# Patient Record
Sex: Female | Born: 1960
Health system: Southern US, Community
[De-identification: ages and names within clinical notes are randomized; demographics above are authoritative.]

## PROBLEM LIST (undated history)

## (undated) DIAGNOSIS — R5383 Other fatigue: Secondary | ICD-10-CM

## (undated) DIAGNOSIS — F17200 Nicotine dependence, unspecified, uncomplicated: Secondary | ICD-10-CM

## (undated) DIAGNOSIS — R059 Cough, unspecified: Secondary | ICD-10-CM

## (undated) DIAGNOSIS — M549 Dorsalgia, unspecified: Secondary | ICD-10-CM

## (undated) DIAGNOSIS — T8859XA Other complications of anesthesia, initial encounter: Secondary | ICD-10-CM

## (undated) DIAGNOSIS — R5381 Other malaise: Secondary | ICD-10-CM

## (undated) DIAGNOSIS — J209 Acute bronchitis, unspecified: Secondary | ICD-10-CM

## (undated) DIAGNOSIS — D7589 Other specified diseases of blood and blood-forming organs: Secondary | ICD-10-CM

## (undated) DIAGNOSIS — G8929 Other chronic pain: Secondary | ICD-10-CM

## (undated) DIAGNOSIS — G4700 Insomnia, unspecified: Secondary | ICD-10-CM

## (undated) DIAGNOSIS — R05 Cough: Secondary | ICD-10-CM

## (undated) DIAGNOSIS — R87810 Cervical high risk human papillomavirus (HPV) DNA test positive: Secondary | ICD-10-CM

## (undated) DIAGNOSIS — T4145XA Adverse effect of unspecified anesthetic, initial encounter: Secondary | ICD-10-CM

## (undated) HISTORY — PX: FRACTURE SURGERY: SHX138

## (undated) HISTORY — DX: Other malaise: R53.81

## (undated) HISTORY — DX: Other specified diseases of blood and blood-forming organs: D75.89

## (undated) HISTORY — DX: Cervical high risk human papillomavirus (HPV) DNA test positive: R87.810

## (undated) HISTORY — DX: Other fatigue: R53.83

## (undated) HISTORY — DX: Cough: R05

## (undated) HISTORY — DX: Cough, unspecified: R05.9

## (undated) HISTORY — DX: Insomnia, unspecified: G47.00

## (undated) HISTORY — PX: TONSILLECTOMY: SUR1361

## (undated) HISTORY — DX: Nicotine dependence, unspecified, uncomplicated: F17.200

## (undated) HISTORY — DX: Acute bronchitis, unspecified: J20.9

---

## 2006-01-21 ENCOUNTER — Emergency Department (HOSPITAL_COMMUNITY): Admission: EM | Admit: 2006-01-21 | Discharge: 2006-01-22 | Payer: Self-pay | Admitting: Emergency Medicine

## 2006-01-22 IMAGING — CR DG FOOT COMPLETE 3+V*L*
3 series · 3 of 3 positions shown · non-contrast
Comparison: none

CLINICAL DATA: Dropped table on top of foot.
LEFT [MN] VIEW:

[view not recorded (1 of 3)]
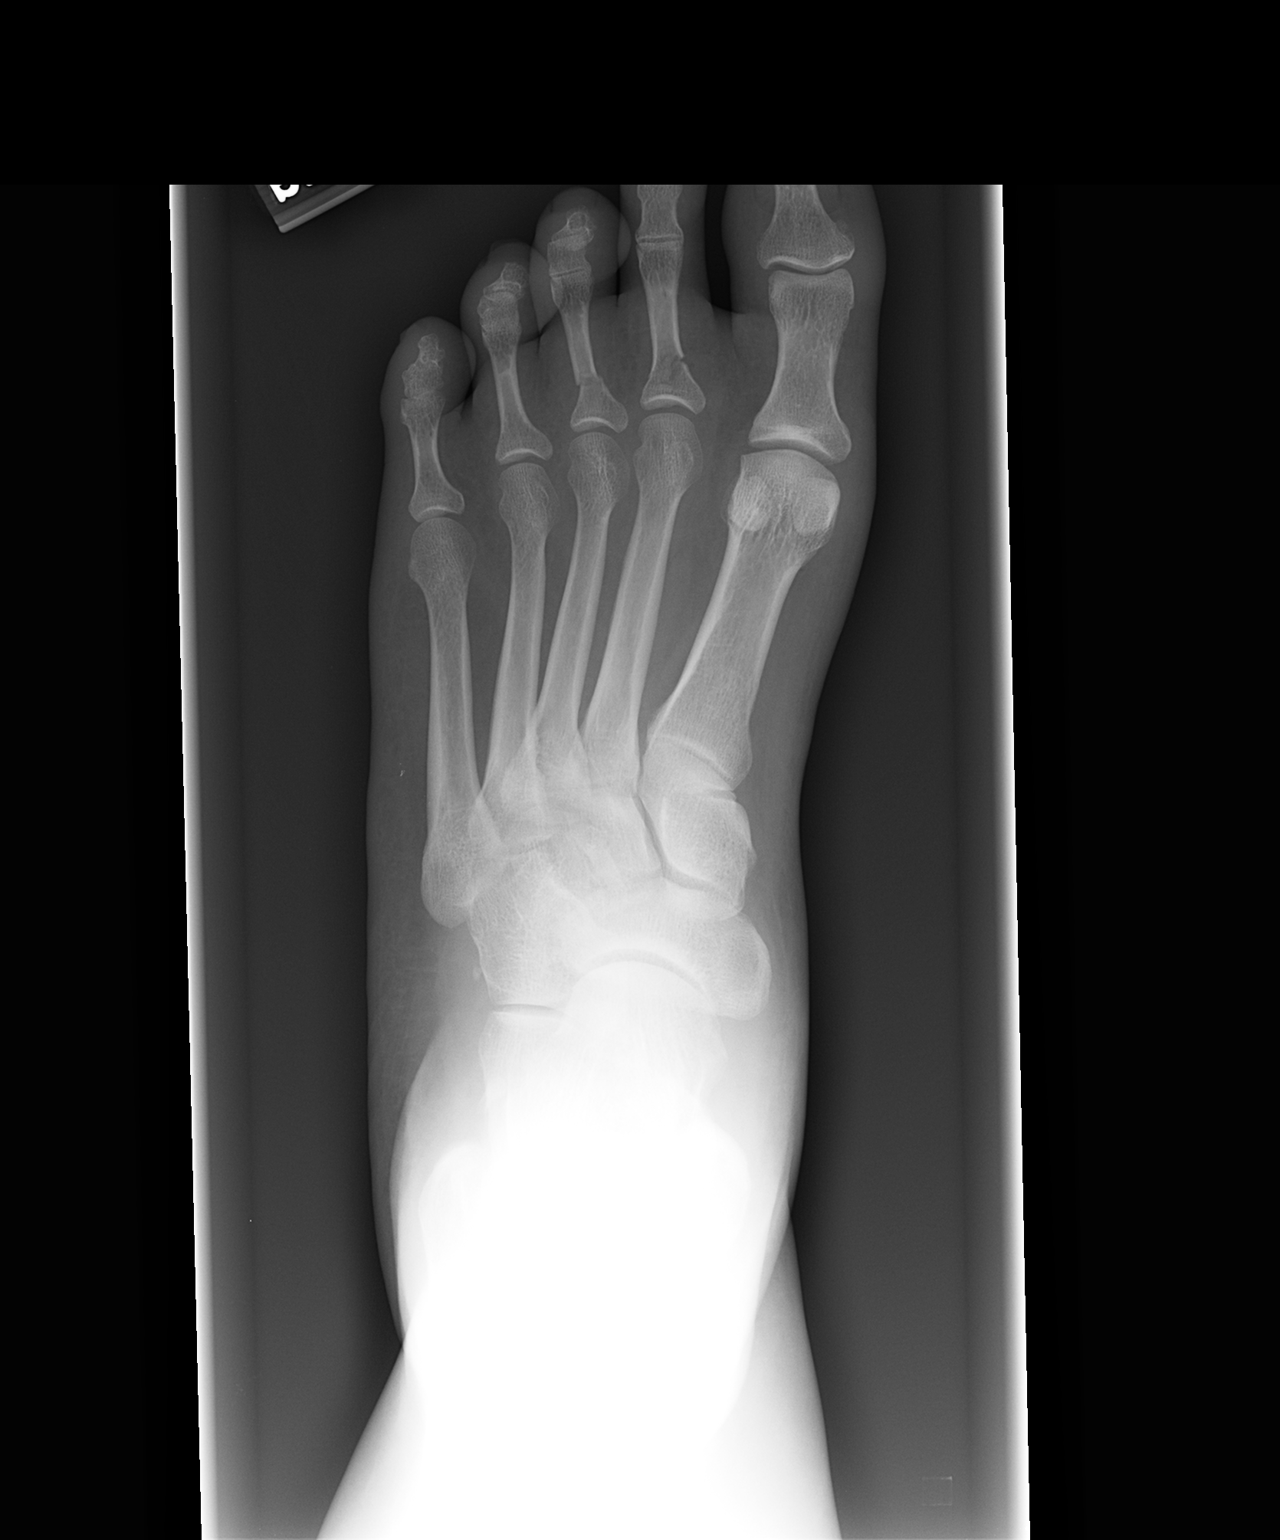

[view not recorded (2 of 3)]
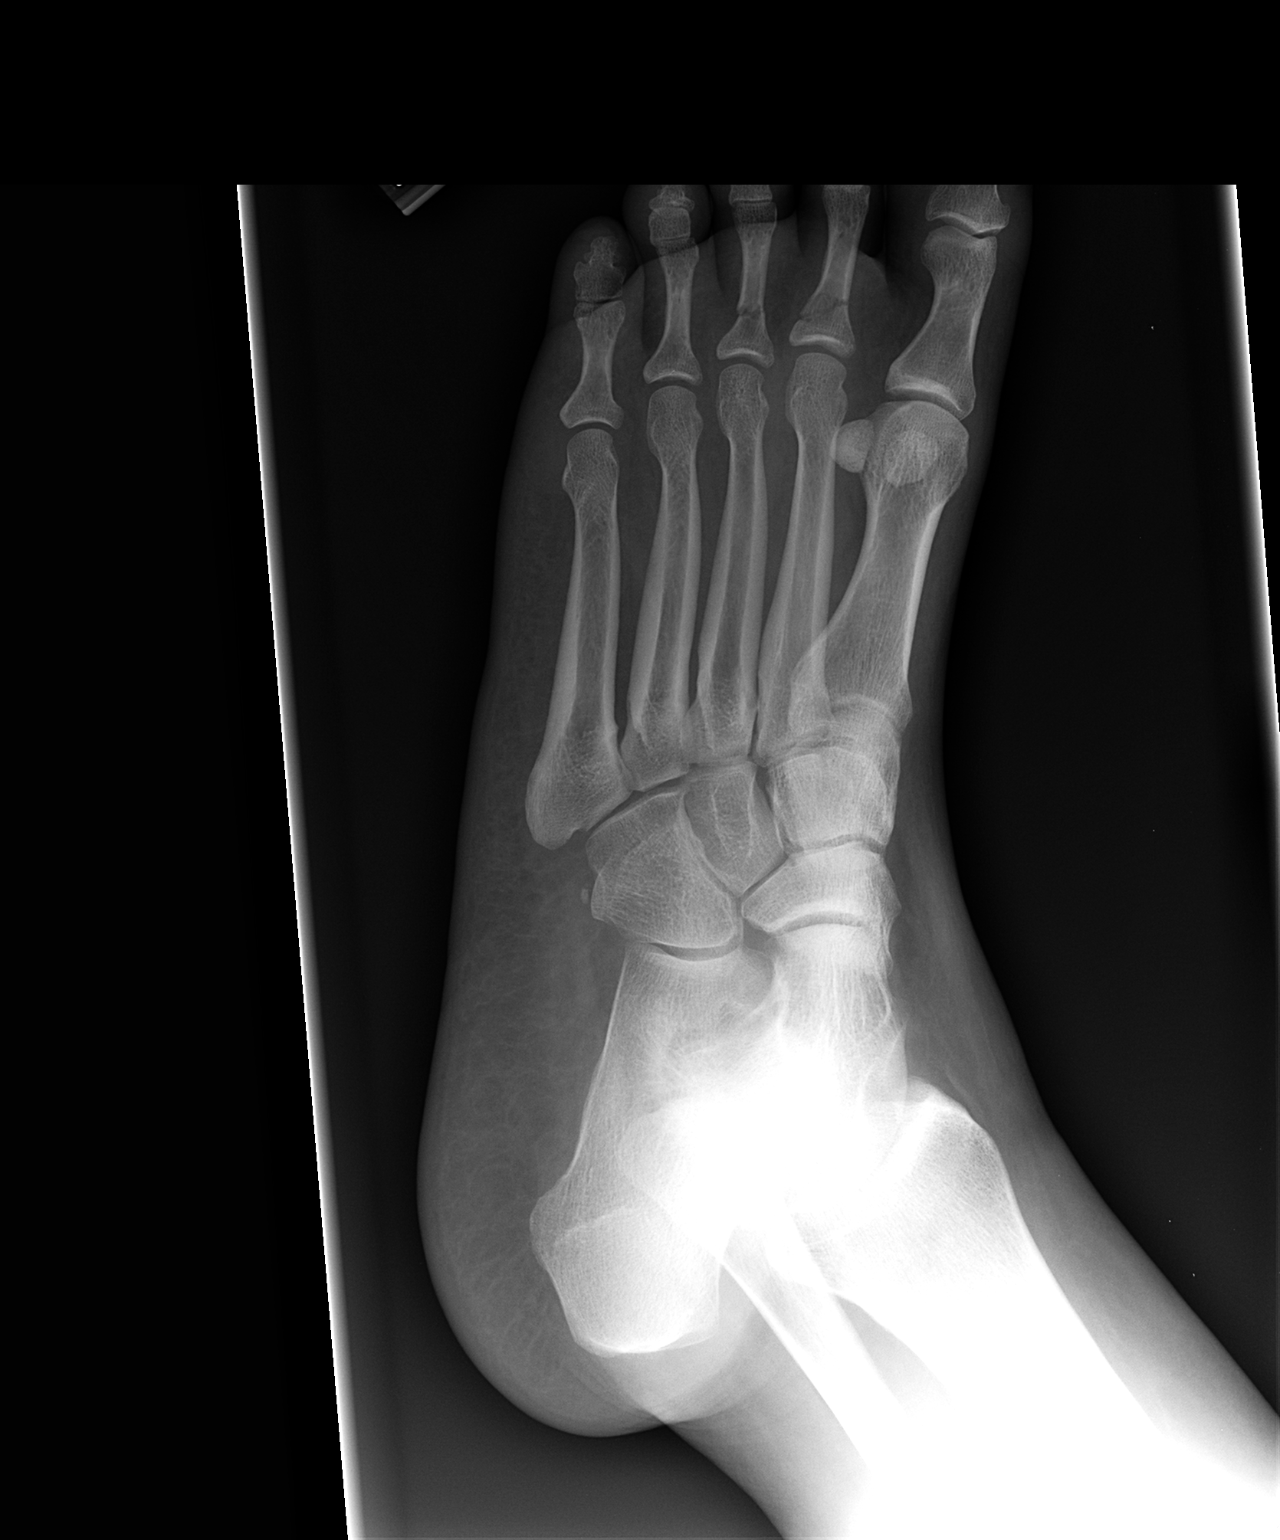

[view not recorded (3 of 3)]
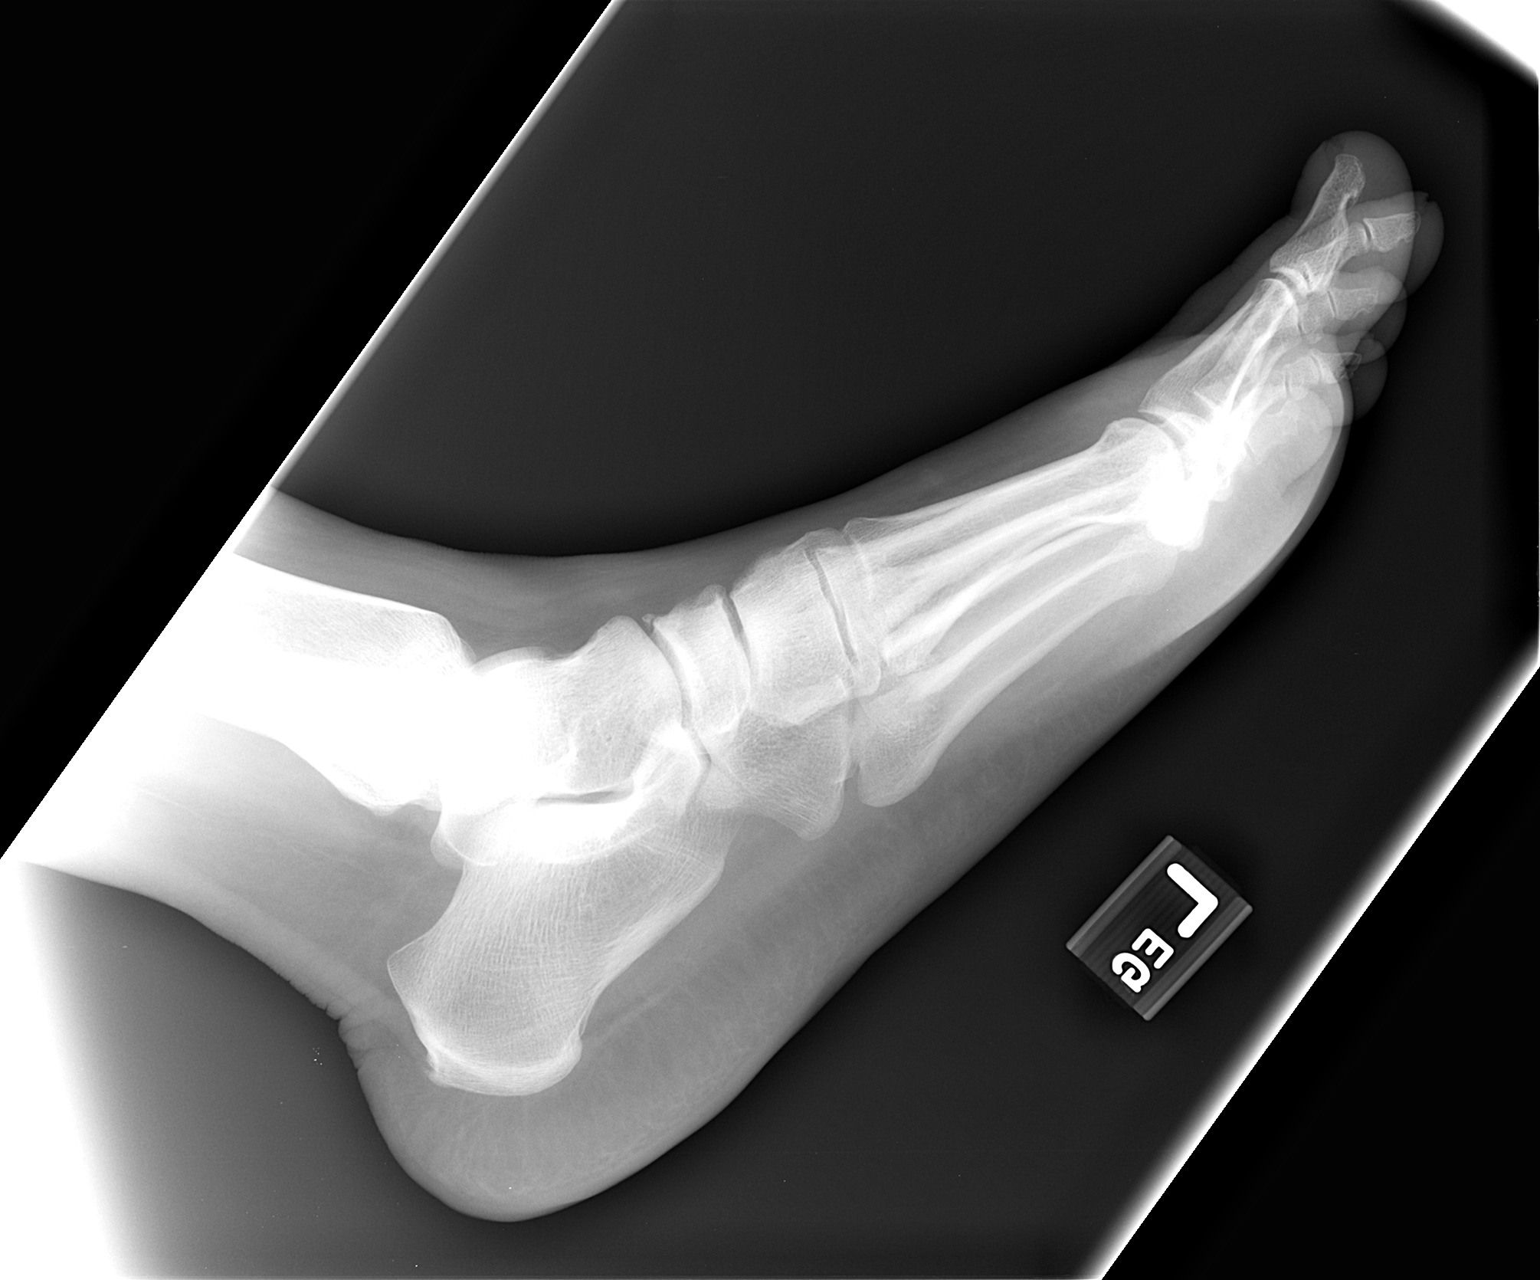

[3 of 3 positions shown; findings below may reference images not displayed]

FINDINGS: There are nondisplaced comminuted fractures through the base of the 2nd and 3rd proximal phalanges.
No additional fractures or dislocations are identified.
IMPRESSION: Proximal 2nd and 3rd phalangeal fractures.

## 2008-04-12 IMAGING — CR DG KNEE COMPLETE 4+V*R*
4 series · 4 of 4 positions shown · non-contrast
Comparison: None

CLINICAL DATA: Fell week ago with pain

RIGHT KNEE - COMPLETE 4+ VIEW

[t knee ap right]
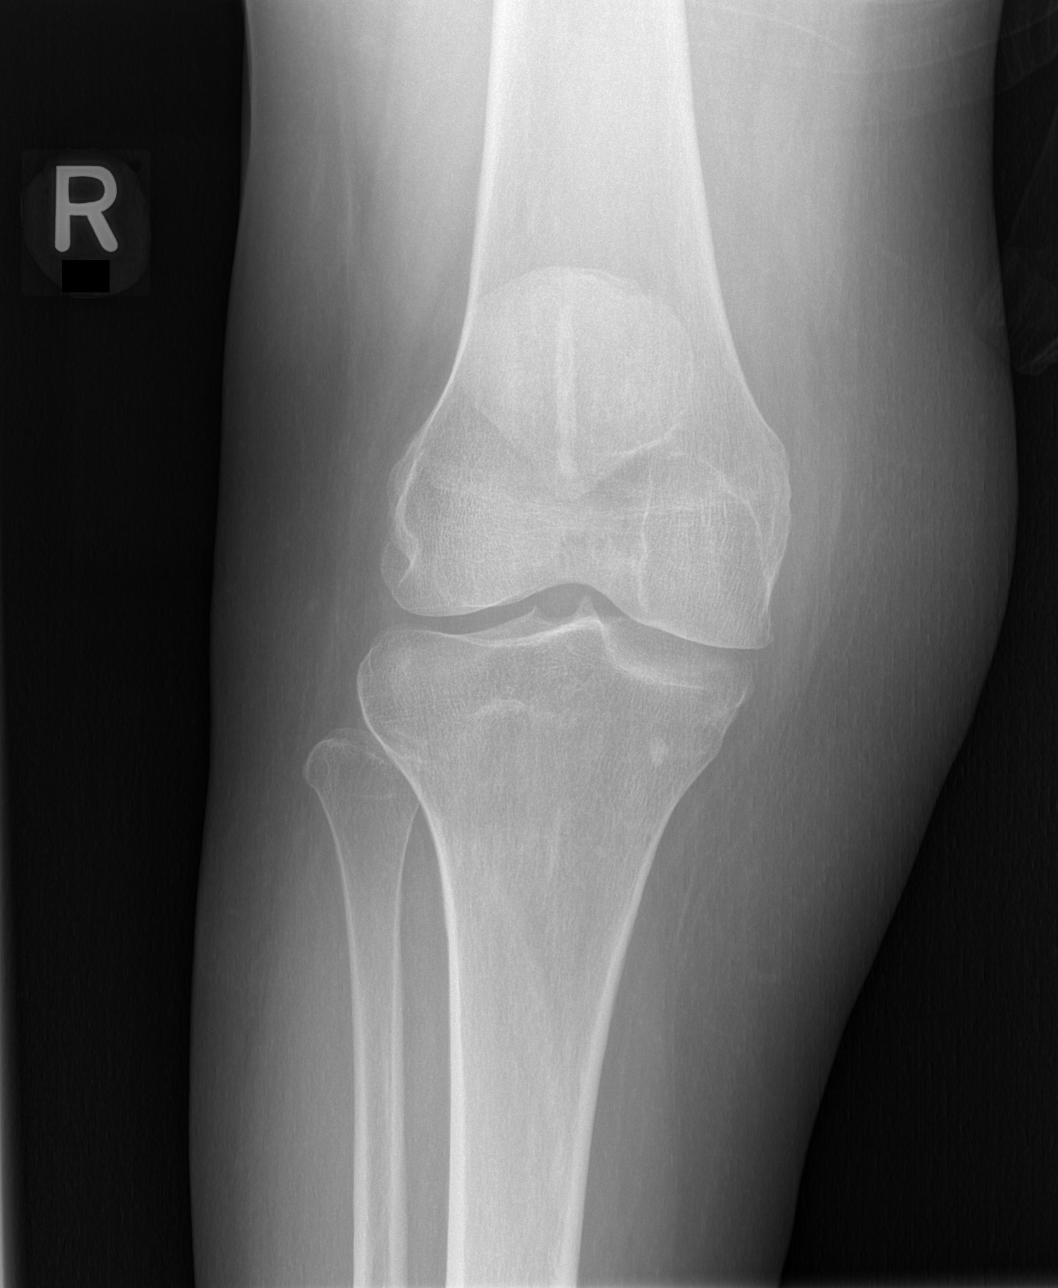

[t knee oblique right (1 of 2)]
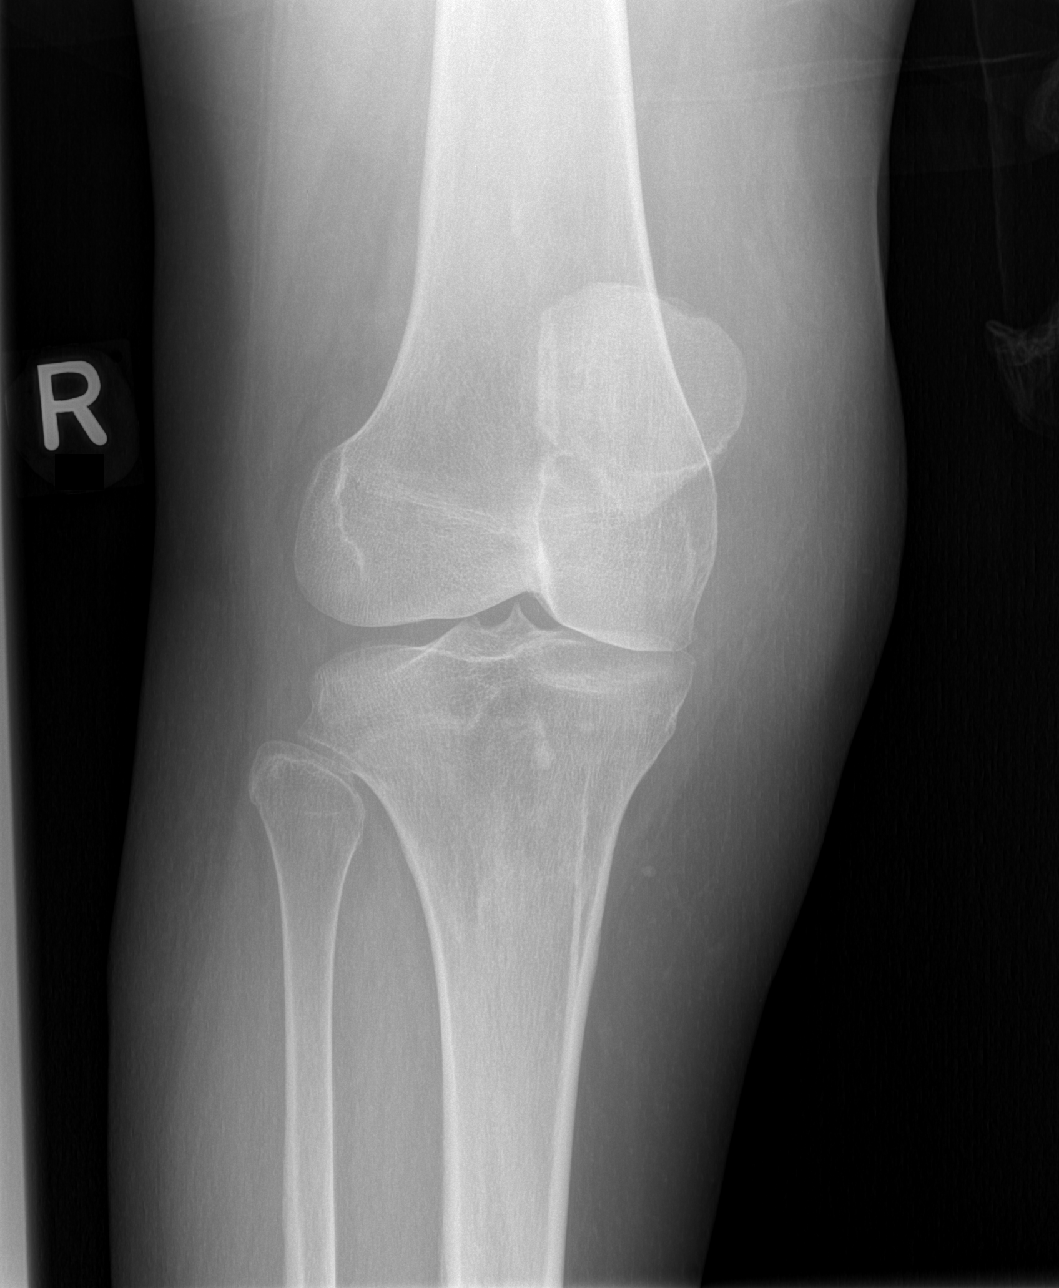

[t knee oblique right (2 of 2)]
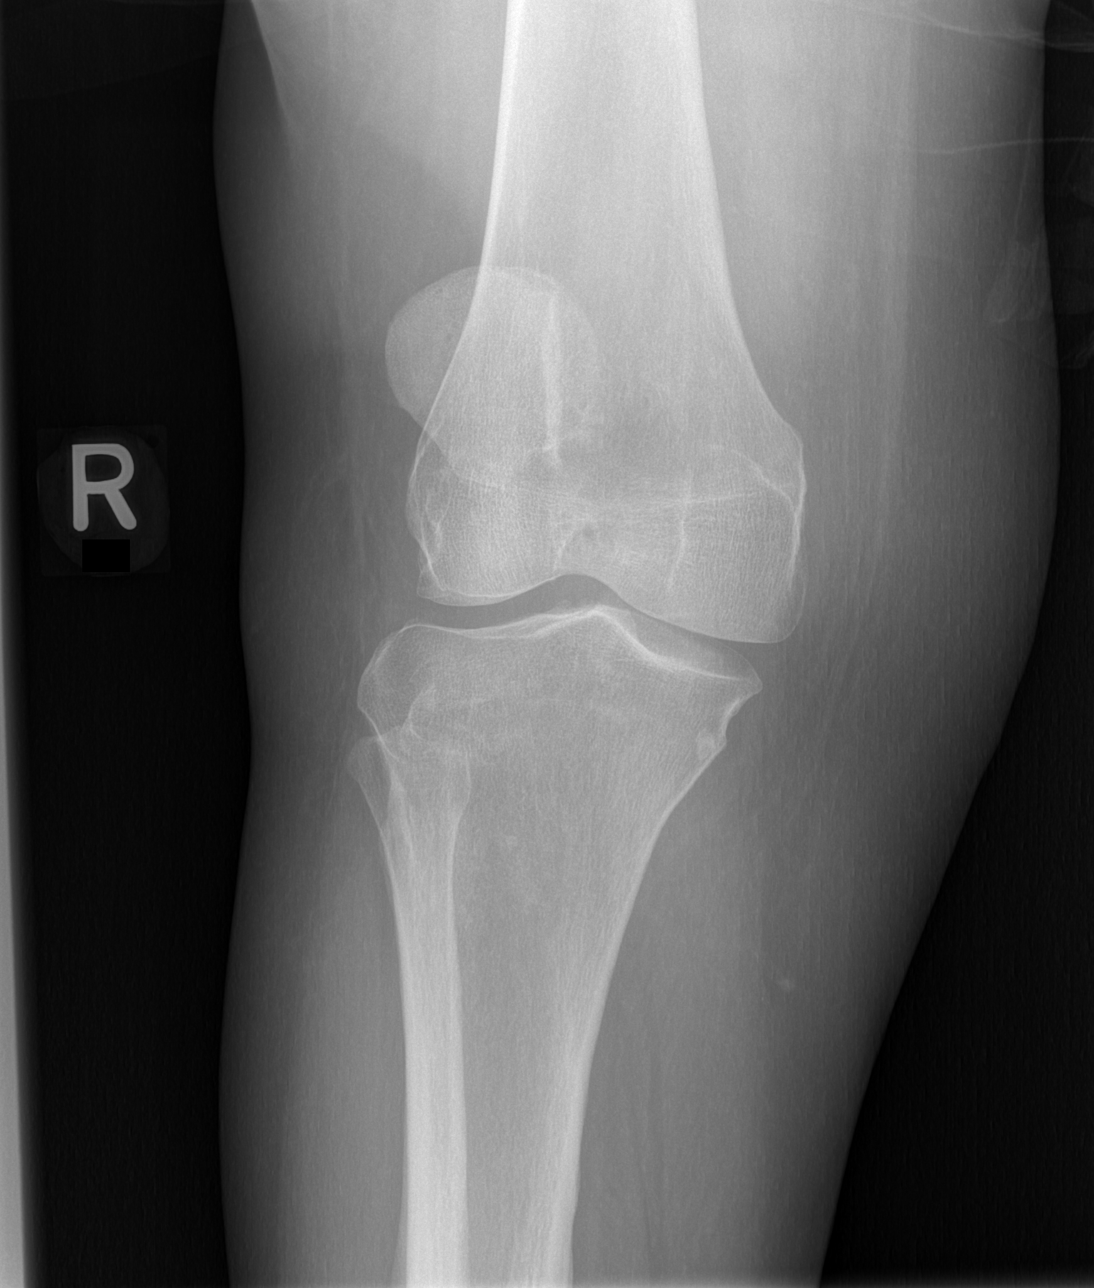

[t knee lat right]
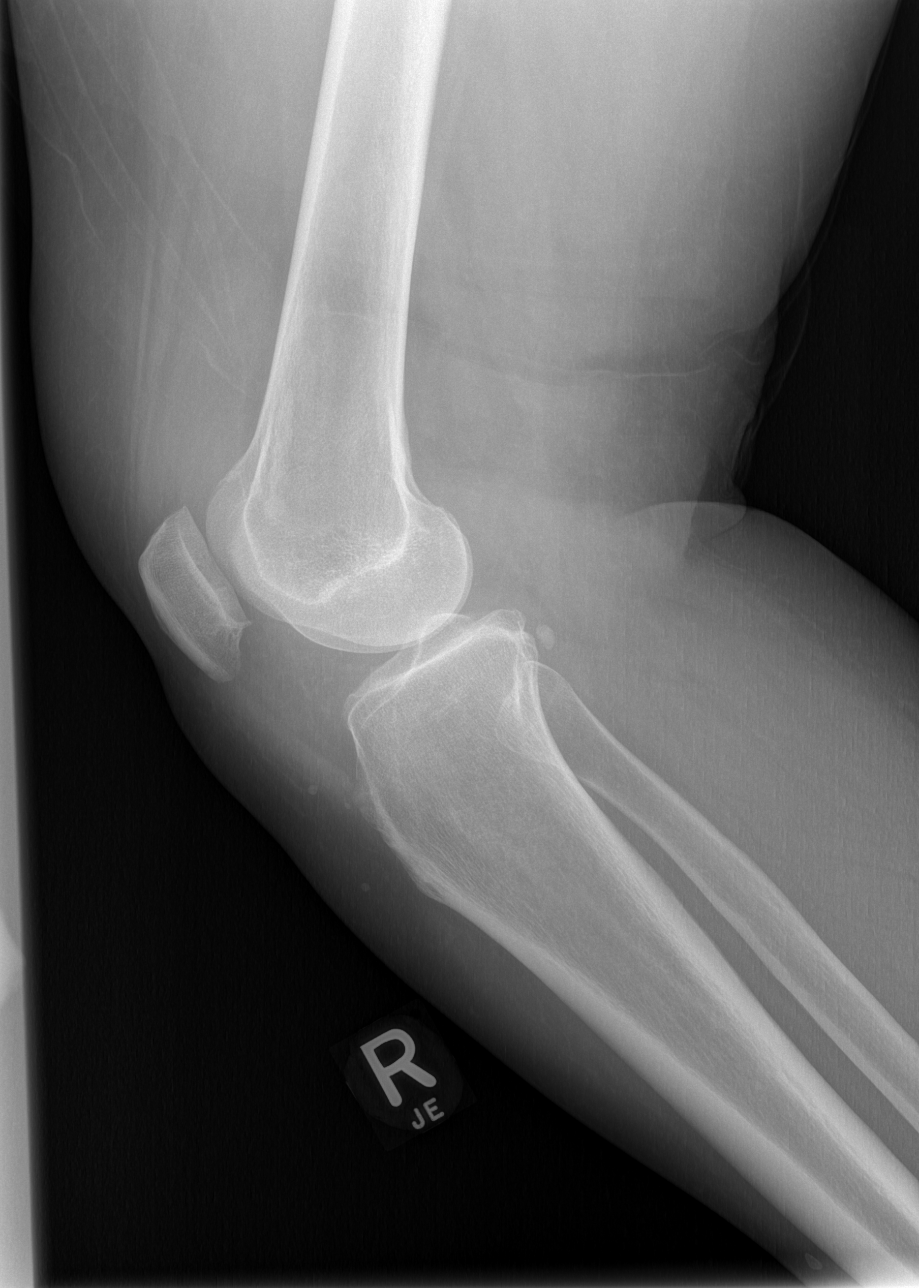

[4 of 4 positions shown; findings below may reference images not displayed]

FINDINGS: No acute fracture is seen.  Joint spaces are within
normal limits.  However there is a moderate sized knee joint
effusion present.
IMPRESSION: No fracture.  Moderate knee joint effusion.

## 2008-09-18 ENCOUNTER — Emergency Department (HOSPITAL_COMMUNITY): Admission: EM | Admit: 2008-09-18 | Discharge: 2008-09-18 | Payer: Self-pay | Admitting: Emergency Medicine

## 2011-05-11 ENCOUNTER — Encounter (HOSPITAL_COMMUNITY): Payer: Self-pay | Admitting: Emergency Medicine

## 2011-05-11 ENCOUNTER — Emergency Department (HOSPITAL_COMMUNITY)
Admission: EM | Admit: 2011-05-11 | Discharge: 2011-05-11 | Disposition: A | Discharge status: Home / Self Care | Payer: Medicare Other | Attending: Emergency Medicine | Admitting: Emergency Medicine

## 2011-05-11 DIAGNOSIS — R059 Cough, unspecified: Secondary | ICD-10-CM | POA: Insufficient documentation

## 2011-05-11 DIAGNOSIS — M549 Dorsalgia, unspecified: Secondary | ICD-10-CM

## 2011-05-11 DIAGNOSIS — R05 Cough: Secondary | ICD-10-CM | POA: Insufficient documentation

## 2011-05-11 DIAGNOSIS — J40 Bronchitis, not specified as acute or chronic: Secondary | ICD-10-CM | POA: Insufficient documentation

## 2011-05-11 HISTORY — DX: Other chronic pain: G89.29

## 2011-05-11 HISTORY — DX: Dorsalgia, unspecified: M54.9

## 2011-05-11 MED ORDER — AZITHROMYCIN 250 MG PO TABS: 250.0000 mg | ORAL_TABLET | Freq: Every day | ORAL | Status: AC

## 2011-05-11 MED ORDER — OXYCODONE-ACETAMINOPHEN 5-325 MG PO TABS
1.0000 | ORAL_TABLET | Freq: Once | ORAL | Status: AC
  Administered 2011-05-11: 1 via ORAL
  Filled 2011-05-11: qty 1

## 2011-05-11 MED ORDER — ETODOLAC 500 MG PO TABS: 500.0000 mg | ORAL_TABLET | Freq: Two times a day (BID) | ORAL | Status: DC

## 2011-05-11 NOTE — ED Notes (Signed)
Pt c/o pain in lower back pain, st's she has had pain for several months.  Has been getting injections at ortho office and also pain meds.  But can't see them again til she has co pay.  Pt st's pain meds were not helping.

## 2011-05-11 NOTE — ED Notes (Signed)
Received pt. From Triage, pt. Ambulatory with cane, gait steady, NAD noted, pt. C/o lower back pain, chest pain with productive cough, green sputum reported by pt.

## 2011-05-11 NOTE — ED Notes (Signed)
Cardiac monitor applied.

## 2011-05-11 NOTE — ED Provider Notes (Signed)
History     CSN: [id number]  Arrival date & time 05/11/11  9562   First MD Initiated Contact with Patient 05/11/11 2231      Chief Complaint  Patient presents with  . Back Pain    (Consider location/radiation/quality/duration/timing/severity/associated sxs/prior treatment) HPI Patient presents emergent complaints of cough that has been ongoing for the last few days. Patient states she's been exposed to someone who has pneumonia. She's been bringing up some yellow sputum. It hurts in her chest when she coughs. She has not had a fever but she does smoke. Patient also mentioned she's been having chronic back pain that has been ongoing for months. She has been seeing another doctor for treatment but could not afford the most recent cocaine. Patient states Ultram is not helping her. She denies any numbness or weakness. No abdominal pain no shortness of breath. Past Medical History  Diagnosis Date  . Chronic back pain     Past Surgical History  Procedure Date  . Fracture surgery   . Tonsillectomy     No family history on file.  History  Substance Use Topics  . Smoking status: Current Everyday Smoker  . Smokeless tobacco: Not on file  . Alcohol Use: Yes     ocassional    OB History    Grav Para Term Preterm Abortions TAB SAB Ect Mult Living                  Review of Systems  All other systems reviewed and are negative.    Allergies  Aspirin and Penicillins  Home Medications   Current Outpatient Rx  Name Route Sig Dispense Refill  . TRAMADOL HCL 50 MG PO TABS Oral Take 50-100 mg by mouth every 4 (four) hours as needed. For pain      BP 130/79  Pulse 81  Temp(Src) 98.5 F (36.9 C) (Oral)  Resp 20  SpO2 98%  Physical Exam  Nursing note and vitals reviewed. Constitutional: She appears well-developed and well-nourished. No distress.  HENT:  Head: Normocephalic and atraumatic.  Right Ear: External ear normal.  Left Ear: External ear normal.  Eyes:  Conjunctivae are normal. Right eye exhibits no discharge. Left eye exhibits no discharge. No scleral icterus.  Neck: Neck supple. No tracheal deviation present.  Cardiovascular: Normal rate, regular rhythm and intact distal pulses.   Pulmonary/Chest: Effort normal. No stridor. No respiratory distress. She has wheezes. She has rales (few wheezes and crackles right base).  Abdominal: Soft. Bowel sounds are normal. She exhibits no distension. There is no tenderness. There is no rebound and no guarding.  Musculoskeletal: She exhibits no edema and no tenderness.  Neurological: She is alert. She has normal strength. No sensory deficit. Cranial nerve deficit:  no gross defecits noted. She exhibits normal muscle tone. She displays no seizure activity. Coordination normal.  Skin: Skin is warm and dry. No rash noted.  Psychiatric: She has a normal mood and affect.    ED Course  Procedures (including critical care time)  Labs Reviewed - No data to display No results found.    MDM  Patient appears in no distress. She has no hypoxia. She's not had any labored breathing. With the slight crackles on the right basal on a course of antibiotics for a presumptive acquired pneumonia. Regarding her back pain I encouraged her to followup with her doctors treating her. I will give her prescription for etodolac in the meantime      Celene Kras, MD  05/11/11 2234 

## 2011-11-02 ENCOUNTER — Encounter (HOSPITAL_COMMUNITY): Payer: Self-pay | Admitting: *Deleted

## 2011-11-02 ENCOUNTER — Emergency Department (HOSPITAL_COMMUNITY)
Admission: EM | Admit: 2011-11-02 | Discharge: 2011-11-02 | Disposition: A | Discharge status: Home Health | Payer: Medicare Other | Attending: Emergency Medicine | Admitting: Emergency Medicine

## 2011-11-02 DIAGNOSIS — F17200 Nicotine dependence, unspecified, uncomplicated: Secondary | ICD-10-CM | POA: Insufficient documentation

## 2011-11-02 DIAGNOSIS — Z9889 Other specified postprocedural states: Secondary | ICD-10-CM | POA: Insufficient documentation

## 2011-11-02 DIAGNOSIS — M25539 Pain in unspecified wrist: Secondary | ICD-10-CM

## 2011-11-02 DIAGNOSIS — Z760 Encounter for issue of repeat prescription: Secondary | ICD-10-CM | POA: Insufficient documentation

## 2011-11-02 MED ORDER — HYDROCODONE-ACETAMINOPHEN 5-500 MG PO TABS: 1.0000 | ORAL_TABLET | Freq: Four times a day (QID) | ORAL | Status: AC | PRN

## 2011-11-02 NOTE — ED Notes (Signed)
Pt is here for medication refill of percocet. Pt had wrist surgery approx 4 weeks ago and does not have followup till the 29th.

## 2011-11-02 NOTE — ED Provider Notes (Signed)
History    This chart was scribed for Laura Roots, MD, MD by Smitty Pluck. The patient was seen in room TR10C and the patient's care was started at 3:17PM.   CSN: [id number]  Arrival date & time 11/02/11  1432   None     Chief Complaint  Patient presents with  . Medication Refill    (Consider location/radiation/quality/duration/timing/severity/associated sxs/prior treatment) The history is provided by the patient.   Laura [name] is a 51 y.o. female who presents to the Emergency Department complaining of medication refill due to running out of medication after left wrist surgery. Pt reports he had surgery 4 weeks ago. He still has constant moderate pain in left wrist. He reports he does not have follow up until July 29. He is right hand dominant. Denies any other pain. Denies radiation. No numbness/weakness. No redness. No fevers. No reinjury or fall.   Past Medical History  Diagnosis Date  . Chronic back pain     Past Surgical History  Procedure Date  . Fracture surgery   . Tonsillectomy     History reviewed. No pertinent family history.  History  Substance Use Topics  . Smoking status: Current Everyday Smoker  . Smokeless tobacco: Not on file  . Alcohol Use: Yes     ocassional    OB History    Grav Para Term Preterm Abortions TAB SAB Ect Mult Living                  Review of Systems  Constitutional: Negative for fever and chills.  Respiratory: Negative for shortness of breath.   Gastrointestinal: Negative for nausea and vomiting.  Neurological: Negative for weakness.    Allergies  Aspirin and Penicillins  Home Medications  No current outpatient prescriptions on file.  BP 148/83  Pulse 99  Temp 98.7 F (37.1 C) (Oral)  Resp 17  SpO2 100%  Physical Exam  Nursing note and vitals reviewed. Constitutional: She is oriented to person, place, and time. She appears well-developed and well-nourished. No distress.  HENT:  Head: Normocephalic and  atraumatic.  Eyes: Conjunctivae are normal.  Neck: Neck supple. No tracheal deviation present.  Cardiovascular: Normal rate.   Pulmonary/Chest: Effort normal. No respiratory distress.  Musculoskeletal: Normal range of motion.       Left wrist w healing surgical wound without sign of infection. Radial pulse 2+. No skin changes or erythema. No sts.   Neurological: She is alert and oriented to person, place, and time.  Skin: Skin is warm and dry.  Psychiatric: She has a normal mood and affect. Her behavior is normal.    ED Course  Procedures (including critical care time) DIAGNOSTIC STUDIES: Oxygen Saturation is 100% on room air, normal by my interpretation.    COORDINATION OF CARE:      MDM  I personally performed the services described in this documentation, which was scribed in my presence. The recorded information has been reviewed and considered. Laura Roots, MD    pts surgical wounds healing without sign of infection.  rx vicodin, discussed need for close f/u w her doctor, incl should she need further med refill.        Laura Roots, MD 11/02/11 [phone]

## 2011-11-02 NOTE — ED Notes (Signed)
Patient states out of pain medication post surgery.  Patient states next appointment with primary MD in August.  Patient with prescription filled on 10/23/11, 40 tablets of Percocet.

## 2012-06-14 ENCOUNTER — Encounter (HOSPITAL_COMMUNITY): Payer: Self-pay | Admitting: Nurse Practitioner

## 2012-06-14 ENCOUNTER — Emergency Department (HOSPITAL_COMMUNITY)
Admission: EM | Admit: 2012-06-14 | Discharge: 2012-06-14 | Disposition: A | Discharge status: Home / Self Care | Payer: Medicare Other | Attending: Emergency Medicine | Admitting: Emergency Medicine

## 2012-06-14 DIAGNOSIS — R5381 Other malaise: Secondary | ICD-10-CM | POA: Insufficient documentation

## 2012-06-14 DIAGNOSIS — G8929 Other chronic pain: Secondary | ICD-10-CM | POA: Insufficient documentation

## 2012-06-14 DIAGNOSIS — F17200 Nicotine dependence, unspecified, uncomplicated: Secondary | ICD-10-CM | POA: Insufficient documentation

## 2012-06-14 DIAGNOSIS — R209 Unspecified disturbances of skin sensation: Secondary | ICD-10-CM | POA: Insufficient documentation

## 2012-06-14 DIAGNOSIS — R202 Paresthesia of skin: Secondary | ICD-10-CM

## 2012-06-14 DIAGNOSIS — F1210 Cannabis abuse, uncomplicated: Secondary | ICD-10-CM

## 2012-06-14 DIAGNOSIS — M549 Dorsalgia, unspecified: Secondary | ICD-10-CM | POA: Insufficient documentation

## 2012-06-14 LAB — COMPREHENSIVE METABOLIC PANEL
Alkaline Phosphatase: 108 U/L (ref 39–117)
BUN: 10 mg/dL (ref 6–23)
CO2: 26 mEq/L (ref 19–32)
Chloride: 99 mEq/L (ref 96–112)
GFR calc Af Amer: >90 mL/min (ref >90)
GFR calc non Af Amer: >90 mL/min (ref >90)
Glucose, Bld: 91 mg/dL (ref 70–99)
Potassium: 4.9 mEq/L (ref 3.5–5.1)
Total Bilirubin: 0.9 mg/dL (ref 0.3–1.2)

## 2012-06-14 LAB — CBC WITH DIFFERENTIAL/PLATELET
Eosinophils Absolute: 0.1 10*3/uL (ref 0.0–0.7)
Hemoglobin: 15.1 g/dL — ABNORMAL HIGH (ref 12.0–15.0)
Lymphs Abs: 1.5 10*3/uL (ref 0.7–4.0)
MCH: 36.3 pg — ABNORMAL HIGH (ref 26.0–34.0)
Monocytes Relative: 7 % (ref 3–12)
Neutro Abs: 2.9 10*3/uL (ref 1.7–7.7)
Neutrophils Relative %: 60 % (ref 43–77)
RBC: 4.16 MIL/uL (ref 3.87–5.11)

## 2012-06-14 LAB — URINALYSIS, MICROSCOPIC ONLY
Bilirubin Urine: NEGATIVE
Ketones, ur: NEGATIVE mg/dL
Nitrite: NEGATIVE
Urobilinogen, UA: 1 mg/dL (ref 0.0–1.0)

## 2012-06-14 LAB — LIPASE, BLOOD: Lipase: 23 U/L (ref 11–59)

## 2012-06-14 LAB — RAPID URINE DRUG SCREEN, HOSP PERFORMED
Barbiturates: NOT DETECTED
Benzodiazepines: NOT DETECTED
Cocaine: NOT DETECTED
Opiates: NOT DETECTED
Tetrahydrocannabinol: POSITIVE — AB

## 2012-06-14 MED ORDER — SODIUM CHLORIDE 0.9 % IV BOLUS (SEPSIS)
1000.0000 mL | Freq: Once | INTRAVENOUS | Status: AC
  Administered 2012-06-14: 1000 mL via INTRAVENOUS

## 2012-06-14 NOTE — ED Notes (Signed)
Per ems: Pt c/o feel fatigue and diarrhea for 2 days. En route, A&Ox4, vss.

## 2012-06-14 NOTE — ED Provider Notes (Signed)
History  This chart was scribed for Loren Racer, MD by Shari Heritage, ED Scribe. The patient was seen in room CD05C/CD05C. Patient's care was started at 1558.   CSN: [id number]  Arrival date & time 06/14/12  1328   First MD Initiated Contact with Patient 06/14/12 1558      Chief Complaint  Patient presents with  . Tingling    Patient is a 52 y.o. female presenting with neurologic complaint. The history is provided by the patient. No language interpreter was used.  Neurologic Problem The primary symptoms include paresthesias. The symptoms began 2 days ago. The symptoms are unchanged. Context: unknown.  Paresthesias began greater than 24 hours ago. The paresthesias are unchanged. The paresthesias are described as pricking.    HPI Comments: Charise Leinbach is a 52 y.o. female who presents to the Emergency Department complaining of diffuse paresthesias onset 2 days ago. Patient states that yesterday morning she began to feel like she was "floating" and that she had "needles sticking" her all over her body including face. She says that she feels like she has been "smoking weed," but denies any recent marijuana use. Patient denies cough. She reports no other symptoms at this time.  She has a history of chronic back pain, but no other pertinent medical history. Per medical records, patient was recently written for Vicodin 5-325 mg. Patient is a current every day smoker.    Past Medical History  Diagnosis Date  . Chronic back pain     Past Surgical History  Procedure Laterality Date  . Fracture surgery    . Tonsillectomy      History reviewed. No pertinent family history.  History  Substance Use Topics  . Smoking status: Current Every Day Smoker  . Smokeless tobacco: Not on file  . Alcohol Use: Yes     Comment: ocassional    OB History   Grav Para Term Preterm Abortions TAB SAB Ect Mult Living                  Review of Systems  Constitutional: Positive for fatigue.   Neurological: Positive for paresthesias.  All other systems reviewed and are negative.    Allergies  Aspirin and Penicillins  Home Medications  No current outpatient prescriptions on file.  Triage Vitals: BP 161/93  Pulse 78  Temp(Src) 97.3 F (36.3 C) (Oral)  Resp 16  SpO2 97%  Physical Exam  Constitutional: She is oriented to person, place, and time. She appears well-developed and well-nourished.  HENT:  Head: Normocephalic and atraumatic.  Mouth/Throat: Mucous membranes are dry.  Mildly dry mucosa. Dry lips. Poor dentition.   Eyes: Conjunctivae and EOM are normal. Pupils are equal, round, and reactive to light.  Neck: Normal range of motion. Neck supple.  Cardiovascular: Normal rate, regular rhythm and normal heart sounds.   Pulmonary/Chest: Effort normal and breath sounds normal.  Abdominal: Soft.  Musculoskeletal: Normal range of motion.  Neurological: She is alert and oriented to person, place, and time.  No focal neurological deficits.   Skin: Skin is warm and dry. No rash noted.    ED Course  Procedures (including critical care time) DIAGNOSTIC STUDIES: Oxygen Saturation is 97% on room air, adequate by my interpretation.    COORDINATION OF CARE: 5:04 PM- Will give 1000 mL of IV fluids. Will order TSH, magnesium, drug screen, CBC with diff, CMP, lipase and UA. Patient informed of current plan for treatment and evaluation and agrees with plan at this time.  8:04 PM- Drug screen positive for tetrahydrocannabinol and suspect symptoms are related to marijuana use. Have also ordered TSH which is pending. Patient instructed to follow up with PCP for results.    Labs Reviewed  CBC WITH DIFFERENTIAL - Abnormal; Notable for the following:    Hemoglobin 15.1 (*)    MCV 102.4 (*)    MCH 36.3 (*)    Basophils Relative 2 (*)    All other components within normal limits  COMPREHENSIVE METABOLIC PANEL - Abnormal; Notable for the following:    Total Protein 8.7 (*)     All other components within normal limits  URINALYSIS, MICROSCOPIC ONLY - Abnormal; Notable for the following:    Leukocytes, UA TRACE (*)    Squamous Epithelial / LPF FEW (*)    All other components within normal limits  URINE RAPID DRUG SCREEN (HOSP PERFORMED) - Abnormal; Notable for the following:    Tetrahydrocannabinol POSITIVE (*)    All other components within normal limits  LIPASE, BLOOD  MAGNESIUM  TSH     1. Paresthesia   2. Marijuana abuse       MDM  I personally performed the services described in this documentation, which was scribed in my presence. The recorded information has been reviewed and is accurate.    Loren Racer, MD 06/15/12 [phone]

## 2012-06-14 NOTE — ED Notes (Signed)
Requested pt provide urine sample; however, pt states she is unable to void.

## 2012-07-28 ENCOUNTER — Encounter: Payer: Self-pay | Admitting: Geriatric Medicine

## 2012-07-28 ENCOUNTER — Ambulatory Visit (INDEPENDENT_AMBULATORY_CARE_PROVIDER_SITE_OTHER): Payer: Medicare Other | Admitting: Nurse Practitioner

## 2012-07-28 ENCOUNTER — Encounter: Payer: Self-pay | Admitting: Nurse Practitioner

## 2012-07-28 VITALS — BP 138/86 | HR 73 | Temp 98.3°F | Resp 15 | Ht 67.0 in | Wt 222.2 lb

## 2012-07-28 DIAGNOSIS — IMO0002 Reserved for concepts with insufficient information to code with codable children: Secondary | ICD-10-CM

## 2012-07-28 DIAGNOSIS — G4700 Insomnia, unspecified: Secondary | ICD-10-CM | POA: Insufficient documentation

## 2012-07-28 DIAGNOSIS — F32A Depression, unspecified: Secondary | ICD-10-CM

## 2012-07-28 DIAGNOSIS — F3289 Other specified depressive episodes: Secondary | ICD-10-CM

## 2012-07-28 DIAGNOSIS — Z13220 Encounter for screening for lipoid disorders: Secondary | ICD-10-CM

## 2012-07-28 DIAGNOSIS — M179 Osteoarthritis of knee, unspecified: Secondary | ICD-10-CM

## 2012-07-28 DIAGNOSIS — M1710 Unilateral primary osteoarthritis, unspecified knee: Secondary | ICD-10-CM

## 2012-07-28 DIAGNOSIS — F329 Major depressive disorder, single episode, unspecified: Secondary | ICD-10-CM

## 2012-07-28 MED ORDER — TRAZODONE HCL 50 MG PO TABS: 50.0000 mg | ORAL_TABLET | Freq: Every day | ORAL | Status: DC

## 2012-07-28 MED ORDER — HYDROCODONE-ACETAMINOPHEN 5-325 MG PO TABS: ORAL_TABLET | ORAL | Status: DC

## 2012-07-28 NOTE — Assessment & Plan Note (Signed)
Improved with recent increase of pain medication.

## 2012-07-28 NOTE — Assessment & Plan Note (Signed)
Unchanged-- Hopefully the start of trazodone will help with this- may need to titrate up to achieve therapeutic results for depression

## 2012-07-28 NOTE — Assessment & Plan Note (Signed)
Ambien helped pt get to sleep but not stay asleep will stop Ambien and start trazodone 50mg  qhs

## 2012-07-28 NOTE — Progress Notes (Signed)
Patient ID: Laura [name], female   DOB: 09/06/1960, 52 y.o.   MRN: [id number]   Allergies  Allergen Reactions  . Aspirin Hives  . Penicillins Hives    Chief Complaint  Patient presents with  . Medical Managment of Chronic Issues    HPI: Patient is a 52 y.o. female seen in the office today for follow up on pain and insomnia Recently started Ambien and increased Vicodin to 1-2 tablet every 6 hours She has been taking 2 tablets every 6 hours and reports the pain is still there. Reports it does make the pain more tolerable. Ortho said she was a good candidate for surgery but she does not wish to have this done.   Ambien is not keeping her asleep all night- she reports she is able to get to sleep better but not able to stay asleep. Has not tired any other medication to help with sleep.   Review of Systems:  Review of Systems  Constitutional: Negative for fever, chills and malaise/fatigue.  Respiratory: Negative for cough and shortness of breath.   Cardiovascular: Negative for chest pain and palpitations.  Gastrointestinal: Positive for constipation (but uses stool softners as needed). Negative for heartburn, nausea, vomiting and diarrhea.  Neurological: Positive for weakness (due to pain in knees ).  Psychiatric/Behavioral: Positive for depression (depressed at times). Negative for suicidal ideas. The patient is nervous/anxious (at times). The patient does not have insomnia.      Past Medical History  Diagnosis Date  . Chronic back pain   . Other specified diseases of blood and blood-forming organs   . Tobacco use disorder   . Insomnia, unspecified   . Acute bronchitis   . Other malaise and fatigue   . Cough    Past Surgical History  Procedure Laterality Date  . Fracture surgery    . Tonsillectomy     Social History:   reports that she has been smoking.  She does not have any smokeless tobacco history on file. She reports that  drinks alcohol. She reports that she does not use  illicit drugs.  Family History  Problem Relation Age of Onset  . Arthritis Mother   . Hypertension Mother   . Hypertension Sister   . Hypertension Brother   . Hypertension Sister   . Hypertension Sister   . Hypertension Sister     Medications: Patient's Medications  New Prescriptions   No medications on file  Previous Medications   ALBUTEROL (PROVENTIL HFA;VENTOLIN HFA) 108 (90 BASE) MCG/ACT INHALER    Inhale 2 puffs into the lungs every 6 (six) hours as needed for wheezing. For shortness of breath   HYDROCODONE-ACETAMINOPHEN (NORCO/VICODIN) 5-325 MG PER TABLET    Take 1 tablet by mouth every 6 (six) hours as needed for pain. For pain   ZOLPIDEM (AMBIEN) 10 MG TABLET    Take one tablet once at bedtime for insomnia  Modified Medications   No medications on file  Discontinued Medications   DOXYCYCLINE (VIBRA-TABS) 100 MG TABLET         Physical Exam: Physical Exam  Filed Vitals:   07/28/12 1104  BP: 138/86  Pulse: 73  Temp: 98.3 F (36.8 C)  TempSrc: Oral  Resp: 15  Height: 5\' 7"  (1.702 m)  Weight: 222 lb 3.2 oz (100.789 kg)      Labs reviewed: Basic Metabolic Panel:  Recent Labs  30/86/57 1607 06/14/12 1741  NA 138  --   K 4.9  --   CL 99  --  CO2 26  --   GLUCOSE 91  --   BUN 10  --   CREATININE 0.53  --   CALCIUM 10.2  --   MG  --  2.1  TSH  --  2.159   Liver Function Tests:  Recent Labs  06/14/12 1607  AST 35  ALT 20  ALKPHOS 108  BILITOT 0.9  PROT 8.7*  ALBUMIN 4.2    Recent Labs  06/14/12 1607  LIPASE 23  CBC:   Recent Labs  06/14/12 1607  WBC 4.9  NEUTROABS 2.9  HGB 15.1*  HCT 42.6  MCV 102.4*  PLT 178    Assessment/Plan No problem-specific assessment & plan notes found for this encounter.    Labs/tests ordered

## 2012-07-28 NOTE — Patient Instructions (Addendum)
Come to lab in 1 month for fasting lipids when you pick up Vicodin prescription next month     Insomnia Insomnia is frequent trouble falling and/or staying asleep. Insomnia can be a long term problem or a short term problem. Both are common. Insomnia can be a short term problem when the wakefulness is related to a certain stress or worry. Long term insomnia is often related to ongoing stress during waking hours and/or poor sleeping habits. Overtime, sleep deprivation itself can make the problem worse. Every little thing feels more severe because you are overtired and your ability to cope is decreased. CAUSES   Stress, anxiety, and depression.  Poor sleeping habits.  Distractions such as TV in the bedroom.  Naps close to bedtime.  Engaging in emotionally charged conversations before bed.  Technical reading before sleep.  Alcohol and other sedatives. They may make the problem worse. They can hurt normal sleep patterns and normal dream activity.  Stimulants such as caffeine for several hours prior to bedtime.  Pain syndromes and shortness of breath can cause insomnia.  Exercise late at night.  Changing time zones may cause sleeping problems (jet lag). It is sometimes helpful to have someone observe your sleeping patterns. They should look for periods of not breathing during the night (sleep apnea). They should also look to see how long those periods last. If you live alone or observers are uncertain, you can also be observed at a sleep clinic where your sleep patterns will be professionally monitored. Sleep apnea requires a checkup and treatment. Give your caregivers your medical history. Give your caregivers observations your family has made about your sleep.  SYMPTOMS   Not feeling rested in the morning.  Anxiety and restlessness at bedtime.  Difficulty falling and staying asleep. TREATMENT   Your caregiver may prescribe treatment for an underlying medical disorders. Your  caregiver can give advice or help if you are using alcohol or other drugs for self-medication. Treatment of underlying problems will usually eliminate insomnia problems.  Medications can be prescribed for short time use. They are generally not recommended for lengthy use.  Over-the-counter sleep medicines are not recommended for lengthy use. They can be habit forming.  You can promote easier sleeping by making lifestyle changes such as:  Using relaxation techniques that help with breathing and reduce muscle tension.  Exercising earlier in the day.  Changing your diet and the time of your last meal. No night time snacks.  Establish a regular time to go to bed.  Counseling can help with stressful problems and worry.  Soothing music and white noise may be helpful if there are background noises you cannot remove.  Stop tedious detailed work at least one hour before bedtime. HOME CARE INSTRUCTIONS   Keep a diary. Inform your caregiver about your progress. This includes any medication side effects. See your caregiver regularly. Take note of:  Times when you are asleep.  Times when you are awake during the night.  The quality of your sleep.  How you feel the next day. This information will help your caregiver care for you.  Get out of bed if you are still awake after 15 minutes. Read or do some quiet activity. Keep the lights down. Wait until you feel sleepy and go back to bed.  Keep regular sleeping and waking hours. Avoid naps.  Exercise regularly.  Avoid distractions at bedtime. Distractions include watching television or engaging in any intense or detailed activity like attempting to balance the household  checkbook.  Develop a bedtime ritual. Keep a familiar routine of bathing, brushing your teeth, climbing into bed at the same time each night, listening to soothing music. Routines increase the success of falling to sleep faster.  Use relaxation techniques. This can be using  breathing and muscle tension release routines. It can also include visualizing peaceful scenes. You can also help control troubling or intruding thoughts by keeping your mind occupied with boring or repetitive thoughts like the old concept of counting sheep. You can make it more creative like imagining planting one beautiful flower after another in your backyard garden.  During your day, work to eliminate stress. When this is not possible use some of the previous suggestions to help reduce the anxiety that accompanies stressful situations. MAKE SURE YOU:   Understand these instructions.  Will watch your condition.  Will get help right away if you are not doing well or get worse. Document Released: 04/03/2000 Document Revised: 06/29/2011 Document Reviewed: 05/04/2007 [facility] Patient Information 2013 [place], [place].

## 2012-08-30 ENCOUNTER — Other Ambulatory Visit: Payer: Medicare Other

## 2012-08-30 ENCOUNTER — Other Ambulatory Visit: Payer: Self-pay | Admitting: *Deleted

## 2012-08-30 DIAGNOSIS — Z13220 Encounter for screening for lipoid disorders: Secondary | ICD-10-CM

## 2012-08-30 MED ORDER — HYDROCODONE-ACETAMINOPHEN 5-325 MG PO TABS: ORAL_TABLET | ORAL | Status: DC

## 2012-08-31 LAB — LIPID PANEL
Chol/HDL Ratio: 1.8 ratio units (ref 0.0–4.4)
LDL Calculated: 61 mg/dL (ref 0–99)
Triglycerides: 67 mg/dL (ref 0–149)
VLDL Cholesterol Cal: 13 mg/dL (ref 5–40)

## 2012-09-05 ENCOUNTER — Ambulatory Visit (INDEPENDENT_AMBULATORY_CARE_PROVIDER_SITE_OTHER): Payer: Medicare Other | Admitting: Internal Medicine

## 2012-09-05 ENCOUNTER — Encounter: Payer: Self-pay | Admitting: Internal Medicine

## 2012-09-05 VITALS — BP 124/82 | HR 73 | Temp 98.1°F | Resp 20 | Ht 66.0 in | Wt 223.0 lb

## 2012-09-05 DIAGNOSIS — F410 Panic disorder [episodic paroxysmal anxiety] without agoraphobia: Secondary | ICD-10-CM

## 2012-09-05 MED ORDER — ALPRAZOLAM 0.25 MG PO TABS: 0.2500 mg | ORAL_TABLET | Freq: Every day | ORAL | Status: DC | PRN

## 2012-09-05 NOTE — Progress Notes (Signed)
Patient ID: Laura [name], female   DOB: 10/09/1960, 52 y.o.   MRN: [id number]  Allergies  Allergen Reactions  . Aspirin Hives  . Penicillins Hives    Chief Complaint  Patient presents with  . Acute Visit    anxiety attacks    HPI: Patient is a 52 y.o. AA female seen in the office today for an acute visit for increased anxiety. Having frequent panic attacks.  Has had to call rescue squad twice.  No clear trigger for them.  Heart beats so fast her veins on her hands are standing up.  Once at 4am, got chills and shakes out of nowhere.  Denies a lot of stress in her life at present.  Trazodone gave her nightmares--dreamt about dead people and felt like people were coming and sitting on the bed and standing up again.  Ambien makes her restless.  Took antidepressants a long time ago.  Also took anxiety pills before--took xanax.    Review of Systems:  Review of Systems  Constitutional: Positive for malaise/fatigue.  Respiratory: Negative for shortness of breath.   Cardiovascular: Negative for chest pain.  Musculoskeletal: Positive for back pain.  Neurological: Negative for weakness.  Psychiatric/Behavioral: Positive for depression. Negative for hallucinations, memory loss and substance abuse. The patient is nervous/anxious and has insomnia.      Past Medical History  Diagnosis Date  . Chronic back pain   . Other specified diseases of blood and blood-forming organs   . Tobacco use disorder   . Insomnia, unspecified   . Acute bronchitis   . Other malaise and fatigue   . Cough    Past Surgical History  Procedure Laterality Date  . Fracture surgery    . Tonsillectomy     Social History:   reports that she has been smoking.  She does not have any smokeless tobacco history on file. She reports that  drinks alcohol. She reports that she does not use illicit drugs.  Family History  Problem Relation Age of Onset  . Arthritis Mother   . Hypertension Mother   . Hypertension Sister   .  Hypertension Brother   . Hypertension Sister   . Hypertension Sister   . Hypertension Sister     Medications: Patient's Medications  New Prescriptions   ALPRAZOLAM (XANAX) 0.25 MG TABLET    Take 1 tablet (0.25 mg total) by mouth daily as needed for anxiety.  Previous Medications   ALBUTEROL (PROVENTIL HFA;VENTOLIN HFA) 108 (90 BASE) MCG/ACT INHALER    Inhale 2 puffs into the lungs every 6 (six) hours as needed for wheezing. For shortness of breath   HYDROCODONE-ACETAMINOPHEN (NORCO/VICODIN) 5-325 MG PER TABLET    1-2 tablets by mouth every 6 hours as needed for pain  Modified Medications   No medications on file  Discontinued Medications   TRAZODONE (DESYREL) 50 MG TABLET    Take 1 tablet (50 mg total) by mouth at bedtime.     Physical Exam:  Filed Vitals:   09/05/12 1616  BP: 124/82  Pulse: 73  Temp: 98.1 F (36.7 C)  TempSrc: Oral  Resp: 20  Height: 5\' 6"  (1.676 m)  Weight: 223 lb (101.152 kg)  SpO2: 95%  Physical Exam  Constitutional: She is oriented to person, place, and time.  Obese AA female, NAD  Cardiovascular: Normal rate, regular rhythm and normal heart sounds.   Pulmonary/Chest: Effort normal and breath sounds normal. No respiratory distress.  Neurological: She is alert and oriented to person, place, and  time.  Skin: Skin is warm and dry.  Psychiatric:  Flat affect, appears annoyed to wait   Assessment/Plan Panic attack Says she has had 2 panic attacks recently where she's had to call EMS.  Requests something to take in these instances.  Did not want an antidepressant to stave them off.  Discussed risks of benzodiazepines in long term on cognition, also discussed that they should not be combined with narcotics due to risk of overdose.  Lowest dose of xanax prescribed 0.25mg  daily prn panic attacks.  I only gave her 20 pills.  She says she will only take them if she gets a panic attack.

## 2012-09-05 NOTE — Assessment & Plan Note (Addendum)
Says she has had 2 panic attacks recently where she's had to call EMS.  Requests something to take in these instances.  Did not want an antidepressant to stave them off.  Discussed risks of benzodiazepines in long term on cognition, also discussed that they should not be combined with narcotics due to risk of overdose.  Lowest dose of xanax prescribed 0.25mg  daily prn panic attacks.  I only gave her 20 pills.  She says she will only take them if she gets a panic attack.

## 2012-10-03 ENCOUNTER — Other Ambulatory Visit: Payer: Self-pay | Admitting: Geriatric Medicine

## 2012-10-03 MED ORDER — HYDROCODONE-ACETAMINOPHEN 5-325 MG PO TABS: ORAL_TABLET | ORAL | Status: DC

## 2012-10-06 ENCOUNTER — Other Ambulatory Visit: Payer: Self-pay | Admitting: Geriatric Medicine

## 2012-10-06 DIAGNOSIS — F410 Panic disorder [episodic paroxysmal anxiety] without agoraphobia: Secondary | ICD-10-CM

## 2012-10-06 MED ORDER — ALPRAZOLAM 0.25 MG PO TABS: 0.2500 mg | ORAL_TABLET | Freq: Every day | ORAL | Status: DC | PRN

## 2012-11-02 ENCOUNTER — Other Ambulatory Visit: Payer: Self-pay | Admitting: Geriatric Medicine

## 2012-11-02 MED ORDER — HYDROCODONE-ACETAMINOPHEN 5-325 MG PO TABS: ORAL_TABLET | ORAL | Status: DC

## 2012-11-08 ENCOUNTER — Other Ambulatory Visit: Payer: Self-pay | Admitting: Geriatric Medicine

## 2012-11-08 DIAGNOSIS — F410 Panic disorder [episodic paroxysmal anxiety] without agoraphobia: Secondary | ICD-10-CM

## 2012-11-08 MED ORDER — ALPRAZOLAM 0.25 MG PO TABS: 0.2500 mg | ORAL_TABLET | Freq: Every day | ORAL | Status: DC | PRN

## 2012-11-10 ENCOUNTER — Ambulatory Visit: Payer: Medicare Other | Admitting: Nurse Practitioner

## 2012-11-10 ENCOUNTER — Encounter: Payer: Self-pay | Admitting: Internal Medicine

## 2012-11-10 ENCOUNTER — Ambulatory Visit (INDEPENDENT_AMBULATORY_CARE_PROVIDER_SITE_OTHER): Payer: Medicare Other | Admitting: Internal Medicine

## 2012-11-10 VITALS — BP 110/70 | HR 81 | Temp 97.9°F | Resp 18 | Ht 66.0 in | Wt 220.6 lb

## 2012-11-10 DIAGNOSIS — M5430 Sciatica, unspecified side: Secondary | ICD-10-CM

## 2012-11-10 DIAGNOSIS — M5441 Lumbago with sciatica, right side: Secondary | ICD-10-CM

## 2012-11-10 DIAGNOSIS — F410 Panic disorder [episodic paroxysmal anxiety] without agoraphobia: Secondary | ICD-10-CM

## 2012-11-10 MED ORDER — OXYCODONE-ACETAMINOPHEN 10-325 MG PO TABS: 1.0000 | ORAL_TABLET | Freq: Four times a day (QID) | ORAL | Status: DC | PRN

## 2012-11-10 MED ORDER — ALPRAZOLAM 0.5 MG PO TABS: 0.5000 mg | ORAL_TABLET | Freq: Every day | ORAL | Status: DC | PRN

## 2012-11-10 NOTE — Progress Notes (Signed)
Patient ID: Laura [name], female   DOB: [birth year], 52 y.o.   MRN: [id number] Location:  [facility]   Allergies  Allergen Reactions  . Aspirin Hives  . Penicillins Hives    Chief Complaint  Patient presents with  . Follow-up    HPI: Patient is a 52 y.o. black female seen in the office today for f/u of chronic conditions.  No new complaints.  Says she is here.   Says vicodin is not working that well since last time--is using 2 every 6 hours.  Says she can barely walk across the street due to pain.  Has to sit down on the park bench before she can go inside due to pain.  Severe pain.  Says the vicodin used to work well.  Pain is all the way across her low back.  Gets worse when she walks.  Gets to where right leg gets numb.  Says she's had several xrays, later had shots in back and they wanted to do surgery, and she did not want to have surgery b/c she lives alone and did not want to go to a nursing home for rehab after surgery.  Went to ortho just around the corner--[facility]--was Dr. Lajoyce Corners.  Has degenerative disc disease.  We need records.  Needing two xanax to stop her panic attacks.    Had left CTS, right ankle, right wrist.  Wants no more surgery.  Uses albuterol less than once a week.    Gets nightsweats.  Never had regular periods.  Says she only had a period a few times in her life.    Review of Systems:  Review of Systems  Constitutional: Negative for fever and chills.  HENT: Negative for congestion.   Eyes: Negative for blurred vision.  Respiratory: Negative for shortness of breath.   Cardiovascular: Negative for chest pain.  Gastrointestinal: Negative for heartburn, abdominal pain, constipation, blood in stool and melena.  Genitourinary: Negative for dysuria.       Has only had a few periods her whole life???  Does not have sex with men  Musculoskeletal: Positive for back pain and joint pain.  Skin: Negative for rash.   Neurological: Negative for dizziness.  Psychiatric/Behavioral: Positive for depression. The patient is nervous/anxious.        Panic attacks    Past Medical History  Diagnosis Date  . Chronic back pain   . Other specified diseases of blood and blood-forming organs(289.89)   . Tobacco use disorder   . Insomnia, unspecified   . Acute bronchitis   . Other malaise and fatigue   . Cough     Past Surgical History  Procedure Laterality Date  . Fracture surgery    . Tonsillectomy      Social History:   reports that she has been smoking.  She does not have any smokeless tobacco history on file. She reports that  drinks alcohol. She reports that she does not use illicit drugs.  Family History  Problem Relation Age of Onset  . Arthritis Mother   . Hypertension Mother   . Hypertension Sister   . Hypertension Brother   . Hypertension Sister   . Hypertension Sister   . Hypertension Sister     Medications: Patient's Medications  New Prescriptions   No medications on file  Previous Medications   ALBUTEROL (PROVENTIL HFA;VENTOLIN HFA) 108 (90 BASE) MCG/ACT INHALER    Inhale 2 puffs into the lungs every 6 (six) hours  as needed for wheezing. For shortness of breath   ALPRAZOLAM (XANAX) 0.25 MG TABLET    Take 1 tablet (0.25 mg total) by mouth daily as needed for anxiety.   HYDROCODONE-ACETAMINOPHEN (NORCO/VICODIN) 5-325 MG PER TABLET    1-2 tablets by mouth every 6 hours as needed for pain  Modified Medications   No medications on file  Discontinued Medications   No medications on file   Physical Exam: Filed Vitals:   11/10/12 1120  BP: 110/70  Pulse: 81  Temp: 97.9 F (36.6 C)  TempSrc: Oral  Resp: 18  Height: 5\' 6"  (1.676 m)  Weight: 220 lb 9.6 oz (100.064 kg)  SpO2: 98%  Physical Exam  Constitutional: She is oriented to person, place, and time. She appears well-developed and well-nourished.  Obese female  HENT:  Head: Normocephalic and atraumatic.  Cardiovascular:  Normal rate, regular rhythm, normal heart sounds and intact distal pulses.   Pulmonary/Chest: Effort normal and breath sounds normal. No respiratory distress.  Abdominal: Soft. Bowel sounds are normal.  Musculoskeletal: Normal range of motion. She exhibits tenderness.  Across sacroiliac joints  Neurological: She is alert and oriented to person, place, and time.  Skin: Skin is warm and dry.   Labs reviewed: Basic Metabolic Panel:  Recent Labs  16/10/96 1607 06/14/12 1741  NA 138  --   K 4.9  --   CL 99  --   CO2 26  --   GLUCOSE 91  --   BUN 10  --   CREATININE 0.53  --   CALCIUM 10.2  --   MG  --  2.1  TSH  --  2.159   Liver Function Tests:  Recent Labs  06/14/12 1607  AST 35  ALT 20  ALKPHOS 108  BILITOT 0.9  PROT 8.7*  ALBUMIN 4.2    Recent Labs  06/14/12 1607  LIPASE 23   CBC:  Recent Labs  06/14/12 1607  WBC 4.9  NEUTROABS 2.9  HGB 15.1*  HCT 42.6  MCV 102.4*  PLT 178   Lipid Panel:  Recent Labs  08/30/12 0840  HDL 92  LDLCALC 61  TRIG 67  CHOLHDL 1.8   Assessment/Plan 1. Panic attack -I increased the xanax from 0.25mg  to 0.5mg  - ALPRAZolam (XANAX) 0.5 MG tablet; Take 1 tablet (0.5 mg total) by mouth daily as needed for anxiety.  Dispense: 30 tablet; Refill: 3  2. Low back pain on right side with sciatica - oxyCODONE-acetaminophen (PERCOCET) 10-325 MG per tablet; Take 1 tablet by mouth every 6 (six) hours as needed for pain.  Dispense: 120 tablet; Refill: 0 - obtain records from Dr. Lajoyce Corners to determine causes of back pain--pt does not want surgery  Labs/tests ordered:  No additional today Next appt:  EV next visit

## 2012-11-13 ENCOUNTER — Encounter: Payer: Self-pay | Admitting: Internal Medicine

## 2012-11-29 ENCOUNTER — Ambulatory Visit (INDEPENDENT_AMBULATORY_CARE_PROVIDER_SITE_OTHER): Payer: Medicare Other | Admitting: Nurse Practitioner

## 2012-11-29 ENCOUNTER — Encounter: Payer: Self-pay | Admitting: Nurse Practitioner

## 2012-11-29 ENCOUNTER — Other Ambulatory Visit: Payer: Self-pay | Admitting: Geriatric Medicine

## 2012-11-29 VITALS — BP 138/80 | HR 88 | Temp 98.7°F | Resp 16 | Ht 66.0 in | Wt 215.8 lb

## 2012-11-29 DIAGNOSIS — B370 Candidal stomatitis: Secondary | ICD-10-CM

## 2012-11-29 MED ORDER — MAGIC MOUTHWASH W/LIDOCAINE: ORAL | Status: DC

## 2012-11-29 MED ORDER — FLUCONAZOLE 150 MG PO TABS: ORAL_TABLET | ORAL | Status: DC

## 2012-11-29 NOTE — Progress Notes (Signed)
Patient ID: Laura [name], female   DOB: Sep 26, 1960, 52 y.o.   MRN: [id number]   Allergies  Allergen Reactions  . Aspirin Hives  . Penicillins Hives    Chief Complaint  Patient presents with  . Acute Visit    used e-cig=mouth burning, lump in throat, unable to drink anything but water sips.    HPI: Patient is a 52 y.o. female seen in the office today for mouth hurting and burning and throat pain for 2-3 days. Started using e-cigs for 2 weeks and thinks this could be the cause. Stop using e-cigs but no improvement. No sores or open areas.  Can not eat or drink anything except for sips of water due to the pain.  Has gurgled salt water but did not help  No fevers or chills Review of Systems:  Review of Systems  Constitutional: Negative for fever, chills and malaise/fatigue.  HENT: Positive for sore throat.   Respiratory: Negative for cough, shortness of breath and wheezing. Stridor: see HPI.   Cardiovascular: Negative for chest pain and palpitations.  Skin: Negative.   Neurological: Negative for weakness.     Past Medical History  Diagnosis Date  . Chronic back pain   . Other specified diseases of blood and blood-forming organs(289.89)   . Tobacco use disorder   . Insomnia, unspecified   . Acute bronchitis   . Other malaise and fatigue   . Cough    Past Surgical History  Procedure Laterality Date  . Fracture surgery    . Tonsillectomy     Social History:   reports that she has been smoking.  She does not have any smokeless tobacco history on file. She reports that  drinks alcohol. She reports that she does not use illicit drugs.  Family History  Problem Relation Age of Onset  . Arthritis Mother   . Hypertension Mother   . Hypertension Sister   . Hypertension Brother   . Hypertension Sister   . Hypertension Sister   . Hypertension Sister     Medications: Patient's Medications  New Prescriptions   No medications on file  Previous Medications   ALBUTEROL  (PROVENTIL HFA;VENTOLIN HFA) 108 (90 BASE) MCG/ACT INHALER    Inhale 2 puffs into the lungs every 6 (six) hours as needed for wheezing. For shortness of breath   ALPRAZOLAM (XANAX) 0.5 MG TABLET    Take 1 tablet (0.5 mg total) by mouth daily as needed for anxiety.   HYDROCODONE-ACETAMINOPHEN (NORCO/VICODIN) 5-325 MG PER TABLET       OXYCODONE-ACETAMINOPHEN (PERCOCET) 10-325 MG PER TABLET    Take 1 tablet by mouth every 6 (six) hours as needed for pain.  Modified Medications   No medications on file  Discontinued Medications   No medications on file     Physical Exam:  Filed Vitals:   11/29/12 1023  BP: 138/80  Pulse: 88  Temp: 98.7 F (37.1 C)  TempSrc: Oral  Resp: 16  Height: 5\' 6"  (1.676 m)  Weight: 215 lb 12.8 oz (97.886 kg)  SpO2: 99%    Physical Exam  Constitutional: She is oriented to person, place, and time and well-developed, well-nourished, and in no distress. No distress.  HENT:  Head: Normocephalic and atraumatic.  Right Ear: External ear normal.  Mouth/Throat: Uvula is midline. No oropharyngeal exudate, posterior oropharyngeal edema or posterior oropharyngeal erythema.  Buccal red with pinpoint white dots on bilateral inner cheeks buccal mucous   Eyes: Conjunctivae and EOM are normal. Pupils are equal, round,  and reactive to light.  Neck: Normal range of motion. Neck supple. No thyromegaly present.  Cardiovascular: Normal rate, regular rhythm and normal heart sounds.   Pulmonary/Chest: Effort normal and breath sounds normal.  Abdominal: Soft. Bowel sounds are normal. She exhibits no distension.  Neurological: She is alert and oriented to person, place, and time.  Skin: Skin is warm and dry. She is not diaphoretic.     Labs reviewed: Basic Metabolic Panel:  Recent Labs  16/10/96 1607 06/14/12 1741  NA 138  --   K 4.9  --   CL 99  --   CO2 26  --   GLUCOSE 91  --   BUN 10  --   CREATININE 0.53  --   CALCIUM 10.2  --   MG  --  2.1  TSH  --  2.159    Liver Function Tests:  Recent Labs  06/14/12 1607  AST 35  ALT 20  ALKPHOS 108  BILITOT 0.9  PROT 8.7*  ALBUMIN 4.2    Recent Labs  06/14/12 1607  LIPASE 23   No results found for this basename: AMMONIA,  in the last 8760 hours CBC:  Recent Labs  06/14/12 1607  WBC 4.9  NEUTROABS 2.9  HGB 15.1*  HCT 42.6  MCV 102.4*  PLT 178   Lipid Panel:  Recent Labs  08/30/12 0840  HDL 92  LDLCALC 61  TRIG 67  CHOLHDL 1.8    Past Procedures:     Assessment/PLAN 1. Oral candidiasis To avoid e-cigs at this time will treat with diflucan - fluconazole (DIFLUCAN) 150 MG tablet; Take one today and repeat in 3 days if you still have symptoms  Dispense: 2 tablet; Refill: 0 - Alum & Mag Hydroxide-Simeth (MAGIC MOUTHWASH W/LIDOCAINE) SOLN; Swish and swallow 4 times daily  Dispense: 240 mL; Refill: 0 To follow up in 1 week if no better or worse

## 2012-12-09 ENCOUNTER — Other Ambulatory Visit: Payer: Self-pay | Admitting: Geriatric Medicine

## 2012-12-09 DIAGNOSIS — M5441 Lumbago with sciatica, right side: Secondary | ICD-10-CM

## 2012-12-09 MED ORDER — OXYCODONE-ACETAMINOPHEN 10-325 MG PO TABS: 1.0000 | ORAL_TABLET | Freq: Four times a day (QID) | ORAL | Status: DC | PRN

## 2013-01-09 ENCOUNTER — Other Ambulatory Visit: Payer: Self-pay | Admitting: *Deleted

## 2013-01-09 DIAGNOSIS — M5441 Lumbago with sciatica, right side: Secondary | ICD-10-CM

## 2013-01-09 MED ORDER — OXYCODONE-ACETAMINOPHEN 10-325 MG PO TABS: 1.0000 | ORAL_TABLET | Freq: Four times a day (QID) | ORAL | Status: DC | PRN

## 2013-02-08 ENCOUNTER — Other Ambulatory Visit: Payer: Self-pay | Admitting: *Deleted

## 2013-02-08 DIAGNOSIS — M5441 Lumbago with sciatica, right side: Secondary | ICD-10-CM

## 2013-02-08 MED ORDER — OXYCODONE-ACETAMINOPHEN 10-325 MG PO TABS: 1.0000 | ORAL_TABLET | Freq: Four times a day (QID) | ORAL | Status: DC | PRN

## 2013-02-22 ENCOUNTER — Encounter: Payer: Medicare Other | Admitting: Nurse Practitioner

## 2013-03-02 ENCOUNTER — Encounter: Payer: Self-pay | Admitting: Nurse Practitioner

## 2013-03-02 ENCOUNTER — Ambulatory Visit (INDEPENDENT_AMBULATORY_CARE_PROVIDER_SITE_OTHER): Payer: Medicare Other | Admitting: Nurse Practitioner

## 2013-03-02 VITALS — BP 136/88 | HR 80 | Temp 96.8°F | Resp 12 | Ht 65.08 in | Wt 215.0 lb

## 2013-03-02 DIAGNOSIS — M1710 Unilateral primary osteoarthritis, unspecified knee: Secondary | ICD-10-CM

## 2013-03-02 DIAGNOSIS — F329 Major depressive disorder, single episode, unspecified: Secondary | ICD-10-CM

## 2013-03-02 DIAGNOSIS — J309 Allergic rhinitis, unspecified: Secondary | ICD-10-CM

## 2013-03-02 DIAGNOSIS — F17200 Nicotine dependence, unspecified, uncomplicated: Secondary | ICD-10-CM

## 2013-03-02 DIAGNOSIS — F32A Depression, unspecified: Secondary | ICD-10-CM

## 2013-03-02 DIAGNOSIS — M179 Osteoarthritis of knee, unspecified: Secondary | ICD-10-CM

## 2013-03-02 DIAGNOSIS — F3289 Other specified depressive episodes: Secondary | ICD-10-CM

## 2013-03-02 DIAGNOSIS — IMO0002 Reserved for concepts with insufficient information to code with codable children: Secondary | ICD-10-CM

## 2013-03-02 DIAGNOSIS — Z23 Encounter for immunization: Secondary | ICD-10-CM

## 2013-03-02 NOTE — Patient Instructions (Addendum)
To get Claritin 10 mg (loratadine ) OTC daily  Please follow up in 6 months for EV with fasting visit     Allergic Rhinitis Allergic rhinitis is when the mucous membranes in the nose respond to allergens. Allergens are particles in the air that cause your body to have an allergic reaction. This causes you to release allergic antibodies. Through a chain of events, these eventually cause you to release histamine into the blood stream (hence the use of antihistamines). Although meant to be protective to the body, it is this release that causes your discomfort, such as frequent sneezing, congestion and an itchy runny nose.  CAUSES  The pollen allergens may come from grasses, trees, and weeds. This is seasonal allergic rhinitis, or "hay fever." Other allergens cause year-round allergic rhinitis (perennial allergic rhinitis) such as house dust mite allergen, pet dander and mold spores.  SYMPTOMS   Nasal stuffiness (congestion).  Runny, itchy nose with sneezing and tearing of the eyes.  There is often an itching of the mouth, eyes and ears. It cannot be cured, but it can be controlled with medications. DIAGNOSIS  If you are unable to determine the offending allergen, skin or blood testing may find it. TREATMENT   Avoid the allergen.  Medications and allergy shots (immunotherapy) can help.  Hay fever may often be treated with antihistamines in pill or nasal spray forms. Antihistamines block the effects of histamine. There are over-the-counter medicines that may help with nasal congestion and swelling around the eyes. Check with your caregiver before taking or giving this medicine. If the treatment above does not work, there are many new medications your caregiver can prescribe. Stronger medications may be used if initial measures are ineffective. Desensitizing injections can be used if medications and avoidance fails. Desensitization is when a patient is given ongoing shots until the body becomes  less sensitive to the allergen. Make sure you follow up with your caregiver if problems continue. SEEK MEDICAL CARE IF:   You develop fever (more than 100.5 F (38.1 C).  You develop a cough that does not stop easily (persistent).  You have shortness of breath.  You start wheezing.  Symptoms interfere with normal daily activities. Document Released: 12/30/2000 Document Revised: 06/29/2011 Document Reviewed: 07/11/2008 [facility] Patient Information 2014 [place], [place].

## 2013-03-02 NOTE — Progress Notes (Signed)
Patient ID: Laura [name], female   DOB: [birth year], 52 y.o.   MRN: [id number]   Allergies  Allergen Reactions  . Aspirin Hives  . Penicillins Hives    Chief Complaint  Patient presents with  . Annual Exam    Annual exam with fasting labs, refuse pap  . Medication Management    Patient reqeust ABX, cough and runny nose since Saturday     HPI: Patient is a 52 y.o. female seen in the office today for routine follow up; Pt reports she is not doing good, her sister died 2 weeks yesterday and this has been hard on her; otherwise feeling fine. Reports she did use alcohol to help; but reports she is able to conduct every day activity and does not feel like she is depressed just sad.  Has had cough and runny nose for 5 days; no changes, no fevers or chills, not taking any medications for this; no shortness of breath or nasal or chest congestion.  Pain has been well controlled on current medication; takes all that is prescribed to her, does not get medication from any other source, denies any constipation or other side effects from medication.  Review of Systems:  Review of Systems  Constitutional: Negative for fever, chills and malaise/fatigue.  HENT: Negative for congestion, ear pain, nosebleeds, sore throat and tinnitus.   Respiratory: Negative for cough, shortness of breath and wheezing.   Cardiovascular: Negative for chest pain and palpitations.  Gastrointestinal: Negative for heartburn, abdominal pain, diarrhea and constipation.  Genitourinary: Negative for dysuria.  Musculoskeletal: Positive for joint pain (knee pain).  Skin: Negative.   Neurological: Negative for dizziness, weakness and headaches.  Psychiatric/Behavioral: Negative for depression. The patient is not nervous/anxious and does not have insomnia.      Past Medical History  Diagnosis Date  . Chronic back pain   . Other specified diseases of blood and blood-forming organs(289.89)   . Tobacco use disorder   . Insomnia,  unspecified   . Acute bronchitis   . Other malaise and fatigue   . Cough    Past Surgical History  Procedure Laterality Date  . Fracture surgery    . Tonsillectomy     Social History:   reports that she has been smoking.  She does not have any smokeless tobacco history on file. She reports that she drinks alcohol. She reports that she does not use illicit drugs.  Family History  Problem Relation Age of Onset  . Arthritis Mother   . Hypertension Mother   . Hypertension Sister   . Hypertension Brother   . Hypertension Sister   . Hypertension Sister   . Hypertension Sister     Medications: Patient's Medications  New Prescriptions   No medications on file  Previous Medications   ALBUTEROL (PROVENTIL HFA;VENTOLIN HFA) 108 (90 BASE) MCG/ACT INHALER    Inhale 2 puffs into the lungs every 6 (six) hours as needed for wheezing. For shortness of breath   ALPRAZOLAM (XANAX) 0.5 MG TABLET    Take 1 tablet (0.5 mg total) by mouth daily as needed for anxiety.   ALUM & MAG HYDROXIDE-SIMETH (MAGIC MOUTHWASH W/LIDOCAINE) SOLN    Swish and swallow 4 times daily   OXYCODONE-ACETAMINOPHEN (PERCOCET) 10-325 MG PER TABLET    Take 1 tablet by mouth every 6 (six) hours as needed for pain.  Modified Medications   No medications on file  Discontinued Medications   FLUCONAZOLE (DIFLUCAN) 150 MG TABLET    Take one today and  repeat in 3 days if you still have symptoms   HYDROCODONE-ACETAMINOPHEN (NORCO/VICODIN) 5-325 MG PER TABLET         Physical Exam:  Filed Vitals:   03/02/13 1115  BP: 136/88  Pulse: 80  Temp: 96.8 F (36 C)  Resp: 12  Height: 5' 5.08" (1.653 m)  Weight: 215 lb (97.523 kg)  SpO2: 98%    Physical Exam  Constitutional: She is oriented to person, place, and time and well-developed, well-nourished, and in no distress. No distress.  HENT:  Head: Normocephalic and atraumatic.  Right Ear: External ear normal.  Mouth/Throat: Uvula is midline. No oropharyngeal exudate,  posterior oropharyngeal edema or posterior oropharyngeal erythema.  Buccal red with pinpoint white dots on bilateral inner cheeks buccal mucous   Eyes: Conjunctivae and EOM are normal. Pupils are equal, round, and reactive to light.  Neck: Normal range of motion. Neck supple. No thyromegaly present.  Cardiovascular: Normal rate, regular rhythm and normal heart sounds.   Pulmonary/Chest: Effort normal and breath sounds normal.  Abdominal: Soft. Bowel sounds are normal. She exhibits no distension.  Musculoskeletal: She exhibits no edema.  Neurological: She is alert and oriented to person, place, and time.  Skin: Skin is warm and dry. She is not diaphoretic.     Labs reviewed: Basic Metabolic Panel:  Recent Labs  16/10/96 1607 06/14/12 1741  NA 138  --   K 4.9  --   CL 99  --   CO2 26  --   GLUCOSE 91  --   BUN 10  --   CREATININE 0.53  --   CALCIUM 10.2  --   MG  --  2.1  TSH  --  2.159   Liver Function Tests:  Recent Labs  06/14/12 1607  AST 35  ALT 20  ALKPHOS 108  BILITOT 0.9  PROT 8.7*  ALBUMIN 4.2    Recent Labs  06/14/12 1607  LIPASE 23   No results found for this basename: AMMONIA,  in the last 8760 hours CBC:  Recent Labs  06/14/12 1607  WBC 4.9  NEUTROABS 2.9  HGB 15.1*  HCT 42.6  MCV 102.4*  PLT 178   Lipid Panel:  Recent Labs  08/30/12 0840  HDL 92  LDLCALC 61  TRIG 67  CHOLHDL 1.8   TSH:  Recent Labs  06/14/12 1741  TSH 2.159    Assessment/Plan 1.  Need for prophylactic vaccination and inoculation against influenza Flu vaccine given  2. Smoker & Need for prophylactic vaccination against Streptococcus pneumoniae (pneumococcus) - Pneumococcal polysaccharide vaccine 23-valent greater than or equal to 2yo subcutaneous/IM  4. Depression -reports this is stable; does not feel like she needs a medication, feels sad but is able to cope with this -discouraged the use of ETOH (esp with using pain medication and combination may  excerbate effect- NOT to take pain medication with alcohol)   5. OA (osteoarthritis) of knee -stable on current dose of oxycodone -urine drug scrren - Comprehensive metabolic panel  6. Allergic rhinitis - to take Claritin 10 mg daily   - CBC With differential/Platelet   Will need to follow up in 6 months for EV with fasting lipids, CMP before visit

## 2013-03-03 LAB — CBC WITH DIFFERENTIAL
Basos: 1 %
HCT: 41.6 % (ref 34.0–46.6)
Hemoglobin: 14 g/dL (ref 11.1–15.9)
Lymphocytes Absolute: 1.3 10*3/uL (ref 0.7–3.1)
Lymphs: 38 %
MCV: 118 fL — ABNORMAL HIGH (ref 79–97)
Monocytes: 10 %
Neutrophils Absolute: 1.6 10*3/uL (ref 1.4–7.0)
WBC: 3.3 10*3/uL — ABNORMAL LOW (ref 3.4–10.8)

## 2013-03-03 LAB — COMPREHENSIVE METABOLIC PANEL
Albumin/Globulin Ratio: 1.7 (ref 1.1–2.5)
Albumin: 4.5 g/dL (ref 3.5–5.5)
BUN/Creatinine Ratio: 8 — ABNORMAL LOW (ref 9–23)
BUN: 7 mg/dL (ref 6–24)
Chloride: 94 mmol/L — ABNORMAL LOW (ref 97–108)
Creatinine, Ser: 0.84 mg/dL (ref 0.57–1.00)
GFR calc Af Amer: 94 mL/min/{1.73_m2} (ref >59)
GFR calc non Af Amer: 81 mL/min/{1.73_m2} (ref >59)
Globulin, Total: 2.6 g/dL (ref 1.5–4.5)
Glucose: 80 mg/dL (ref 65–99)
Total Bilirubin: 1.3 mg/dL — ABNORMAL HIGH (ref 0.0–1.2)
Total Protein: 7.1 g/dL (ref 6.0–8.5)

## 2013-03-06 ENCOUNTER — Encounter: Payer: Self-pay | Admitting: *Deleted

## 2013-03-07 LAB — DRUG PROFILE, UR, 9 DRUGS (LABCORP)
Benzodiazepine Quant, Ur: NEGATIVE ng/mL
Cannabinoid Quant, Ur: NEGATIVE ng/mL
Cocaine (Metab.): NEGATIVE ng/mL
PCP Quant, Ur: NEGATIVE ng/mL

## 2013-03-07 LAB — OXYCODONE/OXYMORPHONE (GC/MS)
Oxycodone (GC/MS): 705 ng/mL
Oxycodone, Conf, Urine: POSITIVE — AB

## 2013-03-07 LAB — OXYCODONE/OXYMORPHONE, URINE

## 2013-03-10 ENCOUNTER — Other Ambulatory Visit: Payer: Self-pay

## 2013-03-10 DIAGNOSIS — M5441 Lumbago with sciatica, right side: Secondary | ICD-10-CM

## 2013-03-10 MED ORDER — OXYCODONE-ACETAMINOPHEN 10-325 MG PO TABS: 1.0000 | ORAL_TABLET | Freq: Four times a day (QID) | ORAL | Status: DC | PRN

## 2013-03-10 NOTE — Telephone Encounter (Signed)
Last filled 02-08-2013

## 2013-03-17 ENCOUNTER — Encounter (HOSPITAL_COMMUNITY)
Admission: EM | Disposition: A | Discharge status: Home / Self Care | Payer: Self-pay | Source: Home / Self Care | Attending: Internal Medicine

## 2013-03-17 ENCOUNTER — Inpatient Hospital Stay (HOSPITAL_COMMUNITY)
Admission: EM | Admit: 2013-03-17 | Discharge: 2013-03-20 | DRG: 369 | Disposition: A | Discharge status: Home / Self Care | Payer: Medicare Other | Attending: Internal Medicine | Admitting: Internal Medicine

## 2013-03-17 ENCOUNTER — Encounter (HOSPITAL_COMMUNITY): Payer: Self-pay | Admitting: Emergency Medicine

## 2013-03-17 DIAGNOSIS — Z880 Allergy status to penicillin: Secondary | ICD-10-CM

## 2013-03-17 DIAGNOSIS — R059 Cough, unspecified: Secondary | ICD-10-CM | POA: Diagnosis present

## 2013-03-17 DIAGNOSIS — G8929 Other chronic pain: Secondary | ICD-10-CM | POA: Diagnosis present

## 2013-03-17 DIAGNOSIS — Z886 Allergy status to analgesic agent status: Secondary | ICD-10-CM

## 2013-03-17 DIAGNOSIS — K922 Gastrointestinal hemorrhage, unspecified: Secondary | ICD-10-CM | POA: Diagnosis present

## 2013-03-17 DIAGNOSIS — D62 Acute posthemorrhagic anemia: Secondary | ICD-10-CM | POA: Diagnosis present

## 2013-03-17 DIAGNOSIS — K921 Melena: Secondary | ICD-10-CM

## 2013-03-17 DIAGNOSIS — F17200 Nicotine dependence, unspecified, uncomplicated: Secondary | ICD-10-CM | POA: Diagnosis present

## 2013-03-17 DIAGNOSIS — G4700 Insomnia, unspecified: Secondary | ICD-10-CM | POA: Diagnosis present

## 2013-03-17 DIAGNOSIS — F1010 Alcohol abuse, uncomplicated: Secondary | ICD-10-CM | POA: Diagnosis present

## 2013-03-17 DIAGNOSIS — K226 Gastro-esophageal laceration-hemorrhage syndrome: Principal | ICD-10-CM

## 2013-03-17 DIAGNOSIS — M549 Dorsalgia, unspecified: Secondary | ICD-10-CM | POA: Diagnosis present

## 2013-03-17 DIAGNOSIS — K920 Hematemesis: Secondary | ICD-10-CM

## 2013-03-17 DIAGNOSIS — R05 Cough: Secondary | ICD-10-CM

## 2013-03-17 HISTORY — DX: Adverse effect of unspecified anesthetic, initial encounter: T41.45XA

## 2013-03-17 HISTORY — PX: ESOPHAGOGASTRODUODENOSCOPY: SHX5428

## 2013-03-17 HISTORY — DX: Other complications of anesthesia, initial encounter: T88.59XA

## 2013-03-17 LAB — TSH: TSH: 1.143 u[IU]/mL (ref 0.350–4.500)

## 2013-03-17 LAB — CBC
MCH: 39.4 pg — ABNORMAL HIGH (ref 26.0–34.0)
MCHC: 34.5 g/dL (ref 30.0–36.0)
Platelets: 189 10*3/uL (ref 150–400)
RBC: 2.97 MIL/uL — ABNORMAL LOW (ref 3.87–5.11)

## 2013-03-17 LAB — TYPE AND SCREEN
ABO/RH(D): O POS
Antibody Screen: NEGATIVE

## 2013-03-17 LAB — COMPREHENSIVE METABOLIC PANEL
ALT: 10 U/L (ref 0–35)
AST: 18 U/L (ref 0–37)
Calcium: 9.1 mg/dL (ref 8.4–10.5)
Sodium: 140 mEq/L (ref 135–145)
Total Bilirubin: 0.6 mg/dL (ref 0.3–1.2)
Total Protein: 6.6 g/dL (ref 6.0–8.3)

## 2013-03-17 LAB — ABO/RH: ABO/RH(D): O POS

## 2013-03-17 LAB — GASTRIC OCCULT BLOOD (1-CARD TO LAB): Occult Blood, Gastric: POSITIVE — AB

## 2013-03-17 SURGERY — EGD (ESOPHAGOGASTRODUODENOSCOPY)
Anesthesia: Moderate Sedation

## 2013-03-17 MED ORDER — PANTOPRAZOLE SODIUM 40 MG IV SOLR
40.0000 mg | Freq: Once | INTRAVENOUS | Status: AC
  Administered 2013-03-17: 40 mg via INTRAVENOUS
  Filled 2013-03-17: qty 40

## 2013-03-17 MED ORDER — ONDANSETRON HCL 4 MG/2ML IJ SOLN
4.0000 mg | Freq: Once | INTRAMUSCULAR | Status: AC
  Administered 2013-03-17: 4 mg via INTRAVENOUS
  Filled 2013-03-17: qty 2

## 2013-03-17 MED ORDER — ALBUTEROL SULFATE HFA 108 (90 BASE) MCG/ACT IN AERS
2.0000 | INHALATION_SPRAY | Freq: Four times a day (QID) | RESPIRATORY_TRACT | Status: DC | PRN
  Filled 2013-03-17 (×2): qty 6.7

## 2013-03-17 MED ORDER — SODIUM CHLORIDE 0.9 % IV SOLN
INTRAVENOUS | Status: DC
  Administered 2013-03-17 – 2013-03-19 (×2): 1000 mL via INTRAVENOUS

## 2013-03-17 MED ORDER — SODIUM CHLORIDE 0.9 % IV SOLN: INTRAVENOUS | Status: DC

## 2013-03-17 MED ORDER — OXYCODONE HCL 5 MG PO TABS: 5.0000 mg | ORAL_TABLET | Freq: Four times a day (QID) | ORAL | Status: DC | PRN

## 2013-03-17 MED ORDER — MIDAZOLAM HCL 10 MG/2ML IJ SOLN
INTRAMUSCULAR | Status: DC | PRN
  Administered 2013-03-17 (×3): 2.5 mg via INTRAVENOUS

## 2013-03-17 MED ORDER — BUTAMBEN-TETRACAINE-BENZOCAINE 2-2-14 % EX AERO
INHALATION_SPRAY | CUTANEOUS | Status: DC | PRN
  Administered 2013-03-17: 2 via TOPICAL

## 2013-03-17 MED ORDER — SODIUM CHLORIDE 0.9 % IV SOLN
8.0000 mg/h | INTRAVENOUS | Status: DC
  Administered 2013-03-17 – 2013-03-19 (×4): 8 mg/h via INTRAVENOUS
  Filled 2013-03-17 (×11): qty 80

## 2013-03-17 MED ORDER — FENTANYL CITRATE 0.05 MG/ML IJ SOLN
INTRAMUSCULAR | Status: DC | PRN
  Administered 2013-03-17 (×3): 25 ug via INTRAVENOUS

## 2013-03-17 MED ORDER — DIPHENHYDRAMINE HCL 50 MG/ML IJ SOLN
INTRAMUSCULAR | Status: AC
  Filled 2013-03-17: qty 1

## 2013-03-17 MED ORDER — ALPRAZOLAM 0.5 MG PO TABS: 0.5000 mg | ORAL_TABLET | Freq: Every day | ORAL | Status: DC | PRN

## 2013-03-17 MED ORDER — OXYCODONE-ACETAMINOPHEN 10-325 MG PO TABS: 1.0000 | ORAL_TABLET | Freq: Four times a day (QID) | ORAL | Status: DC | PRN

## 2013-03-17 MED ORDER — OXYCODONE-ACETAMINOPHEN 5-325 MG PO TABS: 1.0000 | ORAL_TABLET | Freq: Four times a day (QID) | ORAL | Status: DC | PRN

## 2013-03-17 MED ORDER — SODIUM CHLORIDE 0.9 % IJ SOLN
3.0000 mL | Freq: Two times a day (BID) | INTRAMUSCULAR | Status: DC
  Administered 2013-03-19 – 2013-03-20 (×3): 3 mL via INTRAVENOUS

## 2013-03-17 MED ORDER — SODIUM CHLORIDE 0.9 % IV BOLUS (SEPSIS)
1000.0000 mL | Freq: Once | INTRAVENOUS | Status: AC
  Administered 2013-03-17: 1000 mL via INTRAVENOUS

## 2013-03-17 MED ORDER — FENTANYL CITRATE 0.05 MG/ML IJ SOLN
INTRAMUSCULAR | Status: AC
  Filled 2013-03-17: qty 2

## 2013-03-17 MED ORDER — MIDAZOLAM HCL 5 MG/ML IJ SOLN
INTRAMUSCULAR | Status: AC
  Filled 2013-03-17: qty 2

## 2013-03-17 NOTE — ED Notes (Signed)
Had physical last week and told her liver number were elevated.  Pts. Sclera is yellowish.

## 2013-03-17 NOTE — ED Notes (Signed)
Mostly black tarry stools after having a bm, followed by n/v - bloody emesis per ems assess.

## 2013-03-17 NOTE — Interval H&P Note (Signed)
History and Physical Interval Note:  03/17/2013 10:38 AM  Laura [name]  has presented today for surgery, with the diagnosis of UGIB  The various methods of treatment have been discussed with the patient and family. After consideration of risks, benefits and other options for treatment, the patient has consented to  Procedure(s): ESOPHAGOGASTRODUODENOSCOPY (EGD) (N/A) as a surgical intervention .  The patient's history has been reviewed, patient examined, no change in status, stable for surgery.  I have reviewed the patient's chart and labs.  Questions were answered to the patient's satisfaction.     Venita Lick. Russella Dar MD Clementeen Graham

## 2013-03-17 NOTE — H&P (Addendum)
[facility] History and Physical  Laura [name] AVW:[id number] DOB: 1960-12-22 DOA: 03/17/2013  Referring physician: EDP PCP: Lenoard Aden, NP  Specialists: none  Chief Complaint: black stools and hematemesis from 2 am today  HPI: Laura [name] is a 52 y.o. female with h/o back pain came in to ED today with reports of black stools and hematemesis this am. She denies use of NSAIDS or steroids. She reports she had a cold since 4 days and was using alka seltzer and this morning at 2 am woke up to use the bathroom, noticed black stools and one episode of hematemesis. She had another episode in ED. Her labs revealed anemia, and elevated BUN. Gi consulted by ED and she was referred to medical service for admission.   Review of Systems: The patient denies anorexia, fever, weight loss,, vision loss, decreased hearing, hoarseness, chest pain, syncope, dyspnea on exertion, peripheral edema, balance deficits, abdominal pain, melena,  severe  hematuria, incontinence, genital sores, muscle weakness, suspicious skin lesions, transient blindness, difficulty walking, depression, unusual weight change, abnormal bleeding, enlarged lymph nodes, angioedema, and breast masses.    Past Medical History  Diagnosis Date  . Chronic back pain   . Other specified diseases of blood and blood-forming organs(289.89)   . Tobacco use disorder   . Insomnia, unspecified   . Acute bronchitis   . Other malaise and fatigue   . Cough   . Complication of anesthesia     "didn't wake up"   Past Surgical History  Procedure Laterality Date  . Fracture surgery    . Tonsillectomy     Social History:  reports that she has been smoking.  She does not have any smokeless tobacco history on file. She reports that she drinks about 2.4 ounces of alcohol per week. She reports that she does not use illicit drugs.  where does patient live--home,  Can patient participate in ADLs?  Allergies  Allergen Reactions  . Aspirin Hives   . Penicillins Hives    Family History  Problem Relation Age of Onset  . Arthritis Mother   . Hypertension Mother   . Hypertension Sister   . Hypertension Brother   . Hypertension Sister   . Hypertension Sister   . Hypertension Sister    *  Prior to Admission medications   Medication Sig Start Date End Date Taking? Authorizing Provider  albuterol (PROVENTIL HFA;VENTOLIN HFA) 108 (90 BASE) MCG/ACT inhaler Inhale 2 puffs into the lungs every 6 (six) hours as needed for wheezing. For shortness of breath   Yes Historical Provider, MD  ALPRAZolam (XANAX) 0.5 MG tablet Take 1 tablet (0.5 mg total) by mouth daily as needed for anxiety. 11/10/12  Yes Tiffany L Reed, DO  DM-Doxylamine-Acetaminophen (NYQUIL COLD & FLU PO) Take 2 capsules by mouth at bedtime as needed ("cold").   Yes Historical Provider, MD  oxyCODONE-acetaminophen (PERCOCET) 10-325 MG per tablet Take 1 tablet by mouth every 6 (six) hours as needed for pain. 03/10/13  Yes Claudie Revering, NP  sodium-potassium bicarbonate (ALKA-SELTZER GOLD) TBEF dissolvable tablet Take 2 tablets by mouth daily as needed ("for cough").   Yes Historical Provider, MD   Physical Exam: Filed Vitals:   03/17/13 1159  BP: 84/40  Pulse: 84  Temp: 97.5 F (36.4 C)  Resp: 16    Constitutional: Vital signs reviewed.  Patient is a well-developed and well-nourished  in no acute distress and cooperative with exam. Alert and oriented x3.  Head: Normocephalic and atraumatic Mouth: no  erythema or exudates, MMM Eyes: PERRL, EOMI, conjunctivae normal, No scleral icterus.  Neck: Supple, Trachea midline normal ROM, No JVD, mass, thyromegaly, or carotid bruit present.  Cardiovascular: RRR, S1 normal, S2 normal, no MRG, pulses symmetric and intact bilaterally Pulmonary/Chest: normal respiratory effort, CTAB, no wheezes, rales, or rhonchi Abdominal: Soft. Non-tender, non-distended, bowel sounds are normal, no masses, organomegaly, or guarding present.   Musculoskeletal: No joint deformities, erythema, or stiffness, ROM full and no nontender  Neurological: A&O x3, Strength is normal and symmetric bilaterally,no focal motor deficit, sensory intact to light touch bilaterally.  Skin: Warm, dry and intact. No rash, cyanosis, or clubbing.  Psychiatric: Normal mood and affect. speech and behavior is normal.    Labs on Admission:  Basic Metabolic Panel:  Recent Labs Lab 03/17/13 0531  NA 140  K 4.1  CL 103  CO2 24  GLUCOSE 107*  BUN 30*  CREATININE 0.55  CALCIUM 9.1   Liver Function Tests:  Recent Labs Lab 03/17/13 0531  AST 18  ALT 10  ALKPHOS 72  BILITOT 0.6  PROT 6.6  ALBUMIN 3.2*   No results found for this basename: LIPASE, AMYLASE,  in the last 168 hours No results found for this basename: AMMONIA,  in the last 168 hours CBC:  Recent Labs Lab 03/17/13 0531  WBC 8.4  HGB 11.7*  HCT 33.9*  MCV 114.1*  PLT 189   Cardiac Enzymes: No results found for this basename: CKTOTAL, CKMB, CKMBINDEX, TROPONINI,  in the last 168 hours  BNP (last 3 results) No results found for this basename: PROBNP,  in the last 8760 hours CBG: No results found for this basename: GLUCAP,  in the last 168 hours  Radiological Exams on Admission: No results found.  EKG: sinus tachy  Assessment/Plan Active Problems:   GI bleeding   GI bleed   Hematemesis   Melena   Mallory-Weiss tear   1. GI bleed: probably upper GI bleed ,  - admit to step down  - GI consulted.  - NPO, plan for EGD today.  - IV protonix. Anti emetics, IV fluids.  - q8hr H&H - SCD'S  2. Back pain:  - stable.   3. Anemia: from GI bleed.  - continue to monitor H&H  4. DVT prophylaxis.   Code Status: full code Family Communication: none atbedside, discussed the plan of care with patient Disposition Plan: admit to step down  Time spent: 67  Laura [name] [facility] Pager [phone]  If 7PM-7AM, please contact  night-coverage [email or web address] Password [email or web address] 03/17/2013, 12:10 PM

## 2013-03-17 NOTE — ED Notes (Signed)
Patient transported to GI procedure.

## 2013-03-17 NOTE — ED Notes (Signed)
Report called to Heather, RN.

## 2013-03-17 NOTE — Consult Note (Signed)
Referring Provider: No ref. provider found Primary Care Physician:  Lenoard Aden, NP Primary Gastroenterologist:  None, unassigned  Reason for Consultation:  GIB  HPI: Laura [name] is a 52 y.o. female with limited PMH presents to [facility] today with complaints of black stools and vomiting blood.  Says that yesterday she was feeling fine, but this morning she woke up and had a black BM (followed by a few more of the same).  Then also vomited blood.  Said that it was dark red in color.  Occurred twice at home and once in the ED.  Was heme positive.  Never had anything similar to this in the past.  Had a normal BM yesterday.  No abdominal pain.  York Spaniel that she had an EGD several years ago in [place], but nothing was found at that time.  Denies NSAID use, including BC's and Goodie powders, but used a box of Alka-Seltzer cold medicine over the past 4 days or so.  No problems with heartburn or reflux.  Does drink ETOH but no history of liver disease documented.   Past Medical History  Diagnosis Date  . Chronic back pain   . Other specified diseases of blood and blood-forming organs(289.89)   . Tobacco use disorder   . Insomnia, unspecified   . Acute bronchitis   . Other malaise and fatigue   . Cough     Past Surgical History  Procedure Laterality Date  . Fracture surgery    . Tonsillectomy      Prior to Admission medications   Medication Sig Start Date End Date Taking? Authorizing Provider  albuterol (PROVENTIL HFA;VENTOLIN HFA) 108 (90 BASE) MCG/ACT inhaler Inhale 2 puffs into the lungs every 6 (six) hours as needed for wheezing. For shortness of breath   Yes Historical Provider, MD  ALPRAZolam (XANAX) 0.5 MG tablet Take 1 tablet (0.5 mg total) by mouth daily as needed for anxiety. 11/10/12  Yes Tiffany L Reed, DO  DM-Doxylamine-Acetaminophen (NYQUIL COLD & FLU PO) Take 2 capsules by mouth at bedtime as needed ("cold").   Yes Historical Provider, MD  oxyCODONE-acetaminophen (PERCOCET) 10-325  MG per tablet Take 1 tablet by mouth every 6 (six) hours as needed for pain. 03/10/13  Yes Claudie Revering, NP  sodium-potassium bicarbonate (ALKA-SELTZER GOLD) TBEF dissolvable tablet Take 2 tablets by mouth daily as needed ("for cough").   Yes Historical Provider, MD    No current facility-administered medications for this encounter.   Current Outpatient Prescriptions  Medication Sig Dispense Refill  . albuterol (PROVENTIL HFA;VENTOLIN HFA) 108 (90 BASE) MCG/ACT inhaler Inhale 2 puffs into the lungs every 6 (six) hours as needed for wheezing. For shortness of breath      . ALPRAZolam (XANAX) 0.5 MG tablet Take 1 tablet (0.5 mg total) by mouth daily as needed for anxiety.  30 tablet  3  . DM-Doxylamine-Acetaminophen (NYQUIL COLD & FLU PO) Take 2 capsules by mouth at bedtime as needed ("cold").      . oxyCODONE-acetaminophen (PERCOCET) 10-325 MG per tablet Take 1 tablet by mouth every 6 (six) hours as needed for pain.  120 tablet  0  . sodium-potassium bicarbonate (ALKA-SELTZER GOLD) TBEF dissolvable tablet Take 2 tablets by mouth daily as needed ("for cough").        Allergies as of 03/17/2013 - Review Complete 03/17/2013  Allergen Reaction Noted  . Aspirin Hives 05/11/2011  . Penicillins Hives 05/11/2011    Family History  Problem Relation Age of Onset  . Arthritis Mother   .  Hypertension Mother   . Hypertension Sister   . Hypertension Brother   . Hypertension Sister   . Hypertension Sister   . Hypertension Sister     History   Social History  . Marital Status: Single    Spouse Name: N/A    Number of Children: N/A  . Years of Education: N/A   Occupational History  . Not on file.   Social History Main Topics  . Smoking status: Current Every Day Smoker  . Smokeless tobacco: Not on file  . Alcohol Use: 2.4 oz/week    2 Cans of beer, 2 Shots of liquor per week     Comment: ocassional  . Drug Use: No  . Sexual Activity: Not on file   Other Topics Concern  . Not on  file   Social History Narrative  . No narrative on file    Review of Systems: Ten point ROS is O/W negative except as mentioned in HPI.  Physical Exam: Vital signs in last 24 hours: Temp:  [98.6 F (37 C)] 98.6 F (37 C) (11/28 0454) Pulse Rate:  [73-110] 73 (11/28 0615) Resp:  [11-20] 11 (11/28 0545) BP: (102-124)/(62-92) 102/62 mmHg (11/28 0615) SpO2:  [96 %-100 %] 98 % (11/28 0615)   General:   Alert, Well-developed, well-nourished, pleasant and cooperative in NAD Head:  Normocephalic and atraumatic. Eyes:  Sclera clear, no icterus.  Conjunctiva pink. Ears:  Normal auditory acuity. Mouth:  No deformity or lesions. Lungs:  Clear throughout to auscultation.  No wheezes, crackles, or rhonchi.  Heart:  Regular rate and rhythm; no murmurs, clicks, rubs,  or gallops. Abdomen:  Soft, non-distended.  BS present.  Non-tender.   Rectal:  Deferred.  Heme positive in ED.  Msk:  Symmetrical without gross deformities. Pulses:  Normal pulses noted. Extremities:  Without clubbing or edema. Neurologic:  Alert and  oriented x4;  grossly normal neurologically. Skin:  Intact without significant lesions or rashes. Psych:  Alert and cooperative. Normal mood and affect.  Lab Results:  Recent Labs  03/17/13 0531  WBC 8.4  HGB 11.7*  HCT 33.9*  PLT 189   BMET  Recent Labs  03/17/13 0531  NA 140  K 4.1  CL 103  CO2 24  GLUCOSE 107*  BUN 30*  CREATININE 0.55  CALCIUM 9.1   LFT  Recent Labs  03/17/13 0531  PROT 6.6  ALBUMIN 3.2*  AST 18  ALT 10  ALKPHOS 72  BILITOT 0.6   IMPRESSION:  -UGIB with hematemesis and black stools:  Most suspicious for ulcer disease.  No known history of cirrhosis or liver disease, but does drink ETOH.  Drop from baseline Hgb and bump in BUN. -Recent Alka-Seltzer use, which contains ASA.  PLAN: -EGD today. -PPI gtt for now. -Monitor Hgb and transfuse if needed.   ZEHR, JESSICA D.  03/17/2013, 8:49 AM Pager number [phone]      Attending physician's note   I have taken a history, examined the patient and reviewed the chart. I agree with the Advanced Practitioner's note, impression and recommendations. Acute UGI bleed in setting of ASA and etoh usage. IV PPI. Monitor Hb. DC ASA/NSAIDs. EGD today.  Meryl Dare, MD [facility] Graham

## 2013-03-17 NOTE — ED Provider Notes (Signed)
CSN: [id number]     Arrival date & time 03/17/13  0445 History   First MD Initiated Contact with Patient 03/17/13 0455     Chief Complaint  Patient presents with  . Hematemesis  . Rectal Bleeding   (Consider location/radiation/quality/duration/timing/severity/associated sxs/prior Treatment) Patient is a 52 y.o. female presenting with vomiting.  Emesis Severity:  Severe Duration: A few hours. Timing:  Constant Emesis appearance: Dark blood. Progression:  Unchanged Chronicity:  New Context comment:  Patient has been having URI symptoms and subsequently has taken a box of Alka-Seltzer over the past week Relieved by:  Nothing Worsened by:  Nothing tried Associated symptoms: cough   Associated symptoms: no abdominal pain and no diarrhea   Associated symptoms comment:  Black stool x1   Past Medical History  Diagnosis Date  . Chronic back pain   . Other specified diseases of blood and blood-forming organs(289.89)   . Tobacco use disorder   . Insomnia, unspecified   . Acute bronchitis   . Other malaise and fatigue   . Cough    Past Surgical History  Procedure Laterality Date  . Fracture surgery    . Tonsillectomy     Family History  Problem Relation Age of Onset  . Arthritis Mother   . Hypertension Mother   . Hypertension Sister   . Hypertension Brother   . Hypertension Sister   . Hypertension Sister   . Hypertension Sister    History  Substance Use Topics  . Smoking status: Current Every Day Smoker  . Smokeless tobacco: Not on file  . Alcohol Use: 2.4 oz/week    2 Cans of beer, 2 Shots of liquor per week     Comment: ocassional   OB History   Grav Para Term Preterm Abortions TAB SAB Ect Mult Living                 Review of Systems  Constitutional: Negative for fever.  HENT: Negative for congestion.   Respiratory: Negative for cough and shortness of breath.   Cardiovascular: Negative for chest pain.  Gastrointestinal: Positive for vomiting and  hematochezia. Negative for nausea, abdominal pain and diarrhea.  All other systems reviewed and are negative.    Allergies  Aspirin and Penicillins  Home Medications   Current Outpatient Rx  Name  Route  Sig  Dispense  Refill  . albuterol (PROVENTIL HFA;VENTOLIN HFA) 108 (90 BASE) MCG/ACT inhaler   Inhalation   Inhale 2 puffs into the lungs every 6 (six) hours as needed for wheezing. For shortness of breath         . ALPRAZolam (XANAX) 0.5 MG tablet   Oral   Take 1 tablet (0.5 mg total) by mouth daily as needed for anxiety.   30 tablet   3   . DM-Doxylamine-Acetaminophen (NYQUIL COLD & FLU PO)   Oral   Take 2 capsules by mouth at bedtime as needed ("cold").         . oxyCODONE-acetaminophen (PERCOCET) 10-325 MG per tablet   Oral   Take 1 tablet by mouth every 6 (six) hours as needed for pain.   120 tablet   0   . sodium-potassium bicarbonate (ALKA-SELTZER GOLD) TBEF dissolvable tablet   Oral   Take 2 tablets by mouth daily as needed ("for cough").          BP 124/71  Pulse 107  Temp(Src) 98.6 F (37 C) (Oral)  Resp 18  SpO2 100% Physical Exam  Nursing note and  vitals reviewed. Constitutional: She is oriented to person, place, and time. She appears well-developed and well-nourished. No distress.  HENT:  Head: Normocephalic and atraumatic.  Mouth/Throat: Oropharynx is clear and moist.  Eyes: Conjunctivae are normal. Pupils are equal, round, and reactive to light. No scleral icterus.  Neck: Neck supple.  Cardiovascular: Normal rate, regular rhythm, normal heart sounds and intact distal pulses.   No murmur heard. Pulmonary/Chest: Effort normal and breath sounds normal. No stridor. No respiratory distress. She has no rales.  Abdominal: Soft. Bowel sounds are normal. She exhibits no distension. There is no tenderness. There is no rebound and no guarding.  Genitourinary: Rectal exam shows no fissure. Guaiac positive stool (dark).  Musculoskeletal: Normal range  of motion.  Neurological: She is alert and oriented to person, place, and time.  Skin: Skin is warm and dry. No rash noted.  Psychiatric: She has a normal mood and affect. Her behavior is normal.    ED Course  Procedures (including critical care time) Labs Review Labs Reviewed  CBC - Abnormal; Notable for the following:    RBC 2.97 (*)    Hemoglobin 11.7 (*)    HCT 33.9 (*)    MCV 114.1 (*)    MCH 39.4 (*)    All other components within normal limits  COMPREHENSIVE METABOLIC PANEL - Abnormal; Notable for the following:    Glucose, Bld 107 (*)    BUN 30 (*)    Albumin 3.2 (*)    All other components within normal limits  OCCULT BLOOD, POC DEVICE - Abnormal; Notable for the following:    Fecal Occult Bld POSITIVE (*)    All other components within normal limits  POCT GASTRIC OCCULT BLOOD (1-CARD TO LAB) - Abnormal; Notable for the following:    Occult Blood, Gastric POSITIVE (*)    All other components within normal limits  URINALYSIS, ROUTINE W REFLEX MICROSCOPIC  POCT GASTRIC OCCULT BLOOD (1-CARD TO LAB)   Imaging Review No results found.  EKG Interpretation    Date/Time:  Friday March 17 2013 04:54:58 EST Ventricular Rate:  109 PR Interval:  129 QRS Duration: 76 QT Interval:  353 QTC Calculation: 475 R Axis:   67 Text Interpretation:  Sinus tachycardia Probable left atrial enlargement Probable anteroseptal infarct, old No old tracing to compare Confirmed by [facility]  MD, TREY ([id number]) on 03/17/2013 7:46:08 AM            MDM   1. Acute upper GI bleed    52 yo female with hx of alcohol abuse presenting with hematemesis. Also had 1 dark bowel movement.   No hx of cirrhosis.  No abdominal pain, HDS.  Hemoccult and gastroccult positive.  Hg dropped from 14 to 11.7 over 2 week period.  No further vomiting after IV zofran.  IV protonix also given.  Discussed case with Dr. Russella Dar (GI) who will evaluate pt in the hospital.  Dr. Blake Divine (Internal Medicine) will admit to  stepdown unit.    Candyce Churn, MD 03/17/13 [phone]

## 2013-03-17 NOTE — ED Notes (Signed)
Pt. Having chronic cough with phlegm, but no productive cough when vomiting blood earl this morning.

## 2013-03-17 NOTE — Progress Notes (Signed)
Utilization review completed.  

## 2013-03-17 NOTE — ED Notes (Signed)
Attempted report.  Awaiting return phone call.

## 2013-03-17 NOTE — H&P (View-Only) (Signed)
Referring Provider: No ref. provider found Primary Care Physician:  Karam, Jessica Marie, NP Primary Gastroenterologist:  None, unassigned  Reason for Consultation:  GIB  HPI: Laura [name] is a 52 y.o. female with limited PMH presents to [facility] today with complaints of black stools and vomiting blood.  Says that yesterday she was feeling fine, but this morning she woke up and had a black BM (followed by a few more of the same).  Then also vomited blood.  Said that it was dark red in color.  Occurred twice at home and once in the ED.  Was heme positive.  Never had anything similar to this in the past.  Had a normal BM yesterday.  No abdominal pain.  Said that she had an EGD several years ago in , but nothing was found at that time.  Denies NSAID use, including BC's and Goodie powders, but used a box of Alka-Seltzer cold medicine over the past 4 days or so.  No problems with heartburn or reflux.  Does drink ETOH but no history of liver disease documented.   Past Medical History  Diagnosis Date  . Chronic back pain   . Other specified diseases of blood and blood-forming organs(289.89)   . Tobacco use disorder   . Insomnia, unspecified   . Acute bronchitis   . Other malaise and fatigue   . Cough     Past Surgical History  Procedure Laterality Date  . Fracture surgery    . Tonsillectomy      Prior to Admission medications   Medication Sig Start Date End Date Taking? Authorizing Provider  albuterol (PROVENTIL HFA;VENTOLIN HFA) 108 (90 BASE) MCG/ACT inhaler Inhale 2 puffs into the lungs every 6 (six) hours as needed for wheezing. For shortness of breath   Yes Historical Provider, MD  ALPRAZolam (XANAX) 0.5 MG tablet Take 1 tablet (0.5 mg total) by mouth daily as needed for anxiety. 11/10/12  Yes Tiffany L Reed, DO  DM-Doxylamine-Acetaminophen (NYQUIL COLD & FLU PO) Take 2 capsules by mouth at bedtime as needed ("cold").   Yes Historical Provider, MD  oxyCODONE-acetaminophen (PERCOCET) 10-325  MG per tablet Take 1 tablet by mouth every 6 (six) hours as needed for pain. 03/10/13  Yes Jessica M Karam, NP  sodium-potassium bicarbonate (ALKA-SELTZER GOLD) TBEF dissolvable tablet Take 2 tablets by mouth daily as needed ("for cough").   Yes Historical Provider, MD    No current facility-administered medications for this encounter.   Current Outpatient Prescriptions  Medication Sig Dispense Refill  . albuterol (PROVENTIL HFA;VENTOLIN HFA) 108 (90 BASE) MCG/ACT inhaler Inhale 2 puffs into the lungs every 6 (six) hours as needed for wheezing. For shortness of breath      . ALPRAZolam (XANAX) 0.5 MG tablet Take 1 tablet (0.5 mg total) by mouth daily as needed for anxiety.  30 tablet  3  . DM-Doxylamine-Acetaminophen (NYQUIL COLD & FLU PO) Take 2 capsules by mouth at bedtime as needed ("cold").      . oxyCODONE-acetaminophen (PERCOCET) 10-325 MG per tablet Take 1 tablet by mouth every 6 (six) hours as needed for pain.  120 tablet  0  . sodium-potassium bicarbonate (ALKA-SELTZER GOLD) TBEF dissolvable tablet Take 2 tablets by mouth daily as needed ("for cough").        Allergies as of 03/17/2013 - Review Complete 03/17/2013  Allergen Reaction Noted  . Aspirin Hives 05/11/2011  . Penicillins Hives 05/11/2011    Family History  Problem Relation Age of Onset  . Arthritis Mother   .   Hypertension Mother   . Hypertension Sister   . Hypertension Brother   . Hypertension Sister   . Hypertension Sister   . Hypertension Sister     History   Social History  . Marital Status: Single    Spouse Name: N/A    Number of Children: N/A  . Years of Education: N/A   Occupational History  . Not on file.   Social History Main Topics  . Smoking status: Current Every Day Smoker  . Smokeless tobacco: Not on file  . Alcohol Use: 2.4 oz/week    2 Cans of beer, 2 Shots of liquor per week     Comment: ocassional  . Drug Use: No  . Sexual Activity: Not on file   Other Topics Concern  . Not on  file   Social History Narrative  . No narrative on file    Review of Systems: Ten point ROS is O/W negative except as mentioned in HPI.  Physical Exam: Vital signs in last 24 hours: Temp:  [98.6 F (37 C)] 98.6 F (37 C) (11/28 0454) Pulse Rate:  [73-110] 73 (11/28 0615) Resp:  [11-20] 11 (11/28 0545) BP: (102-124)/(62-92) 102/62 mmHg (11/28 0615) SpO2:  [96 %-100 %] 98 % (11/28 0615)   General:   Alert, Well-developed, well-nourished, pleasant and cooperative in NAD Head:  Normocephalic and atraumatic. Eyes:  Sclera clear, no icterus.  Conjunctiva pink. Ears:  Normal auditory acuity. Mouth:  No deformity or lesions. Lungs:  Clear throughout to auscultation.  No wheezes, crackles, or rhonchi.  Heart:  Regular rate and rhythm; no murmurs, clicks, rubs,  or gallops. Abdomen:  Soft, non-distended.  BS present.  Non-tender.   Rectal:  Deferred.  Heme positive in ED.  Msk:  Symmetrical without gross deformities. Pulses:  Normal pulses noted. Extremities:  Without clubbing or edema. Neurologic:  Alert and  oriented x4;  grossly normal neurologically. Skin:  Intact without significant lesions or rashes. Psych:  Alert and cooperative. Normal mood and affect.  Lab Results:  Recent Labs  03/17/13 0531  WBC 8.4  HGB 11.7*  HCT 33.9*  PLT 189   BMET  Recent Labs  03/17/13 0531  NA 140  K 4.1  CL 103  CO2 24  GLUCOSE 107*  BUN 30*  CREATININE 0.55  CALCIUM 9.1   LFT  Recent Labs  03/17/13 0531  PROT 6.6  ALBUMIN 3.2*  AST 18  ALT 10  ALKPHOS 72  BILITOT 0.6   IMPRESSION:  -UGIB with hematemesis and black stools:  Most suspicious for ulcer disease.  No known history of cirrhosis or liver disease, but does drink ETOH.  Drop from baseline Hgb and bump in BUN. -Recent Alka-Seltzer use, which contains ASA.  PLAN: -EGD today. -PPI gtt for now. -Monitor Hgb and transfuse if needed.   ZEHR, JESSICA D.  03/17/2013, 8:49 AM Pager number [phone]      Attending physician's note   I have taken a history, examined the patient and reviewed the chart. I agree with the Advanced Practitioner's note, impression and recommendations. Acute UGI bleed in setting of ASA and etoh usage. IV PPI. Monitor Hb. DC ASA/NSAIDs. EGD today.  Ziare Cryder T Craigory Toste, MD FACG    

## 2013-03-17 NOTE — Op Note (Signed)
[facility] [address] [place], [address]   ENDOSCOPY PROCEDURE REPORT  PATIENT: Laura [name], Laura [name]  MR#: [id number] BIRTHDATE: 05-08-1960 , 52  yrs. old GENDER: Female ENDOSCOPIST: Meryl Dare, MD, [facility] REFERRED BY:  [facility] PROCEDURE DATE:  03/17/2013 PROCEDURE:  EGD, diagnostic ASA CLASS:     Class III INDICATIONS:  Hematemesis.   Melena. MEDICATIONS: medications were titrated to patient response per physician's verbal order, Fentanyl 75 mcg IV, and Versed 7.5 mg IV  TOPICAL ANESTHETIC: Cetacaine Spray DESCRIPTION OF PROCEDURE: After the risks benefits and alternatives of the procedure were thoroughly explained, informed consent was obtained.  The PENTAX GASTOROSCOPE [id number] endoscope was introduced through the mouth and advanced to the second portion of the duodenum. Without limitations.  The instrument was slowly withdrawn as the mucosa was fully examined.  ESOPHAGUS: The mucosa of the esophagus appeared normal. STOMACH: Mallory-Weiss tear in gastric cardia with a small adherent clot. No active bleeding. The stomach otherwise appeared normal. DUODENUM: The duodenal mucosa showed no abnormalities in the bulb and second portion of the duodenum.  Retroflexed views revealed a Mallory-Weiss tear.  The scope was then withdrawn from the patient and the procedure completed.  COMPLICATIONS: There were no complications.  ENDOSCOPIC IMPRESSION: 1.   Mallory-Weiss tear in gastric cardia with a small adherent clot.  No active bleeding. 2.   The EGD otherwise appeared normal  RECOMMENDATIONS: 1.  Continue PPI BID for 1 week then daily for 3 more weeks 2.  Control of N/V   eSigned:  Meryl Dare, MD, [facility] 03/17/2013 12:01 PM

## 2013-03-18 ENCOUNTER — Inpatient Hospital Stay (HOSPITAL_COMMUNITY): Payer: Medicare Other

## 2013-03-18 DIAGNOSIS — R059 Cough, unspecified: Secondary | ICD-10-CM | POA: Diagnosis present

## 2013-03-18 DIAGNOSIS — R05 Cough: Secondary | ICD-10-CM

## 2013-03-18 DIAGNOSIS — D62 Acute posthemorrhagic anemia: Secondary | ICD-10-CM

## 2013-03-18 LAB — HEMOGLOBIN AND HEMATOCRIT, BLOOD
HCT: 25.9 % — ABNORMAL LOW (ref 36.0–46.0)
HCT: 24 % — ABNORMAL LOW (ref 36.0–46.0)
Hemoglobin: 9.1 g/dL — ABNORMAL LOW (ref 12.0–15.0)
Hemoglobin: 8.4 g/dL — ABNORMAL LOW (ref 12.0–15.0)

## 2013-03-18 IMAGING — CR DG CHEST 1V PORT
1 series · 1 of 1 positions shown · non-contrast
Comparison: None.

CLINICAL DATA: Cough and congestion for 1 week

EXAM:
PORTABLE CHEST - 1 VIEW

[AP]
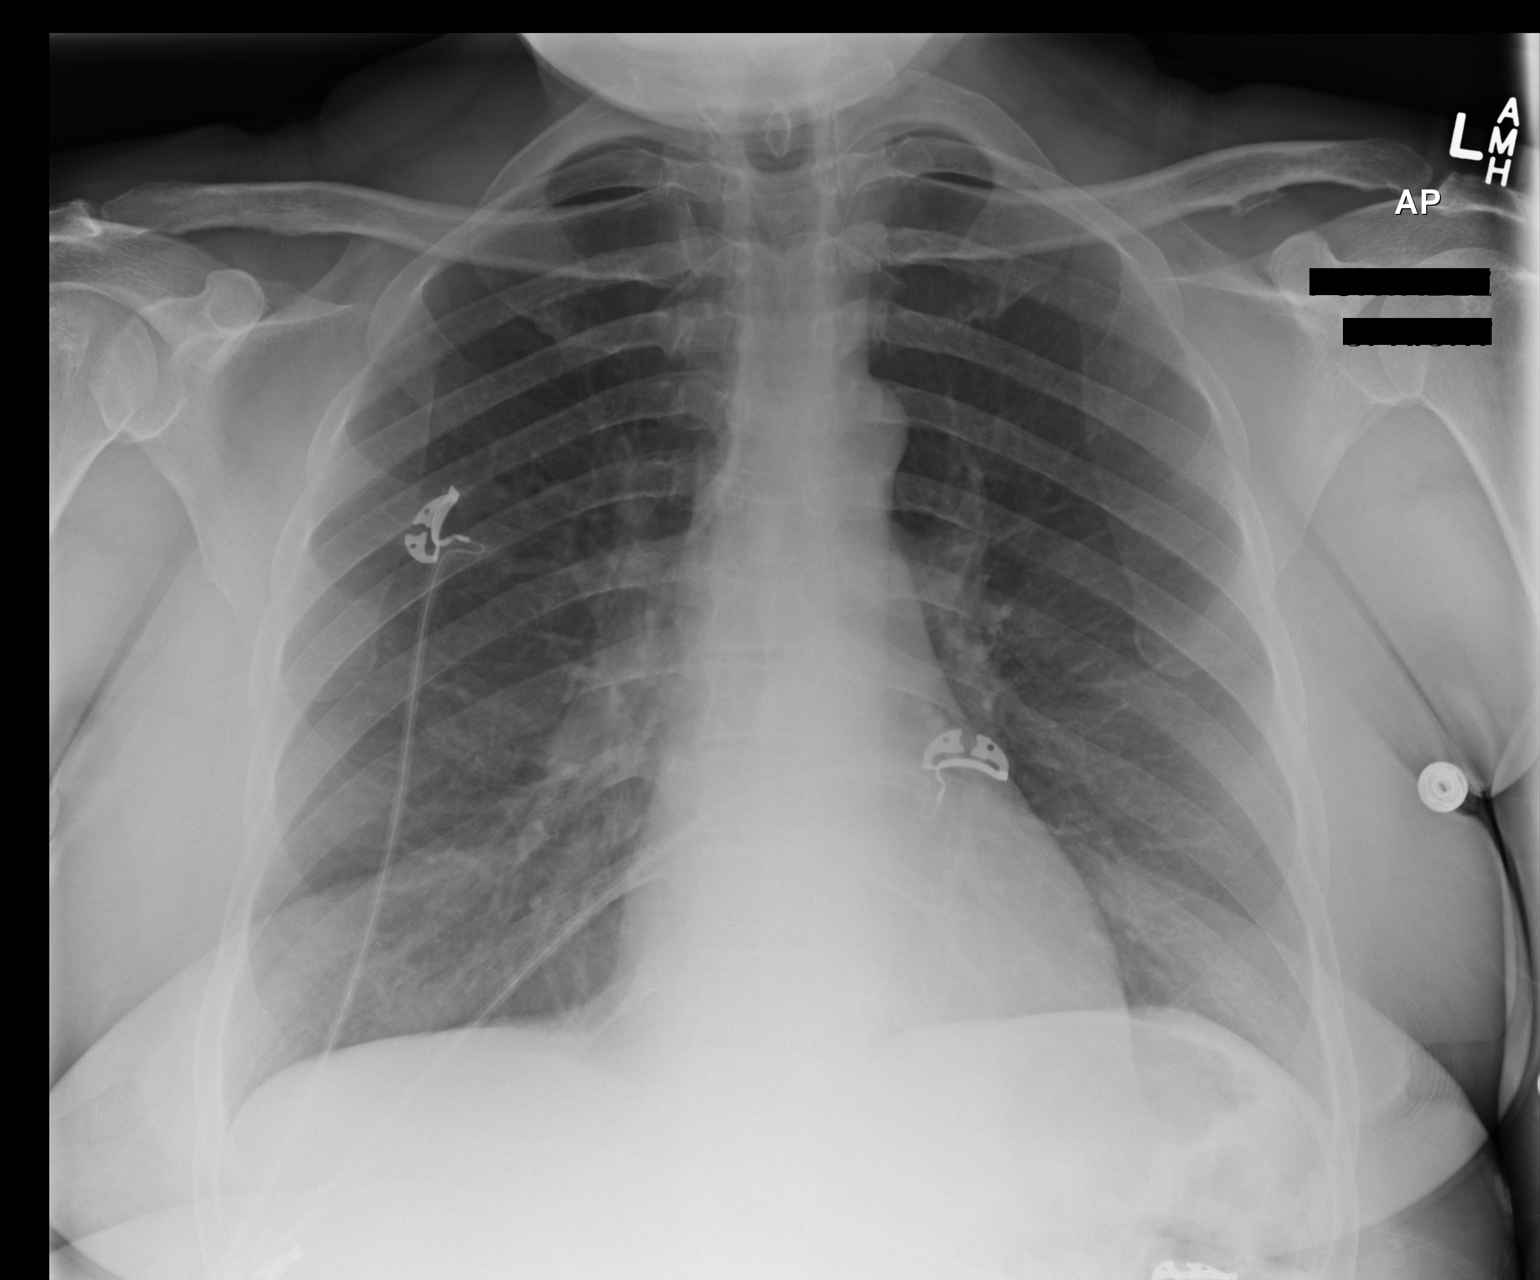

[1 of 1 positions shown; findings below may reference images not displayed]

FINDINGS: The examination is minimally degraded due to portable technique and
patient body habitus.

Normal cardiac silhouette and mediastinal contours. Apparent veiling
opacities overlying the bilateral lower lungs are favored to
represent overlying breast and soft tissues. No focal airspace
opacities. No pleural effusion or pneumothorax. No evidence of
edema. Unchanged bones.
IMPRESSION: No definite acute cardiopulmonary disease on this AP portable
examination. Further evaluation with a PA and lateral chest
radiograph may be obtained as clinically indicated.

## 2013-03-18 MED ORDER — HYDROCOD POLST-CHLORPHEN POLST 10-8 MG/5ML PO LQCR
5.0000 mL | Freq: Two times a day (BID) | ORAL | Status: DC
  Administered 2013-03-18 – 2013-03-20 (×5): 5 mL via ORAL
  Filled 2013-03-18 (×5): qty 5

## 2013-03-18 MED ORDER — BENZONATATE 100 MG PO CAPS
100.0000 mg | ORAL_CAPSULE | Freq: Three times a day (TID) | ORAL | Status: DC
  Administered 2013-03-18 – 2013-03-20 (×6): 100 mg via ORAL
  Filled 2013-03-18 (×10): qty 1

## 2013-03-18 MED ORDER — GUAIFENESIN ER 600 MG PO TB12
600.0000 mg | ORAL_TABLET | Freq: Two times a day (BID) | ORAL | Status: DC
  Administered 2013-03-18 – 2013-03-20 (×5): 600 mg via ORAL
  Filled 2013-03-18 (×8): qty 1

## 2013-03-18 NOTE — Progress Notes (Signed)
[facility] Progress Note [place] TEAM 1 - Stepdown/ICU TEAM   Laura [name] UJW:[id number] DOB: [birth year] DOA: 03/17/2013 PCP: Lenoard Aden, NP  Brief narrative: Laura [name] is a 52 y.o. female with h/o back pain came in to ED  with reports of black stools and hematemesis. She denies use of NSAIDS or steroids. She reports she had a cold since 4 days and was using alka seltzer and on the morning of admission she woke up at 2 am  to use the bathroom. She noticed black stools and had one episode of hematemesis. She had another episode in ED.  She was underwent an EGD revealed a mallory Weiss tear without active bleeding.  Pt notes that she has been coughing for days and has been congested as well. She is a smoker. No high fevers noted.    Assessment/Plan: Principal Problem:   Mallory-Weiss tear   Hematemesis   Melena - bleeding resolved-  - will go ahead and initiate clears liquids at dinner time- monitor for rebleeding  - cont Protonix infusion for today  Active Problems:    Cough - possible acute viral bronchitis - CXR performed today- negative for PNA - nebs, Tessalon perles, Tussionex and Mucinex    Acute blood loss anemia - Hgb drop from 11 to 9- follow in AM  Nicotine Abuse - counseled to quit   Code Status: full code Family Communication: none Disposition Plan: follow in SDU today  Consultants: GI  Procedures: EGD on 11/28  Antibiotics: none  DVT prophylaxis: SCDs  HPI/Subjective: Pt alert. Coughing- bringing up white sputum currently- no chest or abdominal pain- no BM yet    Objective: Blood pressure 114/71, pulse 83, temperature 98.1 F (36.7 C), temperature source Oral, resp. rate 16, height 5\' 6"  (1.676 m), weight 98 kg (216 lb 0.8 oz), SpO2 98.00%.  Intake/Output Summary (Last 24 hours) at 03/18/13 1500 Last data filed at 03/18/13 0600  Gross per 24 hour  Intake   1240 ml  Output    776 ml  Net    464 ml     Exam: General:  No acute respiratory distress Lungs: mild wheezing in right lung field Cardiovascular: Regular rate and rhythm without murmur gallop or rub normal S1 and S2 Abdomen: Nontender, nondistended, soft, bowel sounds positive, no rebound, no ascites, no appreciable mass Extremities: No significant cyanosis, clubbing, or edema bilateral lower extremities  Data Reviewed: Basic Metabolic Panel:  Recent Labs Lab 03/17/13 0531  NA 140  K 4.1  CL 103  CO2 24  GLUCOSE 107*  BUN 30*  CREATININE 0.55  CALCIUM 9.1   Liver Function Tests:  Recent Labs Lab 03/17/13 0531  AST 18  ALT 10  ALKPHOS 72  BILITOT 0.6  PROT 6.6  ALBUMIN 3.2*   No results found for this basename: LIPASE, AMYLASE,  in the last 168 hours No results found for this basename: AMMONIA,  in the last 168 hours CBC:  Recent Labs Lab 03/17/13 0531 03/17/13 1645 03/17/13 2340 03/18/13 0944  WBC 8.4  --   --   --   HGB 11.7* 10.2* 9.4* 9.1*  HCT 33.9* 28.6* 27.2* 25.9*  MCV 114.1*  --   --   --   PLT 189  --   --   --    Cardiac Enzymes: No results found for this basename: CKTOTAL, CKMB, CKMBINDEX, TROPONINI,  in the last 168 hours BNP (last 3 results) No results found for this basename: PROBNP,  in  the last 8760 hours CBG: No results found for this basename: GLUCAP,  in the last 168 hours  Recent Results (from the past 240 hour(s))  MRSA PCR SCREENING     Status: None   Collection Time    03/17/13  2:51 PM      Result Value Range Status   MRSA by PCR NEGATIVE  NEGATIVE Final   Comment:            The GeneXpert MRSA Assay (FDA     approved for NASAL specimens     only), is one component of a     comprehensive MRSA colonization     surveillance program. It is not     intended to diagnose MRSA     infection nor to guide or     monitor treatment for     MRSA infections.     Studies:  Recent x-ray studies have been reviewed in detail by the Attending Physician  Scheduled Meds:  Scheduled Meds: .  guaiFENesin  600 mg Oral BID  . sodium chloride  3 mL Intravenous Q12H   Continuous Infusions: . sodium chloride 50 mL/hr at 03/18/13 0400  . sodium chloride    . pantoprozole (PROTONIX) infusion 8 mg/hr (03/18/13 0600)    Time spent on care of this patient: 35 min   Noble Bodie, MD  [facility] Office  [phone] Pager - Text Page per Loretha Stapler as per below:  On-Call/Text Page:      [email or web address]      password [email or web address]  If 7PM-7AM, please contact night-coverage [email or web address] Password [email or web address] 03/18/2013, 3:00 PM   LOS: 1 day

## 2013-03-19 LAB — URINALYSIS, ROUTINE W REFLEX MICROSCOPIC
Bilirubin Urine: NEGATIVE
Glucose, UA: NEGATIVE mg/dL
Hgb urine dipstick: NEGATIVE
Ketones, ur: NEGATIVE mg/dL
Specific Gravity, Urine: 1.006 (ref 1.005–1.030)
pH: 5.5 (ref 5.0–8.0)

## 2013-03-19 LAB — HEMOGLOBIN AND HEMATOCRIT, BLOOD
Hemoglobin: 8.4 g/dL — ABNORMAL LOW (ref 12.0–15.0)
Hemoglobin: 8.1 g/dL — ABNORMAL LOW (ref 12.0–15.0)

## 2013-03-19 MED ORDER — PANTOPRAZOLE SODIUM 40 MG PO TBEC
40.0000 mg | DELAYED_RELEASE_TABLET | Freq: Two times a day (BID) | ORAL | Status: DC
  Administered 2013-03-19 – 2013-03-20 (×2): 40 mg via ORAL
  Filled 2013-03-19 (×2): qty 1

## 2013-03-19 NOTE — Progress Notes (Signed)
[facility] Progress Note [place] TEAM 1 - Stepdown/ICU TEAM   Laura [name] ZOX:[id number] DOB: [birth year] DOA: 03/17/2013 PCP: Lenoard Aden, NP  Brief narrative: 52 y.o. female with h/o back pain who came in to ED with reports of black stools and hematemesis. She denied use of NSAIDS or steroids. She reported she had a cold and was using alka seltzer.  On the morning of admission she woke up at 2 am to use the bathroom. She noticed black stools and had one episode of hematemesis. She had another episode in ED.   An EGD revealed a Mallory-Weiss tear without active bleeding.   Assessment/Plan:  Mallory-Weiss tear >> Hematemesis + Melena - bleeding appears to have resolved - advance to regular diet today  - PPI BID for 1 week then daily for 3 more weeks  Acute blood loss anemia - Hgb continues to slowly decline - follow until stable - no indication to transfuse unless Hgb drops below 7.0  Cough - possible acute viral bronchitis - CXR negative for PNA - nebs, Tessalon perles, Tussionex and Mucinex prn  Tobacco Abuse - counseled to quit  Code Status: FULL Family Communication: no family present at time of exam  Disposition Plan: transfer to medical bed - probable d/c home in AM if Hgb stable   Consultants: GI  Procedures: EGD on 11/28  Antibiotics: none  DVT prophylaxis: SCDs  HPI/Subjective: Doing well today.  Denies recurrent GIB.  No cp, sob, f/c, n/c, or abdom pain.  Is hungry.    Objective: Blood pressure 106/64, pulse 75, temperature 98.6 F (37 C), temperature source Oral, resp. rate 22, height 5\' 6"  (1.676 m), weight 98 kg (216 lb 0.8 oz), SpO2 100.00%.  Intake/Output Summary (Last 24 hours) at 03/19/13 1614 Last data filed at 03/19/13 1600  Gross per 24 hour  Intake   2990 ml  Output    250 ml  Net   2740 ml   Exam: General: No acute respiratory distress Lungs: CTA B w/o wheeze or focal crackles Cardiovascular: Regular rate and rhythm  without murmur gallop or rub normal S1 and S2 Abdomen: Nontender, nondistended, soft, bowel sounds positive, no rebound, no ascites, no appreciable mass Extremities: No significant cyanosis, clubbing, or edema bilateral lower extremities  Data Reviewed: Basic Metabolic Panel:  Recent Labs Lab 03/17/13 0531  NA 140  K 4.1  CL 103  CO2 24  GLUCOSE 107*  BUN 30*  CREATININE 0.55  CALCIUM 9.1   Liver Function Tests:  Recent Labs Lab 03/17/13 0531  AST 18  ALT 10  ALKPHOS 72  BILITOT 0.6  PROT 6.6  ALBUMIN 3.2*   CBC:  Recent Labs Lab 03/17/13 0531  03/18/13 0944 03/18/13 1542 03/18/13 2233 03/19/13 1033 03/19/13 1505  WBC 8.4  --   --   --   --   --   --   HGB 11.7*  < > 9.1* 9.8* 8.4* 8.4* 8.1*  HCT 33.9*  < > 25.9* 27.5* 24.0* 23.9* 23.1*  MCV 114.1*  --   --   --   --   --   --   PLT 189  --   --   --   --   --   --   < > = values in this interval not displayed.   Recent Results (from the past 240 hour(s))  MRSA PCR SCREENING     Status: None   Collection Time    03/17/13  2:51 PM  Result Value Range Status   MRSA by PCR NEGATIVE  NEGATIVE Final   Comment:            The GeneXpert MRSA Assay (FDA     approved for NASAL specimens     only), is one component of a     comprehensive MRSA colonization     surveillance program. It is not     intended to diagnose MRSA     infection nor to guide or     monitor treatment for     MRSA infections.     Studies:  Recent x-ray studies have been reviewed in detail by the Attending Physician  Scheduled Meds:  Scheduled Meds: . benzonatate  100 mg Oral TID  . chlorpheniramine-HYDROcodone  5 mL Oral Q12H  . guaiFENesin  600 mg Oral BID  . sodium chloride  3 mL Intravenous Q12H    Time spent on care of this patient: 35 min   MCCLUNG,JEFFREY T, MD  [facility] Office  [phone] Pager - Text Page per Loretha Stapler as per below:  On-Call/Text Page:      [email or web address]      password [email or web address]  If  7PM-7AM, please contact night-coverage [email or web address] Password [email or web address] 03/19/2013, 4:14 PM   LOS: 2 days

## 2013-03-20 ENCOUNTER — Encounter (HOSPITAL_COMMUNITY): Payer: Self-pay | Admitting: Gastroenterology

## 2013-03-20 LAB — BASIC METABOLIC PANEL
BUN: 7 mg/dL (ref 6–23)
CO2: 25 mEq/L (ref 19–32)
Calcium: 8.4 mg/dL (ref 8.4–10.5)
Creatinine, Ser: 0.53 mg/dL (ref 0.50–1.10)
GFR calc non Af Amer: >90 mL/min (ref >90)
Glucose, Bld: 87 mg/dL (ref 70–99)
Sodium: 138 mEq/L (ref 135–145)

## 2013-03-20 LAB — CBC
Hemoglobin: 9.2 g/dL — ABNORMAL LOW (ref 12.0–15.0)
MCH: 40 pg — ABNORMAL HIGH (ref 26.0–34.0)
MCHC: 35 g/dL (ref 30.0–36.0)
MCV: 114.3 fL — ABNORMAL HIGH (ref 78.0–100.0)
RBC: 2.3 MIL/uL — ABNORMAL LOW (ref 3.87–5.11)

## 2013-03-20 MED ORDER — PANTOPRAZOLE SODIUM 40 MG PO TBEC: 40.0000 mg | DELAYED_RELEASE_TABLET | Freq: Every day | ORAL | Status: DC

## 2013-03-20 MED ORDER — PANTOPRAZOLE SODIUM 40 MG PO TBEC: 40.0000 mg | DELAYED_RELEASE_TABLET | Freq: Two times a day (BID) | ORAL | Status: DC

## 2013-03-20 NOTE — Progress Notes (Signed)
Pt discharged in stable condition.  Discharge instructions given to pt, verbalized understanding.  Royden Purl   RN, BSN

## 2013-03-20 NOTE — Discharge Summary (Signed)
DISCHARGE SUMMARY  Laura [name]  MR#: [id number]  DOB:[birth year]  Date of Admission: 03/17/2013 Date of Discharge: 03/20/2013  Attending Physician:MCCLUNG,JEFFREY T  Patient's WUJ:WJXBJ, Mitzi Hansen, NP  Consults: [facility]  Disposition: D/C home   Follow-up Appts:     Follow-up Information   Follow up with Lenoard Aden, NP. Schedule an appointment as soon as possible for a visit in 7 days.   Specialty:  Nurse Practitioner   Contact information:   [address] [place] [address] [phone]      Discharge Diagnoses: Mallory-Weiss tear >> Hematemesis + Melena  Acute blood loss anemia  Cough  Tobacco Abuse   Initial presentation: 52 y.o. female with h/o back pain who came in to ED with reports of black stools and hematemesis. She denied use of NSAIDS or steroids. She reported she had a cold and was using alka seltzer. On the morning of admission she woke up at 2 am to use the bathroom. She noticed black stools and had one episode of hematemesis. She had another episode in ED.  An EGD revealed a Mallory-Weiss tear without active bleeding.   Hospital Course:  After admission, an EGD revealed a Mallory-Weiss tear without active bleeding.  Mallory-Weiss tear >> Hematemesis + Melena  - bleeding resolved spontaneously - advance to regular diet without difficulty  - PPI BID for 1 week post d/c then daily for 3 more weeks   Acute blood loss anemia  - Hgb stable/improving at time of d/c - nadir was 8.1, with increase to 9.2 at time of d/c - no indication to transfuse unless Hgb drops below 7.0   Cough  - possible acute viral bronchitis  - CXR negative for PNA  - nebs, Tessalon perles, Tussionex and Mucinex were given prn  - sx resolved time of d/c   Tobacco Abuse  - counseled to quit    Medication List    STOP taking these medications       sodium-potassium bicarbonate Tbef dissolvable tablet  Commonly known as:  ALKA-SELTZER GOLD      TAKE  these medications       albuterol 108 (90 BASE) MCG/ACT inhaler  Commonly known as:  PROVENTIL HFA;VENTOLIN HFA  Inhale 2 puffs into the lungs every 6 (six) hours as needed for wheezing. For shortness of breath     ALPRAZolam 0.5 MG tablet  Commonly known as:  XANAX  Take 1 tablet (0.5 mg total) by mouth daily as needed for anxiety.     NYQUIL COLD & FLU PO  Take 2 capsules by mouth at bedtime as needed ("cold").     oxyCODONE-acetaminophen 10-325 MG per tablet  Commonly known as:  PERCOCET  Take 1 tablet by mouth every 6 (six) hours as needed for pain.     pantoprazole 40 MG tablet  Commonly known as:  PROTONIX  Take 1 tablet (40 mg total) by mouth 2 (two) times daily.     pantoprazole 40 MG tablet  Commonly known as:  PROTONIX  Take 1 tablet (40 mg total) by mouth daily.  Start taking on:  03/27/2013       Day of Discharge BP 122/79  Pulse 67  Temp(Src) 98.3 F (36.8 C) (Oral)  Resp 18  Ht 5' 5.08" (1.653 m)  Wt 98 kg (216 lb 0.8 oz)  BMI 35.87 kg/m2  SpO2 100%  Physical Exam: General: No acute respiratory distress Lungs: Clear to auscultation bilaterally without wheezes or crackles Cardiovascular: Regular rate and  rhythm without murmur gallop or rub normal S1 and S2 Abdomen: Nontender, nondistended, soft, bowel sounds positive, no rebound, no ascites, no appreciable mass Extremities: No significant cyanosis, clubbing, or edema bilateral lower extremities  Results for orders placed during the hospital encounter of 03/17/13 (from the past 24 hour(s))  HEMOGLOBIN AND HEMATOCRIT, BLOOD     Status: Abnormal   Collection Time    03/19/13  3:05 PM      Result Value Range   Hemoglobin 8.1 (*) 12.0 - 15.0 g/dL   HCT 04.5 (*) 40.9 - 81.1 %  CBC     Status: Abnormal   Collection Time    03/20/13  6:02 AM      Result Value Range   WBC 5.3  4.0 - 10.5 K/uL   RBC 2.30 (*) 3.87 - 5.11 MIL/uL   Hemoglobin 9.2 (*) 12.0 - 15.0 g/dL   HCT 91.4 (*) 78.2 - 95.6 %   MCV 114.3  (*) 78.0 - 100.0 fL   MCH 40.0 (*) 26.0 - 34.0 pg   MCHC 35.0  30.0 - 36.0 g/dL   RDW 21.3  08.6 - 57.8 %   Platelets 139 (*) 150 - 400 K/uL  BASIC METABOLIC PANEL     Status: Abnormal   Collection Time    03/20/13  6:02 AM      Result Value Range   Sodium 138  135 - 145 mEq/L   Potassium 3.2 (*) 3.5 - 5.1 mEq/L   Chloride 106  96 - 112 mEq/L   CO2 25  19 - 32 mEq/L   Glucose, Bld 87  70 - 99 mg/dL   BUN 7  6 - 23 mg/dL   Creatinine, Ser 4.69  0.50 - 1.10 mg/dL   Calcium 8.4  8.4 - 62.9 mg/dL   GFR calc non Af Amer >90  >90 mL/min   GFR calc Af Amer >90  >90 mL/min    Time spent in discharge (includes decision making & examination of pt): >30 minutes  03/20/2013, 11:03 AM   Lonia Blood, MD [facility] Office  [phone] Pager [phone]  On-Call/Text Page:      [email or web address]      password [email or web address]

## 2013-03-29 ENCOUNTER — Other Ambulatory Visit: Payer: Self-pay | Admitting: Internal Medicine

## 2013-03-30 ENCOUNTER — Encounter: Payer: Self-pay | Admitting: Nurse Practitioner

## 2013-03-30 ENCOUNTER — Ambulatory Visit (INDEPENDENT_AMBULATORY_CARE_PROVIDER_SITE_OTHER): Payer: Medicare Other | Admitting: Nurse Practitioner

## 2013-03-30 VITALS — BP 126/84 | HR 90 | Temp 98.0°F | Resp 16 | Wt 226.6 lb

## 2013-03-30 DIAGNOSIS — R05 Cough: Secondary | ICD-10-CM

## 2013-03-30 DIAGNOSIS — D62 Acute posthemorrhagic anemia: Secondary | ICD-10-CM

## 2013-03-30 DIAGNOSIS — R059 Cough, unspecified: Secondary | ICD-10-CM

## 2013-03-30 MED ORDER — ALPRAZOLAM 0.5 MG PO TABS: 0.5000 mg | ORAL_TABLET | Freq: Every day | ORAL | Status: DC | PRN

## 2013-03-30 MED ORDER — ALBUTEROL SULFATE HFA 108 (90 BASE) MCG/ACT IN AERS: 2.0000 | INHALATION_SPRAY | Freq: Four times a day (QID) | RESPIRATORY_TRACT | Status: DC | PRN

## 2013-03-30 MED ORDER — BENZONATATE 100 MG PO CAPS: 100.0000 mg | ORAL_CAPSULE | Freq: Three times a day (TID) | ORAL | Status: DC | PRN

## 2013-03-30 NOTE — Progress Notes (Signed)
Patient ID: Laura [name], female   DOB: Nov 29, 1960, 52 y.o.   MRN: [id number]    Allergies  Allergen Reactions  . Aspirin Hives  . Penicillins Hives    Chief Complaint  Patient presents with  . Hospitalization Follow-up    Seen in ER on 03/17/2013- GI bleed   . other    chest congestion/cough with colored phelgm x1-2 weeks    HPI: Patient is a 52 y.o. female seen in the office today for hospital follow up from hospital; pt came went to ED with reports of black stools and hematemesis. An EGD revealed a Mallory-Weiss tear without active bleeding. pts hgb was 8.1 but increased to 9.2 on discharged; Bleeding has resolved spontaneously and diet was advanced without difficulty and was discharged on Protonix BID for 1 week post d/c then daily for 3 more weeks; currently taking once daily No other signs of bleeding, no black tarry stools, no vomiting or nausea   During admission pt with cough that was most likey acute viral bronchitis; CXR negative for PNA ; was given nebs, Tessalon perles, Tussionex and Mucinex were given prn in the hospital but sx resolved time of d/c from hospital; still coughing now has tried multiple medication over the counter, has been the same, worse at night  No fevers or chills, no shortness of breath or chest pains   Review of Systems:  Review of Systems  Constitutional: Negative for fever, chills and malaise/fatigue.  HENT: Negative for congestion, ear pain, nosebleeds, sore throat and tinnitus.   Respiratory: Positive for cough. Negative for shortness of breath and wheezing.   Cardiovascular: Negative for chest pain and palpitations.  Gastrointestinal: Negative for heartburn, abdominal pain, diarrhea and constipation.  Genitourinary: Negative for dysuria.  Skin: Negative.   Neurological: Negative for dizziness, weakness and headaches.     Past Medical History  Diagnosis Date  . Chronic back pain   . Other specified diseases of blood and blood-forming  organs(289.89)   . Tobacco use disorder   . Insomnia, unspecified   . Acute bronchitis   . Other malaise and fatigue   . Cough   . Complication of anesthesia     "didn't wake up"   Past Surgical History  Procedure Laterality Date  . Fracture surgery    . Tonsillectomy    . Esophagogastroduodenoscopy N/A 03/17/2013    Procedure: ESOPHAGOGASTRODUODENOSCOPY (EGD);  Surgeon: Meryl Dare, MD;  Location: [facility] ENDOSCOPY;  Service: Endoscopy;  Laterality: N/A;   Social History:   reports that she has been smoking.  She does not have any smokeless tobacco history on file. She reports that she drinks about 2.4 ounces of alcohol per week. She reports that she does not use illicit drugs.  Family History  Problem Relation Age of Onset  . Arthritis Mother   . Hypertension Mother   . Hypertension Sister   . Hypertension Brother   . Hypertension Sister   . Hypertension Sister   . Hypertension Sister     Medications: Patient's Medications  New Prescriptions   No medications on file  Previous Medications   ALBUTEROL (PROVENTIL HFA;VENTOLIN HFA) 108 (90 BASE) MCG/ACT INHALER    Inhale 2 puffs into the lungs every 6 (six) hours as needed for wheezing. For shortness of breath   ALPRAZOLAM (XANAX) 0.5 MG TABLET    TAKE 1 TABLET BY MOUTH DAILY AS NEEDED FOR ANXIETY   DM-DOXYLAMINE-ACETAMINOPHEN (NYQUIL COLD & FLU PO)    Take 2 capsules by mouth at  bedtime as needed ("cold").   OXYCODONE-ACETAMINOPHEN (PERCOCET) 10-325 MG PER TABLET    Take 1 tablet by mouth every 6 (six) hours as needed for pain.   PANTOPRAZOLE (PROTONIX) 40 MG TABLET    Take 1 tablet (40 mg total) by mouth daily.  Modified Medications   No medications on file  Discontinued Medications   PANTOPRAZOLE (PROTONIX) 40 MG TABLET    Take 1 tablet (40 mg total) by mouth 2 (two) times daily.     Physical Exam:  Filed Vitals:   03/30/13 1456  BP: 126/84  Pulse: 90  Temp: 98 F (36.7 C)  TempSrc: Oral  Resp: 16  Weight: 226  lb 9.6 oz (102.785 kg)  SpO2: 99%    Physical Exam  Constitutional: She is oriented to person, place, and time and well-developed, well-nourished, and in no distress.  HENT:  Head: Normocephalic and atraumatic.  Mouth/Throat: Oropharynx is clear and moist. No oropharyngeal exudate.  Neck: Normal range of motion. Neck supple.  Cardiovascular: Normal rate, regular rhythm and normal heart sounds.   Pulmonary/Chest: Effort normal. She has wheezes (bilateral upper lobes).  Abdominal: Soft. Bowel sounds are normal. She exhibits no distension.  Musculoskeletal: She exhibits no edema and no tenderness.  Neurological: She is alert and oriented to person, place, and time.  Skin: Skin is warm and dry. No erythema.  Psychiatric: Affect normal.     Labs reviewed: Basic Metabolic Panel:  Recent Labs  60/45/40 1607 06/14/12 1741 03/02/13 1605 03/17/13 0531 03/17/13 1645 03/20/13 0602  NA 138  --  139 140  --  138  K 4.9  --  4.1 4.1  --  3.2*  CL 99  --  94* 103  --  106  CO2 26  --  25 24  --  25  GLUCOSE 91  --  80 107*  --  87  BUN 10  --  7 30*  --  7  CREATININE 0.53  --  0.84 0.55  --  0.53  CALCIUM 10.2  --  9.8 9.1  --  8.4  MG  --  2.1  --   --   --   --   TSH  --  2.159  --   --  1.143  --    Liver Function Tests:  Recent Labs  06/14/12 1607 03/02/13 1605 03/17/13 0531  AST 35 70* 18  ALT 20 37* 10  ALKPHOS 108 108 72  BILITOT 0.9 1.3* 0.6  PROT 8.7* 7.1 6.6  ALBUMIN 4.2  --  3.2*    Recent Labs  06/14/12 1607  LIPASE 23   No results found for this basename: AMMONIA,  in the last 8760 hours CBC:  Recent Labs  06/14/12 1607 03/02/13 1605 03/17/13 0531  03/19/13 1033 03/19/13 1505 03/20/13 0602  WBC 4.9 3.3* 8.4  --   --   --  5.3  NEUTROABS 2.9 1.6  --   --   --   --   --   HGB 15.1* 14.0 11.7*  < > 8.4* 8.1* 9.2*  HCT 42.6 41.6 33.9*  < > 23.9* 23.1* 26.3*  MCV 102.4* 118* 114.1*  --   --   --  114.3*  PLT 178 159 189  --   --   --  139*  < >  = values in this interval not displayed. Lipid Panel:  Recent Labs  08/30/12 0840  HDL 92  LDLCALC 61  TRIG 67  CHOLHDL 1.8  TSH:  Recent Labs  06/14/12 1741 03/17/13 1645  TSH 2.159 1.143      Assessment/Plan 1. Acute blood loss anemia -no further blood loss noticed; feeling better -will follow up blood work CBC With differential/Platelet  2. Cough -unchanged; no fever, shortness of breath -encouraged smoking cessation -will have pt take albuterol every 8 hours for 5 days then as needed -mucinex DM 1-2 tablets q 12 hours for 1 week  - albuterol (PROVENTIL HFA;VENTOLIN HFA) 108 (90 BASE) MCG/ACT inhaler; Inhale 2 puffs into the lungs every 6 (six) hours as needed for wheezing. For shortness of breath  Dispense: 1 Inhaler; Refill: 2 - benzonatate (TESSALON) 100 MG capsule; Take 1 capsule (100 mg total) by mouth 3 (three) times daily as needed for cough.  Dispense: 90 capsule; Refill: 0  To follow up as needed

## 2013-03-30 NOTE — Patient Instructions (Signed)
Take albuterol around the clock every 8 hours for 5 days mucinex DM 2 tablet twice daily for 10 days Increase water intake Prescription for tessalon pearls sent to pharmacy  Call if no better or worse Call or seek medical attention if you develop fever or shortness of breath

## 2013-03-31 ENCOUNTER — Encounter: Payer: Self-pay | Admitting: *Deleted

## 2013-03-31 LAB — CBC WITH DIFFERENTIAL
Basophils Absolute: 0.1 10*3/uL (ref 0.0–0.2)
Eosinophils Absolute: 0.1 10*3/uL (ref 0.0–0.4)
Immature Grans (Abs): 0 10*3/uL (ref 0.0–0.1)
Immature Granulocytes: 0 %
MCH: 37.6 pg — ABNORMAL HIGH (ref 26.6–33.0)
MCHC: 33.8 g/dL (ref 31.5–35.7)
MCV: 112 fL — ABNORMAL HIGH (ref 79–97)
Monocytes Absolute: 0.5 10*3/uL (ref 0.1–0.9)
Neutrophils Relative %: 54 %
Platelets: 282 10*3/uL (ref 150–379)
RBC: 2.79 x10E6/uL — ABNORMAL LOW (ref 3.77–5.28)
RDW: 13.4 % (ref 12.3–15.4)

## 2013-04-07 ENCOUNTER — Other Ambulatory Visit: Payer: Self-pay | Admitting: *Deleted

## 2013-04-07 DIAGNOSIS — M5441 Lumbago with sciatica, right side: Secondary | ICD-10-CM

## 2013-04-07 MED ORDER — OXYCODONE-ACETAMINOPHEN 10-325 MG PO TABS: 1.0000 | ORAL_TABLET | Freq: Four times a day (QID) | ORAL | Status: DC | PRN

## 2013-05-01 ENCOUNTER — Other Ambulatory Visit: Payer: Self-pay | Admitting: *Deleted

## 2013-05-01 MED ORDER — ALPRAZOLAM 0.5 MG PO TABS: 0.5000 mg | ORAL_TABLET | Freq: Every day | ORAL | Status: DC | PRN

## 2013-05-08 ENCOUNTER — Other Ambulatory Visit: Payer: Self-pay | Admitting: *Deleted

## 2013-05-08 MED ORDER — OXYCODONE-ACETAMINOPHEN 10-325 MG PO TABS: 1.0000 | ORAL_TABLET | Freq: Four times a day (QID) | ORAL | Status: DC | PRN

## 2013-06-08 ENCOUNTER — Other Ambulatory Visit: Payer: Self-pay | Admitting: *Deleted

## 2013-06-08 MED ORDER — OXYCODONE-ACETAMINOPHEN 10-325 MG PO TABS: 1.0000 | ORAL_TABLET | Freq: Four times a day (QID) | ORAL | Status: DC | PRN

## 2013-06-08 MED ORDER — ALPRAZOLAM 0.5 MG PO TABS: 0.5000 mg | ORAL_TABLET | Freq: Every day | ORAL | Status: DC | PRN

## 2013-07-06 ENCOUNTER — Other Ambulatory Visit: Payer: Self-pay | Admitting: *Deleted

## 2013-07-06 MED ORDER — ALPRAZOLAM 0.5 MG PO TABS: 0.5000 mg | ORAL_TABLET | Freq: Every day | ORAL | Status: DC | PRN

## 2013-07-06 MED ORDER — OXYCODONE-ACETAMINOPHEN 10-325 MG PO TABS: 1.0000 | ORAL_TABLET | Freq: Four times a day (QID) | ORAL | Status: DC | PRN

## 2013-08-04 ENCOUNTER — Other Ambulatory Visit: Payer: Self-pay | Admitting: *Deleted

## 2013-08-04 MED ORDER — ALPRAZOLAM 0.5 MG PO TABS: 0.5000 mg | ORAL_TABLET | Freq: Every day | ORAL | Status: DC | PRN

## 2013-08-04 MED ORDER — OXYCODONE-ACETAMINOPHEN 10-325 MG PO TABS: 1.0000 | ORAL_TABLET | Freq: Four times a day (QID) | ORAL | Status: DC | PRN

## 2013-09-04 ENCOUNTER — Other Ambulatory Visit: Payer: Self-pay | Admitting: *Deleted

## 2013-09-04 MED ORDER — ALPRAZOLAM 0.5 MG PO TABS: 0.5000 mg | ORAL_TABLET | Freq: Every day | ORAL | Status: DC | PRN

## 2013-09-04 MED ORDER — OXYCODONE-ACETAMINOPHEN 10-325 MG PO TABS: 1.0000 | ORAL_TABLET | Freq: Four times a day (QID) | ORAL | Status: DC | PRN

## 2013-10-05 ENCOUNTER — Other Ambulatory Visit: Payer: Self-pay | Admitting: *Deleted

## 2013-10-05 MED ORDER — ALPRAZOLAM 0.5 MG PO TABS: 0.5000 mg | ORAL_TABLET | Freq: Every day | ORAL | Status: DC | PRN

## 2013-10-05 MED ORDER — OXYCODONE-ACETAMINOPHEN 10-325 MG PO TABS: 1.0000 | ORAL_TABLET | Freq: Four times a day (QID) | ORAL | Status: DC | PRN

## 2013-11-03 ENCOUNTER — Other Ambulatory Visit: Payer: Self-pay | Admitting: *Deleted

## 2013-11-03 MED ORDER — ALPRAZOLAM 0.5 MG PO TABS: 0.5000 mg | ORAL_TABLET | Freq: Every day | ORAL | Status: DC | PRN

## 2013-11-03 MED ORDER — OXYCODONE-ACETAMINOPHEN 10-325 MG PO TABS: 1.0000 | ORAL_TABLET | Freq: Four times a day (QID) | ORAL | Status: DC | PRN

## 2013-12-04 ENCOUNTER — Other Ambulatory Visit: Payer: Self-pay

## 2013-12-04 MED ORDER — ALPRAZOLAM 0.5 MG PO TABS: 0.5000 mg | ORAL_TABLET | Freq: Every day | ORAL | Status: DC | PRN

## 2013-12-04 MED ORDER — OXYCODONE-ACETAMINOPHEN 10-325 MG PO TABS: 1.0000 | ORAL_TABLET | Freq: Four times a day (QID) | ORAL | Status: DC | PRN

## 2013-12-04 NOTE — Telephone Encounter (Signed)
Patient called triage line requesting refills, rx's available for pick-up

## 2014-01-04 ENCOUNTER — Other Ambulatory Visit: Payer: Self-pay | Admitting: *Deleted

## 2014-01-04 MED ORDER — ALPRAZOLAM 0.5 MG PO TABS: 0.5000 mg | ORAL_TABLET | Freq: Every day | ORAL | Status: DC | PRN

## 2014-01-04 MED ORDER — OXYCODONE-ACETAMINOPHEN 10-325 MG PO TABS: 1.0000 | ORAL_TABLET | Freq: Four times a day (QID) | ORAL | Status: DC | PRN

## 2014-02-02 ENCOUNTER — Other Ambulatory Visit: Payer: Self-pay | Admitting: *Deleted

## 2014-02-02 MED ORDER — OXYCODONE-ACETAMINOPHEN 10-325 MG PO TABS: 1.0000 | ORAL_TABLET | Freq: Four times a day (QID) | ORAL | Status: DC | PRN

## 2014-02-02 MED ORDER — ALPRAZOLAM 0.5 MG PO TABS: 0.5000 mg | ORAL_TABLET | Freq: Every day | ORAL | Status: DC | PRN

## 2014-02-02 NOTE — Telephone Encounter (Signed)
Patient Requested and will pick up 

## 2014-03-05 ENCOUNTER — Other Ambulatory Visit: Payer: Self-pay | Admitting: *Deleted

## 2014-03-05 MED ORDER — ALPRAZOLAM 0.5 MG PO TABS: 0.5000 mg | ORAL_TABLET | Freq: Every day | ORAL | Status: DC | PRN

## 2014-03-05 MED ORDER — OXYCODONE-ACETAMINOPHEN 10-325 MG PO TABS: 1.0000 | ORAL_TABLET | Freq: Four times a day (QID) | ORAL | Status: DC | PRN

## 2014-03-05 NOTE — Telephone Encounter (Signed)
Patient Requested and will pick up 

## 2014-03-07 ENCOUNTER — Encounter: Payer: Medicare Other | Admitting: Nurse Practitioner

## 2014-03-22 ENCOUNTER — Encounter: Payer: Medicare Other | Admitting: Nurse Practitioner

## 2014-04-03 ENCOUNTER — Other Ambulatory Visit: Payer: Self-pay | Admitting: *Deleted

## 2014-04-03 MED ORDER — ALPRAZOLAM 0.5 MG PO TABS: 0.5000 mg | ORAL_TABLET | Freq: Every day | ORAL | Status: DC | PRN

## 2014-04-03 MED ORDER — OXYCODONE-ACETAMINOPHEN 10-325 MG PO TABS: 1.0000 | ORAL_TABLET | Freq: Four times a day (QID) | ORAL | Status: DC | PRN

## 2014-04-03 NOTE — Telephone Encounter (Signed)
Patient stated that she fell off the back of truck and needs to pick up Rx for pain medication today instead of tomorrow because her mom is coming in out of town. Rx printed for pick up

## 2014-04-19 ENCOUNTER — Encounter: Payer: Self-pay | Admitting: Nurse Practitioner

## 2014-04-19 ENCOUNTER — Ambulatory Visit (INDEPENDENT_AMBULATORY_CARE_PROVIDER_SITE_OTHER): Payer: Medicare Other

## 2014-04-19 ENCOUNTER — Ambulatory Visit (INDEPENDENT_AMBULATORY_CARE_PROVIDER_SITE_OTHER): Payer: Medicare Other | Admitting: Nurse Practitioner

## 2014-04-19 VITALS — BP 126/78 | HR 66 | Temp 96.6°F | Resp 18 | Ht 65.0 in | Wt 216.4 lb

## 2014-04-19 DIAGNOSIS — R059 Cough, unspecified: Secondary | ICD-10-CM

## 2014-04-19 DIAGNOSIS — Z23 Encounter for immunization: Secondary | ICD-10-CM

## 2014-04-19 DIAGNOSIS — R05 Cough: Secondary | ICD-10-CM

## 2014-04-19 DIAGNOSIS — G4700 Insomnia, unspecified: Secondary | ICD-10-CM

## 2014-04-19 DIAGNOSIS — Z0000 Encounter for general adult medical examination without abnormal findings: Secondary | ICD-10-CM

## 2014-04-19 DIAGNOSIS — F419 Anxiety disorder, unspecified: Secondary | ICD-10-CM

## 2014-04-19 MED ORDER — DULOXETINE HCL 30 MG PO CPEP: 30.0000 mg | ORAL_CAPSULE | Freq: Every day | ORAL | Status: DC

## 2014-04-19 MED ORDER — ALBUTEROL SULFATE HFA 108 (90 BASE) MCG/ACT IN AERS: 2.0000 | INHALATION_SPRAY | Freq: Four times a day (QID) | RESPIRATORY_TRACT | Status: DC | PRN

## 2014-04-19 NOTE — Patient Instructions (Addendum)
Start Cymbalta 30 mg daily for anxiety this will also help with pain  May take melatonin 3 mg at night to help with sleep   Follow up in 4-6 weeks to follow up  New medication  Insomnia Insomnia is frequent trouble falling and/or staying asleep. Insomnia can be a long term problem or a short term problem. Both are common. Insomnia can be a short term problem when the wakefulness is related to a certain stress or worry. Long term insomnia is often related to ongoing stress during waking hours and/or poor sleeping habits. Overtime, sleep deprivation itself can make the problem worse. Every little thing feels more severe because you are overtired and your ability to cope is decreased. CAUSES   Stress, anxiety, and depression.  Poor sleeping habits.  Distractions such as TV in the bedroom.  Naps close to bedtime.  Engaging in emotionally charged conversations before bed.  Technical reading before sleep.  Alcohol and other sedatives. They may make the problem worse. They can hurt normal sleep patterns and normal dream activity.  Stimulants such as caffeine for several hours prior to bedtime.  Pain syndromes and shortness of breath can cause insomnia.  Exercise late at night.  Changing time zones may cause sleeping problems (jet lag). It is sometimes helpful to have someone observe your sleeping patterns. They should look for periods of not breathing during the night (sleep apnea). They should also look to see how long those periods last. If you live alone or observers are uncertain, you can also be observed at a sleep clinic where your sleep patterns will be professionally monitored. Sleep apnea requires a checkup and treatment. Give your caregivers your medical history. Give your caregivers observations your family has made about your sleep.  SYMPTOMS   Not feeling rested in the morning.  Anxiety and restlessness at bedtime.  Difficulty falling and staying asleep. TREATMENT    Your caregiver may prescribe treatment for an underlying medical disorders. Your caregiver can give advice or help if you are using alcohol or other drugs for self-medication. Treatment of underlying problems will usually eliminate insomnia problems.  Medications can be prescribed for short time use. They are generally not recommended for lengthy use.  Over-the-counter sleep medicines are not recommended for lengthy use. They can be habit forming.  You can promote easier sleeping by making lifestyle changes such as:  Using relaxation techniques that help with breathing and reduce muscle tension.  Exercising earlier in the day.  Changing your diet and the time of your last meal. No night time snacks.  Establish a regular time to go to bed.  Counseling can help with stressful problems and worry.  Soothing music and white noise may be helpful if there are background noises you cannot remove.  Stop tedious detailed work at least one hour before bedtime. HOME CARE INSTRUCTIONS   Keep a diary. Inform your caregiver about your progress. This includes any medication side effects. See your caregiver regularly. Take note of:  Times when you are asleep.  Times when you are awake during the night.  The quality of your sleep.  How you feel the next day. This information will help your caregiver care for you.  Get out of bed if you are still awake after 15 minutes. Read or do some quiet activity. Keep the lights down. Wait until you feel sleepy and go back to bed.  Keep regular sleeping and waking hours. Avoid naps.  Exercise regularly.  Avoid distractions at bedtime.  Distractions include watching television or engaging in any intense or detailed activity like attempting to balance the household checkbook.  Develop a bedtime ritual. Keep a familiar routine of bathing, brushing your teeth, climbing into bed at the same time each night, listening to soothing music. Routines increase the  success of falling to sleep faster.  Use relaxation techniques. This can be using breathing and muscle tension release routines. It can also include visualizing peaceful scenes. You can also help control troubling or intruding thoughts by keeping your mind occupied with boring or repetitive thoughts like the old concept of counting sheep. You can make it more creative like imagining planting one beautiful flower after another in your backyard garden.  During your day, work to eliminate stress. When this is not possible use some of the previous suggestions to help reduce the anxiety that accompanies stressful situations. MAKE SURE YOU:   Understand these instructions.  Will watch your condition.  Will get help right away if you are not doing well or get worse. Document Released: 04/03/2000 Document Revised: 06/29/2011 Document Reviewed: 05/04/2007 [facility] Patient Information 2015 [place]. This information is not intended to replace advice given to you by your health care provider. Make sure you discuss any questions you have with your health care provider.

## 2014-04-19 NOTE — Progress Notes (Signed)
Patient ID: Laura KiefKathy [name], female   DOB: [birth year], 53 y.o.   MRN: [id number]    PCP: Sharon SellerEUBANKS, Chyler Creely K, NP  Allergies  Allergen Reactions  . Aspirin Hives  . Penicillins Hives    Chief Complaint  Patient presents with  . Medical Management of Chronic Issues     HPI: Patient is a 53 y.o. female seen in the office today for extended visit. Does not wish to take clothes off for skin review or female exams. Does look over skin for irregular or new moles.  Does not get mammograms, does self breast exams no abnormal exam Does not wish to have PAP or vaginal exam, no problems, aware of risk Not sleeping at night, having to take 2 xanax to help sleep Ambien and trazodone gave her nightmares. Needing xanax about every other day for panic attacks.  Having back pain, feel out of the back of a truck, not able to do much physical activity Not on any special diet.  Still smoking-- has cut back  Only using inhaler occasionally   Will take flu vaccine  Declines colonoscopy, reports she sent her stool off per her insurance    Review of Systems:  Review of Systems  Constitutional: Negative for activity change, appetite change, fatigue and unexpected weight change.  HENT: Negative for congestion and hearing loss.   Eyes: Negative.   Respiratory: Negative for cough and shortness of breath.   Cardiovascular: Negative for chest pain, palpitations and leg swelling.  Gastrointestinal: Negative for abdominal pain, diarrhea and constipation.  Genitourinary: Negative for dysuria and difficulty urinating.  Musculoskeletal: Negative for myalgias and arthralgias.  Skin: Negative for color change and wound.  Neurological: Negative for dizziness and weakness.  Psychiatric/Behavioral: Negative for behavioral problems, confusion and agitation.       Has anxiety    Past Medical History  Diagnosis Date  . Chronic back pain   . Other specified diseases of blood and blood-forming organs(289.89)   .  Tobacco use disorder   . Insomnia, unspecified   . Acute bronchitis   . Other malaise and fatigue   . Cough   . Complication of anesthesia     "didn't wake up"   Past Surgical History  Procedure Laterality Date  . Fracture surgery    . Tonsillectomy    . Esophagogastroduodenoscopy N/A 03/17/2013    Procedure: ESOPHAGOGASTRODUODENOSCOPY (EGD);  Surgeon: Meryl DareMalcolm T Stark, MD;  [facility];  Service: Endoscopy;  Laterality: N/A;   Social History:   reports that she has been smoking.  She does not have any smokeless tobacco history on file. She reports that she drinks about 2.4 oz of alcohol per week. She reports that she does not use illicit drugs.  Family History  Problem Relation Age of Onset  . Arthritis Mother   . Hypertension Mother   . Hypertension Sister   . Hypertension Brother   . Hypertension Sister   . Hypertension Sister   . Hypertension Sister     Medications: Patient's Medications  New Prescriptions   No medications on file  Previous Medications   ALBUTEROL (PROVENTIL HFA;VENTOLIN HFA) 108 (90 BASE) MCG/ACT INHALER    Inhale 2 puffs into the lungs every 6 (six) hours as needed for wheezing. For shortness of breath   ALPRAZOLAM (XANAX) 0.5 MG TABLET    Take 1 tablet (0.5 mg total) by mouth daily as needed for anxiety.   BENZONATATE (TESSALON) 100 MG CAPSULE    Take 1 capsule (100 mg  total) by mouth 3 (three) times daily as needed for cough.   OXYCODONE-ACETAMINOPHEN (PERCOCET) 10-325 MG PER TABLET    Take 1 tablet by mouth every 6 (six) hours as needed for pain.   PANTOPRAZOLE (PROTONIX) 40 MG TABLET    Take 1 tablet (40 mg total) by mouth daily.  Modified Medications   No medications on file  Discontinued Medications   No medications on file     Physical Exam:  Filed Vitals:   04/19/14 1104  BP: 126/78  Pulse: 66  Temp: 96.6 F (35.9 C)  TempSrc: Oral  Resp: 18  Height: 5\' 5"  (1.651 m)  Weight: 216 lb 6.4 oz (98.158 kg)  SpO2: 97%     Physical Exam  Constitutional: She is oriented to person, place, and time. She appears well-developed and well-nourished. No distress.  HENT:  Head: Normocephalic and atraumatic.  Mouth/Throat: Oropharynx is clear and moist. No oropharyngeal exudate.  Eyes: Conjunctivae and EOM are normal. Pupils are equal, round, and reactive to light.  Neck: Normal range of motion.  Cardiovascular: Normal rate, regular rhythm and normal heart sounds.   Pulmonary/Chest: Effort normal and breath sounds normal.  Abdominal: Soft. Bowel sounds are normal.  Genitourinary:  Declines exam  Musculoskeletal: She exhibits no edema or tenderness.  Neurological: She is alert and oriented to person, place, and time.  Skin: Skin is warm and dry. She is not diaphoretic.  Psychiatric: She has a normal mood and affect.    Labs reviewed: Basic Metabolic Panel: No results for input(s): NA, K, CL, CO2, GLUCOSE, BUN, CREATININE, CALCIUM, MG, PHOS, TSH in the last 8760 hours. Liver Function Tests: No results for input(s): AST, ALT, ALKPHOS, BILITOT, PROT, ALBUMIN in the last 8760 hours. No results for input(s): LIPASE, AMYLASE in the last 8760 hours. No results for input(s): AMMONIA in the last 8760 hours. CBC: No results for input(s): WBC, NEUTROABS, HGB, HCT, MCV, PLT in the last 8760 hours. Lipid Panel: No results for input(s): CHOL, HDL, LDLCALC, TRIG, CHOLHDL, LDLDIRECT in the last 8760 hours. TSH: No results for input(s): TSH in the last 8760 hours. A1C: No results found for: HGBA1C   Assessment/Plan 1. Anxiety -with panic attacks, conts xanax as needed will start Cymbalta daily - DULoxetine (CYMBALTA) 30 MG capsule; Take 1 capsule (30 mg total) by mouth daily.  Dispense: 30 capsule; Refill: 1  2. Preventative health care -PREVENTIVE COUNSELING:  The patient was counseled regarding the appropriate use of alcohol, regular self-examination of the breasts on a monthly basis, prevention of dental and  periodontal disease, diet, regular sustained exercise for at least 30 minutes 5 times per week, routine screening interval for mammogram as recommended by the American Cancer Society and ACOG, importance of regular PAP smears, smoking cessation, tobacco use, and recommended schedule for GI hemoccult testing, colonoscopy, cholesterol, thyroid and diabetes screening. Pt does not wish to have mammogram, colonoscopy, PAP at this time Aware of risk conts to smoke, discussed smoking cessation - CBC With differential/Platelet - Comprehensive metabolic panel  3. Insomnia Discussed sleep techniques and proper sleep habits -may use melatonin 3 mg qhs to help with sleep

## 2014-04-20 LAB — COMPREHENSIVE METABOLIC PANEL
ALK PHOS: 83 IU/L (ref 39–117)
ALT: 11 IU/L (ref 0–32)
AST: 18 IU/L (ref 0–40)
Albumin/Globulin Ratio: 1.6 (ref 1.1–2.5)
Albumin: 4.3 g/dL (ref 3.5–5.5)
BILIRUBIN TOTAL: 0.4 mg/dL (ref 0.0–1.2)
BUN / CREAT RATIO: 11 (ref 9–23)
BUN: 9 mg/dL (ref 6–24)
CO2: 25 mmol/L (ref 18–29)
CREATININE: 0.79 mg/dL (ref 0.57–1.00)
Calcium: 9.9 mg/dL (ref 8.7–10.2)
Chloride: 100 mmol/L (ref 97–108)
GFR, EST AFRICAN AMERICAN: 100 mL/min/{1.73_m2} (ref >59)
GFR, EST NON AFRICAN AMERICAN: 87 mL/min/{1.73_m2} (ref >59)
GLOBULIN, TOTAL: 2.7 g/dL (ref 1.5–4.5)
GLUCOSE: 117 mg/dL — AB (ref 65–99)
Potassium: 4.8 mmol/L (ref 3.5–5.2)
Sodium: 142 mmol/L (ref 134–144)
Total Protein: 7 g/dL (ref 6.0–8.5)

## 2014-04-20 LAB — CBC WITH DIFFERENTIAL
Basophils Absolute: 0 10*3/uL (ref 0.0–0.2)
Basos: 1 %
EOS: 1 %
Eosinophils Absolute: 0 10*3/uL (ref 0.0–0.4)
HCT: 37.1 % (ref 34.0–46.6)
Hemoglobin: 13 g/dL (ref 11.1–15.9)
Immature Grans (Abs): 0 10*3/uL (ref 0.0–0.1)
Immature Granulocytes: 0 %
LYMPHS ABS: 1 10*3/uL (ref 0.7–3.1)
LYMPHS: 27 %
MCH: 39.5 pg — ABNORMAL HIGH (ref 26.6–33.0)
MCHC: 35 g/dL (ref 31.5–35.7)
MCV: 113 fL — ABNORMAL HIGH (ref 79–97)
MONOCYTES: 14 %
Monocytes Absolute: 0.5 10*3/uL (ref 0.1–0.9)
NEUTROS PCT: 57 %
Neutrophils Absolute: 2.2 10*3/uL (ref 1.4–7.0)
Platelets: 243 10*3/uL (ref 150–379)
RBC: 3.29 x10E6/uL — AB (ref 3.77–5.28)
RDW: 13.4 % (ref 12.3–15.4)
WBC: 3.9 10*3/uL (ref 3.4–10.8)

## 2014-04-23 ENCOUNTER — Other Ambulatory Visit: Payer: Self-pay | Admitting: *Deleted

## 2014-04-23 DIAGNOSIS — F419 Anxiety disorder, unspecified: Secondary | ICD-10-CM

## 2014-04-23 MED ORDER — DULOXETINE HCL 30 MG PO CPEP: 30.0000 mg | ORAL_CAPSULE | Freq: Every day | ORAL | Status: DC

## 2014-04-23 NOTE — Telephone Encounter (Signed)
Patient called requesting because wasn't called in at appointment time. Refaxed.

## 2014-04-24 ENCOUNTER — Encounter: Payer: Self-pay | Admitting: *Deleted

## 2014-04-25 LAB — B12 AND FOLATE PANEL
Folate: 3.9 ng/mL (ref >3.0)
Vitamin B-12: 302 pg/mL (ref 211–946)

## 2014-04-25 LAB — SPECIMEN STATUS REPORT

## 2014-05-04 ENCOUNTER — Other Ambulatory Visit: Payer: Self-pay | Admitting: *Deleted

## 2014-05-04 MED ORDER — OXYCODONE-ACETAMINOPHEN 10-325 MG PO TABS: 1.0000 | ORAL_TABLET | Freq: Four times a day (QID) | ORAL | Status: DC | PRN

## 2014-05-04 MED ORDER — ALPRAZOLAM 0.5 MG PO TABS: 0.5000 mg | ORAL_TABLET | Freq: Every day | ORAL | Status: DC | PRN

## 2014-05-04 NOTE — Telephone Encounter (Signed)
Patient Requested and will pick up 

## 2014-05-21 ENCOUNTER — Ambulatory Visit: Payer: Medicare Other | Admitting: Nurse Practitioner

## 2014-06-05 ENCOUNTER — Other Ambulatory Visit: Payer: Self-pay | Admitting: *Deleted

## 2014-06-05 MED ORDER — OXYCODONE-ACETAMINOPHEN 10-325 MG PO TABS: 1.0000 | ORAL_TABLET | Freq: Four times a day (QID) | ORAL | Status: DC | PRN

## 2014-06-05 MED ORDER — ALPRAZOLAM 0.5 MG PO TABS: 0.5000 mg | ORAL_TABLET | Freq: Every day | ORAL | Status: DC | PRN

## 2014-06-05 NOTE — Telephone Encounter (Signed)
Patient Requested and will pick up 

## 2014-06-07 ENCOUNTER — Ambulatory Visit (INDEPENDENT_AMBULATORY_CARE_PROVIDER_SITE_OTHER): Payer: Medicare Other | Admitting: Nurse Practitioner

## 2014-06-07 ENCOUNTER — Encounter: Payer: Self-pay | Admitting: Nurse Practitioner

## 2014-06-07 VITALS — BP 130/80 | HR 87 | Temp 98.1°F | Resp 20 | Ht 65.0 in | Wt 217.2 lb

## 2014-06-07 DIAGNOSIS — M170 Bilateral primary osteoarthritis of knee: Secondary | ICD-10-CM

## 2014-06-07 DIAGNOSIS — G4700 Insomnia, unspecified: Secondary | ICD-10-CM

## 2014-06-07 DIAGNOSIS — F411 Generalized anxiety disorder: Secondary | ICD-10-CM

## 2014-06-07 NOTE — Progress Notes (Signed)
Patient ID: Laura [name], female   DOB: July 21, 1960, 54 y.o.   MRN: [id number]    PCP: Sharon Seller, NP  Allergies  Allergen Reactions  . Aspirin Hives  . Penicillins Hives    Chief Complaint  Patient presents with  . Medical Management of Chronic Issues     HPI: Patient is a 54 y.o. female seen in the office today for follow up on anxiety. Was started on Cymbalta at last visit. Overall has tolerated the medication well. No Side effects noted. No SI or HI. Reports her anxiety attacks are better. Sleeping better at night and pain has improved.    Review of Systems:  Review of Systems  Constitutional: Negative for activity change, appetite change, fatigue and unexpected weight change.  Eyes: Negative.   Respiratory: Negative for cough and shortness of breath.   Cardiovascular: Negative for chest pain, palpitations and leg swelling.  Gastrointestinal: Negative for diarrhea and constipation.  Musculoskeletal: Negative for myalgias and arthralgias.  Skin: Negative for color change and wound.  Neurological: Negative for dizziness, tremors and weakness.  Psychiatric/Behavioral: Negative for behavioral problems, confusion and agitation. The patient is nervous/anxious (has improved).     Past Medical History  Diagnosis Date  . Chronic back pain   . Other specified diseases of blood and blood-forming organs(289.89)   . Tobacco use disorder   . Insomnia, unspecified   . Acute bronchitis   . Other malaise and fatigue   . Cough   . Complication of anesthesia     "didn't wake up"   Past Surgical History  Procedure Laterality Date  . Fracture surgery    . Tonsillectomy    . Esophagogastroduodenoscopy N/A 03/17/2013    Procedure: ESOPHAGOGASTRODUODENOSCOPY (EGD);  Surgeon: Meryl Dare, MD;  Location: [facility] ENDOSCOPY;  Service: Endoscopy;  Laterality: N/A;   Social History:   reports that she has been smoking.  She does not have any smokeless tobacco history on file. She  reports that she drinks about 2.4 oz of alcohol per week. She reports that she does not use illicit drugs.  Family History  Problem Relation Age of Onset  . Arthritis Mother   . Hypertension Mother   . Hypertension Sister   . Hypertension Brother   . Hypertension Sister   . Hypertension Sister   . Hypertension Sister     Medications: Patient's Medications  New Prescriptions   No medications on file  Previous Medications   ALBUTEROL (PROVENTIL HFA;VENTOLIN HFA) 108 (90 BASE) MCG/ACT INHALER    Inhale 2 puffs into the lungs every 6 (six) hours as needed for wheezing. For shortness of breath   ALPRAZOLAM (XANAX) 0.5 MG TABLET    Take 1 tablet (0.5 mg total) by mouth daily as needed for anxiety.   DULOXETINE (CYMBALTA) 30 MG CAPSULE    Take 1 capsule (30 mg total) by mouth daily.   OXYCODONE-ACETAMINOPHEN (PERCOCET) 10-325 MG PER TABLET    Take 1 tablet by mouth every 6 (six) hours as needed for pain.  Modified Medications   No medications on file  Discontinued Medications   BENZONATATE (TESSALON) 100 MG CAPSULE    Take 1 capsule (100 mg total) by mouth 3 (three) times daily as needed for cough.   PANTOPRAZOLE (PROTONIX) 40 MG TABLET    Take 1 tablet (40 mg total) by mouth daily.     Physical Exam:  Filed Vitals:   06/07/14 1016  BP: 130/80  Pulse: 87  Temp: 98.1 F (36.7 C)  TempSrc: Oral  Resp: 20  Height: 5\' 5"  (1.651 m)  Weight: 217 lb 3.2 oz (98.521 kg)  SpO2: 94%    Physical Exam  Constitutional: She is oriented to person, place, and time. She appears well-developed and well-nourished. No distress.  Cardiovascular: Normal rate, regular rhythm and normal heart sounds.   Pulmonary/Chest: Effort normal and breath sounds normal.  Abdominal: Soft. Bowel sounds are normal.  Neurological: She is alert and oriented to person, place, and time.  Skin: Skin is warm and dry. She is not diaphoretic.  Psychiatric: She has a normal mood and affect.    Labs reviewed: Basic  Metabolic Panel:  Recent Labs  44/04/210/31/15 1153  NA 142  K 4.8  CL 100  CO2 25  GLUCOSE 117*  BUN 9  CREATININE 0.79  CALCIUM 9.9   Liver Function Tests:  Recent Labs  04/19/14 1153  AST 18  ALT 11  ALKPHOS 83  BILITOT 0.4  PROT 7.0   No results for input(s): LIPASE, AMYLASE in the last 8760 hours. No results for input(s): AMMONIA in the last 8760 hours. CBC:  Recent Labs  04/19/14 1153  WBC 3.9  NEUTROABS 2.2  HGB 13.0  HCT 37.1  MCV 113*  PLT 243   Lipid Panel: No results for input(s): CHOL, HDL, LDLCALC, TRIG, CHOLHDL, LDLDIRECT in the last 8760 hours. TSH: No results for input(s): TSH in the last 8760 hours. A1C: No results found for: HGBA1C   Assessment/Plan 1. Anxiety -has improved. Having less anxiety attacks, sleeping better at night and pain has improved on cymbalta. -will cont cymbalta 30 mg daily -if pt starts experience increased anxiety, anxiety attacks will consider increasing to cymbalta 30 mg daily, however at this time will cont 30 mg due to improvement in symptoms at current dose.   2. OA Overall with some improvement with cymbalta. Reports she is able to move better in the mornings  3. Insomnia Improved with current regimen

## 2014-06-07 NOTE — Patient Instructions (Signed)
Cont medications as prescribed  Follow up in December for routine physical will get EKG at that visit.

## 2014-07-04 ENCOUNTER — Telehealth: Payer: Self-pay

## 2014-07-04 MED ORDER — OXYCODONE-ACETAMINOPHEN 10-325 MG PO TABS: 1.0000 | ORAL_TABLET | Freq: Four times a day (QID) | ORAL | Status: DC | PRN

## 2014-07-04 MED ORDER — ALPRAZOLAM 0.5 MG PO TABS: 0.5000 mg | ORAL_TABLET | Freq: Every day | ORAL | Status: DC | PRN

## 2014-07-04 NOTE — Telephone Encounter (Signed)
Rx printed. Patient aware that they will be available for pick up after 12

## 2014-08-03 ENCOUNTER — Telehealth: Payer: Self-pay

## 2014-08-03 MED ORDER — ALPRAZOLAM 0.5 MG PO TABS: 0.5000 mg | ORAL_TABLET | Freq: Every day | ORAL | Status: DC | PRN

## 2014-08-03 MED ORDER — OXYCODONE-ACETAMINOPHEN 10-325 MG PO TABS: 1.0000 | ORAL_TABLET | Freq: Four times a day (QID) | ORAL | Status: DC | PRN

## 2014-08-03 NOTE — Telephone Encounter (Signed)
Patient left message on voice mail that needs refill on Oxycodone and Alprazolam (last refill 07/04/14), going out of town til 31st. Rx printed and given to doctor to sign.  Called patient.

## 2014-08-09 ENCOUNTER — Telehealth: Payer: Self-pay | Admitting: *Deleted

## 2014-08-09 NOTE — Telephone Encounter (Signed)
Patient called and wants an antibiotic called in due to congestion and cold Sx. Offered patient an appointment for tomorrow due to patient calling at 3:30. Patient stated that they were not in [place] and could not come in and wants an antibiotic. Instructed patient to go to nearby Urgent care if they could not come in for an appointment. She agreed.

## 2014-08-30 ENCOUNTER — Other Ambulatory Visit: Payer: Self-pay | Admitting: *Deleted

## 2014-08-30 DIAGNOSIS — R05 Cough: Secondary | ICD-10-CM

## 2014-08-30 DIAGNOSIS — R059 Cough, unspecified: Secondary | ICD-10-CM

## 2014-08-30 MED ORDER — ALPRAZOLAM 0.5 MG PO TABS: 0.5000 mg | ORAL_TABLET | Freq: Every day | ORAL | Status: DC | PRN

## 2014-08-30 MED ORDER — ALBUTEROL SULFATE HFA 108 (90 BASE) MCG/ACT IN AERS: 2.0000 | INHALATION_SPRAY | Freq: Four times a day (QID) | RESPIRATORY_TRACT | Status: DC | PRN

## 2014-08-30 NOTE — Telephone Encounter (Signed)
[facility] 

## 2014-09-03 ENCOUNTER — Encounter: Payer: Self-pay | Admitting: Nurse Practitioner

## 2014-09-03 ENCOUNTER — Ambulatory Visit (INDEPENDENT_AMBULATORY_CARE_PROVIDER_SITE_OTHER): Payer: Medicare Other | Admitting: Nurse Practitioner

## 2014-09-03 ENCOUNTER — Other Ambulatory Visit: Payer: Self-pay | Admitting: Internal Medicine

## 2014-09-03 VITALS — BP 128/80 | HR 92 | Temp 97.5°F | Ht 65.0 in | Wt 210.0 lb

## 2014-09-03 DIAGNOSIS — J209 Acute bronchitis, unspecified: Secondary | ICD-10-CM

## 2014-09-03 DIAGNOSIS — R030 Elevated blood-pressure reading, without diagnosis of hypertension: Secondary | ICD-10-CM | POA: Diagnosis not present

## 2014-09-03 DIAGNOSIS — IMO0001 Reserved for inherently not codable concepts without codable children: Secondary | ICD-10-CM

## 2014-09-03 MED ORDER — OXYCODONE-ACETAMINOPHEN 10-325 MG PO TABS: 1.0000 | ORAL_TABLET | Freq: Four times a day (QID) | ORAL | Status: DC | PRN

## 2014-09-03 NOTE — Addendum Note (Signed)
Addended by: Maurice SmallBEATTY, Tivon Lemoine C on: 09/03/2014 03:26 PM   Modules accepted: Orders

## 2014-09-03 NOTE — Progress Notes (Signed)
Patient ID: Laura KiefKathy [name], female   DOB: 01-Nov-1960, 54 y.o.   MRN: [id number]    PCP: Laura [name]  Allergies  Allergen Reactions  . Aspirin Hives  . Penicillins Hives    Chief Complaint  Patient presents with  . Follow-up    Follow-up on medication management- refill xanax and oxycodone . Patient was seen in [facility] last month, ? flu or bronchitis      HPI: Patient is a 54 y.o. female seen in the office today to follow up ED visit lat month due to increased shortness of breath. Gave her inhaler, antibiotics, fluids, steroids. She was out of town when that happened.   Completes course. Feels better.  Concerned over blood pressure from the ED visit it was "alarming"  Smoking half a pack a day- has cut back from previously but not cut back any more recently   Review of Systems:  Review of Systems  Constitutional: Negative for fever, chills, activity change, appetite change and fatigue.  HENT: Negative for congestion, postnasal drip and sinus pressure.   Respiratory: Negative for chest tightness, shortness of breath and wheezing.   Cardiovascular: Negative for chest pain.  Gastrointestinal: Negative for diarrhea and constipation.    Past Medical History  Diagnosis Date  . Chronic back pain   . Other specified diseases of blood and blood-forming organs(289.89)   . Tobacco use disorder   . Insomnia, unspecified   . Acute bronchitis   . Other malaise and fatigue   . Cough   . Complication of anesthesia     "didn't wake up"   Past Surgical History  Procedure Laterality Date  . Fracture surgery    . Tonsillectomy    . Esophagogastroduodenoscopy N/A 03/17/2013    Procedure: ESOPHAGOGASTRODUODENOSCOPY (EGD);  Surgeon: Meryl DareMalcolm T Stark, MD;  Location: [facility] ENDOSCOPY;  Service: Endoscopy;  Laterality: N/A;   Social History:   reports that she has been smoking Cigarettes.  She has been smoking about 0.50 packs per day. She does not have any smokeless  tobacco history on file. She reports that she drinks about 2.4 oz of alcohol per week. She reports that she does not use illicit drugs.  Family History  Problem Relation Age of Onset  . Arthritis Mother   . Hypertension Mother   . Hypertension Sister   . Hypertension Brother   . Hypertension Sister   . Hypertension Sister   . Hypertension Sister     Medications: Patient's Medications  New Prescriptions   No medications on file  Previous Medications   ALBUTEROL (PROVENTIL HFA;VENTOLIN HFA) 108 (90 BASE) MCG/ACT INHALER    Inhale 2 puffs into the lungs every 6 (six) hours as needed for wheezing. For shortness of breath   ALPRAZOLAM (XANAX) 0.5 MG TABLET    Take 1 tablet (0.5 mg total) by mouth daily as needed for anxiety.   DULOXETINE (CYMBALTA) 30 MG CAPSULE    Take 1 capsule (30 mg total) by mouth daily.   OXYCODONE-ACETAMINOPHEN (PERCOCET) 10-325 MG PER TABLET    Take 1 tablet by mouth every 6 (six) hours as needed for pain.  Modified Medications   No medications on file  Discontinued Medications   No medications on file     Physical Exam:  Filed Vitals:   09/03/14 1449  BP: 128/80  Pulse: 92  Temp: 97.5 F (36.4 C)  TempSrc: Oral  Height: 5\' 5"  (1.651 m)  Weight: 210 lb (95.255 kg)  SpO2: 97%  Physical Exam  Constitutional: She is oriented to person, place, and time. She appears well-developed and well-nourished. No distress.  Neck: Normal range of motion. Neck supple.  Cardiovascular: Normal rate, regular rhythm and normal heart sounds.   Pulmonary/Chest: Effort normal and breath sounds normal.  Neurological: She is alert and oriented to person, place, and time.  Skin: Skin is warm and dry. She is not diaphoretic.  Psychiatric: She has a normal mood and affect.    Labs reviewed: Basic Metabolic Panel:  Recent Labs  78/29/5612/31/15 1153  NA 142  K 4.8  CL 100  CO2 25  GLUCOSE 117*  BUN 9  CREATININE 0.79  CALCIUM 9.9   Liver Function Tests:  Recent  Labs  04/19/14 1153  AST 18  ALT 11  ALKPHOS 83  BILITOT 0.4  PROT 7.0   No results for input(s): LIPASE, AMYLASE in the last 8760 hours. No results for input(s): AMMONIA in the last 8760 hours. CBC:  Recent Labs  04/19/14 1153  WBC 3.9  NEUTROABS 2.2  HGB 13.0  HCT 37.1  MCV 113*  PLT 243   Lipid Panel: No results for input(s): CHOL, HDL, LDLCALC, TRIG, CHOLHDL, LDLDIRECT in the last 8760 hours. TSH: No results for input(s): TSH in the last 8760 hours. A1C: No results found for: HGBA1C   Assessment/Plan 1. Acute bronchitis, unspecified organism -resolved completed steroids and antibiotic  -no needing albuterol, lungs sounds clear -encouraged smoking cessation, to set new obtainable goal.  2. Elevated blood pressure -most likely elevated in the event of shortness of breath due to acute bronchitis, improved today

## 2014-10-04 ENCOUNTER — Other Ambulatory Visit: Payer: Self-pay | Admitting: *Deleted

## 2014-10-04 MED ORDER — OXYCODONE-ACETAMINOPHEN 10-325 MG PO TABS: 1.0000 | ORAL_TABLET | Freq: Four times a day (QID) | ORAL | Status: DC | PRN

## 2014-10-04 MED ORDER — ALPRAZOLAM 0.5 MG PO TABS: ORAL_TABLET | ORAL | Status: DC

## 2014-10-04 NOTE — Telephone Encounter (Signed)
Patient requested and will pick up 

## 2014-11-02 ENCOUNTER — Other Ambulatory Visit: Payer: Self-pay | Admitting: *Deleted

## 2014-11-02 MED ORDER — OXYCODONE-ACETAMINOPHEN 10-325 MG PO TABS: 1.0000 | ORAL_TABLET | Freq: Four times a day (QID) | ORAL | Status: DC | PRN

## 2014-11-02 MED ORDER — ALPRAZOLAM 0.5 MG PO TABS: ORAL_TABLET | ORAL | Status: DC

## 2014-11-02 NOTE — Telephone Encounter (Signed)
Patient Requested and will pick up 

## 2014-11-22 ENCOUNTER — Encounter (HOSPITAL_COMMUNITY): Payer: Self-pay

## 2014-11-22 ENCOUNTER — Emergency Department (HOSPITAL_COMMUNITY)
Admission: EM | Admit: 2014-11-22 | Discharge: 2014-11-22 | Disposition: A | Discharge status: Home / Self Care | Payer: Medicare Other | Attending: Emergency Medicine | Admitting: Emergency Medicine

## 2014-11-22 DIAGNOSIS — Z862 Personal history of diseases of the blood and blood-forming organs and certain disorders involving the immune mechanism: Secondary | ICD-10-CM | POA: Insufficient documentation

## 2014-11-22 DIAGNOSIS — Z8669 Personal history of other diseases of the nervous system and sense organs: Secondary | ICD-10-CM | POA: Insufficient documentation

## 2014-11-22 DIAGNOSIS — Z79899 Other long term (current) drug therapy: Secondary | ICD-10-CM | POA: Diagnosis not present

## 2014-11-22 DIAGNOSIS — Z720 Tobacco use: Secondary | ICD-10-CM | POA: Diagnosis not present

## 2014-11-22 DIAGNOSIS — Z880 Allergy status to penicillin: Secondary | ICD-10-CM | POA: Insufficient documentation

## 2014-11-22 DIAGNOSIS — G8929 Other chronic pain: Secondary | ICD-10-CM | POA: Insufficient documentation

## 2014-11-22 DIAGNOSIS — Z8709 Personal history of other diseases of the respiratory system: Secondary | ICD-10-CM | POA: Insufficient documentation

## 2014-11-22 DIAGNOSIS — R202 Paresthesia of skin: Secondary | ICD-10-CM

## 2014-11-22 DIAGNOSIS — H578 Other specified disorders of eye and adnexa: Secondary | ICD-10-CM | POA: Diagnosis present

## 2014-11-22 LAB — BASIC METABOLIC PANEL
Anion gap: 14 (ref 5–15)
BUN: 12 mg/dL (ref 6–20)
CHLORIDE: 100 mmol/L — AB (ref 101–111)
CO2: 23 mmol/L (ref 22–32)
Calcium: 10.2 mg/dL (ref 8.9–10.3)
Creatinine, Ser: 0.69 mg/dL (ref 0.44–1.00)
GFR calc non Af Amer: >60 mL/min (ref >60)
Glucose, Bld: 84 mg/dL (ref 65–99)
POTASSIUM: 4.1 mmol/L (ref 3.5–5.1)
SODIUM: 137 mmol/L (ref 135–145)

## 2014-11-22 LAB — CBC WITH DIFFERENTIAL/PLATELET
BASOS ABS: 0 10*3/uL (ref 0.0–0.1)
BASOS PCT: 1 % (ref 0–1)
EOS ABS: 0.1 10*3/uL (ref 0.0–0.7)
Eosinophils Relative: 2 % (ref 0–5)
HCT: 41.9 % (ref 36.0–46.0)
HEMOGLOBIN: 14.4 g/dL (ref 12.0–15.0)
LYMPHS ABS: 1.2 10*3/uL (ref 0.7–4.0)
Lymphocytes Relative: 32 % (ref 12–46)
MCH: 38 pg — ABNORMAL HIGH (ref 26.0–34.0)
MCHC: 34.4 g/dL (ref 30.0–36.0)
MCV: 110.6 fL — ABNORMAL HIGH (ref 78.0–100.0)
MONO ABS: 0.3 10*3/uL (ref 0.1–1.0)
MONOS PCT: 9 % (ref 3–12)
NEUTROS ABS: 2 10*3/uL (ref 1.7–7.7)
NEUTROS PCT: 56 % (ref 43–77)
PLATELETS: 142 10*3/uL — AB (ref 150–400)
RBC: 3.79 MIL/uL — ABNORMAL LOW (ref 3.87–5.11)
RDW: 14.8 % (ref 11.5–15.5)
WBC: 3.6 10*3/uL — AB (ref 4.0–10.5)

## 2014-11-22 NOTE — ED Notes (Signed)
Pt presents with 2 day h/o "pins and needles" sensation that pt reports is generalized.  Pt reports feeling hot despite having fans blow on her.  Pt also reports blisters noted to both eyes that she first noted this morning.  Pt denies pain to eyes, reports blurred vision -does not wear contacts.

## 2014-11-22 NOTE — ED Provider Notes (Signed)
CSN: [id number]     Arrival date & time 11/22/14  1304 History   First MD Initiated Contact with Patient 11/22/14 1449     No chief complaint on file.    (Consider location/radiation/quality/duration/timing/severity/associated sxs/prior Treatment) HPI Comments: Patient presents today with a chief complaint of pins and needles all over her body.  She states that symptoms have been present for the past 4 days and are unchanged.  She also reports that she has felt hot over the past 2-3 weeks, but no actual fever.  She also states that she noticed blisters in the medial corners of her eyes over the past couple of days.  She has had associated tearing, but no thick drainage.  No eye pain.  She states that earlier she had some blurred vision, but no vision changes at this time.  She denies chest pain, abdominal pain, fever, chills, headache, cough, urinary symptoms, nausea, vomiting, or diarrhea.  The history is provided by the patient.    Past Medical History  Diagnosis Date  . Chronic back pain   . Other specified diseases of blood and blood-forming organs(289.89)   . Tobacco use disorder   . Insomnia, unspecified   . Acute bronchitis   . Other malaise and fatigue   . Cough   . Complication of anesthesia     "didn't wake up"   Past Surgical History  Procedure Laterality Date  . Fracture surgery    . Tonsillectomy    . Esophagogastroduodenoscopy N/A 03/17/2013    Procedure: ESOPHAGOGASTRODUODENOSCOPY (EGD);  Surgeon: Meryl Dare, MD;  Location[facility] ENDOSCOPY;  Service: Endoscopy;  Laterality: N/A;   Family History  Problem Relation Age of Onset  . Arthritis Mother   . Hypertension Mother   . Hypertension Sister   . Hypertension Brother   . Hypertension Sister   . Hypertension Sister   . Hypertension Sister    History  Substance Use Topics  . Smoking status: Current Every Day Smoker -- 0.50 packs/day    Types: Cigarettes  . Smokeless tobacco: Not on file  . Alcohol Use: 2.4  oz/week    2 Cans of beer, 2 Shots of liquor per week     Comment: ocassional   OB History    No data available     Review of Systems  All other systems reviewed and are negative.     Allergies  Aspirin and Penicillins  Home Medications   Prior to Admission medications   Medication Sig Start Date End Date Taking? Authorizing Provider  albuterol (PROVENTIL HFA;VENTOLIN HFA) 108 (90 BASE) MCG/ACT inhaler Inhale 2 puffs into the lungs every 6 (six) hours as needed for wheezing. For shortness of breath 08/30/14  Yes Sharon Seller, NP  ALPRAZolam Prudy Feeler) 0.5 MG tablet Take one tablet by mouth once daily as needed for anxiety Patient taking differently: Take 0.5 mg by mouth 2 (two) times daily as needed for anxiety.  11/02/14  Yes Tiffany L Reed, DO  DULoxetine (CYMBALTA) 30 MG capsule Take 1 capsule (30 mg total) by mouth daily. 04/23/14  Yes Sharon Seller, NP  oxyCODONE-acetaminophen (PERCOCET) 10-325 MG per tablet Take 1 tablet by mouth every 6 (six) hours as needed for pain. 11/02/14  Yes Tiffany L Reed, DO   BP 136/71 mmHg  Pulse 83  Temp(Src) 99.1 F (37.3 C) (Oral)  Resp 16  Ht 5\' 7"  (1.702 m)  Wt 202 lb (91.627 kg)  BMI 31.63 kg/m2  SpO2 100% Physical Exam  Constitutional: She appears well-developed and well-nourished.  HENT:  Head: Normocephalic and atraumatic.  Mouth/Throat: Oropharynx is clear and moist.  Eyes: Conjunctivae, EOM and lids are normal. Pupils are equal, round, and reactive to light. Lids are everted and swept, no foreign bodies found. Right eye exhibits no discharge and no exudate. No foreign body present in the right eye. Left eye exhibits no discharge and no exudate. No foreign body present in the left eye.  No blister or lesion of the eyes visualized aside from pinguecula bilaterally  Neck: Normal range of motion. Neck supple.  Cardiovascular: Normal rate, regular rhythm and normal heart sounds.   Pulmonary/Chest: Effort normal and breath sounds  normal.  Abdominal: Soft. Bowel sounds are normal. There is no tenderness.  Musculoskeletal: Normal range of motion.  Neurological: She is alert. She has normal strength. No cranial nerve deficit or sensory deficit. Coordination and gait normal.  Skin: Skin is warm and dry.  Psychiatric: She has a normal mood and affect.  Nursing note and vitals reviewed.   ED Course  Procedures (including critical care time) Labs Review Labs Reviewed  CBC WITH DIFFERENTIAL/PLATELET - Abnormal; Notable for the following:    WBC 3.6 (*)    RBC 3.79 (*)    MCV 110.6 (*)    MCH 38.0 (*)    Platelets 142 (*)    All other components within normal limits  BASIC METABOLIC PANEL    Imaging Review No results found.   EKG Interpretation None      MDM   Final diagnoses:  None   Patient presents today with complaints of feeling "pins and needles" everywhere on her body and also feeling warm.  She is afebrile.  Normal neurological exam.  Labs normal including glucose.  Feel that the patient is stable for discharge.  Return precautions given.     Santiago Glad, PA-C 11/23/14 1543  Pricilla Loveless, MD 11/23/14 [phone]

## 2014-11-27 ENCOUNTER — Ambulatory Visit: Payer: Medicare Other | Admitting: Nurse Practitioner

## 2014-12-03 ENCOUNTER — Other Ambulatory Visit: Payer: Self-pay | Admitting: *Deleted

## 2014-12-03 MED ORDER — OXYCODONE-ACETAMINOPHEN 10-325 MG PO TABS: 1.0000 | ORAL_TABLET | Freq: Four times a day (QID) | ORAL | Status: DC | PRN

## 2014-12-03 MED ORDER — ALPRAZOLAM 0.5 MG PO TABS: ORAL_TABLET | ORAL | Status: DC

## 2014-12-03 NOTE — Telephone Encounter (Signed)
Patient requested and will pick up 

## 2015-01-03 ENCOUNTER — Other Ambulatory Visit: Payer: Self-pay | Admitting: *Deleted

## 2015-01-03 ENCOUNTER — Other Ambulatory Visit: Payer: Medicare Other

## 2015-01-03 DIAGNOSIS — M170 Bilateral primary osteoarthritis of knee: Secondary | ICD-10-CM

## 2015-01-03 DIAGNOSIS — F411 Generalized anxiety disorder: Secondary | ICD-10-CM

## 2015-01-03 MED ORDER — ALPRAZOLAM 0.5 MG PO TABS: ORAL_TABLET | ORAL | Status: DC

## 2015-01-03 MED ORDER — OXYCODONE-ACETAMINOPHEN 10-325 MG PO TABS: 1.0000 | ORAL_TABLET | Freq: Four times a day (QID) | ORAL | Status: DC | PRN

## 2015-01-03 NOTE — Telephone Encounter (Signed)
Patient requested and will pick up 

## 2015-01-04 LAB — DRUG SCREEN, URINE
Amphetamines, Urine: NEGATIVE ng/mL
Barbiturate screen, urine: NEGATIVE ng/mL
Benzodiazepine Quant, Ur: NEGATIVE ng/mL
Cannabinoid Quant, Ur: POSITIVE ng/mL
Cocaine (Metab.): NEGATIVE ng/mL
OPIATE QUANT UR: POSITIVE ng/mL
PCP Quant, Ur: NEGATIVE ng/mL

## 2015-01-04 LAB — OXYCODONE/OXYMORPHONE, URINE: OXYCODONE+OXYMORPHONE UR QL SCN: POSITIVE ng/mL

## 2015-01-21 ENCOUNTER — Other Ambulatory Visit: Payer: Self-pay

## 2015-02-01 ENCOUNTER — Other Ambulatory Visit: Payer: Self-pay | Admitting: *Deleted

## 2015-02-01 MED ORDER — ALPRAZOLAM 0.5 MG PO TABS: ORAL_TABLET | ORAL | Status: DC

## 2015-02-01 MED ORDER — OXYCODONE-ACETAMINOPHEN 10-325 MG PO TABS: 1.0000 | ORAL_TABLET | Freq: Four times a day (QID) | ORAL | Status: DC | PRN

## 2015-02-01 NOTE — Telephone Encounter (Signed)
Patient requested Rx's and will pick up. Patient also notified of Drug Screen results. She stated that she received that results in the mail. I read them to her and she stated that she was trying to get clean. I explained that we could drug test at any time and if she came back positive we would no longer prescribe her Rx's. She agreed.

## 2015-02-22 ENCOUNTER — Ambulatory Visit (INDEPENDENT_AMBULATORY_CARE_PROVIDER_SITE_OTHER): Payer: Medicare Other | Admitting: Internal Medicine

## 2015-02-22 ENCOUNTER — Encounter: Payer: Self-pay | Admitting: Internal Medicine

## 2015-02-22 VITALS — BP 118/72 | HR 95 | Temp 97.9°F | Resp 20 | Ht 67.0 in | Wt 205.0 lb

## 2015-02-22 DIAGNOSIS — Z23 Encounter for immunization: Secondary | ICD-10-CM | POA: Diagnosis not present

## 2015-02-22 DIAGNOSIS — R61 Generalized hyperhidrosis: Secondary | ICD-10-CM | POA: Diagnosis not present

## 2015-02-22 DIAGNOSIS — J208 Acute bronchitis due to other specified organisms: Secondary | ICD-10-CM

## 2015-02-22 DIAGNOSIS — B372 Candidiasis of skin and nail: Secondary | ICD-10-CM | POA: Diagnosis not present

## 2015-02-22 MED ORDER — AZITHROMYCIN 250 MG PO TABS: ORAL_TABLET | ORAL | Status: DC

## 2015-02-22 MED ORDER — FLUCONAZOLE 150 MG PO TABS: 150.0000 mg | ORAL_TABLET | Freq: Once | ORAL | Status: DC

## 2015-02-22 NOTE — Patient Instructions (Signed)
Drink plenty of water to thin your mucus. Also eat yogurt (if you like it) while taking the antibiotic (zpak) that I prescribed.  This will help prevent worsening of your yeast infection. Get enough rest. You may also take otc robitussin DM for your coughing or mucinex DM capsules for the thick mucus. Take the yeast medicine after you complete the zpak.  Call if your symptoms are not improving after you complete these treatment.

## 2015-02-22 NOTE — Progress Notes (Signed)
Patient ID: [name], female   DOB: 1961-04-01, 54 y.o.   MRN: [id number]   Location: [facility] Provider: Gwenith Spitz. Renato Gails, D.O., C.M.D.  Code Status: full code Goals of Care: Advanced Directive information Does patient have an advance directive?: No, Would patient like information on creating an advanced directive?: No - patient declined information  Chief Complaint  Patient presents with  . Acute Visit    Patients c/o May have yeast infection , and has cough and green mucous coming out of nose and yellow mucous from mouth started last thursday  . Immunizations    Will take flu and prevnar 13     HPI: Patient is a 54 y.o. female seen in the office today for cough productive of yellow mucus in the past few days and a yeast infection which has been going on for a while.  She'd been having nightsweats.  She bought otc medication and nightsweats almost quit, but have not cleared up the infection.  Sore body, bruises she could not explain, felt like pins and needles poking in her, nauseous, several days of diarrhea.    She couldn't sleep or eat.  Also having pain in lower abdomen and hurting in the lower abdomen.    Refuses HIV testing.  Says she does not have sexual intercourse so she is not at risk.  She is but does not accept it.    Review of Systems:  Review of Systems  Constitutional: Negative for fever.       Nightsweats  HENT: Positive for congestion. Negative for ear pain, hearing loss and sore throat.        Ears itchy; green mucus  Eyes: Negative for blurred vision.  Respiratory: Positive for cough and sputum production. Negative for shortness of breath.        Yellow  Cardiovascular: Negative for chest pain.  Gastrointestinal: Negative for abdominal pain.  Genitourinary: Positive for dysuria. Negative for urgency and frequency.  Musculoskeletal: Negative for falls.  Neurological: Negative for dizziness.  Psychiatric/Behavioral: Positive for depression. Negative  for memory loss. The patient is nervous/anxious.     Past Medical History  Diagnosis Date  . Chronic back pain   . Other specified diseases of blood and blood-forming organs(289.89)   . Tobacco use disorder   . Insomnia, unspecified   . Acute bronchitis   . Other malaise and fatigue   . Cough   . Complication of anesthesia     "didn't wake up"    Past Surgical History  Procedure Laterality Date  . Fracture surgery    . Tonsillectomy    . Esophagogastroduodenoscopy N/A 03/17/2013    Procedure: ESOPHAGOGASTRODUODENOSCOPY (EGD);  Surgeon: Meryl Dare, MD;  Location: [facility] ENDOSCOPY;  Service: Endoscopy;  Laterality: N/A;    Allergies  Allergen Reactions  . Aspirin Hives  . Penicillins Hives      Medication List       This list is accurate as of: 02/22/15 10:20 AM.  Always use your most recent med list.               albuterol 108 (90 BASE) MCG/ACT inhaler  Commonly known as:  PROVENTIL HFA;VENTOLIN HFA  Inhale 2 puffs into the lungs every 6 (six) hours as needed for wheezing. For shortness of breath     ALPRAZolam 0.5 MG tablet  Commonly known as:  XANAX  Take one tablet by mouth once daily as needed for anxiety     DULoxetine 30 MG capsule  Commonly known as:  CYMBALTA  Take 1 capsule (30 mg total) by mouth daily.     oxyCODONE-acetaminophen 10-325 MG tablet  Commonly known as:  PERCOCET  Take 1 tablet by mouth every 6 (six) hours as needed for pain.        Health Maintenance  Topic Date Due  . Hepatitis C Screening  [birth year]  . HIV Screening  09/08/1977  . TETANUS/TDAP  09/08/1981  . INFLUENZA VACCINE  11/19/2014  . MAMMOGRAM  05/20/2016 (Originally 09/08/2012)  . PAP SMEAR  05/20/2016 (Originally 09/09/1983)  . COLONOSCOPY  05/20/2020 (Originally 09/08/2012)    Physical Exam: Filed Vitals:   02/22/15 0956  BP: 118/72  Pulse: 95  Temp: 97.9 F (36.6 C)  TempSrc: Oral  Resp: 20  Height: 5\' 7"  (1.702 m)  Weight: 205 lb (92.987 kg)  SpO2:  96%   Body mass index is 32.1 kg/(m^2). Physical Exam  Constitutional: She is oriented to person, place, and time. She appears well-developed and well-nourished. No distress.  HENT:  Nasal congestion with green mucus  Neck: Neck supple.  Cardiovascular: Normal rate, regular rhythm, normal heart sounds and intact distal pulses.   Pulmonary/Chest: Effort normal. No respiratory distress.  Coarse rhonchi  Abdominal: Bowel sounds are normal.  Genitourinary: No vaginal discharge found.  Refused examination  Musculoskeletal: Normal range of motion.  Walks with cane  Lymphadenopathy:    She has no cervical adenopathy.  Neurological: She is alert and oriented to person, place, and time.  Skin: Skin is warm and dry.  Psychiatric: She has a normal mood and affect.    Labs reviewed: Basic Metabolic Panel:  Recent Labs  81/19/1412/31/15 1153 11/22/14 1511  NA 142 137  K 4.8 4.1  CL 100 100*  CO2 25 23  GLUCOSE 117* 84  BUN 9 12  CREATININE 0.79 0.69  CALCIUM 9.9 10.2   Liver Function Tests:  Recent Labs  04/19/14 1153  AST 18  ALT 11  ALKPHOS 83  BILITOT 0.4  PROT 7.0  ALBUMIN 4.3   No results for input(s): LIPASE, AMYLASE in the last 8760 hours. No results for input(s): AMMONIA in the last 8760 hours. CBC:  Recent Labs  04/19/14 1153 11/22/14 1511  WBC 3.9 3.6*  NEUTROABS 2.2 2.0  HGB 13.0 14.4  HCT 37.1 41.9  MCV 113* 110.6*  PLT 243 142*    Assessment/Plan 1. 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  Dispense: 5  tablet; Refill: 0 -she is to let us know if this is ineffective and should not take it until she finishes the zpak for her bronchitis  2. Acute bronchitis due to other specified organisms - advised to hydrate, get enough rest, use mucinex and or otc cough syrup - azithromycin (ZITHROMAX Z-PAK) 250 MG tablet; Two tabs today and one tab daily for 4 days  Dispense: 6 each; Refill: 0  3. Chronic night sweats -also unclear if this is due to hormones?   -needs further evaluation, but she refuses  this  Labs/tests ordered:  Refused hiv, hep testing; would also like for her to have pelvic exam to r/o PID, but refuses   Next appt:  03/25/2015   Dathan Attia L. Zeda Gangwer, D.O. Geriatrics [facility] [address], [place] [address] Cell Phone (Mon-Fri 8am-5pm):  [phone] On Call:  [phone] & follow prompts after 5pm & weekends Office Phone:  [phone] Office Fax:  [phone]

## 2015-03-01 ENCOUNTER — Encounter: Payer: Self-pay | Admitting: *Deleted

## 2015-03-03 DIAGNOSIS — R61 Generalized hyperhidrosis: Secondary | ICD-10-CM | POA: Insufficient documentation

## 2015-03-04 ENCOUNTER — Other Ambulatory Visit: Payer: Self-pay | Admitting: *Deleted

## 2015-03-04 MED ORDER — OXYCODONE-ACETAMINOPHEN 10-325 MG PO TABS: 1.0000 | ORAL_TABLET | Freq: Four times a day (QID) | ORAL | Status: DC | PRN

## 2015-03-04 MED ORDER — ALPRAZOLAM 0.5 MG PO TABS: ORAL_TABLET | ORAL | Status: DC

## 2015-03-04 NOTE — Telephone Encounter (Signed)
Patient requested and will pick up 

## 2015-03-04 NOTE — Telephone Encounter (Signed)
Patient requested and will pick up. Narcotic Contracted printed.

## 2015-03-25 ENCOUNTER — Ambulatory Visit (INDEPENDENT_AMBULATORY_CARE_PROVIDER_SITE_OTHER): Payer: Medicare Other | Admitting: Internal Medicine

## 2015-03-25 ENCOUNTER — Encounter: Payer: Self-pay | Admitting: Internal Medicine

## 2015-03-25 VITALS — BP 138/92 | HR 80 | Temp 97.9°F | Ht 66.0 in | Wt 209.0 lb

## 2015-03-25 DIAGNOSIS — M5441 Lumbago with sciatica, right side: Secondary | ICD-10-CM | POA: Diagnosis not present

## 2015-03-25 DIAGNOSIS — G4700 Insomnia, unspecified: Secondary | ICD-10-CM | POA: Diagnosis not present

## 2015-03-25 DIAGNOSIS — R61 Generalized hyperhidrosis: Secondary | ICD-10-CM | POA: Diagnosis not present

## 2015-03-25 DIAGNOSIS — Z720 Tobacco use: Secondary | ICD-10-CM | POA: Diagnosis not present

## 2015-03-25 DIAGNOSIS — Z0000 Encounter for general adult medical examination without abnormal findings: Secondary | ICD-10-CM

## 2015-03-25 DIAGNOSIS — F411 Generalized anxiety disorder: Secondary | ICD-10-CM | POA: Diagnosis not present

## 2015-03-25 MED ORDER — GABAPENTIN 100 MG PO CAPS: 100.0000 mg | ORAL_CAPSULE | Freq: Every day | ORAL | Status: DC

## 2015-03-25 NOTE — Progress Notes (Signed)
Patient ID: Laura [name], female   DOB: Mar 10, 1961, 54 y.o.   MRN: [id number]   Location: [facility] Provider: Rexene Edison. Mariea Clonts, D.O., C.M.D.  Code Status: full code Goals of Care: Advanced Directive information Does patient have an advance directive?: No, Would patient like information on creating an advanced directive?: No - patient declined information  Chief Complaint  Patient presents with  . Annual Exam    Comprehensive exam   . Medical Management of Chronic Issues    chronic back pain, insomia  . Leg Pain    pain running down right leg    HPI: Patient is a 54 y.o. female seen in the office today for the annual exam with med mgt of chronic diseases.    Has pain from "ass bone" down back of leg.  Takes pain medication as needed.  Taking it every 4 hours since it's been flared up.  She thought it was related to her yeast infection, but this persists.  Cannot walk for exercise.  Has been on lyrica before but had nightmares--severe and scary.  Does not recall ever taking neurontin/gabapentin.   Smoking:  Ongoing.    Anxiety is unchanged.  Uses the xanax.  No worse than usual.    Insomnia--only able to sleep for a few hrs before wakes back up.  Usually wakes up due to the pain in her bottom.    Hot flashes not as bad but still there. Not taking any hormones or other non-prescribed meds from elsewhere.  Doesn't see anyone else.   Has cologuard kit that she gets through insurance.  Needs to remember to do it.   Refuses mammograms.    Did go see an ophthalmologist.  Night vision had been bad.  No cataracts.    Review of Systems:  Review of Systems  Constitutional: Negative for fever, chills and malaise/fatigue.  HENT: Negative for congestion.   Eyes: Positive for blurred vision.       At night--getting glasses from [facility]  Respiratory: Negative for shortness of breath.   Cardiovascular: Negative for chest pain and leg swelling.  Gastrointestinal: Negative for  heartburn, abdominal pain, constipation, blood in stool and melena.  Genitourinary: Negative for dysuria, urgency and frequency.  Musculoskeletal: Positive for back pain. Negative for falls.  Skin: Negative for rash.  Neurological: Positive for tingling and sensory change. Negative for dizziness, loss of consciousness and weakness.  Psychiatric/Behavioral: Negative for depression and memory loss. The patient is nervous/anxious and has insomnia.     Past Medical History  Diagnosis Date  . Chronic back pain   . Other specified diseases of blood and blood-forming organs(289.89)   . Tobacco use disorder   . Insomnia, unspecified   . Acute bronchitis   . Other malaise and fatigue   . Cough   . Complication of anesthesia     "didn't wake up"    Past Surgical History  Procedure Laterality Date  . Fracture surgery    . Tonsillectomy    . Esophagogastroduodenoscopy N/A 03/17/2013    Procedure: ESOPHAGOGASTRODUODENOSCOPY (EGD);  Surgeon: Ladene Artist, MD;  Location: [facility] ENDOSCOPY;  Service: Endoscopy;  Laterality: N/A;    Allergies  Allergen Reactions  . Aspirin Hives  . Penicillins Hives      Medication List       This list is accurate as of: 03/25/15  2:24 PM.  Always use your most recent med list.  albuterol 108 (90 BASE) MCG/ACT inhaler  Commonly known as:  PROVENTIL HFA;VENTOLIN HFA  Inhale 2 puffs into the lungs every 6 (six) hours as needed for wheezing. For shortness of breath     ALPRAZolam 0.5 MG tablet  Commonly known as:  XANAX  Take one tablet by mouth once daily as needed for anxiety     DULoxetine 30 MG capsule  Commonly known as:  CYMBALTA  Take 1 capsule (30 mg total) by mouth daily.     oxyCODONE-acetaminophen 10-325 MG tablet  Commonly known as:  PERCOCET  Take 1 tablet by mouth every 6 (six) hours as needed for pain.        Health Maintenance  Topic Date Due  . Hepatitis C Screening  01-14-1961  . HIV Screening  09/08/1977  .  TETANUS/TDAP  09/08/1981  . MAMMOGRAM  05/20/2016 (Originally 09/08/2012)  . PAP SMEAR  05/20/2016 (Originally 09/09/1983)  . COLONOSCOPY  05/20/2020 (Originally 09/08/2012)  . INFLUENZA VACCINE  11/19/2015    Physical Exam: Filed Vitals:   03/25/15 1406  BP: 138/92  Pulse: 80  Temp: 97.9 F (36.6 C)  TempSrc: Oral  Height: 5' 6" (1.676 m)  Weight: 209 lb (94.802 kg)  SpO2: 94%   Body mass index is 33.75 kg/(m^2). Physical Exam  Constitutional: She is oriented to person, place, and time. She appears well-developed and well-nourished.  HENT:  Head: Normocephalic and atraumatic.  Right Ear: External ear normal.  Left Ear: External ear normal.  Nose: Nose normal.  Mouth/Throat: Oropharynx is clear and moist. No oropharyngeal exudate.  Eyes: Conjunctivae and EOM are normal. Pupils are equal, round, and reactive to light.  Neck: Normal range of motion. Neck supple. No JVD present.  Cardiovascular: Normal rate, regular rhythm, normal heart sounds and intact distal pulses.   Pulmonary/Chest: Effort normal and breath sounds normal. Right breast exhibits no inverted nipple, no mass, no nipple discharge, no skin change and no tenderness. Left breast exhibits no inverted nipple, no mass, no nipple discharge, no skin change and no tenderness.  Abdominal: Soft. Bowel sounds are normal. She exhibits no distension. There is no tenderness.  Musculoskeletal: Normal range of motion. She exhibits tenderness.  Right buttock and lower lumbar pain  Lymphadenopathy:    She has no cervical adenopathy.  Neurological: She is alert and oriented to person, place, and time. She has normal reflexes.  Skin: Skin is warm and dry.  Psychiatric: She has a normal mood and affect.    Labs reviewed: Basic Metabolic Panel:  Recent Labs  04/19/14 1153 11/22/14 1511  NA 142 137  K 4.8 4.1  CL 100 100*  CO2 25 23  GLUCOSE 117* 84  BUN 9 12  CREATININE 0.79 0.69  CALCIUM 9.9 10.2   Liver Function  Tests:  Recent Labs  04/19/14 1153  AST 18  ALT 11  ALKPHOS 83  BILITOT 0.4  PROT 7.0  ALBUMIN 4.3   No results for input(s): LIPASE, AMYLASE in the last 8760 hours. No results for input(s): AMMONIA in the last 8760 hours. CBC:  Recent Labs  04/19/14 1153 11/22/14 1511  WBC 3.9 3.6*  NEUTROABS 2.2 2.0  HGB 13.0 14.4  HCT 37.1 41.9  MCV 113* 110.6*  PLT 243 142*   Lipid Panel: No results for input(s): CHOL, HDL, LDLCALC, TRIG, CHOLHDL, LDLDIRECT in the last 8760 hours. No results found for: HGBA1C   Assessment/Plan 1. Annual physical exam - refused pap/pelvic -did permit breast exam which was normal,  but won't do mammogram -needs basic screening labs -doing cologuard but has not yet - CBC with Differential/Platelet; Future - Comprehensive metabolic panel; Future - Lipid panel; Future - Hemoglobin A1c; Future  2. Chronic night sweats - cause unclear and pt won't accept hepatitis or HIV testing - CBC with Differential/Platelet; Future - Comprehensive metabolic panel; Future  3. Midline low back pain with right-sided sciatica - will start on gabapentin at bedtime to manage this pain and continue percocet as needed - gabapentin (NEURONTIN) 100 MG capsule; Take 1 capsule (100 mg total) by mouth at bedtime.  Dispense: 30 capsule; Refill: 3 -may increase gabapentin if not getting full relief  4. Insomnia -ongoing problem -wakes up after a few hrs probably b/c xanax is not a good sleeping pill -will see if the gabapentin helps the sleep  5. Generalized anxiety disorder - cont xanax  6. Tobacco abuse -is not ready to quit despite counseling  Labs/tests ordered:   Orders Placed This Encounter  Procedures  . CBC with Differential/Platelet    Standing Status: Future     Number of Occurrences:      Standing Expiration Date: 05/20/2015  . Comprehensive metabolic panel    Standing Status: Future     Number of Occurrences:      Standing Expiration Date:  05/20/2015    Order Specific Question:  Has the patient fasted?    Answer:  Yes  . Lipid panel    Standing Status: Future     Number of Occurrences:      Standing Expiration Date: 05/20/2015    Order Specific Question:  Has the patient fasted?    Answer:  Yes  . Hemoglobin A1c    Standing Status: Future     Number of Occurrences:      Standing Expiration Date: 05/20/2015   Next appt:  08/07/2015 but labs in next 2 mos   Tiffany L. Reed, D.O. [facility] [address], [place] [address] Cell Phone (Mon-Fri 8am-5pm):  [phone] On Call:  [phone] & follow prompts after 5pm & weekends Office Phone:  [phone] Office Fax:  [phone]

## 2015-03-25 NOTE — Patient Instructions (Signed)
Try gabapentin for back and buttock pain and to help sleep at night  We can increase dose if seems to give some relief but not adequate.

## 2015-03-29 ENCOUNTER — Telehealth: Payer: Self-pay

## 2015-03-29 NOTE — Telephone Encounter (Signed)
Patient called stating the current dose of gabapentin is not working and needs to be increased as mentioned in office visit note. Please advise  Patient is using pharmacy on file.

## 2015-03-30 NOTE — Telephone Encounter (Signed)
Pt may increase to gabapentin 300mg  at bedtime.

## 2015-04-01 MED ORDER — GABAPENTIN 300 MG PO CAPS: 300.0000 mg | ORAL_CAPSULE | Freq: Every day | ORAL | Status: DC

## 2015-04-01 NOTE — Telephone Encounter (Signed)
RX sent to pharmacy. Called patient, number disconnected. I called patient's mother listed as alternative contact- no voicemail. I will try to reach patient again later.

## 2015-04-02 NOTE — Telephone Encounter (Signed)
Called patient, number not in service. I will try again later

## 2015-04-04 ENCOUNTER — Other Ambulatory Visit: Payer: Self-pay | Admitting: *Deleted

## 2015-04-04 MED ORDER — ALPRAZOLAM 0.5 MG PO TABS: ORAL_TABLET | ORAL | Status: DC

## 2015-04-04 MED ORDER — OXYCODONE-ACETAMINOPHEN 10-325 MG PO TABS: 1.0000 | ORAL_TABLET | Freq: Four times a day (QID) | ORAL | Status: DC | PRN

## 2015-04-04 NOTE — Telephone Encounter (Signed)
Patient requested and will pick up 

## 2015-05-03 ENCOUNTER — Other Ambulatory Visit: Payer: Self-pay | Admitting: *Deleted

## 2015-05-03 MED ORDER — OXYCODONE-ACETAMINOPHEN 10-325 MG PO TABS: 1.0000 | ORAL_TABLET | Freq: Four times a day (QID) | ORAL | Status: DC | PRN

## 2015-05-03 MED ORDER — ALPRAZOLAM 0.5 MG PO TABS: ORAL_TABLET | ORAL | Status: DC

## 2015-05-03 NOTE — Telephone Encounter (Signed)
Patient requested and will pick up 

## 2015-06-03 ENCOUNTER — Other Ambulatory Visit: Payer: Self-pay | Admitting: *Deleted

## 2015-06-03 MED ORDER — ALPRAZOLAM 0.5 MG PO TABS: ORAL_TABLET | ORAL | Status: DC

## 2015-06-03 MED ORDER — OXYCODONE-ACETAMINOPHEN 10-325 MG PO TABS: 1.0000 | ORAL_TABLET | Freq: Four times a day (QID) | ORAL | Status: DC | PRN

## 2015-06-03 NOTE — Telephone Encounter (Signed)
Patient requested and will pick up 

## 2015-06-13 ENCOUNTER — Encounter: Payer: Self-pay | Admitting: Nurse Practitioner

## 2015-06-28 ENCOUNTER — Telehealth: Payer: Self-pay

## 2015-06-28 MED ORDER — ALPRAZOLAM 0.5 MG PO TABS: ORAL_TABLET | ORAL | Status: DC

## 2015-06-28 MED ORDER — OXYCODONE-ACETAMINOPHEN 10-325 MG PO TABS: 1.0000 | ORAL_TABLET | Freq: Four times a day (QID) | ORAL | Status: DC | PRN

## 2015-06-28 NOTE — Addendum Note (Signed)
Addended by: Maurice SmallBEATTY, SHUENEAKA C on: 06/28/2015 03:46 PM   Modules accepted: Orders

## 2015-06-28 NOTE — Telephone Encounter (Signed)
Patient called wants Rx written for Oxycodone, and Xanax (last refill 06/03/15) to pick up Monday when she comes in for her lab work. She doesn't want to wait on them.

## 2015-06-28 NOTE — Addendum Note (Signed)
Addended by: Maurice SmallBEATTY, SHUENEAKA C on: 06/28/2015 03:50 PM   Modules accepted: Orders

## 2015-07-01 ENCOUNTER — Other Ambulatory Visit: Payer: Self-pay

## 2015-07-01 ENCOUNTER — Other Ambulatory Visit: Payer: Medicare Other

## 2015-07-01 DIAGNOSIS — R61 Generalized hyperhidrosis: Secondary | ICD-10-CM

## 2015-07-01 DIAGNOSIS — Z0000 Encounter for general adult medical examination without abnormal findings: Secondary | ICD-10-CM

## 2015-07-02 LAB — LIPID PANEL
Chol/HDL Ratio: 1.4 ratio units (ref 0.0–4.4)
Cholesterol, Total: 218 mg/dL — ABNORMAL HIGH (ref 100–199)
HDL: 154 mg/dL (ref >39)
LDL Calculated: 50 mg/dL (ref 0–99)
Triglycerides: 69 mg/dL (ref 0–149)
VLDL Cholesterol Cal: 14 mg/dL (ref 5–40)

## 2015-07-02 LAB — CBC WITH DIFFERENTIAL/PLATELET
Basophils Absolute: 0 10*3/uL (ref 0.0–0.2)
Basos: 1 %
EOS (ABSOLUTE): 0.1 10*3/uL (ref 0.0–0.4)
Eos: 1 %
Hematocrit: 41.9 % (ref 34.0–46.6)
Hemoglobin: 14.2 g/dL (ref 11.1–15.9)
Immature Grans (Abs): 0 10*3/uL (ref 0.0–0.1)
Immature Granulocytes: 0 %
Lymphocytes Absolute: 1.5 10*3/uL (ref 0.7–3.1)
Lymphs: 37 %
MCH: 39.6 pg — ABNORMAL HIGH (ref 26.6–33.0)
MCHC: 33.9 g/dL (ref 31.5–35.7)
MCV: 117 fL — ABNORMAL HIGH (ref 79–97)
Monocytes Absolute: 0.3 10*3/uL (ref 0.1–0.9)
Monocytes: 6 %
Neutrophils Absolute: 2.2 10*3/uL (ref 1.4–7.0)
Neutrophils: 55 %
Platelets: 183 10*3/uL (ref 150–379)
RBC: 3.59 x10E6/uL — ABNORMAL LOW (ref 3.77–5.28)
RDW: 16.8 % — ABNORMAL HIGH (ref 12.3–15.4)
WBC: 4 10*3/uL (ref 3.4–10.8)

## 2015-07-02 LAB — COMPREHENSIVE METABOLIC PANEL
ALT: 15 IU/L (ref 0–32)
AST: 27 IU/L (ref 0–40)
Albumin/Globulin Ratio: 1.7 (ref 1.2–2.2)
Albumin: 4.5 g/dL (ref 3.5–5.5)
Alkaline Phosphatase: 109 IU/L (ref 39–117)
BUN/Creatinine Ratio: 19 (ref 9–23)
BUN: 13 mg/dL (ref 6–24)
Bilirubin Total: 1.8 mg/dL — ABNORMAL HIGH (ref 0.0–1.2)
CO2: 24 mmol/L (ref 18–29)
Calcium: 9 mg/dL (ref 8.7–10.2)
Chloride: 91 mmol/L — ABNORMAL LOW (ref 96–106)
Creatinine, Ser: 0.67 mg/dL (ref 0.57–1.00)
GFR calc Af Amer: 117 mL/min/{1.73_m2} (ref >59)
GFR calc non Af Amer: 101 mL/min/{1.73_m2} (ref >59)
Globulin, Total: 2.7 g/dL (ref 1.5–4.5)
Glucose: 83 mg/dL (ref 65–99)
Potassium: 4 mmol/L (ref 3.5–5.2)
Sodium: 137 mmol/L (ref 134–144)
Total Protein: 7.2 g/dL (ref 6.0–8.5)

## 2015-07-02 LAB — HEMOGLOBIN A1C
Est. average glucose Bld gHb Est-mCnc: 91 mg/dL
Hgb A1c MFr Bld: 4.8 % (ref 4.8–5.6)

## 2015-08-01 ENCOUNTER — Other Ambulatory Visit: Payer: Self-pay | Admitting: *Deleted

## 2015-08-01 MED ORDER — ALPRAZOLAM 0.5 MG PO TABS: ORAL_TABLET | ORAL | Status: DC

## 2015-08-01 MED ORDER — OXYCODONE-ACETAMINOPHEN 10-325 MG PO TABS: 1.0000 | ORAL_TABLET | Freq: Four times a day (QID) | ORAL | Status: DC | PRN

## 2015-08-01 NOTE — Telephone Encounter (Signed)
Patient requested and will pick up 

## 2015-08-07 ENCOUNTER — Other Ambulatory Visit: Payer: Medicare Other

## 2015-08-09 ENCOUNTER — Ambulatory Visit: Payer: Medicare Other | Admitting: Internal Medicine

## 2015-08-26 ENCOUNTER — Encounter (HOSPITAL_COMMUNITY): Payer: Self-pay | Admitting: Emergency Medicine

## 2015-08-26 ENCOUNTER — Emergency Department (HOSPITAL_COMMUNITY)
Admission: EM | Admit: 2015-08-26 | Discharge: 2015-08-27 | Disposition: A | Discharge status: Home / Self Care | Payer: Medicare Other | Attending: Emergency Medicine | Admitting: Emergency Medicine

## 2015-08-26 ENCOUNTER — Emergency Department (HOSPITAL_COMMUNITY): Payer: Medicare Other

## 2015-08-26 DIAGNOSIS — R05 Cough: Secondary | ICD-10-CM | POA: Insufficient documentation

## 2015-08-26 DIAGNOSIS — Z862 Personal history of diseases of the blood and blood-forming organs and certain disorders involving the immune mechanism: Secondary | ICD-10-CM | POA: Insufficient documentation

## 2015-08-26 DIAGNOSIS — F17210 Nicotine dependence, cigarettes, uncomplicated: Secondary | ICD-10-CM | POA: Insufficient documentation

## 2015-08-26 DIAGNOSIS — Z8709 Personal history of other diseases of the respiratory system: Secondary | ICD-10-CM | POA: Diagnosis not present

## 2015-08-26 DIAGNOSIS — Z79899 Other long term (current) drug therapy: Secondary | ICD-10-CM | POA: Diagnosis not present

## 2015-08-26 DIAGNOSIS — R0602 Shortness of breath: Secondary | ICD-10-CM | POA: Diagnosis not present

## 2015-08-26 DIAGNOSIS — N390 Urinary tract infection, site not specified: Secondary | ICD-10-CM | POA: Diagnosis not present

## 2015-08-26 DIAGNOSIS — R197 Diarrhea, unspecified: Secondary | ICD-10-CM | POA: Diagnosis not present

## 2015-08-26 DIAGNOSIS — R5383 Other fatigue: Secondary | ICD-10-CM

## 2015-08-26 DIAGNOSIS — Z880 Allergy status to penicillin: Secondary | ICD-10-CM | POA: Insufficient documentation

## 2015-08-26 LAB — COMPREHENSIVE METABOLIC PANEL WITH GFR
ALT: 15 U/L (ref 14–54)
AST: 24 U/L (ref 15–41)
Albumin: 3.8 g/dL (ref 3.5–5.0)
Alkaline Phosphatase: 87 U/L (ref 38–126)
Anion gap: 10 (ref 5–15)
BUN: 7 mg/dL (ref 6–20)
CO2: 25 mmol/L (ref 22–32)
Calcium: 9 mg/dL (ref 8.9–10.3)
Chloride: 105 mmol/L (ref 101–111)
Creatinine, Ser: 0.68 mg/dL (ref 0.44–1.00)
GFR calc Af Amer: >60 mL/min
GFR calc non Af Amer: >60 mL/min
Glucose, Bld: 97 mg/dL (ref 65–99)
Potassium: 4.3 mmol/L (ref 3.5–5.1)
Sodium: 140 mmol/L (ref 135–145)
Total Bilirubin: 1 mg/dL (ref 0.3–1.2)
Total Protein: 7.1 g/dL (ref 6.5–8.1)

## 2015-08-26 LAB — CBC
HEMATOCRIT: 37.2 % (ref 36.0–46.0)
Hemoglobin: 12.4 g/dL (ref 12.0–15.0)
MCH: 39.7 pg — ABNORMAL HIGH (ref 26.0–34.0)
MCHC: 33.3 g/dL (ref 30.0–36.0)
MCV: 119.2 fL — ABNORMAL HIGH (ref 78.0–100.0)
PLATELETS: 189 10*3/uL (ref 150–400)
RBC: 3.12 MIL/uL — ABNORMAL LOW (ref 3.87–5.11)
RDW: 14.8 % (ref 11.5–15.5)
WBC: 5.3 10*3/uL (ref 4.0–10.5)

## 2015-08-26 LAB — URINALYSIS, ROUTINE W REFLEX MICROSCOPIC
BILIRUBIN URINE: NEGATIVE
GLUCOSE, UA: NEGATIVE mg/dL
HGB URINE DIPSTICK: NEGATIVE
KETONES UR: NEGATIVE mg/dL
Nitrite: NEGATIVE
Protein, ur: NEGATIVE mg/dL
Specific Gravity, Urine: 1.011 (ref 1.005–1.030)
pH: 6 (ref 5.0–8.0)

## 2015-08-26 LAB — URINE MICROSCOPIC-ADD ON: RBC / HPF: NONE SEEN RBC/hpf (ref 0–5)

## 2015-08-26 LAB — LIPASE, BLOOD: Lipase: 28 U/L (ref 11–51)

## 2015-08-26 IMAGING — DX DG CHEST 2V
2 series · 2 of 2 positions shown · non-contrast
Comparison: [DATE]

CLINICAL DATA: Fatigue, diarrhea, and weakness for 5 days.

EXAM:
CHEST  2 VIEW

[chest pa]
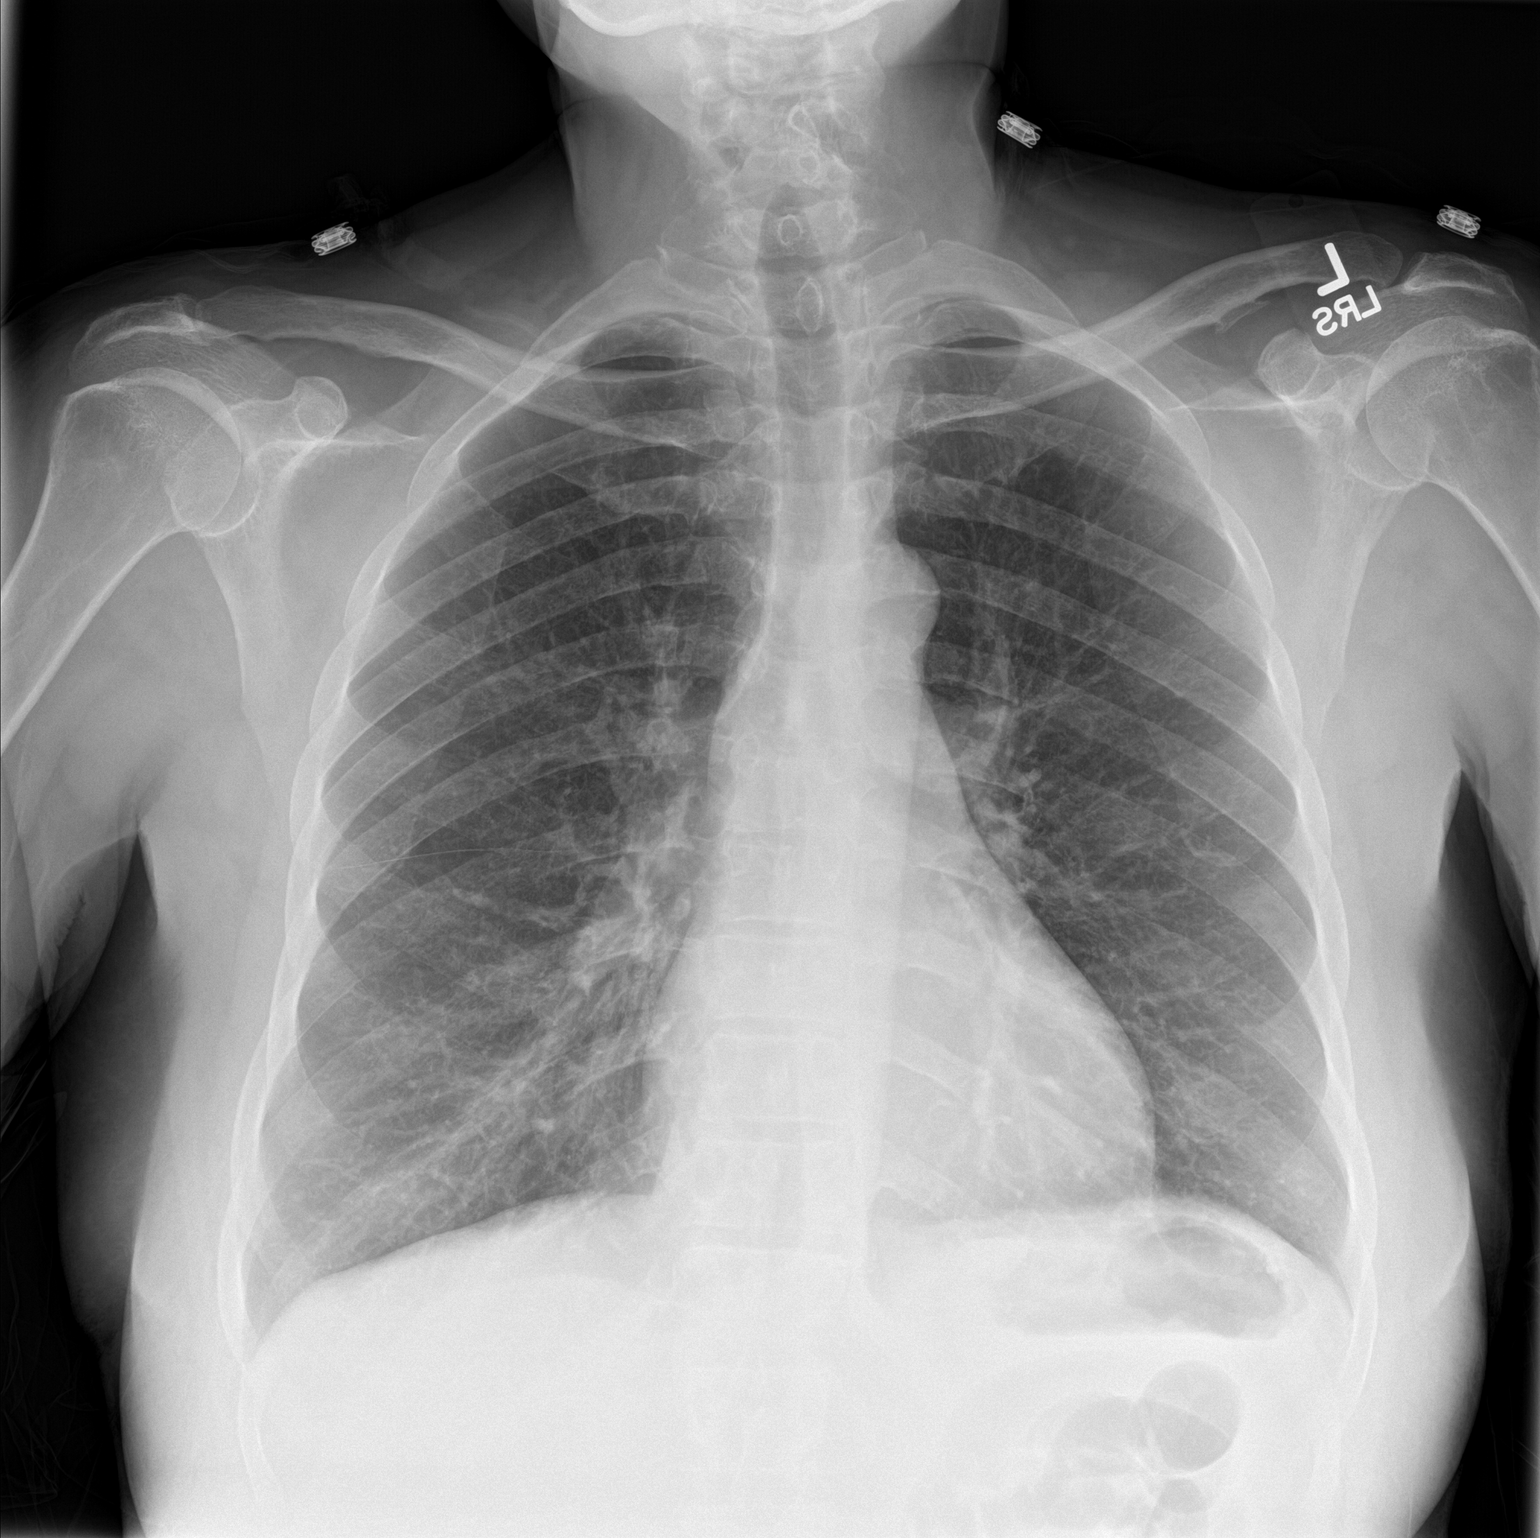

[chest lat]
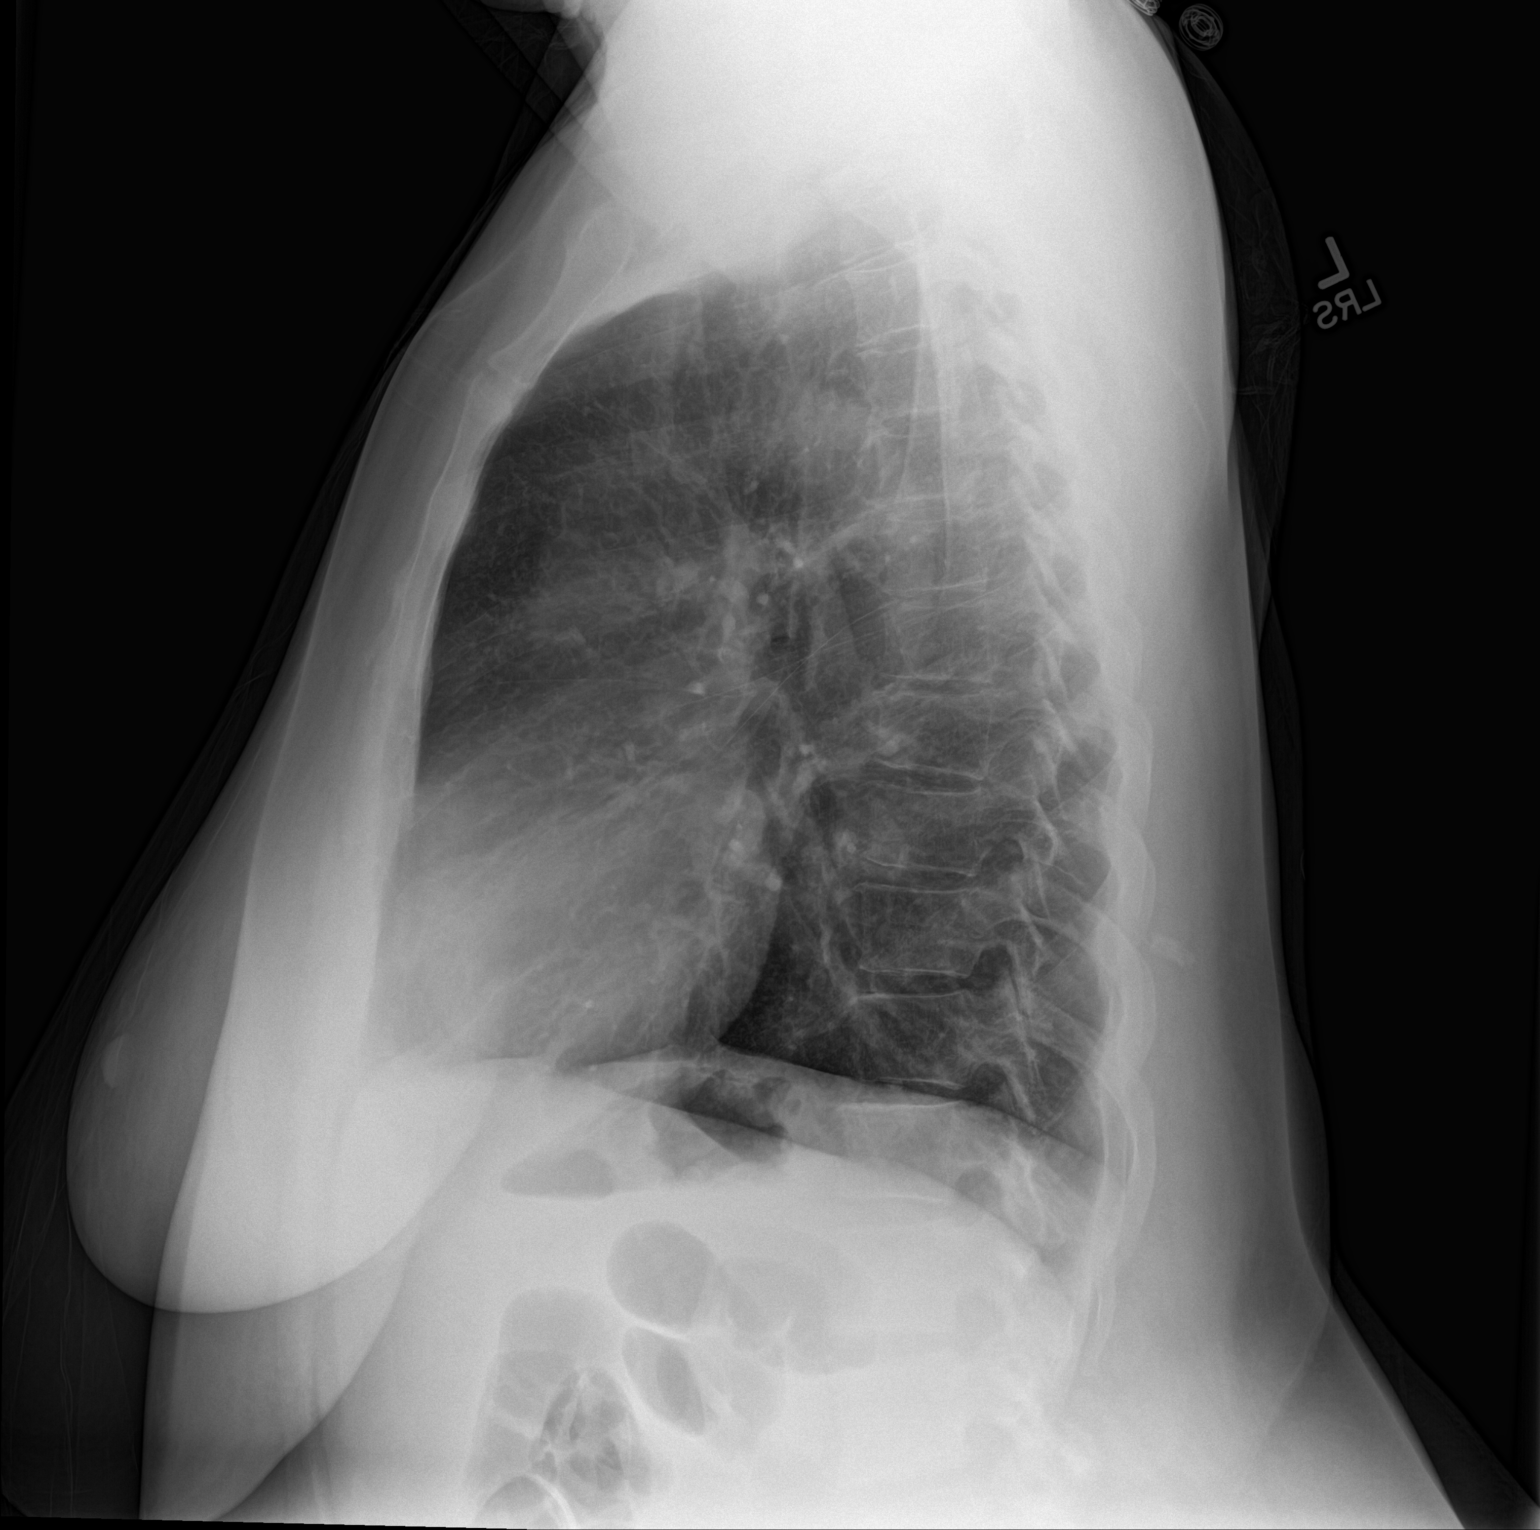

[2 of 2 positions shown; findings below may reference images not displayed]

FINDINGS: The heart size and mediastinal contours are within normal limits.
Both lungs are clear. The visualized skeletal structures are
unremarkable.
IMPRESSION: No active cardiopulmonary disease.

## 2015-08-26 NOTE — ED Provider Notes (Signed)
CSN: [id number]     Arrival date & time 08/26/15  1552 History   First MD Initiated Contact with Patient 08/26/15 2233     Chief Complaint  Patient presents with  . Diarrhea     (Consider location/radiation/quality/duration/timing/severity/associated sxs/prior Treatment) Patient is a 55 y.o. female presenting with diarrhea. The history is provided by the patient and medical records. No language interpreter was used.  Diarrhea Associated symptoms: no abdominal pain, no fever, no headaches and no vomiting    Laura KiefKathy [name] is a 55 y.o. female  who presents to the Emergency Department complaining of not sleeping well and feeling tired. She states this is a chronic issue, but worsening over the last week. Associated symptoms include 5-6 days of nonbloody diarrhea which she believes is now resolving. Worried she may be dehydrated. Admits to urinary frequency and "discomfort" when urinating, but denies dysuria. Admits to mild dry cough. She denies shortness of breath, but states that when she walks for a while she will get short of breath which has been occurring for the last 2-3 months. Denies fever, abdominal pain, n/v, chest pain. Denies hx of chronic lung disease - does use albuterol PRN wheezing/sob - + Smoker. Denies cardiac history.   Past Medical History  Diagnosis Date  . Chronic back pain   . Other specified diseases of blood and blood-forming organs(289.89)   . Tobacco use disorder   . Insomnia, unspecified   . Acute bronchitis   . Other malaise and fatigue   . Cough   . Complication of anesthesia     "didn't wake up"   Past Surgical History  Procedure Laterality Date  . Fracture surgery    . Tonsillectomy    . Esophagogastroduodenoscopy N/A 03/17/2013    Procedure: ESOPHAGOGASTRODUODENOSCOPY (EGD);  Surgeon: Meryl DareMalcolm T Stark, MD;  Location: [facility] ENDOSCOPY;  Service: Endoscopy;  Laterality: N/A;   Family History  Problem Relation Age of Onset  . Arthritis Mother   . Hypertension  Mother   . Hypertension Sister   . Hypertension Brother   . Hypertension Sister   . Hypertension Sister   . Hypertension Sister    Social History  Substance Use Topics  . Smoking status: Current Every Day Smoker -- 0.50 packs/day    Types: Cigarettes  . Smokeless tobacco: Never Used  . Alcohol Use: 2.4 oz/week    2 Cans of beer, 2 Shots of liquor per week     Comment: ocassional   OB History    No data available     Review of Systems  Constitutional: Positive for fatigue. Negative for fever.  HENT: Negative for congestion.   Eyes: Negative for visual disturbance.  Respiratory: Positive for cough and shortness of breath. Negative for wheezing.   Cardiovascular: Negative for chest pain, palpitations and leg swelling.  Gastrointestinal: Positive for diarrhea. Negative for nausea, vomiting, abdominal pain, constipation and blood in stool.  Genitourinary: Positive for frequency. Negative for dysuria, vaginal bleeding, vaginal discharge and vaginal pain.  Musculoskeletal: Negative for back pain.  Skin: Negative for color change.  Neurological: Negative for headaches.      Allergies  Aspirin and Penicillins  Home Medications   Prior to Admission medications   Medication Sig Start Date End Date Taking? Authorizing Provider  albuterol (PROVENTIL HFA;VENTOLIN HFA) 108 (90 BASE) MCG/ACT inhaler Inhale 2 puffs into the lungs every 6 (six) hours as needed for wheezing. For shortness of breath 08/30/14  Yes Sharon SellerJessica K Eubanks, NP  ALPRAZolam Prudy Feeler(XANAX) 0.5 MG  tablet Take one tablet by mouth once daily as needed for anxiety 08/01/15  Yes Tiffany L Reed, DO  oxyCODONE-acetaminophen (PERCOCET) 10-325 MG tablet Take 1 tablet by mouth every 6 (six) hours as needed for pain. 08/01/15  Yes Tiffany L Reed, DO  Phenyleph-Doxylamine-DM-APAP (NYQUIL SEVERE COLD/FLU) 5-6.25-10-325 MG/15ML LIQD Take 15 mLs by mouth as needed (for cough).   Yes Historical Provider, MD  cephALEXin (KEFLEX) 500 MG capsule  Take 1 capsule (500 mg total) by mouth 4 (four) times daily. 08/27/15   Chase Picket Ward, PA-C  DULoxetine (CYMBALTA) 30 MG capsule Take 1 capsule (30 mg total) by mouth daily. Patient not taking: Reported on 08/27/2015 04/23/14   Sharon Seller, NP  gabapentin (NEURONTIN) 300 MG capsule Take 1 capsule (300 mg total) by mouth at bedtime. Patient not taking: Reported on 08/27/2015 04/01/15   Tiffany L Reed, DO   BP 121/86 mmHg  Pulse 79  Temp(Src) 98.6 F (37 C) (Oral)  Resp 18  SpO2 97% Physical Exam  Constitutional: She is oriented to person, place, and time. She appears well-developed and well-nourished.  Alert and in no acute distress  HENT:  Head: Normocephalic and atraumatic.  Mouth/Throat: Oropharynx is clear and moist. No oropharyngeal exudate.  Cardiovascular: Normal rate, regular rhythm, normal heart sounds and intact distal pulses.  Exam reveals no gallop and no friction rub.   No murmur heard. Pulmonary/Chest: Effort normal. No respiratory distress. She exhibits no tenderness.  97-100% O2 on RA; faint expiratory wheezing bilaterally.   Abdominal: Soft. Bowel sounds are normal. She exhibits no distension and no mass. There is no tenderness. There is no rebound and no guarding.  Musculoskeletal: She exhibits no edema.  Neurological: She is alert and oriented to person, place, and time.  Skin: Skin is warm and dry.  Nursing note and vitals reviewed.   ED Course  Procedures (including critical care time) Labs Review Labs Reviewed  CBC - Abnormal; Notable for the following:    RBC 3.12 (*)    MCV 119.2 (*)    MCH 39.7 (*)    All other components within normal limits  URINALYSIS, ROUTINE W REFLEX MICROSCOPIC (NOT AT [facility]) - Abnormal; Notable for the following:    Leukocytes, UA MODERATE (*)    All other components within normal limits  URINE MICROSCOPIC-ADD ON - Abnormal; Notable for the following:    Squamous Epithelial / LPF 0-5 (*)    Bacteria, UA FEW (*)    All other  components within normal limits  LIPASE, BLOOD  COMPREHENSIVE METABOLIC PANEL  BRAIN NATRIURETIC PEPTIDE    Imaging Review Dg Chest 2 View  08/27/2015  CLINICAL DATA:  Fatigue, diarrhea, and weakness for 5 days. EXAM: CHEST  2 VIEW COMPARISON:  03/18/2013 FINDINGS: The heart size and mediastinal contours are within normal limits. Both lungs are clear. The visualized skeletal structures are unremarkable. IMPRESSION: No active cardiopulmonary disease. Electronically Signed   By: Myles Rosenthal M.D.   On: 08/27/2015 00:01   I have personally reviewed and evaluated these images and lab results as part of my medical decision-making.   EKG Interpretation None      MDM   Final diagnoses:  Fatigue  UTI (lower urinary tract infection)   Laura [name] presents to ED for fatigue. Associated urinary frequency, "discomfort" with urination, and resolving diarrhea. No CVA tenderness. No abdominal tenderness. Afebrile, VSS.   Labs: UA with moderate leuks and 6-30 white cells; lipase, CMP, CBC, bnp reviewed and reassuring.  Imaging: CXR unremarkable.   Will discharge to home with Keflex rx for UTI. Evaluation does not show pathology that would require ongoing emergent intervention or inpatient treatment. Patient is hemodynamically stable and mentating appropriately. Discussed findings and plan with patient, who agrees with treatment plan as dictated. PCP follow up strongly encouraged and patient agrees to follow up at next available appointment. Return precautions discussed and all questions answered.     Scnetx Ward, PA-C 08/27/15 0100  Gilda Crease, MD 08/27/15 [phone]

## 2015-08-26 NOTE — ED Notes (Signed)
Pt sts diarrhea x 5 days

## 2015-08-26 NOTE — ED Notes (Signed)
Patient transported to X-ray 

## 2015-08-27 LAB — BRAIN NATRIURETIC PEPTIDE: B NATRIURETIC PEPTIDE 5: 21.9 pg/mL (ref 0.0–100.0)

## 2015-08-27 MED ORDER — CEPHALEXIN 500 MG PO CAPS: 500.0000 mg | ORAL_CAPSULE | Freq: Four times a day (QID) | ORAL | Status: DC

## 2015-08-27 NOTE — Discharge Instructions (Signed)
Stay very well hydrated with plenty of water throughout the day. Please take antibiotic until completion. Follow up with primary care physician in regard's to today's visit.   Please seek immediate care if you develop the following: Your symptoms are no better or worse in 3 days. There is severe back pain or lower abdominal pain.  You develop chills.  You have a fever.  There is nausea or vomiting.  There is continued burning or discomfort with urination.

## 2015-08-27 NOTE — ED Notes (Signed)
PA at bedside.

## 2015-08-30 ENCOUNTER — Other Ambulatory Visit: Payer: Self-pay | Admitting: *Deleted

## 2015-08-30 MED ORDER — OXYCODONE-ACETAMINOPHEN 10-325 MG PO TABS: 1.0000 | ORAL_TABLET | Freq: Four times a day (QID) | ORAL | Status: DC | PRN

## 2015-08-30 MED ORDER — ALPRAZOLAM 0.5 MG PO TABS: ORAL_TABLET | ORAL | Status: DC

## 2015-08-30 NOTE — Telephone Encounter (Signed)
Patient requested and will pick up 

## 2015-08-30 NOTE — Telephone Encounter (Signed)
Patient notified to pick up 

## 2015-09-30 ENCOUNTER — Other Ambulatory Visit: Payer: Self-pay | Admitting: *Deleted

## 2015-09-30 MED ORDER — OXYCODONE-ACETAMINOPHEN 10-325 MG PO TABS: 1.0000 | ORAL_TABLET | Freq: Four times a day (QID) | ORAL | Status: DC | PRN

## 2015-09-30 MED ORDER — ALPRAZOLAM 0.5 MG PO TABS: ORAL_TABLET | ORAL | Status: DC

## 2015-09-30 NOTE — Telephone Encounter (Signed)
Spoke with patient and advised rx ready for pick-up and it will be at the front desk.  

## 2015-09-30 NOTE — Telephone Encounter (Signed)
Patient requested and will pick up. Patient needs to schedule follow up appointment. Patient aware.

## 2015-09-30 NOTE — Addendum Note (Signed)
Addended by: Sueanne MargaritaSMITH, DESHANNON L on: 09/30/2015 11:28 AM   Modules accepted: Orders, Medications

## 2015-10-07 ENCOUNTER — Encounter: Payer: Self-pay | Admitting: Internal Medicine

## 2015-10-07 ENCOUNTER — Ambulatory Visit (INDEPENDENT_AMBULATORY_CARE_PROVIDER_SITE_OTHER): Payer: Medicare Other | Admitting: Internal Medicine

## 2015-10-07 VITALS — BP 138/70 | HR 89 | Temp 98.2°F | Ht 66.0 in | Wt 202.0 lb

## 2015-10-07 DIAGNOSIS — R059 Cough, unspecified: Secondary | ICD-10-CM

## 2015-10-07 DIAGNOSIS — R05 Cough: Secondary | ICD-10-CM | POA: Diagnosis not present

## 2015-10-07 DIAGNOSIS — M545 Low back pain, unspecified: Secondary | ICD-10-CM | POA: Insufficient documentation

## 2015-10-07 DIAGNOSIS — F411 Generalized anxiety disorder: Secondary | ICD-10-CM | POA: Diagnosis not present

## 2015-10-07 DIAGNOSIS — M5441 Lumbago with sciatica, right side: Secondary | ICD-10-CM | POA: Diagnosis not present

## 2015-10-07 DIAGNOSIS — R61 Generalized hyperhidrosis: Secondary | ICD-10-CM

## 2015-10-07 DIAGNOSIS — M170 Bilateral primary osteoarthritis of knee: Secondary | ICD-10-CM

## 2015-10-07 DIAGNOSIS — Z23 Encounter for immunization: Secondary | ICD-10-CM

## 2015-10-07 MED ORDER — ALBUTEROL SULFATE HFA 108 (90 BASE) MCG/ACT IN AERS: 2.0000 | INHALATION_SPRAY | Freq: Four times a day (QID) | RESPIRATORY_TRACT | Status: DC | PRN

## 2015-10-07 NOTE — Progress Notes (Signed)
Location:  [facility] clinic Provider:  Taneisha Fuson L. Renato Gails, D.O., C.M.D.  Code Status: full code Goals of Care:  Advanced Directives 10/07/2015  Does patient have an advance directive? No  Would patient like information on creating an advanced directive? -   Chief Complaint  Patient presents with  . Medical Management of Chronic Issues    follow-up    HPI: Patient is a 55 y.o. female seen today for medical management of chronic diseases.    Says she has no concerns.    Pain:  Percocet working fine for her back pain.  Also helps her sleep.  Cane in the car today.    C/o diarrhea when she went to the ED 08/26/15.  Treated with antibiotics b/c they diagnosed her with a UTI.  Has lost 7 lbs since her last appt.  Has no appetite.    Is still smoking--answers "every chance she gets" otherwise doesn't know what to do with her hands.    Anxiety:  Occasionally still has a panic attack.  Says they're so so.  Is sleeping a little better at night.  Doesn't know why.    Needs a refill on her inhaler.  Uses it every 4-5 days.  Had some increased congestion about three weeks ago and used it more then.    Past Medical History  Diagnosis Date  . Chronic back pain   . Other specified diseases of blood and blood-forming organs(289.89)   . Tobacco use disorder   . Insomnia, unspecified   . Acute bronchitis   . Other malaise and fatigue   . Cough   . Complication of anesthesia     "didn't wake up"    Past Surgical History  Procedure Laterality Date  . Fracture surgery    . Tonsillectomy    . Esophagogastroduodenoscopy N/A 03/17/2013    Procedure: ESOPHAGOGASTRODUODENOSCOPY (EGD);  Surgeon: Meryl Dare, MD;  Location: [facility] ENDOSCOPY;  Service: Endoscopy;  Laterality: N/A;    Allergies  Allergen Reactions  . Aspirin Hives  . Penicillins Hives      Medication List       This list is accurate as of: 10/07/15  2:00 PM.  Always use your most recent med list.               albuterol 108  (90 Base) MCG/ACT inhaler  Commonly known as:  PROVENTIL HFA;VENTOLIN HFA  Inhale 2 puffs into the lungs every 6 (six) hours as needed for wheezing. For shortness of breath     ALPRAZolam 0.5 MG tablet  Commonly known as:  XANAX  Take one tablet by mouth once daily as needed for anxiety     oxyCODONE-acetaminophen 10-325 MG tablet  Commonly known as:  PERCOCET  Take 1 tablet by mouth every 6 (six) hours as needed for pain.       Review of Systems:  Review of Systems  Constitutional: Negative for fever and chills.  HENT: Negative for hearing loss.   Eyes: Negative for blurred vision.  Respiratory: Positive for cough and wheezing. Negative for shortness of breath.   Cardiovascular: Negative for chest pain, palpitations and leg swelling.  Gastrointestinal: Negative for abdominal pain, constipation, blood in stool and melena.  Genitourinary: Negative for dysuria, urgency and frequency.  Musculoskeletal: Positive for back pain and joint pain. Negative for falls.  Skin: Negative for rash.  Neurological: Positive for tingling and sensory change. Negative for dizziness and loss of consciousness.  Psychiatric/Behavioral: Negative for memory loss.  Health Maintenance  Topic Date Due  . Hepatitis C Screening  1960/08/20  . HIV Screening  09/08/1977  . TETANUS/TDAP  09/08/1981  . MAMMOGRAM  05/20/2016 (Originally 09/08/2012)  . PAP SMEAR  05/20/2016 (Originally 09/09/1983)  . COLONOSCOPY  05/20/2020 (Originally 09/08/2012)  . INFLUENZA VACCINE  11/19/2015    Physical Exam: Filed Vitals:   10/07/15 1328  BP: 138/70  Pulse: 89  Temp: 98.2 F (36.8 C)  TempSrc: Oral  Height: 5\' 6"  (1.676 m)  Weight: 202 lb (91.627 kg)  SpO2: 93%   Body mass index is 32.62 kg/(m^2). Physical Exam  Constitutional: She is oriented to person, place, and time. She appears well-developed and well-nourished. No distress.  Cardiovascular: Normal rate, regular rhythm, normal heart sounds and intact  distal pulses.   Pulmonary/Chest: Effort normal and breath sounds normal. She has no wheezes.  Musculoskeletal: She exhibits no tenderness.  Right SI joint   Neurological: She is alert and oriented to person, place, and time.  Skin: Skin is warm and dry.  Psychiatric: She has a normal mood and affect.    Labs reviewed: Basic Metabolic Panel:  Recent Labs  14/78/2906/08/04 1511 07/01/15 0933 08/26/15 1638  NA 137 137 140  K 4.1 4.0 4.3  CL 100* 91* 105  CO2 23 24 25   GLUCOSE 84 83 97  BUN 12 13 7   CREATININE 0.69 0.67 0.68  CALCIUM 10.2 9.0 9.0   Liver Function Tests:  Recent Labs  07/01/15 0933 08/26/15 1638  AST 27 24  ALT 15 15  ALKPHOS 109 87  BILITOT 1.8* 1.0  PROT 7.2 7.1  ALBUMIN 4.5 3.8    Recent Labs  08/26/15 1638  LIPASE 28   No results for input(s): AMMONIA in the last 8760 hours. CBC:  Recent Labs  11/22/14 1511 07/01/15 0933 08/26/15 1638  WBC 3.6* 4.0 5.3  NEUTROABS 2.0 2.2  --   HGB 14.4  --  12.4  HCT 41.9 41.9 37.2  MCV 110.6* 117* 119.2*  PLT 142* 183 189   Lipid Panel:  Recent Labs  07/01/15 0933  CHOL 218*  HDL 154  LDLCALC 50  TRIG 69  CHOLHDL 1.4   Lab Results  Component Value Date   HGBA1C 4.8 07/01/2015    Assessment/Plan 1. Primary osteoarthritis of both knees -continues with chronic pain here that is worse in cool rainy weather--cont percocet as needed  2. Right-sided low back pain with right-sided sciatica -ongoing, helped with percocet as she currently takes it -ambulates with cane and has tenderness in right SI joint  3. Generalized anxiety disorder -continue xanax as decrease in panic attacks  4. Chronic night sweats -suspect due to menopausal state  5. Cough - cont albuterol prn and discussed smoking cessation, but she does not want to quit--needs to do something with her hands - albuterol (PROVENTIL HFA;VENTOLIN HFA) 108 (90 Base) MCG/ACT inhaler; Inhale 2 puffs into the lungs every 6 (six) hours as  needed for wheezing. For shortness of breath  Dispense: 3 Inhaler; Refill: 3  6. Need for tetanus booster -given today   Labs/tests ordered:  Next appt:  F/u 4 mos due to narcotic and benzo Rxs  Donaciano Range L. Deserai Cansler, D.O. Geriatrics [facility] [address] [place], [place] [address] Cell Phone (Mon-Fri 8am-5pm):  [phone] On Call:  [phone] & follow prompts after 5pm & weekends Office Phone:  [phone] Office Fax:  [phone]

## 2015-10-07 NOTE — Addendum Note (Signed)
Addended by: Sueanne MargaritaSMITH, Pankaj Haack L on: 10/07/2015 02:27 PM   Modules accepted: Orders

## 2015-10-07 NOTE — Patient Instructions (Signed)
Please do your cologuard.

## 2015-10-30 ENCOUNTER — Other Ambulatory Visit: Payer: Self-pay | Admitting: *Deleted

## 2015-10-30 MED ORDER — OXYCODONE-ACETAMINOPHEN 10-325 MG PO TABS: 1.0000 | ORAL_TABLET | Freq: Four times a day (QID) | ORAL | Status: DC | PRN

## 2015-10-30 MED ORDER — ALPRAZOLAM 0.5 MG PO TABS: ORAL_TABLET | ORAL | Status: DC

## 2015-10-30 NOTE — Telephone Encounter (Signed)
Patient requested and will pick up 

## 2015-11-29 ENCOUNTER — Other Ambulatory Visit: Payer: Self-pay | Admitting: *Deleted

## 2015-11-29 ENCOUNTER — Encounter: Payer: Self-pay | Admitting: Nurse Practitioner

## 2015-11-29 MED ORDER — ALPRAZOLAM 0.5 MG PO TABS: ORAL_TABLET | ORAL | 0 refills | Status: DC

## 2015-11-29 MED ORDER — OXYCODONE-ACETAMINOPHEN 10-325 MG PO TABS: 1.0000 | ORAL_TABLET | Freq: Four times a day (QID) | ORAL | 0 refills | Status: DC | PRN

## 2015-11-29 NOTE — Telephone Encounter (Signed)
Patient requested and will pick up 

## 2015-11-29 NOTE — Telephone Encounter (Signed)
Spoke with patient and advised rx ready for pick-up and it will be at the front desk.  

## 2015-12-31 ENCOUNTER — Telehealth: Payer: Self-pay

## 2015-12-31 ENCOUNTER — Other Ambulatory Visit: Payer: Self-pay | Admitting: *Deleted

## 2015-12-31 MED ORDER — ALPRAZOLAM 0.5 MG PO TABS: ORAL_TABLET | ORAL | 0 refills | Status: DC

## 2015-12-31 MED ORDER — OXYCODONE-ACETAMINOPHEN 10-325 MG PO TABS: 1.0000 | ORAL_TABLET | Freq: Four times a day (QID) | ORAL | 0 refills | Status: DC | PRN

## 2015-12-31 NOTE — Telephone Encounter (Signed)
Patient requested and will pick up 

## 2015-12-31 NOTE — Telephone Encounter (Signed)
I called patient to inform her that prescriptions for percocet 10-325 mg tablets and Xanax 0.5 mg tablets are ready to pick up. Prescriptions were placed in filing cabinet at front desk.

## 2016-01-30 ENCOUNTER — Other Ambulatory Visit: Payer: Self-pay

## 2016-01-30 MED ORDER — OXYCODONE-ACETAMINOPHEN 10-325 MG PO TABS: 1.0000 | ORAL_TABLET | Freq: Four times a day (QID) | ORAL | 0 refills | Status: DC | PRN

## 2016-01-30 MED ORDER — ALPRAZOLAM 0.5 MG PO TABS: ORAL_TABLET | ORAL | 0 refills | Status: DC

## 2016-01-30 NOTE — Telephone Encounter (Signed)
Spoke with patient and advised rx ready for pick-up and it will be at the front desk.  

## 2016-02-06 ENCOUNTER — Encounter: Payer: Self-pay | Admitting: Nurse Practitioner

## 2016-02-06 ENCOUNTER — Ambulatory Visit: Payer: Medicare Other | Admitting: Nurse Practitioner

## 2016-02-28 ENCOUNTER — Other Ambulatory Visit: Payer: Self-pay | Admitting: Internal Medicine

## 2016-02-28 ENCOUNTER — Other Ambulatory Visit: Payer: Self-pay | Admitting: *Deleted

## 2016-02-28 ENCOUNTER — Other Ambulatory Visit: Payer: Medicare Other

## 2016-02-28 DIAGNOSIS — G8929 Other chronic pain: Secondary | ICD-10-CM

## 2016-02-28 DIAGNOSIS — Z5181 Encounter for therapeutic drug level monitoring: Secondary | ICD-10-CM

## 2016-02-28 DIAGNOSIS — M5441 Lumbago with sciatica, right side: Principal | ICD-10-CM

## 2016-02-28 DIAGNOSIS — F411 Generalized anxiety disorder: Secondary | ICD-10-CM

## 2016-02-28 MED ORDER — ALPRAZOLAM 0.5 MG PO TABS: ORAL_TABLET | ORAL | 0 refills | Status: DC

## 2016-02-28 MED ORDER — OXYCODONE-ACETAMINOPHEN 10-325 MG PO TABS: 1.0000 | ORAL_TABLET | Freq: Four times a day (QID) | ORAL | 0 refills | Status: DC | PRN

## 2016-02-28 NOTE — Telephone Encounter (Signed)
Patient requested and will pick up 

## 2016-03-05 ENCOUNTER — Telehealth: Payer: Self-pay

## 2016-03-05 LAB — PAIN MGMT, PROFILE 6 W/CONF, U
6 Acetylmorphine: NEGATIVE ng/mL (ref <10)
Alcohol Metabolites: POSITIVE ng/mL — AB (ref <500)
Alphahydroxyalprazolam: 326 ng/mL — ABNORMAL HIGH (ref <25)
Alphahydroxymidazolam: NEGATIVE ng/mL (ref <50)
Alphahydroxytriazolam: NEGATIVE ng/mL (ref <50)
Aminoclonazepam: NEGATIVE ng/mL (ref <25)
Amphetamines: NEGATIVE ng/mL (ref <500)
Barbiturates: NEGATIVE ng/mL (ref <300)
Benzodiazepines: POSITIVE ng/mL — AB (ref <100)
Cocaine Metabolite: NEGATIVE ng/mL (ref <150)
Codeine: NEGATIVE ng/mL (ref <50)
Creatinine: 174.4 mg/dL (ref >=20.0)
Ethyl Glucuronide (ETG): 425042 ng/mL — ABNORMAL HIGH (ref <500)
Ethyl Sulfate (ETS): >40000 ng/mL — ABNORMAL HIGH (ref <100)
Hydrocodone: NEGATIVE ng/mL (ref <50)
Hydromorphone: NEGATIVE ng/mL (ref <50)
Hydroxyethylflurazepam: NEGATIVE ng/mL (ref <50)
Lorazepam: NEGATIVE ng/mL (ref <50)
Marijuana Metabolite: NEGATIVE ng/mL (ref <20)
Methadone Metabolite: NEGATIVE ng/mL (ref <100)
Morphine: NEGATIVE ng/mL (ref <50)
Nordiazepam: NEGATIVE ng/mL (ref <50)
Norhydrocodone: NEGATIVE ng/mL (ref <50)
Noroxycodone: 3522 ng/mL — ABNORMAL HIGH (ref <50)
Opiates: NEGATIVE ng/mL (ref <100)
Oxazepam: NEGATIVE ng/mL (ref <50)
Oxidant: NEGATIVE ug/mL (ref <200)
Oxycodone: 6289 ng/mL — ABNORMAL HIGH (ref <50)
Oxycodone: POSITIVE ng/mL — AB (ref <100)
Oxymorphone: 4138 ng/mL — ABNORMAL HIGH (ref <50)
Phencyclidine: NEGATIVE ng/mL (ref <25)
Please note:: 0
Temazepam: NEGATIVE ng/mL (ref <50)
pH: 7.02 (ref 4.5–9.0)

## 2016-03-05 NOTE — Telephone Encounter (Signed)
I called patient to set up an appointment at Jessica's request. Patient will need an appointment before medication refills are due.   Both phone numbers listed as patient contact are no longer working. I was unable to leave a message so a letter was mailed to patient.

## 2016-03-23 ENCOUNTER — Ambulatory Visit (INDEPENDENT_AMBULATORY_CARE_PROVIDER_SITE_OTHER): Payer: Medicare Other | Admitting: Nurse Practitioner

## 2016-03-23 ENCOUNTER — Encounter: Payer: Self-pay | Admitting: Nurse Practitioner

## 2016-03-23 VITALS — BP 142/84 | HR 89 | Temp 98.1°F | Resp 17 | Ht 66.0 in | Wt 226.0 lb

## 2016-03-23 DIAGNOSIS — F411 Generalized anxiety disorder: Secondary | ICD-10-CM

## 2016-03-23 DIAGNOSIS — Z720 Tobacco use: Secondary | ICD-10-CM | POA: Diagnosis not present

## 2016-03-23 DIAGNOSIS — M170 Bilateral primary osteoarthritis of knee: Secondary | ICD-10-CM | POA: Diagnosis not present

## 2016-03-23 DIAGNOSIS — Z23 Encounter for immunization: Secondary | ICD-10-CM | POA: Diagnosis not present

## 2016-03-23 DIAGNOSIS — Z9189 Other specified personal risk factors, not elsewhere classified: Secondary | ICD-10-CM | POA: Diagnosis not present

## 2016-03-23 NOTE — Progress Notes (Signed)
Careteam: Patient Care Team: Sharon SellerJessica K Von Quintanar, NP as PCP - General (Nurse Practitioner)  Advanced Directive information Does Patient Have a Medical Advance Directive?: No, Would patient like information on creating a medical advance directive?: No - Patient declined (Pt does not feel the need to have advanced directives at this time.)  Allergies  Allergen Reactions  . Aspirin Hives  . Penicillins Hives    Chief Complaint  Patient presents with  . Acute Visit    Pt requested follow up due to missing last appointment  . Other    Pt wants to get flu vaccine today     HPI: Patient is a 55 y.o. seen in the office today. Filling out patient information paperwork today. identifies as a female.   Insurance company does nurse visit- sent off stool for FIOB which was negative in September 2017.   Reviewed drug screen with patient, reports he went out to a party the night before he picked up his refills, does not drink daily.   Blood pressure at home is always around 130/80s  Anxiety is controlled, worse at night and takes ativan for this.   Pain controlled on current regimen, takes all that prescribed, no side effects from medications, only gets medication from this practice.    Review of Systems:  Review of Systems  Constitutional: Negative for activity change, appetite change, fatigue and unexpected weight change.  Eyes: Negative.   Respiratory: Negative for cough and shortness of breath.   Cardiovascular: Negative for chest pain, palpitations and leg swelling.  Gastrointestinal: Negative for constipation and diarrhea.  Musculoskeletal: Negative for arthralgias and myalgias.  Skin: Negative for color change and wound.  Neurological: Negative for dizziness, tremors and weakness.  Psychiatric/Behavioral: Negative for agitation, behavioral problems and confusion. The patient is nervous/anxious (stable).     Past Medical History:  Diagnosis Date  . Acute bronchitis   .  Chronic back pain   . Complication of anesthesia    "didn't wake up"  . Cough   . Insomnia, unspecified   . Other malaise and fatigue   . Other specified diseases of blood and blood-forming organs(289.89)   . Tobacco use disorder    Past Surgical History:  Procedure Laterality Date  . ESOPHAGOGASTRODUODENOSCOPY N/A 03/17/2013   Procedure: ESOPHAGOGASTRODUODENOSCOPY (EGD);  Surgeon: Meryl DareMalcolm T Stark, MD;  [facility];  Service: Endoscopy;  Laterality: N/A;  . FRACTURE SURGERY    . TONSILLECTOMY     Social History:   reports that she has been smoking Cigarettes.  She has been smoking about 0.50 packs per day. She has never used smokeless tobacco. She reports that she drinks about 2.4 oz of alcohol per week . She reports that she does not use drugs.  Family History  Problem Relation Age of Onset  . Arthritis Mother   . Hypertension Mother   . Hypertension Sister   . Hypertension Brother   . Hypertension Sister   . Hypertension Sister   . Hypertension Sister     Medications: Patient's Medications  New Prescriptions   No medications on file  Previous Medications   ALBUTEROL (PROVENTIL HFA;VENTOLIN HFA) 108 (90 BASE) MCG/ACT INHALER    Inhale 2 puffs into the lungs every 6 (six) hours as needed for wheezing. For shortness of breath   ALPRAZOLAM (XANAX) 0.5 MG TABLET    Take one tablet by mouth once daily as needed for anxiety   OXYCODONE-ACETAMINOPHEN (PERCOCET) 10-325 MG TABLET    Take 1 tablet  by mouth every 6 (six) hours as needed for pain.  Modified Medications   No medications on file  Discontinued Medications   No medications on file     Physical Exam:  Vitals:   03/23/16 1309  BP: (!) 142/84  Pulse: 89  Resp: 17  Temp: 98.1 F (36.7 C)  TempSrc: Oral  SpO2: 95%  Weight: 226 lb (102.5 kg)  Height: 5\' 6"  (1.676 m)   Body mass index is 36.48 kg/m.  Physical Exam  Constitutional: She is oriented to person, place, and time. She appears  well-developed and well-nourished. No distress.  Cardiovascular: Normal rate, regular rhythm, normal heart sounds and intact distal pulses.   Pulmonary/Chest: Effort normal and breath sounds normal. She has no wheezes.  Abdominal: Soft. Bowel sounds are normal. She exhibits no distension.  Musculoskeletal: She exhibits no edema or tenderness.  Neurological: She is alert and oriented to person, place, and time.  Skin: Skin is warm and dry.  Psychiatric: She has a normal mood and affect.    Labs reviewed: Basic Metabolic Panel:  Recent Labs  82/95/6203/13/17 0933 08/26/15 1638  NA 137 140  K 4.0 4.3  CL 91* 105  CO2 24 25  GLUCOSE 83 97  BUN 13 7  CREATININE 0.67 0.68  CALCIUM 9.0 9.0   Liver Function Tests:  Recent Labs  07/01/15 0933 08/26/15 1638  AST 27 24  ALT 15 15  ALKPHOS 109 87  BILITOT 1.8* 1.0  PROT 7.2 7.1  ALBUMIN 4.5 3.8    Recent Labs  08/26/15 1638  LIPASE 28   No results for input(s): AMMONIA in the last 8760 hours. CBC:  Recent Labs  07/01/15 0933 08/26/15 1638  WBC 4.0 5.3  NEUTROABS 2.2  --   HGB  --  12.4  HCT 41.9 37.2  MCV 117* 119.2*  PLT 183 189   Lipid Panel:  Recent Labs  07/01/15 0933  CHOL 218*  HDL 154  LDLCALC 50  TRIG 69  CHOLHDL 1.4   TSH: No results for input(s): TSH in the last 8760 hours. A1C: Lab Results  Component Value Date   HGBA1C 4.8 07/01/2015     Assessment/Plan 1. Generalized anxiety disorder Well controlled on xanax 0.5 mg qhs  2. Primary osteoarthritis of both knees Pain controlled on percocet as prescribed. Will cont current regimen.   3. Tobacco abuse Not ready to quit smoking. Cessation encouraged.   4. Need for immunization against influenza - Flu Vaccine QUAD 36+ mos PF IM (Fluarix & Fluzone Quad PF)  5. At risk for adverse drug reaction Drug screen was positive for ETOH, discussed the adverse effects of ETOH mixed with benzodiazepines and narcotics. Pt aware mixing medications and  ETOH could lead to resp depression and overdose. Advised not to mix medications with alcohol. Pt understands the risk.   Follow up in 6 months for EV with lab work day of appt, sooner if needed Shanda BumpsJessica K. Biagio BorgEubanks, AGNP  [facility] [phone](Monday-Friday 8 am - 5 pm) [phone] (after hours)

## 2016-03-27 ENCOUNTER — Other Ambulatory Visit: Payer: Self-pay | Admitting: *Deleted

## 2016-03-27 MED ORDER — ALPRAZOLAM 0.5 MG PO TABS: ORAL_TABLET | ORAL | 0 refills | Status: DC

## 2016-03-27 MED ORDER — OXYCODONE-ACETAMINOPHEN 10-325 MG PO TABS: 1.0000 | ORAL_TABLET | Freq: Four times a day (QID) | ORAL | 0 refills | Status: DC | PRN

## 2016-03-27 NOTE — Telephone Encounter (Signed)
Patient requested and will pick up 

## 2016-04-27 ENCOUNTER — Other Ambulatory Visit: Payer: Self-pay | Admitting: *Deleted

## 2016-04-27 MED ORDER — OXYCODONE-ACETAMINOPHEN 10-325 MG PO TABS: 1.0000 | ORAL_TABLET | Freq: Four times a day (QID) | ORAL | 0 refills | Status: DC | PRN

## 2016-04-27 MED ORDER — ALPRAZOLAM 0.5 MG PO TABS: ORAL_TABLET | ORAL | 0 refills | Status: DC

## 2016-04-27 NOTE — Telephone Encounter (Signed)
Patient requested and will pick up. Tried calling patient back but the # only will ring a fast busy signal. Tried 4 times.

## 2016-05-28 ENCOUNTER — Other Ambulatory Visit: Payer: Self-pay

## 2016-05-28 MED ORDER — ALPRAZOLAM 0.5 MG PO TABS
ORAL_TABLET | ORAL | 0 refills | Status: DC
Start: 2016-05-28 — End: 2016-06-25

## 2016-05-28 MED ORDER — OXYCODONE-ACETAMINOPHEN 10-325 MG PO TABS: 1.0000 | ORAL_TABLET | Freq: Four times a day (QID) | ORAL | 0 refills | Status: DC | PRN

## 2016-06-03 ENCOUNTER — Telehealth: Payer: Self-pay | Admitting: *Deleted

## 2016-06-03 NOTE — Telephone Encounter (Signed)
Patient called and stated that she has been exposed to positive flu, has low grade fever, congestion, cough, sore throat. Has tried OTC medications with no relief. Has been in the bed for 4 days. No available appointment today. Please Advise.

## 2016-06-03 NOTE — Telephone Encounter (Signed)
Patient notified and agreed.  

## 2016-06-03 NOTE — Telephone Encounter (Signed)
If has been having symptoms greater than 72 hours tamiflu would not be effective. Supportive care at this point. To increase hydration. May use Tylenol alternating with motrin OTC for aches/sore throat.  Not to exceed 3 gm of tylenol from ALL sources (tylenol in pain medication) mucinex DM by mouth twice daily with full glass of water for cough and congestion for 1 week

## 2016-06-05 ENCOUNTER — Telehealth: Payer: Self-pay

## 2016-06-05 NOTE — Telephone Encounter (Signed)
Called spoke with patient and informed her that she was due for PAP and Mammory test she stated that she has never had these things done berfore and had no intrest in having them done now.    By London SheerSally C Luster, CMA

## 2016-06-25 ENCOUNTER — Other Ambulatory Visit: Payer: Self-pay | Admitting: *Deleted

## 2016-06-25 MED ORDER — OXYCODONE-ACETAMINOPHEN 10-325 MG PO TABS: 1.0000 | ORAL_TABLET | Freq: Four times a day (QID) | ORAL | 0 refills | Status: DC | PRN

## 2016-06-25 MED ORDER — ALPRAZOLAM 0.5 MG PO TABS: ORAL_TABLET | ORAL | 0 refills | Status: DC

## 2016-06-25 NOTE — Telephone Encounter (Signed)
Patient requested and will pick up 

## 2016-07-24 ENCOUNTER — Telehealth: Payer: Self-pay

## 2016-07-24 MED ORDER — OXYCODONE-ACETAMINOPHEN 10-325 MG PO TABS: 1.0000 | ORAL_TABLET | Freq: Four times a day (QID) | ORAL | 0 refills | Status: DC | PRN

## 2016-07-24 MED ORDER — ALPRAZOLAM 0.5 MG PO TABS: ORAL_TABLET | ORAL | 0 refills | Status: DC

## 2016-07-24 NOTE — Addendum Note (Signed)
Addended by: Lodema Hong MESHELL A on: 07/24/2016 10:38 AM   Modules accepted: Orders

## 2016-07-24 NOTE — Telephone Encounter (Signed)
Patient called and requested.

## 2016-08-25 ENCOUNTER — Other Ambulatory Visit: Payer: Self-pay | Admitting: *Deleted

## 2016-08-25 MED ORDER — OXYCODONE-ACETAMINOPHEN 10-325 MG PO TABS: 1.0000 | ORAL_TABLET | Freq: Four times a day (QID) | ORAL | 0 refills | Status: DC | PRN

## 2016-08-25 MED ORDER — ALPRAZOLAM 0.5 MG PO TABS: ORAL_TABLET | ORAL | 0 refills | Status: DC

## 2016-08-25 NOTE — Telephone Encounter (Signed)
Patient requested and will pick up 

## 2016-09-21 ENCOUNTER — Ambulatory Visit (INDEPENDENT_AMBULATORY_CARE_PROVIDER_SITE_OTHER): Payer: Medicare Other

## 2016-09-21 ENCOUNTER — Other Ambulatory Visit: Payer: Self-pay | Admitting: Nurse Practitioner

## 2016-09-21 ENCOUNTER — Ambulatory Visit: Payer: Medicare Other

## 2016-09-21 ENCOUNTER — Encounter: Payer: Self-pay | Admitting: Nurse Practitioner

## 2016-09-21 ENCOUNTER — Ambulatory Visit (INDEPENDENT_AMBULATORY_CARE_PROVIDER_SITE_OTHER): Payer: Medicare Other | Admitting: Nurse Practitioner

## 2016-09-21 VITALS — BP 130/82 | HR 90 | Temp 97.6°F | Ht 66.0 in | Wt 213.0 lb

## 2016-09-21 VITALS — BP 130/82 | HR 90 | Temp 97.6°F | Resp 18 | Ht 66.0 in | Wt 213.0 lb

## 2016-09-21 DIAGNOSIS — Z23 Encounter for immunization: Secondary | ICD-10-CM

## 2016-09-21 DIAGNOSIS — Z720 Tobacco use: Secondary | ICD-10-CM | POA: Diagnosis not present

## 2016-09-21 DIAGNOSIS — Z0000 Encounter for general adult medical examination without abnormal findings: Secondary | ICD-10-CM

## 2016-09-21 DIAGNOSIS — I10 Essential (primary) hypertension: Secondary | ICD-10-CM | POA: Diagnosis not present

## 2016-09-21 LAB — CBC WITH DIFFERENTIAL/PLATELET
Basophils Absolute: 93 cells/uL (ref 0–200)
Basophils Relative: 3 %
Eosinophils Absolute: 93 cells/uL (ref 15–500)
Eosinophils Relative: 3 %
HEMATOCRIT: 35.2 % (ref 35.0–45.0)
HEMOGLOBIN: 11.9 g/dL (ref 11.7–15.5)
LYMPHS ABS: 682 {cells}/uL — AB (ref 850–3900)
Lymphocytes Relative: 22 %
MCH: 40.5 pg — ABNORMAL HIGH (ref 27.0–33.0)
MCHC: 33.8 g/dL (ref 32.0–36.0)
MCV: 119.7 fL — ABNORMAL HIGH (ref 80.0–100.0)
MONO ABS: 527 {cells}/uL (ref 200–950)
MPV: 9.9 fL (ref 7.5–12.5)
Monocytes Relative: 17 %
NEUTROS ABS: 1705 {cells}/uL (ref 1500–7800)
Neutrophils Relative %: 55 %
Platelets: 295 10*3/uL (ref 140–400)
RBC: 2.94 MIL/uL — AB (ref 3.80–5.10)
RDW: 16.9 % — ABNORMAL HIGH (ref 11.0–15.0)
WBC: 3.1 10*3/uL — AB (ref 3.8–10.8)

## 2016-09-21 MED ORDER — LOSARTAN POTASSIUM 25 MG PO TABS: 25.0000 mg | ORAL_TABLET | Freq: Every day | ORAL | 1 refills | Status: DC

## 2016-09-21 MED ORDER — ZOSTER VAC RECOMB ADJUVANTED 50 MCG/0.5ML IM SUSR: 0.5000 mL | Freq: Once | INTRAMUSCULAR | 1 refills | Status: AC

## 2016-09-21 NOTE — Patient Instructions (Signed)
Laura [name] , Thank you for taking time to come for your Medicare Wellness Visit. I appreciate your ongoing commitment to your health goals. Please review the following plan we discussed and let me know if I can assist you in the future.   Screening recommendations/referrals: Colonoscopy/Colorguard due 08/19/2019 Mammogram due.  Bone Density Due Recommended yearly ophthalmology/optometry visit for glaucoma screening and checkup Recommended yearly dental visit for hygiene and checkup  Vaccinations: Influenza vaccine due 03/23/2017 Pneumococcal vaccine up to date Tdap vaccine due 10/06/2025 Shingles vaccine. If you want the new vaccine let us know and we will put in prescription.  Advanced directives: Need copy for chart. Let us know if you change your mind and want the paperwork.  Conditions/risks identified: None  Next appointment: [name], NP 6/4 @ 10:45am  Preventive Care 40-64 Years, Female Preventive care refers to lifestyle choices and visits with your health care provider that can promote health and wellness. What does preventive care include?  A yearly physical exam. This is also called an annual well check.  Dental exams once or twice a year.  Routine eye exams. Ask your health care provider how often you should have your eyes checked.  Personal lifestyle choices, including:  Daily care of your teeth and gums.  Regular physical activity.  Eating a healthy diet.  Avoiding tobacco and drug use.  Limiting alcohol use.  Practicing safe sex.  Taking low-dose aspirin daily starting at age 3.  Taking vitamin and mineral supplements as recommended by your health care provider. What happens during an annual well check? The services and screenings done by your health care provider during your annual well check will depend on your age, overall health, lifestyle risk factors, and family history of disease. Counseling  Your health care provider may ask you questions  about your:  Alcohol use.  Tobacco use.  Drug use.  Emotional well-being.  Home and relationship well-being.  Sexual activity.  Eating habits.  Work and work Statistician.  Method of birth control.  Menstrual cycle.  Pregnancy history. Screening  You may have the following tests or measurements:  Height, weight, and BMI.  Blood pressure.  Lipid and cholesterol levels. These may be checked every 5 years, or more frequently if you are over 15 years old.  Skin check.  Lung cancer screening. You may have this screening every year starting at age 48 if you have a 30-pack-year history of smoking and currently smoke or have quit within the past 15 years.  Fecal occult blood test (FOBT) of the stool. You may have this test every year starting at age 11.  Flexible sigmoidoscopy or colonoscopy. You may have a sigmoidoscopy every 5 years or a colonoscopy every 10 years starting at age 21.  Hepatitis C blood test.  Hepatitis B blood test.  Sexually transmitted disease (STD) testing.  Diabetes screening. This is done by checking your blood sugar (glucose) after you have not eaten for a while (fasting). You may have this done every 1-3 years.  Mammogram. This may be done every 1-2 years. Talk to your health care provider about when you should start having regular mammograms. This may depend on whether you have a family history of breast cancer.  BRCA-related cancer screening. This may be done if you have a family history of breast, ovarian, tubal, or peritoneal cancers.  Pelvic exam and Pap test. This may be done every 3 years starting at age 70. Starting at age 23, this may be done every  5 years if you have a Pap test in combination with an HPV test.  Bone density scan. This is done to screen for osteoporosis. You may have this scan if you are at high risk for osteoporosis. Discuss your test results, treatment options, and if necessary, the need for more tests with your  health care provider. Vaccines  Your health care provider may recommend certain vaccines, such as:  Influenza vaccine. This is recommended every year.  Tetanus, diphtheria, and acellular pertussis (Tdap, Td) vaccine. You may need a Td booster every 10 years.  Zoster vaccine. You may need this after age 80.  Pneumococcal 13-valent conjugate (PCV13) vaccine. You may need this if you have certain conditions and were not previously vaccinated.  Pneumococcal polysaccharide (PPSV23) vaccine. You may need one or two doses if you smoke cigarettes or if you have certain conditions. Talk to your health care provider about which screenings and vaccines you need and how often you need them. This information is not intended to replace advice given to you by your health care provider. Make sure you discuss any questions you have with your health care provider. Document Released: 05/03/2015 Document Revised: 12/25/2015 Document Reviewed: 02/05/2015 Elsevier Interactive Patient Education  2017 [place] Prevention in the Home Falls can cause injuries. They can happen to people of all ages. There are many things you can do to make your home safe and to help prevent falls. What can I do on the outside of my home?  Regularly fix the edges of walkways and driveways and fix any cracks.  Remove anything that might make you trip as you walk through a door, such as a raised step or threshold.  Trim any bushes or trees on the path to your home.  Use bright outdoor lighting.  Clear any walking paths of anything that might make someone trip, such as rocks or tools.  Regularly check to see if handrails are loose or broken. Make sure that both sides of any steps have handrails.  Any raised decks and porches should have guardrails on the edges.  Have any leaves, snow, or ice cleared regularly.  Use sand or salt on walking paths during winter.  Clean up any spills in your garage right away.  This includes oil or grease spills. What can I do in the bathroom?  Use night lights.  Install grab bars by the toilet and in the tub and shower. Do not use towel bars as grab bars.  Use non-skid mats or decals in the tub or shower.  If you need to sit down in the shower, use a plastic, non-slip stool.  Keep the floor dry. Clean up any water that spills on the floor as soon as it happens.  Remove soap buildup in the tub or shower regularly.  Attach bath mats securely with double-sided non-slip rug tape.  Do not have throw rugs and other things on the floor that can make you trip. What can I do in the bedroom?  Use night lights.  Make sure that you have a light by your bed that is easy to reach.  Do not use any sheets or blankets that are too big for your bed. They should not hang down onto the floor.  Have a firm chair that has side arms. You can use this for support while you get dressed.  Do not have throw rugs and other things on the floor that can make you trip. What can I  do in the kitchen?  Clean up any spills right away.  Avoid walking on wet floors.  Keep items that you use a lot in easy-to-reach places.  If you need to reach something above you, use a strong step stool that has a grab bar.  Keep electrical cords out of the way.  Do not use floor polish or wax that makes floors slippery. If you must use wax, use non-skid floor wax.  Do not have throw rugs and other things on the floor that can make you trip. What can I do with my stairs?  Do not leave any items on the stairs.  Make sure that there are handrails on both sides of the stairs and use them. Fix handrails that are broken or loose. Make sure that handrails are as long as the stairways.  Check any carpeting to make sure that it is firmly attached to the stairs. Fix any carpet that is loose or worn.  Avoid having throw rugs at the top or bottom of the stairs. If you do have throw rugs, attach them  to the floor with carpet tape.  Make sure that you have a light switch at the top of the stairs and the bottom of the stairs. If you do not have them, ask someone to add them for you. What else can I do to help prevent falls?  Wear shoes that:  Do not have high heels.  Have rubber bottoms.  Are comfortable and fit you well.  Are closed at the toe. Do not wear sandals.  If you use a stepladder:  Make sure that it is fully opened. Do not climb a closed stepladder.  Make sure that both sides of the stepladder are locked into place.  Ask someone to hold it for you, if possible.  Clearly mark and make sure that you can see:  Any grab bars or handrails.  First and last steps.  Where the edge of each step is.  Use tools that help you move around (mobility aids) if they are needed. These include:  Canes.  Walkers.  Scooters.  Crutches.  Turn on the lights when you go into a dark area. Replace any light bulbs as soon as they burn out.  Set up your furniture so you have a clear path. Avoid moving your furniture around.  If any of your floors are uneven, fix them.  If there are any pets around you, be aware of where they are.  Review your medicines with your doctor. Some medicines can make you feel dizzy. This can increase your chance of falling. Ask your doctor what other things that you can do to help prevent falls. This information is not intended to replace advice given to you by your health care provider. Make sure you discuss any questions you have with your health care provider. Document Released: 01/31/2009 Document Revised: 09/12/2015 Document Reviewed: 05/11/2014 Elsevier Interactive Patient Education  2017 [employer].

## 2016-09-21 NOTE — Progress Notes (Signed)
Subjective:   Laura KiefKathy [name] is a 56 y.o. female who presents for an Initial Medicare Annual Wellness Visit.     Objective:    Today's Vitals   09/21/16 0956  BP: 130/82  Pulse: 90  Temp: 97.6 F (36.4 C)  TempSrc: Oral  SpO2: 93%  Weight: 213 lb (96.6 kg)  Height: 5\' 6"  (1.676 m)  PainSc: 6    Body mass index is 34.38 kg/m.   Current Medications (verified) Outpatient Encounter Prescriptions as of 09/21/2016  Medication Sig  . albuterol (PROVENTIL HFA;VENTOLIN HFA) 108 (90 Base) MCG/ACT inhaler Inhale 2 puffs into the lungs every 6 (six) hours as needed for wheezing. For shortness of breath  . ALPRAZolam (XANAX) 0.5 MG tablet Take one tablet by mouth once daily as needed for anxiety  . oxyCODONE-acetaminophen (PERCOCET) 10-325 MG tablet Take 1 tablet by mouth every 6 (six) hours as needed for pain.   No facility-administered encounter medications on file as of 09/21/2016.     Allergies (verified) Aspirin and Penicillins   History: Past Medical History:  Diagnosis Date  . Acute bronchitis   . Chronic back pain   . Complication of anesthesia    "didn't wake up"  . Cough   . Insomnia, unspecified   . Other malaise and fatigue   . Other specified diseases of blood and blood-forming organs(289.89)   . Tobacco use disorder    Past Surgical History:  Procedure Laterality Date  . ESOPHAGOGASTRODUODENOSCOPY N/A 03/17/2013   Procedure: ESOPHAGOGASTRODUODENOSCOPY (EGD);  Surgeon: Meryl DareMalcolm T Stark, MD;  [facility];  Service: Endoscopy;  Laterality: N/A;  . FRACTURE SURGERY    . TONSILLECTOMY     Family History  Problem Relation Age of Onset  . Arthritis Mother   . Hypertension Mother   . Hypertension Sister   . Hypertension Brother   . Hypertension Sister   . Hypertension Sister   . Hypertension Sister    Social History   Occupational History  . Not on file.   Social History Main Topics  . Smoking status: Current Every Day Smoker    Packs/day: 0.50      Years: 12.00    Types: Cigarettes  . Smokeless tobacco: Never Used  . Alcohol use 2.4 oz/week    2 Cans of beer, 2 Shots of liquor per week     Comment: ocassional  . Drug use: No  . Sexual activity: Yes    Tobacco Counseling Ready to quit: Not Answered Counseling given: Not Answered   Activities of Daily Living In your present state of health, do you have any difficulty performing the following activities: 09/21/2016  Hearing? N  Vision? N  Difficulty concentrating or making decisions? N  Walking or climbing stairs? N  Dressing or bathing? N  Doing errands, shopping? N  Preparing Food and eating ? N  Using the Toilet? N  In the past six months, have you accidently leaked urine? N  Do you have problems with loss of bowel control? N  Managing your Medications? N  Managing your Finances? N  Housekeeping or managing your Housekeeping? N  Some recent data might be hidden    Immunizations and Health Maintenance Immunization History  Administered Date(s) Administered  . Influenza Whole 05/16/2012  . Influenza,inj,Quad PF,36+ Mos 03/02/2013, 04/19/2014, 02/22/2015, 03/23/2016  . Pneumococcal Conjugate-13 02/22/2015  . Pneumococcal Polysaccharide-23 03/02/2013  . Tdap 10/07/2015   Health Maintenance Due  Topic Date Due  . Hepatitis C Screening  [birth year]  .  HIV Screening  [birth year]1977  . MAMMOGRAM  09/08/2012    Patient Care Team: Sharon Seller, NP as PCP - General (Nurse Practitioner)  Indicate any recent Medical Services you may have received from other than [facility] providers in the past year (date may be approximate).     Assessment:   This is a routine wellness examination for Laura [name].   Hearing/Vision screen No exam data present  Dietary issues and exercise activities discussed: Current Exercise Habits: Home exercise routine, Frequency (Times/Week): 1, Intensity: Mild, Exercise limited by: None identified  Goals    . Eat More          Patient will eat  small meals throughout the day.      Depression Screen PHQ 2/9 Scores 09/21/2016 03/23/2016 03/25/2015 11/10/2012 07/28/2012  PHQ - 2 Score 0 0 0 0 1    Fall Risk Fall Risk  09/21/2016 03/23/2016 03/25/2015 02/22/2015 09/03/2014  Falls in the past year? No No No No No  Number falls in past yr: - - - - -  Injury with Fall? - - - - -    Cognitive Function:        Screening Tests Health Maintenance  Topic Date Due  . Hepatitis C Screening  16-Aug-1960  . HIV Screening  09/[birth year]1977  . MAMMOGRAM  09/08/2012  . COLONOSCOPY  05/20/2020 (Originally 09/08/2012)  . PAP SMEAR  04/21/2023 (Originally 09/09/1983)  . INFLUENZA VACCINE  11/18/2016  . TETANUS/TDAP  10/06/2025      Plan:    I have personally reviewed and addressed the Medicare Annual Wellness questionnaire and have noted the following in the patient's chart:  A. Medical and social history B. Use of alcohol, tobacco or illicit drugs  C. Current medications and supplements D. Functional ability and status E.  Nutritional status F.  Physical activity G. Advance directives H. List of other physicians I.  Hospitalizations, surgeries, and ER visits in previous 12 months J.  Vitals K. Screenings to include hearing, vision, cognitive, depression L. Referrals and appointments - none  In addition, I have reviewed and discussed with patient certain preventive protocols, quality metrics, and best practice recommendations. A written personalized care plan for preventive services as well as general preventive health recommendations were provided to patient.  See attached scanned questionnaire for additional information.   Signed,   Annetta Maw, RN Nurse Health Advisor   Quick Notes   Health Maintenance: HIV and Hep C screen due     Abnormal Screen: 1st BP 150/82. 2nd BP: 130/82     Patient Concerns: None     Nurse Concerns: None

## 2016-09-21 NOTE — Patient Instructions (Addendum)
Start losartan by mouth daily for blood pressure    Health Maintenance, Female Adopting a healthy lifestyle and getting preventive care can go a long way to promote health and wellness. Talk with your health care provider about what schedule of regular examinations is right for you. This is a good chance for you to check in with your provider about disease prevention and staying healthy. In between checkups, there are plenty of things you can do on your own. Experts have done a lot of research about which lifestyle changes and preventive measures are most likely to keep you healthy. Ask your health care provider for more information. Weight and diet Eat a healthy diet  Be sure to include plenty of vegetables, fruits, low-fat dairy products, and lean protein.  Do not eat a lot of foods high in solid fats, added sugars, or salt.  Get regular exercise. This is one of the most important things you can do for your health. ? Most adults should exercise for at least 150 minutes each week. The exercise should increase your heart rate and make you sweat (moderate-intensity exercise). ? Most adults should also do strengthening exercises at least twice a week. This is in addition to the moderate-intensity exercise.  Maintain a healthy weight  Body mass index (BMI) is a measurement that can be used to identify possible weight problems. It estimates body fat based on height and weight. Your health care provider can help determine your BMI and help you achieve or maintain a healthy weight.  For females 78 years of age and older: ? A BMI below 18.5 is considered underweight. ? A BMI of 18.5 to 24.9 is normal. ? A BMI of 25 to 29.9 is considered overweight. ? A BMI of 30 and above is considered obese.  Watch levels of cholesterol and blood lipids  You should start having your blood tested for lipids and cholesterol at 54 years of age, then have this test every 5 years.  You may need to have your  cholesterol levels checked more often if: ? Your lipid or cholesterol levels are high. ? You are older than 54 years of age. ? You are at high risk for heart disease.  Cancer screening Lung Cancer  Lung cancer screening is recommended for adults 54-47 years old who are at high risk for lung cancer because of a history of smoking.  A yearly low-dose CT scan of the lungs is recommended for people who: ? Currently smoke. ? Have quit within the past 15 years. ? Have at least a 30-pack-year history of smoking. A pack year is smoking an average of one pack of cigarettes a day for 1 year.  Yearly screening should continue until it has been 15 years since you quit.  Yearly screening should stop if you develop a health problem that would prevent you from having lung cancer treatment.  Breast Cancer  Practice breast self-awareness. This means understanding how your breasts normally appear and feel.  It also means doing regular breast self-exams. Let your health care provider know about any changes, no matter how small.  If you are in your 20s or 30s, you should have a clinical breast exam (CBE) by a health care provider every 1-3 years as part of a regular health exam.  If you are 32 or older, have a CBE every year. Also consider having a breast X-ray (mammogram) every year.  If you have a family history of breast cancer, talk to your health care  provider about genetic screening.  If you are at high risk for breast cancer, talk to your health care provider about having an MRI and a mammogram every year.  Breast cancer gene (BRCA) assessment is recommended for women who have family members with BRCA-related cancers. BRCA-related cancers include: ? Breast. ? Ovarian. ? Tubal. ? Peritoneal cancers.  Results of the assessment will determine the need for genetic counseling and BRCA1 and BRCA2 testing.  Cervical Cancer Your health care provider may recommend that you be screened regularly  for cancer of the pelvic organs (ovaries, uterus, and vagina). This screening involves a pelvic examination, including checking for microscopic changes to the surface of your cervix (Pap test). You may be encouraged to have this screening done every 3 years, beginning at age 21.  For women ages 30-65, health care providers may recommend pelvic exams and Pap testing every 3 years, or they may recommend the Pap and pelvic exam, combined with testing for human papilloma virus (HPV), every 5 years. Some types of HPV increase your risk of cervical cancer. Testing for HPV may also be done on women of any age with unclear Pap test results.  Other health care providers may not recommend any screening for nonpregnant women who are considered low risk for pelvic cancer and who do not have symptoms. Ask your health care provider if a screening pelvic exam is right for you.  If you have had past treatment for cervical cancer or a condition that could lead to cancer, you need Pap tests and screening for cancer for at least 20 years after your treatment. If Pap tests have been discontinued, your risk factors (such as having a new sexual partner) need to be reassessed to determine if screening should resume. Some women have medical problems that increase the chance of getting cervical cancer. In these cases, your health care provider may recommend more frequent screening and Pap tests.  Colorectal Cancer  This type of cancer can be detected and often prevented.  Routine colorectal cancer screening usually begins at 54 years of age and continues through 54 years of age.  Your health care provider may recommend screening at an earlier age if you have risk factors for colon cancer.  Your health care provider may also recommend using home test kits to check for hidden blood in the stool.  A small camera at the end of a tube can be used to examine your colon directly (sigmoidoscopy or colonoscopy). This is done to  check for the earliest forms of colorectal cancer.  Routine screening usually begins at age 50.  Direct examination of the colon should be repeated every 5-10 years through 54 years of age. However, you may need to be screened more often if early forms of precancerous polyps or small growths are found.  Skin Cancer  Check your skin from head to toe regularly.  Tell your health care provider about any new moles or changes in moles, especially if there is a change in a mole's shape or color.  Also tell your health care provider if you have a mole that is larger than the size of a pencil eraser.  Always use sunscreen. Apply sunscreen liberally and repeatedly throughout the day.  Protect yourself by wearing long sleeves, pants, a wide-brimmed hat, and sunglasses whenever you are outside.  Heart disease, diabetes, and high blood pressure  High blood pressure causes heart disease and increases the risk of stroke. High blood pressure is more likely to develop in: ?   People who have blood pressure in the high end of the normal range (130-139/85-89 mm Hg). ? People who are overweight or obese. ? People who are African American.  If you are 18-39 years of age, have your blood pressure checked every 3-5 years. If you are 40 years of age or older, have your blood pressure checked every year. You should have your blood pressure measured twice-once when you are at a hospital or clinic, and once when you are not at a hospital or clinic. Record the average of the two measurements. To check your blood pressure when you are not at a hospital or clinic, you can use: ? An automated blood pressure machine at a pharmacy. ? A home blood pressure monitor.  If you are between 55 years and 79 years old, ask your health care provider if you should take aspirin to prevent strokes.  Have regular diabetes screenings. This involves taking a blood sample to check your fasting blood sugar level. ? If you are at a  normal weight and have a low risk for diabetes, have this test once every three years after 54 years of age. ? If you are overweight and have a high risk for diabetes, consider being tested at a younger age or more often. Preventing infection Hepatitis B  If you have a higher risk for hepatitis B, you should be screened for this virus. You are considered at high risk for hepatitis B if: ? You were born in a country where hepatitis B is common. Ask your health care provider which countries are considered high risk. ? Your parents were born in a high-risk country, and you have not been immunized against hepatitis B (hepatitis B vaccine). ? You have HIV or AIDS. ? You use needles to inject street drugs. ? You live with someone who has hepatitis B. ? You have had sex with someone who has hepatitis B. ? You get hemodialysis treatment. ? You take certain medicines for conditions, including cancer, organ transplantation, and autoimmune conditions.  Hepatitis C  Blood testing is recommended for: ? Everyone born from 1945 through 1965. ? Anyone with known risk factors for hepatitis C.  Sexually transmitted infections (STIs)  You should be screened for sexually transmitted infections (STIs) including gonorrhea and chlamydia if: ? You are sexually active and are younger than 54 years of age. ? You are older than 54 years of age and your health care provider tells you that you are at risk for this type of infection. ? Your sexual activity has changed since you were last screened and you are at an increased risk for chlamydia or gonorrhea. Ask your health care provider if you are at risk.  If you do not have HIV, but are at risk, it may be recommended that you take a prescription medicine daily to prevent HIV infection. This is called pre-exposure prophylaxis (PrEP). You are considered at risk if: ? You are sexually active and do not regularly use condoms or know the HIV status of your  partner(s). ? You take drugs by injection. ? You are sexually active with a partner who has HIV.  Talk with your health care provider about whether you are at high risk of being infected with HIV. If you choose to begin PrEP, you should first be tested for HIV. You should then be tested every 3 months for as long as you are taking PrEP. Pregnancy  If you are premenopausal and you may become pregnant, ask your health   care provider about preconception counseling.  If you may become pregnant, take 400 to 800 micrograms (mcg) of folic acid every day.  If you want to prevent pregnancy, talk to your health care provider about birth control (contraception). Osteoporosis and menopause  Osteoporosis is a disease in which the bones lose minerals and strength with aging. This can result in serious bone fractures. Your risk for osteoporosis can be identified using a bone density scan.  If you are 76 years of age or older, or if you are at risk for osteoporosis and fractures, ask your health care provider if you should be screened.  Ask your health care provider whether you should take a calcium or vitamin D supplement to lower your risk for osteoporosis.  Menopause may have certain physical symptoms and risks.  Hormone replacement therapy may reduce some of these symptoms and risks. Talk to your health care provider about whether hormone replacement therapy is right for you. Follow these instructions at home:  Schedule regular health, dental, and eye exams.  Stay current with your immunizations.  Do not use any tobacco products including cigarettes, chewing tobacco, or electronic cigarettes.  If you are pregnant, do not drink alcohol.  If you are breastfeeding, limit how much and how often you drink alcohol.  Limit alcohol intake to no more than 1 drink per day for nonpregnant women. One drink equals 12 ounces of beer, 5 ounces of wine, or 1 ounces of hard liquor.  Do not use street  drugs.  Do not share needles.  Ask your health care provider for help if you need support or information about quitting drugs.  Tell your health care provider if you often feel depressed.  Tell your health care provider if you have ever been abused or do not feel safe at home. This information is not intended to replace advice given to you by your health care provider. Make sure you discuss any questions you have with your health care provider. Document Released: 10/20/2010 Document Revised: 09/12/2015 Document Reviewed: 01/08/2015 Elsevier Interactive Patient Education  2018 [place] Eating Plan DASH stands for "Dietary Approaches to Stop Hypertension." The DASH eating plan is a healthy eating plan that has been shown to reduce high blood pressure (hypertension). It may also reduce your risk for type 2 diabetes, heart disease, and stroke. The DASH eating plan may also help with weight loss. What are tips for following this plan? General guidelines  Avoid eating more than 2,300 mg (milligrams) of salt (sodium) a day. If you have hypertension, you may need to reduce your sodium intake to 1,500 mg a day.  Limit alcohol intake to no more than 1 drink a day for nonpregnant women and 2 drinks a day for men. One drink equals 12 oz of beer, 5 oz of wine, or 1 oz of hard liquor.  Work with your health care provider to maintain a healthy body weight or to lose weight. Ask what an ideal weight is for you.  Get at least 30 minutes of exercise that causes your heart to beat faster (aerobic exercise) most days of the week. Activities may include walking, swimming, or biking.  Work with your health care provider or diet and nutrition specialist (dietitian) to adjust your eating plan to your individual calorie needs. Reading food labels  Check food labels for the amount of sodium per serving. Choose foods with less than 5 percent of the Daily Value of sodium. Generally, foods with less than  300  mg of sodium per serving fit into this eating plan.  To find whole grains, look for the word "whole" as the first word in the ingredient list. Shopping  Buy products labeled as "low-sodium" or "no salt added."  Buy fresh foods. Avoid canned foods and premade or frozen meals. Cooking  Avoid adding salt when cooking. Use salt-free seasonings or herbs instead of table salt or sea salt. Check with your health care provider or pharmacist before using salt substitutes.  Do not fry foods. Cook foods using healthy methods such as baking, boiling, grilling, and broiling instead.  Cook with heart-healthy oils, such as olive, canola, soybean, or sunflower oil. Meal planning   Eat a balanced diet that includes: ? 5 or more servings of fruits and vegetables each day. At each meal, try to fill half of your plate with fruits and vegetables. ? Up to 6-8 servings of whole grains each day. ? Less than 6 oz of lean meat, poultry, or fish each day. A 3-oz serving of meat is about the same size as a deck of cards. One egg equals 1 oz. ? 2 servings of low-fat dairy each day. ? A serving of nuts, seeds, or beans 5 times each week. ? Heart-healthy fats. Healthy fats called Omega-3 fatty acids are found in foods such as flaxseeds and coldwater fish, like sardines, salmon, and mackerel.  Limit how much you eat of the following: ? Canned or prepackaged foods. ? Food that is high in trans fat, such as fried foods. ? Food that is high in saturated fat, such as fatty meat. ? Sweets, desserts, sugary drinks, and other foods with added sugar. ? Full-fat dairy products.  Do not salt foods before eating.  Try to eat at least 2 vegetarian meals each week.  Eat more home-cooked food and less restaurant, buffet, and fast food.  When eating at a restaurant, ask that your food be prepared with less salt or no salt, if possible. What foods are recommended? The items listed may not be a complete list. Talk with  your dietitian about what dietary choices are best for you. Grains Whole-grain or whole-wheat bread. Whole-grain or whole-wheat pasta. Brown rice. [place]. Bulgur. Whole-grain and low-sodium cereals. Pita bread. Low-fat, low-sodium crackers. Whole-wheat flour tortillas. Vegetables Fresh or frozen vegetables (raw, steamed, roasted, or grilled). Low-sodium or reduced-sodium tomato and vegetable juice. Low-sodium or reduced-sodium tomato sauce and tomato paste. Low-sodium or reduced-sodium canned vegetables. Fruits All fresh, dried, or frozen fruit. Canned fruit in natural juice (without added sugar). Meat and other protein foods Skinless chicken or [place]. Ground chicken or [place]. Pork with fat trimmed off. Fish and seafood. Egg whites. Dried beans, peas, or lentils. Unsalted nuts, nut butters, and seeds. Unsalted canned beans. Lean cuts of beef with fat trimmed off. Low-sodium, lean deli meat. Dairy Low-fat (1%) or fat-free (skim) milk. Fat-free, low-fat, or reduced-fat cheeses. Nonfat, low-sodium ricotta or cottage cheese. Low-fat or nonfat yogurt. Low-fat, low-sodium cheese. Fats and oils Soft margarine without trans fats. Vegetable oil. Low-fat, reduced-fat, or light mayonnaise and salad dressings (reduced-sodium). Canola, safflower, olive, soybean, and sunflower oils. Avocado. Seasoning and other foods Herbs. Spices. Seasoning mixes without salt. Unsalted popcorn and pretzels. Fat-free sweets. What foods are not recommended? The items listed may not be a complete list. Talk with your dietitian about what dietary choices are best for you. Grains Baked goods made with fat, such as croissants, muffins, or some breads. Dry pasta or rice meal packs. Vegetables Creamed or  fried vegetables. Vegetables in a cheese sauce. Regular canned vegetables (not low-sodium or reduced-sodium). Regular canned tomato sauce and paste (not low-sodium or reduced-sodium). Regular tomato and vegetable juice  (not low-sodium or reduced-sodium). Angie Fava. Olives. Fruits Canned fruit in a light or heavy syrup. Fried fruit. Fruit in cream or butter sauce. Meat and other protein foods Fatty cuts of meat. Ribs. Fried meat. Berniece Salines. Sausage. Bologna and other processed lunch meats. Salami. Fatback. Hotdogs. Bratwurst. Salted nuts and seeds. Canned beans with added salt. Canned or smoked fish. Whole eggs or egg yolks. Chicken or [place] with skin. Dairy Whole or 2% milk, cream, and half-and-half. Whole or full-fat cream cheese. Whole-fat or sweetened yogurt. Full-fat cheese. Nondairy creamers. Whipped toppings. Processed cheese and cheese spreads. Fats and oils Butter. Stick margarine. Lard. Shortening. Ghee. Bacon fat. Tropical oils, such as coconut, palm kernel, or palm oil. Seasoning and other foods Salted popcorn and pretzels. Onion salt, garlic salt, seasoned salt, table salt, and sea salt. Worcestershire sauce. Tartar sauce. Barbecue sauce. Teriyaki sauce. Soy sauce, including reduced-sodium. Steak sauce. Canned and packaged gravies. Fish sauce. Oyster sauce. Cocktail sauce. Horseradish that you find on the shelf. Ketchup. Mustard. Meat flavorings and tenderizers. Bouillon cubes. Hot sauce and Tabasco sauce. Premade or packaged marinades. Premade or packaged taco seasonings. Relishes. Regular salad dressings. Where to find more information:  National Heart, Lung, and [place]: https://wilson-eaton.com/  American Heart Association: www.heart.org Summary  The DASH eating plan is a healthy eating plan that has been shown to reduce high blood pressure (hypertension). It may also reduce your risk for type 2 diabetes, heart disease, and stroke.  With the DASH eating plan, you should limit salt (sodium) intake to 2,300 mg a day. If you have hypertension, you may need to reduce your sodium intake to 1,500 mg a day.  When on the DASH eating plan, aim to eat more fresh fruits and vegetables, whole grains, lean  proteins, low-fat dairy, and heart-healthy fats.  Work with your health care provider or diet and nutrition specialist (dietitian) to adjust your eating plan to your individual calorie needs. This information is not intended to replace advice given to you by your health care provider. Make sure you discuss any questions you have with your health care provider. Document Released: 03/26/2011 Document Revised: 03/30/2016 Document Reviewed: 03/30/2016 Elsevier Interactive Patient Education  2017 [employer].

## 2016-09-21 NOTE — Progress Notes (Signed)
Provider: Sharon SellerEubanks, Inas Avena K, NP  Patient Care Team: Sharon SellerEubanks, Carah Barrientes K, NP as PCP - General (Nurse Practitioner)  Extended Emergency Contact Information Primary Emergency Contact: Gayland Curryiddle,[place]  [place] Home Phone: [phone] Relation: Friend Allergies  Allergen Reactions  . Aspirin Hives  . Penicillins Hives   Code Status: FULL Goals of Care: Advanced Directive information Advanced Directives 09/21/2016  Does Patient Have a Medical Advance Directive? No  Would patient like information on creating a medical advance directive? No - Patient declined  Pre-existing out of facility DNR order (yellow form or pink MOST form) -     Chief Complaint  Patient presents with  . Medical Management of Chronic Issues    Pt is being seen for a complete physical and follow up to chronic condtitions.     HPI: Patient is a 56 y.o. female seen in today for an annual wellness exam.   Major illnesses or hospitalization in the last year; none  [facility] 09/21/2016 03/23/2016 03/25/2015 11/10/2012 07/28/2012  Decreased Interest 0 0 0 0 1  Down, Depressed, Hopeless 0 0 0 0 0  PHQ - 2 Score 0 0 0 0 1    Fall Risk  09/21/2016 09/21/2016 03/23/2016 03/25/2015 02/22/2015  Falls in the past year? No No No No No  Number falls in past yr: - - - - -  Injury with Fall? - - - - -   No flowsheet data found.   Health Maintenance  Topic Date Due  . Hepatitis C Screening  Jan 03, 1961  . HIV Screening  09/08/1977  . MAMMOGRAM  10/18/2018 (Originally 09/08/2012)  . COLONOSCOPY  05/20/2020 (Originally 09/08/2012)  . PAP SMEAR  04/21/2023 (Originally 09/09/1983)  . INFLUENZA VACCINE  11/18/2016  . TETANUS/TDAP  10/06/2025   Had colo-gard last month.  Has uterus but does not wish to have PAP done.  Does not want to have mammogram, agreeable to breast exam in office.   Diet?none Exercise? Limited due to back pain.  Dentition:yearly  Pain: severe back pain.  Past Medical  History:  Diagnosis Date  . Acute bronchitis   . Chronic back pain   . Complication of anesthesia    "didn't wake up"  . Cough   . Insomnia, unspecified   . Other malaise and fatigue   . Other specified diseases of blood and blood-forming organs(289.89)   . Tobacco use disorder     Past Surgical History:  Procedure Laterality Date  . ESOPHAGOGASTRODUODENOSCOPY N/A 03/17/2013   Procedure: ESOPHAGOGASTRODUODENOSCOPY (EGD);  Surgeon: Meryl DareMalcolm T Stark, MD;  [facility];  Service: Endoscopy;  Laterality: N/A;  . FRACTURE SURGERY    . TONSILLECTOMY      Social History   Social History  . Marital status: Significant Other    Spouse name: N/A  . Number of children: N/A  . Years of education: N/A   Social History Main Topics  . Smoking status: Current Every Day Smoker    Packs/day: 0.50    Years: 12.00    Types: Cigarettes  . Smokeless tobacco: Never Used  . Alcohol use 2.4 oz/week    2 Cans of beer, 2 Shots of liquor per week     Comment: ocassional  . Drug use: No  . Sexual activity: Yes   Other Topics Concern  . None   Social History Narrative   Single   Smokes 1/2 ppd   Alcohol -2 beers or liquid occasionally   Exercise none  Walks with cane   No Advance Directives    Family History  Problem Relation Age of Onset  . Arthritis Mother   . Hypertension Mother   . Hypertension Sister   . Hypertension Brother   . Hypertension Sister   . Hypertension Sister   . Hypertension Sister     Review of Systems:  Review of Systems  Constitutional: Negative for activity change, appetite change, fatigue and unexpected weight change.  Eyes: Negative.   Respiratory: Negative for cough and shortness of breath.   Cardiovascular: Negative for chest pain, palpitations and leg swelling.  Gastrointestinal: Positive for constipation (occasionally ). Negative for diarrhea.  Musculoskeletal: Positive for arthralgias and back pain. Negative for myalgias.  Skin:  Negative for color change and wound.  Neurological: Negative for dizziness, tremors and weakness.  Psychiatric/Behavioral: Negative for agitation, behavioral problems and confusion. The patient is nervous/anxious (stable).      Allergies as of 09/21/2016      Reactions   Aspirin Hives   Penicillins Hives      Medication List       Accurate as of 09/21/16 11:35 AM. Always use your most recent med list.          albuterol 108 (90 Base) MCG/ACT inhaler Commonly known as:  PROVENTIL HFA;VENTOLIN HFA Inhale 2 puffs into the lungs every 6 (six) hours as needed for wheezing. For shortness of breath   ALPRAZolam 0.5 MG tablet Commonly known as:  XANAX Take one tablet by mouth once daily as needed for anxiety   oxyCODONE-acetaminophen 10-325 MG tablet Commonly known as:  PERCOCET Take 1 tablet by mouth every 6 (six) hours as needed for pain.         Physical Exam: Vitals:   09/21/16 1119  BP: 130/82  Pulse: 90  Resp: 18  Temp: 97.6 F (36.4 C)  TempSrc: Oral  SpO2: 93%  Weight: 213 lb (96.6 kg)  Height: 5\' 6"  (1.676 m)   Body mass index is 34.38 kg/m. Physical Exam  Constitutional: She is oriented to person, place, and time. She appears well-developed and well-nourished.  HENT:  Head: Normocephalic and atraumatic.  Right Ear: External ear normal.  Left Ear: External ear normal.  Nose: Nose normal.  Mouth/Throat: Oropharynx is clear and moist. No oropharyngeal exudate.  Eyes: Conjunctivae and EOM are normal. Pupils are equal, round, and reactive to light.  Neck: Normal range of motion. Neck supple. No JVD present.  Cardiovascular: Normal rate, regular rhythm and intact distal pulses.   Murmur heard. Pulmonary/Chest: Effort normal and breath sounds normal. Right breast exhibits no inverted nipple, no mass, no nipple discharge, no skin change and no tenderness. Left breast exhibits no inverted nipple, no mass, no nipple discharge, no skin change and no tenderness.    Abdominal: Soft. Bowel sounds are normal. She exhibits no distension. There is no tenderness.  Musculoskeletal: Normal range of motion. She exhibits tenderness.       Lumbar back: She exhibits tenderness.       Back:  Lymphadenopathy:    She has no cervical adenopathy.  Neurological: She is alert and oriented to person, place, and time. She has normal reflexes.  Skin: Skin is warm and dry.  Psychiatric: She has a normal mood and affect.   Labs reviewed: Basic Metabolic Panel: No results for input(s): NA, K, CL, CO2, GLUCOSE, BUN, CREATININE, CALCIUM, MG, PHOS, TSH in the last 8760 hours. Liver Function Tests: No results for input(s): AST, ALT, ALKPHOS, BILITOT, PROT,  ALBUMIN in the last 8760 hours. No results for input(s): LIPASE, AMYLASE in the last 8760 hours. No results for input(s): AMMONIA in the last 8760 hours. CBC: No results for input(s): WBC, NEUTROABS, HGB, HCT, MCV, PLT in the last 8760 hours. Lipid Panel: No results for input(s): CHOL, HDL, LDLCALC, TRIG, CHOLHDL, LDLDIRECT in the last 8760 hours. Lab Results  Component Value Date   HGBA1C 4.8 07/01/2015    Procedures: No results found.  Assessment/Plan 1. Annual physical exam Pt is doing well without new issues at this time.  The patient was counseled regarding the appropriate use of alcohol, regular self-examination of the breasts on a monthly basis (declines mammogram), prevention of dental and periodontal disease, diet, regular sustained exercise for at least 30 minutes 5 times per week, encouraged routine screening interval for mammogram as recommended by the American Cancer Society and ACOG, importance of regular PAP smears, smoking cessation, tobacco use,  and recommended schedule for GI hemoccult testing, colonoscopy, cholesterol, thyroid and diabetes screening. - CBC with Differential/Platelets - COMPLETE METABOLIC PANEL WITH GFR - Lipid Panel  2. Tobacco abuse -encouraged cessation but not willing to  quit at this time.   3. Need for shingles vaccine - Zoster Vac Recomb Adjuvanted [facility]) injection; Inject 0.5 mLs into the muscle once.  Dispense: 0.5 mL; Refill: 1  4. Essential hypertension -dash diet -to take BP twice weekly and record and bring back to office in 4 weeks for recheck.  Follow up in 4 weeks for blood pressure and reports she will get PAP Linlee Cromie K. Biagio Borg  [facility] [phone] 8 am - 5 pm) [phone] (after hours)

## 2016-09-22 LAB — COMPLETE METABOLIC PANEL WITH GFR
ALBUMIN: 4.2 g/dL (ref 3.6–5.1)
ALK PHOS: 110 U/L (ref 33–130)
ALT: 13 U/L (ref 6–29)
AST: 27 U/L (ref 10–35)
BILIRUBIN TOTAL: 0.5 mg/dL (ref 0.2–1.2)
BUN: 7 mg/dL (ref 7–25)
CALCIUM: 9.2 mg/dL (ref 8.6–10.4)
CO2: 19 mmol/L — ABNORMAL LOW (ref 20–31)
CREATININE: 0.47 mg/dL — AB (ref 0.50–1.05)
Chloride: 102 mmol/L (ref 98–110)
GFR, Est African American: >89 mL/min (ref >=60)
GFR, Est Non African American: >89 mL/min (ref >=60)
GLUCOSE: 93 mg/dL (ref 65–99)
POTASSIUM: 4.1 mmol/L (ref 3.5–5.3)
SODIUM: 137 mmol/L (ref 135–146)
TOTAL PROTEIN: 7.5 g/dL (ref 6.1–8.1)

## 2016-09-22 LAB — LIPID PANEL
CHOLESTEROL: 206 mg/dL — AB (ref <200)
HDL: 106 mg/dL (ref >50)
LDL Cholesterol: 79 mg/dL (ref <100)
Total CHOL/HDL Ratio: 1.9 Ratio (ref <5.0)
Triglycerides: 104 mg/dL (ref <150)
VLDL: 21 mg/dL (ref <30)

## 2016-09-22 LAB — B12 AND FOLATE PANEL
Folate: 2.4 ng/mL — ABNORMAL LOW (ref >5.4)
VITAMIN B 12: 329 pg/mL (ref 200–1100)

## 2016-09-25 ENCOUNTER — Other Ambulatory Visit: Payer: Self-pay

## 2016-09-25 MED ORDER — OXYCODONE-ACETAMINOPHEN 10-325 MG PO TABS: 1.0000 | ORAL_TABLET | Freq: Four times a day (QID) | ORAL | 0 refills | Status: DC | PRN

## 2016-09-25 MED ORDER — ALPRAZOLAM 0.5 MG PO TABS: ORAL_TABLET | ORAL | 0 refills | Status: DC

## 2016-09-29 ENCOUNTER — Encounter: Payer: Self-pay | Admitting: Nurse Practitioner

## 2016-09-29 DIAGNOSIS — E669 Obesity, unspecified: Secondary | ICD-10-CM | POA: Insufficient documentation

## 2016-10-19 ENCOUNTER — Encounter: Payer: Medicare Other | Admitting: Nurse Practitioner

## 2016-10-23 ENCOUNTER — Other Ambulatory Visit: Payer: Self-pay

## 2016-10-23 MED ORDER — ALPRAZOLAM 0.5 MG PO TABS: ORAL_TABLET | ORAL | 0 refills | Status: DC

## 2016-10-23 MED ORDER — OXYCODONE-ACETAMINOPHEN 10-325 MG PO TABS: 1.0000 | ORAL_TABLET | Freq: Four times a day (QID) | ORAL | 0 refills | Status: DC | PRN

## 2016-10-26 ENCOUNTER — Ambulatory Visit (INDEPENDENT_AMBULATORY_CARE_PROVIDER_SITE_OTHER): Payer: Medicare Other | Admitting: Nurse Practitioner

## 2016-10-26 ENCOUNTER — Encounter: Payer: Self-pay | Admitting: Nurse Practitioner

## 2016-10-26 VITALS — BP 122/80 | HR 86 | Temp 98.2°F | Resp 18 | Ht 66.0 in | Wt 213.4 lb

## 2016-10-26 DIAGNOSIS — Z9189 Other specified personal risk factors, not elsewhere classified: Secondary | ICD-10-CM | POA: Diagnosis not present

## 2016-10-26 DIAGNOSIS — Z124 Encounter for screening for malignant neoplasm of cervix: Secondary | ICD-10-CM

## 2016-10-26 DIAGNOSIS — F1010 Alcohol abuse, uncomplicated: Secondary | ICD-10-CM

## 2016-10-26 DIAGNOSIS — I10 Essential (primary) hypertension: Secondary | ICD-10-CM | POA: Diagnosis not present

## 2016-10-26 NOTE — Progress Notes (Signed)
Careteam: Patient Care Team: Sharon SellerEubanks, Alsha Meland K, NP as PCP - General (Nurse Practitioner)  Advanced Directive information Does Patient Have a Medical Advance Directive?: No  Allergies  Allergen Reactions  . Aspirin Hives  . Penicillins Hives    Chief Complaint  Patient presents with  . Follow-up    Pt is being seen for a 4 week follow up on blood pressure and PAP.      HPI: Patient is a 56 y.o. female seen in the office today to follow up blood pressure and PAP.  Blood pressure is improved at this time.  Not taking cozaar.  Pt conts to drink ETOH, states she drinks "a few" drinks daily but not in excess.  Taking folic acid due to folate def.  Willing to get PAP today.     Review of Systems:  Review of Systems  Constitutional: Negative for chills and fever.  HENT: Negative for hearing loss.   Eyes: Negative for blurred vision.  Respiratory: Negative for cough, shortness of breath and wheezing.   Cardiovascular: Negative for chest pain, palpitations and leg swelling.  Gastrointestinal: Negative for abdominal pain, blood in stool, constipation and melena.  Genitourinary: Negative for dysuria, frequency and urgency.  Musculoskeletal: Positive for back pain and joint pain. Negative for falls.  Skin: Negative for rash.  Neurological: Positive for tingling and sensory change. Negative for dizziness and loss of consciousness.  Psychiatric/Behavioral: Negative for memory loss.    Past Medical History:  Diagnosis Date  . Acute bronchitis   . Chronic back pain   . Complication of anesthesia    "didn't wake up"  . Cough   . Insomnia, unspecified   . Other malaise and fatigue   . Other specified diseases of blood and blood-forming organs(289.89)   . Tobacco use disorder    Past Surgical History:  Procedure Laterality Date  . ESOPHAGOGASTRODUODENOSCOPY N/A 03/17/2013   Procedure: ESOPHAGOGASTRODUODENOSCOPY (EGD);  Surgeon: Meryl DareMalcolm T Stark, MD;  [facility];  Service: Endoscopy;  Laterality: N/A;  . FRACTURE SURGERY    . TONSILLECTOMY     Social History:   reports that she has been smoking Cigarettes.  She has a 6.00 pack-year smoking history. She has never used smokeless tobacco. She reports that she drinks about 2.4 oz of alcohol per week . She reports that she does not use drugs.  Family History  Problem Relation Age of Onset  . Arthritis Mother   . Hypertension Mother   . Hypertension Sister   . Hypertension Brother   . Hypertension Sister   . Hypertension Sister   . Hypertension Sister     Medications: Patient's Medications  New Prescriptions   No medications on file  Previous Medications   ALBUTEROL (PROVENTIL HFA;VENTOLIN HFA) 108 (90 BASE) MCG/ACT INHALER    Inhale 2 puffs into the lungs every 6 (six) hours as needed for wheezing. For shortness of breath   ALPRAZOLAM (XANAX) 0.5 MG TABLET    Take one tablet by mouth once daily as needed for anxiety   FOLIC ACID (FOLVITE) 1 MG TABLET    Take 1 mg by mouth daily.   LOSARTAN (COZAAR) 25 MG TABLET    Take 1 tablet (25 mg total) by mouth daily. For blood pressure   OXYCODONE-ACETAMINOPHEN (PERCOCET) 10-325 MG TABLET    Take 1 tablet by mouth every 6 (six) hours as needed for pain.  Modified Medications   No medications on file  Discontinued Medications   No medications  on file     Physical Exam:  Vitals:   10/26/16 0856  BP: 122/80  Pulse: 86  Resp: 18  Temp: 98.2 F (36.8 C)  TempSrc: Oral  SpO2: 98%  Weight: 213 lb 6.4 oz (96.8 kg)  Height: 5\' 6"  (1.676 m)   Body mass index is 34.44 kg/m.  Physical Exam  Constitutional: She is oriented to person, place, and time. She appears well-developed and well-nourished. No distress.  Cardiovascular: Normal rate, regular rhythm and normal heart sounds.   Pulmonary/Chest: Effort normal and breath sounds normal. She has no wheezes.  Abdominal: Soft. Bowel sounds are normal. She exhibits no distension.    Genitourinary: Rectum normal, vagina normal and uterus normal. Cervix exhibits friability.  Musculoskeletal: She exhibits no edema or tenderness.  Neurological: She is alert and oriented to person, place, and time.  Skin: Skin is warm and dry.  Psychiatric: She has a normal mood and affect.    Labs reviewed: Basic Metabolic Panel:  Recent Labs  16/10/96 1212  NA 137  K 4.1  CL 102  CO2 19*  GLUCOSE 93  BUN 7  CREATININE 0.47*  CALCIUM 9.2   Liver Function Tests:  Recent Labs  09/21/16 1212  AST 27  ALT 13  ALKPHOS 110  BILITOT 0.5  PROT 7.5  ALBUMIN 4.2   No results for input(s): LIPASE, AMYLASE in the last 8760 hours. No results for input(s): AMMONIA in the last 8760 hours. CBC:  Recent Labs  09/21/16 1212  WBC 3.1*  NEUTROABS 1,705  HGB 11.9  HCT 35.2  MCV 119.7*  PLT 295   Lipid Panel:  Recent Labs  09/21/16 1212  CHOL 206*  HDL 106  LDLCALC 79  TRIG 045  CHOLHDL 1.9   TSH: No results for input(s): TSH in the last 8760 hours. A1C: Lab Results  Component Value Date   HGBA1C 4.8 07/01/2015     Assessment/Plan 1. Essential hypertension Stable, not current on medication -dash diet  2. ETOH abuse -encouraged cessation. Discussed risk of adverse drug reaction, overdose, respiratory suppression and death by mixing ETOH with narcotics. Pt voices understanding and states she will decrease ETOH and stop   3. At risk for adverse drug reaction See number 2  4. Cervical cancer screening - PAP, Image Guided [[facility], [facility]]   Danaiya Steadman K. Biagio Borg  [facility] [phone] 8 am - 5 pm) [phone] (after hours)

## 2016-10-28 LAB — PAP IG (IMAGE GUIDED)

## 2016-10-30 ENCOUNTER — Other Ambulatory Visit: Payer: Self-pay | Admitting: Nurse Practitioner

## 2016-10-30 DIAGNOSIS — R87619 Unspecified abnormal cytological findings in specimens from cervix uteri: Secondary | ICD-10-CM

## 2016-10-30 NOTE — Progress Notes (Signed)
Spoke with pt and gave results, agreeable to go to GYN for further work up

## 2016-11-23 ENCOUNTER — Other Ambulatory Visit: Payer: Self-pay | Admitting: *Deleted

## 2016-11-23 MED ORDER — ALPRAZOLAM 0.5 MG PO TABS: ORAL_TABLET | ORAL | 0 refills | Status: DC

## 2016-11-23 MED ORDER — OXYCODONE-ACETAMINOPHEN 10-325 MG PO TABS: 1.0000 | ORAL_TABLET | Freq: Four times a day (QID) | ORAL | 0 refills | Status: DC | PRN

## 2016-11-23 NOTE — Telephone Encounter (Signed)
Patient requested and will pick up 

## 2016-11-24 ENCOUNTER — Ambulatory Visit: Payer: Medicare Other | Admitting: Obstetrics & Gynecology

## 2016-12-24 ENCOUNTER — Other Ambulatory Visit: Payer: Self-pay | Admitting: *Deleted

## 2016-12-24 MED ORDER — ALPRAZOLAM 0.5 MG PO TABS: ORAL_TABLET | ORAL | 0 refills | Status: DC

## 2016-12-24 MED ORDER — OXYCODONE-ACETAMINOPHEN 10-325 MG PO TABS: 1.0000 | ORAL_TABLET | Freq: Four times a day (QID) | ORAL | 0 refills | Status: DC | PRN

## 2016-12-24 NOTE — Telephone Encounter (Signed)
Patient requested and will pick up [place]CC[place] Database Checked.  

## 2017-01-22 ENCOUNTER — Other Ambulatory Visit: Payer: Self-pay | Admitting: *Deleted

## 2017-01-22 MED ORDER — ALPRAZOLAM 0.5 MG PO TABS: ORAL_TABLET | ORAL | 0 refills | Status: DC

## 2017-01-22 MED ORDER — OXYCODONE-ACETAMINOPHEN 10-325 MG PO TABS: 1.0000 | ORAL_TABLET | Freq: Four times a day (QID) | ORAL | 0 refills | Status: DC | PRN

## 2017-01-22 NOTE — Telephone Encounter (Signed)
Patient requested and will pick up NCCSRS Database Verified.  

## 2017-02-22 ENCOUNTER — Other Ambulatory Visit: Payer: Self-pay

## 2017-02-22 MED ORDER — ALPRAZOLAM 0.5 MG PO TABS: ORAL_TABLET | ORAL | 0 refills | Status: DC

## 2017-02-22 MED ORDER — OXYCODONE-ACETAMINOPHEN 10-325 MG PO TABS: 1.0000 | ORAL_TABLET | Freq: Four times a day (QID) | ORAL | 0 refills | Status: DC | PRN

## 2017-02-22 NOTE — Telephone Encounter (Signed)
NCarolina Narx checked 

## 2017-03-12 ENCOUNTER — Other Ambulatory Visit: Payer: Self-pay

## 2017-03-12 ENCOUNTER — Encounter (HOSPITAL_COMMUNITY): Payer: Self-pay

## 2017-03-12 ENCOUNTER — Emergency Department (HOSPITAL_COMMUNITY)
Admission: EM | Admit: 2017-03-12 | Discharge: 2017-03-12 | Disposition: A | Discharge status: Home / Self Care | Payer: Medicare Other | Attending: Emergency Medicine | Admitting: Emergency Medicine

## 2017-03-12 DIAGNOSIS — K047 Periapical abscess without sinus: Secondary | ICD-10-CM

## 2017-03-12 DIAGNOSIS — K0889 Other specified disorders of teeth and supporting structures: Secondary | ICD-10-CM | POA: Diagnosis present

## 2017-03-12 DIAGNOSIS — F17210 Nicotine dependence, cigarettes, uncomplicated: Secondary | ICD-10-CM | POA: Diagnosis not present

## 2017-03-12 MED ORDER — CHLORHEXIDINE GLUCONATE 0.12 % MT SOLN: 15.0000 mL | Freq: Two times a day (BID) | OROMUCOSAL | 0 refills | Status: DC

## 2017-03-12 MED ORDER — BUPIVACAINE-EPINEPHRINE (PF) 0.5% -1:200000 IJ SOLN
1.8000 mL | Freq: Once | INTRAMUSCULAR | Status: DC
  Filled 2017-03-12 (×2): qty 1.8

## 2017-03-12 MED ORDER — TRAMADOL HCL 50 MG PO TABS: 50.0000 mg | ORAL_TABLET | Freq: Four times a day (QID) | ORAL | 0 refills | Status: DC | PRN

## 2017-03-12 MED ORDER — CLINDAMYCIN HCL 150 MG PO CAPS: 300.0000 mg | ORAL_CAPSULE | Freq: Four times a day (QID) | ORAL | 0 refills | Status: DC

## 2017-03-12 NOTE — ED Provider Notes (Signed)
[facility] EMERGENCY DEPARTMENT Provider Note   CSN: [id number] Arrival date & time: 03/12/17  1026     History   Chief Complaint Chief Complaint  Patient presents with  . Oral Swelling    HPI Laura [name] is a 56 y.o. female.  HPI Laura [name] is a 56 y.o. female presents to emergency department complaining of facial swelling.  She states she noticed swelling to the upper gums approximately 3-4 days ago.  She states she has developed increased swelling since yesterday.  She does not wear a bridge on the upper teeth, that she has had for a long time and has not had any changes to it.  She denies any history of similar swelling or.  She denies any difficulty breathing or swallowing.  She denies any fever or chills.  She has tried warm compresses with no relief.  She has not noticed any drainage.  She denies any pain to her nose.  No other complaints.  Past Medical History:  Diagnosis Date  . Acute bronchitis   . Chronic back pain   . Complication of anesthesia    "didn't wake up"  . Cough   . Insomnia, unspecified   . Other malaise and fatigue   . Other specified diseases of blood and blood-forming organs(289.89)   . Tobacco use disorder     Patient Active Problem List   Diagnosis Date Noted  . Obesity (BMI 30.0-34.9) 09/29/2016  . Low back pain 10/07/2015  . Chronic night sweats 03/03/2015  . Generalized anxiety disorder 06/07/2014  . Allergic rhinitis 03/02/2013  . Panic attack 09/05/2012  . Insomnia 07/28/2012  . Depression 07/28/2012  . OA (osteoarthritis) of knee 07/28/2012    Past Surgical History:  Procedure Laterality Date  . ESOPHAGOGASTRODUODENOSCOPY N/A 03/17/2013   Procedure: ESOPHAGOGASTRODUODENOSCOPY (EGD);  Surgeon: Meryl Dare, MD;  Location: [facility] ENDOSCOPY;  Service: Endoscopy;  Laterality: N/A;  . FRACTURE SURGERY    . TONSILLECTOMY      OB History    No data available       Home Medications    Prior to Admission  medications   Medication Sig Start Date End Date Taking? Authorizing Provider  albuterol (PROVENTIL HFA;VENTOLIN HFA) 108 (90 Base) MCG/ACT inhaler Inhale 2 puffs into the lungs every 6 (six) hours as needed for wheezing. For shortness of breath 10/07/15   Renato Gails, Tiffany L, DO  ALPRAZolam Prudy Feeler) 0.5 MG tablet Take one tablet by mouth once daily as needed for anxiety 02/22/17   Reed, Tiffany L, DO  folic acid (FOLVITE) 1 MG tablet Take 1 mg by mouth daily.    [provider]  oxyCODONE-acetaminophen (PERCOCET) 10-325 MG tablet Take 1 tablet every 6 (six) hours as needed by mouth for pain. 02/22/17   Kermit Balo, DO    Family History Family History  Problem Relation Age of Onset  . Arthritis Mother   . Hypertension Mother   . Hypertension Sister   . Hypertension Brother   . Hypertension Sister   . Hypertension Sister   . Hypertension Sister     Social History Social History   Tobacco Use  . Smoking status: Current Every Day Smoker    Packs/day: 0.50    Years: 12.00    Pack years: 6.00    Types: Cigarettes  . Smokeless tobacco: Never Used  Substance Use Topics  . Alcohol use: Yes    Alcohol/week: 2.4 oz    Types: 2 Cans of beer, 2 Shots  of liquor per week    Comment: ocassional  . Drug use: No     Allergies   Aspirin and Penicillins   Review of Systems Review of Systems  Constitutional: Negative for chills and fever.  HENT: Positive for facial swelling.   Respiratory: Negative for cough, chest tightness and shortness of breath.   Cardiovascular: Negative for chest pain, palpitations and leg swelling.  Genitourinary: Negative for dysuria, flank pain and pelvic pain.  Musculoskeletal: Negative for arthralgias, myalgias, neck pain and neck stiffness.  Skin: Negative for rash.  Neurological: Positive for headaches. Negative for dizziness and weakness.  All other systems reviewed and are negative.    Physical Exam Updated Vital Signs BP (!) 140/92 (BP  Location: Right Arm)   Pulse (!) 103   Temp 98.1 F (36.7 C) (Oral)   Resp 16   Ht 5\' 7"  (1.702 m)   Wt 98 kg (216 lb)   SpO2 98%   BMI 33.83 kg/m   Physical Exam  Constitutional: She appears well-developed and well-nourished. No distress.  HENT:  Edentulous to the upper gum, mild swelling noted to the upper gum, just in front of the nose.  Tender to palpation.  No drainage.  Eyes: Conjunctivae are normal.  Neck: Neck supple.  Neurological: She is alert.  Skin: Skin is warm and dry.  Nursing note and vitals reviewed.    ED Treatments / Results  Labs (all labs ordered are listed, but only abnormal results are displayed) Labs Reviewed - No data to display  EKG  EKG Interpretation None       Radiology No results found.  Procedures .Marland Kitchen.Incision and Drainage Date/Time: 03/12/2017 1:44 PM Performed by: Jaynie CrumbleKirichenko, Tatanisha Cuthbert, PA-C Authorized by: Jaynie CrumbleKirichenko, Markevion Lattin, PA-C   Consent:    Consent obtained:  Verbal   Consent given by:  Patient   Risks discussed:  Bleeding, incomplete drainage, pain and infection   Alternatives discussed:  No treatment Location:    Type:  Abscess   Location:  Mouth Anesthesia (see MAR for exact dosages):    Anesthesia method:  Local infiltration   Local anesthetic:  Bupivacaine 0.5% WITH epi Procedure type:    Complexity:  Simple Procedure details:    Needle aspiration: yes     Needle size:  18 G   Drainage:  Purulent   Drainage amount:  Moderate Post-procedure details:    Patient tolerance of procedure:  Tolerated well, no immediate complications Comments:     Needle aspiration performed with 4cc of purulent drainage out using 18guage needle, placed just above right central incisor location. Pt tolerated it well   (including critical care time)  Medications Ordered in ED Medications  bupivacaine-epinephrine (MARCAINE W/ EPI) 0.5% -1:200000 injection 1.8 mL (not administered)     Initial Impression / Assessment and Plan / ED  Course  I have reviewed the triage vital signs and the nursing notes.  Pertinent labs & imaging results that were available during my care of the patient were reviewed by me and considered in my medical decision making (see chart for details).     Patient with an abscess to the upper gum, just above the central incisors.  Edentulous in that area, but wears a bridge.  I needle aspirated the area after performing local block with bupivacaine.  4 cc of purulent material was aspirated.  Patient tolerated procedure well.  Will discharge home on antibiotics, warm compresses, mouth rinses.  Follow-up as needed.  Return precautions discussed.  Vitals:  03/12/17 1032  BP: (!) 140/92  Pulse: (!) 103  Resp: 16  Temp: 98.1 F (36.7 C)  TempSrc: Oral  SpO2: 98%  Weight: 98 kg (216 lb)  Height: 5\' 7"  (1.702 m)     Final Clinical Impressions(s) / ED Diagnoses   Final diagnoses:  Dental abscess    ED Discharge Orders    None       Jaynie CrumbleKirichenko, Keilen Kahl, PA-C 03/12/17 1349    Mancel BaleWentz, Elliott, MD 03/12/17 1655

## 2017-03-12 NOTE — Discharge Instructions (Signed)
Rinse her mouth with Peridex twice a day.  Warm compresses.  Clindamycin as prescribed until gone for infection.  Return to emergency department if worsening symptoms.  Follow-up with your family doctor or oral surgeon/dentist.

## 2017-03-12 NOTE — ED Triage Notes (Signed)
Pt reports swelling in upper lip into nose that started "a couple of days ago" Area is hard.

## 2017-03-16 ENCOUNTER — Encounter: Payer: Self-pay | Admitting: Nurse Practitioner

## 2017-03-16 ENCOUNTER — Ambulatory Visit (INDEPENDENT_AMBULATORY_CARE_PROVIDER_SITE_OTHER): Payer: Medicare Other | Admitting: Nurse Practitioner

## 2017-03-16 VITALS — BP 160/98 | HR 84 | Temp 98.4°F | Resp 17 | Ht 66.0 in | Wt 215.0 lb

## 2017-03-16 DIAGNOSIS — K122 Cellulitis and abscess of mouth: Secondary | ICD-10-CM

## 2017-03-16 MED ORDER — HYDROXYZINE PAMOATE 25 MG PO CAPS: 25.0000 mg | ORAL_CAPSULE | Freq: Three times a day (TID) | ORAL | 0 refills | Status: AC | PRN

## 2017-03-16 NOTE — Progress Notes (Signed)
Careteam: Patient Care Team: Sharon SellerEubanks, Obie Silos K, NP as PCP - General (Nurse Practitioner)  Advanced Directive information Does Patient Have a Medical Advance Directive?: No, Would patient like information on creating a medical advance directive?: No - Patient declined  Allergies  Allergen Reactions  . Aspirin Hives  . Penicillins Hives    Chief Complaint  Patient presents with  . Acute Visit    Pt is being seen for a facial rash that started on day after beginning antibiotic and oral rinse for dental abcess. Pt reports rash appeared 4 days ago and itches.   . Medication Management    Pt is currently on clindamycin 150 mg and chlorhexidine 0.12 % oral rinse.   . Immunizations    Pt wants flu vaccine today.      HPI: Patient is a 56 y.o. female seen in the office today due to rash on face which started 1 day after going to the ED on 03/12/17. Went to the ED due to facial swelling and noted to have abscess of gum/upper lip and needle aspiration done with 4 cc of purulent drainage, prescribed clindamycin but has had an itchy rash on face since. The bumps have decreased but still have them on her chin and neck.  Whole face is itchy but benadryl is helping.     Review of Systems:  Review of Systems  Constitutional: Negative for chills and fever.  Respiratory: Negative for cough, shortness of breath and wheezing.   Cardiovascular: Negative for chest pain and palpitations.  Skin: Positive for itching and rash.  Neurological: Negative for weakness.    Past Medical History:  Diagnosis Date  . Acute bronchitis   . Chronic back pain   . Complication of anesthesia    "didn't wake up"  . Cough   . Insomnia, unspecified   . Other malaise and fatigue   . Other specified diseases of blood and blood-forming organs(289.89)   . Tobacco use disorder    Past Surgical History:  Procedure Laterality Date  . ESOPHAGOGASTRODUODENOSCOPY N/A 03/17/2013   Procedure:  ESOPHAGOGASTRODUODENOSCOPY (EGD);  Surgeon: Meryl DareMalcolm T Stark, MD;  [facility];  Service: Endoscopy;  Laterality: N/A;  . FRACTURE SURGERY    . TONSILLECTOMY     Social History:   reports that she has been smoking cigarettes.  She has a 6.00 pack-year smoking history. she has never used smokeless tobacco. She reports that she drinks about 2.4 oz of alcohol per week. She reports that she does not use drugs.  Family History  Problem Relation Age of Onset  . Arthritis Mother   . Hypertension Mother   . Hypertension Sister   . Hypertension Brother   . Hypertension Sister   . Hypertension Sister   . Hypertension Sister     Medications:   Medication List        Accurate as of 03/16/17 11:14 AM. Always use your most recent med list.          albuterol 108 (90 Base) MCG/ACT inhaler Commonly known as:  PROVENTIL HFA;VENTOLIN HFA Inhale 2 puffs into the lungs every 6 (six) hours as needed for wheezing. For shortness of breath   ALPRAZolam 0.5 MG tablet Commonly known as:  XANAX Take one tablet by mouth once daily as needed for anxiety   chlorhexidine 0.12 % solution Commonly known as:  PERIDEX Use as directed 15 mLs in the mouth or throat 2 (two) times daily.   clindamycin 150 MG capsule Commonly known  as:  CLEOCIN Take 2 capsules (300 mg total) by mouth every 6 (six) hours.   folic acid 1 MG tablet Commonly known as:  FOLVITE   oxyCODONE-acetaminophen 10-325 MG tablet Commonly known as:  PERCOCET Take 1 tablet every 6 (six) hours as needed by mouth for pain.        Physical Exam:  Vitals:   03/16/17 1052  BP: (!) 160/98  Pulse: 84  Resp: 17  Temp: 98.4 F (36.9 C)  TempSrc: Oral  SpO2: 96%  Weight: 215 lb (97.5 kg)  Height: 5\' 6"  (1.676 m)   Body mass index is 34.7 kg/m.  Physical Exam  Constitutional: She appears well-developed and well-nourished.  HENT:  Head: Normocephalic and atraumatic.  Nose: Nose normal.  Mouth/Throat: Oropharynx is  clear and moist. No oropharyngeal exudate.  Eyes: EOM are normal. Pupils are equal, round, and reactive to light.  Neck: Normal range of motion. Neck supple.  Cardiovascular: Normal rate and regular rhythm.  Pulmonary/Chest: Effort normal and breath sounds normal.   Labs reviewed: Basic Metabolic Panel: Recent Labs    09/21/16 1212  NA 137  K 4.1  CL 102  CO2 19*  GLUCOSE 93  BUN 7  CREATININE 0.47*  CALCIUM 9.2   Liver Function Tests: Recent Labs    09/21/16 1212  AST 27  ALT 13  ALKPHOS 110  BILITOT 0.5  PROT 7.5  ALBUMIN 4.2   No results for input(s): LIPASE, AMYLASE in the last 8760 hours. No results for input(s): AMMONIA in the last 8760 hours. CBC: Recent Labs    09/21/16 1212  WBC 3.1*  NEUTROABS 1,705  HGB 11.9  HCT 35.2  MCV 119.7*  PLT 295   Lipid Panel: Recent Labs    09/21/16 1212  CHOL 206*  HDL 106  LDLCALC 79  TRIG 829104  CHOLHDL 1.9   TSH: No results for input(s): TSH in the last 8760 hours. A1C: Lab Results  Component Value Date   HGBA1C 4.8 07/01/2015     Assessment/Plan 1. Infection of mouth [place] done with clindamycin and reports overall itching and rash has improved.  -discuss risk and benefits on staying on medication due to oral infection- PCN allergy.  -to stop taking medication if rash, itching becomes worse and notify, educated provided to go to ED if oral swelling occurs or trouble breathing.  - hydrOXYzine (VISTARIL) 25 MG capsule; Take 1 capsule (25 mg total) by mouth 3 (three) times daily as needed for up to 7 days for itching.  Dispense: 20 capsule; Refill: 0   Olumide Dolinger K. Biagio BorgEubanks, AGNP  [facility] [phone](Monday-Friday 8 am - 5 pm) [phone] (after hours)

## 2017-03-16 NOTE — Patient Instructions (Addendum)
To cont clindamycin  If rash gets worse or swelling occurs stop immediately and notify office To stop benadryl cream  May use as needed vistaril

## 2017-03-24 ENCOUNTER — Other Ambulatory Visit: Payer: Self-pay | Admitting: *Deleted

## 2017-03-24 MED ORDER — OXYCODONE-ACETAMINOPHEN 10-325 MG PO TABS: 1.0000 | ORAL_TABLET | Freq: Four times a day (QID) | ORAL | 0 refills | Status: DC | PRN

## 2017-03-24 MED ORDER — ALPRAZOLAM 0.5 MG PO TABS: ORAL_TABLET | ORAL | 0 refills | Status: DC

## 2017-03-24 NOTE — Telephone Encounter (Signed)
Patient requested and will pick up NCCSRS Database Verified.  

## 2017-04-23 ENCOUNTER — Other Ambulatory Visit: Payer: Self-pay | Admitting: *Deleted

## 2017-04-23 MED ORDER — ALPRAZOLAM 0.5 MG PO TABS: ORAL_TABLET | ORAL | 0 refills | Status: DC

## 2017-04-23 MED ORDER — OXYCODONE-ACETAMINOPHEN 10-325 MG PO TABS: 1.0000 | ORAL_TABLET | Freq: Four times a day (QID) | ORAL | 0 refills | Status: DC | PRN

## 2017-04-23 NOTE — Telephone Encounter (Signed)
Patient requested. NCCSRS Database Verified Pharmacy Confirmed Pended Rx and sent to Jessica for approval.  

## 2017-04-29 ENCOUNTER — Ambulatory Visit (INDEPENDENT_AMBULATORY_CARE_PROVIDER_SITE_OTHER): Payer: Medicare Other | Admitting: Nurse Practitioner

## 2017-04-29 ENCOUNTER — Encounter: Payer: Self-pay | Admitting: Nurse Practitioner

## 2017-04-29 VITALS — BP 134/88 | HR 68 | Temp 97.5°F | Resp 17 | Ht 66.0 in | Wt 196.0 lb

## 2017-04-29 DIAGNOSIS — E669 Obesity, unspecified: Secondary | ICD-10-CM

## 2017-04-29 DIAGNOSIS — I10 Essential (primary) hypertension: Secondary | ICD-10-CM

## 2017-04-29 DIAGNOSIS — F411 Generalized anxiety disorder: Secondary | ICD-10-CM | POA: Diagnosis not present

## 2017-04-29 DIAGNOSIS — E538 Deficiency of other specified B group vitamins: Secondary | ICD-10-CM | POA: Diagnosis not present

## 2017-04-29 DIAGNOSIS — Z720 Tobacco use: Secondary | ICD-10-CM

## 2017-04-29 DIAGNOSIS — R87619 Unspecified abnormal cytological findings in specimens from cervix uteri: Secondary | ICD-10-CM

## 2017-04-29 NOTE — Patient Instructions (Addendum)
Continue to keep up the good work on healthy diet and increase activity  To complete cologuard  To reschedule appt with GYN specialist  To continue to work reducing smoking with goal of stopping

## 2017-04-29 NOTE — Progress Notes (Signed)
Careteam: Patient Care Team: Sharon SellerEubanks, Fronia Depass K, NP as PCP - General (Nurse Practitioner)  Advanced Directive information Does Patient Have a Medical Advance Directive?: No  Allergies  Allergen Reactions  . Aspirin Hives  . Penicillins Hives    Chief Complaint  Patient presents with  . Medical Management of Chronic Issues    Pt is being seen for a 6 month routine visit. Pt has no concerns today.   . Medication Refill    no refills needed  . Immunizations    Pt wants flu vaccine today     HPI: Patient is a 57 y.o. female seen in the office today for routine follow up. Has cut back on smoking but still has not quit.  Cut back on drinking alcohol but has significantly cut back.  Was seen in November after gum abscess was removed and had rash- completed clindamycin without adverse effects.   Got FOBT in mail but would like to do cologuard   Had PAP in July which was abnormal, could not pay 50$ copay for specialist at that time but plans to make appt to follow up on this.   conts on folic acid- folate low in June.   Has been trying to lose weight, down from 215 to 196 with diet changes and increasing activity. Has cut out fried foods.   Anxiety- continues to use xanax almost every night.   Chronic back pain- uses oxycodone due to chronic back pain. No side effects. Only gets from our office and takes as prescribe.   Review of Systems:  Review of Systems  Constitutional: Negative for chills and fever.  HENT: Negative for hearing loss.   Eyes: Negative for blurred vision.  Respiratory: Negative for cough, shortness of breath and wheezing.   Cardiovascular: Negative for chest pain, palpitations and leg swelling.  Gastrointestinal: Negative for abdominal pain, blood in stool, constipation and melena.  Genitourinary: Negative for dysuria, frequency and urgency.  Musculoskeletal: Positive for back pain and joint pain. Negative for falls.  Skin: Negative for rash.    Neurological: Positive for tingling (when stands too much). Negative for dizziness and loss of consciousness.  Psychiatric/Behavioral: Negative for memory loss.    Past Medical History:  Diagnosis Date  . Acute bronchitis   . Chronic back pain   . Complication of anesthesia    "didn't wake up"  . Cough   . Insomnia, unspecified   . Other malaise and fatigue   . Other specified diseases of blood and blood-forming organs(289.89)   . Tobacco use disorder    Past Surgical History:  Procedure Laterality Date  . ESOPHAGOGASTRODUODENOSCOPY N/A 03/17/2013   Procedure: ESOPHAGOGASTRODUODENOSCOPY (EGD);  Surgeon: Meryl DareMalcolm T Stark, MD;  [facility];  Service: Endoscopy;  Laterality: N/A;  . FRACTURE SURGERY    . TONSILLECTOMY     Social History:   reports that she has been smoking cigarettes.  She has a 6.00 pack-year smoking history. she has never used smokeless tobacco. She reports that she drinks about 2.4 oz of alcohol per week. She reports that she does not use drugs.  Family History  Problem Relation Age of Onset  . Arthritis Mother   . Hypertension Mother   . Hypertension Sister   . Hypertension Brother   . Hypertension Sister   . Hypertension Sister   . Hypertension Sister     Medications: Patient's Medications  New Prescriptions   No medications on file  Previous Medications   ALBUTEROL (PROVENTIL HFA;VENTOLIN  HFA) 108 (90 BASE) MCG/ACT INHALER    Inhale 2 puffs into the lungs every 6 (six) hours as needed for wheezing. For shortness of breath   ALPRAZOLAM (XANAX) 0.5 MG TABLET    Take one tablet by mouth once daily as needed for anxiety   CHLORHEXIDINE (PERIDEX) 0.12 % SOLUTION    Use as directed 15 mLs in the mouth or throat 2 (two) times daily.   FOLIC ACID (FOLVITE) 1 MG TABLET    Take 1 mg by mouth daily.   OXYCODONE-ACETAMINOPHEN (PERCOCET) 10-325 MG TABLET    Take 1 tablet by mouth every 6 (six) hours as needed for pain.  Modified Medications   No  medications on file  Discontinued Medications   CLINDAMYCIN (CLEOCIN) 150 MG CAPSULE    Take 2 capsules (300 mg total) by mouth every 6 (six) hours.     Physical Exam:  Vitals:   04/29/17 1308  BP: 134/88  Pulse: 68  Resp: 17  Temp: (!) 97.5 F (36.4 C)  TempSrc: Oral  SpO2: 95%  Weight: 196 lb (88.9 kg)  Height: 5\' 6"  (1.676 m)   Body mass index is 31.64 kg/m.  Physical Exam  Constitutional: She is oriented to person, place, and time. She appears well-developed and well-nourished.  Smells of smoke  HENT:  Head: Normocephalic and atraumatic.  Eyes: EOM are normal. Pupils are equal, round, and reactive to light.  Neck: Normal range of motion. Neck supple.  Cardiovascular: Normal rate, regular rhythm and normal heart sounds.  Pulmonary/Chest: Effort normal and breath sounds normal.  Abdominal: Soft. Bowel sounds are normal. She exhibits no distension.  Musculoskeletal: She exhibits no edema.  Neurological: She is alert and oriented to person, place, and time.  Skin: Skin is warm and dry.  Psychiatric: She has a normal mood and affect.    Labs reviewed: Basic Metabolic Panel: Recent Labs    09/21/16 1212  NA 137  K 4.1  CL 102  CO2 19*  GLUCOSE 93  BUN 7  CREATININE 0.47*  CALCIUM 9.2   Liver Function Tests: Recent Labs    09/21/16 1212  AST 27  ALT 13  ALKPHOS 110  BILITOT 0.5  PROT 7.5  ALBUMIN 4.2   No results for input(s): LIPASE, AMYLASE in the last 8760 hours. No results for input(s): AMMONIA in the last 8760 hours. CBC: Recent Labs    09/21/16 1212  WBC 3.1*  NEUTROABS 1,705  HGB 11.9  HCT 35.2  MCV 119.7*  PLT 295   Lipid Panel: Recent Labs    09/21/16 1212  CHOL 206*  HDL 106  LDLCALC 79  TRIG 161  CHOLHDL 1.9   TSH: No results for input(s): TSH in the last 8760 hours. A1C: Lab Results  Component Value Date   HGBA1C 4.8 07/01/2015     Assessment/Plan 1. Essential hypertension -not currently on medication, cont  lifestyle modifications.   2. Tobacco abuse -encouraged cessation.  3. Folic acid deficiency -currently on supplement however low 7 months ago, will follow up today - CBC with Differential/Platelets - Folate  4. Obesity (BMI 30.0-34.9) -intentional weight loss with diet and exercise.  Encouraged to cont healthy food choices with increase in activity.   5. Generalized anxiety disorder -stable on xanax daily PRN  6. Abnormal PAP -placed referral for further evaluation however could not afford copay at that time however planning to reschedule; encouraged follow up with specialist.   Next appt: 6 months, sooner if needed Yvan Dority K.  Harle Battiest  [facility] [phone] 8 am - 5 pm) [phone] (after hours)

## 2017-04-30 LAB — CBC WITH DIFFERENTIAL/PLATELET
BASOS ABS: 80 {cells}/uL (ref 0–200)
Basophils Relative: 2 %
EOS ABS: 20 {cells}/uL (ref 15–500)
EOS PCT: 0.5 %
HCT: 36.2 % (ref 35.0–45.0)
HEMOGLOBIN: 13 g/dL (ref 11.7–15.5)
Lymphs Abs: 904 cells/uL (ref 850–3900)
MCH: 39.6 pg — AB (ref 27.0–33.0)
MCHC: 35.9 g/dL (ref 32.0–36.0)
MCV: 110.4 fL — ABNORMAL HIGH (ref 80.0–100.0)
MPV: 11.1 fL (ref 7.5–12.5)
Monocytes Relative: 9.7 %
NEUTROS ABS: 2608 {cells}/uL (ref 1500–7800)
NEUTROS PCT: 65.2 %
Platelets: 232 10*3/uL (ref 140–400)
RBC: 3.28 10*6/uL — ABNORMAL LOW (ref 3.80–5.10)
RDW: 16.6 % — AB (ref 11.0–15.0)
Total Lymphocyte: 22.6 %
WBC mixed population: 388 cells/uL (ref 200–950)
WBC: 4 10*3/uL (ref 3.8–10.8)

## 2017-04-30 LAB — FOLATE: Folate: 2.2 ng/mL — ABNORMAL LOW

## 2017-05-05 ENCOUNTER — Telehealth: Payer: Self-pay | Admitting: *Deleted

## 2017-05-05 NOTE — Telephone Encounter (Signed)
Noted  

## 2017-05-05 NOTE — Telephone Encounter (Signed)
Patient called and stated that this last Rx she received for Oxycodone is not effective. Stated it isn't working like usual. Wonders what to do. Doesn't want the Rx changed or anything else.  I instructed patient to call the pharmacy and make sure they didn't switch Manufacturer. If so she is going to tell them to make note she doesn't want to go with them for next Refill.  Patient agreed.

## 2017-05-24 ENCOUNTER — Other Ambulatory Visit: Payer: Self-pay | Admitting: *Deleted

## 2017-05-24 MED ORDER — ALPRAZOLAM 0.5 MG PO TABS: ORAL_TABLET | ORAL | 0 refills | Status: DC

## 2017-05-24 MED ORDER — OXYCODONE-ACETAMINOPHEN 10-325 MG PO TABS: 1.0000 | ORAL_TABLET | Freq: Four times a day (QID) | ORAL | 0 refills | Status: DC | PRN

## 2017-05-24 NOTE — Telephone Encounter (Signed)
Patient requested. NCCSRS Database Verified Pharmacy Confirmed Pended Rx and sent to Jessica for approval.  

## 2017-05-26 ENCOUNTER — Telehealth: Payer: Self-pay | Admitting: *Deleted

## 2017-05-26 NOTE — Telephone Encounter (Signed)
Did she have the green sinus drainage with the infected gum? This sounds like it could be a sinusitis based on green sinus infection. Would recommend office visit for further evaluation at this time.

## 2017-05-26 NOTE — Telephone Encounter (Signed)
Patient scheduled an appointment for tomorrow with [facility] to be seen.

## 2017-05-26 NOTE — Telephone Encounter (Signed)
Patient called and stated that the infection is coming back in her upper lip and wants an antibiotic called in. Stated that it is causing nasal congestion greenish in color. Not to the point she has to take the bridge out but wants to get it before it gets worse. No fever. Slight cough and side of face is starting to swell again. Please Advise.

## 2017-05-27 ENCOUNTER — Encounter: Payer: Self-pay | Admitting: Nurse Practitioner

## 2017-05-27 ENCOUNTER — Ambulatory Visit (INDEPENDENT_AMBULATORY_CARE_PROVIDER_SITE_OTHER): Payer: Medicare Other | Admitting: Nurse Practitioner

## 2017-05-27 VITALS — BP 128/82 | HR 92 | Temp 98.1°F | Ht 66.0 in | Wt 206.0 lb

## 2017-05-27 DIAGNOSIS — K05219 Aggressive periodontitis, localized, unspecified severity: Secondary | ICD-10-CM

## 2017-05-27 DIAGNOSIS — R05 Cough: Secondary | ICD-10-CM | POA: Diagnosis not present

## 2017-05-27 DIAGNOSIS — R059 Cough, unspecified: Secondary | ICD-10-CM

## 2017-05-27 MED ORDER — CLINDAMYCIN HCL 300 MG PO CAPS
300.0000 mg | ORAL_CAPSULE | Freq: Four times a day (QID) | ORAL | 0 refills | Status: DC
Start: 2017-05-27 — End: 2017-09-27

## 2017-05-27 MED ORDER — ALBUTEROL SULFATE HFA 108 (90 BASE) MCG/ACT IN AERS: 2.0000 | INHALATION_SPRAY | Freq: Four times a day (QID) | RESPIRATORY_TRACT | 3 refills | Status: DC | PRN

## 2017-05-27 MED ORDER — CHLORHEXIDINE GLUCONATE 0.12 % MT SOLN: 15.0000 mL | Freq: Two times a day (BID) | OROMUCOSAL | 0 refills | Status: DC

## 2017-05-27 NOTE — Progress Notes (Signed)
Careteam: Patient Care Team: Sharon Seller, NP as PCP - General (Nurse Practitioner)  Advanced Directive information    Allergies  Allergen Reactions  . Aspirin Hives  . Penicillins Hives    Chief Complaint  Patient presents with  . Acute Visit    Pt is being seen due to painful pocket in upper lip/nose area. Pt also has sinus congestion.      HPI: Patient is a 57 y.o. female seen in the office today pain in mouth in the same spot that she had the gum infection. Improved but never felt like it resolved. Feels like it is swelling when places tongue on area.  No fever Reports congestion in sinuses for 4 days. Thick Green nasal drainage out of left nare but swelling and pain on right.  No headaches.  Taking mucines sinus which does not help. Took benadryl which helped dry sinuses up but came right back.  Right side of face tender and swollen, pain was an 8/10 yesterday. 5/10 today.   Review of Systems:  Review of Systems  Constitutional: Negative for chills, fever and malaise/fatigue.  HENT: Positive for congestion and sinus pain. Negative for ear discharge, ear pain, hearing loss and sore throat.        Pain and swelling to right side of face above mouth  Eyes: Negative for blurred vision and double vision.  Respiratory: Negative for shortness of breath.   Cardiovascular: Negative for chest pain.  Skin: Negative for itching and rash.  Neurological: Negative for weakness.    Past Medical History:  Diagnosis Date  . Acute bronchitis   . Chronic back pain   . Complication of anesthesia    "didn't wake up"  . Cough   . Insomnia, unspecified   . Other malaise and fatigue   . Other specified diseases of blood and blood-forming organs(289.89)   . Tobacco use disorder    Past Surgical History:  Procedure Laterality Date  . ESOPHAGOGASTRODUODENOSCOPY N/A 03/17/2013   Procedure: ESOPHAGOGASTRODUODENOSCOPY (EGD);  Surgeon: Meryl Dare, MD;  Location[facility]  ENDOSCOPY;  Service: Endoscopy;  Laterality: N/A;  . FRACTURE SURGERY    . TONSILLECTOMY     Social History:   reports that she has been smoking cigarettes.  She has a 6.00 pack-year smoking history. she has never used smokeless tobacco. She reports that she drinks about 2.4 oz of alcohol per week. She reports that she does not use drugs.  Family History  Problem Relation Age of Onset  . Arthritis Mother   . Hypertension Mother   . Hypertension Sister   . Hypertension Brother   . Hypertension Sister   . Hypertension Sister   . Hypertension Sister     Medications: Patient's Medications  New Prescriptions   No medications on file  Previous Medications   ALBUTEROL (PROVENTIL HFA;VENTOLIN HFA) 108 (90 BASE) MCG/ACT INHALER    Inhale 2 puffs into the lungs every 6 (six) hours as needed for wheezing. For shortness of breath   ALPRAZOLAM (XANAX) 0.5 MG TABLET    Take one tablet by mouth once daily as needed for anxiety   CHLORHEXIDINE (PERIDEX) 0.12 % SOLUTION    Use as directed 15 mLs in the mouth or throat 2 (two) times daily.   FOLIC ACID (FOLVITE) 1 MG TABLET    Take 1 mg by mouth daily.   OXYCODONE-ACETAMINOPHEN (PERCOCET) 10-325 MG TABLET    Take 1 tablet by mouth every 6 (six) hours as needed for  pain.  Modified Medications   No medications on file  Discontinued Medications   No medications on file     Physical Exam:  Vitals:   05/27/17 1034  BP: 128/82  Pulse: 92  Temp: 98.1 F (36.7 C)  TempSrc: Oral  SpO2: 96%  Weight: 206 lb (93.4 kg)  Height: 5\' 6"  (1.676 m)   Body mass index is 33.25 kg/m.  Physical Exam  Constitutional: She appears well-developed and well-nourished.  HENT:  Head: Normocephalic and atraumatic.  Right Ear: External ear normal.  Left Ear: External ear normal.  Nose: Nose normal.  Mouth/Throat: Oropharynx is clear and moist. No oropharyngeal exudate.  Ulceration noted to right upper gum where gum line meets inside of lip, no drainage  noted Edema noted to right side of face  Eyes: Conjunctivae and EOM are normal. Pupils are equal, round, and reactive to light.  Neck: Normal range of motion. Neck supple.  Cardiovascular: Normal rate, regular rhythm and normal heart sounds.  Pulmonary/Chest: Effort normal and breath sounds normal.  Abdominal: Soft. Bowel sounds are normal.    Labs reviewed: Basic Metabolic Panel: Recent Labs    09/21/16 1212  NA 137  K 4.1  CL 102  CO2 19*  GLUCOSE 93  BUN 7  CREATININE 0.47*  CALCIUM 9.2   Liver Function Tests: Recent Labs    09/21/16 1212  AST 27  ALT 13  ALKPHOS 110  BILITOT 0.5  PROT 7.5  ALBUMIN 4.2   No results for input(s): LIPASE, AMYLASE in the last 8760 hours. No results for input(s): AMMONIA in the last 8760 hours. CBC: Recent Labs    09/21/16 1212 04/29/17 1409  WBC 3.1* 4.0  NEUTROABS 1,705 2,608  HGB 11.9 13.0  HCT 35.2 36.2  MCV 119.7* 110.4*  PLT 295 232   Lipid Panel: Recent Labs    09/21/16 1212  CHOL 206*  HDL 106  LDLCALC 79  TRIG 784104  CHOLHDL 1.9   TSH: No results for input(s): TSH in the last 8760 hours. A1C: Lab Results  Component Value Date   HGBA1C 4.8 07/01/2015     Assessment/Plan 1. Cough -refill provided albuterol (PROVENTIL HFA;VENTOLIN HFA) 108 (90 Base) MCG/ACT inhaler; Inhale 2 puffs into the lungs every 6 (six) hours as needed for wheezing. For shortness of breath  Dispense: 3 Inhaler; Refill: 3  2. Gingival abscess -recurrent, Stat  Ambulatory referral to ENT - clindamycin (CLEOCIN) 300 MG capsule; Take 1 capsule (300 mg total) by mouth every 6 (six) hours.  Dispense: 28 capsule; Refill: 0 - chlorhexidine (PERIDEX) 0.12 % solution; Use as directed 15 mLs in the mouth or throat 2 (two) times daily.  Dispense: 120 mL; Refill: 0  Venita Seng K. Biagio BorgEubanks, AGNP  [facility] [phone](Monday-Friday 8 am - 5 pm) [phone] (after hours)

## 2017-05-27 NOTE — Patient Instructions (Addendum)
To use clindamycin by mouth every 6 hours To use mouth wash twice daily   To go to with ENT

## 2017-06-21 ENCOUNTER — Other Ambulatory Visit: Payer: Self-pay | Admitting: *Deleted

## 2017-06-21 MED ORDER — ALPRAZOLAM 0.5 MG PO TABS: ORAL_TABLET | ORAL | 0 refills | Status: DC

## 2017-06-21 MED ORDER — OXYCODONE-ACETAMINOPHEN 10-325 MG PO TABS: 1.0000 | ORAL_TABLET | Freq: Four times a day (QID) | ORAL | 0 refills | Status: DC | PRN

## 2017-06-21 NOTE — Telephone Encounter (Signed)
Patient called and requested refill NCCSRS Database Verified Pharmacy Confirmed Pended Rx [facility]

## 2017-06-30 ENCOUNTER — Telehealth: Payer: Self-pay

## 2017-06-30 NOTE — Telephone Encounter (Signed)
I left a message for patient to call the office to discuss whether or not he plans to complete cologuard. A letter was received from [facility] [employer] [facility] stating that patient has not sent the kit back.

## 2017-07-05 NOTE — Telephone Encounter (Signed)
I spoke with patient and he still plans to send in the cologuard kit. Patient will notify office when it is sent in.

## 2017-07-22 ENCOUNTER — Other Ambulatory Visit: Payer: Self-pay | Admitting: *Deleted

## 2017-07-22 MED ORDER — ALPRAZOLAM 0.5 MG PO TABS: ORAL_TABLET | ORAL | 0 refills | Status: DC

## 2017-07-22 MED ORDER — OXYCODONE-ACETAMINOPHEN 10-325 MG PO TABS: 1.0000 | ORAL_TABLET | Freq: Four times a day (QID) | ORAL | 0 refills | Status: DC | PRN

## 2017-07-22 NOTE — Telephone Encounter (Signed)
Patient requested refill NCCSRS Database Verified LR: 06/21/17 Pharmacy Conf[facility].

## 2017-08-10 ENCOUNTER — Encounter: Payer: Self-pay | Admitting: Nurse Practitioner

## 2017-08-10 ENCOUNTER — Ambulatory Visit (INDEPENDENT_AMBULATORY_CARE_PROVIDER_SITE_OTHER): Payer: Medicare Other | Admitting: Nurse Practitioner

## 2017-08-10 VITALS — BP 124/80 | HR 98 | Temp 98.3°F | Ht 66.0 in | Wt 216.6 lb

## 2017-08-10 DIAGNOSIS — R6889 Other general symptoms and signs: Secondary | ICD-10-CM

## 2017-08-10 DIAGNOSIS — F17200 Nicotine dependence, unspecified, uncomplicated: Secondary | ICD-10-CM | POA: Diagnosis not present

## 2017-08-10 DIAGNOSIS — R05 Cough: Secondary | ICD-10-CM | POA: Diagnosis not present

## 2017-08-10 DIAGNOSIS — R059 Cough, unspecified: Secondary | ICD-10-CM

## 2017-08-10 LAB — CBC WITH DIFFERENTIAL/PLATELET
BASOS PCT: 1.6 %
Basophils Absolute: 69 cells/uL (ref 0–200)
EOS ABS: 9 {cells}/uL — AB (ref 15–500)
EOS PCT: 0.2 %
HCT: 30.2 % — ABNORMAL LOW (ref 35.0–45.0)
HEMOGLOBIN: 10.9 g/dL — AB (ref 11.7–15.5)
Lymphs Abs: 585 cells/uL — ABNORMAL LOW (ref 850–3900)
MCH: 39.6 pg — AB (ref 27.0–33.0)
MCHC: 36.1 g/dL — ABNORMAL HIGH (ref 32.0–36.0)
MCV: 109.8 fL — ABNORMAL HIGH (ref 80.0–100.0)
MONOS PCT: 5.3 %
MPV: 11.1 fL (ref 7.5–12.5)
NEUTROS ABS: 3410 {cells}/uL (ref 1500–7800)
Neutrophils Relative %: 79.3 %
Platelets: 159 10*3/uL (ref 140–400)
RBC: 2.75 10*6/uL — AB (ref 3.80–5.10)
RDW: 15 % (ref 11.0–15.0)
Total Lymphocyte: 13.6 %
WBC mixed population: 228 cells/uL (ref 200–950)
WBC: 4.3 10*3/uL (ref 3.8–10.8)

## 2017-08-10 LAB — COMPLETE METABOLIC PANEL WITH GFR
AG Ratio: 1.4 (calc) (ref 1.0–2.5)
ALT: 23 U/L (ref 6–29)
Albumin: 4.1 g/dL (ref 3.6–5.1)
Alkaline phosphatase (APISO): 124 U/L (ref 33–130)
BUN/Creatinine Ratio: 19 (calc) (ref 6–22)
BUN: 9 mg/dL (ref 7–25)
CALCIUM: 9 mg/dL (ref 8.6–10.4)
CO2: 31 mmol/L (ref 20–32)
Chloride: 95 mmol/L — ABNORMAL LOW (ref 98–110)
Creat: 0.48 mg/dL — ABNORMAL LOW (ref 0.50–1.05)
GFR, EST AFRICAN AMERICAN: 129 mL/min/{1.73_m2} (ref >=60)
GFR, Est Non African American: 111 mL/min/{1.73_m2} (ref >=60)
GLUCOSE: 85 mg/dL (ref 65–139)
Globulin: 3 g/dL (calc) (ref 1.9–3.7)
Sodium: 135 mmol/L (ref 135–146)
TOTAL PROTEIN: 7.1 g/dL (ref 6.1–8.1)

## 2017-08-10 LAB — TSH: TSH: 3.93 mIU/L

## 2017-08-10 NOTE — Progress Notes (Signed)
Careteam: Patient Care Team: Sharon Seller, NP as PCP - General (Nurse Practitioner)  Advanced Directive information    Allergies  Allergen Reactions  . Aspirin Hives  . Penicillins Hives    Chief Complaint  Patient presents with  . Acute Visit    Pt c/o pain on L side of her back onset 2 wks ago only when lying down  . Medication Refill    No Refills     HPI: Patient is a 57 y.o. female seen in the office today due to left lower back being hot when laying on left side x 2 weeks.  NO pain. No injury. No numbness or tingling. No itching or skin changes.  No fevers. Has had night sweats for years which happen once sleeping.  Had some increase congestion when weather was changing but now back to baseline. Cough and congestion at baseline. No changes in anxiety or depression No new medications No changes in diet.  No changes in exercise.   Overall feels well.   Review of Systems:  Review of Systems  Constitutional: Negative for chills, fever and malaise/fatigue.  Respiratory: Positive for cough and sputum production. Negative for shortness of breath.   Cardiovascular: Negative for chest pain and leg swelling.  Musculoskeletal: Positive for back pain (no changes with pain) and myalgias.  Psychiatric/Behavioral: Negative for depression and hallucinations. The patient is not nervous/anxious and does not have insomnia.    Past Medical History:  Diagnosis Date  . Acute bronchitis   . Chronic back pain   . Complication of anesthesia    "didn't wake up"  . Cough   . Insomnia, unspecified   . Other malaise and fatigue   . Other specified diseases of blood and blood-forming organs(289.89)   . Tobacco use disorder    Past Surgical History:  Procedure Laterality Date  . ESOPHAGOGASTRODUODENOSCOPY N/A 03/17/2013   Procedure: ESOPHAGOGASTRODUODENOSCOPY (EGD);  Surgeon: Meryl Dare, MD;  Location[facility] ENDOSCOPY;  Service: Endoscopy;  Laterality: N/A;  . FRACTURE  SURGERY    . TONSILLECTOMY     Social History:   reports that she has been smoking cigarettes.  She has a 6.00 pack-year smoking history. She has never used smokeless tobacco. She reports that she drinks about 2.4 oz of alcohol per week. She reports that she does not use drugs.  Family History  Problem Relation Age of Onset  . Arthritis Mother   . Hypertension Mother   . Hypertension Sister   . Hypertension Brother   . Hypertension Sister   . Hypertension Sister   . Hypertension Sister     Medications: Patient's Medications  New Prescriptions   No medications on file  Previous Medications   ALBUTEROL (PROVENTIL HFA;VENTOLIN HFA) 108 (90 BASE) MCG/ACT INHALER    Inhale 2 puffs into the lungs every 6 (six) hours as needed for wheezing. For shortness of breath   ALPRAZOLAM (XANAX) 0.5 MG TABLET    Take one tablet by mouth once daily as needed for anxiety   CHLORHEXIDINE (PERIDEX) 0.12 % SOLUTION    Use as directed 15 mLs in the mouth or throat 2 (two) times daily.   CLINDAMYCIN (CLEOCIN) 300 MG CAPSULE    Take 1 capsule (300 mg total) by mouth every 6 (six) hours.   FOLIC ACID (FOLVITE) 1 MG TABLET    Take 1 mg by mouth daily.   OXYCODONE-ACETAMINOPHEN (PERCOCET) 10-325 MG TABLET    Take 1 tablet by mouth every 6 (six)  hours as needed for pain.  Modified Medications   No medications on file  Discontinued Medications   No medications on file     Physical Exam:  Vitals:   08/10/17 1524  BP: 124/80  Pulse: 98  Temp: 98.3 F (36.8 C)  TempSrc: Oral  SpO2: 95%  Weight: 216 lb 9.6 oz (98.2 kg)  Height: 5\' 6"  (1.676 m)   Body mass index is 34.96 kg/m.  Physical Exam  Constitutional: She appears well-developed and well-nourished.  HENT:  Head: Normocephalic and atraumatic.  Cardiovascular: Normal rate, regular rhythm and normal heart sounds.  Pulmonary/Chest: Effort normal. She has rhonchi in the left upper field, the left middle field and the left lower field.    Neurological: She is alert.  Skin: Skin is warm and dry. No rash noted. No erythema. No pallor.  No skin abnormalities or warmth to touch noted to left lower back  Psychiatric: She has a normal mood and affect.   Labs reviewed: Basic Metabolic Panel: Recent Labs    09/21/16 1212  NA 137  K 4.1  CL 102  CO2 19*  GLUCOSE 93  BUN 7  CREATININE 0.47*  CALCIUM 9.2   Liver Function Tests: Recent Labs    09/21/16 1212  AST 27  ALT 13  ALKPHOS 110  BILITOT 0.5  PROT 7.5  ALBUMIN 4.2   No results for input(s): LIPASE, AMYLASE in the last 8760 hours. No results for input(s): AMMONIA in the last 8760 hours. CBC: Recent Labs    09/21/16 1212 04/29/17 1409  WBC 3.1* 4.0  NEUTROABS 1,705 2,608  HGB 11.9 13.0  HCT 35.2 36.2  MCV 119.7* 110.4*  PLT 295 232   Lipid Panel: Recent Labs    09/21/16 1212  CHOL 206*  HDL 106  LDLCALC 79  TRIG 161104  CHOLHDL 1.9   TSH: No results for input(s): TSH in the last 8760 hours. A1C: Lab Results  Component Value Date   HGBA1C 4.8 07/01/2015     Assessment/Plan 1. Cough -increase cough and congestion with allergies but overall has improved however with abnormal lung sounds on the left with hx of smoking  - DG Chest 2 View  2. Smoker - DG Chest 2 View  3. Sensation of feeling hot -no abnormalities noted on exam today, follow upprecautions given  - COMPLETE METABOLIC PANEL WITH GFR - TSH - CBC with Differential/Platelets  Next appt: 09/27/2017 Janene HarveyJessica K. Biagio BorgEubanks, AGNP  [facility] [phone]

## 2017-08-10 NOTE — Patient Instructions (Signed)
To go to [facility] for chest xray this week when you are able

## 2017-08-11 ENCOUNTER — Other Ambulatory Visit: Payer: Self-pay

## 2017-08-11 DIAGNOSIS — D539 Nutritional anemia, unspecified: Secondary | ICD-10-CM

## 2017-08-11 LAB — TEST AUTHORIZATION

## 2017-08-11 LAB — B12 AND FOLATE PANEL
FOLATE: 2 ng/mL — AB
VITAMIN B 12: 459 pg/mL (ref 200–1100)

## 2017-08-12 ENCOUNTER — Other Ambulatory Visit: Payer: Self-pay | Admitting: Nurse Practitioner

## 2017-08-13 ENCOUNTER — Ambulatory Visit
Admission: RE | Admit: 2017-08-13 | Discharge: 2017-08-13 | Disposition: A | Discharge status: Home / Self Care | Payer: Medicare Other | Source: Ambulatory Visit | Attending: Nurse Practitioner | Admitting: Nurse Practitioner

## 2017-08-13 ENCOUNTER — Other Ambulatory Visit: Payer: Self-pay

## 2017-08-13 DIAGNOSIS — D539 Nutritional anemia, unspecified: Secondary | ICD-10-CM

## 2017-08-13 IMAGING — DX DG CHEST 2V
2 series · 2 of 2 positions shown · non-contrast
Comparison: PA and lateral chest [DATE] and [DATE].

CLINICAL DATA: Cough and congestion for 1 month in a smoker.

EXAM:
CHEST - 2 VIEW

[dg chest 2 view (1 of 2)]
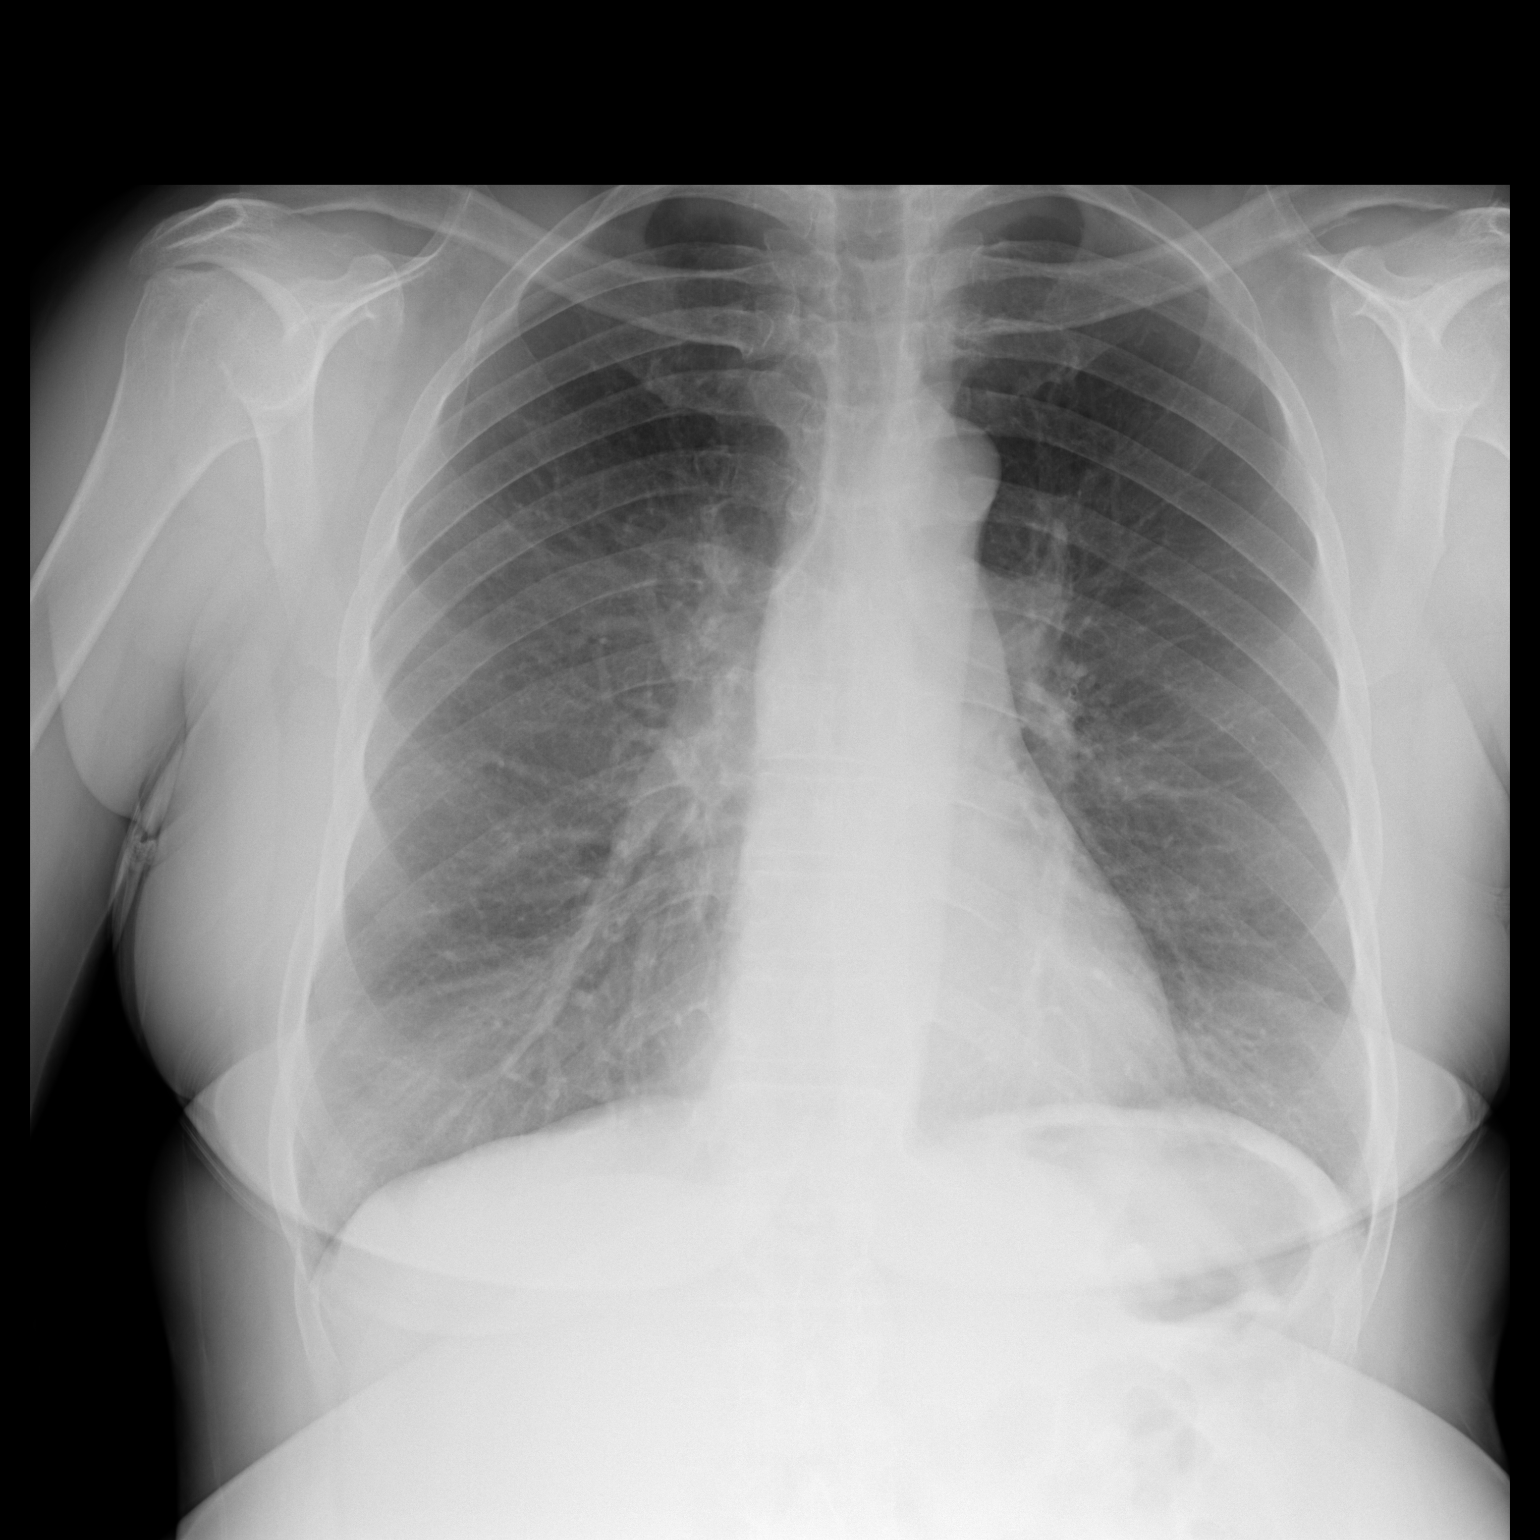

[dg chest 2 view (2 of 2)]
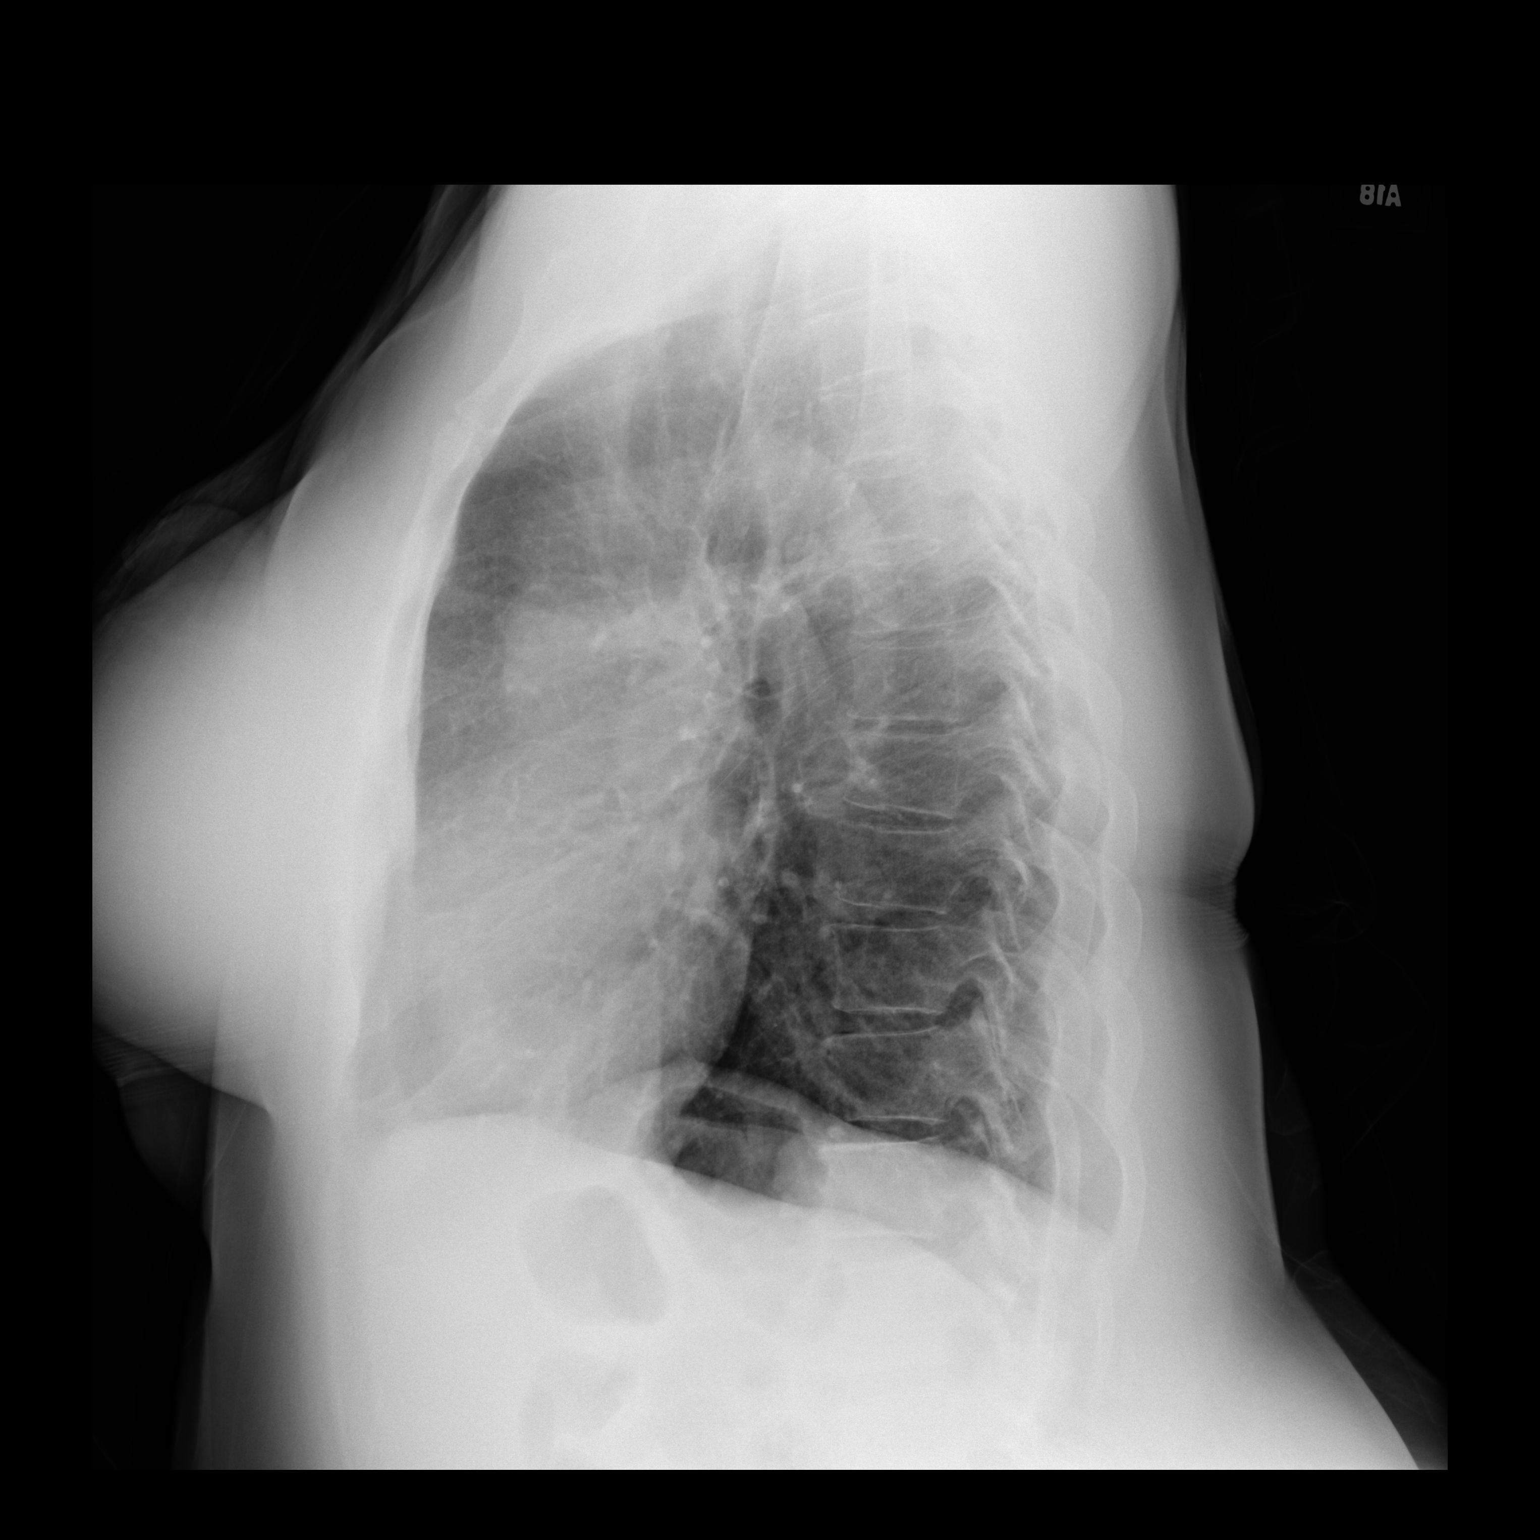

[2 of 2 positions shown; findings below may reference images not displayed]

FINDINGS: Lungs are clear. Heart size is normal. Aortic atherosclerosis is
noted. No pneumothorax or pleural effusion. No acute bony
abnormality.
IMPRESSION: No acute disease.

Atherosclerosis.

## 2017-08-13 MED ORDER — FOLIC ACID 1 MG PO TABS: 2.0000 mg | ORAL_TABLET | Freq: Every day | ORAL | 1 refills | Status: DC

## 2017-08-13 NOTE — Telephone Encounter (Signed)
Patient called to request a prescription be sent in to the pharmacy for folic acid so that it can be ran on insurance. Rx was sent to pharmacy electronically.

## 2017-08-14 LAB — UNLABELED: Test Ordered On Req: 11290

## 2017-08-17 LAB — FECAL GLOBIN BY IMMUNOCHEMISTRY
FECAL GLOBIN RESULT: NOT DETECTED
MICRO NUMBER:: 90514421
SPECIMEN QUALITY: ADEQUATE

## 2017-08-17 LAB — PAT ID TIQ DOC: Test Affected: 11290

## 2017-08-20 ENCOUNTER — Other Ambulatory Visit: Payer: Self-pay | Admitting: *Deleted

## 2017-08-20 LAB — COLOGUARD: Cologuard: NEGATIVE

## 2017-08-20 MED ORDER — ALPRAZOLAM 0.5 MG PO TABS: ORAL_TABLET | ORAL | 0 refills | Status: DC

## 2017-08-20 MED ORDER — OXYCODONE-ACETAMINOPHEN 10-325 MG PO TABS: 1.0000 | ORAL_TABLET | Freq: Four times a day (QID) | ORAL | 0 refills | Status: DC | PRN

## 2017-08-20 NOTE — Telephone Encounter (Signed)
Patient called and requested medication refill NCCSRS Database Verified LR: 07/22/17 Pharmacy Confirmed Pended Rx and sent to [facility] for approval.

## 2017-08-23 ENCOUNTER — Other Ambulatory Visit: Payer: Self-pay | Admitting: Nurse Practitioner

## 2017-08-23 ENCOUNTER — Telehealth: Payer: Self-pay | Admitting: Nurse Practitioner

## 2017-08-23 DIAGNOSIS — D539 Nutritional anemia, unspecified: Secondary | ICD-10-CM

## 2017-08-23 DIAGNOSIS — E669 Obesity, unspecified: Secondary | ICD-10-CM

## 2017-08-23 NOTE — Telephone Encounter (Signed)
I spoke with the pt to schedule AWV-S.  She wanted me to tell you that she sent in the cologuard.   I also reminded the pt of her 5/23 lab appt.  She didn't think she was due for labs because she just had them.  However, she is willing to due them at the 09/27/17 appt if needed. VDM (DD)

## 2017-08-23 NOTE — Telephone Encounter (Signed)
Okay, I am also going to add fasting lipids because she needs that as well

## 2017-09-07 ENCOUNTER — Encounter: Payer: Self-pay | Admitting: *Deleted

## 2017-09-08 ENCOUNTER — Other Ambulatory Visit: Payer: Self-pay

## 2017-09-08 DIAGNOSIS — E669 Obesity, unspecified: Secondary | ICD-10-CM

## 2017-09-09 ENCOUNTER — Other Ambulatory Visit: Payer: Self-pay

## 2017-09-20 ENCOUNTER — Other Ambulatory Visit: Payer: Self-pay | Admitting: *Deleted

## 2017-09-20 MED ORDER — OXYCODONE-ACETAMINOPHEN 10-325 MG PO TABS: 1.0000 | ORAL_TABLET | Freq: Four times a day (QID) | ORAL | 0 refills | Status: DC | PRN

## 2017-09-20 MED ORDER — ALPRAZOLAM 0.5 MG PO TABS: ORAL_TABLET | ORAL | 0 refills | Status: DC

## 2017-09-20 NOTE — Telephone Encounter (Signed)
Patient requested NCCSRS Database Verified LR: 08/20/2017 [facility]

## 2017-09-27 ENCOUNTER — Ambulatory Visit (INDEPENDENT_AMBULATORY_CARE_PROVIDER_SITE_OTHER): Payer: Medicare Other

## 2017-09-27 ENCOUNTER — Ambulatory Visit: Payer: Medicare Other | Admitting: Nurse Practitioner

## 2017-09-27 ENCOUNTER — Other Ambulatory Visit: Payer: Medicare Other

## 2017-09-27 ENCOUNTER — Ambulatory Visit (INDEPENDENT_AMBULATORY_CARE_PROVIDER_SITE_OTHER): Payer: Medicare Other | Admitting: Nurse Practitioner

## 2017-09-27 ENCOUNTER — Encounter: Payer: Self-pay | Admitting: Nurse Practitioner

## 2017-09-27 VITALS — BP 142/84 | HR 88 | Temp 98.1°F | Ht 66.0 in | Wt 213.0 lb

## 2017-09-27 DIAGNOSIS — M5441 Lumbago with sciatica, right side: Secondary | ICD-10-CM

## 2017-09-27 DIAGNOSIS — Z6834 Body mass index (BMI) 34.0-34.9, adult: Secondary | ICD-10-CM

## 2017-09-27 DIAGNOSIS — Z7189 Other specified counseling: Secondary | ICD-10-CM

## 2017-09-27 DIAGNOSIS — F17200 Nicotine dependence, unspecified, uncomplicated: Secondary | ICD-10-CM

## 2017-09-27 DIAGNOSIS — Z9189 Other specified personal risk factors, not elsewhere classified: Secondary | ICD-10-CM | POA: Diagnosis not present

## 2017-09-27 DIAGNOSIS — G8929 Other chronic pain: Secondary | ICD-10-CM | POA: Diagnosis not present

## 2017-09-27 DIAGNOSIS — E669 Obesity, unspecified: Secondary | ICD-10-CM

## 2017-09-27 DIAGNOSIS — E538 Deficiency of other specified B group vitamins: Secondary | ICD-10-CM | POA: Diagnosis not present

## 2017-09-27 DIAGNOSIS — D539 Nutritional anemia, unspecified: Secondary | ICD-10-CM | POA: Diagnosis not present

## 2017-09-27 DIAGNOSIS — I10 Essential (primary) hypertension: Secondary | ICD-10-CM | POA: Diagnosis not present

## 2017-09-27 DIAGNOSIS — Z0000 Encounter for general adult medical examination without abnormal findings: Secondary | ICD-10-CM

## 2017-09-27 DIAGNOSIS — K05219 Aggressive periodontitis, localized, unspecified severity: Secondary | ICD-10-CM | POA: Diagnosis not present

## 2017-09-27 DIAGNOSIS — R87619 Unspecified abnormal cytological findings in specimens from cervix uteri: Secondary | ICD-10-CM

## 2017-09-27 MED ORDER — ZOSTER VAC RECOMB ADJUVANTED 50 MCG/0.5ML IM SUSR: 0.5000 mL | Freq: Once | INTRAMUSCULAR | 1 refills | Status: AC

## 2017-09-27 NOTE — Progress Notes (Signed)
Careteam: Patient Care Team: Lauree Chandler, NP as PCP - General (Nurse Practitioner)  Advanced Directive information Does Patient Have a Medical Advance Directive?: No  Allergies  Allergen Reactions  . Aspirin Hives  . Penicillins Hives    Chief Complaint  Patient presents with  . Medical Management of Chronic Issues    Pt is being seen for a 5 month routine visit.   . ACP    Needed     HPI: Patient is a 57 y.o. female seen in the office today for routine follow up.   Pt has been followed by ENT for gingival abscess, requested biopsy which was done, came back benign per pt. Taking bridge out at night which has helped.  No ongoing infection.   Insomnia- uses xanax at bedtime to help sleep due to anxiety.   Tobacco use-  Continues to smoke cigarettes. No desire to quit smoking   Chronic back pain- stable on percocet, using ever 6 hours as needed.  No side effects noted from percocet, has occasional itching.  No longer smoking mariajuana due to use of percocet.   Anemia- noted to be low in folate, has hx ETOH abuse- drinking 1-2 beers a week at this time. Has cut back.  Has increase folic acid twice daily   Pt with abnormal PAP in July 2018, spoke with her over the phone and at the time was agreeable for GYN referral, referral was placed and she cancelled appt because she did not have money for co-pay then she forgot to reschedule.   htn- blood pressure at home 120-130/70s. Never over 140 sbp. Also smoked cigarette before she came into appt   Des not have advance directives/HCAP.   Review of Systems:  Review of Systems  Constitutional: Negative for chills and fever.  HENT: Negative for hearing loss.   Eyes: Negative for blurred vision.  Respiratory: Positive for cough (chronic unchanged). Negative for shortness of breath and wheezing.   Cardiovascular: Negative for chest pain, palpitations and leg swelling.  Gastrointestinal: Negative for abdominal pain,  blood in stool, constipation and melena.  Genitourinary: Negative for dysuria, frequency and urgency.  Musculoskeletal: Positive for back pain and joint pain. Negative for falls.  Skin: Negative for rash.  Neurological: Positive for tingling (right, unchanged). Negative for dizziness and loss of consciousness.  Psychiatric/Behavioral: Negative for memory loss.    Past Medical History:  Diagnosis Date  . Acute bronchitis   . Chronic back pain   . Complication of anesthesia    "didn't wake up"  . Cough   . Insomnia, unspecified   . Other malaise and fatigue   . Other specified diseases of blood and blood-forming organs(289.89)   . Tobacco use disorder    Past Surgical History:  Procedure Laterality Date  . ESOPHAGOGASTRODUODENOSCOPY N/A 03/17/2013   Procedure: ESOPHAGOGASTRODUODENOSCOPY (EGD);  Surgeon: Ladene Artist, MD;  Location: [facility] ENDOSCOPY;  Service: Endoscopy;  Laterality: N/A;  . FRACTURE SURGERY    . TONSILLECTOMY     Social History:   reports that she has been smoking cigarettes.  She has a 6.00 pack-year smoking history. She has never used smokeless tobacco. She reports that she drinks about 2.4 oz of alcohol per week. She reports that she does not use drugs.  Family History  Problem Relation Age of Onset  . Arthritis Mother   . Hypertension Mother   . Hypertension Sister   . Hypertension Brother   . Hypertension Sister   . Hypertension  Sister   . Hypertension Sister     Medications: Patient's Medications  New Prescriptions   No medications on file  Previous Medications   ALBUTEROL (PROVENTIL HFA;VENTOLIN HFA) 108 (90 BASE) MCG/ACT INHALER    Inhale 2 puffs into the lungs every 6 (six) hours as needed for wheezing. For shortness of breath   ALPRAZOLAM (XANAX) 0.5 MG TABLET    Take one tablet by mouth once daily as needed for anxiety   CHLORHEXIDINE (PERIDEX) 0.12 % SOLUTION    Use as directed 15 mLs in the mouth or throat 2 (two) times daily.   FOLIC ACID  (FOLVITE) 1 MG TABLET    Take 2 tablets (2 mg total) by mouth daily.   OXYCODONE-ACETAMINOPHEN (PERCOCET) 10-325 MG TABLET    Take 1 tablet by mouth every 6 (six) hours as needed for pain.  Modified Medications   No medications on file  Discontinued Medications   CLINDAMYCIN (CLEOCIN) 300 MG CAPSULE    Take 1 capsule (300 mg total) by mouth every 6 (six) hours.     Physical Exam:  Vitals:   09/27/17 1144  BP: (!) 142/84  Pulse: 88  Temp: 98.1 F (36.7 C)  TempSrc: Oral  SpO2: 95%  Weight: 213 lb (96.6 kg)  Height: _0  (1.676 m)   Body mass index is 34.38 kg/m.  Physical Exam  Constitutional: She is oriented to person, place, and time. She appears well-developed and well-nourished.  HENT:  Head: Normocephalic and atraumatic.  Eyes: Pupils are equal, round, and reactive to light. EOM are normal.  Neck: Normal range of motion. Neck supple.  Cardiovascular: Normal rate, regular rhythm and normal heart sounds.  Pulmonary/Chest: Effort normal and breath sounds normal.  Abdominal: Soft. Bowel sounds are normal. She exhibits no distension.  Musculoskeletal: She exhibits no edema.  Neurological: She is alert and oriented to person, place, and time.  Skin: Skin is warm and dry.  Psychiatric: She has a normal mood and affect.    Labs reviewed: Basic Metabolic Panel: Recent Labs    08/10/17 1554  NA 135  K CANCELED  CL 95*  CO2 31  GLUCOSE 85  BUN 9  CREATININE 0.48*  CALCIUM 9.0  TSH 3.93   Liver Function Tests: Recent Labs    08/10/17 1554  AST CANCELED  ALT 23  BILITOT CANCELED  PROT 7.1   No results for input(s): LIPASE, AMYLASE in the last 8760 hours. No results for input(s): AMMONIA in the last 8760 hours. CBC: Recent Labs    04/29/17 1409 08/10/17 1554  WBC 4.0 4.3  NEUTROABS 2,608 3,410  HGB 13.0 10.9*  HCT 36.2 30.2*  MCV 110.4* 109.8*  PLT 232 159   Lipid Panel: No results for input(s): CHOL, HDL, LDLCALC, TRIG, CHOLHDL, LDLDIRECT in the  last 8760 hours. TSH: Recent Labs    08/10/17 1554  TSH 3.93   A1C: Lab Results  Component Value Date   HGBA1C 4.8 07/01/2015     Assessment/Plan 1. Abnormal cervical Papanicolaou smear, unspecified abnormal pap finding -did not reschedule cancelled appt, will send in another referral at this time.  - Ambulatory referral to Gynecology  2. Macrocytic anemia -will follow up CBC today  3. Smoker -not interested in cessation at this time. Encouraged cessation  4. Gingival abscess -resolved. No longer following with ENT at this time.  5. Folic acid deficiency Has increased folic acid to twice daily, will follow up lab today  6. Essential hypertension Elevated today, reports smoking  before coming for appt. Discussed dietary modifications. Will continue to monitor.    7. At risk for adverse drug reaction -discuss use of ETOH with prescribed medication, based on the bedside opiod-overdose calculator pt at 55% risk of overdose due to substance use disorder, she has stopped smoking marijuana per report.   8. Advanced care planning/counseling discussion Given handout for advance care planning and HCPOA, pt without questions at this time.   9. Body mass index (BMI) of 34.0-34.9 in adult Noted today  10. Obesity (BMI 30.0-34.9) Unable to exercise due to chronic back pain. Handout given for low fat and cholesterol diet.   11. Chronic right-sided low back pain with right-sided sciatica -continues on oxycodone, advised NOT to drink alcohol while taking medications.  - Drug Tox Monitor 1 w/Conf, Oral Fld - Pain Mgmt, Alcohol Met. w/Conf, U   Next appt: 6 months with Dr Sharee Holster K. [place], [facility] [phone]

## 2017-09-27 NOTE — Patient Instructions (Addendum)
Laura [name] , Thank you for taking time to come for your Medicare Wellness Visit. I appreciate your ongoing commitment to your health goals. Please review the following plan we discussed and let me know if I can assist you in the future.   Screening recommendations/referrals: Colonoscopy/Cologuard due 08/20/2020 Mammogram due, declined Bone Density due at age 57 Recommended yearly ophthalmology/optometry visit for glaucoma screening and checkup Recommended yearly dental visit for hygiene and checkup  Vaccinations: Influenza vaccine due 2019 fall season Pneumococcal vaccine up to date. Due at age 56 Tdap vaccine up to date, due 10/06/2025 Shingles vaccine due, ordered to pharmacy  Advanced directives: Advance directive discussed with you today. I have provided a copy for you to complete at home and have notarized. Once this is complete please bring a copy in to our office so we can scan it into your chart.  Conditions/risks identified: none  Next appointment: Tyson Dense, RN 09/30/2018 @ 12:45pm  Preventive Care 40-64 Years, Female Preventive care refers to lifestyle choices and visits with your health care provider that can promote health and wellness. What does preventive care include?  A yearly physical exam. This is also called an annual well check.  Dental exams once or twice a year.  Routine eye exams. Ask your health care provider how often you should have your eyes checked.  Personal lifestyle choices, including:  Daily care of your teeth and gums.  Regular physical activity.  Eating a healthy diet.  Avoiding tobacco and drug use.  Limiting alcohol use.  Practicing safe sex.  Taking low-dose aspirin daily starting at age 29.  Taking vitamin and mineral supplements as recommended by your health care provider. What happens during an annual well check? The services and screenings done by your health care provider during your annual well check will depend on your age,  overall health, lifestyle risk factors, and family history of disease. Counseling  Your health care provider may ask you questions about your:  Alcohol use.  Tobacco use.  Drug use.  Emotional well-being.  Home and relationship well-being.  Sexual activity.  Eating habits.  Work and work Statistician.  Method of birth control.  Menstrual cycle.  Pregnancy history. Screening  You may have the following tests or measurements:  Height, weight, and BMI.  Blood pressure.  Lipid and cholesterol levels. These may be checked every 5 years, or more frequently if you are over 74 years old.  Skin check.  Lung cancer screening. You may have this screening every year starting at age 81 if you have a 30-pack-year history of smoking and currently smoke or have quit within the past 15 years.  Fecal occult blood test (FOBT) of the stool. You may have this test every year starting at age 16.  Flexible sigmoidoscopy or colonoscopy. You may have a sigmoidoscopy every 5 years or a colonoscopy every 10 years starting at age 28.  Hepatitis C blood test.  Hepatitis B blood test.  Sexually transmitted disease (STD) testing.  Diabetes screening. This is done by checking your blood sugar (glucose) after you have not eaten for a while (fasting). You may have this done every 1-3 years.  Mammogram. This may be done every 1-2 years. Talk to your health care provider about when you should start having regular mammograms. This may depend on whether you have a family history of breast cancer.  BRCA-related cancer screening. This may be done if you have a family history of breast, ovarian, tubal, or peritoneal cancers.  Pelvic exam and Pap test. This may be done every 3 years starting at age 21. Starting at age 56, this may be done every 5 years if you have a Pap test in combination with an HPV test.  Bone density scan. This is done to screen for osteoporosis. You may have this scan if you are at  high risk for osteoporosis. Discuss your test results, treatment options, and if necessary, the need for more tests with your health care provider. Vaccines  Your health care provider may recommend certain vaccines, such as:  Influenza vaccine. This is recommended every year.  Tetanus, diphtheria, and acellular pertussis (Tdap, Td) vaccine. You may need a Td booster every 10 years.  Zoster vaccine. You may need this after age 73.  Pneumococcal 13-valent conjugate (PCV13) vaccine. You may need this if you have certain conditions and were not previously vaccinated.  Pneumococcal polysaccharide (PPSV23) vaccine. You may need one or two doses if you smoke cigarettes or if you have certain conditions. Talk to your health care provider about which screenings and vaccines you need and how often you need them. This information is not intended to replace advice given to you by your health care provider. Make sure you discuss any questions you have with your health care provider. Document Released: 05/03/2015 Document Revised: 12/25/2015 Document Reviewed: 02/05/2015 Elsevier Interactive Patient Education  2017 [employer] Prevention in the Home Falls can cause injuries. They can happen to people of all ages. There are many things you can do to make your home safe and to help prevent falls. What can I do on the outside of my home?  Regularly fix the edges of walkways and driveways and fix any cracks.  Remove anything that might make you trip as you walk through a door, such as a raised step or threshold.  Trim any bushes or trees on the path to your home.  Use bright outdoor lighting.  Clear any walking paths of anything that might make someone trip, such as rocks or tools.  Regularly check to see if handrails are loose or broken. Make sure that both sides of any steps have handrails.  Any raised decks and porches should have guardrails on the edges.  Have any leaves, snow, or  ice cleared regularly.  Use sand or salt on walking paths during winter.  Clean up any spills in your garage right away. This includes oil or grease spills. What can I do in the bathroom?  Use night lights.  Install grab bars by the toilet and in the tub and shower. Do not use towel bars as grab bars.  Use non-skid mats or decals in the tub or shower.  If you need to sit down in the shower, use a plastic, non-slip stool.  Keep the floor dry. Clean up any water that spills on the floor as soon as it happens.  Remove soap buildup in the tub or shower regularly.  Attach bath mats securely with double-sided non-slip rug tape.  Do not have throw rugs and other things on the floor that can make you trip. What can I do in the bedroom?  Use night lights.  Make sure that you have a light by your bed that is easy to reach.  Do not use any sheets or blankets that are too big for your bed. They should not hang down onto the floor.  Have a firm chair that has side arms. You can use this for  support while you get dressed.  Do not have throw rugs and other things on the floor that can make you trip. What can I do in the kitchen?  Clean up any spills right away.  Avoid walking on wet floors.  Keep items that you use a lot in easy-to-reach places.  If you need to reach something above you, use a strong step stool that has a grab bar.  Keep electrical cords out of the way.  Do not use floor polish or wax that makes floors slippery. If you must use wax, use non-skid floor wax.  Do not have throw rugs and other things on the floor that can make you trip. What can I do with my stairs?  Do not leave any items on the stairs.  Make sure that there are handrails on both sides of the stairs and use them. Fix handrails that are broken or loose. Make sure that handrails are as long as the stairways.  Check any carpeting to make sure that it is firmly attached to the stairs. Fix any carpet  that is loose or worn.  Avoid having throw rugs at the top or bottom of the stairs. If you do have throw rugs, attach them to the floor with carpet tape.  Make sure that you have a light switch at the top of the stairs and the bottom of the stairs. If you do not have them, ask someone to add them for you. What else can I do to help prevent falls?  Wear shoes that:  Do not have high heels.  Have rubber bottoms.  Are comfortable and fit you well.  Are closed at the toe. Do not wear sandals.  If you use a stepladder:  Make sure that it is fully opened. Do not climb a closed stepladder.  Make sure that both sides of the stepladder are locked into place.  Ask someone to hold it for you, if possible.  Clearly mark and make sure that you can see:  Any grab bars or handrails.  First and last steps.  Where the edge of each step is.  Use tools that help you move around (mobility aids) if they are needed. These include:  Canes.  Walkers.  Scooters.  Crutches.  Turn on the lights when you go into a dark area. Replace any light bulbs as soon as they burn out.  Set up your furniture so you have a clear path. Avoid moving your furniture around.  If any of your floors are uneven, fix them.  If there are any pets around you, be aware of where they are.  Review your medicines with your doctor. Some medicines can make you feel dizzy. This can increase your chance of falling. Ask your doctor what other things that you can do to help prevent falls. This information is not intended to replace advice given to you by your health care provider. Make sure you discuss any questions you have with your health care provider. Document Released: 01/31/2009 Document Revised: 09/12/2015 Document Reviewed: 05/11/2014 Elsevier Interactive Patient Education  2017 [employer].

## 2017-09-27 NOTE — ACP (Advance Care Planning) (Signed)
Information provided for advanced directive and HC POA. Pt reports she will look over documents.

## 2017-09-27 NOTE — Patient Instructions (Addendum)
Referral placed for GYN   Lab today    Fat and Cholesterol Restricted Diet Getting too much fat and cholesterol in your diet may cause health problems. Following this diet helps keep your fat and cholesterol at normal levels. This can keep you from getting sick. What types of fat should I choose?  Choose monosaturated and polyunsaturated fats. These are found in foods such as olive oil, canola oil, flaxseeds, walnuts, almonds, and seeds.  Eat more omega-3 fats. Good choices include salmon, mackerel, sardines, tuna, flaxseed oil, and ground flaxseeds.  Limit saturated fats. These are in animal products such as meats, butter, and cream. They can also be in plant products such as palm oil, palm kernel oil, and coconut oil.  Avoid foods with partially hydrogenated oils in them. These contain trans fats. Examples of foods that have trans fats are stick margarine, some tub margarines, cookies, crackers, and other baked goods. What general guidelines do I need to follow?  Check food labels. Look for the words "trans fat" and "saturated fat."  When preparing a meal: ? Fill half of your plate with vegetables and green salads. ? Fill one fourth of your plate with whole grains. Look for the word "whole" as the first word in the ingredient list. ? Fill one fourth of your plate with lean protein foods.  Eat more foods that have fiber, like apples, carrots, beans, peas, and barley.  Eat more home-cooked foods. Eat less at restaurants and buffets.  Limit or avoid alcohol.  Limit foods high in starch and sugar.  Limit fried foods.  Cook foods without frying them. Baking, boiling, grilling, and broiling are all great options.  Lose weight if you are overweight. Losing even a small amount of weight can help your overall health. It can also help prevent diseases such as diabetes and heart disease. What foods can I eat? Grains Whole grains, such as whole wheat or whole grain breads, crackers,  cereals, and pasta. Unsweetened oatmeal, bulgur, barley, quinoa, or brown rice. Corn or whole wheat flour tortillas. Vegetables Fresh or frozen vegetables (raw, steamed, roasted, or grilled). Green salads. Fruits All fresh, canned (in natural juice), or frozen fruits. Meat and Other Protein Products Ground beef (85% or leaner), grass-fed beef, or beef trimmed of fat. Skinless chicken or [place]. Ground chicken or [place]. Pork trimmed of fat. All fish and seafood. Eggs. Dried beans, peas, or lentils. Unsalted nuts or seeds. Unsalted canned or dry beans. Dairy Low-fat dairy products, such as skim or 1% milk, 2% or reduced-fat cheeses, low-fat ricotta or cottage cheese, or plain low-fat yogurt. Fats and Oils Tub margarines without trans fats. Light or reduced-fat mayonnaise and salad dressings. Avocado. Olive, canola, sesame, or safflower oils. Natural peanut or almond butter (choose ones without added sugar and oil). The items listed above may not be a complete list of recommended foods or beverages. Contact your dietitian for more options. What foods are not recommended? Grains White bread. White pasta. White rice. Cornbread. Bagels, pastries, and croissants. Crackers that contain trans fat. Vegetables White potatoes. Corn. Creamed or fried vegetables. Vegetables in a cheese sauce. Fruits Dried fruits. Canned fruit in light or heavy syrup. Fruit juice. Meat and Other Protein Products Fatty cuts of meat. Ribs, chicken wings, bacon, sausage, bologna, salami, chitterlings, fatback, hot dogs, bratwurst, and packaged luncheon meats. Liver and organ meats. Dairy Whole or 2% milk, cream, half-and-half, and cream cheese. Whole milk cheeses. Whole-fat or sweetened yogurt. Full-fat cheeses. Nondairy creamers and whipped toppings.  Processed cheese, cheese spreads, or cheese curds. Sweets and Desserts Corn syrup, sugars, honey, and molasses. Candy. Jam and jelly. Syrup. Sweetened cereals. Cookies, pies,  cakes, donuts, muffins, and ice cream. Fats and Oils Butter, stick margarine, lard, shortening, ghee, or bacon fat. Coconut, palm kernel, or palm oils. Beverages Alcohol. Sweetened drinks (such as sodas, lemonade, and fruit drinks or punches). The items listed above may not be a complete list of foods and beverages to avoid. Contact your dietitian for more information. This information is not intended to replace advice given to you by your health care provider. Make sure you discuss any questions you have with your health care provider. Document Released: 10/06/2011 Document Revised: 12/12/2015 Document Reviewed: 07/06/2013 Elsevier Interactive Patient Education  [employer]2018 Elsevier Inc.

## 2017-09-27 NOTE — Progress Notes (Signed)
Subjective:   Laura [name] is a 57 y.o. female who presents for Medicare Annual (Subsequent) preventive examination.  Last AWV-09/21/2016       Objective:     Vitals: BP (!) 142/84 (BP Location: Right Arm, Patient Position: Sitting)   Pulse 88   Temp 98.1 F (36.7 C) (Oral)   Ht 5\' 6"  (1.676 m)   Wt 213 lb (96.6 kg)   BMI 34.38 kg/m   Body mass index is 34.38 kg/m.  Advanced Directives 09/27/2017 09/27/2017 04/29/2017 03/16/2017 03/12/2017 10/26/2016 09/21/2016  Does Patient Have a Medical Advance Directive? No No No No No No No  Would patient like information on creating a medical advance directive? Yes (MAU/Ambulatory/Procedural Areas - Information given) - - No - Patient declined - - No - Patient declined  Pre-existing out of facility DNR order (yellow form or pink MOST form) - - - - - - -    Tobacco Social History   Tobacco Use  Smoking Status Current Every Day Smoker  . Packs/day: 0.50  . Years: 12.00  . Pack years: 6.00  . Types: Cigarettes  Smokeless Tobacco Never Used     Ready to quit: Not Answered Counseling given: Not Answered   Clinical Intake:  Pre-visit preparation completed: No  Pain : 0-10 Pain Score: 5  Pain Type: Chronic pain Pain Location: Back Pain Orientation: Lower Pain Descriptors / Indicators: Aching Pain Onset: More than a month ago Pain Frequency: Intermittent     Nutritional Risks: None Diabetes: No  How often do you need to have someone help you when you read instructions, pamphlets, or other written materials from your doctor or pharmacy?: 1 - Never What is the last grade level you completed in school?: Some college  Interpreter Needed?: No  Information entered by :: Tyron Russell, RN  Past Medical History:  Diagnosis Date  . Acute bronchitis   . Chronic back pain   . Complication of anesthesia    "didn't wake up"  . Cough   . Insomnia, unspecified   . Other malaise and fatigue   . Other specified diseases of blood and  blood-forming organs(289.89)   . Tobacco use disorder    Past Surgical History:  Procedure Laterality Date  . ESOPHAGOGASTRODUODENOSCOPY N/A 03/17/2013   Procedure: ESOPHAGOGASTRODUODENOSCOPY (EGD);  Surgeon: Meryl Dare, MD;  Location: [facility];  Service: Endoscopy;  Laterality: N/A;  . FRACTURE SURGERY    . TONSILLECTOMY     Family History  Problem Relation Age of Onset  . Arthritis Mother   . Hypertension Mother   . Hypertension Sister   . Hypertension Brother   . Hypertension Sister   . Hypertension Sister   . Hypertension Sister    Social History   Socioeconomic History  . Marital status: Significant Other    Spouse name: Not on file  . Number of children: Not on file  . Years of education: Not on file  . Highest education level: Not on file  Occupational History  . Not on file  Social Needs  . Financial resource strain: Not hard at all  . Food insecurity:    Worry: Never true    Inability: Never true  . Transportation needs:    Medical: No    Non-medical: No  Tobacco Use  . Smoking status: Current Every Day Smoker    Packs/day: 0.50    Years: 12.00    Pack years: 6.00    Types: Cigarettes  . Smokeless tobacco: Never Used  Substance and Sexual Activity  . Alcohol use: Yes    Alcohol/week: 1.2 oz    Types: 2 Cans of beer per week    Comment: ocassional  . Drug use: No  . Sexual activity: Yes  Lifestyle  . Physical activity:    Days per week: 2 days    Minutes per session: 10 min  . Stress: Not at all  Relationships  . Social connections:    Talks on phone: More than three times a week    Gets together: More than three times a week    Attends religious service: Never    Active member of club or organization: No    Attends meetings of clubs or organizations: Never    Relationship status: Never married  Other Topics Concern  . Not on file  Social History Narrative   Single   Smokes 1/2 ppd   Alcohol -2 beers or liquid occasionally    Exercise none    Walks with cane   No Advance Directives    Outpatient Encounter Medications as of 09/27/2017  Medication Sig  . albuterol (PROVENTIL HFA;VENTOLIN HFA) 108 (90 Base) MCG/ACT inhaler Inhale 2 puffs into the lungs every 6 (six) hours as needed for wheezing. For shortness of breath  . ALPRAZolam (XANAX) 0.5 MG tablet Take one tablet by mouth once daily as needed for anxiety  . chlorhexidine (PERIDEX) 0.12 % solution Use as directed 15 mLs in the mouth or throat 2 (two) times daily.  . folic acid (FOLVITE) 1 MG tablet Take 2 tablets (2 mg total) by mouth daily.  Marland Kitchen. oxyCODONE-acetaminophen (PERCOCET) 10-325 MG tablet Take 1 tablet by mouth every 6 (six) hours as needed for pain.   No facility-administered encounter medications on file as of 09/27/2017.     Activities of Daily Living In your present state of health, do you have any difficulty performing the following activities: 09/27/2017  Hearing? N  Vision? N  Difficulty concentrating or making decisions? N  Walking or climbing stairs? Y  Dressing or bathing? N  Doing errands, shopping? N  Preparing Food and eating ? N  Using the Toilet? N  In the past six months, have you accidently leaked urine? N  Do you have problems with loss of bowel control? N  Managing your Medications? N  Managing your Finances? N  Housekeeping or managing your Housekeeping? N  Some recent data might be hidden    Patient Care Team: Sharon SellerEubanks, Jessica K, NP as PCP - General (Nurse Practitioner)    Assessment:   This is a routine wellness examination for Laura [name].  Exercise Activities and Dietary recommendations Current Exercise Habits: Home exercise routine, Type of exercise: Other - see comments(biking), Time (Minutes): 10, Frequency (Times/Week): 2, Weekly Exercise (Minutes/Week): 20, Intensity: Mild, Exercise limited by: None identified  Goals    None      Fall Risk Fall Risk  09/27/2017 09/27/2017 08/10/2017 05/27/2017 04/29/2017  Falls in  the past year? Yes Yes No No No  Number falls in past yr: 1 1 - - -  Injury with Fall? No No - - -   Is the patient's home free of loose throw rugs in walkways, pet beds, electrical cords, etc?   yes      Grab bars in the bathroom? no      Handrails on the stairs?   yes      Adequate lighting?   yes  Timed Get Up and Go performed: 10 seconds  Depression  Screen PHQ 2/9 Scores 09/27/2017 09/21/2016 03/23/2016 03/25/2015  PHQ - 2 Score 0 0 0 0     Cognitive Function within normal limits        Immunization History  Administered Date(s) Administered  . Influenza Whole 05/16/2012  . Influenza,inj,Quad PF,6+ Mos 03/02/2013, 04/19/2014, 02/22/2015, 03/23/2016  . Pneumococcal Conjugate-13 02/22/2015  . Pneumococcal Polysaccharide-23 03/02/2013  . Tdap 10/07/2015    Qualifies for Shingles Vaccine? Yes, educated and ordered to pharmacy  Screening Tests Health Maintenance  Topic Date Due  . Hepatitis C Screening  03/16/2018 (Originally 03/23/1961)  . HIV Screening  03/16/2018 (Originally 09/08/1977)  . MAMMOGRAM  10/18/2018 (Originally 09/08/2012)  . COLONOSCOPY  05/20/2020 (Originally 09/08/2012)  . INFLUENZA VACCINE  11/18/2017  . PAP SMEAR  10/27/2019  . TETANUS/TDAP  10/06/2025    Cancer Screenings: Lung: Low Dose CT Chest recommended if Age 19-80 years, 30 pack-year currently smoking OR have quit w/in 15years. Patient does not qualify. Breast:  Up to date on Mammogram? No , declined  Up to date of Bone Density/Dexa? Yes Colorectal: up to date  Additional Screenings:  Hepatitis C Screening: declined     Plan:    I have personally reviewed and addressed the Medicare Annual Wellness questionnaire and have noted the following in the patient's chart:  A. Medical and social history B. Use of alcohol, tobacco or illicit drugs  C. Current medications and supplements D. Functional ability and status E.  Nutritional status F.  Physical activity G. Advance directives H. List of  other physicians I.  Hospitalizations, surgeries, and ER visits in previous 12 months J.  Vitals K. Screenings to include hearing, vision, cognitive, depression L. Referrals and appointments - none  In addition, I have reviewed and discussed with patient certain preventive protocols, quality metrics, and best practice recommendations. A written personalized care plan for preventive services as well as general preventive health recommendations were provided to patient.  See attached scanned questionnaire for additional information.   Signed,   Tyron Russell, RN Nurse Health Advisor  Patient Concerns: None

## 2017-09-28 LAB — CBC WITH DIFFERENTIAL/PLATELET
BASOS PCT: 2.5 %
Basophils Absolute: 98 cells/uL (ref 0–200)
EOS ABS: 70 {cells}/uL (ref 15–500)
Eosinophils Relative: 1.8 %
HCT: 37 % (ref 35.0–45.0)
HEMOGLOBIN: 13.2 g/dL (ref 11.7–15.5)
Lymphs Abs: 1509 cells/uL (ref 850–3900)
MCH: 37.6 pg — AB (ref 27.0–33.0)
MCHC: 35.7 g/dL (ref 32.0–36.0)
MCV: 105.4 fL — ABNORMAL HIGH (ref 80.0–100.0)
MPV: 10.6 fL (ref 7.5–12.5)
Monocytes Relative: 6.1 %
NEUTROS ABS: 1985 {cells}/uL (ref 1500–7800)
Neutrophils Relative %: 50.9 %
Platelets: 186 10*3/uL (ref 140–400)
RBC: 3.51 10*6/uL — AB (ref 3.80–5.10)
RDW: 13.9 % (ref 11.0–15.0)
Total Lymphocyte: 38.7 %
WBC: 3.9 10*3/uL (ref 3.8–10.8)
WBCMIX: 238 {cells}/uL (ref 200–950)

## 2017-09-28 LAB — FOLATE: Folate: 2.5 ng/mL — ABNORMAL LOW

## 2017-09-30 ENCOUNTER — Telehealth: Payer: Self-pay

## 2017-09-30 DIAGNOSIS — E66811 Obesity, class 1: Secondary | ICD-10-CM

## 2017-09-30 DIAGNOSIS — E538 Deficiency of other specified B group vitamins: Secondary | ICD-10-CM

## 2017-09-30 DIAGNOSIS — E669 Obesity, unspecified: Secondary | ICD-10-CM

## 2017-09-30 DIAGNOSIS — D539 Nutritional anemia, unspecified: Secondary | ICD-10-CM

## 2017-09-30 DIAGNOSIS — I10 Essential (primary) hypertension: Secondary | ICD-10-CM

## 2017-09-30 LAB — DRUG TOX MONITOR 1 W/CONF, ORAL FLD
ALPRAZOLAM: 8.05 ng/mL — AB (ref <0.50)
AMPHETAMINES: NEGATIVE ng/mL (ref <10)
BARBITURATES: NEGATIVE ng/mL (ref <10)
BENZODIAZEPINES: POSITIVE ng/mL — AB (ref <0.50)
Buprenorphine: NEGATIVE ng/mL (ref <0.10)
CHLORDIAZEPOXIDE: NEGATIVE ng/mL (ref <0.50)
COCAINE: NEGATIVE ng/mL (ref <5.0)
CODEINE: NEGATIVE ng/mL (ref <2.5)
Clonazepam: NEGATIVE ng/mL (ref <0.50)
Cotinine: >250 ng/mL — ABNORMAL HIGH (ref <5.0)
DIHYDROCODEINE: NEGATIVE ng/mL (ref <2.5)
Diazepam: NEGATIVE ng/mL (ref <0.50)
FENTANYL: NEGATIVE ng/mL (ref <0.10)
FLURAZEPAM: NEGATIVE ng/mL (ref <0.50)
Flunitrazepam: NEGATIVE ng/mL (ref <0.50)
HYDROMORPHONE: NEGATIVE ng/mL (ref <2.5)
Heroin Metabolite: NEGATIVE ng/mL (ref <1.0)
Hydrocodone: NEGATIVE ng/mL (ref <2.5)
LORAZEPAM: NEGATIVE ng/mL (ref <0.50)
MARIJUANA: NEGATIVE ng/mL (ref <2.5)
MDMA: NEGATIVE ng/mL (ref <10)
Meprobamate: NEGATIVE ng/mL (ref <2.5)
Methadone: NEGATIVE ng/mL (ref <5.0)
Midazolam: NEGATIVE ng/mL (ref <0.50)
Morphine: NEGATIVE ng/mL (ref <2.5)
Nicotine Metabolite: POSITIVE ng/mL — AB (ref <5.0)
Nordiazepam: NEGATIVE ng/mL (ref <0.50)
Norhydrocodone: NEGATIVE ng/mL (ref <2.5)
Noroxycodone: 23.2 ng/mL — ABNORMAL HIGH (ref <2.5)
Opiates: POSITIVE ng/mL — AB (ref <2.5)
Oxazepam: NEGATIVE ng/mL (ref <0.50)
Oxymorphone: 3.4 ng/mL — ABNORMAL HIGH (ref <2.5)
Phencyclidine: NEGATIVE ng/mL (ref <10)
TRAMADOL: NEGATIVE ng/mL (ref <5.0)
Tapentadol: NEGATIVE ng/mL (ref <5.0)
Temazepam: NEGATIVE ng/mL (ref <0.50)
Triazolam: NEGATIVE ng/mL (ref <0.50)
Zolpidem: NEGATIVE ng/mL (ref <5.0)

## 2017-09-30 LAB — DRUG TOX ALC METAB W/CON, ORAL FLD
Alcohol Metabolite: POSITIVE ng/mL — AB (ref <25)
ETHYL SULFATE: 198 ng/mL — AB (ref <25)

## 2017-09-30 MED ORDER — DULOXETINE HCL 30 MG PO CPEP: 30.0000 mg | ORAL_CAPSULE | Freq: Every day | ORAL | 1 refills | Status: DC

## 2017-09-30 NOTE — Telephone Encounter (Signed)
Patient is currently out of town but needs to schedule the following appointments when he returns. Patient is aware.   Pt needs to schedule the following appointments.   6 month appointment with Dr. Renato Gailseed in December.   Fasting lab appointment prior to appt with Dr. Renato Gailseed ([facility])   1 month follow up for medication changes with Dr. Montez Moritaarter or Dr. Renato Gailseed  (AFTER STARTING THE CYMBALTA) should be sometime in July if possible.

## 2017-09-30 NOTE — Telephone Encounter (Signed)
Yes also can place referral to [facility] if needed for resources for addiction medicine to help stop drinking alcohol

## 2017-09-30 NOTE — Telephone Encounter (Signed)
I spoke with patient and he stated that he had been drinking at 2 different graduation parties 2 days prior to having the drug/alcohol test done. He stated that he informed [facility] that he had been drinking at the parties but he had slowed down considerably on the drinking. He stated that does not agree with the medication changes because he cannot just stop drinking all at once, the DTs will kill him. Patient was very upset when he was informed that if he did not stop drinking then the oxycodone would not be prescribed by [facility] in the future. He stated that [facility] is trying to kill him by making him stop drinking all at once, he is trying to stop but it will take time.   Patient is currently out of town dealing with family issues and he is unsure of when he will return but he will schedule all follow up appointments and labs once he returns.   Medication list has been updated and rx for cymbalta 30 mg was sent to pharmacy.

## 2017-09-30 NOTE — Telephone Encounter (Signed)
-----   Message from Sharon SellerJessica K Eubanks, NP sent at 09/30/2017  8:51 AM EDT ----- Drug screen showing alcohol in system along with prescribed medications. Pt with folate def with the alcohol is contributing to. Due to ongoing alcohol use we will need to titrate xanax and change to another medication to help with sleep due to anxiety. Lets have him start Cymbalta 30 mg by mouth daily (#30/1 refill)  to help with anxiety, also should help with pain. Can start while weaning off alprazolam, At this time would recommend cutting alprazolam tablet in half to 0.25 dosing for 1 week every other day for 1 week and then stop, will not be providing more refill. Will need to follow up in office in 1 month to see how Cymbalta is working.

## 2017-09-30 NOTE — Telephone Encounter (Signed)
Would not recommend quitting drinking all at once if he is drinking that much (based on encounters in office he acted like he had cut back but based on lab work and this report it does not seem like that is the case) Would recommend a outpatient substance abuse program to help with weaning from alcohol. He would have to contact them to set this up. Can place a [facility] referral to help with resources though if he is interested.

## 2017-09-30 NOTE — Telephone Encounter (Signed)
Notes recorded by Sharon SellerEubanks, Jessica K, NP on 09/30/2017 at 8:53 AM EDT Also need to stop drinking, this is causing medical problems (folate def) and mixed with other medications can cause resp depression and death as discussed in detail at office visit. We will not be able to continue to prescribed oxycodone if he continues to consume alcohol to this effect.  Notes recorded by Sharon SellerEubanks, Jessica K, NP on 09/28/2017 at 3:07 PM EDT hgb has improved. Folate still low but improving. To continue folic acid twice daily and to stop drinking alcohol which is contributing to the deficiency. Needs 6 month follow up with Dr Renato Gailseed, lets follow up CBC, folate, fasting lipid panel and CMP BEFORE appt with Renato Gailseed

## 2017-09-30 NOTE — Telephone Encounter (Signed)
Called patient but there was no answer and no voicemail picked up. Will try again later

## 2017-10-08 NOTE — Telephone Encounter (Signed)
Called patient but no answer and no voicemail. Will try again later.

## 2017-10-08 NOTE — Telephone Encounter (Signed)
Called patient but no answer and no voicemail. Will try again later.  

## 2017-10-11 NOTE — Telephone Encounter (Signed)
Stated that he is not going to take the Cymbalta. Stated that you are over reacting. Stated that the results show excessive amount of alcohol due to [employer] Party and this is not the norm.

## 2017-10-11 NOTE — Telephone Encounter (Signed)
Well it is not safe to be consuming alcohol with xanax due to risk of respiratory depression and death. (pt reported he could not abruptly stop alcohol due to withdrawal yet then reports he does not drink alcohol often) We will not provide refill after this prescription.

## 2017-10-20 ENCOUNTER — Other Ambulatory Visit: Payer: Self-pay | Admitting: *Deleted

## 2017-10-20 MED ORDER — OXYCODONE-ACETAMINOPHEN 10-325 MG PO TABS: 1.0000 | ORAL_TABLET | Freq: Four times a day (QID) | ORAL | 0 refills | Status: DC | PRN

## 2017-10-20 NOTE — Telephone Encounter (Signed)
Message left advising patient.  

## 2017-10-20 NOTE — Telephone Encounter (Signed)
Ok to refill? NCCSRS shows [facility]/2017 [facility]. Also requested RF of Alprazolam 0.5 Mg Tablet. Not on current med list. [facility]

## 2017-10-20 NOTE — Telephone Encounter (Signed)
No longer prescribing alprazolam, recommended for Laura NeedleMichael to start cymbalta and wean off alprazolam (see phone notes in epic)

## 2017-11-04 ENCOUNTER — Ambulatory Visit: Payer: Medicare Other | Admitting: Gynecology

## 2017-11-19 ENCOUNTER — Other Ambulatory Visit: Payer: Self-pay | Admitting: *Deleted

## 2017-11-19 MED ORDER — OXYCODONE-ACETAMINOPHEN 10-325 MG PO TABS: 1.0000 | ORAL_TABLET | Freq: Four times a day (QID) | ORAL | 0 refills | Status: DC | PRN

## 2017-11-19 NOTE — Telephone Encounter (Signed)
[name] requested NCCSRS Database Verified LR: 10/20/2017 Pended Rx and sent to Dr. Montez Moritaarter for approval. (Laura [name])

## 2017-12-17 ENCOUNTER — Other Ambulatory Visit: Payer: Self-pay | Admitting: *Deleted

## 2017-12-17 MED ORDER — OXYCODONE-ACETAMINOPHEN 10-325 MG PO TABS: 1.0000 | ORAL_TABLET | Freq: Four times a day (QID) | ORAL | 0 refills | Status: DC | PRN

## 2017-12-17 NOTE — Telephone Encounter (Signed)
Patient requested refill NCCSRS Database Verified LR: 11/19/2017 Pharmacy Confirmed Pended Rx and sent to Dr. Montez Moritaarter for approval. (Jessica's patient)

## 2017-12-19 DIAGNOSIS — R87810 Cervical high risk human papillomavirus (HPV) DNA test positive: Secondary | ICD-10-CM

## 2017-12-19 HISTORY — DX: Cervical high risk human papillomavirus (HPV) DNA test positive: R87.810

## 2018-01-06 ENCOUNTER — Encounter: Payer: Self-pay | Admitting: Gynecology

## 2018-01-06 ENCOUNTER — Ambulatory Visit: Payer: Medicare Other | Admitting: Gynecology

## 2018-01-06 VITALS — BP 120/74 | Ht 65.0 in | Wt 219.0 lb

## 2018-01-06 DIAGNOSIS — R87610 Atypical squamous cells of undetermined significance on cytologic smear of cervix (ASC-US): Secondary | ICD-10-CM

## 2018-01-06 DIAGNOSIS — Z1151 Encounter for screening for human papillomavirus (HPV): Secondary | ICD-10-CM

## 2018-01-06 NOTE — Addendum Note (Signed)
Addended by: Dayna BarkerGARDNER, Navdeep Halt K on: 01/06/2018 10:40 AM   Modules accepted: Orders

## 2018-01-06 NOTE — Patient Instructions (Addendum)
Follow-up for Pap smear results.  Schedule your colonoscopy with either:  [facility]   Address: [address], [place], [place] [address]  [phone]    or  [facility]  Address: [address], [place], [place] [address]  [phone]

## 2018-01-06 NOTE — Progress Notes (Signed)
    Laura KiefKathy [name] 15-Oct-1960 [id number]        57 y.o.  G0P0000 new patient presents having had her first Pap smear 10/2016 which showed ASCUS.  No HPV screen was done.  Has never had a Pap smear before this.  Reports being up-to-date with breast exams.  Never had a mammogram.  Is doing Cologuard screening through her primary provider's office.  Past medical history,surgical history, problem list, medications, allergies, family history and social history were all reviewed and documented in the EPIC chart.  Directed ROS with pertinent positives and negatives documented in the history of present illness/assessment and plan.  Exam: Kennon PortelaKim Gardner assistant Vitals:   01/06/18 0946  BP: 120/74  Weight: 219 lb (99.3 kg)  Height: 5\' 5"  (1.651 m)   General appearance:  Normal Abdomen soft nontender without mass guarding rebound Pelvic external BUS vagina normal.  Cervix normal.  Pap smear/HPV.  Uterus normal size midline mobile nontender.  Adnexa without masses or tenderness. Rectal exam is normal  Assessment/Plan:  57 y.o. G0P0000 with history of first Pap smear showing ASCUS 10/2016.  No HPV screen was done.  Pap smear/HPV done today.  If normal then will recommend follow-up Pap smear next year.  If abnormal then we discussed need for colposcopy.  She will follow-up for the Pap smear results.  Otherwise she is doing well from a gynecologic standpoint with no bleeding or significant menopausal symptoms.  She will continue to follow-up with her primary for ongoing general health care and see me as needed.    Dara Lordsimothy P Jonovan Boedecker MD, 10:03 AM 01/06/2018

## 2018-01-13 ENCOUNTER — Encounter: Payer: Self-pay | Admitting: Gynecology

## 2018-01-13 LAB — PAP IG AND HPV HIGH-RISK: HPV DNA HIGH RISK: DETECTED — AB

## 2018-01-13 LAB — HPV TYPE 16 AND 18/45 RNA
HPV Type 16 RNA: NOT DETECTED
HPV Type 18/45 RNA: NOT DETECTED

## 2018-01-20 ENCOUNTER — Other Ambulatory Visit: Payer: Self-pay | Admitting: *Deleted

## 2018-01-20 MED ORDER — OXYCODONE-ACETAMINOPHEN 10-325 MG PO TABS
1.0000 | ORAL_TABLET | Freq: Four times a day (QID) | ORAL | 0 refills | Status: DC | PRN
Start: 2018-01-20 — End: 2018-02-18

## 2018-01-20 NOTE — Telephone Encounter (Signed)
Patient requested refill NCCSRS Database Verified LR: 12/17/2017 Pharmacy Confrimed Pended Rx and sent to [facility] for approval.

## 2018-02-18 ENCOUNTER — Other Ambulatory Visit: Payer: Self-pay | Admitting: *Deleted

## 2018-02-18 MED ORDER — OXYCODONE-ACETAMINOPHEN 10-325 MG PO TABS: 1.0000 | ORAL_TABLET | Freq: Four times a day (QID) | ORAL | 0 refills | Status: DC | PRN

## 2018-02-18 NOTE — Telephone Encounter (Signed)
Last filled 01/20/18 Database checked and verified

## 2018-02-21 ENCOUNTER — Telehealth: Payer: Self-pay

## 2018-02-21 NOTE — Telephone Encounter (Signed)
I left patient a message asking that he call the office to schedule 6 month follow up with Dr. Renato Gails in Dec.

## 2018-02-21 NOTE — Telephone Encounter (Signed)
-----   Message from Sharon Seller, NP sent at 02/18/2018 10:44 AM EDT ----- Please make sure [name] sets up an appt with Dr Renato Gails in Dec for 6 month follow up (needs doctor visit next)- on chronic narcotics so gets monthly prescriptions

## 2018-03-01 NOTE — Telephone Encounter (Signed)
Pt returned call and scheduled appt

## 2018-03-01 NOTE — Telephone Encounter (Signed)
I left a message asking patient to call the office to schedule a 6 month follow up with Dr. Renato Gailseed in Dec.

## 2018-03-21 ENCOUNTER — Other Ambulatory Visit: Payer: Self-pay | Admitting: *Deleted

## 2018-03-21 MED ORDER — OXYCODONE-ACETAMINOPHEN 10-325 MG PO TABS: 1.0000 | ORAL_TABLET | Freq: Four times a day (QID) | ORAL | 0 refills | Status: DC | PRN

## 2018-03-21 NOTE — Telephone Encounter (Signed)
Patient requested refill NCCSRS Database Verified LR: 02/18/2018 [facility] Appt confirmed for 04/11/2018.

## 2018-03-21 NOTE — Telephone Encounter (Signed)
Refill provided must keep upcoming OV for additional refills.

## 2018-03-24 ENCOUNTER — Ambulatory Visit (INDEPENDENT_AMBULATORY_CARE_PROVIDER_SITE_OTHER): Payer: Medicare Other | Admitting: Nurse Practitioner

## 2018-03-24 ENCOUNTER — Ambulatory Visit
Admission: RE | Admit: 2018-03-24 | Discharge: 2018-03-24 | Disposition: A | Discharge status: Home / Self Care | Payer: Medicare Other | Source: Ambulatory Visit | Attending: Nurse Practitioner | Admitting: Nurse Practitioner

## 2018-03-24 ENCOUNTER — Encounter: Payer: Self-pay | Admitting: Nurse Practitioner

## 2018-03-24 VITALS — BP 132/90 | HR 106 | Temp 98.8°F | Ht 65.0 in | Wt 222.0 lb

## 2018-03-24 DIAGNOSIS — M25541 Pain in joints of right hand: Secondary | ICD-10-CM | POA: Diagnosis not present

## 2018-03-24 DIAGNOSIS — Z23 Encounter for immunization: Secondary | ICD-10-CM | POA: Diagnosis not present

## 2018-03-24 IMAGING — CR DG HAND COMPLETE 3+V*R*
3 series · 3 of 3 positions shown · non-contrast
Comparison: None.

CLINICAL DATA: Right hand pain after fall 5 days ago. Pain in the
fifth finger PIP joint.

EXAM:
RIGHT HAND - COMPLETE 3+ VIEW

[x hand pa right]
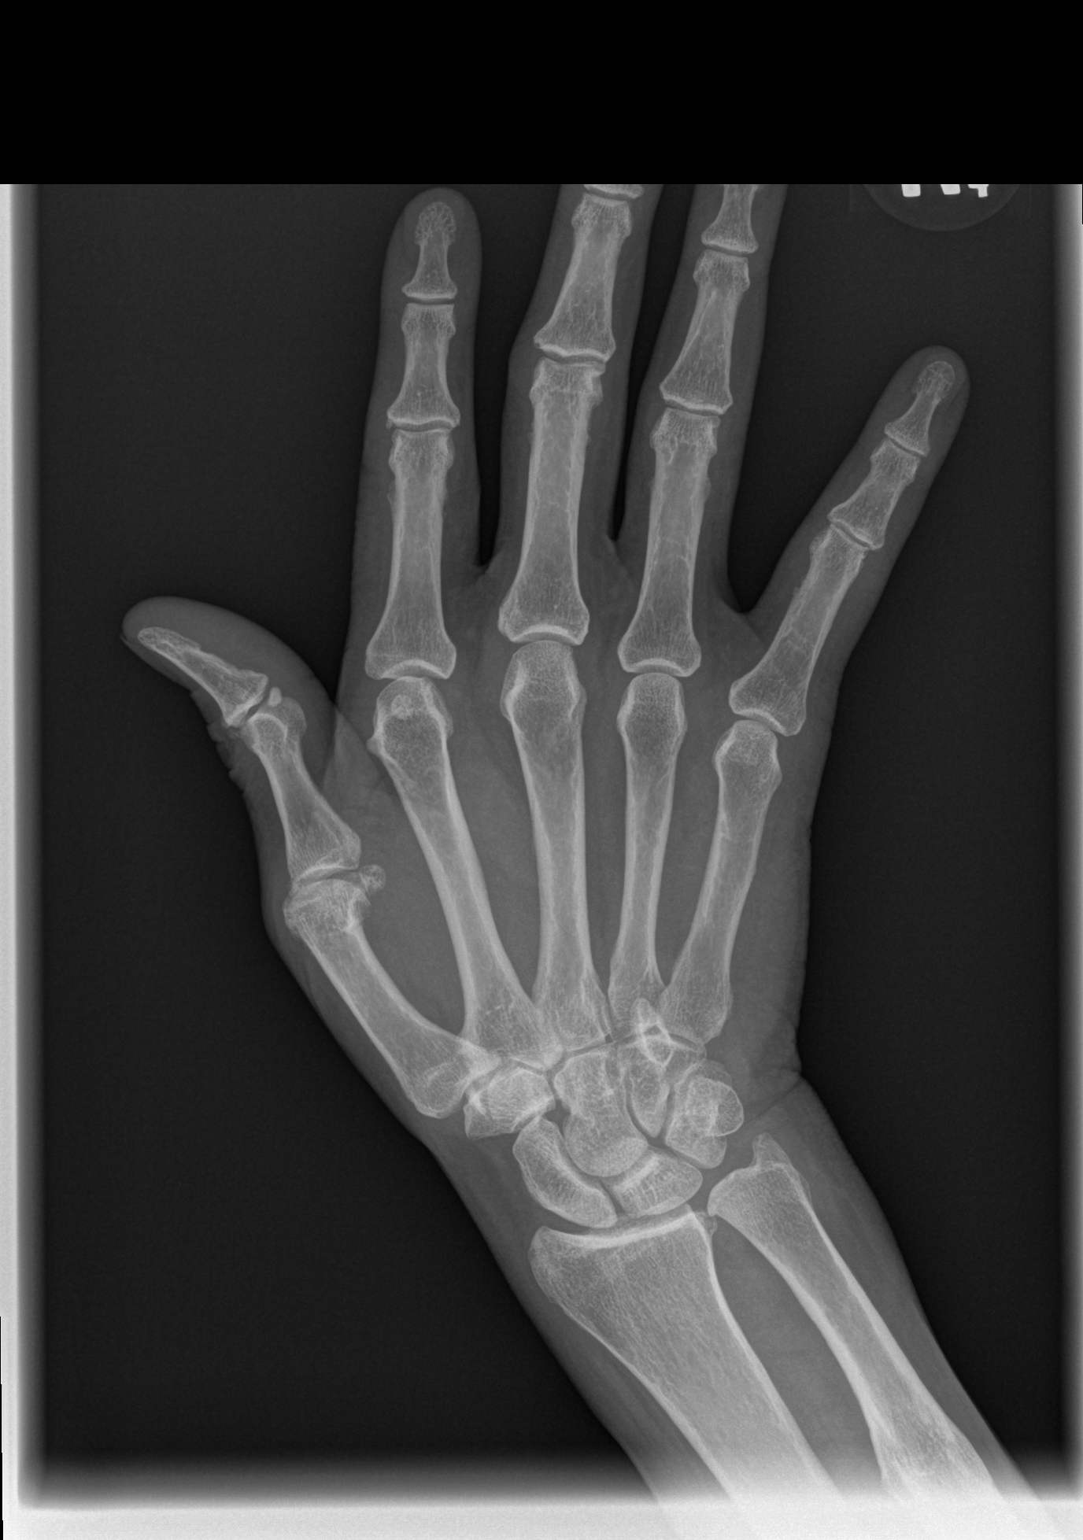

[x hand oblique right]
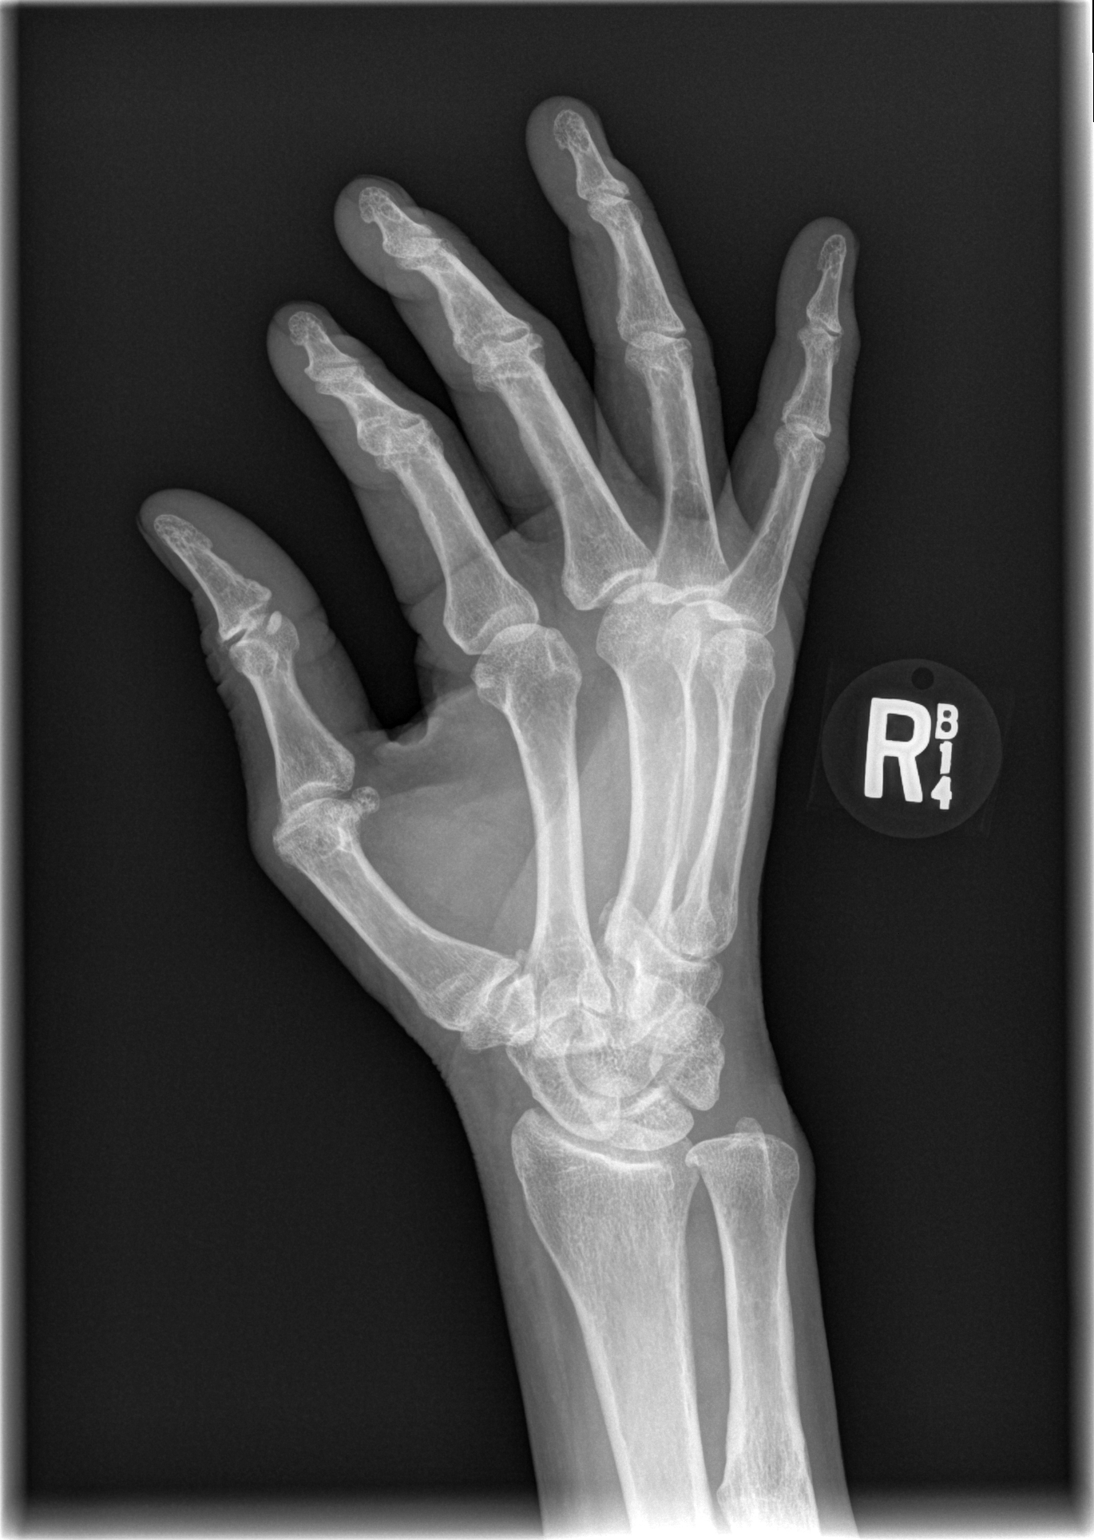

[x hand lat right]
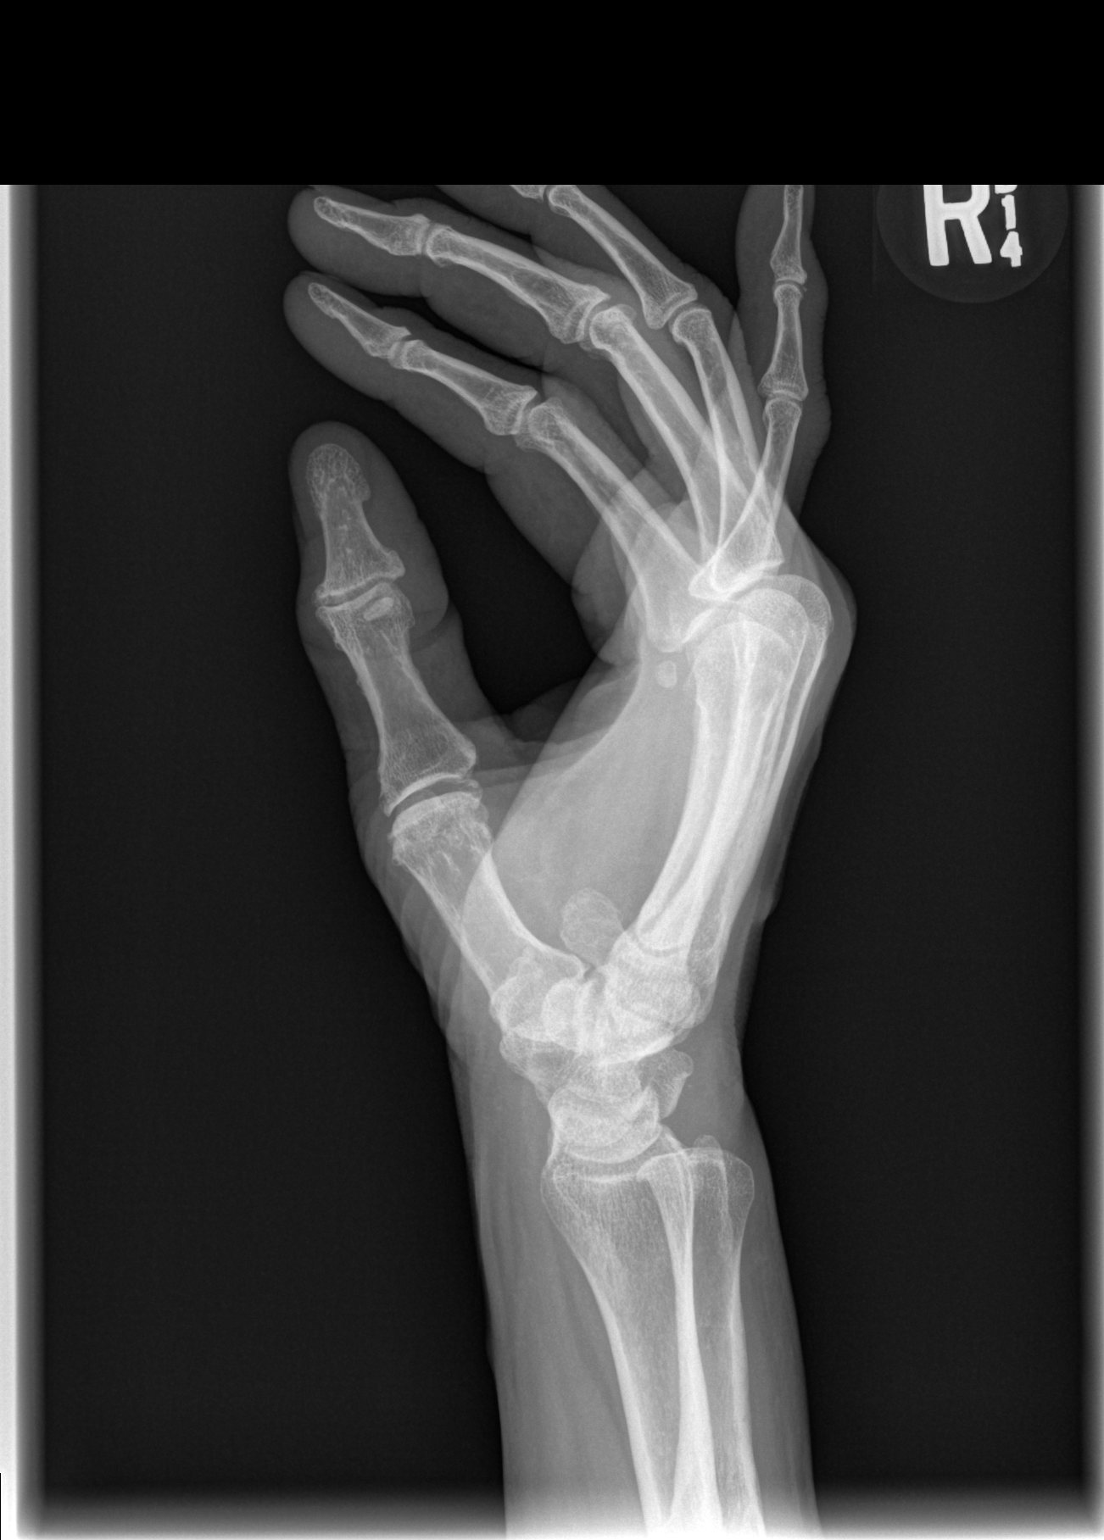

[3 of 3 positions shown; findings below may reference images not displayed]

FINDINGS: Negative for an acute fracture or dislocation in the right hand. Old
fracture with cortical thickening in the distal ulna. Carpal bones
are intact. Mild degenerative changes at the first MCP joint.
IMPRESSION: No acute bone abnormality in the right hand.

## 2018-03-24 NOTE — Patient Instructions (Addendum)
zyrtec (cetirizine) 10 mg by mouth daily for itching.  Make sure you are drinking plenty of water    [facility]

## 2018-03-24 NOTE — Progress Notes (Signed)
Careteam: Patient Care Team: Sharon Seller, NP as PCP - General (Nurse Practitioner)  Advanced Directive information    Allergies  Allergen Reactions  . Aspirin Hives  . Penicillins Hives    Chief Complaint  Patient presents with  . Acute Visit    Right hand injury related to fall on Sunday. Patient also c/o genralized itching all over   . Immunizations    Flu vaccine today    HPI: Patient is a 57 y.o. female seen in the office today due to pain to right hand after fall 5 days ago.  Stepping up on the breezeway at appt complex and pants caught a jagged area and then he feel fwd and caught himself with his right hand. Continues to be swollen and painful to the 5th finger. Taking pain pill which does not help it. Has not used ice.   Reports lots of itching for the last 2 weeks. Reports hormones are bad, lots of hot flashes   Does not want to get blood work today   Review of Systems:  Review of Systems  Constitutional: Negative for chills and fever.  HENT: Negative for hearing loss.   Eyes: Negative for blurred vision.  Respiratory: Positive for cough (chronic unchanged). Negative for shortness of breath and wheezing.   Cardiovascular: Negative for chest pain, palpitations and leg swelling.  Gastrointestinal: Negative for abdominal pain, blood in stool, constipation and melena.  Genitourinary: Negative for dysuria, frequency and urgency.  Musculoskeletal: Positive for back pain and joint pain. Negative for falls.       Pain and swelling to right 5th finger  Skin: Negative for rash.  Neurological: Positive for tingling (right, unchanged). Negative for dizziness and loss of consciousness.  Psychiatric/Behavioral: Negative.  Negative for memory loss.    Past Medical History:  Diagnosis Date  . Acute bronchitis   . Cervical high risk HPV (human papillomavirus) test positive 12/2017   Negative subtype 16, 18/45  . Chronic back pain   . Complication of anesthesia      "didn't wake up"  . Cough   . Insomnia, unspecified   . Other malaise and fatigue   . Other specified diseases of blood and blood-forming organs(289.89)   . Tobacco use disorder    Past Surgical History:  Procedure Laterality Date  . ESOPHAGOGASTRODUODENOSCOPY N/A 03/17/2013   Procedure: ESOPHAGOGASTRODUODENOSCOPY (EGD);  Surgeon: Meryl Dare, MD;  Location: [facility] ENDOSCOPY;  Service: Endoscopy;  Laterality: N/A;  . FRACTURE SURGERY    . TONSILLECTOMY     Social History:   reports that she has been smoking cigarettes. She has a 6.00 pack-year smoking history. She has never used smokeless tobacco. She reports that she drinks alcohol. She reports that she does not use drugs.  Family History  Problem Relation Age of Onset  . Arthritis Mother   . Hypertension Sister   . Hypertension Brother   . Hypertension Sister   . Hypertension Sister   . Hypertension Sister     Medications: Patient's Medications  New Prescriptions   No medications on file  Previous Medications   ALBUTEROL (PROVENTIL HFA;VENTOLIN HFA) 108 (90 BASE) MCG/ACT INHALER    Inhale 2 puffs into the lungs every 6 (six) hours as needed for wheezing. For shortness of breath   FOLIC ACID (FOLVITE) 1 MG TABLET    Take 2 tablets (2 mg total) by mouth daily.   OXYCODONE-ACETAMINOPHEN (PERCOCET) 10-325 MG TABLET    Take 1 tablet by mouth every  6 (six) hours as needed for pain.  Modified Medications   No medications on file  Discontinued Medications   CHLORHEXIDINE (PERIDEX) 0.12 % SOLUTION    Use as directed 15 mLs in the mouth or throat 2 (two) times daily.     Physical Exam:  Vitals:   03/24/18 1117  BP: 132/90  Pulse: (!) 106  Temp: 98.8 F (37.1 C)  TempSrc: Oral  SpO2: 95%  Weight: 222 lb (100.7 kg)  Height: 5\' 5"  (1.651 m)   Body mass index is 36.94 kg/m.  Physical Exam Constitutional:      Appearance: She is well-developed.  HENT:     Head: Normocephalic and atraumatic.  Eyes:     Pupils:  Pupils are equal, round, and reactive to light.  Neck:     Musculoskeletal: Normal range of motion and neck supple.  Cardiovascular:     Rate and Rhythm: Normal rate and regular rhythm.     Heart sounds: Normal heart sounds.  Pulmonary:     Effort: Pulmonary effort is normal.     Breath sounds: Normal breath sounds.  Musculoskeletal:        General: Swelling and tenderness present.     Comments: Pain and tenderness to right 5th finger at the PIP joint   Skin:    General: Skin is warm and dry.  Neurological:     Mental Status: She is alert and oriented to person, place, and time.     Labs reviewed: Basic Metabolic Panel: Recent Labs    08/10/17 1554  NA 135  K CANCELED  CL 95*  CO2 31  GLUCOSE 85  BUN 9  CREATININE 0.48*  CALCIUM 9.0  TSH 3.93   Liver Function Tests: Recent Labs    08/10/17 1554  AST CANCELED  ALT 23  BILITOT CANCELED  PROT 7.1   No results for input(s): LIPASE, AMYLASE in the last 8760 hours. No results for input(s): AMMONIA in the last 8760 hours. CBC: Recent Labs    04/29/17 1409 08/10/17 1554 09/27/17 1225  WBC 4.0 4.3 3.9  NEUTROABS 2,608 3,410 1,985  HGB 13.0 10.9* 13.2  HCT 36.2 30.2* 37.0  MCV 110.4* 109.8* 105.4*  PLT 232 159 186   Lipid Panel: No results for input(s): CHOL, HDL, LDLCALC, TRIG, CHOLHDL, LDLDIRECT in the last 8760 hours. TSH: Recent Labs    08/10/17 1554  TSH 3.93   A1C: Lab Results  Component Value Date   HGBA1C 4.8 07/01/2015     Assessment/Plan 1. Pain involving joint of finger of right hand 5 days after fall with pain and swelling to right 5th finger. Pt is on chronic pain regimen of oxycodone-apap, can not take NSAIDS. Encouraged topical rub at this time.  - DG Hand Complete Right; Future rule out fracture.   2. Need for immunization against influenza - Flu Vaccine QUAD 36+ mos IM  Next appt: as scheduled, 04/11/2018.  Janene HarveyJessica K. Biagio BorgEubanks, AGNP  [facility] [phone]

## 2018-04-11 ENCOUNTER — Encounter: Payer: Self-pay | Admitting: Internal Medicine

## 2018-04-11 ENCOUNTER — Other Ambulatory Visit: Payer: Self-pay | Admitting: *Deleted

## 2018-04-11 ENCOUNTER — Ambulatory Visit (INDEPENDENT_AMBULATORY_CARE_PROVIDER_SITE_OTHER): Payer: Medicare Other | Admitting: Internal Medicine

## 2018-04-11 VITALS — BP 158/100 | HR 86 | Temp 98.2°F | Ht 65.0 in | Wt 223.0 lb

## 2018-04-11 DIAGNOSIS — E538 Deficiency of other specified B group vitamins: Secondary | ICD-10-CM

## 2018-04-11 DIAGNOSIS — Z720 Tobacco use: Secondary | ICD-10-CM

## 2018-04-11 DIAGNOSIS — M5441 Lumbago with sciatica, right side: Secondary | ICD-10-CM | POA: Diagnosis not present

## 2018-04-11 DIAGNOSIS — G8929 Other chronic pain: Secondary | ICD-10-CM

## 2018-04-11 DIAGNOSIS — I10 Essential (primary) hypertension: Secondary | ICD-10-CM | POA: Diagnosis not present

## 2018-04-11 DIAGNOSIS — E669 Obesity, unspecified: Secondary | ICD-10-CM

## 2018-04-11 DIAGNOSIS — Z6837 Body mass index (BMI) 37.0-37.9, adult: Secondary | ICD-10-CM

## 2018-04-11 DIAGNOSIS — D539 Nutritional anemia, unspecified: Secondary | ICD-10-CM

## 2018-04-11 NOTE — Patient Instructions (Addendum)
Please schedule your mammogram after the holidays.  [facility]  Record your blood pressures for the next week and give us a call with your readings.  We can determine if you need medications or are ok without them.  Fat and Cholesterol Restricted Eating Plan Getting too much fat and cholesterol in your diet may cause health problems. Choosing the right foods helps keep your fat and cholesterol at normal levels. This can keep you from getting certain diseases. What are tips for following this plan? Meal planning  At meals, divide your plate into four equal parts: ? Fill one-half of your plate with vegetables and green salads. ? Fill one-fourth of your plate with whole grains. ? Fill one-fourth of your plate with low-fat (lean) protein foods.  Eat fish that is high in omega-3 fats at least two times a week. This includes mackerel, tuna, sardines, and salmon.  Eat foods that are high in fiber, such as whole grains, beans, apples, broccoli, carrots, peas, and barley. General tips   Work with your doctor to lose weight if you need to.  Avoid: ? Foods with added sugar. ? Fried foods. ? Foods with partially hydrogenated oils.  Limit alcohol intake to no more than 1 drink a day for nonpregnant women and 2 drinks a day for men. One drink equals 12 oz of beer, 5 oz of wine, or 1 oz of hard liquor. Reading food labels  Check food labels for: ? Trans fats. ? Partially hydrogenated oils. ? Saturated fat (g) in each serving. ? Cholesterol (mg) in each serving. ? Fiber (g) in each serving.  Choose foods with healthy fats, such as: ? Monounsaturated fats. ? Polyunsaturated fats. ? Omega-3 fats.  Choose grain products that have whole grains. Look for the word "whole" as the first word in the ingredient list. Cooking  Cook foods using low-fat methods. These include baking, boiling, grilling, and broiling.  Eat more home-cooked foods. Eat at  restaurants and buffets less often.  Avoid cooking using saturated fats, such as butter, cream, palm oil, palm kernel oil, and coconut oil. Recommended foods  Fruits  All fresh, canned (in natural juice), or frozen fruits. Vegetables  Fresh or frozen vegetables (raw, steamed, roasted, or grilled). Green salads. Grains  Whole grains, such as whole wheat or whole grain breads, crackers, cereals, and pasta. Unsweetened oatmeal, bulgur, barley, quinoa, or brown rice. Corn or whole wheat flour tortillas. Meats and other protein foods  Ground beef (85% or leaner), grass-fed beef, or beef trimmed of fat. Skinless chicken or [place]. Ground chicken or [place]. Pork trimmed of fat. All fish and seafood. Egg whites. Dried beans, peas, or lentils. Unsalted nuts or seeds. Unsalted canned beans. Nut butters without added sugar or oil. Dairy  Low-fat or nonfat dairy products, such as skim or 1% milk, 2% or reduced-fat cheeses, low-fat and fat-free ricotta or cottage cheese, or plain low-fat and nonfat yogurt. Fats and oils  Tub margarine without trans fats. Light or reduced-fat mayonnaise and salad dressings. Avocado. Olive, canola, sesame, or safflower oils. The items listed above may not be a complete list of foods and beverages you can eat. Contact a dietitian for more information. Foods to avoid Fruits  Canned fruit in heavy syrup. Fruit in cream or butter sauce. Fried fruit. Vegetables  Vegetables cooked in cheese, cream, or butter sauce. Fried vegetables. Grains  White bread. White pasta. White rice. Cornbread. Bagels, pastries, and croissants. Crackers and snack  foods that contain trans fat and hydrogenated oils. Meats and other protein foods  Fatty cuts of meat. Ribs, chicken wings, bacon, sausage, bologna, salami, chitterlings, fatback, hot dogs, bratwurst, and packaged lunch meats. Liver and organ meats. Whole eggs and egg yolks. Chicken and [place] with skin. Fried meat. Dairy  Whole  or 2% milk, cream, half-and-half, and cream cheese. Whole milk cheeses. Whole-fat or sweetened yogurt. Full-fat cheeses. Nondairy creamers and whipped toppings. Processed cheese, cheese spreads, and cheese curds. Beverages  Alcohol. Sugar-sweetened drinks such as sodas, lemonade, and fruit drinks. Fats and oils  Butter, stick margarine, lard, shortening, ghee, or bacon fat. Coconut, palm kernel, and palm oils. Sweets and desserts  Corn syrup, sugars, honey, and molasses. Candy. Jam and jelly. Syrup. Sweetened cereals. Cookies, pies, cakes, donuts, muffins, and ice cream. The items listed above may not be a complete list of foods and beverages you should avoid. Contact a dietitian for more information. Summary  Choosing the right foods helps keep your fat and cholesterol at normal levels. This can keep you from getting certain diseases.  At meals, fill one-half of your plate with vegetables and green salads.  Eat high-fiber foods, like whole grains, beans, apples, carrots, peas, and barley.  Limit added sugar, saturated fats, alcohol, and fried foods. This information is not intended to replace advice given to you by your health care provider. Make sure you discuss any questions you have with your health care provider. Document Released: 10/06/2011 Document Revised: 12/08/2017 Document Reviewed: 12/22/2016 Elsevier Interactive Patient Education  2019 [employer]

## 2018-04-11 NOTE — Progress Notes (Signed)
Location:  [facility] clinic Provider:  Tiana Sivertson L. Renato Gails, D.O., C.M.D.  Goals of Care:  Advanced Directives 09/27/2017  Does Patient Have a Medical Advance Directive? No  Would patient like information on creating a medical advance directive? Yes (MAU/Ambulatory/Procedural Areas - Information given)  Pre-existing out of facility DNR order (yellow form or pink MOST form) -   Chief Complaint  Patient presents with  . Medical Management of Chronic Issues    follow-up    HPI: Patient is a 57 y.o. female seen today for medical management of chronic diseases.   Nothing is new.  BP elevated today.  Smoked some cigarettes just before wakling in.  Not on meds (thought was at one point, but came back off)  Pain:  Her back continues to bother in the lower part--no change there. Remains improved with oxycodone/apap.   Uses cane for balance   Breathing:  Only needing albuterol every once in a while, not daily.    Alcohol:  Does not drink that much.  Seems appts fall at holidays, football or birthday and then alcohol shows up on drug screens--even happened in the morning.      She is going home for the holidays which is [place].    Tobacco abuse:  1/2ppd these days.  She has no desire to change that habit when asked.    Had her pap in September.  She says she is allergic to pain and does not want to get her mammogram.  Compared pain of mammogram to mastectomies or chemo for cancer, but she does agree to one after the holidays.  Says she will schedule after the holidays for a mammogram.  She did eat today.  Has been eating pork rinds which also helped elevate her bp here.  At home 132/60, 138/70 etc at home.  She knows she shouldn't eat them, but says everything causes high blood pressure.   Past Medical History:  Diagnosis Date  . Acute bronchitis   . Cervical high risk HPV (human papillomavirus) test positive 12/2017   Negative subtype 16, 18/45  . Chronic back pain   . Complication of  anesthesia    "didn't wake up"  . Cough   . Insomnia, unspecified   . Other malaise and fatigue   . Other specified diseases of blood and blood-forming organs(289.89)   . Tobacco use disorder     Past Surgical History:  Procedure Laterality Date  . ESOPHAGOGASTRODUODENOSCOPY N/A 03/17/2013   Procedure: ESOPHAGOGASTRODUODENOSCOPY (EGD);  Surgeon: Meryl Dare, MD;  Location: [facility] ENDOSCOPY;  Service: Endoscopy;  Laterality: N/A;  . FRACTURE SURGERY    . TONSILLECTOMY      Allergies  Allergen Reactions  . Aspirin Hives  . Penicillins Hives    Outpatient Encounter Medications as of 04/11/2018  Medication Sig  . albuterol (PROVENTIL HFA;VENTOLIN HFA) 108 (90 Base) MCG/ACT inhaler Inhale 2 puffs into the lungs every 6 (six) hours as needed for wheezing. For shortness of breath  . folic acid (FOLVITE) 1 MG tablet Take 2 tablets (2 mg total) by mouth daily.  Marland Kitchen oxyCODONE-acetaminophen (PERCOCET) 10-325 MG tablet Take 1 tablet by mouth every 6 (six) hours as needed for pain.   No facility-administered encounter medications on file as of 04/11/2018.     Review of Systems:  Review of Systems  Constitutional: Negative for chills, fever, malaise/fatigue and weight loss.  HENT: Negative for congestion and hearing loss.   Eyes: Negative for blurred vision.  Respiratory: Positive for cough,  sputum production and wheezing. Negative for shortness of breath.   Cardiovascular: Negative for chest pain, palpitations and leg swelling.  Gastrointestinal: Negative for abdominal pain, blood in stool, constipation, diarrhea and melena.  Genitourinary: Negative for dysuria.  Musculoskeletal: Positive for back pain. Negative for falls and joint pain.  Skin: Negative for rash.  Neurological: Negative for dizziness and loss of consciousness.  Psychiatric/Behavioral: Negative for depression and memory loss. The patient is not nervous/anxious and does not have insomnia.     Health Maintenance  Topic  Date Due  . Hepatitis C Screening  1960/12/14  . HIV Screening  09/[birth year]/1977  . MAMMOGRAM  10/18/2018 (Originally 09/08/2012)  . COLONOSCOPY  05/20/2020 (Originally 09/08/2012)  . PAP SMEAR-Modifier  01/06/2021  . TETANUS/TDAP  10/06/2025  . INFLUENZA VACCINE  Completed    Physical Exam: Vitals:   04/11/18 1316  BP: (!) 160/90  Pulse: 86  Temp: 98.2 F (36.8 C)  TempSrc: Oral  SpO2: 96%  Weight: 223 lb (101.2 kg)  Height: 5\' 5"  (1.651 m)   Body mass index is 37.11 kg/m. Physical Exam Vitals signs reviewed.  Constitutional:      Appearance: Normal appearance. She is obese.     Comments: Playing a game on her phone during visit  HENT:     Head: Normocephalic and atraumatic.  Cardiovascular:     Rate and Rhythm: Normal rate and regular rhythm.  Pulmonary:     Effort: Pulmonary effort is normal. No respiratory distress.     Breath sounds: Normal breath sounds. No wheezing.  Abdominal:     General: Bowel sounds are normal.  Musculoskeletal: Normal range of motion.     Comments: Ambulates with cane  Neurological:     General: No focal deficit present.     Mental Status: She is alert and oriented to person, place, and time.  Psychiatric:        Mood and Affect: Mood normal.     Labs reviewed: Basic Metabolic Panel: Recent Labs    08/10/17 1554  NA 135  K CANCELED  CL 95*  CO2 31  GLUCOSE 85  BUN 9  CREATININE 0.48*  CALCIUM 9.0  TSH 3.93   Liver Function Tests: Recent Labs    08/10/17 1554  AST CANCELED  ALT 23  BILITOT CANCELED  PROT 7.1   No results for input(s): LIPASE, AMYLASE in the last 8760 hours. No results for input(s): AMMONIA in the last 8760 hours. CBC: Recent Labs    04/29/17 1409 08/10/17 1554 09/27/17 1225  WBC 4.0 4.3 3.9  NEUTROABS 2,608 3,410 1,985  HGB 13.0 10.9* 13.2  HCT 36.2 30.2* 37.0  MCV 110.4* 109.8* 105.4*  PLT 232 159 186   Lipid Panel: No results for input(s): CHOL, HDL, LDLCALC, TRIG, CHOLHDL, LDLDIRECT in the  last 8760 hours. Lab Results  Component Value Date   HGBA1C 4.8 07/01/2015    Procedures since last visit: Dg Hand Complete Right  Result Date: 03/24/2018 CLINICAL DATA:  Right hand pain after fall 5 days ago. Pain in the fifth finger PIP joint. EXAM: RIGHT HAND - COMPLETE 3+ VIEW COMPARISON:  None. FINDINGS: Negative for an acute fracture or dislocation in the right hand. Old fracture with cortical thickening in the distal ulna. Carpal bones are intact. Mild degenerative changes at the first MCP joint. IMPRESSION: No acute bone abnormality in the right hand. Electronically Signed   By: Richarda OverlieAdam  Henn M.D.   On: 03/24/2018 16:56   Assessment/Plan 1. Body mass  index (BMI) of 37.0-37.9 in adult -says she knows it's one of NP's pet peeves that she needs to lose weight--confirmed it with her -increase exercise and improve dietary choices -due to her htn, she is technically morbidly obese--likely has hyperlipidemia also--needs FLP done  2. Essential hypertension -bp sky high today; but reports normal bps at home--asked her to document these and call them in  3. Chronic right-sided low back pain with right-sided sciatica -cont percocet, but if drug screen shows up with alcohol or other nonprescribed meds or illicits, NP may need to refer to pain mgt or taper off meds for safety  4. Tobacco abuse -ongoing, does not wish to quit despite risks  5. Macrocytic anemia -cont folic acid due to alcohol intake--did not give straight answers during AUDIT-c  La.bs/tests ordered:  No orders of the defined types were placed in this encounter. did labs that Shanda BumpsJessica already ordered today since not done before appt as intended  Next appt:  09/30/2018   Tanisha Lutes L. Masud Holub, D.O. Geriatrics [facility] [address] [place], [place] [address] Cell Phone (Mon-Fri 8am-5pm):  [phone] On Call:  [phone] & follow prompts after 5pm & weekends Office Phone:   [phone] Office Fax:  [phone]

## 2018-04-12 DIAGNOSIS — I10 Essential (primary) hypertension: Secondary | ICD-10-CM | POA: Insufficient documentation

## 2018-04-12 DIAGNOSIS — D539 Nutritional anemia, unspecified: Secondary | ICD-10-CM | POA: Insufficient documentation

## 2018-04-12 DIAGNOSIS — Z720 Tobacco use: Secondary | ICD-10-CM | POA: Insufficient documentation

## 2018-04-12 DIAGNOSIS — Z6837 Body mass index (BMI) 37.0-37.9, adult: Secondary | ICD-10-CM | POA: Insufficient documentation

## 2018-04-12 LAB — CBC WITH DIFFERENTIAL/PLATELET
Absolute Monocytes: 471 cells/uL (ref 200–950)
Basophils Absolute: 101 cells/uL (ref 0–200)
Basophils Relative: 2.3 %
Eosinophils Absolute: 9 cells/uL — ABNORMAL LOW (ref 15–500)
Eosinophils Relative: 0.2 %
HEMATOCRIT: 40.8 % (ref 35.0–45.0)
HEMOGLOBIN: 14.2 g/dL (ref 11.7–15.5)
LYMPHS ABS: 708 {cells}/uL — AB (ref 850–3900)
MCH: 37.5 pg — ABNORMAL HIGH (ref 27.0–33.0)
MCHC: 34.8 g/dL (ref 32.0–36.0)
MCV: 107.7 fL — ABNORMAL HIGH (ref 80.0–100.0)
MPV: 11.5 fL (ref 7.5–12.5)
Monocytes Relative: 10.7 %
Neutro Abs: 3111 cells/uL (ref 1500–7800)
Neutrophils Relative %: 70.7 %
Platelets: 216 10*3/uL (ref 140–400)
RBC: 3.79 10*6/uL — ABNORMAL LOW (ref 3.80–5.10)
RDW: 12.6 % (ref 11.0–15.0)
Total Lymphocyte: 16.1 %
WBC: 4.4 10*3/uL (ref 3.8–10.8)

## 2018-04-12 LAB — COMPLETE METABOLIC PANEL WITH GFR
AG Ratio: 1.3 (calc) (ref 1.0–2.5)
ALBUMIN MSPROF: 4.2 g/dL (ref 3.6–5.1)
ALT: 14 U/L (ref 6–29)
AST: 32 U/L (ref 10–35)
Alkaline phosphatase (APISO): 109 U/L (ref 33–130)
BUN/Creatinine Ratio: 23 (calc) — ABNORMAL HIGH (ref 6–22)
BUN: 11 mg/dL (ref 7–25)
CALCIUM: 9.9 mg/dL (ref 8.6–10.4)
CO2: 29 mmol/L (ref 20–32)
CREATININE: 0.47 mg/dL — AB (ref 0.50–1.05)
Chloride: 97 mmol/L — ABNORMAL LOW (ref 98–110)
GFR, EST NON AFRICAN AMERICAN: 111 mL/min/{1.73_m2} (ref >=60)
GFR, Est African American: 129 mL/min/{1.73_m2} (ref >=60)
GLOBULIN: 3.3 g/dL (ref 1.9–3.7)
GLUCOSE: 108 mg/dL (ref 65–139)
Potassium: 4.1 mmol/L (ref 3.5–5.3)
Sodium: 138 mmol/L (ref 135–146)
Total Bilirubin: 1.4 mg/dL — ABNORMAL HIGH (ref 0.2–1.2)
Total Protein: 7.5 g/dL (ref 6.1–8.1)

## 2018-04-12 LAB — LIPID PANEL
CHOL/HDL RATIO: 1.9 (calc) (ref <5.0)
CHOLESTEROL: 237 mg/dL — AB (ref <200)
HDL: 128 mg/dL (ref >50)
LDL CHOLESTEROL (CALC): 92 mg/dL
NON-HDL CHOLESTEROL (CALC): 109 mg/dL (ref <130)
TRIGLYCERIDES: 76 mg/dL (ref <150)

## 2018-04-12 LAB — FOLATE: Folate: 7.5 ng/mL

## 2018-04-15 ENCOUNTER — Encounter: Payer: Self-pay | Admitting: *Deleted

## 2018-04-21 ENCOUNTER — Telehealth: Payer: Self-pay

## 2018-04-21 NOTE — Telephone Encounter (Signed)
Patient called about lab results and had questions about labs that were highlighted. Discussed with patient lab results and providers notations on lab results. Patient verbalized understanding and had no further questions. Informed patient to call back if they had any further questions.

## 2018-04-22 ENCOUNTER — Other Ambulatory Visit: Payer: Self-pay | Admitting: *Deleted

## 2018-04-22 MED ORDER — OXYCODONE-ACETAMINOPHEN 10-325 MG PO TABS: 1.0000 | ORAL_TABLET | Freq: Four times a day (QID) | ORAL | 0 refills | Status: DC | PRN

## 2018-04-22 NOTE — Telephone Encounter (Signed)
Patient requested refill [place] Verified LR: 03/21/2018 Pended Rx and sent to [facility] for approval.  

## 2018-05-23 ENCOUNTER — Other Ambulatory Visit: Payer: Self-pay | Admitting: *Deleted

## 2018-05-23 MED ORDER — OXYCODONE-ACETAMINOPHEN 10-325 MG PO TABS: 1.0000 | ORAL_TABLET | Freq: Four times a day (QID) | ORAL | 0 refills | Status: DC | PRN

## 2018-05-23 NOTE — Telephone Encounter (Signed)
Patient requested refill [place] Database Verified LR: 04/22/2018 Pended Rx and sent to [facility] for Approval.

## 2018-06-21 ENCOUNTER — Other Ambulatory Visit: Payer: Self-pay | Admitting: *Deleted

## 2018-06-21 MED ORDER — OXYCODONE-ACETAMINOPHEN 10-325 MG PO TABS: 1.0000 | ORAL_TABLET | Freq: Four times a day (QID) | ORAL | 0 refills | Status: DC | PRN

## 2018-06-21 NOTE — Telephone Encounter (Signed)
Patient requested refill [place] Database Verified LR: 05/23/2018 #120 Pended Rx and sent to [facility] for approval.

## 2018-07-12 ENCOUNTER — Other Ambulatory Visit: Payer: Self-pay | Admitting: Nurse Practitioner

## 2018-07-12 DIAGNOSIS — R059 Cough, unspecified: Secondary | ICD-10-CM

## 2018-07-12 DIAGNOSIS — R05 Cough: Secondary | ICD-10-CM

## 2018-07-12 NOTE — Telephone Encounter (Signed)
Has an OV 07/2018.

## 2018-07-13 ENCOUNTER — Telehealth: Payer: Self-pay | Admitting: *Deleted

## 2018-07-13 NOTE — Telephone Encounter (Signed)
Patient called and stated that she needed a Prior Authorization on her ProAir. Stated that it cost $90.  Initiated Prior Auth through [employer]. Went into determination.  BIN: [id number] PCN: [id number] Grp: [id number]

## 2018-07-13 NOTE — Telephone Encounter (Signed)
Received fax from [facility] [phone] stating that Laura [name] is on patient's plan's list of Covered Drugs and DOES NOT require prior authorization.  Patient ID: [id number] PA :[id number]

## 2018-07-22 ENCOUNTER — Other Ambulatory Visit: Payer: Self-pay | Admitting: *Deleted

## 2018-07-22 MED ORDER — OXYCODONE-ACETAMINOPHEN 10-325 MG PO TABS: 1.0000 | ORAL_TABLET | Freq: Four times a day (QID) | ORAL | 0 refills | Status: DC | PRN

## 2018-07-22 NOTE — Telephone Encounter (Signed)
Patient requested [place] Database Verified LR: 06/21/2018 Pended Rx and sent to [facility] for approval.

## 2018-08-06 ENCOUNTER — Other Ambulatory Visit: Payer: Self-pay | Admitting: Nurse Practitioner

## 2018-08-06 DIAGNOSIS — R05 Cough: Secondary | ICD-10-CM

## 2018-08-06 DIAGNOSIS — R059 Cough, unspecified: Secondary | ICD-10-CM

## 2018-08-08 ENCOUNTER — Other Ambulatory Visit: Payer: Self-pay | Admitting: Nurse Practitioner

## 2018-08-08 DIAGNOSIS — R059 Cough, unspecified: Secondary | ICD-10-CM

## 2018-08-08 DIAGNOSIS — R05 Cough: Secondary | ICD-10-CM

## 2018-08-08 NOTE — Telephone Encounter (Signed)
Per verbal from [place] 90 day supply on inhaler not appropriate, can be discussed at pending appointment on 08/10/2018

## 2018-08-10 ENCOUNTER — Ambulatory Visit: Payer: Medicare Other | Admitting: Nurse Practitioner

## 2018-08-11 ENCOUNTER — Ambulatory Visit (INDEPENDENT_AMBULATORY_CARE_PROVIDER_SITE_OTHER): Payer: Medicare Other | Admitting: Nurse Practitioner

## 2018-08-11 ENCOUNTER — Encounter: Payer: Self-pay | Admitting: Nurse Practitioner

## 2018-08-11 ENCOUNTER — Other Ambulatory Visit: Payer: Self-pay

## 2018-08-11 DIAGNOSIS — J209 Acute bronchitis, unspecified: Secondary | ICD-10-CM | POA: Diagnosis not present

## 2018-08-11 MED ORDER — DOXYCYCLINE HYCLATE 100 MG PO TABS: 100.0000 mg | ORAL_TABLET | Freq: Two times a day (BID) | ORAL | 0 refills | Status: DC

## 2018-08-11 NOTE — Patient Instructions (Addendum)
Doxycycline 100 mg by mouth twice daily with food Probiotic twice daily to prevent upset stomach.  Plain nasal saline spray throughout the day as needed May use tylenol 325 mg 2 tablets every 6 hours as needed aches and pains or sore throat humidifier in the home to help with the dry air Mucinex DM by mouth twice daily as needed for cough and congestion with full glass of water  Keep well hydrated To continue to use albuterol PRN

## 2018-08-11 NOTE — Progress Notes (Signed)
This service is provided via telemedicine  No vital signs collected/recorded due to the encounter was a telemedicine visit.   Location of patient (ex: home, work):Home  Patient consents to a telephone visit:Yes  Location of the provider (ex: office, home):Office   Name of any referring provider: Abbey Chatters NP    Names of all persons participating in the telemedicine service and their role in the encounter:  Tiffany Andi Hence NP, patient Laura [name]  Time spent on call: CMA spent   Patient has a tele visit for a cough and headache, runny nose started 3 weeks ago symptoms have gotten worse,body aches,Sob have taken nyquil but not helping.  Virtual Visit via Telephone Note  I connected with Laura [name] on 08/11/18 at  2:15 PM EDT by telephone and verified that I am speaking with the correct person using two identifiers.   I discussed the limitations, risks, security and privacy concerns of performing an evaluation and management service by telephone and the availability of in person appointments. I also discussed with the patient that there may be a patient responsible charge related to this service. The patient expressed understanding and agreed to proceed.     Careteam: Patient Care Team: Sharon Seller, NP as PCP - General (Nurse Practitioner)  Advanced Directive information Does Patient Have a Medical Advance Directive?: No, Would patient like information on creating a medical advance directive?: No - Patient declined  Allergies  Allergen Reactions  . Aspirin Hives  . Penicillins Hives    Chief Complaint  Patient presents with  . Acute Visit    cough,headache and runny nose, body aches,not sure if running a fever, little SOB, symptoms started 3 weeks ago      HPI: Patient is a 58 y.o. female via televisit.  Reports cough for 3 weeks, has been taking Nyquil. Walks from car to appt and gets more short of breathing. Having to use  inhaler more when going out. Currently using albuterol daily (which is chronic) Feels warm when laying down at night then turns to chills. Cough and congested but not always coughing anything up.  Congested in chest and nose.      Review of Systems:  Review of Systems  Constitutional: Positive for chills and malaise/fatigue. Negative for fever.  HENT: Positive for congestion. Negative for ear discharge, ear pain, sinus pain and sore throat.   Respiratory: Positive for cough, sputum production, shortness of breath (with activity) and wheezing (no increase in wheezing, mostly in the morning).   Cardiovascular: Negative for chest pain and palpitations.    Past Medical History:  Diagnosis Date  . Acute bronchitis   . Cervical high risk HPV (human papillomavirus) test positive 12/2017   Negative subtype 16, 18/45  . Chronic back pain   . Complication of anesthesia    "didn't wake up"  . Cough   . Insomnia, unspecified   . Other malaise and fatigue   . Other specified diseases of blood and blood-forming organs(289.89)   . Tobacco use disorder    Past Surgical History:  Procedure Laterality Date  . ESOPHAGOGASTRODUODENOSCOPY N/A 03/17/2013   Procedure: ESOPHAGOGASTRODUODENOSCOPY (EGD);  Surgeon: Meryl Dare, MD;  Location: [facility] ENDOSCOPY;  Service: Endoscopy;  Laterality: N/A;  . FRACTURE SURGERY    . TONSILLECTOMY     Social History:   reports that she has been smoking cigarettes. She has a 6.00 pack-year smoking history. She has never used smokeless tobacco. She reports current alcohol use. She reports  that she does not use drugs.  Family History  Problem Relation Age of Onset  . Arthritis Mother   . Hypertension Sister   . Hypertension Brother   . Hypertension Sister   . Hypertension Sister   . Hypertension Sister     Medications: Patient's Medications  New Prescriptions   No medications on file  Previous Medications   FOLIC ACID (FOLVITE) 1 MG TABLET    Take 2  tablets (2 mg total) by mouth daily.   OXYCODONE-ACETAMINOPHEN (PERCOCET) 10-325 MG TABLET    Take 1 tablet by mouth every 6 (six) hours as needed for pain.   PROAIR HFA 108 (90 BASE) MCG/ACT INHALER    INHALE 2 PUFFS INTO THE LUNGS EVERY 6 HOURS AS NEEDED FOR WHEEZING OR SHORTNESS OF BREATH  Modified Medications   No medications on file  Discontinued Medications   No medications on file     Physical Exam: unable due to televisit     Labs reviewed: Basic Metabolic Panel: Recent Labs    04/11/18 1351  NA 138  K 4.1  CL 97*  CO2 29  GLUCOSE 108  BUN 11  CREATININE 0.47*  CALCIUM 9.9   Liver Function Tests: Recent Labs    04/11/18 1351  AST 32  ALT 14  BILITOT 1.4*  PROT 7.5   No results for input(s): LIPASE, AMYLASE in the last 8760 hours. No results for input(s): AMMONIA in the last 8760 hours. CBC: Recent Labs    09/27/17 1225 04/11/18 1351  WBC 3.9 4.4  NEUTROABS 1,985 3,111  HGB 13.2 14.2  HCT 37.0 40.8  MCV 105.4* 107.7*  PLT 186 216   Lipid Panel: Recent Labs    04/11/18 1351  CHOL 237*  HDL 128  LDLCALC 92  TRIG 76  CHOLHDL 1.9   TSH: No results for input(s): TSH in the last 8760 hours. A1C: Lab Results  Component Value Date   HGBA1C 4.8 07/01/2015     Assessment/Plan 1. Acute bronchitis, unspecified organism Doxycycline 100 mg by mouth twice daily with food Probiotic twice daily to prevent upset stomach.  Plain nasal saline spray throughout the day as needed May use tylenol 325 mg 2 tablets every 6 hours as needed aches and pains or sore throat humidifier in the home to help with the dry air Mucinex DM by mouth twice daily as needed for cough and congestion with full glass of water  Keep well hydrated To continue to use albuterol PRN  - doxycycline (VIBRA-TABS) 100 MG tablet; Take 1 tablet (100 mg total) by mouth 2 (two) times daily.  Dispense: 14 tablet; Refill: 0 -return precautions discussed  Colie Fugitt K. Biagio BorgEubanks,  AGNP  [facility] [phone]   Follow Up Instructions:    I discussed the assessment and treatment plan with the patient. The patient was provided an opportunity to ask questions and all were answered. The patient agreed with the plan and demonstrated an understanding of the instructions.   The patient was advised to call back or seek an in-person evaluation if the symptoms worsen or if the condition fails to improve as anticipated.  I provided 12 minutes of non-face-to-face time during this encounter.  avs printed and mailed.  Sharon SellerJessica K Dareen Gutzwiller, NP

## 2018-08-14 ENCOUNTER — Encounter: Payer: Self-pay | Admitting: Nurse Practitioner

## 2018-08-17 ENCOUNTER — Ambulatory Visit (INDEPENDENT_AMBULATORY_CARE_PROVIDER_SITE_OTHER): Payer: Medicare Other | Admitting: Nurse Practitioner

## 2018-08-17 ENCOUNTER — Encounter: Payer: Self-pay | Admitting: Nurse Practitioner

## 2018-08-17 ENCOUNTER — Other Ambulatory Visit: Payer: Self-pay

## 2018-08-17 DIAGNOSIS — I10 Essential (primary) hypertension: Secondary | ICD-10-CM

## 2018-08-17 DIAGNOSIS — F1011 Alcohol abuse, in remission: Secondary | ICD-10-CM

## 2018-08-17 DIAGNOSIS — J209 Acute bronchitis, unspecified: Secondary | ICD-10-CM

## 2018-08-17 DIAGNOSIS — E538 Deficiency of other specified B group vitamins: Secondary | ICD-10-CM | POA: Diagnosis not present

## 2018-08-17 DIAGNOSIS — Z720 Tobacco use: Secondary | ICD-10-CM

## 2018-08-17 DIAGNOSIS — M5441 Lumbago with sciatica, right side: Secondary | ICD-10-CM

## 2018-08-17 DIAGNOSIS — G8929 Other chronic pain: Secondary | ICD-10-CM

## 2018-08-17 NOTE — Patient Instructions (Signed)
To use MUCINEX or MUCINEX DM  Twice daily with full glass of water.   Encourage you  to cut back on smoking    DASH Eating Plan DASH stands for "Dietary Approaches to Stop Hypertension." The DASH eating plan is a healthy eating plan that has been shown to reduce high blood pressure (hypertension). It may also reduce your risk for type 2 diabetes, heart disease, and stroke. The DASH eating plan may also help with weight loss. What are tips for following this plan?  General guidelines  Avoid eating more than 2,300 mg (milligrams) of salt (sodium) a day. If you have hypertension, you may need to reduce your sodium intake to 1,500 mg a day.  Limit alcohol intake to no more than 1 drink a day for nonpregnant women and 2 drinks a day for men. One drink equals 12 oz of beer, 5 oz of wine, or 1 oz of hard liquor.  Work with your health care provider to maintain a healthy body weight or to lose weight. Ask what an ideal weight is for you.  Get at least 30 minutes of exercise that causes your heart to beat faster (aerobic exercise) most days of the week. Activities may include walking, swimming, or biking.  Work with your health care provider or diet and nutrition specialist (dietitian) to adjust your eating plan to your individual calorie needs. Reading food labels   Check food labels for the amount of sodium per serving. Choose foods with less than 5 percent of the Daily Value of sodium. Generally, foods with less than 300 mg of sodium per serving fit into this eating plan.  To find whole grains, look for the word "whole" as the first word in the ingredient list. Shopping  Buy products labeled as "low-sodium" or "no salt added."  Buy fresh foods. Avoid canned foods and premade or frozen meals. Cooking  Avoid adding salt when cooking. Use salt-free seasonings or herbs instead of table salt or sea salt. Check with your health care provider or pharmacist before using salt substitutes.  Do  not fry foods. Cook foods using healthy methods such as baking, boiling, grilling, and broiling instead.  Cook with heart-healthy oils, such as olive, canola, soybean, or sunflower oil. Meal planning  Eat a balanced diet that includes: ? 5 or more servings of fruits and vegetables each day. At each meal, try to fill half of your plate with fruits and vegetables. ? Up to 6-8 servings of whole grains each day. ? Less than 6 oz of lean meat, poultry, or fish each day. A 3-oz serving of meat is about the same size as a deck of cards. One egg equals 1 oz. ? 2 servings of low-fat dairy each day. ? A serving of nuts, seeds, or beans 5 times each week. ? Heart-healthy fats. Healthy fats called Omega-3 fatty acids are found in foods such as flaxseeds and coldwater fish, like sardines, salmon, and mackerel.  Limit how much you eat of the following: ? Canned or prepackaged foods. ? Food that is high in trans fat, such as fried foods. ? Food that is high in saturated fat, such as fatty meat. ? Sweets, desserts, sugary drinks, and other foods with added sugar. ? Full-fat dairy products.  Do not salt foods before eating.  Try to eat at least 2 vegetarian meals each week.  Eat more home-cooked food and less restaurant, buffet, and fast food.  When eating at a restaurant, ask that your food be  prepared with less salt or no salt, if possible. What foods are recommended? The items listed may not be a complete list. Talk with your dietitian about what dietary choices are best for you. Grains Whole-grain or whole-wheat bread. Whole-grain or whole-wheat pasta. Brown rice. Modena Morrow. Bulgur. Whole-grain and low-sodium cereals. Pita bread. Low-fat, low-sodium crackers. Whole-wheat flour tortillas. Vegetables Fresh or frozen vegetables (raw, steamed, roasted, or grilled). Low-sodium or reduced-sodium tomato and vegetable juice. Low-sodium or reduced-sodium tomato sauce and tomato paste. Low-sodium or  reduced-sodium canned vegetables. Fruits All fresh, dried, or frozen fruit. Canned fruit in natural juice (without added sugar). Meat and other protein foods Skinless chicken or [place]. Ground chicken or [place]. Pork with fat trimmed off. Fish and seafood. Egg whites. Dried beans, peas, or lentils. Unsalted nuts, nut butters, and seeds. Unsalted canned beans. Lean cuts of beef with fat trimmed off. Low-sodium, lean deli meat. Dairy Low-fat (1%) or fat-free (skim) milk. Fat-free, low-fat, or reduced-fat cheeses. Nonfat, low-sodium ricotta or cottage cheese. Low-fat or nonfat yogurt. Low-fat, low-sodium cheese. Fats and oils Soft margarine without trans fats. Vegetable oil. Low-fat, reduced-fat, or light mayonnaise and salad dressings (reduced-sodium). Canola, safflower, olive, soybean, and sunflower oils. Avocado. Seasoning and other foods Herbs. Spices. Seasoning mixes without salt. Unsalted popcorn and pretzels. Fat-free sweets. What foods are not recommended? The items listed may not be a complete list. Talk with your dietitian about what dietary choices are best for you. Grains Baked goods made with fat, such as croissants, muffins, or some breads. Dry pasta or rice meal packs. Vegetables Creamed or fried vegetables. Vegetables in a cheese sauce. Regular canned vegetables (not low-sodium or reduced-sodium). Regular canned tomato sauce and paste (not low-sodium or reduced-sodium). Regular tomato and vegetable juice (not low-sodium or reduced-sodium). Angie Fava. Olives. Fruits Canned fruit in a light or heavy syrup. Fried fruit. Fruit in cream or butter sauce. Meat and other protein foods Fatty cuts of meat. Ribs. Fried meat. Berniece Salines. Sausage. Bologna and other processed lunch meats. Salami. Fatback. Hotdogs. Bratwurst. Salted nuts and seeds. Canned beans with added salt. Canned or smoked fish. Whole eggs or egg yolks. Chicken or [place] with skin. Dairy Whole or 2% milk, cream, and half-and-half.  Whole or full-fat cream cheese. Whole-fat or sweetened yogurt. Full-fat cheese. Nondairy creamers. Whipped toppings. Processed cheese and cheese spreads. Fats and oils Butter. Stick margarine. Lard. Shortening. Ghee. Bacon fat. Tropical oils, such as coconut, palm kernel, or palm oil. Seasoning and other foods Salted popcorn and pretzels. Onion salt, garlic salt, seasoned salt, table salt, and sea salt. Worcestershire sauce. Tartar sauce. Barbecue sauce. Teriyaki sauce. Soy sauce, including reduced-sodium. Steak sauce. Canned and packaged gravies. Fish sauce. Oyster sauce. Cocktail sauce. Horseradish that you find on the shelf. Ketchup. Mustard. Meat flavorings and tenderizers. Bouillon cubes. Hot sauce and Tabasco sauce. Premade or packaged marinades. Premade or packaged taco seasonings. Relishes. Regular salad dressings. Where to find more information:  National Heart, Lung, and [place]: https://wilson-eaton.com/  American Heart Association: www.heart.org Summary  The DASH eating plan is a healthy eating plan that has been shown to reduce high blood pressure (hypertension). It may also reduce your risk for type 2 diabetes, heart disease, and stroke.  With the DASH eating plan, you should limit salt (sodium) intake to 2,300 mg a day. If you have hypertension, you may need to reduce your sodium intake to 1,500 mg a day.  When on the DASH eating plan, aim to eat more fresh fruits and vegetables, whole grains,  lean proteins, low-fat dairy, and heart-healthy fats.  Work with your health care provider or diet and nutrition specialist (dietitian) to adjust your eating plan to your individual calorie needs. This information is not intended to replace advice given to you by your health care provider. Make sure you discuss any questions you have with your health care provider. Document Released: 03/26/2011 Document Revised: 03/30/2016 Document Reviewed: 03/30/2016 Elsevier Interactive Patient Education   2019 [employer]

## 2018-08-17 NOTE — Progress Notes (Signed)
This service is provided via telemedicine  No vital signs collected/recorded due to the encounter was a telemedicine visit.   Location of patient (ex: home, work):  Home  Patient consents to a telephone visit:  Yes  Location of the provider (ex: office, home):  [facility]  Names of all persons participating in the telemedicine service and their role in the encounter:  Teryl LucyYavonda Williamson CMA, Abbey ChattersJessica Bellarae Lizer NP, and patient.    Time spent on call:  2 mins  Virtual Visit via Telephone Note  I connected with Jerald KiefKathy Raz on 08/17/18 at  9:00 AM EDT by telephone and verified that I am speaking with the correct person using two identifiers.   I discussed the limitations, risks, security and privacy concerns of performing an evaluation and management service by telephone and the availability of in person appointments. I also discussed with the patient that there may be a patient responsible charge related to this service. The patient expressed understanding and agreed to proceed.     Careteam: Patient Care Team: Sharon SellerEubanks, Erum Cercone K, NP as PCP - General (Nurse Practitioner)  Advanced Directive information    Allergies  Allergen Reactions  . Aspirin Hives  . Penicillins Hives    Chief Complaint  Patient presents with  . Medical Management of Chronic Issues    4 month follow up. No refills needed     HPI: Patient is a 58 y.o. female for routine follow up.  Did tele-visit last week due to acute bronchitis- completes doxycyline 100 mg by mouth twice daily and reports pharmacy gave her mucinex D  States overall cough in better.  Continues to use inhaler twice daily. Overall shortness of breath improved .  Tobacco abuse- continues to use 1/2 daily. Has cut back since she has been sick.   ETOH abuse- reports not drinking daily any more, currently will drink on the weekends (not during week) a couple of drinks Saturday.   Had scheduled mammogram but had to  reschedule. Plans to get this.   Blood pressure at home 130/80, 132/80. "not high"  Has not check temperature because no thermometer in town. Had to order online.   Chronic back pain- taking oxycodone 10-325 mg every 6 hours as needed. Using for severe pain. Will generally not take medication until it gets severe.  Takes all medication that is prescribed.  Not taking from outside source.   Review of Systems:  Review of Systems  Constitutional: Negative for chills, fever and malaise/fatigue.  HENT: Negative for congestion, ear discharge, ear pain, sinus pain and sore throat.   Respiratory: Positive for cough (improved). Negative for sputum production, shortness of breath and wheezing (no increase in wheezing, mostly in the morning).   Cardiovascular: Negative for chest pain and palpitations.  Gastrointestinal: Negative for constipation and diarrhea.  Genitourinary: Negative for frequency and urgency.  Musculoskeletal: Positive for back pain (low back).    Past Medical History:  Diagnosis Date  . Acute bronchitis   . Cervical high risk HPV (human papillomavirus) test positive 12/2017   Negative subtype 16, 18/45  . Chronic back pain   . Complication of anesthesia    "didn't wake up"  . Cough   . Insomnia, unspecified   . Other malaise and fatigue   . Other specified diseases of blood and blood-forming organs(289.89)   . Tobacco use disorder    Past Surgical History:  Procedure Laterality Date  . ESOPHAGOGASTRODUODENOSCOPY N/A 03/17/2013   Procedure: ESOPHAGOGASTRODUODENOSCOPY (EGD);  Surgeon: Meryl Dare, MD;  Location: [facility] ENDOSCOPY;  Service: Endoscopy;  Laterality: N/A;  . FRACTURE SURGERY    . TONSILLECTOMY     Social History:   reports that she has been smoking cigarettes. She has a 6.00 pack-year smoking history. She has never used smokeless tobacco. She reports current alcohol use. She reports that she does not use drugs.  Family History  Problem Relation Age of  Onset  . Arthritis Mother   . Hypertension Sister   . Hypertension Brother   . Hypertension Sister   . Hypertension Sister   . Hypertension Sister     Medications: Patient's Medications  New Prescriptions   No medications on file  Previous Medications   DOXYCYCLINE (VIBRA-TABS) 100 MG TABLET    Take 1 tablet (100 mg total) by mouth 2 (two) times daily.   FOLIC ACID (FOLVITE) 1 MG TABLET    Take 2 tablets (2 mg total) by mouth daily.   OXYCODONE-ACETAMINOPHEN (PERCOCET) 10-325 MG TABLET    Take 1 tablet by mouth every 6 (six) hours as needed for pain.   PROAIR HFA 108 (90 BASE) MCG/ACT INHALER    INHALE 2 PUFFS INTO THE LUNGS EVERY 6 HOURS AS NEEDED FOR WHEEZING OR SHORTNESS OF BREATH  Modified Medications   No medications on file  Discontinued Medications   No medications on file     Physical Exam: unable due to tele-visit.   Labs reviewed: Basic Metabolic Panel: Recent Labs    04/11/18 1351  NA 138  K 4.1  CL 97*  CO2 29  GLUCOSE 108  BUN 11  CREATININE 0.47*  CALCIUM 9.9   Liver Function Tests: Recent Labs    04/11/18 1351  AST 32  ALT 14  BILITOT 1.4*  PROT 7.5   No results for input(s): LIPASE, AMYLASE in the last 8760 hours. No results for input(s): AMMONIA in the last 8760 hours. CBC: Recent Labs    09/27/17 1225 04/11/18 1351  WBC 3.9 4.4  NEUTROABS 1,985 3,111  HGB 13.2 14.2  HCT 37.0 40.8  MCV 105.4* 107.7*  PLT 186 216   Lipid Panel: Recent Labs    04/11/18 1351  CHOL 237*  HDL 128  LDLCALC 92  TRIG 76  CHOLHDL 1.9   TSH: No results for input(s): TSH in the last 8760 hours. A1C: Lab Results  Component Value Date   HGBA1C 4.8 07/01/2015     Assessment/Plan 1. Tobacco abuse -encouraged cessation or to cut back, she is not willing to quit at this time  2. Essential hypertension -not requiring medication at this time. Home readings controlled. Recommended dietary modifications.  3. Acute bronchitis, unspecified organism  Improving. Recommended not to use mucinex D but to use plain mucinex Or mucinex DM. Continue to use albuterol as needed   4. Chronic right-sided low back pain with right-sided sciatica Stable, ongoing, using oxycodone-apap every 6 hours as needed for severe pain. Has been counseled on not using pain medication with ETOH.   5. H/O ETOH abuse Has cut back on ETOH consumption, reports she is now only drinking on saturdays.   6. Folic acid deficiency Continues on folic acid 2 mg daily  7. Morbid obesity (HCC) -encouraged healthy eating habits as well as increasing activity 30 mins 5 days a week recommended   Next appt: 09/30/2018 Janene Harvey. Biagio Borg  [facility] [phone]    Follow Up Instructions:    I discussed the assessment and  treatment plan with the patient. The patient was provided an opportunity to ask questions and all were answered. The patient agreed with the plan and demonstrated an understanding of the instructions.   The patient was advised to call back or seek an in-person evaluation if the symptoms worsen or if the condition fails to improve as anticipated.  I provided 25 minutes of non-face-to-face time during this encounter.  avs printed and mailed.  Abbey Chatters, NP

## 2018-08-19 ENCOUNTER — Other Ambulatory Visit: Payer: Self-pay | Admitting: *Deleted

## 2018-08-19 MED ORDER — OXYCODONE-ACETAMINOPHEN 10-325 MG PO TABS: 1.0000 | ORAL_TABLET | Freq: Four times a day (QID) | ORAL | 0 refills | Status: DC | PRN

## 2018-08-19 NOTE — Telephone Encounter (Signed)
Patient requested refill [place] Verified LR: 07/22/2018 Pended Rx and sent to [facility] for approval.  

## 2018-09-21 ENCOUNTER — Other Ambulatory Visit: Payer: Self-pay | Admitting: *Deleted

## 2018-09-21 MED ORDER — OXYCODONE-ACETAMINOPHEN 10-325 MG PO TABS: 1.0000 | ORAL_TABLET | Freq: Four times a day (QID) | ORAL | 0 refills | Status: DC | PRN

## 2018-09-21 NOTE — Telephone Encounter (Signed)
Patient requested refill [place] Database Verified LR: 08/19/2018 Pended Rx and sent to [facility] for approval.

## 2018-09-30 ENCOUNTER — Encounter: Payer: Self-pay | Admitting: Family

## 2018-09-30 ENCOUNTER — Other Ambulatory Visit: Payer: Self-pay

## 2018-09-30 ENCOUNTER — Other Ambulatory Visit: Payer: Self-pay | Admitting: Family

## 2018-09-30 ENCOUNTER — Ambulatory Visit (INDEPENDENT_AMBULATORY_CARE_PROVIDER_SITE_OTHER): Payer: Medicare Other | Admitting: Family

## 2018-09-30 ENCOUNTER — Ambulatory Visit: Payer: Self-pay

## 2018-09-30 VITALS — BP 138/89 | HR 77 | Temp 97.4°F

## 2018-09-30 DIAGNOSIS — R05 Cough: Secondary | ICD-10-CM

## 2018-09-30 DIAGNOSIS — Z0000 Encounter for general adult medical examination without abnormal findings: Secondary | ICD-10-CM | POA: Diagnosis not present

## 2018-09-30 DIAGNOSIS — R059 Cough, unspecified: Secondary | ICD-10-CM

## 2018-09-30 DIAGNOSIS — E2839 Other primary ovarian failure: Secondary | ICD-10-CM

## 2018-09-30 MED ORDER — SHINGRIX 50 MCG/0.5ML IM SUSR: 0.5000 mL | Freq: Once | INTRAMUSCULAR | 0 refills | Status: DC

## 2018-09-30 MED ORDER — SHINGRIX 50 MCG/0.5ML IM SUSR: 0.5000 mL | Freq: Once | INTRAMUSCULAR | 0 refills | Status: AC

## 2018-09-30 MED ORDER — ALBUTEROL SULFATE HFA 108 (90 BASE) MCG/ACT IN AERS: INHALATION_SPRAY | RESPIRATORY_TRACT | 1 refills | Status: DC

## 2018-09-30 NOTE — Patient Instructions (Addendum)
Laura [name] , Thank you for taking time to come for your Medicare Wellness Visit. I appreciate your ongoing commitment to your health goals. Please review the following plan we discussed and let me know if I can assist you in the future.   Screening recommendations/referrals: Colonoscopy:Up to date due 05/20/2020 Mammogram:Declined due when COVID-19 restrictions are over.  Bone Density: ordered this visit Recommended yearly ophthalmology/optometry visit for glaucoma screening and checkup Recommended yearly dental visit for hygiene and checkup  Vaccinations: Influenza vaccine : Up to date  Pneumococcal vaccine Up to date  Tdap vaccine: Up to date due 10/06/2025  Shingles vaccine: Ordered this visit.Please get shot at the pharmacy.     Advanced directives: No   Conditions/risks identified: Smoker,Obesity   Next appointment: 1 year   Preventive Care 40-64 Years, Female Preventive care refers to lifestyle choices and visits with your health care provider that can promote health and wellness. What does preventive care include?  A yearly physical exam. This is also called an annual well check.  Dental exams once or twice a year.  Routine eye exams. Ask your health care provider how often you should have your eyes checked.  Personal lifestyle choices, including:  Daily care of your teeth and gums.  Regular physical activity.  Eating a healthy diet.  Avoiding tobacco and drug use.  Limiting alcohol use.  Practicing safe sex.  Taking low-dose aspirin daily starting at age 19.  Taking vitamin and mineral supplements as recommended by your health care provider. What happens during an annual well check? The services and screenings done by your health care provider during your annual well check will depend on your age, overall health, lifestyle risk factors, and family history of disease. Counseling  Your health care provider may ask you questions about your:  Alcohol use.   Tobacco use.  Drug use.  Emotional well-being.  Home and relationship well-being.  Sexual activity.  Eating habits.  Work and work Statistician.  Method of birth control.  Menstrual cycle.  Pregnancy history. Screening  You may have the following tests or measurements:  Height, weight, and BMI.  Blood pressure.  Lipid and cholesterol levels. These may be checked every 5 years, or more frequently if you are over 59 years old.  Skin check.  Lung cancer screening. You may have this screening every year starting at age 45 if you have a 30-pack-year history of smoking and currently smoke or have quit within the past 15 years.  Fecal occult blood test (FOBT) of the stool. You may have this test every year starting at age 85.  Flexible sigmoidoscopy or colonoscopy. You may have a sigmoidoscopy every 5 years or a colonoscopy every 10 years starting at age 21.  Hepatitis C blood test.  Hepatitis B blood test.  Sexually transmitted disease (STD) testing.  Diabetes screening. This is done by checking your blood sugar (glucose) after you have not eaten for a while (fasting). You may have this done every 1-3 years.  Mammogram. This may be done every 1-2 years. Talk to your health care provider about when you should start having regular mammograms. This may depend on whether you have a family history of breast cancer.  BRCA-related cancer screening. This may be done if you have a family history of breast, ovarian, tubal, or peritoneal cancers.  Pelvic exam and Pap test. This may be done every 3 years starting at age 54. Starting at age 39, this may be done every 5 years if  you have a Pap test in combination with an HPV test.  Bone density scan. This is done to screen for osteoporosis. You may have this scan if you are at high risk for osteoporosis. Discuss your test results, treatment options, and if necessary, the need for more tests with your health care provider. Vaccines   Your health care provider may recommend certain vaccines, such as:  Influenza vaccine. This is recommended every year.  Tetanus, diphtheria, and acellular pertussis (Tdap, Td) vaccine. You may need a Td booster every 10 years.  Zoster vaccine. You may need this after age 6.  Pneumococcal 13-valent conjugate (PCV13) vaccine. You may need this if you have certain conditions and were not previously vaccinated.  Pneumococcal polysaccharide (PPSV23) vaccine. You may need one or two doses if you smoke cigarettes or if you have certain conditions. Talk to your health care provider about which screenings and vaccines you need and how often you need them. This information is not intended to replace advice given to you by your health care provider. Make sure you discuss any questions you have with your health care provider. Document Released: 05/03/2015 Document Revised: 12/25/2015 Document Reviewed: 02/05/2015 Elsevier Interactive Patient Education  2017 [place] Prevention in the Home Falls can cause injuries. They can happen to people of all ages. There are many things you can do to make your home safe and to help prevent falls. What can I do on the outside of my home?  Regularly fix the edges of walkways and driveways and fix any cracks.  Remove anything that might make you trip as you walk through a door, such as a raised step or threshold.  Trim any bushes or trees on the path to your home.  Use bright outdoor lighting.  Clear any walking paths of anything that might make someone trip, such as rocks or tools.  Regularly check to see if handrails are loose or broken. Make sure that both sides of any steps have handrails.  Any raised decks and porches should have guardrails on the edges.  Have any leaves, snow, or ice cleared regularly.  Use sand or salt on walking paths during winter.  Clean up any spills in your garage right away. This includes oil or grease  spills. What can I do in the bathroom?  Use night lights.  Install grab bars by the toilet and in the tub and shower. Do not use towel bars as grab bars.  Use non-skid mats or decals in the tub or shower.  If you need to sit down in the shower, use a plastic, non-slip stool.  Keep the floor dry. Clean up any water that spills on the floor as soon as it happens.  Remove soap buildup in the tub or shower regularly.  Attach bath mats securely with double-sided non-slip rug tape.  Do not have throw rugs and other things on the floor that can make you trip. What can I do in the bedroom?  Use night lights.  Make sure that you have a light by your bed that is easy to reach.  Do not use any sheets or blankets that are too big for your bed. They should not hang down onto the floor.  Have a firm chair that has side arms. You can use this for support while you get dressed.  Do not have throw rugs and other things on the floor that can make you trip. What can I do in the  kitchen?  Clean up any spills right away.  Avoid walking on wet floors.  Keep items that you use a lot in easy-to-reach places.  If you need to reach something above you, use a strong step stool that has a grab bar.  Keep electrical cords out of the way.  Do not use floor polish or wax that makes floors slippery. If you must use wax, use non-skid floor wax.  Do not have throw rugs and other things on the floor that can make you trip. What can I do with my stairs?  Do not leave any items on the stairs.  Make sure that there are handrails on both sides of the stairs and use them. Fix handrails that are broken or loose. Make sure that handrails are as long as the stairways.  Check any carpeting to make sure that it is firmly attached to the stairs. Fix any carpet that is loose or worn.  Avoid having throw rugs at the top or bottom of the stairs. If you do have throw rugs, attach them to the floor with carpet  tape.  Make sure that you have a light switch at the top of the stairs and the bottom of the stairs. If you do not have them, ask someone to add them for you. What else can I do to help prevent falls?  Wear shoes that:  Do not have high heels.  Have rubber bottoms.  Are comfortable and fit you well.  Are closed at the toe. Do not wear sandals.  If you use a stepladder:  Make sure that it is fully opened. Do not climb a closed stepladder.  Make sure that both sides of the stepladder are locked into place.  Ask someone to hold it for you, if possible.  Clearly mark and make sure that you can see:  Any grab bars or handrails.  First and last steps.  Where the edge of each step is.  Use tools that help you move around (mobility aids) if they are needed. These include:  Canes.  Walkers.  Scooters.  Crutches.  Turn on the lights when you go into a dark area. Replace any light bulbs as soon as they burn out.  Set up your furniture so you have a clear path. Avoid moving your furniture around.  If any of your floors are uneven, fix them.  If there are any pets around you, be aware of where they are.  Review your medicines with your doctor. Some medicines can make you feel dizzy. This can increase your chance of falling. Ask your doctor what other things that you can do to help prevent falls. This information is not intended to replace advice given to you by your health care provider. Make sure you discuss any questions you have with your health care provider. Document Released: 01/31/2009 Document Revised: 09/12/2015 Document Reviewed: 05/11/2014 Elsevier Interactive Patient Education  2017 [employer].
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
## 2018-09-30 NOTE — Progress Notes (Signed)
   This service is provided via telemedicine  No vital signs collected/recorded due to the encounter was a telemedicine visit.   Location of patient (ex: home, work):  Home   Patient consents to a telephone visit:  Yes  Location of the provider (ex: office, home):  Office  Name of any referring provider:  [name] NP   Names of all persons participating in the telemedicine service and their role in the encounter:  Ruthell Rummage CMA, Marlowe Sax, NP, Marlowe Kays   Time spent on call:  Ruthell Rummage CMA, spent  Minutes on phone with patient

## 2018-10-24 ENCOUNTER — Other Ambulatory Visit: Payer: Self-pay | Admitting: *Deleted

## 2018-10-24 MED ORDER — OXYCODONE-ACETAMINOPHEN 10-325 MG PO TABS: 1.0000 | ORAL_TABLET | Freq: Four times a day (QID) | ORAL | 0 refills | Status: DC | PRN

## 2018-10-24 NOTE — Telephone Encounter (Addendum)
Patient called stating [facility] on [address] does not have Oxycodone in stock. Patient will not be able to get medication until Thursday. Patient is requesting that RX be submitted to the [facility] on [address] and [address].    I called [facility] [address] and they confirmed that RX is not in stock and RX can not be transferred because it is controlled

## 2018-10-24 NOTE — Addendum Note (Signed)
Addended by: Logan Bores on: 10/24/2018 02:07 PM   Modules accepted: Orders

## 2018-10-24 NOTE — Telephone Encounter (Signed)
Patient requested refill LR per Epic 09/21/2018  Pended Rx and sent to [facility] for approval.

## 2018-11-21 ENCOUNTER — Other Ambulatory Visit: Payer: Self-pay | Admitting: *Deleted

## 2018-11-21 MED ORDER — OXYCODONE-ACETAMINOPHEN 10-325 MG PO TABS: 1.0000 | ORAL_TABLET | Freq: Four times a day (QID) | ORAL | 0 refills | Status: DC | PRN

## 2018-11-21 NOTE — Telephone Encounter (Signed)
Patient requested Medication alittle early due to going out of town today due to sister dying. Patient will not be back before the 6th when the medication is due. Needs to pick up today and take it.   Last Epic RF: 10/24/2018 Pended Rx and sent to [facility] for approval.

## 2018-11-21 NOTE — Telephone Encounter (Signed)
Will approve at this time however next month she will need to wait additional days before requesting since she is getting this prescription early

## 2018-12-18 ENCOUNTER — Other Ambulatory Visit: Payer: Self-pay | Admitting: Nurse Practitioner

## 2018-12-18 DIAGNOSIS — R059 Cough, unspecified: Secondary | ICD-10-CM

## 2018-12-18 DIAGNOSIS — R05 Cough: Secondary | ICD-10-CM

## 2018-12-22 ENCOUNTER — Other Ambulatory Visit: Payer: Self-pay | Admitting: *Deleted

## 2018-12-22 MED ORDER — OXYCODONE-ACETAMINOPHEN 10-325 MG PO TABS: 1.0000 | ORAL_TABLET | Freq: Four times a day (QID) | ORAL | 0 refills | Status: DC | PRN

## 2018-12-22 NOTE — Telephone Encounter (Signed)
Patient requested refill Epic LR: 11/21/2018 Pended Rx and sent to [facility] for approval. Janett Billow out of office)

## 2019-01-04 ENCOUNTER — Ambulatory Visit: Payer: Medicare Other | Admitting: Nurse Practitioner

## 2019-01-17 ENCOUNTER — Encounter: Payer: Self-pay | Admitting: Gynecology

## 2019-01-18 ENCOUNTER — Encounter: Payer: Self-pay | Admitting: Nurse Practitioner

## 2019-01-18 ENCOUNTER — Other Ambulatory Visit: Payer: Self-pay

## 2019-01-18 ENCOUNTER — Ambulatory Visit (INDEPENDENT_AMBULATORY_CARE_PROVIDER_SITE_OTHER): Payer: Medicare Other | Admitting: Nurse Practitioner

## 2019-01-18 VITALS — BP 148/86 | HR 96 | Temp 97.8°F | Ht 65.0 in | Wt 225.2 lb

## 2019-01-18 DIAGNOSIS — Z23 Encounter for immunization: Secondary | ICD-10-CM | POA: Diagnosis not present

## 2019-01-18 DIAGNOSIS — I10 Essential (primary) hypertension: Secondary | ICD-10-CM

## 2019-01-18 DIAGNOSIS — D539 Nutritional anemia, unspecified: Secondary | ICD-10-CM

## 2019-01-18 DIAGNOSIS — M5441 Lumbago with sciatica, right side: Secondary | ICD-10-CM | POA: Diagnosis not present

## 2019-01-18 DIAGNOSIS — G8929 Other chronic pain: Secondary | ICD-10-CM

## 2019-01-18 DIAGNOSIS — Z720 Tobacco use: Secondary | ICD-10-CM

## 2019-01-18 DIAGNOSIS — F411 Generalized anxiety disorder: Secondary | ICD-10-CM

## 2019-01-18 MED ORDER — LOSARTAN POTASSIUM 25 MG PO TABS: 25.0000 mg | ORAL_TABLET | Freq: Every day | ORAL | 1 refills | Status: DC

## 2019-01-18 NOTE — Progress Notes (Signed)
Careteam: Patient Care Team: Sharon Seller, NP as PCP - General (Nurse Practitioner)  Advanced Directive information Does Patient Have a Medical Advance Directive?: No, Would patient like information on creating a medical advance directive?: Yes (MAU/Ambulatory/Procedural Areas - Information given)(Paperwork given at previous appointment)  Allergies  Allergen Reactions  . Aspirin Hives  . Penicillins Hives    Chief Complaint  Patient presents with  . Medical Management of Chronic Issues    4 month follow-up   . Quality Metric Gaps    Discuss need for mammogram   . Best Practice Recommendations    Discuss need for Hep C and HIV screening   . Immunizations    Discuss need for flu vaccine      HPI: Patient is a 58 y.o. female seen in the office today for follow up.   Tobacco abuse- continues to smoke 1/2 pack daily  ETOH abuse- reports not drinking daily any more due to COVID   Had scheduled mammogram but needs to reschedule. Not going now due to COVID.   Blood pressure at home has been elevated, eating a lot of snacky foods.   Chronic back pain- taking oxycodone 10-325 mg every 6 hours as needed. Using for severe pain. Will generally not take medication until it gets severe. Takes all medication that is prescribed. Not taking from outside source.  no side effects noted.   Review of Systems:  Review of Systems  Constitutional: Negative for chills, fever, malaise/fatigue and weight loss.  HENT: Negative for congestion and hearing loss.   Eyes: Negative for blurred vision.  Respiratory: Positive for cough, sputum production and wheezing. Negative for shortness of breath.   Cardiovascular: Negative for chest pain, palpitations and leg swelling.  Gastrointestinal: Negative for abdominal pain, blood in stool, constipation, diarrhea and melena.  Genitourinary: Negative for dysuria.  Musculoskeletal: Positive for back pain. Negative for falls and joint pain.   Skin: Negative for rash.  Neurological: Negative for dizziness and loss of consciousness.  Psychiatric/Behavioral: Negative for depression and memory loss. The patient is not nervous/anxious and does not have insomnia.     Past Medical History:  Diagnosis Date  . Acute bronchitis   . Cervical high risk HPV (human papillomavirus) test positive 12/2017   Negative subtype 16, 18/45  . Chronic back pain   . Complication of anesthesia    "didn't wake up"  . Cough   . Insomnia, unspecified   . Other malaise and fatigue   . Other specified diseases of blood and blood-forming organs(289.89)   . Tobacco use disorder    Past Surgical History:  Procedure Laterality Date  . ESOPHAGOGASTRODUODENOSCOPY N/A 03/17/2013   Procedure: ESOPHAGOGASTRODUODENOSCOPY (EGD);  Surgeon: Meryl Dare, MD;  Location: [facility] ENDOSCOPY;  Service: Endoscopy;  Laterality: N/A;  . FRACTURE SURGERY    . TONSILLECTOMY     Social History:   reports that she has been smoking cigarettes. She has a 6.00 pack-year smoking history. She has never used smokeless tobacco. She reports current alcohol use. She reports that she does not use drugs.  Family History  Problem Relation Age of Onset  . Arthritis Mother   . Hypertension Sister   . Hypertension Brother   . Hypertension Sister   . Hypertension Sister   . Hypertension Sister     Medications: Patient's Medications  New Prescriptions   No medications on file  Previous Medications   ALBUTEROL (VENTOLIN HFA) 108 (90 BASE) MCG/ACT INHALER    INHALE  2 PUFFS INTO THE LUNGS EVERY 6 HOURS AS NEEDED FOR WHEEZING OR SHORTNESS OF BREATH   FOLIC ACID (FOLVITE) 1 MG TABLET    Take 2 tablets (2 mg total) by mouth daily.   OXYCODONE-ACETAMINOPHEN (PERCOCET) 10-325 MG TABLET    Take 1 tablet by mouth every 6 (six) hours as needed for pain.  Modified Medications   No medications on file  Discontinued Medications   No medications on file    Physical Exam:  There were no  vitals filed for this visit. There is no height or weight on file to calculate BMI. Wt Readings from Last 3 Encounters:  04/11/18 223 lb (101.2 kg)  03/24/18 222 lb (100.7 kg)  01/06/18 219 lb (99.3 kg)    Physical Exam Vitals signs reviewed.  Constitutional:      Appearance: Normal appearance. She is obese.  HENT:     Head: Normocephalic and atraumatic.  Cardiovascular:     Rate and Rhythm: Normal rate and regular rhythm.  Pulmonary:     Effort: Pulmonary effort is normal. No respiratory distress.     Breath sounds: Normal breath sounds. No wheezing.  Abdominal:     General: Bowel sounds are normal.  Musculoskeletal: Normal range of motion.     Comments: Ambulates with cane  Neurological:     General: No focal deficit present.     Mental Status: She is alert and oriented to person, place, and time.  Psychiatric:        Mood and Affect: Mood normal.     Labs reviewed: Basic Metabolic Panel: Recent Labs    04/11/18 1351  NA 138  K 4.1  CL 97*  CO2 29  GLUCOSE 108  BUN 11  CREATININE 0.47*  CALCIUM 9.9   Liver Function Tests: Recent Labs    04/11/18 1351  AST 32  ALT 14  BILITOT 1.4*  PROT 7.5   No results for input(s): LIPASE, AMYLASE in the last 8760 hours. No results for input(s): AMMONIA in the last 8760 hours. CBC: Recent Labs    04/11/18 1351  WBC 4.4  NEUTROABS 3,111  HGB 14.2  HCT 40.8  MCV 107.7*  PLT 216   Lipid Panel: Recent Labs    04/11/18 1351  CHOL 237*  HDL 128  LDLCALC 92  TRIG 76  CHOLHDL 1.9   TSH: No results for input(s): TSH in the last 8760 hours. A1C: Lab Results  Component Value Date   HGBA1C 4.8 07/01/2015     Assessment/Plan 1. Essential hypertension -elevated, has had poor died, DASH diet given and encouraged diet changes. Will also start losartan as she is taking some medication she had at home to help bring blood pressure down. She will continue to monitor at home. Goal BP <140/90. - losartan (COZAAR)  25 MG tablet; Take 1 tablet (25 mg total) by mouth daily.  Dispense: 90 tablet; Refill: 1  2. Need for influenza vaccination - Flu Vaccine QUAD 6+ mos PF IM (Fluarix Quad PF)  3. Tobacco abuse Ongoing, encouraged smoking cessation  4. Chronic right-sided low back pain with right-sided sciatica Ongoing and stable. Continues on oxycodone-apap PRN severe pain.    5. Macrocytic anemia -continues on folic acid, declines follow up labs today  6. Generalized anxiety disorder Stable at this time. Not currently on medication  Next appt: 4 week televisit follow up bp  Carlos American. [place] [phone]

## 2019-01-18 NOTE — Patient Instructions (Addendum)
GOAL BP <140/90  Follow up in 4-6 weeks for blood pressure check. TELEVISIT  DASH Eating Plan DASH stands for "Dietary Approaches to Stop Hypertension." The DASH eating plan is a healthy eating plan that has been shown to reduce high blood pressure (hypertension). It may also reduce your risk for type 2 diabetes, heart disease, and stroke. The DASH eating plan may also help with weight loss. What are tips for following this plan?  General guidelines  Avoid eating more than 2,300 mg (milligrams) of salt (sodium) a day. If you have hypertension, you may need to reduce your sodium intake to 1,500 mg a day.  Limit alcohol intake to no more than 1 drink a day for nonpregnant women and 2 drinks a day for men. One drink equals 12 oz of beer, 5 oz of wine, or 1 oz of hard liquor.  Work with your health care provider to maintain a healthy body weight or to lose weight. Ask what an ideal weight is for you.  Get at least 30 minutes of exercise that causes your heart to beat faster (aerobic exercise) most days of the week. Activities may include walking, swimming, or biking.  Work with your health care provider or diet and nutrition specialist (dietitian) to adjust your eating plan to your individual calorie needs. Reading food labels   Check food labels for the amount of sodium per serving. Choose foods with less than 5 percent of the Daily Value of sodium. Generally, foods with less than 300 mg of sodium per serving fit into this eating plan.  To find whole grains, look for the word "whole" as the first word in the ingredient list. Shopping  Buy products labeled as "low-sodium" or "no salt added."  Buy fresh foods. Avoid canned foods and premade or frozen meals. Cooking  Avoid adding salt when cooking. Use salt-free seasonings or herbs instead of table salt or sea salt. Check with your health care provider or pharmacist before using salt substitutes.  Do not fry foods. Cook foods using  healthy methods such as baking, boiling, grilling, and broiling instead.  Cook with heart-healthy oils, such as olive, canola, soybean, or sunflower oil. Meal planning  Eat a balanced diet that includes: ? 5 or more servings of fruits and vegetables each day. At each meal, try to fill half of your plate with fruits and vegetables. ? Up to 6-8 servings of whole grains each day. ? Less than 6 oz of lean meat, poultry, or fish each day. A 3-oz serving of meat is about the same size as a deck of cards. One egg equals 1 oz. ? 2 servings of low-fat dairy each day. ? A serving of nuts, seeds, or beans 5 times each week. ? Heart-healthy fats. Healthy fats called Omega-3 fatty acids are found in foods such as flaxseeds and coldwater fish, like sardines, salmon, and mackerel.  Limit how much you eat of the following: ? Canned or prepackaged foods. ? Food that is high in trans fat, such as fried foods. ? Food that is high in saturated fat, such as fatty meat. ? Sweets, desserts, sugary drinks, and other foods with added sugar. ? Full-fat dairy products.  Do not salt foods before eating.  Try to eat at least 2 vegetarian meals each week.  Eat more home-cooked food and less restaurant, buffet, and fast food.  When eating at a restaurant, ask that your food be prepared with less salt or no salt, if possible. What foods are  recommended? The items listed may not be a complete list. Talk with your dietitian about what dietary choices are best for you. Grains Whole-grain or whole-wheat bread. Whole-grain or whole-wheat pasta. Brown rice. Modena Morrow. Bulgur. Whole-grain and low-sodium cereals. Pita bread. Low-fat, low-sodium crackers. Whole-wheat flour tortillas. Vegetables Fresh or frozen vegetables (raw, steamed, roasted, or grilled). Low-sodium or reduced-sodium tomato and vegetable juice. Low-sodium or reduced-sodium tomato sauce and tomato paste. Low-sodium or reduced-sodium canned  vegetables. Fruits All fresh, dried, or frozen fruit. Canned fruit in natural juice (without added sugar). Meat and other protein foods Skinless chicken or [place]. Ground chicken or [place]. Pork with fat trimmed off. Fish and seafood. Egg whites. Dried beans, peas, or lentils. Unsalted nuts, nut butters, and seeds. Unsalted canned beans. Lean cuts of beef with fat trimmed off. Low-sodium, lean deli meat. Dairy Low-fat (1%) or fat-free (skim) milk. Fat-free, low-fat, or reduced-fat cheeses. Nonfat, low-sodium ricotta or cottage cheese. Low-fat or nonfat yogurt. Low-fat, low-sodium cheese. Fats and oils Soft margarine without trans fats. Vegetable oil. Low-fat, reduced-fat, or light mayonnaise and salad dressings (reduced-sodium). Canola, safflower, olive, soybean, and sunflower oils. Avocado. Seasoning and other foods Herbs. Spices. Seasoning mixes without salt. Unsalted popcorn and pretzels. Fat-free sweets. What foods are not recommended? The items listed may not be a complete list. Talk with your dietitian about what dietary choices are best for you. Grains Baked goods made with fat, such as croissants, muffins, or some breads. Dry pasta or rice meal packs. Vegetables Creamed or fried vegetables. Vegetables in a cheese sauce. Regular canned vegetables (not low-sodium or reduced-sodium). Regular canned tomato sauce and paste (not low-sodium or reduced-sodium). Regular tomato and vegetable juice (not low-sodium or reduced-sodium). Angie Fava. Olives. Fruits Canned fruit in a light or heavy syrup. Fried fruit. Fruit in cream or butter sauce. Meat and other protein foods Fatty cuts of meat. Ribs. Fried meat. Berniece Salines. Sausage. Bologna and other processed lunch meats. Salami. Fatback. Hotdogs. Bratwurst. Salted nuts and seeds. Canned beans with added salt. Canned or smoked fish. Whole eggs or egg yolks. Chicken or [place] with skin. Dairy Whole or 2% milk, cream, and half-and-half. Whole or full-fat  cream cheese. Whole-fat or sweetened yogurt. Full-fat cheese. Nondairy creamers. Whipped toppings. Processed cheese and cheese spreads. Fats and oils Butter. Stick margarine. Lard. Shortening. Ghee. Bacon fat. Tropical oils, such as coconut, palm kernel, or palm oil. Seasoning and other foods Salted popcorn and pretzels. Onion salt, garlic salt, seasoned salt, table salt, and sea salt. Worcestershire sauce. Tartar sauce. Barbecue sauce. Teriyaki sauce. Soy sauce, including reduced-sodium. Steak sauce. Canned and packaged gravies. Fish sauce. Oyster sauce. Cocktail sauce. Horseradish that you find on the shelf. Ketchup. Mustard. Meat flavorings and tenderizers. Bouillon cubes. Hot sauce and Tabasco sauce. Premade or packaged marinades. Premade or packaged taco seasonings. Relishes. Regular salad dressings. Where to find more information:  National Heart, Lung, and [place]: https://wilson-eaton.com/  American Heart Association: www.heart.org Summary  The DASH eating plan is a healthy eating plan that has been shown to reduce high blood pressure (hypertension). It may also reduce your risk for type 2 diabetes, heart disease, and stroke.  With the DASH eating plan, you should limit salt (sodium) intake to 2,300 mg a day. If you have hypertension, you may need to reduce your sodium intake to 1,500 mg a day.  When on the DASH eating plan, aim to eat more fresh fruits and vegetables, whole grains, lean proteins, low-fat dairy, and heart-healthy fats.  Work with your health  care provider or diet and nutrition specialist (dietitian) to adjust your eating plan to your individual calorie needs. This information is not intended to replace advice given to you by your health care provider. Make sure you discuss any questions you have with your health care provider. Document Released: 03/26/2011 Document Revised: 03/19/2017 Document Reviewed: 03/30/2016 Elsevier Patient Education  2020 [employer].

## 2019-01-19 ENCOUNTER — Other Ambulatory Visit: Payer: Self-pay | Admitting: Nurse Practitioner

## 2019-01-19 DIAGNOSIS — D539 Nutritional anemia, unspecified: Secondary | ICD-10-CM

## 2019-01-19 DIAGNOSIS — I10 Essential (primary) hypertension: Secondary | ICD-10-CM

## 2019-01-19 DIAGNOSIS — E669 Obesity, unspecified: Secondary | ICD-10-CM

## 2019-01-20 ENCOUNTER — Other Ambulatory Visit: Payer: Self-pay | Admitting: *Deleted

## 2019-01-20 MED ORDER — OXYCODONE-ACETAMINOPHEN 10-325 MG PO TABS: 1.0000 | ORAL_TABLET | Freq: Four times a day (QID) | ORAL | 0 refills | Status: DC | PRN

## 2019-01-20 NOTE — Telephone Encounter (Signed)
Patient requested refill [facility] Verified LR: 12/22/2018 Pended Rx and sent to [place] for approval Janett Billow out of office.

## 2019-01-25 ENCOUNTER — Ambulatory Visit (INDEPENDENT_AMBULATORY_CARE_PROVIDER_SITE_OTHER): Payer: Medicare Other | Admitting: Nurse Practitioner

## 2019-01-25 ENCOUNTER — Encounter: Payer: Self-pay | Admitting: Nurse Practitioner

## 2019-01-25 ENCOUNTER — Other Ambulatory Visit: Payer: Self-pay

## 2019-01-25 DIAGNOSIS — J209 Acute bronchitis, unspecified: Secondary | ICD-10-CM | POA: Diagnosis not present

## 2019-01-25 MED ORDER — DOXYCYCLINE HYCLATE 100 MG PO TABS: 100.0000 mg | ORAL_TABLET | Freq: Two times a day (BID) | ORAL | 0 refills | Status: DC

## 2019-01-25 NOTE — Progress Notes (Signed)
This service is provided via telemedicine  No vital signs collected/recorded due to the encounter was a telemedicine visit.   Location of patient (ex: home, work):  Home  Patient consents to a telephone visit:  Yes  Location of the provider (ex: office, home):  [facility], Office   Name of any referring provider: N/A  Names of all persons participating in the telemedicine service and their role in the encounter: [name], Abbey Chatters, NP, and Patient   Time spent on call: 3 min with medical assistant     Careteam: Patient Care Team: Sharon Seller, NP as PCP - General (Geriatric Medicine)  Advanced Directive information    Allergies  Allergen Reactions  . Aspirin Hives  . Penicillins Hives    Chief Complaint  Patient presents with  . Acute Visit    Patient c/o shortness of breath and extreme fatigue       HPI: Patient is a 58 y.o. [name] via tele-visit.  Reports a few days after office visit started having fatigue, dry cough and body aches.  No exposure to COVID that she is aware of. She goes to store but wears mask, minimal contact with others. No fever but take oxycodone around the clock for chronic back pain- the oxycodone is helping the body aches and chills  Increase shortness of breath for the last 4 days. Went and got hair cut and by the time she got to the car she was extremely short of breath, had to use albuterol which was beneficial. No problems with breathing/wheezing/shortnes of breath when she is sitting. Very fatigue just after a few seconds of being active.    Continues Smoke.   Taking mucinex and Nyquil.   124/86 bp this morning.   Review of Systems:  Review of Systems  Constitutional: Positive for malaise/fatigue. Negative for chills and fever.  HENT: Negative for congestion and sinus pain.   Respiratory: Positive for cough, sputum production and shortness of breath. Negative for wheezing.   Cardiovascular: Negative  for chest pain and leg swelling.  Musculoskeletal: Positive for back pain and myalgias.  Neurological: Negative for dizziness, weakness and headaches.    Past Medical History:  Diagnosis Date  . Acute bronchitis   . Cervical high risk HPV (human papillomavirus) test positive 12/2017   Negative subtype 16, 18/45  . Chronic back pain   . Complication of anesthesia    "didn't wake up"  . Cough   . Insomnia, unspecified   . Other malaise and fatigue   . Other specified diseases of blood and blood-forming organs(289.89)   . Tobacco use disorder    Past Surgical History:  Procedure Laterality Date  . ESOPHAGOGASTRODUODENOSCOPY N/A 03/17/2013   Procedure: ESOPHAGOGASTRODUODENOSCOPY (EGD);  Surgeon: Meryl Dare, MD;  [facility] ENDOSCOPY;  Service: Endoscopy;  Laterality: N/A;  . FRACTURE SURGERY    . TONSILLECTOMY     Social History:   reports that she has been smoking cigarettes. She has a 6.00 pack-year smoking history. She has never used smokeless tobacco. She reports current alcohol use. She reports that she does not use drugs.  Family History  Problem Relation Age of Onset  . Arthritis Mother   . Hypertension Sister   . Hypertension Brother   . Hypertension Sister   . Hypertension Sister   . Hypertension Sister     Medications: Patient's Medications  New Prescriptions   No medications on file  Previous Medications   ALBUTEROL (VENTOLIN HFA)  108 (90 BASE) MCG/ACT INHALER    INHALE 2 PUFFS INTO THE LUNGS EVERY 6 HOURS AS NEEDED FOR WHEEZING OR SHORTNESS OF BREATH   FOLIC ACID (FOLVITE) 1 MG TABLET    Take 2 tablets (2 mg total) by mouth daily.   LOSARTAN (COZAAR) 25 MG TABLET    Take 1 tablet (25 mg total) by mouth daily.   OXYCODONE-ACETAMINOPHEN (PERCOCET) 10-325 MG TABLET    Take 1 tablet by mouth every 6 (six) hours as needed for pain.  Modified Medications   No medications on file  Discontinued Medications   No medications on file    Physical Exam:   There were no vitals filed for this visit. There is no height or weight on file to calculate BMI. Wt Readings from Last 3 Encounters:  01/18/19 225 lb 3.2 oz (102.2 kg)  04/11/18 223 lb (101.2 kg)  03/24/18 222 lb (100.7 kg)      Labs reviewed: Basic Metabolic Panel: Recent Labs    04/11/18 1351  NA 138  K 4.1  CL 97*  CO2 29  GLUCOSE 108  BUN 11  CREATININE 0.47*  CALCIUM 9.9   Liver Function Tests: Recent Labs    04/11/18 1351  AST 32  ALT 14  BILITOT 1.4*  PROT 7.5   No results for input(s): LIPASE, AMYLASE in the last 8760 hours. No results for input(s): AMMONIA in the last 8760 hours. CBC: Recent Labs    04/11/18 1351  WBC 4.4  NEUTROABS 3,111  HGB 14.2  HCT 40.8  MCV 107.7*  PLT 216   Lipid Panel: Recent Labs    04/11/18 1351  CHOL 237*  HDL 128  LDLCALC 92  TRIG 76  CHOLHDL 1.9   TSH: No results for input(s): TSH in the last 8760 hours. A1C: Lab Results  Component Value Date   HGBA1C 4.8 07/01/2015     Assessment/Plan 1. Acute bronchitis, unspecified organism -increase in cough, shortness of breath with activity and body aches.  -pt continues to smoke, advise cessation -encouraged to increase fluids mucin DM by mouth twice daily with full glass of water -can take additional tylenol 325 mg tablet every 6 hours to help with body aches (max of 3000 mg tylenol from all sources  - doxycycline (VIBRA-TABS) 100 MG tablet; Take 1 tablet (100 mg total) by mouth 2 (two) times daily.  Dispense: 14 tablet; Refill: 0 -education provided on when to seek care at the emergency room given such as worsening shortness of breath, extreme fatigue, confusion. Pt understands and in agreement.   Laura [name], AGNP  [facility] [phone]    Virtual Visit via Telephone Note  I connected with pt on 01/25/19 at 10:30 AM EDT by telephone and verified that I am speaking with the correct person using two identifiers.   Location: Patient: home Provider: office   I discussed the limitations, risks, security and privacy concerns of performing an evaluation and management service by telephone and the availability of in person appointments. I also discussed with the patient that there may be a patient responsible charge related to this service. The patient expressed understanding and agreed to proceed.   I discussed the assessment and treatment plan with the patient. The patient was provided an opportunity to ask questions and all were answered. The patient agreed with the plan and demonstrated an understanding of the instructions.   The patient was advised to call back or seek an in-person evaluation if  the symptoms worsen or if the condition fails to improve as anticipated.  I provided 13 minutes of non-face-to-face time during this encounter.  Carlos American. [facility] printed and mailed

## 2019-01-25 NOTE — Patient Instructions (Signed)
-  increase hydration -mucinex DM by mouth twice daily for 1 week with full glass of water -can use additional tylenol 325 mg by mouth every 6 hours as needed for body aches/pain (max of 3000 mg of tylenol from all sources) - to use doxycycline 100 mg by mouth twice daily with full glass of water if symptoms fail to improve (by the end of the week) with supportive care

## 2019-01-27 ENCOUNTER — Other Ambulatory Visit: Payer: Medicare Other

## 2019-02-08 ENCOUNTER — Encounter: Payer: Medicare Other | Admitting: Nurse Practitioner

## 2019-02-08 ENCOUNTER — Encounter: Payer: Self-pay | Admitting: Family

## 2019-02-08 ENCOUNTER — Other Ambulatory Visit: Payer: Self-pay

## 2019-02-08 ENCOUNTER — Ambulatory Visit (INDEPENDENT_AMBULATORY_CARE_PROVIDER_SITE_OTHER): Payer: Medicare Other | Admitting: Family

## 2019-02-08 VITALS — BP 138/82 | HR 85 | Temp 98.0°F | Ht 65.0 in | Wt 223.2 lb

## 2019-02-08 DIAGNOSIS — S40819A Abrasion of unspecified upper arm, initial encounter: Secondary | ICD-10-CM

## 2019-02-08 DIAGNOSIS — S0081XA Abrasion of other part of head, initial encounter: Secondary | ICD-10-CM

## 2019-02-08 DIAGNOSIS — S80819A Abrasion, unspecified lower leg, initial encounter: Secondary | ICD-10-CM | POA: Diagnosis not present

## 2019-02-08 DIAGNOSIS — W101XXA Fall (on)(from) sidewalk curb, initial encounter: Secondary | ICD-10-CM | POA: Diagnosis not present

## 2019-02-08 DIAGNOSIS — Z1231 Encounter for screening mammogram for malignant neoplasm of breast: Secondary | ICD-10-CM | POA: Diagnosis not present

## 2019-02-08 MED ORDER — TRIPLE ANTIBIOTIC 5-400-5000 EX OINT: TOPICAL_OINTMENT | Freq: Three times a day (TID) | CUTANEOUS | 0 refills | Status: AC

## 2019-02-08 NOTE — Progress Notes (Deleted)
This encounter was created in error - please disregard.

## 2019-02-08 NOTE — Progress Notes (Signed)
Provider: Lil Lepage FNP-C  Lauree Chandler, NP  Patient Care Team: Lauree Chandler, NP as PCP - General (Geriatric Medicine)  Extended Emergency Contact Information Primary Emergency Contact: Barb Merino of [place] Phone: [phone] Mobile Phone: [phone] Relation: Friend Secondary Emergency Contact: [place] Phone: [phone] Relation: Relative  Code Status:  Full Code  Goals of care: Advanced Directive information Advanced Directives 01/18/2019  Does Patient Have a Medical Advance Directive? No  Would patient like information on creating a medical advance directive? Yes (MAU/Ambulatory/Procedural Areas - Information given)  Pre-existing out of facility DNR order (yellow form or pink MOST form) -     Chief Complaint  Patient presents with   Acute Visit    [address] yesterday patient states he got dizzy but top of foot caught breeze way on concrete steps and fell. Patient sufferened laceration to face. Patient states they are in alot of pain.     HPI:  Pt is a 58 y.o. female seen today at [facility] office for an acute visit for evaluation of fall.He states fell yesterday on walk way outside his apartment.He states walker way is not repaired has irregular places.He states got dizzy and top of his foot got  caught on breeze way on concrete steps and fell.He landed on his hands.he states was assisted up.He denies hitting his head.He sustained laceration to face and hands.He complains of pain on abrasion areas.No loss of consciousness reported.He states had taken his medication that might have made him dizzy.He states checks his blood pressure at home states " runs about the same as readings done today by CMA". He denies any dizziness,headache,fatigue or palpitation.   Past Medical History:  Diagnosis Date   Acute bronchitis    Cervical high risk HPV (human papillomavirus) test positive 12/2017   Negative subtype 16, 18/45   Chronic back  pain    Complication of anesthesia    "didn't wake up"   Cough    Insomnia, unspecified    Other malaise and fatigue    Other specified diseases of blood and blood-forming organs(289.89)    Tobacco use disorder    Past Surgical History:  Procedure Laterality Date   ESOPHAGOGASTRODUODENOSCOPY N/A 03/17/2013   Procedure: ESOPHAGOGASTRODUODENOSCOPY (EGD);  Surgeon: Ladene Artist, MD;  Location: [facility] ENDOSCOPY;  Service: Endoscopy;  Laterality: N/A;   FRACTURE SURGERY     TONSILLECTOMY      Allergies  Allergen Reactions   Aspirin Hives   Penicillins Hives    Outpatient Encounter Medications as of 02/08/2019  Medication Sig   albuterol (VENTOLIN HFA) 108 (90 Base) MCG/ACT inhaler INHALE 2 PUFFS INTO THE LUNGS EVERY 6 HOURS AS NEEDED FOR WHEEZING OR SHORTNESS OF BREATH   folic acid (FOLVITE) 1 MG tablet Take 2 tablets (2 mg total) by mouth daily.   losartan (COZAAR) 25 MG tablet Take 1 tablet (25 mg total) by mouth daily.   oxyCODONE-acetaminophen (PERCOCET) 10-325 MG tablet Take 1 tablet by mouth every 6 (six) hours as needed for pain.   [DISCONTINUED] doxycycline (VIBRA-TABS) 100 MG tablet Take 1 tablet (100 mg total) by mouth 2 (two) times daily.   No facility-administered encounter medications on file as of 02/08/2019.     Review of Systems  Constitutional: Negative for appetite change, chills, fatigue and fever.  HENT: Negative for congestion, rhinorrhea, sinus pressure, sinus pain, sneezing and sore throat.   Eyes: Negative for discharge, redness and itching.  Respiratory: Negative for chest tightness, shortness of breath and  wheezing.        Chronic cough from smoking 1/2 pack per day.   Cardiovascular: Negative for chest pain, palpitations and leg swelling.  Gastrointestinal: Negative for abdominal distention, abdominal pain, constipation, diarrhea, nausea and vomiting.  Endocrine: Negative for polydipsia, polyphagia and polyuria.  Genitourinary: Negative  for difficulty urinating, dysuria, flank pain, frequency and urgency.  Musculoskeletal: Positive for arthralgias. Negative for gait problem.       Chronic hip pain   Skin: Negative for color change, pallor and rash.       abrasion to right side of his face and nose.right wrist less than a dime abrasion,other smaller abrasion to right thumb,5th finger and left hand 4th knuckle. No signs of infection or drainage noted.   Neurological: Negative for dizziness, weakness, light-headedness, numbness and headaches.  Hematological: Does not bruise/bleed easily.  Psychiatric/Behavioral: Negative for agitation and sleep disturbance. The patient is not nervous/anxious.     Immunization History  Administered Date(s) Administered   Influenza Whole 05/16/2012   Influenza,inj,Quad PF,6+ Mos 03/02/2013, 04/19/2014, 02/22/2015, 03/23/2016, 03/24/2018, 01/18/2019   Pneumococcal Conjugate-13 02/22/2015   Pneumococcal Polysaccharide-23 03/02/2013   Tdap 10/07/2015   Pertinent  Health Maintenance Due  Topic Date Due   MAMMOGRAM  09/08/2012   COLONOSCOPY  05/20/2020 (Originally 09/08/2012)   PAP SMEAR-Modifier  01/06/2021   INFLUENZA VACCINE  Completed   Fall Risk  01/25/2019 01/18/2019 09/30/2018 09/30/2018 04/11/2018  Falls in the past year? 0 0 1 0 0  Number falls in past yr: 0 0 0 0 0  Injury with Fall? 0 0 0 0 0  Follow up - - Falls prevention discussed - -    Vitals:   02/08/19 1537  BP: 138/82  Pulse: 85  Temp: 98 F (36.7 C)  TempSrc: Temporal  SpO2: 93%  Weight: 223 lb 3.2 oz (101.2 kg)  Height: 5\' 5"  (1.651 m)   Body mass index is 37.14 kg/m. Physical Exam Constitutional:      General: She is not in acute distress.    Appearance: She is obese. She is not ill-appearing.  HENT:     Head: Normocephalic.     Right Ear: Tympanic membrane, ear canal and external ear normal. There is no impacted cerumen.     Left Ear: Tympanic membrane, ear canal and external ear normal. There is  no impacted cerumen.     Nose: Nose normal. No congestion or rhinorrhea.     Mouth/Throat:     Mouth: Mucous membranes are moist.     Pharynx: Oropharynx is clear. No oropharyngeal exudate or posterior oropharyngeal erythema.  Eyes:     General: No scleral icterus.       Right eye: No discharge.        Left eye: No discharge.     Extraocular Movements: Extraocular movements intact.     Conjunctiva/sclera: Conjunctivae normal.     Pupils: Pupils are equal, round, and reactive to light.  Neck:     Musculoskeletal: Normal range of motion. No neck rigidity or muscular tenderness.     Vascular: No carotid bruit.  Cardiovascular:     Rate and Rhythm: Normal rate and regular rhythm.     Pulses: Normal pulses.     Heart sounds: Normal heart sounds. No murmur. No friction rub. No gallop.   Pulmonary:     Effort: Pulmonary effort is normal. No respiratory distress.     Breath sounds: Normal breath sounds. No wheezing, rhonchi or rales.  Chest:  Chest wall: No tenderness.  Abdominal:     General: Bowel sounds are normal. There is no distension.     Palpations: Abdomen is soft. There is no mass.     Tenderness: There is no abdominal tenderness. There is no right CVA tenderness, left CVA tenderness, guarding or rebound.  Musculoskeletal: Normal range of motion.        General: No swelling or tenderness.     Right lower leg: No edema.     Left lower leg: No edema.  Lymphadenopathy:     Cervical: No cervical adenopathy.  Skin:    General: Skin is warm.     Coloration: Skin is not pale.     Findings: No erythema or rash.     Comments: abrasion to right side of his face and nose.right wrist less than a dime abrasion,other smaller abrasion to right thumb,5th finger and left hand 4th finger  knuckle. No signs of infection or drainage noted.   Neurological:     Mental Status: She is alert and oriented to person, place, and time.     Cranial Nerves: No cranial nerve deficit.     Sensory: No  sensory deficit.     Motor: No weakness.     Coordination: Coordination normal.     Gait: Gait normal.  Psychiatric:        Mood and Affect: Mood normal.        Behavior: Behavior normal.        Thought Content: Thought content normal.        Judgment: Judgment normal.    Labs reviewed: Recent Labs    04/11/18 1351  NA 138  K 4.1  CL 97*  CO2 29  GLUCOSE 108  BUN 11  CREATININE 0.47*  CALCIUM 9.9   Recent Labs    04/11/18 1351  AST 32  ALT 14  BILITOT 1.4*  PROT 7.5   Recent Labs    04/11/18 1351  WBC 4.4  NEUTROABS 3,111  HGB 14.2  HCT 40.8  MCV 107.7*  PLT 216   Lab Results  Component Value Date   TSH 3.93 08/10/2017   Lab Results  Component Value Date   HGBA1C 4.8 07/01/2015   Lab Results  Component Value Date   CHOL 237 (H) 04/11/2018   HDL 128 04/11/2018   LDLCALC 92 04/11/2018   TRIG 76 04/11/2018   CHOLHDL 1.9 04/11/2018    Significant Diagnostic Results in last 30 days:  No results found.  Assessment/Plan 1. Abrasion of face and extremities, initial encounter Afebrile.No signs of infections on abrasion sites.instructed to cleanse abrasion on fingers and wrist area with soap and warm water,pat dry and apply Neosporin ointment.   - neomycin-bacitracin-polymyxin (NEOSPORIN) [phone] ointment; Apply topically 3 (three) times daily for 7 days. apply to abrasion areas on both hands  Dispense: 28.3 g; Refill: 0  2. Fall (on)(from) sidewalk curb, initial encounter Vital signs stable.Had dizziness foot got caught on concrete sustaining fall.No injuries to foot.sustained above abrasion.He did not hit his head, hips or knees.fall and safety precaution discussed.Has schedule lab work.     3. Breast cancer screening by mammogram Due for mammogram. - MM Digital Screening; Future  Family/ staff Communication: Reviewed plan of care with patient.  Labs/tests ordered:  - MM Digital Screening; Future  Caesar Bookman, NP

## 2019-02-09 NOTE — Progress Notes (Signed)
This encounter was created in error - please disregard.

## 2019-02-15 ENCOUNTER — Encounter: Payer: Self-pay | Admitting: Nurse Practitioner

## 2019-02-15 ENCOUNTER — Other Ambulatory Visit: Payer: Self-pay

## 2019-02-15 ENCOUNTER — Ambulatory Visit (INDEPENDENT_AMBULATORY_CARE_PROVIDER_SITE_OTHER): Payer: Medicare Other | Admitting: Nurse Practitioner

## 2019-02-15 DIAGNOSIS — F419 Anxiety disorder, unspecified: Secondary | ICD-10-CM

## 2019-02-15 DIAGNOSIS — S80819A Abrasion, unspecified lower leg, initial encounter: Secondary | ICD-10-CM

## 2019-02-15 DIAGNOSIS — Z720 Tobacco use: Secondary | ICD-10-CM | POA: Diagnosis not present

## 2019-02-15 DIAGNOSIS — S0081XA Abrasion of other part of head, initial encounter: Secondary | ICD-10-CM | POA: Diagnosis not present

## 2019-02-15 DIAGNOSIS — M25561 Pain in right knee: Secondary | ICD-10-CM

## 2019-02-15 DIAGNOSIS — M25562 Pain in left knee: Secondary | ICD-10-CM

## 2019-02-15 DIAGNOSIS — S40819A Abrasion of unspecified upper arm, initial encounter: Secondary | ICD-10-CM

## 2019-02-15 DIAGNOSIS — I10 Essential (primary) hypertension: Secondary | ICD-10-CM

## 2019-02-15 MED ORDER — DULOXETINE HCL 30 MG PO CPEP: 30.0000 mg | ORAL_CAPSULE | Freq: Every day | ORAL | 1 refills | Status: DC

## 2019-02-15 NOTE — Patient Instructions (Addendum)

## 2019-02-15 NOTE — Progress Notes (Signed)
This service is provided via telemedicine  No vital signs collected/recorded due to the encounter was a telemedicine visit.   Location of patient (ex: home, work):  Home  Patient consents to a telephone visit:  Yes  Location of the provider (ex: office, home):  [facility], Office   Name of any referring provider: N/A  Names of all persons participating in the telemedicine service and their role in the encounter: [name], Abbey ChattersJessica Dia Donate, NP, and Patient   Time spent on call: 5 min with medical assistant      Careteam: Patient Care Team: Sharon SellerEubanks, Nahum Sherrer K, NP as PCP - General (Geriatric Medicine)  Advanced Directive information Does Patient Have a Medical Advance Directive?: No, Would patient like information on creating a medical advance directive?: Yes (MAU/Ambulatory/Procedural Areas - Information given)(Given at previous visit)  Allergies  Allergen Reactions  . Aspirin Hives  . Penicillins Hives    Chief Complaint  Patient presents with  . Acute Visit    Elevated blood pressure. 131/64, Telephone visit.      HPI: Patient is a 58 y.o. female for televisit Started losartan 1 month ago due to elevated blood pressure.  Overall blood pressure has improved.  131/64 is average blood pressure now.  No side effects noted from medication. No headache, dizziness, light headedness.  Trying to cut back on snacky and fried food.   Feeling better after fall- no signs of infection to hand or face   Started coughing again. Still smoking. Continues to have shortness of breath with minimal activity.  Taking mucinex DM.   Plans to get mammogram   Sleep- having more of an issue with sleep- using melatonin which has not been effective.   Has not drank any alcohol in 2.5 weeks- worse when he goes to parties, things get "out of hand"   Reports increase pain to both knees. Holding fluid. Has been giving    Review of Systems:  Review of Systems   Constitutional: Negative for chills, fever and weight loss.  HENT: Negative for tinnitus.   Respiratory: Negative for cough, sputum production and shortness of breath.   Cardiovascular: Negative for chest pain, palpitations and leg swelling.  Gastrointestinal: Negative for abdominal pain, constipation, diarrhea and heartburn.  Genitourinary: Negative for dysuria, frequency and urgency.  Musculoskeletal: Positive for falls and joint pain. Negative for back pain and myalgias.  Skin: Negative.   Neurological: Negative for dizziness and headaches.  Psychiatric/Behavioral: Negative for depression and memory loss. The patient is nervous/anxious and has insomnia.     Past Medical History:  Diagnosis Date  . Acute bronchitis   . Cervical high risk HPV (human papillomavirus) test positive 12/2017   Negative subtype 16, 18/45  . Chronic back pain   . Complication of anesthesia    "didn't wake up"  . Cough   . Insomnia, unspecified   . Other malaise and fatigue   . Other specified diseases of blood and blood-forming organs(289.89)   . Tobacco use disorder    Past Surgical History:  Procedure Laterality Date  . ESOPHAGOGASTRODUODENOSCOPY N/A 03/17/2013   Procedure: ESOPHAGOGASTRODUODENOSCOPY (EGD);  Surgeon: Meryl DareMalcolm T Stark, MD;  Location: [facility] ENDOSCOPY;  Service: Endoscopy;  Laterality: N/A;  . FRACTURE SURGERY    . TONSILLECTOMY     Social History:   reports that she has been smoking cigarettes. She has a 6.00 pack-year smoking history. She has never used smokeless tobacco. She reports current alcohol use. She reports that she does  not use drugs.  Family History  Problem Relation Age of Onset  . Arthritis Mother   . Hypertension Sister   . Hypertension Brother   . Hypertension Sister   . Hypertension Sister   . Hypertension Sister     Medications: Patient's Medications  New Prescriptions   No medications on file  Previous Medications   ACETAMINOPHEN (TYLENOL) 500 MG TABLET     Take 500 mg by mouth as needed.   ALBUTEROL (VENTOLIN HFA) 108 (90 BASE) MCG/ACT INHALER    INHALE 2 PUFFS INTO THE LUNGS EVERY 6 HOURS AS NEEDED FOR WHEEZING OR SHORTNESS OF BREATH   FOLIC ACID (FOLVITE) 1 MG TABLET    Take 2 tablets (2 mg total) by mouth daily.   LOSARTAN (COZAAR) 25 MG TABLET    Take 1 tablet (25 mg total) by mouth daily.   NEOMYCIN-BACITRACIN-POLYMYXIN (NEOSPORIN) [phone] OINTMENT    Apply topically 3 (three) times daily for 7 days. apply to abrasion areas on both hands   OXYCODONE-ACETAMINOPHEN (PERCOCET) 10-325 MG TABLET    Take 1 tablet by mouth every 6 (six) hours as needed for pain.  Modified Medications   No medications on file  Discontinued Medications   No medications on file    Physical Exam:  There were no vitals filed for this visit. There is no height or weight on file to calculate BMI. Wt Readings from Last 3 Encounters:  02/08/19 223 lb 3.2 oz (101.2 kg)  01/18/19 225 lb 3.2 oz (102.2 kg)  04/11/18 223 lb (101.2 kg)   Labs reviewed: Basic Metabolic Panel: Recent Labs    04/11/18 1351  NA 138  K 4.1  CL 97*  CO2 29  GLUCOSE 108  BUN 11  CREATININE 0.47*  CALCIUM 9.9   Liver Function Tests: Recent Labs    04/11/18 1351  AST 32  ALT 14  BILITOT 1.4*  PROT 7.5   No results for input(s): LIPASE, AMYLASE in the last 8760 hours. No results for input(s): AMMONIA in the last 8760 hours. CBC: Recent Labs    04/11/18 1351  WBC 4.4  NEUTROABS 3,111  HGB 14.2  HCT 40.8  MCV 107.7*  PLT 216   Lipid Panel: Recent Labs    04/11/18 1351  CHOL 237*  HDL 128  LDLCALC 92  TRIG 76  CHOLHDL 1.9   TSH: No results for input(s): TSH in the last 8760 hours. A1C: Lab Results  Component Value Date   HGBA1C 4.8 07/01/2015     Assessment/Plan 1. Tobacco abuse Encouraged smoking cessation.   2. Abrasion of face and extremities, initial encounter -doing well after fall. Reports areas are healing well without signs of infection.   3. Essential hypertension -improved on losartan, plans to update blood work later this week. Continue current regimen with dietary modifications.   4. Anxiety -making it harder for her to sleep due to increase anxiety. Agreeable to start cymbalta at this time, previously did not start when recommended because he thought it was a different medication. - DULoxetine (CYMBALTA) 30 MG capsule; Take 1 capsule (30 mg total) by mouth daily.  Dispense: 30 capsule; Refill: 1  5. Acute pain of both knees - pain and swelling of both knees that has progressively worsened -AMB referral to orthopedics for further evaluation   Next appt: 1 month on mood.  Carlos American. Harle Battiest  [facility] [phone]    Virtual Visit via Telephone Note  I connected with pt  on 02/15/19 at 11:30 AM EDT by telephone and verified that I am speaking with the correct person using two identifiers.  Location: Patient: home Provider: office   I discussed the limitations, risks, security and privacy concerns of performing an evaluation and management service by telephone and the availability of in person appointments. I also discussed with the patient that there may be a patient responsible charge related to this service. The patient expressed understanding and agreed to proceed.   I discussed the assessment and treatment plan with the patient. The patient was provided an opportunity to ask questions and all were answered. The patient agreed with the plan and demonstrated an understanding of the instructions.   The patient was advised to call back or seek an in-person evaluation if the symptoms worsen or if the condition fails to improve as anticipated.  I provided 16 minutes of non-face-to-face time during this encounter.  Janene Harvey. Biagio Borg Avs printed and mailed

## 2019-02-17 ENCOUNTER — Other Ambulatory Visit: Payer: Medicare Other

## 2019-02-17 ENCOUNTER — Other Ambulatory Visit: Payer: Self-pay

## 2019-02-17 DIAGNOSIS — D539 Nutritional anemia, unspecified: Secondary | ICD-10-CM

## 2019-02-17 DIAGNOSIS — E669 Obesity, unspecified: Secondary | ICD-10-CM

## 2019-02-17 DIAGNOSIS — I10 Essential (primary) hypertension: Secondary | ICD-10-CM

## 2019-02-18 LAB — COMPLETE METABOLIC PANEL WITH GFR
AG Ratio: 1.4 (calc) (ref 1.0–2.5)
ALT: 14 U/L (ref 6–29)
AST: 22 U/L (ref 10–35)
Albumin: 4 g/dL (ref 3.6–5.1)
Alkaline phosphatase (APISO): 97 U/L (ref 37–153)
BUN: 8 mg/dL (ref 7–25)
CO2: 25 mmol/L (ref 20–32)
Calcium: 9.4 mg/dL (ref 8.6–10.4)
Chloride: 103 mmol/L (ref 98–110)
Creat: 0.59 mg/dL (ref 0.50–1.05)
GFR, Est African American: 119 mL/min/{1.73_m2} (ref >=60)
GFR, Est Non African American: 102 mL/min/{1.73_m2} (ref >=60)
Globulin: 2.8 g/dL (calc) (ref 1.9–3.7)
Glucose, Bld: 95 mg/dL (ref 65–99)
Potassium: 3.7 mmol/L (ref 3.5–5.3)
Sodium: 137 mmol/L (ref 135–146)
Total Bilirubin: 0.8 mg/dL (ref 0.2–1.2)
Total Protein: 6.8 g/dL (ref 6.1–8.1)

## 2019-02-18 LAB — LIPID PANEL
Cholesterol: 184 mg/dL (ref <200)
HDL: 93 mg/dL (ref >=50)
LDL Cholesterol (Calc): 76 mg/dL (calc)
Non-HDL Cholesterol (Calc): 91 mg/dL (calc) (ref <130)
Total CHOL/HDL Ratio: 2 (calc) (ref <5.0)
Triglycerides: 71 mg/dL (ref <150)

## 2019-02-18 LAB — CBC WITH DIFFERENTIAL/PLATELET
Absolute Monocytes: 531 cells/uL (ref 200–950)
Basophils Absolute: 59 cells/uL (ref 0–200)
Basophils Relative: 1.3 %
Eosinophils Absolute: 108 cells/uL (ref 15–500)
Eosinophils Relative: 2.4 %
HCT: 36.8 % (ref 35.0–45.0)
Hemoglobin: 13.1 g/dL (ref 11.7–15.5)
Lymphs Abs: 1112 cells/uL (ref 850–3900)
MCH: 39.9 pg — ABNORMAL HIGH (ref 27.0–33.0)
MCHC: 35.6 g/dL (ref 32.0–36.0)
MCV: 112.2 fL — ABNORMAL HIGH (ref 80.0–100.0)
MPV: 11.8 fL (ref 7.5–12.5)
Monocytes Relative: 11.8 %
Neutro Abs: 2691 cells/uL (ref 1500–7800)
Neutrophils Relative %: 59.8 %
Platelets: 154 10*3/uL (ref 140–400)
RBC: 3.28 10*6/uL — ABNORMAL LOW (ref 3.80–5.10)
RDW: 12 % (ref 11.0–15.0)
Total Lymphocyte: 24.7 %
WBC: 4.5 10*3/uL (ref 3.8–10.8)

## 2019-02-18 LAB — FOLATE: Folate: >24 ng/mL

## 2019-02-21 ENCOUNTER — Other Ambulatory Visit: Payer: Self-pay | Admitting: *Deleted

## 2019-02-21 MED ORDER — OXYCODONE-ACETAMINOPHEN 10-325 MG PO TABS: 1.0000 | ORAL_TABLET | Freq: Four times a day (QID) | ORAL | 0 refills | Status: DC | PRN

## 2019-02-21 NOTE — Telephone Encounter (Signed)
Patient requested refill [place] Verified LR: 01/20/2019 Pended Rx and sent to [facility] for approval.

## 2019-02-22 ENCOUNTER — Ambulatory Visit: Payer: Self-pay

## 2019-02-22 ENCOUNTER — Other Ambulatory Visit: Payer: Self-pay

## 2019-02-22 ENCOUNTER — Ambulatory Visit (INDEPENDENT_AMBULATORY_CARE_PROVIDER_SITE_OTHER): Payer: Medicare Other | Admitting: Orthopaedic Surgery

## 2019-02-22 ENCOUNTER — Encounter: Payer: Self-pay | Admitting: Orthopaedic Surgery

## 2019-02-22 DIAGNOSIS — M25561 Pain in right knee: Secondary | ICD-10-CM | POA: Diagnosis not present

## 2019-02-22 DIAGNOSIS — M25562 Pain in left knee: Secondary | ICD-10-CM | POA: Diagnosis not present

## 2019-02-22 DIAGNOSIS — G8929 Other chronic pain: Secondary | ICD-10-CM

## 2019-02-22 MED ORDER — LIDOCAINE HCL 1 % IJ SOLN
3.0000 mL | INTRAMUSCULAR | Status: AC | PRN
  Administered 2019-02-22: 3 mL

## 2019-02-22 MED ORDER — METHYLPREDNISOLONE ACETATE 40 MG/ML IJ SUSP
13.3300 mg | INTRAMUSCULAR | Status: AC | PRN
  Administered 2019-02-22: 13.33 mg via INTRA_ARTICULAR

## 2019-02-22 MED ORDER — BUPIVACAINE HCL 0.25 % IJ SOLN
0.6600 mL | INTRAMUSCULAR | Status: AC | PRN
  Administered 2019-02-22: .66 mL via INTRA_ARTICULAR
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
## 2019-03-06 ENCOUNTER — Telehealth: Payer: Self-pay

## 2019-03-06 NOTE — Telephone Encounter (Signed)
Patient called and asked to speak to Janett Billow I informed there that Janett Billow was busy but could I take a message she said no she needed to speak to Coleta spoke with Janett Billow and she said if she could not leave the message with me that she could schedule a Tele visit with her for the morning I called the patient back and made an Tele visit for tomorrow morning

## 2019-03-07 ENCOUNTER — Ambulatory Visit (INDEPENDENT_AMBULATORY_CARE_PROVIDER_SITE_OTHER): Payer: Medicare Other | Admitting: Nurse Practitioner

## 2019-03-07 ENCOUNTER — Other Ambulatory Visit: Payer: Self-pay

## 2019-03-07 ENCOUNTER — Encounter: Payer: Self-pay | Admitting: Nurse Practitioner

## 2019-03-07 VITALS — Ht 65.0 in | Wt 223.2 lb

## 2019-03-07 DIAGNOSIS — G8929 Other chronic pain: Secondary | ICD-10-CM

## 2019-03-07 DIAGNOSIS — M5441 Lumbago with sciatica, right side: Secondary | ICD-10-CM | POA: Diagnosis not present

## 2019-03-07 NOTE — Progress Notes (Signed)
Careteam: Patient Care Team: Sharon SellerEubanks, Jessica K, NP as PCP - General (Geriatric Medicine)  Advanced Directive information    Allergies  Allergen Reactions  . Aspirin Hives  . Penicillins Hives    Chief Complaint  Patient presents with  . Acute Visit    Back pain, worse than usual, runs down right leg     HPI: Patient is a 58 y.o. female via tele-visit. Reports having more pain to lower back, running down right leg. Previously this did not happen until standing for extended periods but now happening all the time. Did not have any increase in back pain with her recent fall.  On 02/07/2019 fell on the sidewalk outside apartment complex. Did not have any increase in pain to back at that time. No loss of control of bowels or bladder.  The pain makes it harder to walk but reports he is able to walk without foot drop or weakness.  Requesting to increase pain medication.  Had been previously seeing a specialist for back pain and getting injections but stopped this "I don't know why he quit giving them to me" Pain generally 8/10 now at a 10/10  Review of Systems:  Review of Systems  Constitutional: Negative for chills and fever.  Musculoskeletal: Positive for back pain, falls, joint pain and myalgias. Negative for neck pain.  Skin: Negative for itching and rash.  Neurological: Positive for tingling and sensory change. Negative for speech change, focal weakness, weakness and headaches.    Past Medical History:  Diagnosis Date  . Acute bronchitis   . Cervical high risk HPV (human papillomavirus) test positive 12/2017   Negative subtype 16, 18/45  . Chronic back pain   . Complication of anesthesia    "didn't wake up"  . Cough   . Insomnia, unspecified   . Other malaise and fatigue   . Other specified diseases of blood and blood-forming organs(289.89)   . Tobacco use disorder    Past Surgical History:  Procedure Laterality Date  . ESOPHAGOGASTRODUODENOSCOPY N/A  03/17/2013   Procedure: ESOPHAGOGASTRODUODENOSCOPY (EGD);  Surgeon: Meryl DareMalcolm T Stark, MD;  [facility];  Service: Endoscopy;  Laterality: N/A;  . FRACTURE SURGERY    . TONSILLECTOMY     Social History:   reports that she has been smoking cigarettes. She has a 6.00 pack-year smoking history. She has never used smokeless tobacco. She reports current alcohol use. She reports that she does not use drugs.  Family History  Problem Relation Age of Onset  . Arthritis Mother   . Hypertension Sister   . Hypertension Brother   . Hypertension Sister   . Hypertension Sister   . Hypertension Sister     Medications: Patient's Medications  New Prescriptions   No medications on file  Previous Medications   ACETAMINOPHEN (TYLENOL) 500 MG TABLET    Take 500 mg by mouth as needed.   ALBUTEROL (VENTOLIN HFA) 108 (90 BASE) MCG/ACT INHALER    INHALE 2 PUFFS INTO THE LUNGS EVERY 6 HOURS AS NEEDED FOR WHEEZING OR SHORTNESS OF BREATH   DULOXETINE (CYMBALTA) 30 MG CAPSULE    Take 1 capsule (30 mg total) by mouth daily.   FOLIC ACID (FOLVITE) 1 MG TABLET    Take 1 mg by mouth daily.   LOSARTAN (COZAAR) 25 MG TABLET    Take 1 tablet (25 mg total) by mouth daily.   OXYCODONE-ACETAMINOPHEN (PERCOCET) 10-325 MG TABLET    Take 1 tablet by mouth every 6 (six) hours  as needed for pain.  Modified Medications   No medications on file  Discontinued Medications   FOLIC ACID (FOLVITE) 1 MG TABLET    Take 2 tablets (2 mg total) by mouth daily.    Physical Exam:  Vitals:   03/07/19 1152  Weight: 223 lb 3.2 oz (101.2 kg)  Height: 5\' 5"  (1.651 m)   Body mass index is 37.14 kg/m. Wt Readings from Last 3 Encounters:  03/07/19 223 lb 3.2 oz (101.2 kg)  02/08/19 223 lb 3.2 oz (101.2 kg)  01/18/19 225 lb 3.2 oz (102.2 kg)   Labs reviewed: Basic Metabolic Panel: Recent Labs    04/11/18 1351 02/17/19 1102  NA 138 137  K 4.1 3.7  CL 97* 103  CO2 29 25  GLUCOSE 108 95  BUN 11 8  CREATININE 0.47*  0.59  CALCIUM 9.9 9.4   Liver Function Tests: Recent Labs    04/11/18 1351 02/17/19 1102  AST 32 22  ALT 14 14  BILITOT 1.4* 0.8  PROT 7.5 6.8   No results for input(s): LIPASE, AMYLASE in the last 8760 hours. No results for input(s): AMMONIA in the last 8760 hours. CBC: Recent Labs    04/11/18 1351 02/17/19 1102  WBC 4.4 4.5  NEUTROABS 3,111 2,691  HGB 14.2 13.1  HCT 40.8 36.8  MCV 107.7* 112.2*  PLT 216 154   Lipid Panel: Recent Labs    04/11/18 1351 02/17/19 1102  CHOL 237* 184  HDL 128 93  LDLCALC 92 76  TRIG 76 71  CHOLHDL 1.9 2.0   TSH: No results for input(s): TSH in the last 8760 hours. A1C: Lab Results  Component Value Date   HGBA1C 4.8 07/01/2015     Assessment/Plan 1. Chronic right-sided low back pain with right-sided sciatica -worsening back pain, had a fall last month. No recent xrays on file, will update at this time -discouraged use of increasing frequency of pain medication as this can add to increase side effect and increase risk for accidental OD/ respiratory depression. Pt aware.  -can add additional regular strength tylenol 325 mg to oxycodone/apap 10-325 mg every 6 hours as needed pain -may need referral to neurosurgery for further evaluation -to use heating pad to back 3 times daily with muscle rub AFTER heat. - DG Lumbar Spine Complete; Future for further evaluation.   Next appt: 03/20/2019 as scheduled.  03/22/2019. Janene Harvey  [facility] [phone]    Virtual Visit via Telephone Note  I connected with pt on 03/07/19 at 11:00 AM EST by telephone and verified that I am speaking with the correct person using two identifiers.  Location: Patient: home Provider: [facility]   I discussed the limitations, risks, security and privacy concerns of performing an evaluation and management service by telephone and the availability of in person appointments. I also discussed with the patient that  there may be a patient responsible charge related to this service. The patient expressed understanding and agreed to proceed.   I discussed the assessment and treatment plan with the patient. The patient was provided an opportunity to ask questions and all were answered. The patient agreed with the plan and demonstrated an understanding of the instructions.   The patient was advised to call back or seek an in-person evaluation if the symptoms worsen or if the condition fails to improve as anticipated.  I provided 13 minutes of non-face-to-face time during this encounter.  03/09/19. Janene Harvey, AGNP Avs printed  and mailed

## 2019-03-07 NOTE — Progress Notes (Signed)
This service is provided via telemedicine  No vital signs collected/recorded due to the encounter was a telemedicine visit.   Location of patient (ex: home, work):  Home  Patient consents to a telephone visit:  Yes  Location of the provider (ex: office, home):  [facility]  Name of any referring provider:  [name], NP  Names of all persons participating in the telemedicine service and their role in the encounter:  Bonney Leitz, [place]; [name], NP; patient.  Time spent on call:  5.43 minutes CMA time only

## 2019-03-07 NOTE — Patient Instructions (Addendum)
To use heating pad to back 3 times daily for ~ 30 mins Muscle rub after heat Can use additional 325 mg of tylenol with oxycodone-apap every 6 hours. Do not take more frequently and limit tylenol total to <3000 mg in 24 hours.  To get xray of low back at [facility]. We will call with results.

## 2019-03-08 ENCOUNTER — Ambulatory Visit
Admission: RE | Admit: 2019-03-08 | Discharge: 2019-03-08 | Disposition: A | Discharge status: Home / Self Care | Payer: Medicare Other | Source: Ambulatory Visit | Attending: Nurse Practitioner | Admitting: Nurse Practitioner

## 2019-03-08 DIAGNOSIS — G8929 Other chronic pain: Secondary | ICD-10-CM

## 2019-03-08 DIAGNOSIS — M5441 Lumbago with sciatica, right side: Secondary | ICD-10-CM

## 2019-03-08 IMAGING — CR DG LUMBAR SPINE COMPLETE 4+V
5 series · 5 of 5 positions shown · non-contrast
Comparison: None.

CLINICAL DATA: 58-year-old female with low back pain radiating down
the leg. No known injury.

EXAM:
LUMBAR SPINE - COMPLETE 4+ VIEW

[t l-spine a.p.]
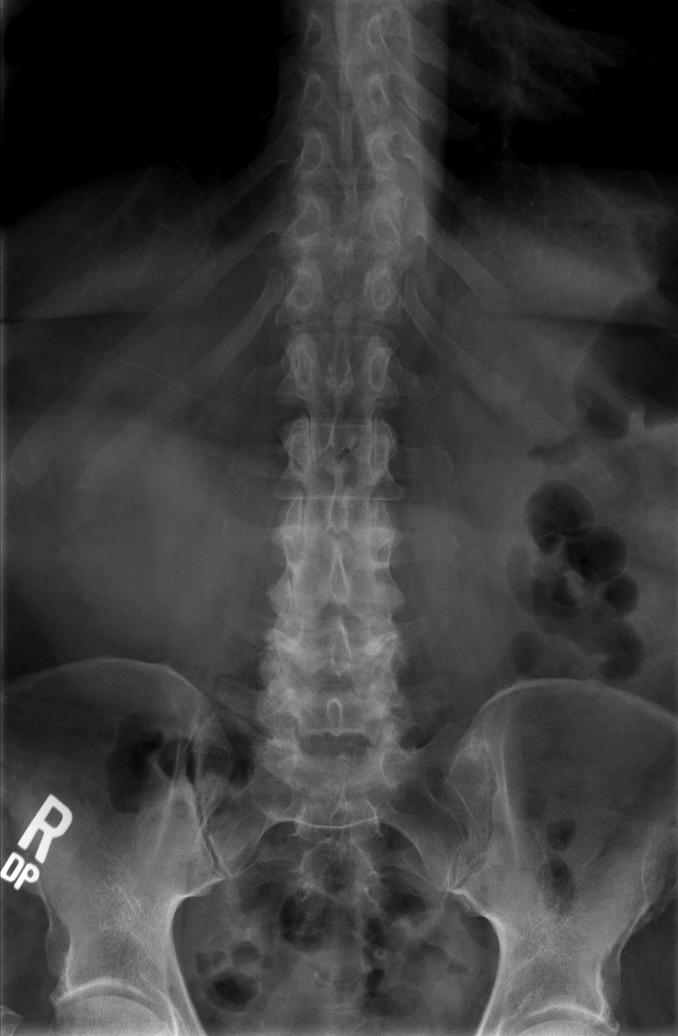

[t l-spine oblique exposure (1 of 2)]
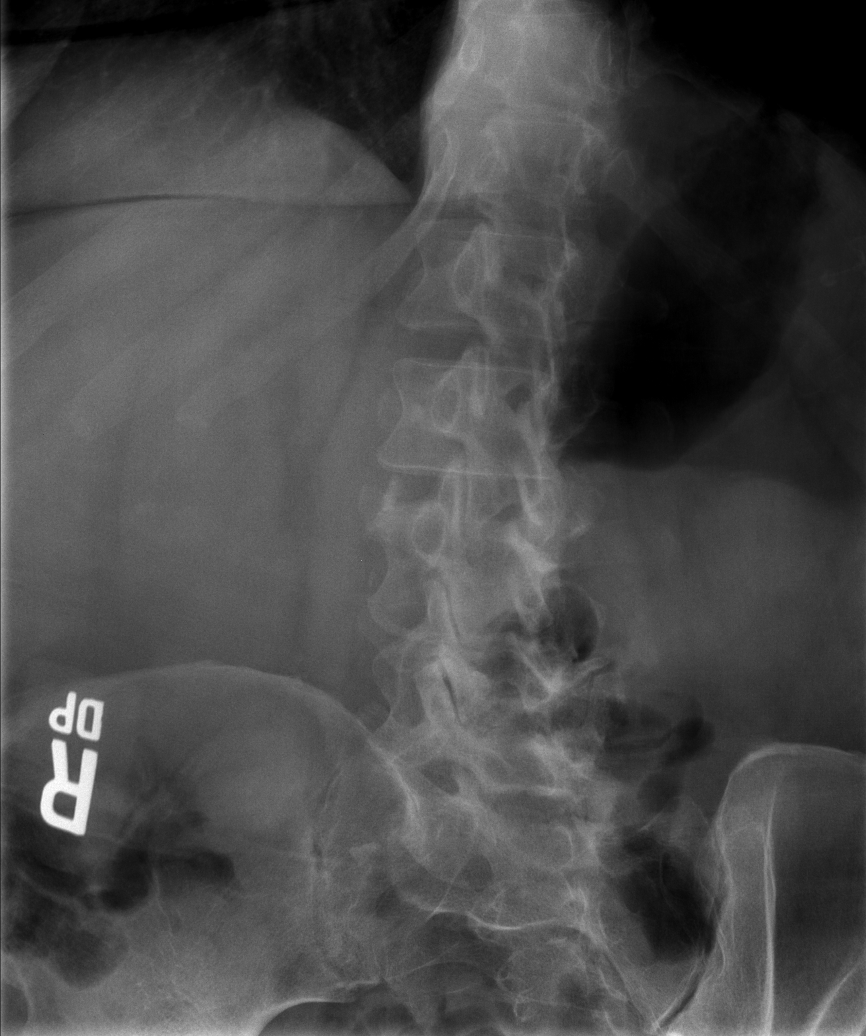

[t l-spine oblique exposure (2 of 2)]
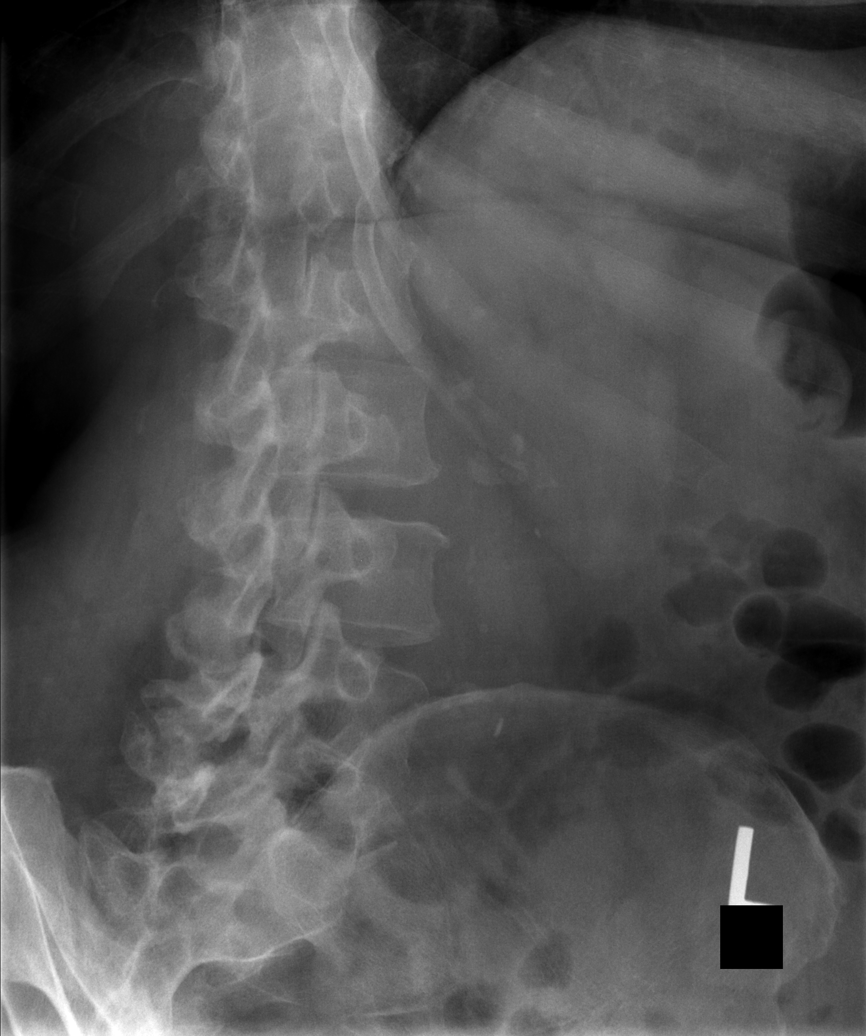

[t l-spine lat]
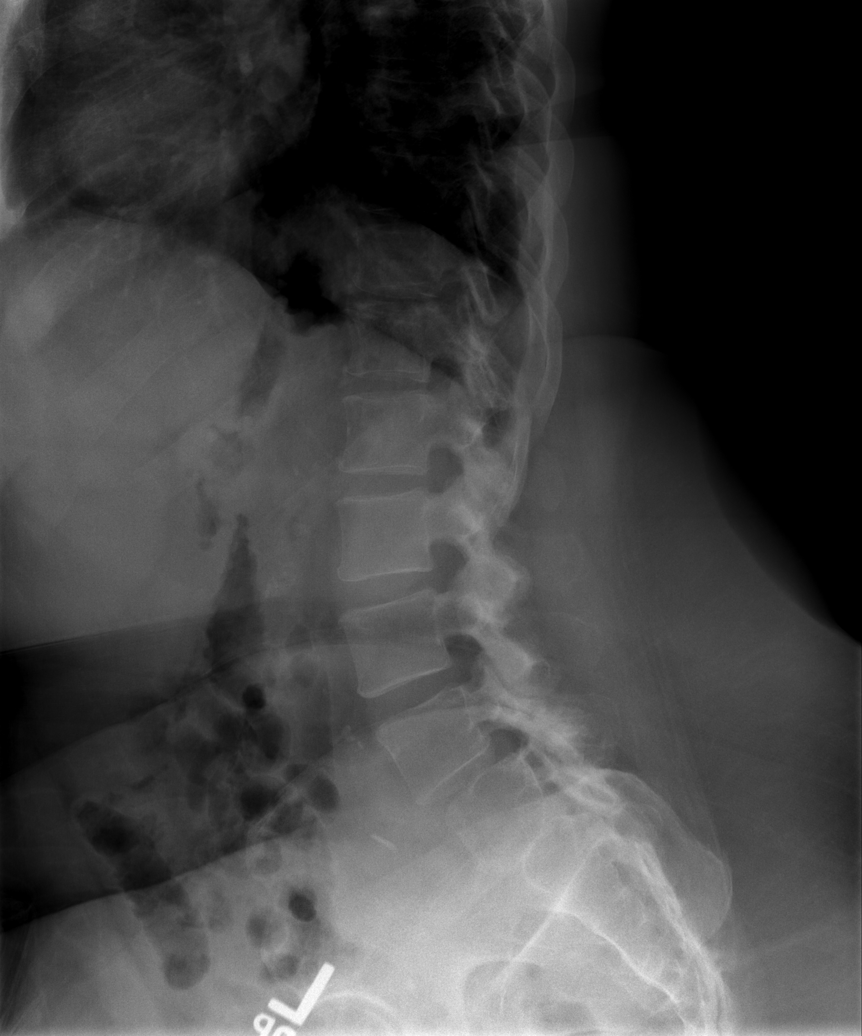

[t l-spine l5-s1 spot]
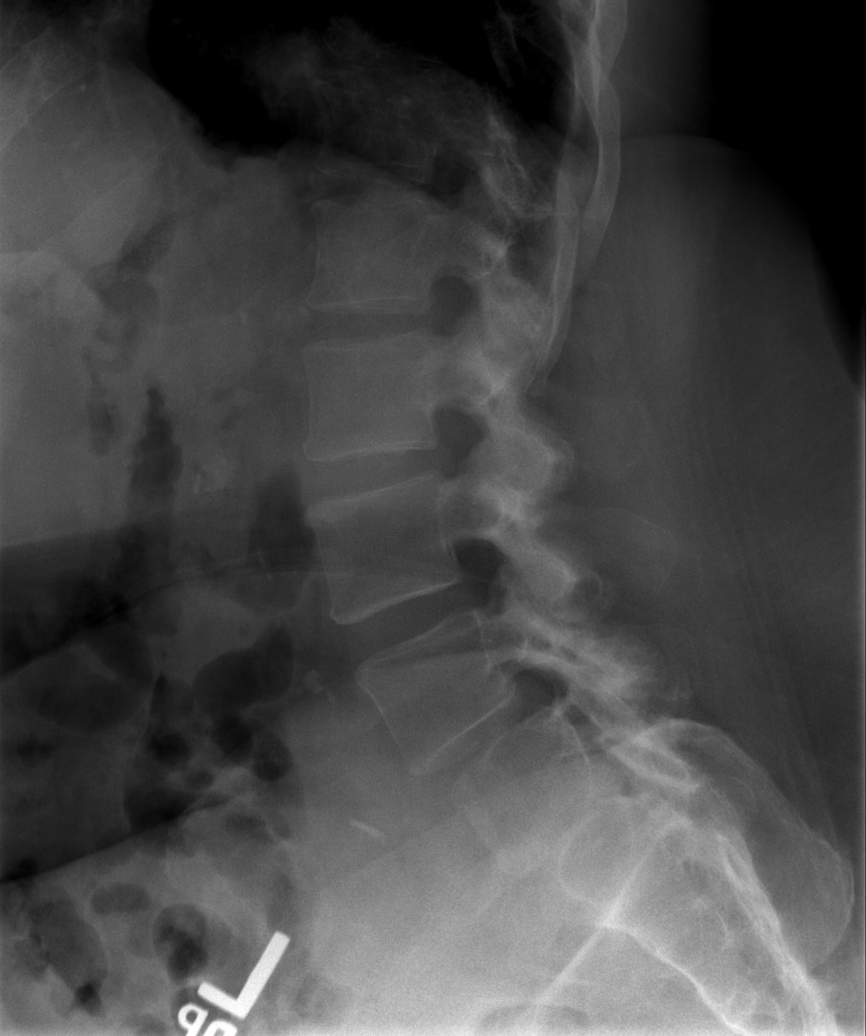

[5 of 5 positions shown; findings below may reference images not displayed]

FINDINGS: Five lumbar type vertebra. There is no acute fracture or subluxation
of the lumbar spine. There is grade 1 L4-L5 anterolisthesis. Lower
lumbar facet arthropathy. The neural foramina are patent as
visualized. The soft tissues are unremarkable.
IMPRESSION: 1. No acute findings.
2. Grade 1 L4-L5 anterolisthesis.

## 2019-03-09 ENCOUNTER — Other Ambulatory Visit: Payer: Self-pay | Admitting: Nurse Practitioner

## 2019-03-09 DIAGNOSIS — M4310 Spondylolisthesis, site unspecified: Secondary | ICD-10-CM

## 2019-03-20 ENCOUNTER — Encounter: Payer: Self-pay | Admitting: Nurse Practitioner

## 2019-03-20 ENCOUNTER — Ambulatory Visit (INDEPENDENT_AMBULATORY_CARE_PROVIDER_SITE_OTHER): Payer: Medicare Other | Admitting: Nurse Practitioner

## 2019-03-20 ENCOUNTER — Other Ambulatory Visit: Payer: Self-pay

## 2019-03-20 DIAGNOSIS — M5441 Lumbago with sciatica, right side: Secondary | ICD-10-CM | POA: Diagnosis not present

## 2019-03-20 DIAGNOSIS — G8929 Other chronic pain: Secondary | ICD-10-CM | POA: Diagnosis not present

## 2019-03-20 DIAGNOSIS — F419 Anxiety disorder, unspecified: Secondary | ICD-10-CM

## 2019-03-20 MED ORDER — OXYCODONE-ACETAMINOPHEN 10-325 MG PO TABS: 1.0000 | ORAL_TABLET | Freq: Three times a day (TID) | ORAL | 0 refills | Status: DC | PRN

## 2019-03-20 NOTE — Progress Notes (Signed)
This service is provided via telemedicine  No vital signs collected/recorded due to the encounter was a telemedicine visit.   Location of patient (ex: home, work): Home  Patient consents to a telephone visit:  Yes  Location of the provider (ex: office, home): [employer], Office   Name of any referring provider: n/a  Names of all persons participating in the telemedicine service and their role in the encounter: [name], [name], NP, and Patient   Time spent on call:  7 min with medical assistant      Careteam: Patient Care Team: Lauree Chandler, NP as PCP - General (Geriatric Medicine)  Advanced Directive information    Allergies  Allergen Reactions  . Aspirin Hives  . Penicillins Hives    Chief Complaint  Patient presents with  . Follow-up    1 month follow-up on mood. Telephone visit, refused virtual visit.      HPI: Patient is a 58 y.o. female via telephone visit for mood.   Started on Cymbalta 30 mg daily last month- tolerating medication. Reports sleeping some now. Anxiety is better.   Overall pain has been better. Increase in pain for 1 day then got better. Does not wish to go to neurosurgery because pt reports "injections hurt worse than pain"    Review of Systems:  Review of Systems  Musculoskeletal: Positive for back pain and myalgias.  Neurological: Positive for tingling.  Psychiatric/Behavioral: Negative for suicidal ideas. The patient is nervous/anxious and has insomnia.        Overall anxiety and insomnia improved    Past Medical History:  Diagnosis Date  . Acute bronchitis   . Cervical high risk HPV (human papillomavirus) test positive 12/2017   Negative subtype 16, 18/45  . Chronic back pain   . Complication of anesthesia    "didn't wake up"  . Cough   . Insomnia, unspecified   . Other malaise and fatigue   . Other specified diseases of blood and blood-forming organs(289.89)   . Tobacco use disorder     Past Surgical History:  Procedure Laterality Date  . ESOPHAGOGASTRODUODENOSCOPY N/A 03/17/2013   Procedure: ESOPHAGOGASTRODUODENOSCOPY (EGD);  Surgeon: Ladene Artist, MD;  Location: [facility] ENDOSCOPY;  Service: Endoscopy;  Laterality: N/A;  . FRACTURE SURGERY    . TONSILLECTOMY     Social History:   reports that she has been smoking cigarettes. She has a 6.00 pack-year smoking history. She has never used smokeless tobacco. She reports current alcohol use. She reports that she does not use drugs.  Family History  Problem Relation Age of Onset  . Arthritis Mother   . Hypertension Sister   . Hypertension Brother   . Hypertension Sister   . Hypertension Sister   . Hypertension Sister     Medications: Patient's Medications  New Prescriptions   No medications on file  Previous Medications   ACETAMINOPHEN (TYLENOL) 500 MG TABLET    Take 500 mg by mouth as needed.   ALBUTEROL (VENTOLIN HFA) 108 (90 BASE) MCG/ACT INHALER    INHALE 2 PUFFS INTO THE LUNGS EVERY 6 HOURS AS NEEDED FOR WHEEZING OR SHORTNESS OF BREATH   DULOXETINE (CYMBALTA) 30 MG CAPSULE    Take 1 capsule (30 mg total) by mouth daily.   FOLIC ACID (FOLVITE) 1 MG TABLET    Take 1 mg by mouth daily.   LOSARTAN (COZAAR) 25 MG TABLET    Take 1 tablet (25 mg total) by mouth daily.   OXYCODONE-ACETAMINOPHEN (  PERCOCET) 10-325 MG TABLET    Take 1 tablet by mouth every 6 (six) hours as needed for pain.  Modified Medications   No medications on file  Discontinued Medications   No medications on file    Physical Exam:  There were no vitals filed for this visit. There is no height or weight on file to calculate BMI. Wt Readings from Last 3 Encounters:  03/07/19 223 lb 3.2 oz (101.2 kg)  02/08/19 223 lb 3.2 oz (101.2 kg)  01/18/19 225 lb 3.2 oz (102.2 kg)      Labs reviewed: Basic Metabolic Panel: Recent Labs    04/11/18 1351 02/17/19 1102  NA 138 137  K 4.1 3.7  CL 97* 103  CO2 29 25  GLUCOSE 108 95  BUN 11 8   CREATININE 0.47* 0.59  CALCIUM 9.9 9.4   Liver Function Tests: Recent Labs    04/11/18 1351 02/17/19 1102  AST 32 22  ALT 14 14  BILITOT 1.4* 0.8  PROT 7.5 6.8   No results for input(s): LIPASE, AMYLASE in the last 8760 hours. No results for input(s): AMMONIA in the last 8760 hours. CBC: Recent Labs    04/11/18 1351 02/17/19 1102  WBC 4.4 4.5  NEUTROABS 3,111 2,691  HGB 14.2 13.1  HCT 40.8 36.8  MCV 107.7* 112.2*  PLT 216 154   Lipid Panel: Recent Labs    04/11/18 1351 02/17/19 1102  CHOL 237* 184  HDL 128 93  LDLCALC 92 76  TRIG 76 71  CHOLHDL 1.9 2.0   TSH: No results for input(s): TSH in the last 8760 hours. A1C: Lab Results  Component Value Date   HGBA1C 4.8 07/01/2015     Assessment/Plan 1. Anxiety -better on cymbalta 30 mg by mouth daily- tolerating well. No adverse reactions noted. Will continue on current regimen.  2. Chronic right-sided low back pain with right-sided sciatica -discussed chronic pain medication. Pt has been referred to neurosurgery for possible injection. Based on the bedside opioid-overdose calculator pt is at 55% risk of Overdose or serious opioid -induced respiratory depression in the next 6 month due to alcohol use and being on antidepressant. Pt previously on benzodiazepine but this has been stopped. Recommended to cut back on percocet to 10-325 mg every 8 hours and to go for injection to minimize use of medication. Can use tylenol 500 mg tablet 1-2 tablet every 8 hours as needed for pain- MAX of 3000 mg in 24 hours from all sources (can take 4 tylenol with 3 percocet)  Pt is agreeable to plan.  - oxyCODONE-acetaminophen (PERCOCET) 10-325 MG tablet; Take 1 tablet by mouth every 8 (eight) hours as needed for pain.  Dispense: 90 tablet; Refill: 0  Next appt: 04/17/2019 as scheduled.  Janene Harvey. Biagio Borg  [facility] [phone]    Virtual Visit via Telephone Note  I connected with pt on  03/20/19 at 10:30 AM EST by telephone and verified that I am speaking with the correct person using two identifiers.  Location: Patient: home Provider: office   I discussed the limitations, risks, security and privacy concerns of performing an evaluation and management service by telephone and the availability of in person appointments. I also discussed with the patient that there may be a patient responsible charge related to this service. The patient expressed understanding and agreed to proceed.   I discussed the assessment and treatment plan with the patient. The patient was provided an opportunity to ask questions  and all were answered. The patient agreed with the plan and demonstrated an understanding of the instructions.   The patient was advised to call back or seek an in-person evaluation if the symptoms worsen or if the condition fails to improve as anticipated.  I provided 15 minutes of non-face-to-face time during this encounter.  Carlos American. Harle Battiest Avs printed and mailed

## 2019-03-20 NOTE — Patient Instructions (Addendum)
To attempt to minimize oxycodone-apap use.  Rx changed to every 8 hours as needed severe pain. To use tylenol as able.   Can use tylenol 500 mg tablet 1-2 tablet every 8 hours as needed for pain- MAX of 3000 mg of tylenol in 24 hours from all sources (can take 4 tylenol with 3 percocet- but ideally try to use LESS percocet)  To make visit with neurosurgery as well.   You are at a 55% risk for overdose or serious opioid induced respiratory depression based on alcohol use and medication.

## 2019-03-23 ENCOUNTER — Other Ambulatory Visit: Payer: Self-pay | Admitting: *Deleted

## 2019-03-23 DIAGNOSIS — G8929 Other chronic pain: Secondary | ICD-10-CM

## 2019-03-23 MED ORDER — OXYCODONE-ACETAMINOPHEN 10-325 MG PO TABS: 1.0000 | ORAL_TABLET | Freq: Three times a day (TID) | ORAL | 0 refills | Status: DC | PRN

## 2019-03-23 NOTE — Telephone Encounter (Signed)
Patient called requesting Rx to be sent to pharmacy.

## 2019-03-23 NOTE — Telephone Encounter (Signed)
Patient requested refill.  Patient has an appointment scheduled for 12/28 to update narcotic contract  [place] Verified LR: 02/21/2019 Pended Rx and sent to [facility] for approval.

## 2019-04-17 ENCOUNTER — Other Ambulatory Visit: Payer: Self-pay

## 2019-04-17 ENCOUNTER — Telehealth: Payer: Self-pay

## 2019-04-17 ENCOUNTER — Encounter: Payer: Self-pay | Admitting: Nurse Practitioner

## 2019-04-17 ENCOUNTER — Ambulatory Visit (INDEPENDENT_AMBULATORY_CARE_PROVIDER_SITE_OTHER): Payer: Medicare Other | Admitting: Nurse Practitioner

## 2019-04-17 VITALS — BP 144/82 | HR 103 | Temp 97.7°F | Ht 65.0 in | Wt 217.0 lb

## 2019-04-17 DIAGNOSIS — F1010 Alcohol abuse, uncomplicated: Secondary | ICD-10-CM

## 2019-04-17 DIAGNOSIS — Z720 Tobacco use: Secondary | ICD-10-CM | POA: Diagnosis not present

## 2019-04-17 DIAGNOSIS — F411 Generalized anxiety disorder: Secondary | ICD-10-CM

## 2019-04-17 DIAGNOSIS — M5441 Lumbago with sciatica, right side: Secondary | ICD-10-CM

## 2019-04-17 DIAGNOSIS — G8929 Other chronic pain: Secondary | ICD-10-CM

## 2019-04-17 MED ORDER — OXYCODONE-ACETAMINOPHEN 5-325 MG PO TABS: 1.0000 | ORAL_TABLET | Freq: Three times a day (TID) | ORAL | 0 refills | Status: DC | PRN

## 2019-04-17 NOTE — Telephone Encounter (Signed)
Patient was in office for medication review and to update opioid treatment agreement and left prior to collecting urine sample for urine drug screen.  I called patient and requested that she return for lab sample collection per Lauree Chandler, NP.  Per Lauree Chandler, NP I am to make one more attempt to reach patient and advise that if urine drug screen is not completed within the next 24 hours patient is in violation of treatment agreement and Oxycodone will no longer be prescribed.

## 2019-04-17 NOTE — Progress Notes (Signed)
Careteam: Patient Care Team: Sharon Seller, NP as PCP - General (Geriatric Medicine)  Advanced Directive information    Allergies  Allergen Reactions  . Aspirin Hives  . Penicillins Hives    Chief Complaint  Patient presents with  . Medication Management    Review medication and updated opioid treatment agreement      HPI: Patient is a 58 y.o. female seen in the office today for pain management.  Has not gone to neurosurgery for evaluation and possible intervention due to pain. Been doing well with the oxycodone 10/325 every 8 hours as needed for pain. Does not routinely use every 8 hours. Will try to go without if needed.   Started cymbalta 30 mg by mouth daily with good effects.   Has continue to cut back on drinking alcohol.   Folic acid def- currently on folate 1 mg daily.   Following with GYN for abnormal PAP, needs to get mamogram, plans to schedule.   cologuard up to date- negative result.       Review of Systems:  Review of Systems  Respiratory: Negative for cough and shortness of breath.   Cardiovascular: Negative for chest pain.  Musculoskeletal: Positive for back pain and myalgias.  Psychiatric/Behavioral: Negative for depression. The patient has insomnia. The patient is not nervous/anxious.     Past Medical History:  Diagnosis Date  . Acute bronchitis   . Cervical high risk HPV (human papillomavirus) test positive 12/2017   Negative subtype 16, 18/45  . Chronic back pain   . Complication of anesthesia    "didn't wake up"  . Cough   . Insomnia, unspecified   . Other malaise and fatigue   . Other specified diseases of blood and blood-forming organs(289.89)   . Tobacco use disorder    Past Surgical History:  Procedure Laterality Date  . ESOPHAGOGASTRODUODENOSCOPY N/A 03/17/2013   Procedure: ESOPHAGOGASTRODUODENOSCOPY (EGD);  Surgeon: Meryl Dare, MD;  Location: [facility] ENDOSCOPY;  Service: Endoscopy;  Laterality: N/A;  . FRACTURE  SURGERY    . TONSILLECTOMY     Social History:   reports that she has been smoking cigarettes. She has a 6.00 pack-year smoking history. She has never used smokeless tobacco. She reports current alcohol use. She reports that she does not use drugs.  Family History  Problem Relation Age of Onset  . Arthritis Mother   . Hypertension Sister   . Hypertension Brother   . Hypertension Sister   . Hypertension Sister   . Hypertension Sister     Medications: Patient's Medications  New Prescriptions   No medications on file  Previous Medications   ACETAMINOPHEN (TYLENOL) 500 MG TABLET    Take 500 mg by mouth as needed.   ALBUTEROL (VENTOLIN HFA) 108 (90 BASE) MCG/ACT INHALER    INHALE 2 PUFFS INTO THE LUNGS EVERY 6 HOURS AS NEEDED FOR WHEEZING OR SHORTNESS OF BREATH   DULOXETINE (CYMBALTA) 30 MG CAPSULE    Take 1 capsule (30 mg total) by mouth daily.   FOLIC ACID (FOLVITE) 1 MG TABLET    Take 1 mg by mouth daily.   LOSARTAN (COZAAR) 25 MG TABLET    Take 1 tablet (25 mg total) by mouth daily.   OXYCODONE-ACETAMINOPHEN (PERCOCET) 10-325 MG TABLET    Take 1 tablet by mouth every 8 (eight) hours as needed for pain.  Modified Medications   No medications on file  Discontinued Medications   No medications on file    Physical Exam:  Vitals:   04/17/19 1311  BP: (!) 144/82  Pulse: (!) 103  Temp: 97.7 F (36.5 C)  TempSrc: Temporal  SpO2: 97%  Weight: 217 lb (98.4 kg)  Height: 5\' 5"  (1.651 m)   Body mass index is 36.11 kg/m. Wt Readings from Last 3 Encounters:  04/17/19 217 lb (98.4 kg)  03/07/19 223 lb 3.2 oz (101.2 kg)  02/08/19 223 lb 3.2 oz (101.2 kg)    Physical Exam Constitutional:      Appearance: Normal appearance.  Musculoskeletal:        General: Normal range of motion.     Right lower leg: No edema.     Left lower leg: No edema.  Skin:    General: Skin is warm and dry.  Neurological:     Mental Status: She is alert and oriented to person, place, and time.    Psychiatric:        Mood and Affect: Mood normal.        Behavior: Behavior normal.     Labs reviewed: Basic Metabolic Panel: Recent Labs    02/17/19 1102  NA 137  K 3.7  CL 103  CO2 25  GLUCOSE 95  BUN 8  CREATININE 0.59  CALCIUM 9.4   Liver Function Tests: Recent Labs    02/17/19 1102  AST 22  ALT 14  BILITOT 0.8  PROT 6.8   No results for input(s): LIPASE, AMYLASE in the last 8760 hours. No results for input(s): AMMONIA in the last 8760 hours. CBC: Recent Labs    02/17/19 1102  WBC 4.5  NEUTROABS 2,691  HGB 13.1  HCT 36.8  MCV 112.2*  PLT 154   Lipid Panel: Recent Labs    02/17/19 1102  CHOL 184  HDL 93  LDLCALC 76  TRIG 71  CHOLHDL 2.0   TSH: No results for input(s): TSH in the last 8760 hours. A1C: Lab Results  Component Value Date   HGBA1C 4.8 07/01/2015     Assessment/Plan 1. Chronic right-sided low back pain with right-sided sciatica -doing well with dose reduction, states able to longer than 8 hours at time now that using cymbalta.  -will continue reduce oxycodone as she is tolerating the decrease. -discussed using tylenol first for mild to moderate pain and to use oxycodone-apap for severe pain only -if needed to follow up with neuro-specialist.  -opioid contract updated today - oxyCODONE-acetaminophen (PERCOCET/ROXICET) 5-325 MG tablet; Take 1 tablet by mouth every 8 (eight) hours as needed for severe pain.  Dispense: 90 tablet; Refill: 0 - UDS (urine drug screen)  2. Tobacco abuse -encourage smoking cessation.  3. Generalized anxiety disorder -improved on cymbalta, will continue at this time as also helping with pain reduction.  4. ETOH abuse -reports has cut back on ETOH use, has not had a drink in several weeks.  Discuss at risk for adverse event with consuming ETOH and on opioid pain medication. Pt in agreement and understands.   Next appt: 08/11/2019 Carlos American. [place] [phone]

## 2019-04-17 NOTE — Patient Instructions (Addendum)
At next refill to get oxycodone 5-325 mg every 8 hours as needed for SEVERE pain.   To use tylenol 325 mg 1-2 tablets every 6 hours as needed for mild to moderate pain- to use this first To not exceed 3000 mg of tylenol in 24 hours  If pain is controlled on tylenol and Cymbalta with reduction of oxycodone can hold off neurology consult Could also consider physical therapy referral.

## 2019-04-17 NOTE — Telephone Encounter (Signed)
Patient returned to the office to collect urine specimen

## 2019-04-18 LAB — PAIN MGMT, PROFILE 1 W/O CONF, U
Amphetamines: NEGATIVE ng/mL
Barbiturates: NEGATIVE ng/mL
Benzodiazepines: NEGATIVE ng/mL
Cocaine Metabolite: NEGATIVE ng/mL
Creatinine: 159.8 mg/dL
Marijuana Metabolite: NEGATIVE ng/mL
Methadone Metabolite: NEGATIVE ng/mL
Opiates: POSITIVE ng/mL
Oxidant: NEGATIVE ug/mL
Oxycodone: POSITIVE ng/mL
Phencyclidine: NEGATIVE ng/mL
pH: 5.8 (ref 4.5–9.0)

## 2019-04-20 ENCOUNTER — Other Ambulatory Visit: Payer: Self-pay | Admitting: *Deleted

## 2019-04-20 DIAGNOSIS — G8929 Other chronic pain: Secondary | ICD-10-CM

## 2019-04-20 DIAGNOSIS — M5441 Lumbago with sciatica, right side: Secondary | ICD-10-CM

## 2019-04-20 MED ORDER — OXYCODONE-ACETAMINOPHEN 5-325 MG PO TABS: 1.0000 | ORAL_TABLET | Freq: Three times a day (TID) | ORAL | 0 refills | Status: DC | PRN

## 2019-04-20 NOTE — Telephone Encounter (Signed)
Patient requested refill Narcotic Contract updated [place] Verified LR: 03/23/2019 Pended Rx and sent to [facility] for approval.

## 2019-04-26 ENCOUNTER — Other Ambulatory Visit: Payer: Self-pay

## 2019-04-26 ENCOUNTER — Encounter: Payer: Self-pay | Admitting: Family

## 2019-04-26 ENCOUNTER — Ambulatory Visit (INDEPENDENT_AMBULATORY_CARE_PROVIDER_SITE_OTHER): Payer: Medicare Other | Admitting: Family

## 2019-04-26 DIAGNOSIS — R059 Cough, unspecified: Secondary | ICD-10-CM

## 2019-04-26 DIAGNOSIS — R630 Anorexia: Secondary | ICD-10-CM

## 2019-04-26 DIAGNOSIS — R531 Weakness: Secondary | ICD-10-CM | POA: Diagnosis not present

## 2019-04-26 DIAGNOSIS — R1111 Vomiting without nausea: Secondary | ICD-10-CM | POA: Diagnosis not present

## 2019-04-26 DIAGNOSIS — R05 Cough: Secondary | ICD-10-CM | POA: Diagnosis not present

## 2019-04-26 NOTE — Patient Instructions (Signed)
Please go to the Emergency room as soon as possible for evaluation of your worsening cough,chills,shortness of breath and loss of appetite.

## 2019-04-26 NOTE — Progress Notes (Signed)
This service is provided via telemedicine  No vital signs collected/recorded due to the encounter was a telemedicine visit.   Location of patient (ex: home, work): Home   Patient consents to a telephone visit:  Yes  Location of the provider (ex: office, home):  Office   Name of any referring provider:  [name], NP   Names of all persons participating in the telemedicine service and their role in the encounter:  Sarahmarie Leavey NP, Ruthell Rummage CMA, and patient   Time spent on call: Ruthell Rummage CMA spent 5 minutes on phone with patient    Provider: Cristle Jared FNP-C  Lauree Chandler, NP  Patient Care Team: Lauree Chandler, NP as PCP - General (Geriatric Medicine)  Extended Emergency Contact Information Primary Emergency Contact: Barb Merino of [place] Phone: [phone] Mobile Phone: [phone] Relation: Friend Secondary Emergency Contact: [place] Phone: [phone] Relation: Relative  Code Status: Full Code  Goals of care: Advanced Directive information Advanced Directives 02/15/2019  Does Patient Have a Medical Advance Directive? No  Would patient like information on creating a medical advance directive? Yes (MAU/Ambulatory/Procedural Areas - Information given)  Pre-existing out of facility DNR order (yellow form or pink MOST form) -     Chief Complaint  Patient presents with  . Acute Visit    Complains of Cough,chills,vomitting, and no appetitie. Started on Saturday.   . Medication Management    mucinex dm, tyenol, and nyquil and nothing is helping     HPI:  Pt is a 59 y.o. female seen today for an acute visit for evaluation of Cough,chills,vomitting, and loss of appetite X 04/22/2019.He has taken  mucinex dm, tyenol, and nyquil without any relief.He states having chest tightness and shortness of breath unable to walk from his car to the house.He states currently in the car warming up because he has no heat in  the house. Has used albuterol inhaler without any relief.He does not recall being in contact with person with COVID-19 infections.He states has a Kit that was send from his insurance to test for COVID-19 but was told not to open unless in contact with the doctor.     Past Medical History:  Diagnosis Date  . Acute bronchitis   . Cervical high risk HPV (human papillomavirus) test positive 12/2017   Negative subtype 16, 18/45  . Chronic back pain   . Complication of anesthesia    "didn't wake up"  . Cough   . Insomnia, unspecified   . Other malaise and fatigue   . Other specified diseases of blood and blood-forming organs(289.89)   . Tobacco use disorder    Past Surgical History:  Procedure Laterality Date  . ESOPHAGOGASTRODUODENOSCOPY N/A 03/17/2013   Procedure: ESOPHAGOGASTRODUODENOSCOPY (EGD);  Surgeon: Ladene Artist, MD;  Location: [facility] ENDOSCOPY;  Service: Endoscopy;  Laterality: N/A;  . FRACTURE SURGERY    . TONSILLECTOMY      Allergies  Allergen Reactions  . Aspirin Hives  . Penicillins Hives    Outpatient Encounter Medications as of 04/26/2019  Medication Sig  . acetaminophen (TYLENOL) 500 MG tablet Take 500 mg by mouth as needed.  Marland Kitchen albuterol (VENTOLIN HFA) 108 (90 Base) MCG/ACT inhaler INHALE 2 PUFFS INTO THE LUNGS EVERY 6 HOURS AS NEEDED FOR WHEEZING OR SHORTNESS OF BREATH  . DULoxetine (CYMBALTA) 30 MG capsule Take 1 capsule (30 mg total) by mouth daily.  . folic acid (FOLVITE) 1 MG tablet Take 1 mg by mouth daily.  Marland Kitchen  losartan (COZAAR) 25 MG tablet Take 1 tablet (25 mg total) by mouth daily.  Marland Kitchen oxyCODONE-acetaminophen (PERCOCET/ROXICET) 5-325 MG tablet Take 1 tablet by mouth every 8 (eight) hours as needed for severe pain.   No facility-administered encounter medications on file as of 04/26/2019.    Review of Systems  Constitutional: Positive for chills and fatigue. Negative for appetite change and fever.  HENT: Positive for rhinorrhea. Negative for congestion, sinus  pressure, sinus pain, sneezing and sore throat.   Eyes: Negative for discharge, redness and itching.  Respiratory: Positive for cough, chest tightness and shortness of breath. Negative for wheezing.   Cardiovascular: Negative for chest pain, palpitations and leg swelling.  Gastrointestinal: Negative for abdominal distention, abdominal pain and constipation.       Had diarrhea,vomiting 04/24/2019   Musculoskeletal: Negative for myalgias.  Skin: Negative for color change, pallor and rash.  Neurological: Negative for dizziness, speech difficulty, light-headedness and headaches.  Psychiatric/Behavioral: Negative for agitation, confusion and sleep disturbance. The patient is not nervous/anxious.     Immunization History  Administered Date(s) Administered  . Influenza Whole 05/16/2012  . Influenza,inj,Quad PF,6+ Mos 03/02/2013, 04/19/2014, 02/22/2015, 03/23/2016, 03/24/2018, 01/18/2019  . Pneumococcal Conjugate-13 02/22/2015  . Pneumococcal Polysaccharide-23 03/02/2013  . Tdap 10/07/2015   Pertinent  Health Maintenance Due  Topic Date Due  . MAMMOGRAM  09/08/2012  . PAP SMEAR-Modifier  01/06/2021  . INFLUENZA VACCINE  Completed   Fall Risk  04/17/2019 02/15/2019 01/25/2019 01/18/2019 09/30/2018  Falls in the past year? 0 1 0 0 1  Number falls in past yr: 0 0 0 0 0  Injury with Fall? 0 0 0 0 0  Follow up - - - - Falls prevention discussed   There were no vitals filed for this visit. There is no height or weight on file to calculate BMI. Physical Exam Unable to complete on telephone visit.  Labs reviewed: Recent Labs    02/17/19 1102  NA 137  K 3.7  CL 103  CO2 25  GLUCOSE 95  BUN 8  CREATININE 0.59  CALCIUM 9.4   Recent Labs    02/17/19 1102  AST 22  ALT 14  BILITOT 0.8  PROT 6.8   Recent Labs    02/17/19 1102  WBC 4.5  NEUTROABS 2,691  HGB 13.1  HCT 36.8  MCV 112.2*  PLT 154   Lab Results  Component Value Date   TSH 3.93 08/10/2017   Lab Results   Component Value Date   HGBA1C 4.8 07/01/2015   Lab Results  Component Value Date   CHOL 184 02/17/2019   HDL 93 02/17/2019   LDLCALC 76 02/17/2019   TRIG 71 02/17/2019   CHOLHDL 2.0 02/17/2019    Significant Diagnostic Results in last 30 days:  No results found.  Assessment/Plan  1. Generalized weakness Worsening weakness suspect possible due to not eating.Recommended to follow up in the ED to rule out COVID-19 given his worsening cough,loss of appetite,diarrhea and vomiting.He states will get someone to drive him to the ED.   2. Loss of appetite No loss of taste or smell.Recommended evaluation in the ED.  3. Cough Has had chills but no fever.worsening shortness of breath mucinex and albuterol inhaler ineffective.Recommended to go to the ED for further evaluation.   4. Vomiting without nausea, intractability of vomiting not specified, unspecified vomiting type Had vomiting on 04/23/2018 but none today.Encouraged to increase fluid intake.   Family/ staff Communication: Reviewed plan of care with patient recommended to go  to the ED for further evaluation of worsening cough,shortness of breath,generalized weakness and chills to rule out possible COVID-19 verse Pneumonia.Patient verbalized understanding will call someone to drop him to the ED.   Labs/tests ordered: None   Next appt: as needed   Spent 11 minutes of non-face to face with patient    Sandrea Hughs, NP

## 2019-05-01 ENCOUNTER — Telehealth (INDEPENDENT_AMBULATORY_CARE_PROVIDER_SITE_OTHER): Payer: Medicare Other | Admitting: Nurse Practitioner

## 2019-05-01 ENCOUNTER — Encounter: Payer: Self-pay | Admitting: Nurse Practitioner

## 2019-05-01 ENCOUNTER — Other Ambulatory Visit: Payer: Self-pay

## 2019-05-01 DIAGNOSIS — J069 Acute upper respiratory infection, unspecified: Secondary | ICD-10-CM | POA: Diagnosis not present

## 2019-05-01 MED ORDER — LEVOFLOXACIN 500 MG PO TABS: 500.0000 mg | ORAL_TABLET | Freq: Every day | ORAL | 0 refills | Status: DC

## 2019-05-01 NOTE — Progress Notes (Signed)
This service is provided via telemedicine  No vital signs collected/recorded due to the encounter was a telemedicine visit.   Location of patient (ex: home, work):  Home   Patient consents to a telephone visit:  Yes  Location of the provider (ex: office, home):  [employer]   Name of any referring provider:  N/A  Names of all persons participating in the telemedicine service and their role in the encounter:  Patient, Laura [name], RMA, [name], NP.    Time spent on call:  8 minutes spent with Medical Assistant.     Careteam: Patient Care Team: Lauree Chandler, NP as PCP - General (Geriatric Medicine)  Advanced Directive information Does Patient Have a Medical Advance Directive?: Yes(Patient has paper that are not scanned in and doesn't know which paper they have.), Does patient want to make changes to medical advance directive?: No - Patient declined  Allergies  Allergen Reactions  . Aspirin Hives  . Penicillins Hives    Chief Complaint  Patient presents with  . Acute Visit    Cough x 2 weeks.      HPI: Patient is a 59 y.o. female video visit due to cough for 2 weeks.  Reports her insurance has a home testing kit to test for COVID -19 and winter illness care package.  Does not know how to use the test kit Had cough for 2 weeks, now short of breath with minimal activity. Some wheezing.   Body aches and chills.  No fever. Able to eat and drink a lot of orange juice and water.  Eating chicken noodle soup.  No changes in urination.  No loss taste or smell.  A little nausea, vomited 2 weeks ago but now keeping stuff down. Taking mucinex max. Has not been smoking.    Review of Systems:  Review of Systems  Constitutional: Positive for chills and malaise/fatigue. Negative for fever and weight loss.  HENT: Negative for congestion and sore throat.        Runny nose  Respiratory: Positive for cough, shortness of breath and  wheezing.   Cardiovascular: Negative for chest pain.  Musculoskeletal: Positive for myalgias.   Past Medical History:  Diagnosis Date  . Acute bronchitis   . Cervical high risk HPV (human papillomavirus) test positive 12/2017   Negative subtype 16, 18/45  . Chronic back pain   . Complication of anesthesia    "didn't wake up"  . Cough   . Insomnia, unspecified   . Other malaise and fatigue   . Other specified diseases of blood and blood-forming organs(289.89)   . Tobacco use disorder    Past Surgical History:  Procedure Laterality Date  . ESOPHAGOGASTRODUODENOSCOPY N/A 03/17/2013   Procedure: ESOPHAGOGASTRODUODENOSCOPY (EGD);  Surgeon: Ladene Artist, MD;  Location: [facility] ENDOSCOPY;  Service: Endoscopy;  Laterality: N/A;  . FRACTURE SURGERY    . TONSILLECTOMY     Social History:   reports that she has been smoking cigarettes. She has a 6.00 pack-year smoking history. She has never used smokeless tobacco. She reports previous alcohol use. She reports that she does not use drugs.  Family History  Problem Relation Age of Onset  . Arthritis Mother   . Hypertension Sister   . Hypertension Brother   . Hypertension Sister   . Hypertension Sister   . Hypertension Sister     Medications: Patient's Medications  New Prescriptions   No medications on file  Previous Medications   ACETAMINOPHEN (  TYLENOL) 500 MG TABLET    Take 500 mg by mouth as needed.   ALBUTEROL (VENTOLIN HFA) 108 (90 BASE) MCG/ACT INHALER    INHALE 2 PUFFS INTO THE LUNGS EVERY 6 HOURS AS NEEDED FOR WHEEZING OR SHORTNESS OF BREATH   DULOXETINE (CYMBALTA) 30 MG CAPSULE    Take 1 capsule (30 mg total) by mouth daily.   FOLIC ACID (FOLVITE) 1 MG TABLET    Take 1 mg by mouth daily.   LOSARTAN (COZAAR) 25 MG TABLET    Take 1 tablet (25 mg total) by mouth daily.   OXYCODONE-ACETAMINOPHEN (PERCOCET/ROXICET) 5-325 MG TABLET    Take 1 tablet by mouth every 8 (eight) hours as needed for severe pain.  Modified Medications    No medications on file  Discontinued Medications   No medications on file    Physical Exam:  There were no vitals filed for this visit. There is no height or weight on file to calculate BMI. Wt Readings from Last 3 Encounters:  04/17/19 217 lb (98.4 kg)  03/07/19 223 lb 3.2 oz (101.2 kg)  02/08/19 223 lb 3.2 oz (101.2 kg)     Labs reviewed: Basic Metabolic Panel: Recent Labs    02/17/19 1102  NA 137  K 3.7  CL 103  CO2 25  GLUCOSE 95  BUN 8  CREATININE 0.59  CALCIUM 9.4   Liver Function Tests: Recent Labs    02/17/19 1102  AST 22  ALT 14  BILITOT 0.8  PROT 6.8   No results for input(s): LIPASE, AMYLASE in the last 8760 hours. No results for input(s): AMMONIA in the last 8760 hours. CBC: Recent Labs    02/17/19 1102  WBC 4.5  NEUTROABS 2,691  HGB 13.1  HCT 36.8  MCV 112.2*  PLT 154   Lipid Panel: Recent Labs    02/17/19 1102  CHOL 184  HDL 93  LDLCALC 76  TRIG 71  CHOLHDL 2.0   TSH: No results for input(s): TSH in the last 8760 hours. A1C: Lab Results  Component Value Date   HGBA1C 4.8 07/01/2015     Assessment/Plan 1. Upper respiratory tract infection, unspecified type -COVID like symptoms for 2 weeks. Advised to get COVID tested however at this point may test negative.  -without shortness of breath during visit. Recommended to use albuterol routinely 2-3 times daily -mucinex DM twice daily with full glass of water for 7 days - levofloxacin (LEVAQUIN) 500 MG tablet; Take 1 tablet (500 mg total) by mouth daily.  Dispense: 7 tablet; Refill: 0 -continue to stay hydrated  -encouraged to continue Vit C -strict follow up precautions given, Laura [name] expressed understanding  Laura [name]  [facility] [phone]  Virtual Visit via Video Note  I connected with Laura [name] on 05/01/19 at  3:15 PM EST by a video enabled telemedicine application and verified that I am speaking with the correct person using  two identifiers.  Location: Patient: home Provider: office   I discussed the limitations of evaluation and management by telemedicine and the availability of in person appointments. The patient expressed understanding and agreed to proceed.    I discussed the assessment and treatment plan with the patient. The patient was provided an opportunity to ask questions and all were answered. The patient agreed with the plan and demonstrated an understanding of the instructions.   The patient was advised to call back or seek an in-person evaluation if the symptoms worsen or if the  condition fails to improve as anticipated.  I provided 25  minutes of non-face-to-face time during this encounter.  Carlos American. Dewaine Oats, AGNP Avs printed and mailed.

## 2019-05-04 ENCOUNTER — Other Ambulatory Visit: Payer: Self-pay | Admitting: Nurse Practitioner

## 2019-05-04 DIAGNOSIS — G8929 Other chronic pain: Secondary | ICD-10-CM

## 2019-05-04 DIAGNOSIS — I10 Essential (primary) hypertension: Secondary | ICD-10-CM

## 2019-05-04 DIAGNOSIS — E538 Deficiency of other specified B group vitamins: Secondary | ICD-10-CM

## 2019-05-05 ENCOUNTER — Ambulatory Visit: Payer: Medicare Other | Attending: Internal Medicine

## 2019-05-05 DIAGNOSIS — Z20822 Contact with and (suspected) exposure to covid-19: Secondary | ICD-10-CM

## 2019-05-06 LAB — NOVEL CORONAVIRUS, NAA: SARS-CoV-2, NAA: NOT DETECTED

## 2019-05-18 ENCOUNTER — Other Ambulatory Visit: Payer: Self-pay | Admitting: Nurse Practitioner

## 2019-05-18 DIAGNOSIS — R059 Cough, unspecified: Secondary | ICD-10-CM

## 2019-05-18 DIAGNOSIS — R05 Cough: Secondary | ICD-10-CM

## 2019-05-24 ENCOUNTER — Telehealth: Payer: Self-pay | Admitting: Nurse Practitioner

## 2019-05-24 ENCOUNTER — Other Ambulatory Visit: Payer: Self-pay | Admitting: *Deleted

## 2019-05-24 DIAGNOSIS — G8929 Other chronic pain: Secondary | ICD-10-CM

## 2019-05-24 MED ORDER — OXYCODONE-ACETAMINOPHEN 5-325 MG PO TABS: 1.0000 | ORAL_TABLET | Freq: Two times a day (BID) | ORAL | 0 refills | Status: DC | PRN

## 2019-05-24 NOTE — Telephone Encounter (Signed)
Patient requested refill [place]CCS[place] Database Verified LR: 04/20/2019 Narcotic Contract Updated Pended Rx and sent to [facility] for approval.

## 2019-06-14 ENCOUNTER — Other Ambulatory Visit: Payer: Self-pay

## 2019-06-14 ENCOUNTER — Telehealth: Payer: Medicare Other | Admitting: Nurse Practitioner

## 2019-06-21 ENCOUNTER — Other Ambulatory Visit: Payer: Self-pay | Admitting: *Deleted

## 2019-06-21 DIAGNOSIS — G8929 Other chronic pain: Secondary | ICD-10-CM

## 2019-06-21 NOTE — Telephone Encounter (Signed)
Patient called requesting refill NCCSRS Database Verified LR: 05/24/2019 Narcotic Contract updated Pended Rx and sent to [facility] for approval.

## 2019-06-22 MED ORDER — OXYCODONE-ACETAMINOPHEN 5-325 MG PO TABS: 1.0000 | ORAL_TABLET | Freq: Two times a day (BID) | ORAL | 0 refills | Status: DC | PRN

## 2019-06-26 ENCOUNTER — Encounter: Payer: Self-pay | Admitting: Nurse Practitioner

## 2019-06-26 ENCOUNTER — Telehealth (INDEPENDENT_AMBULATORY_CARE_PROVIDER_SITE_OTHER): Payer: Medicare Other | Admitting: Nurse Practitioner

## 2019-06-26 ENCOUNTER — Other Ambulatory Visit: Payer: Self-pay

## 2019-06-26 DIAGNOSIS — M5441 Lumbago with sciatica, right side: Secondary | ICD-10-CM

## 2019-06-26 DIAGNOSIS — G8929 Other chronic pain: Secondary | ICD-10-CM | POA: Diagnosis not present

## 2019-06-26 MED ORDER — OXYCODONE-ACETAMINOPHEN 5-325 MG PO TABS: 1.0000 | ORAL_TABLET | Freq: Three times a day (TID) | ORAL | 0 refills | Status: DC | PRN

## 2019-06-26 NOTE — Progress Notes (Signed)
This service is provided via telemedicine  No vital signs collected/recorded due to the encounter was a telemedicine visit.   Location of patient (ex: home, work):  Home   Patient consents to a telephone visit:  Yes  Location of the provider (ex: office, home):  [facility], Office   Name of any referring provider:  N/A  Names of all persons participating in the telemedicine service and their role in the encounter: Laura [name] B/CMA, Laura Mustache, NP, and Patient   Time spent on call:  7 min with medical assistant       Careteam: Patient Care Team: Laura Chandler, NP as PCP - General (Geriatric Medicine)  PLACE OF SERVICE:  [place]  Advanced Directive information    Allergies  Allergen Reactions  . Aspirin Hives  . Penicillins Hives    Chief Complaint  Patient presents with  . Medication Management    Discuss medications. Telehealth/Video Visit   . Medication Management    Stopped Cymbalta 1 week ago, ineffective     HPI: Patient is a 59 y.o. female to discuss pain medication.  Reports when pt reduced pain medication from oxycodone 10/325 mg to oxycodone 5/325 he was not able to go longer and now having to lay down due to pain.  Reports back is so hard with increase in pain. Not getting relief with current regimen.  Reports supplemented with tylenol and was not effective.   Reports Cymbalta 30 mg tablet in combination with the oxycodone 5/325 mg was making him nausea therefore he stopped.   Feeling more unsteady due to pain. Reports pain is radiating down into right leg.  Reports he did not have pain going down leg prior to dose reduction and there has been no other injury or event. No loss of bowel or bladder Reports he used lyrica and gabapentin and "that does not do anything for me"  Pain 9/10-- and has to lay down at this point   Continues to smoke but Has quit drinking alcohol    Review of Systems:  Review of Systems    Constitutional: Negative for chills, fever and malaise/fatigue.  Gastrointestinal: Negative for constipation, diarrhea and nausea.  Musculoskeletal: Positive for back pain, joint pain and myalgias.  Psychiatric/Behavioral: Negative for depression. The patient is not nervous/anxious.     Past Medical History:  Diagnosis Date  . Acute bronchitis   . Cervical high risk HPV (human papillomavirus) test positive 12/2017   Negative subtype 16, 18/45  . Chronic back pain   . Complication of anesthesia    "didn't wake up"  . Cough   . Insomnia, unspecified   . Other malaise and fatigue   . Other specified diseases of blood and blood-forming organs(289.89)   . Tobacco use disorder    Past Surgical History:  Procedure Laterality Date  . ESOPHAGOGASTRODUODENOSCOPY N/A 03/17/2013   Procedure: ESOPHAGOGASTRODUODENOSCOPY (EGD);  Surgeon: Ladene Artist, MD;  Location: [facility] ENDOSCOPY;  Service: Endoscopy;  Laterality: N/A;  . FRACTURE SURGERY    . TONSILLECTOMY     Social History:   reports that she has been smoking cigarettes. She has a 6.00 pack-year smoking history. She has never used smokeless tobacco. She reports previous alcohol use. She reports that she does not use drugs.  Family History  Problem Relation Age of Onset  . Arthritis Mother   . Hypertension Sister   . Hypertension Brother   . Hypertension Sister   . Hypertension Sister   . Hypertension  Sister     Medications: Patient's Medications  New Prescriptions   No medications on file  Previous Medications   ALBUTEROL (VENTOLIN HFA) 108 (90 BASE) MCG/ACT INHALER    INHALE 2 PUFFS INTO THE LUNGS EVERY 6 HOURS AS NEEDED FOR WHEEZING OR SHORTNESS OF BREATH   DULOXETINE (CYMBALTA) 30 MG CAPSULE    Take 1 capsule (30 mg total) by mouth daily.   FOLIC ACID (FOLVITE) 1 MG TABLET    Take 1 mg by mouth daily.   LOSARTAN (COZAAR) 25 MG TABLET    Take 1 tablet (25 mg total) by mouth daily.   OXYCODONE-ACETAMINOPHEN  (PERCOCET/ROXICET) 5-325 MG TABLET    Take 1 tablet by mouth 2 (two) times daily as needed for severe pain.  Modified Medications   No medications on file  Discontinued Medications   ACETAMINOPHEN (TYLENOL) 500 MG TABLET    Take 500 mg by mouth as needed.   LEVOFLOXACIN (LEVAQUIN) 500 MG TABLET    Take 1 tablet (500 mg total) by mouth daily.    Physical Exam:  There were no vitals filed for this visit. There is no height or weight on file to calculate BMI. Wt Readings from Last 3 Encounters:  04/17/19 217 lb (98.4 kg)  03/07/19 223 lb 3.2 oz (101.2 kg)  02/08/19 223 lb 3.2 oz (101.2 kg)     Labs reviewed: Basic Metabolic Panel: Recent Labs    02/17/19 1102  NA 137  K 3.7  CL 103  CO2 25  GLUCOSE 95  BUN 8  CREATININE 0.59  CALCIUM 9.4   Liver Function Tests: Recent Labs    02/17/19 1102  AST 22  ALT 14  BILITOT 0.8  PROT 6.8   No results for input(s): LIPASE, AMYLASE in the last 8760 hours. No results for input(s): AMMONIA in the last 8760 hours. CBC: Recent Labs    02/17/19 1102  WBC 4.5  NEUTROABS 2,691  HGB 13.1  HCT 36.8  MCV 112.2*  PLT 154   Lipid Panel: Recent Labs    02/17/19 1102  CHOL 184  HDL 93  LDLCALC 76  TRIG 71  CHOLHDL 2.0   TSH: No results for input(s): TSH in the last 8760 hours. A1C: Lab Results  Component Value Date   HGBA1C 4.8 07/01/2015     Assessment/Plan 1. Chronic right-sided low back pain with right-sided sciatica -was able to cut back on medication with addition of Cymbalta, reports with decrease in oxycodone to 5- 325 mg unable to tolerate with cymbalta- made nausea- suspect there may be some withdrawal? -agreeable to further evaluation with pain management and possible injections if needed as well as PT to help with pain control.  -can not tolerate only taking twice daily due to severity of the pain. Will increase back to TID with tylenol to supplement. Max tylenol 3000 mg in 24 hours.  - Ambulatory referral  to Pain Clinic - oxyCODONE-acetaminophen (PERCOCET/ROXICET) 5-325 MG tablet; Take 1 tablet by mouth every 8 (eight) hours as needed for severe pain.  Dispense: 90 tablet; Refill: 0 - Ambulatory referral to Physical Therapy  Next appt: 08/11/2019 as scheduled  Laura [name] K. Laura [name]  [facility] Adult Medicine [phone]    Virtual Visit via Video Note  I connected with Laura [name] on 06/29/19 at  3:45 PM EST by a video enabled telemedicine application and verified that I am speaking with the correct person using two identifiers.  Location: Patient: home Provider: office  I discussed the limitations of evaluation and management by telemedicine and the availability of in person appointments. The patient expressed understanding and agreed to proceed.    I discussed the assessment and treatment plan with the patient. The patient was provided an opportunity to ask questions and all were answered. The patient agreed with the plan and demonstrated an understanding of the instructions.   The patient was advised to call back or seek an in-person evaluation if the symptoms worsen or if the condition fails to improve as anticipated.  I provided 20 minutes of non-face-to-face time during this encounter.  Laura [name]. Janyth Contes, AGNP Avs printed and mailed.

## 2019-07-14 ENCOUNTER — Telehealth (INDEPENDENT_AMBULATORY_CARE_PROVIDER_SITE_OTHER): Payer: Medicare Other | Admitting: Nurse Practitioner

## 2019-07-14 ENCOUNTER — Other Ambulatory Visit: Payer: Self-pay

## 2019-07-14 ENCOUNTER — Encounter: Payer: Self-pay | Admitting: Nurse Practitioner

## 2019-07-14 DIAGNOSIS — J060 Acute laryngopharyngitis: Secondary | ICD-10-CM | POA: Diagnosis not present

## 2019-07-14 DIAGNOSIS — M5441 Lumbago with sciatica, right side: Secondary | ICD-10-CM

## 2019-07-14 DIAGNOSIS — G8929 Other chronic pain: Secondary | ICD-10-CM | POA: Diagnosis not present

## 2019-07-14 MED ORDER — MAGIC MOUTHWASH W/LIDOCAINE: 5.0000 mL | Freq: Three times a day (TID) | ORAL | 0 refills | Status: DC | PRN

## 2019-07-14 NOTE — Progress Notes (Signed)
This service is provided via telemedicine  No vital signs collected/recorded due to the encounter was a telemedicine visit.   Location of patient (ex: home, work):  Home  Patient consents to a telephone visit:  Yes  Location of the provider (ex: office, home):  [facility], Office    Name of any referring provider: N/A  Names of all persons participating in the telemedicine service and their role in the encounter: Laura [name], Laura Mustache, NP, and Patient   Time spent on call:  7 min with medical assistant       Careteam: Patient Care Team: Laura Chandler, NP as PCP - General (Geriatric Medicine)  PLACE OF SERVICE:  [place] Directive information Does Patient Have a Medical Advance Directive?: No, Would patient like information on creating a medical advance directive?: No - Patient declined  Allergies  Allergen Reactions  . Aspirin Hives  . Penicillins Hives    Chief Complaint  Patient presents with  . Acute Visit    Cough,irritated throat. among other things he wants to discuss. Telehealth/video visit      HPI: Patient is a 59 y.o. female due to cough and throat irritated. Reports this happened in 2014. Reports it was sore but now it is just scratchy. Has been going on for 1-2 weeks.  Reports chills, no fever.  Can not sleep at night due to irritation.  Using throat lozenges help the cough. Has used chloraseptic spray.  No discoloration or white patches Also with chest congestion Continues to smoke  Treated with levaquin in January, reports he cough really got better.   Chronic back pain- PT called and recommended acupuncture (dry needling) but he did not want dry needling.  Referral said that pt declined PT but pt reports she did NOT decline all of physical therapy and was interested in doing this to help with pain management  Pain medicine declined pt- reports not a candidate    Review of Systems:  Review of Systems    Constitutional: Positive for malaise/fatigue.  HENT: Positive for sore throat. Negative for congestion, ear discharge, ear pain, hearing loss, nosebleeds, sinus pain and tinnitus.   Respiratory: Positive for cough. Negative for shortness of breath and wheezing.     Past Medical History:  Diagnosis Date  . Acute bronchitis   . Cervical high risk HPV (human papillomavirus) test positive 12/2017   Negative subtype 16, 18/45  . Chronic back pain   . Complication of anesthesia    "didn't wake up"  . Cough   . Insomnia, unspecified   . Other malaise and fatigue   . Other specified diseases of blood and blood-forming organs(289.89)   . Tobacco use disorder    Past Surgical History:  Procedure Laterality Date  . ESOPHAGOGASTRODUODENOSCOPY N/A 03/17/2013   Procedure: ESOPHAGOGASTRODUODENOSCOPY (EGD);  Surgeon: Ladene Artist, MD;  Location: [facility] ENDOSCOPY;  Service: Endoscopy;  Laterality: N/A;  . FRACTURE SURGERY    . TONSILLECTOMY     Social History:   reports that she has been smoking cigarettes. She has a 6.00 pack-year smoking history. She has never used smokeless tobacco. She reports previous alcohol use. She reports that she does not use drugs.  Family History  Problem Relation Age of Onset  . Arthritis Mother   . Hypertension Sister   . Hypertension Brother   . Hypertension Sister   . Hypertension Sister   . Hypertension Sister     Medications: Patient's Medications  New  Prescriptions   No medications on file  Previous Medications   ALBUTEROL (VENTOLIN HFA) 108 (90 BASE) MCG/ACT INHALER    INHALE 2 PUFFS INTO THE LUNGS EVERY 6 HOURS AS NEEDED FOR WHEEZING OR SHORTNESS OF BREATH   FOLIC ACID (FOLVITE) 1 MG TABLET    Take 1 mg by mouth daily.   LOSARTAN (COZAAR) 25 MG TABLET    Take 1 tablet (25 mg total) by mouth daily.   OXYCODONE-ACETAMINOPHEN (PERCOCET/ROXICET) 5-325 MG TABLET    Take 1 tablet by mouth every 8 (eight) hours as needed for severe pain.  Modified  Medications   No medications on file  Discontinued Medications   No medications on file    Physical Exam:  There were no vitals filed for this visit. There is no height or weight on file to calculate BMI. Wt Readings from Last 3 Encounters:  04/17/19 217 lb (98.4 kg)  03/07/19 223 lb 3.2 oz (101.2 kg)  02/08/19 223 lb 3.2 oz (101.2 kg)      Labs reviewed: Basic Metabolic Panel: Recent Labs    02/17/19 1102  NA 137  K 3.7  CL 103  CO2 25  GLUCOSE 95  BUN 8  CREATININE 0.59  CALCIUM 9.4   Liver Function Tests: Recent Labs    02/17/19 1102  AST 22  ALT 14  BILITOT 0.8  PROT 6.8   No results for input(s): LIPASE, AMYLASE in the last 8760 hours. No results for input(s): AMMONIA in the last 8760 hours. CBC: Recent Labs    02/17/19 1102  WBC 4.5  NEUTROABS 2,691  HGB 13.1  HCT 36.8  MCV 112.2*  PLT 154   Lipid Panel: Recent Labs    02/17/19 1102  CHOL 184  HDL 93  LDLCALC 76  TRIG 71  CHOLHDL 2.0   TSH: No results for input(s): TSH in the last 8760 hours. A1C: Lab Results  Component Value Date   HGBA1C 4.8 07/01/2015     Assessment/Plan 1. Sore throat and laryngitis -also with cough however suspect cough is chronic due to smoking, supportive care for sore throat.  -if no improvement or worsening of symptoms needs to be seen in office -smoking cessation recommended  - magic mouthwash w/lidocaine SOLN; Take 5 mLs by mouth 3 (three) times daily as needed for mouth pain.  Dispense: 240 mL; Refill: 0  2. Chronic right-sided low back pain with right-sided sciatica -ongoing, did not tolerate reduction of pain medication therefore it was increased with agreement to start PT and have evaluation by neurosurgery for possible injections.  -will place referrals again at this time (second attempt) and discussed with referral coordinator. Laura Needle originally did not want to see neurosurgery but pain management has declined him at this time. He is agreeable  for back injections if this could possibly improve pain.  - Ambulatory referral to Physical Therapy - Ambulatory referral to Neurosurgery  Laura [name] K. Laura [name]  [facility] [phone]   Virtual Visit via Video Note  I connected with Laura [name] on 07/18/19 at 10:00 AM EDT by a video enabled telemedicine application and verified that I am speaking with the correct person using two identifiers.  Location: Patient: home Provider: office    I discussed the limitations of evaluation and management by telemedicine and the availability of in person appointments. The patient expressed understanding and agreed to proceed.    I discussed the assessment and treatment plan with the patient. The patient was provided  an opportunity to ask questions and all were answered. The patient agreed with the plan and demonstrated an understanding of the instructions.   The patient was advised to call back or seek an in-person evaluation if the symptoms worsen or if the condition fails to improve as anticipated.  I provided 18 minutes of non-face-to-face time during this encounter.  Janene Harvey. Janyth Contes, AGNP Avs printed and mailed.

## 2019-07-17 ENCOUNTER — Telehealth: Payer: Self-pay | Admitting: *Deleted

## 2019-07-17 NOTE — Telephone Encounter (Signed)
Misty Stanley brought back a Rx to me that was in the "To be Faxed box" for [employer]. Misty Stanley asked Shanda Bumps what to do with it and she stated that it should have been faxed to the pharmacy on Friday.  Misty Stanley brought it to me and asked what to do with it.  I faxed it to patient's pharmacy-[facility] Fax: [phone]

## 2019-07-18 ENCOUNTER — Telehealth: Payer: Self-pay

## 2019-07-18 NOTE — Telephone Encounter (Signed)
Incoming call received from patient. Patient states he tried to get rx for mouthwash and the pharmacy said they are still reviewing, patient called to ask what does it mean they are still reviewing.  Patient aware I will call the pharmacy and get a status update and call him back shortly.  Outgoing call placed to [facility] on [address], Rx has to be compounded and was supposed to be compounded over night yet it was not. RX should be available today. Per pharmacist if patient can give them a couple of hours it will be available today.   I called patient with a status update and patient verbalized understanding

## 2019-07-22 ENCOUNTER — Other Ambulatory Visit: Payer: Self-pay | Admitting: Nurse Practitioner

## 2019-07-22 DIAGNOSIS — I10 Essential (primary) hypertension: Secondary | ICD-10-CM

## 2019-07-24 ENCOUNTER — Other Ambulatory Visit: Payer: Self-pay

## 2019-07-24 DIAGNOSIS — M48062 Spinal stenosis, lumbar region with neurogenic claudication: Secondary | ICD-10-CM | POA: Diagnosis not present

## 2019-07-24 DIAGNOSIS — G8929 Other chronic pain: Secondary | ICD-10-CM

## 2019-07-24 DIAGNOSIS — R030 Elevated blood-pressure reading, without diagnosis of hypertension: Secondary | ICD-10-CM | POA: Diagnosis not present

## 2019-07-24 DIAGNOSIS — M4316 Spondylolisthesis, lumbar region: Secondary | ICD-10-CM | POA: Diagnosis not present

## 2019-07-24 MED ORDER — OXYCODONE-ACETAMINOPHEN 5-325 MG PO TABS: ORAL_TABLET | ORAL | 0 refills | Status: DC

## 2019-07-24 NOTE — Telephone Encounter (Signed)
Patient called and states refill is needed for "Oxycodone". Patient last visit was 07/14/2019. Patient has upcoming appointment 08/18/2019. Patient has Opioid contract on file 04/20/2019. Medication pend and sent to provider. Please Advise.

## 2019-07-24 NOTE — Telephone Encounter (Signed)
Patient called and states that she called Friday but office was closed. Patient states she was unaware of office being closed and wants refill on Oxycodone. Patient last visit was 07/14/2019 and has upcoming  appointment 08/18/2019. Patient has Opioid Contract on file from 04/19/2020. Please Advise.

## 2019-08-04 ENCOUNTER — Ambulatory Visit: Payer: Medicare Other | Admitting: Nurse Practitioner

## 2019-08-07 ENCOUNTER — Ambulatory Visit: Payer: Medicare Other | Attending: Nurse Practitioner | Admitting: Physical Therapy

## 2019-08-07 ENCOUNTER — Other Ambulatory Visit: Payer: Self-pay

## 2019-08-07 DIAGNOSIS — R2689 Other abnormalities of gait and mobility: Secondary | ICD-10-CM | POA: Diagnosis not present

## 2019-08-07 DIAGNOSIS — M6281 Muscle weakness (generalized): Secondary | ICD-10-CM | POA: Insufficient documentation

## 2019-08-07 DIAGNOSIS — M5440 Lumbago with sciatica, unspecified side: Secondary | ICD-10-CM | POA: Diagnosis not present

## 2019-08-07 DIAGNOSIS — G8929 Other chronic pain: Secondary | ICD-10-CM | POA: Diagnosis not present

## 2019-08-07 NOTE — Patient Instructions (Signed)
Access Code: [email or web address] URL: [email or web address] Date: 08/07/2019 Prepared by: Rosana Hoes  Exercises Hooklying Single Knee to Chest Stretch - 2 x daily - 7 x weekly - 2 reps - 30 seconds hold Supine March - 2 x daily - 7 x weekly - 10 reps Supine Lower Trunk Rotation - 2 x daily - 7 x weekly - 10 reps - 5 seconds hold Supine Pelvic Tilt - 2 x daily - 7 x weekly - 20 reps Hooklying Clamshell with Resistance - 2 x daily - 7 x weekly - 20 reps Seated Long Arc Quad - 2 x daily - 7 x weekly - 20 reps
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
## 2019-08-11 ENCOUNTER — Other Ambulatory Visit: Payer: Medicare Other

## 2019-08-15 ENCOUNTER — Ambulatory Visit: Payer: Medicare Other | Admitting: Physical Therapy

## 2019-08-16 ENCOUNTER — Other Ambulatory Visit: Payer: Medicare Other

## 2019-08-18 ENCOUNTER — Ambulatory Visit: Payer: Medicare Other | Admitting: Nurse Practitioner

## 2019-08-18 ENCOUNTER — Other Ambulatory Visit: Payer: Medicare Other

## 2019-08-22 ENCOUNTER — Other Ambulatory Visit: Payer: Self-pay | Admitting: *Deleted

## 2019-08-22 DIAGNOSIS — G8929 Other chronic pain: Secondary | ICD-10-CM

## 2019-08-22 DIAGNOSIS — M5441 Lumbago with sciatica, right side: Secondary | ICD-10-CM

## 2019-08-22 MED ORDER — OXYCODONE-ACETAMINOPHEN 5-325 MG PO TABS: ORAL_TABLET | ORAL | 0 refills | Status: DC

## 2019-08-22 NOTE — Telephone Encounter (Signed)
#  75 is a month supply. Refill provided.

## 2019-08-22 NOTE — Telephone Encounter (Signed)
Patient requested refill [place] Verified LR: 07/24/2019 #75 (25 day supply) Contract updated.  Pended Rx and sent to [facility] for approval.

## 2019-08-23 ENCOUNTER — Encounter: Payer: Self-pay | Admitting: Physical Therapy

## 2019-08-23 ENCOUNTER — Ambulatory Visit: Payer: Medicare Other | Attending: Nurse Practitioner | Admitting: Physical Therapy

## 2019-08-23 ENCOUNTER — Other Ambulatory Visit: Payer: Self-pay

## 2019-08-23 DIAGNOSIS — M5440 Lumbago with sciatica, unspecified side: Secondary | ICD-10-CM | POA: Diagnosis not present

## 2019-08-23 DIAGNOSIS — R2689 Other abnormalities of gait and mobility: Secondary | ICD-10-CM | POA: Diagnosis not present

## 2019-08-23 DIAGNOSIS — M6281 Muscle weakness (generalized): Secondary | ICD-10-CM

## 2019-08-23 DIAGNOSIS — G8929 Other chronic pain: Secondary | ICD-10-CM | POA: Diagnosis not present

## 2019-08-23 NOTE — Therapy (Signed)
[facility] [address] [place], [place], [address] Phone: [phone]   Fax:  [phone]  Physical Therapy Treatment  Patient Details  Name: Laura [name] MRN: [id number] Date of Birth: [birth year] Referring Provider (PT): Sharon Seller, NP   Encounter Date: 08/23/2019  PT End of Session - 08/23/19 1437    Visit Number  2    Number of Visits  8    Date for PT Re-Evaluation  10/02/19    Authorization Type  [employer] MCR    PT Start Time  1435    PT Stop Time  1515    PT Time Calculation (min)  40 min    Activity Tolerance  Patient limited by pain    Behavior During Therapy  [facility] for tasks assessed/performed       Past Medical History:  Diagnosis Date  . Acute bronchitis   . Cervical high risk HPV (human papillomavirus) test positive 12/2017   Negative subtype 16, 18/45  . Chronic back pain   . Complication of anesthesia    "didn't wake up"  . Cough   . Insomnia, unspecified   . Other malaise and fatigue   . Other specified diseases of blood and blood-forming organs(289.89)   . Tobacco use disorder     Past Surgical History:  Procedure Laterality Date  . ESOPHAGOGASTRODUODENOSCOPY N/A 03/17/2013   Procedure: ESOPHAGOGASTRODUODENOSCOPY (EGD);  Surgeon: Meryl Dare, MD;  Location: [facility] ENDOSCOPY;  Service: Endoscopy;  Laterality: N/A;  . FRACTURE SURGERY    . TONSILLECTOMY      There were no vitals filed for this visit.  Subjective Assessment - 08/23/19 1439    Subjective  Patient reports same pain level. She does the exercise about every day. She missed last appointment because she was out of town.    Patient Stated Goals  Patient would like to get rid of pain and improve ability to exercise and move more    Currently in Pain?  Yes    Pain Score  8     Pain Location  Back    Pain Orientation  Lower    Pain Descriptors / Indicators  --   "just hurting"   Pain Type  Chronic pain    Pain Radiating Towards  down  back of left leg    Pain Onset  More than a month ago    Pain Frequency  Constant    Aggravating Factors   Standing, walking    Pain Relieving Factors  Nothing         [facility] PT Assessment - 08/23/19 0001      AROM   Lumbar Extension  0% - patient uable to perform any lumbar extension from neutral position      Palpation   Palpation comment  TTP grossly over entire lumbar region with light palpation                   [facility] Adult PT Treatment/Exercise - 08/23/19 0001      Exercises   Exercises  Lumbar   bolster under trunk for all supine exercises     Lumbar Exercises: Stretches   Passive Hamstring Stretch  2 reps;20 seconds    Single Knee to Chest Stretch  2 reps;20 seconds    Lower Trunk Rotation  5 reps;10 seconds    Hip Flexor Stretch  2 reps;20 seconds    Hip Flexor Stretch Limitations  supine edge of mat      Lumbar  Exercises: Supine   Pelvic Tilt  5 reps    Clam  10 reps   2 sets   Clam Limitations  red band    Bent Knee Raise  10 reps   2 sets   Bent Knee Raise Limitations  marching    Bridge  5 reps   2 sets   Bridge Limitations  partial range    Straight Leg Raise  5 reps   2 sets     Lumbar Exercises: Prone   Other Prone Lumbar Exercises  Trialed prone lying to improve reduce flexed posture - unable to tolerate             PT Education - 08/23/19 1437    Education Details  HEP    Person(s) Educated  Patient    Methods  Explanation;Demonstration;Tactile cues;Verbal cues    Comprehension  Verbalized understanding;Returned demonstration;Verbal cues required;Tactile cues required;Need further instruction       PT Short Term Goals - 08/07/19 1727      PT SHORT TERM GOAL #1   Title  Patient will be I with initial HEP to progress in PT    Time  4    Period  Weeks    Status  New    Target Date  09/04/19      PT SHORT TERM GOAL #2   Title  Patient will be able to walk household distances with only moderate to little difficulty  using LRAD    Time  4    Period  Weeks    Status  New    Target Date  09/04/19      PT SHORT TERM GOAL #3   Title  Patient will report moderate to little difficulty performing sit<>stand task    Time  4    Period  Weeks    Status  New    Target Date  09/04/19      PT SHORT TERM GOAL #4   Title  Patient will report resting pain level </= 5/10 to improve functional ability    Time  4    Period  Weeks    Status  New    Target Date  09/04/19        PT Long Term Goals - 08/07/19 1732      PT LONG TERM GOAL #1   Title  Patient will be I with final HEP to maintain progress from PT    Time  8    Period  Weeks    Status  New    Target Date  10/02/19      PT LONG TERM GOAL #2   Title  Patient will report improved function of </= 56% limitation on FOTO    Time  8    Period  Weeks    Status  New    Target Date  10/02/19      PT LONG TERM GOAL #3   Title  Patient will be able to ambulate community level distances using LRAD with moderate to little difficulty to be able to grocery shop    Time  8    Period  Weeks    Status  New    Target Date  10/02/19      PT LONG TERM GOAL #4   Title  Patient will be able to perform usual housework without moderate to little difficulty    Time  8    Period  Weeks    Status  New  Target Date  10/02/19      PT LONG TERM GOAL #5   Title  Patient will report resting pain level </= 3/10 and pain with activity 75% of the time </= 5/10 to allow for improved mobility    Time  8    Period  Weeks    Status  New    Target Date  10/02/19            Plan - 08/23/19 1438    Clinical Impression Statement  Patient continues to report high levels of pain and is not able to tolerate much exercise due to this. Trialed having patient lying prone and she was not able to tolerate for longer than a few minutes. She reported generalized tenderness of lower back with light palpation. Progressed strengthening and stretching as able this visit. She is  going to have an MRI scheduled in the near future and will follow-up with her neurosugeon at that point. She would benefit from continued skilled PT to progress motion and strength as able to improve mobility and reduce pain with activities such as standing and walking.    PT Treatment/Interventions  ADLs/Self Care Home Management;Cryotherapy;Electrical Stimulation;Moist Heat;Neuromuscular re-education;Balance training;Therapeutic exercise;Therapeutic activities;Functional mobility training;Stair training;Gait training;Patient/family education;Manual techniques;Dry needling;Passive range of motion;Taping;Spinal Manipulations;Joint Manipulations    PT Next Visit Plan  Assess HEP and progress PRN, gentle lumbar and hip stretch as patient is able to tolerate, progress light core and hip strengthening    PT Home Exercise Plan  [id number]: (use pillows under trunk for all supine exercises), SKTC stretch, supine marching, LTR, supine clamshell with yellow, supine pelvic tilts, LAQ, supine hip flexor stretch off edge of bed    Consulted and Agree with Plan of Care  Patient       Patient will benefit from skilled therapeutic intervention in order to improve the following deficits and impairments:  Abnormal gait, Difficulty walking, Decreased range of motion, Decreased activity tolerance, Pain, Improper body mechanics, Impaired flexibility, Decreased strength, Postural dysfunction, Decreased endurance  Visit Diagnosis: Chronic bilateral low back pain with sciatica, sciatica laterality unspecified  Muscle weakness (generalized)  Other abnormalities of gait and mobility     Problem List Patient Active Problem List   Diagnosis Date Noted  . Estrogen deficiency 09/30/2018  . Morbid obesity ([place]) 04/12/2018  . Macrocytic anemia 04/12/2018  . Tobacco abuse 04/12/2018  . Essential hypertension 04/12/2018  . Body mass index (BMI) of 37.0-37.9 in adult 04/12/2018  . Obesity (BMI 30.0-34.9) 09/29/2016  .  Low back pain 10/07/2015  . Chronic night sweats 03/03/2015  . Generalized anxiety disorder 06/07/2014  . Allergic rhinitis 03/02/2013  . Panic attack 09/05/2012  . Insomnia 07/28/2012  . Depression 07/28/2012  . OA (osteoarthritis) of knee 07/28/2012    Hilda Blades, PT, DPT, LAT, ATC 08/23/19  3:25 PM Phone: [phone] Fax: [facility] [place] [facility] [address] [place], [place], [address] Phone: [phone]   Fax:  [phone]  Name: Tobin Cadiente MRN: [id number] Date of Birth: 1960/08/06

## 2019-08-25 ENCOUNTER — Other Ambulatory Visit: Payer: Self-pay | Admitting: Neurosurgery

## 2019-08-25 DIAGNOSIS — M48062 Spinal stenosis, lumbar region with neurogenic claudication: Secondary | ICD-10-CM

## 2019-08-29 ENCOUNTER — Ambulatory Visit: Payer: Medicare Other | Admitting: Physical Therapy

## 2019-08-30 ENCOUNTER — Ambulatory Visit: Payer: Medicare Other | Admitting: Physical Therapy

## 2019-09-06 ENCOUNTER — Other Ambulatory Visit: Payer: Self-pay

## 2019-09-06 ENCOUNTER — Encounter: Payer: Self-pay | Admitting: Physical Therapy

## 2019-09-06 ENCOUNTER — Ambulatory Visit: Payer: Medicare Other | Admitting: Physical Therapy

## 2019-09-06 DIAGNOSIS — G8929 Other chronic pain: Secondary | ICD-10-CM | POA: Diagnosis not present

## 2019-09-06 DIAGNOSIS — M6281 Muscle weakness (generalized): Secondary | ICD-10-CM

## 2019-09-06 DIAGNOSIS — R2689 Other abnormalities of gait and mobility: Secondary | ICD-10-CM | POA: Diagnosis not present

## 2019-09-06 DIAGNOSIS — M5440 Lumbago with sciatica, unspecified side: Secondary | ICD-10-CM

## 2019-09-06 NOTE — Therapy (Addendum)
[place], [place], [address] Phone: [phone]   Fax:  [phone]  Physical Therapy Treatment  Patient Details  Name: Laura [name] MRN: [id number] Date of Birth: 05-28-1960 Referring Provider (PT): Lauree Chandler, NP   Encounter Date: 09/06/2019  PT End of Session - 09/06/19 1456    Visit Number  3    Number of Visits  8    Date for PT Re-Evaluation  10/02/19    Authorization Type  [employer]HC MCR    PT Start Time  9480    PT Stop Time  1525    PT Time Calculation (min)  40 min    Activity Tolerance  Patient limited by pain    Behavior During Therapy  [facility] for tasks assessed/performed       Past Medical History:  Diagnosis Date  . Acute bronchitis   . Cervical high risk HPV (human papillomavirus) test positive 12/2017   Negative subtype 16, 18/45  . Chronic back pain   . Complication of anesthesia    "didn't wake up"  . Cough   . Insomnia, unspecified   . Other malaise and fatigue   . Other specified diseases of blood and blood-forming organs(289.89)   . Tobacco use disorder     Past Surgical History:  Procedure Laterality Date  . ESOPHAGOGASTRODUODENOSCOPY N/A 03/17/2013   Procedure: ESOPHAGOGASTRODUODENOSCOPY (EGD);  Surgeon: Ladene Artist, MD;  Location: [facility];  Service: Endoscopy;  Laterality: N/A;  . FRACTURE SURGERY    . TONSILLECTOMY      There were no vitals filed for this visit.  Subjective Assessment - 09/06/19 1451    Subjective  Patient reports no real improvement in pain level and continues to be limited with standing and walking.    Limitations  Standing;Walking;House hold activities;Lifting;Sitting    Diagnostic tests  MRI scheduled for 09/23/2019    Patient Stated Goals  Patient would like to get rid of pain and improve ability to exercise and move more    Currently in Pain?  Yes    Pain Score  8    8.5   Pain Location  Back    Pain Orientation  Lower    Pain  Descriptors / Indicators  --   "just hurting"   Pain Type  Chronic pain    Pain Radiating Towards  down the back of both legs    Pain Onset  More than a month ago    Pain Frequency  Constant    Aggravating Factors   standing to long    Pain Relieving Factors  Nothing    Effect of Pain on Daily Activities  Patient is limited in how far can walk and time on feet         [facility] PT Assessment - 09/06/19 0001      Assessment   Medical Diagnosis  Chronic right-sided low back pain    Referring Provider (PT)  Lauree Chandler, NP      Precautions   Precautions  None      Restrictions   Weight Bearing Restrictions  No      Balance Screen   Has the patient fallen in the past 6 months  No      Prior Function   Level of Independence  Independent      Observation/Other Assessments   Observations  Patient appears in no apparent distress    Focus on Therapeutic Outcomes (FOTO)   74% limitation  Posture/Postural Control   Posture Comments  Patient exhibits a forward flexed trunk posture, rounded shoulder and forward head      AROM   Overall AROM Comments  Patient reports increased low back pain with AROM, does not seem to indicate directional preference    Lumbar Flexion  75% - fingertips to mid shin    Lumbar Extension  0% - patient unable to perform any standing lumbar extension from neutral position      Strength   Right Hip Extension  3-/5    Right Hip ABduction  3-/5    Left Hip Extension  3-/5    Left Hip ABduction  3-/5      Flexibility   Soft Tissue Assessment /Muscle Length  yes    Hamstrings  Limited bilat - report of radicular pain with testing mainly on left    Quadriceps  Tightness of bilat hip flexor                    [facility] Adult PT Treatment/Exercise - 09/06/19 0001      Exercises   Exercises  Lumbar      Lumbar Exercises: Stretches   Lower Trunk Rotation  5 reps;10 seconds   2 sets   Hip Flexor Stretch  2 reps;30 seconds    Hip Flexor  Stretch Limitations  supine edge of mat      Lumbar Exercises: Seated   Long Arc Quad on Chair  2 sets;10 reps    Other Seated Lumbar Exercises  Lumbar flexion stretch with swiss ball forward roll 2x10      Lumbar Exercises: Supine   Pelvic Tilt  10 reps   2 sets   Clam  10 reps   2 sets   Clam Limitations  yellow band    Bent Knee Raise  10 reps   2 sets   Bent Knee Raise Limitations  marching      Modalities   Modalities  Moist Heat      Moist Heat Therapy   Number Minutes Moist Heat  20 Minutes   applied while patient performing supine exercises   Moist Heat Location  Lumbar Spine             PT Education - 09/06/19 1455    Education Details  HEP consistency    Person(s) Educated  Patient    Methods  Explanation    Comprehension  Verbalized understanding;Returned demonstration;Verbal cues required;Need further instruction       PT Short Term Goals - 09/06/19 1545      PT SHORT TERM GOAL #1   Title  Patient will be I with initial HEP to progress in PT    Time  4    Period  Weeks    Status  Achieved    Target Date  09/04/19      PT SHORT TERM GOAL #2   Title  Patient will be able to walk household distances with only moderate to little difficulty using LRAD    Baseline  Patient reports limited a lot    Time  4    Period  Weeks    Status  On-going    Target Date  09/04/19      PT SHORT TERM GOAL #3   Title  Patient will report moderate to little difficulty performing sit<>stand task    Baseline  Patient reports limited a lot    Time  4    Period  Weeks  Status  On-going    Target Date  09/04/19      PT SHORT TERM GOAL #4   Title  Patient will report resting pain level </= 5/10 to improve functional ability    Baseline  Patient reports 8/10    Time  4    Period  Weeks    Status  On-going    Target Date  09/04/19        PT Long Term Goals - 08/07/19 1732      PT LONG TERM GOAL #1   Title  Patient will be I with final HEP to maintain  progress from PT    Time  8    Period  Weeks    Status  New    Target Date  10/02/19      PT LONG TERM GOAL #2   Title  Patient will report improved function of </= 56% limitation on FOTO    Time  8    Period  Weeks    Status  New    Target Date  10/02/19      PT LONG TERM GOAL #3   Title  Patient will be able to ambulate community level distances using LRAD with moderate to little difficulty to be able to grocery shop    Time  8    Period  Weeks    Status  New    Target Date  10/02/19      PT LONG TERM GOAL #4   Title  Patient will be able to perform usual housework without moderate to little difficulty    Time  8    Period  Weeks    Status  New    Target Date  10/02/19      PT LONG TERM GOAL #5   Title  Patient will report resting pain level </= 3/10 and pain with activity 75% of the time </= 5/10 to allow for improved mobility    Time  8    Period  Weeks    Status  New    Target Date  10/02/19            Plan - 09/06/19 1502    Clinical Impression Statement  Patient tolerated therapy well but does continue to report high levels of lower back pain with increased lower back discomfort with exercise. Patient notes bilateral posterior leg pain that does seem radicular with positive SLR. Patient also continues to demonstrate limited lumbar mobility and strength with gait impairments and standing activity intolerance. Patient is scheduled to have an MRI on 09/23/2019 so she would like to hold therapy until that time. Patient will continue with exercises and did demonstrate them well with visit. Patient will not be formally discharged from therapy and will contact the clinic to scehdule further visits following the MRI.    PT Treatment/Interventions  ADLs/Self Care Home Management;Cryotherapy;Electrical Stimulation;Moist Heat;Neuromuscular re-education;Balance training;Therapeutic exercise;Therapeutic activities;Functional mobility training;Stair training;Gait  training;Patient/family education;Manual techniques;Dry needling;Passive range of motion;Taping;Spinal Manipulations;Joint Manipulations    PT Next Visit Plan  Assess HEP and progress PRN, gentle lumbar and hip stretching as patient is able to tolerate, progress light core and hip strengthening    PT Home Exercise Plan  [id number]: (use pillows under trunk for all supine exercises), SKTC stretch, supine marching, LTR, supine clamshell with yellow, supine pelvic tilts, LAQ, supine hip flexor stretch off edge of bed    Consulted and Agree with Plan of Care  Patient  Patient will benefit from skilled therapeutic intervention in order to improve the following deficits and impairments:  Abnormal gait, Difficulty walking, Decreased range of motion, Decreased activity tolerance, Pain, Improper body mechanics, Impaired flexibility, Decreased strength, Postural dysfunction, Decreased endurance  Visit Diagnosis: Chronic bilateral low back pain with sciatica, sciatica laterality unspecified  Muscle weakness (generalized)  Other abnormalities of gait and mobility     Problem List Patient Active Problem List   Diagnosis Date Noted  . Estrogen deficiency 09/30/2018  . Morbid obesity ([place]) 04/12/2018  . Macrocytic anemia 04/12/2018  . Tobacco abuse 04/12/2018  . Essential hypertension 04/12/2018  . Body mass index (BMI) of 37.0-37.9 in adult 04/12/2018  . Obesity (BMI 30.0-34.9) 09/29/2016  . Low back pain 10/07/2015  . Chronic night sweats 03/03/2015  . Generalized anxiety disorder 06/07/2014  . Allergic rhinitis 03/02/2013  . Panic attack 09/05/2012  . Insomnia 07/28/2012  . Depression 07/28/2012  . OA (osteoarthritis) of knee 07/28/2012    Hilda Blades, PT, DPT, LAT, ATC 09/06/19  3:54 PM Phone: [phone] Fax: [facility] [address] [place], [place], [address] Phone: [phone]   Fax:   [phone]  Name: Laura [name] MRN: [id number] Date of Birth: [birth year]   PHYSICAL THERAPY DISCHARGE SUMMARY  Visits from Start of Care: 3  Current functional level related to goals / functional outcomes: See above   Remaining deficits: See above   Education / Equipment: HEP Plan: Patient agrees to discharge.  Patient goals were not met. Patient is being discharged due to not returning since the last visit.  ?????    Hilda Blades, PT, DPT, LAT, ATC 11/08/19  3:54 PM Phone: [phone] Fax: [phone]

## 2019-09-22 ENCOUNTER — Other Ambulatory Visit: Payer: Self-pay | Admitting: *Deleted

## 2019-09-22 DIAGNOSIS — G8929 Other chronic pain: Secondary | ICD-10-CM

## 2019-09-22 MED ORDER — OXYCODONE-ACETAMINOPHEN 5-325 MG PO TABS: ORAL_TABLET | ORAL | 0 refills | Status: DC

## 2019-09-22 NOTE — Telephone Encounter (Signed)
Patient requested refill Epic last RF 08/23/19 Pended Rx and sent to [facility] for approval Shanda Bumps out of office)

## 2019-09-23 ENCOUNTER — Inpatient Hospital Stay: Admission: RE | Admit: 2019-09-23 | Payer: Medicare Other | Source: Ambulatory Visit

## 2019-09-25 ENCOUNTER — Encounter: Payer: Self-pay | Admitting: Nurse Practitioner

## 2019-09-25 ENCOUNTER — Other Ambulatory Visit: Payer: Self-pay

## 2019-09-25 ENCOUNTER — Telehealth: Payer: Self-pay

## 2019-09-25 ENCOUNTER — Ambulatory Visit (INDEPENDENT_AMBULATORY_CARE_PROVIDER_SITE_OTHER): Payer: Medicare Other | Admitting: Nurse Practitioner

## 2019-09-25 DIAGNOSIS — E538 Deficiency of other specified B group vitamins: Secondary | ICD-10-CM | POA: Diagnosis not present

## 2019-09-25 DIAGNOSIS — M5441 Lumbago with sciatica, right side: Secondary | ICD-10-CM

## 2019-09-25 DIAGNOSIS — Z720 Tobacco use: Secondary | ICD-10-CM | POA: Diagnosis not present

## 2019-09-25 DIAGNOSIS — F1010 Alcohol abuse, uncomplicated: Secondary | ICD-10-CM | POA: Diagnosis not present

## 2019-09-25 DIAGNOSIS — G8929 Other chronic pain: Secondary | ICD-10-CM

## 2019-09-25 NOTE — Progress Notes (Signed)
This service is provided via telemedicine  No vital signs collected/recorded due to the encounter was a telemedicine visit.   Location of patient (ex: home, work):  Home   Patient consents to a telephone visit:  Yes, see telephone encounter dated 09/25/2019 with annual consent   Location of the provider (ex: office, home):  [facility], Office   Name of any referring provider:  N/A  Names of all persons participating in the telemedicine service and their role in the encounter: [name], Abbey Chatters, NP, and Patient   Time spent on call:  7 min with medical assistant     Careteam: Patient Care Team: Sharon Seller, NP as PCP - General (Geriatric Medicine)  PLACE OF SERVICE:  [facility]  Advanced Directive information    Allergies  Allergen Reactions  . Aspirin Hives  . Penicillins Hives    Chief Complaint  Patient presents with  . Acute Visit    Discuss physical therapy cost and other personal concerns.      HPI: Patient is a 59 y.o. female for follow up.  Reports doing okay but can not afford to go to PT. Reports he is having increased pain with PT and therapist would like to hold off until after MRI which was recommended by neurosurgery.   Plans to get MRI through neurologist. Reports "there is something in the back that he wants to investigate further".  Neurologist did not offer anything until after MRI.   Reports having to take oxycodone frequently- does not work as well at the reduced dose.   htn- 149/64 this morning before medication. Has not taken medication after bp medication.   Mouth and throat doing much better.   ETOH abuse- like ETOH drink was 2 month and when having alcohol it is much less- "tries not to do it" continues on folic acid.   Smoker- 1/2 ppd or less. Using albuterol every 4-5 days.     Review of Systems:  Review of Systems  Constitutional: Negative for chills, fever and weight loss.    HENT: Negative for hearing loss.   Respiratory: Negative for cough, sputum production and shortness of breath.   Cardiovascular: Negative for chest pain, palpitations and leg swelling.  Gastrointestinal: Negative for abdominal pain, constipation, diarrhea and heartburn.  Genitourinary: Negative for dysuria, frequency and urgency.  Musculoskeletal: Positive for back pain. Negative for falls, joint pain and myalgias.  Skin: Negative.   Neurological: Positive for sensory change. Negative for dizziness and headaches.  Psychiatric/Behavioral: Negative for depression and memory loss. The patient does not have insomnia.     Past Medical History:  Diagnosis Date  . Acute bronchitis   . Cervical high risk HPV (human papillomavirus) test positive 12/2017   Negative subtype 16, 18/45  . Chronic back pain   . Complication of anesthesia    "didn't wake up"  . Cough   . Insomnia, unspecified   . Other malaise and fatigue   . Other specified diseases of blood and blood-forming organs(289.89)   . Tobacco use disorder    Past Surgical History:  Procedure Laterality Date  . ESOPHAGOGASTRODUODENOSCOPY N/A 03/17/2013   Procedure: ESOPHAGOGASTRODUODENOSCOPY (EGD);  Surgeon: Meryl Dare, MD;  Location: [facility]COPY;  Service: Endoscopy;  Laterality: N/A;  . FRACTURE SURGERY    . TONSILLECTOMY     Social History:   reports that she has been smoking cigarettes. She has a 6.00 pack-year smoking history. She has never used  smokeless tobacco. She reports previous alcohol use. She reports that she does not use drugs.  Family History  Problem Relation Age of Onset  . Arthritis Mother   . Hypertension Sister   . Hypertension Brother   . Hypertension Sister   . Hypertension Sister   . Hypertension Sister     Medications: Patient's Medications  New Prescriptions   No medications on file  Previous Medications   ALBUTEROL (VENTOLIN HFA) 108 (90 BASE) MCG/ACT INHALER    INHALE 2 PUFFS INTO THE  LUNGS EVERY 6 HOURS AS NEEDED FOR WHEEZING OR SHORTNESS OF BREATH   FOLIC ACID (FOLVITE) 1 MG TABLET    Take 1 mg by mouth daily.   LOSARTAN (COZAAR) 25 MG TABLET    TAKE 1 TABLET(25 MG) BY MOUTH DAILY   OXYCODONE-ACETAMINOPHEN (PERCOCET/ROXICET) 5-325 MG TABLET    Take one tablet by mouth every 8-12 hours as needed for pain.  Modified Medications   No medications on file  Discontinued Medications   DM-DOXYLAMINE-ACETAMINOPHEN (NYQUIL COLD & FLU PO)    Take by mouth as needed.   MAGIC MOUTHWASH W/LIDOCAINE SOLN    Take 5 mLs by mouth 3 (three) times daily as needed for mouth pain.    Physical Exam:  There were no vitals filed for this visit. There is no height or weight on file to calculate BMI. Wt Readings from Last 3 Encounters:  04/17/19 217 lb (98.4 kg)  03/07/19 223 lb 3.2 oz (101.2 kg)  02/08/19 223 lb 3.2 oz (101.2 kg)     Labs reviewed: Basic Metabolic Panel: Recent Labs    02/17/19 1102  NA 137  K 3.7  CL 103  CO2 25  GLUCOSE 95  BUN 8  CREATININE 0.59  CALCIUM 9.4   Liver Function Tests: Recent Labs    02/17/19 1102  AST 22  ALT 14  BILITOT 0.8  PROT 6.8   No results for input(s): LIPASE, AMYLASE in the last 8760 hours. No results for input(s): AMMONIA in the last 8760 hours. CBC: Recent Labs    02/17/19 1102  WBC 4.5  NEUTROABS 2,691  HGB 13.1  HCT 36.8  MCV 112.2*  PLT 154   Lipid Panel: Recent Labs    02/17/19 1102  CHOL 184  HDL 93  LDLCALC 76  TRIG 71  CHOLHDL 2.0   TSH: No results for input(s): TSH in the last 8760 hours. A1C: Lab Results  Component Value Date   HGBA1C 4.8 07/01/2015     Assessment/Plan 1. Chronic right-sided low back pain with right-sided sciatica Ongoing, continues on oxycodone 5-325 mg every 8-12 hours. MRI pending. Will continue to prescribe medication at this time with further evaluation. He is aware of the risk and side effects of oxycodone-apap. Can also take tylenol 500 mg 1-2 tablets every 8 hours  if needed but MAX tylenol in 24 hours is 3000 mg from all sources so to add in oxycodone-apap dosing.   2. Folic acid deficiency -continues on supplement. Has significant cut back on alcohol.   3. Tobacco abuse Continues with smoking cigarettes, has cut back since not drinking alcohol.   4. ETOH abuse -has significantly reduced alcohol intake. Cessation encouraged.   5. Morbid obesity (HCC) Continue to work on weight reduction with dietary modifications, activity is limited due to chronic back pain.  Next appt: 4 months for routine follow up Laura [name] K. Biag[facility] [phone]   Virtual Visit via Video Note  I connected with Laura [name] on 09/26/19 at  3:45 PM EDT by a video enabled telemedicine application and verified that I am speaking with the correct person using two identifiers.  Location: Patient: home Provider: work   I discussed the limitations of evaluation and management by telemedicine and the availability of in person appointments. The patient expressed understanding and agreed to proceed.    I discussed the assessment and treatment plan with the patient. The patient was provided an opportunity to ask questions and all were answered. The patient agreed with the plan and demonstrated an understanding of the instructions.   The patient was advised to call back or seek an in-person evaluation if the symptoms worsen or if the condition fails to improve as anticipated.  I provided 22 minutes of non-face-to-face time during this encounter.  Carlos American. Dewaine Oats, AGNP Avs printed and mailed.

## 2019-09-25 NOTE — Telephone Encounter (Signed)
[name], loyal are scheduled for a virtual visit with your provider today.    Just as we do with appointments in the office, we must obtain your consent to participate.  Your consent will be active for this visit and any virtual visit you may have with one of our providers in the next 365 days.    If you have a MyChart account, I can also send a copy of this consent to you electronically.  All virtual visits are billed to your insurance company just like a traditional visit in the office.  As this is a virtual visit, video technology does not allow for your provider to perform a traditional examination.  This may limit your provider's ability to fully assess your condition.  If your provider identifies any concerns that need to be evaluated in person or the need to arrange testing such as labs, EKG, etc, we will make arrangements to do so.    Although advances in technology are sophisticated, we cannot ensure that it will always work on either your end or our end.  If the connection with a video visit is poor, we may have to switch to a telephone visit.  With either a video or telephone visit, we are not always able to ensure that we have a secure connection.   I need to obtain your verbal consent now.   Are you willing to proceed with your visit today?   Laura [name] has provided verbal consent on 09/25/2019 for a virtual visit (video or telephone).   Edison Simon [place], [place] 09/25/2019  3:46 PM

## 2019-10-02 ENCOUNTER — Encounter: Payer: Medicare Other | Admitting: Family

## 2019-10-10 ENCOUNTER — Encounter: Payer: Medicare Other | Admitting: Nurse Practitioner

## 2019-10-10 ENCOUNTER — Other Ambulatory Visit: Payer: Self-pay

## 2019-10-10 NOTE — Progress Notes (Signed)
This service is provided via telemedicine  No vital signs collected/recorded due to the encounter was a telemedicine visit.   Location of patient (ex: home, work):  Home  Patient consents to a telephone visit:  Yes, see telephone encounter dated 09/25/2019  Location of the provider (ex: office, home):  [facility]  Name of any referring provider:  N/A  Names of all persons participating in the telemedicine service and their role in the encounter:  Abbey Chatters, Nurse Practitioner, Laura [name], CMA, and patient.   Time spent on call:  5 minutes with medical assistant

## 2019-10-11 NOTE — Progress Notes (Signed)
This encounter was created in error - please disregard.

## 2019-10-12 ENCOUNTER — Encounter: Payer: Self-pay | Admitting: Nurse Practitioner

## 2019-10-12 ENCOUNTER — Ambulatory Visit (INDEPENDENT_AMBULATORY_CARE_PROVIDER_SITE_OTHER): Payer: Medicare Other | Admitting: Nurse Practitioner

## 2019-10-12 ENCOUNTER — Other Ambulatory Visit: Payer: Self-pay

## 2019-10-12 DIAGNOSIS — Z1231 Encounter for screening mammogram for malignant neoplasm of breast: Secondary | ICD-10-CM | POA: Diagnosis not present

## 2019-10-12 DIAGNOSIS — Z0000 Encounter for general adult medical examination without abnormal findings: Secondary | ICD-10-CM | POA: Diagnosis not present

## 2019-10-12 NOTE — Telephone Encounter (Signed)
This encounter was created in error - please disregard.
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
## 2019-10-12 NOTE — Patient Instructions (Signed)
[name] , Thank you for taking time to come for your Medicare Wellness Visit. I appreciate your ongoing commitment to your health goals. Please review the following plan we discussed and let me know if I can assist you in the future.   Screening recommendations/referrals: Colonoscopy/colorectal screening- up to date- DUE 2022 Mammogram DUE to call and schedule appt Recommended yearly ophthalmology/optometry visit for glaucoma screening and checkup Recommended yearly dental visit for hygiene and checkup  Vaccinations: Influenza vaccine DUE SEPT 2021 Pneumococcal vaccine up to date Tdap vaccine up to date Shingles vaccine RECOMMENDED- to go to local pharmacy and ask for vaccine     Advanced directives: recommended to complete and bring back to office so we can place on file  Conditions/risks identified: smoker, obesity, fall risk  Next appointment: 1 year  Preventive Care 40-64 Years, Female Preventive care refers to lifestyle choices and visits with your health care provider that can promote health and wellness. What does preventive care include?  A yearly physical exam. This is also called an annual well check.  Dental exams once or twice a year.  Routine eye exams. Ask your health care provider how often you should have your eyes checked.  Personal lifestyle choices, including:  Daily care of your teeth and gums.  Regular physical activity.  Eating a healthy diet.  Avoiding tobacco and drug use.  Limiting alcohol use.  Practicing safe sex.  Taking low-dose aspirin daily starting at age 33.  Taking vitamin and mineral supplements as recommended by your health care provider. What happens during an annual well check? The services and screenings done by your health care provider during your annual well check will depend on your age, overall health, lifestyle risk factors, and family history of disease. Counseling  Your health care provider may ask you questions about  your:  Alcohol use.  Tobacco use.  Drug use.  Emotional well-being.  Home and relationship well-being.  Sexual activity.  Eating habits.  Work and work Statistician.  Method of birth control.  Menstrual cycle.  Pregnancy history. Screening  You may have the following tests or measurements:  Height, weight, and BMI.  Blood pressure.  Lipid and cholesterol levels. These may be checked every 5 years, or more frequently if you are over 60 years old.  Skin check.  Lung cancer screening. You may have this screening every year starting at age 67 if you have a 30-pack-year history of smoking and currently smoke or have quit within the past 15 years.  Fecal occult blood test (FOBT) of the stool. You may have this test every year starting at age 30.  Flexible sigmoidoscopy or colonoscopy. You may have a sigmoidoscopy every 5 years or a colonoscopy every 10 years starting at age 69.  Hepatitis C blood test.  Hepatitis B blood test.  Sexually transmitted disease (STD) testing.  Diabetes screening. This is done by checking your blood sugar (glucose) after you have not eaten for a while (fasting). You may have this done every 1-3 years.  Mammogram. This may be done every 1-2 years. Talk to your health care provider about when you should start having regular mammograms. This may depend on whether you have a family history of breast cancer.  BRCA-related cancer screening. This may be done if you have a family history of breast, ovarian, tubal, or peritoneal cancers.  Pelvic exam and Pap test. This may be done every 3 years starting at age 78. Starting at age 67, this may be  done every 5 years if you have a Pap test in combination with an HPV test.  Bone density scan. This is done to screen for osteoporosis. You may have this scan if you are at high risk for osteoporosis. Discuss your test results, treatment options, and if necessary, the need for more tests with your health care  provider. Vaccines  Your health care provider may recommend certain vaccines, such as:  Influenza vaccine. This is recommended every year.  Tetanus, diphtheria, and acellular pertussis (Tdap, Td) vaccine. You may need a Td booster every 10 years.  Zoster vaccine. You may need this after age 5.  Pneumococcal 13-valent conjugate (PCV13) vaccine. You may need this if you have certain conditions and were not previously vaccinated.  Pneumococcal polysaccharide (PPSV23) vaccine. You may need one or two doses if you smoke cigarettes or if you have certain conditions. Talk to your health care provider about which screenings and vaccines you need and how often you need them. This information is not intended to replace advice given to you by your health care provider. Make sure you discuss any questions you have with your health care provider. Document Released: 05/03/2015 Document Revised: 12/25/2015 Document Reviewed: 02/05/2015 Elsevier Interactive Patient Education  2017 [employer] Prevention in the Home Falls can cause injuries. They can happen to people of all ages. There are many things you can do to make your home safe and to help prevent falls. What can I do on the outside of my home?  Regularly fix the edges of walkways and driveways and fix any cracks.  Remove anything that might make you trip as you walk through a door, such as a raised step or threshold.  Trim any bushes or trees on the path to your home.  Use bright outdoor lighting.  Clear any walking paths of anything that might make someone trip, such as rocks or tools.  Regularly check to see if handrails are loose or broken. Make sure that both sides of any steps have handrails.  Any raised decks and porches should have guardrails on the edges.  Have any leaves, snow, or ice cleared regularly.  Use sand or salt on walking paths during winter.  Clean up any spills in your garage right away. This includes  oil or grease spills. What can I do in the bathroom?  Use night lights.  Install grab bars by the toilet and in the tub and shower. Do not use towel bars as grab bars.  Use non-skid mats or decals in the tub or shower.  If you need to sit down in the shower, use a plastic, non-slip stool.  Keep the floor dry. Clean up any water that spills on the floor as soon as it happens.  Remove soap buildup in the tub or shower regularly.  Attach bath mats securely with double-sided non-slip rug tape.  Do not have throw rugs and other things on the floor that can make you trip. What can I do in the bedroom?  Use night lights.  Make sure that you have a light by your bed that is easy to reach.  Do not use any sheets or blankets that are too big for your bed. They should not hang down onto the floor.  Have a firm chair that has side arms. You can use this for support while you get dressed.  Do not have throw rugs and other things on the floor that can make you trip. What  can I do in the kitchen?  Clean up any spills right away.  Avoid walking on wet floors.  Keep items that you use a lot in easy-to-reach places.  If you need to reach something above you, use a strong step stool that has a grab bar.  Keep electrical cords out of the way.  Do not use floor polish or wax that makes floors slippery. If you must use wax, use non-skid floor wax.  Do not have throw rugs and other things on the floor that can make you trip. What can I do with my stairs?  Do not leave any items on the stairs.  Make sure that there are handrails on both sides of the stairs and use them. Fix handrails that are broken or loose. Make sure that handrails are as long as the stairways.  Check any carpeting to make sure that it is firmly attached to the stairs. Fix any carpet that is loose or worn.  Avoid having throw rugs at the top or bottom of the stairs. If you do have throw rugs, attach them to the floor  with carpet tape.  Make sure that you have a light switch at the top of the stairs and the bottom of the stairs. If you do not have them, ask someone to add them for you. What else can I do to help prevent falls?  Wear shoes that:  Do not have high heels.  Have rubber bottoms.  Are comfortable and fit you well.  Are closed at the toe. Do not wear sandals.  If you use a stepladder:  Make sure that it is fully opened. Do not climb a closed stepladder.  Make sure that both sides of the stepladder are locked into place.  Ask someone to hold it for you, if possible.  Clearly mark and make sure that you can see:  Any grab bars or handrails.  First and last steps.  Where the edge of each step is.  Use tools that help you move around (mobility aids) if they are needed. These include:  Canes.  Walkers.  Scooters.  Crutches.  Turn on the lights when you go into a dark area. Replace any light bulbs as soon as they burn out.  Set up your furniture so you have a clear path. Avoid moving your furniture around.  If any of your floors are uneven, fix them.  If there are any pets around you, be aware of where they are.  Review your medicines with your doctor. Some medicines can make you feel dizzy. This can increase your chance of falling. Ask your doctor what other things that you can do to help prevent falls. This information is not intended to replace advice given to you by your health care provider. Make sure you discuss any questions you have with your health care provider. Document Released: 01/31/2009 Document Revised: 09/12/2015 Document Reviewed: 05/11/2014 Elsevier Interactive Patient Education  2017 [employer].

## 2019-10-12 NOTE — Progress Notes (Signed)
This service is provided via telemedicine  No vital signs collected/recorded due to the encounter was a telemedicine visit.   Location of patient (ex: home, work):  Home  Patient consents to a telephone visit:  Yes, see encounter dated 09/25/2019  Location of the provider (ex: office, home):  [facility]  Name of any referring provider:  N/A  Names of all persons participating in the telemedicine service and their role in the encounter:  Abbey Chatters, Nurse Practitioner, Elveria Royals, CMA, and patient.   Time spent on call:  8 minutes spent with medical assistant

## 2019-10-19 ENCOUNTER — Other Ambulatory Visit: Payer: Self-pay

## 2019-10-19 DIAGNOSIS — G8929 Other chronic pain: Secondary | ICD-10-CM

## 2019-10-19 NOTE — Telephone Encounter (Signed)
Patient called to check to see what day we were off for July 4th and wanted to be sure a refill was sent for Percocet before 10/23/19.  Order was pended and sent to provider for approval.

## 2019-10-20 MED ORDER — OXYCODONE-ACETAMINOPHEN 5-325 MG PO TABS: ORAL_TABLET | ORAL | 0 refills | Status: DC

## 2019-10-21 ENCOUNTER — Other Ambulatory Visit: Payer: Self-pay

## 2019-10-21 ENCOUNTER — Ambulatory Visit
Admission: RE | Admit: 2019-10-21 | Discharge: 2019-10-21 | Disposition: A | Discharge status: Home / Self Care | Payer: Medicare Other | Source: Ambulatory Visit | Attending: Neurosurgery | Admitting: Neurosurgery

## 2019-10-21 DIAGNOSIS — M48061 Spinal stenosis, lumbar region without neurogenic claudication: Secondary | ICD-10-CM | POA: Diagnosis not present

## 2019-10-21 DIAGNOSIS — M48062 Spinal stenosis, lumbar region with neurogenic claudication: Secondary | ICD-10-CM

## 2019-10-21 IMAGING — MR MR LUMBAR SPINE W/O CM
4 of 5 series · 18 of 48 positions shown · non-contrast
Comparison: Prior MRI from [DATE].

CLINICAL DATA: Initial evaluation for neurogenic claudication.

EXAM:
MRI LUMBAR SPINE WITHOUT CONTRAST
TECHNIQUE: Multiplanar, multisequence MR imaging of the lumbar spine was
performed. No intravenous contrast was administered.

[Series 5: T2 · sagittal · 4.0mm · 0.73mm/px · 6 of 15 slices shown (1 of 2)]
[im 1/15]
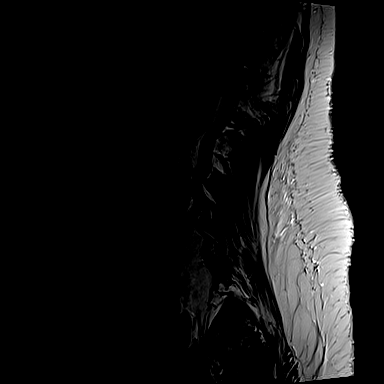
[im 3/15]
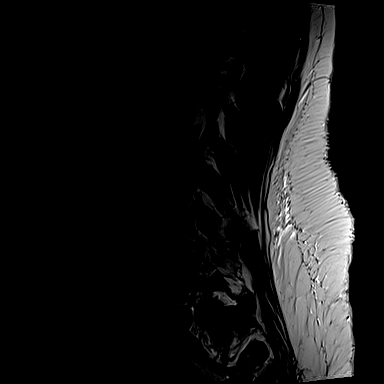
[im 6/15]
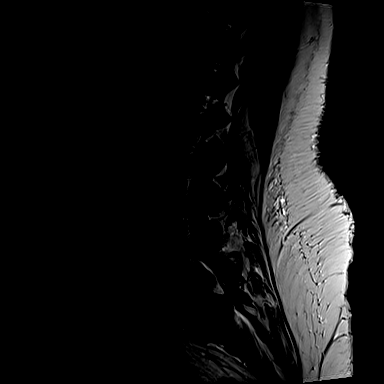
[im 9/15]
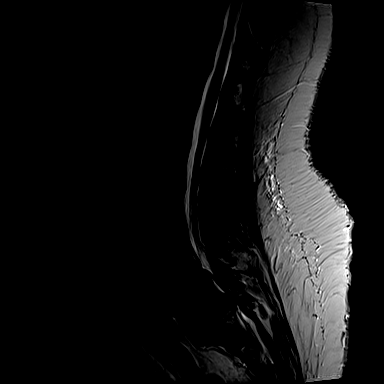
[im 12/15]
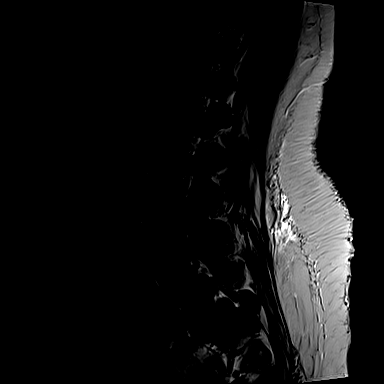
[im 15/15]
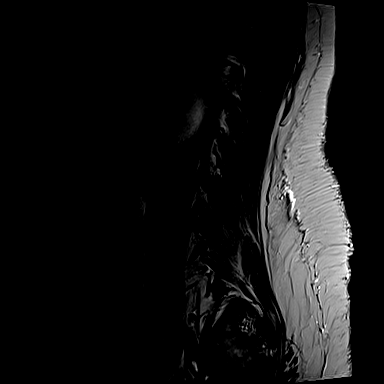

[Series 6: T1 · sagittal · 4.0mm · 0.73mm/px · 3 of 15 slices shown (1 of 2)]
[im 3/15]
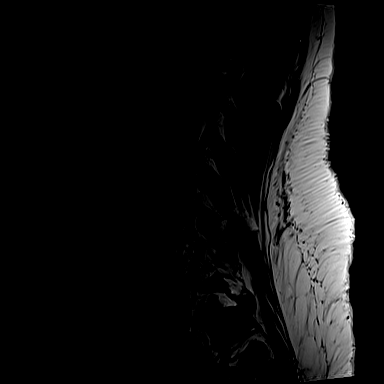
[im 9/15]
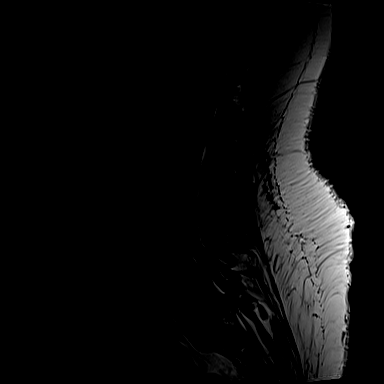
[im 15/15]
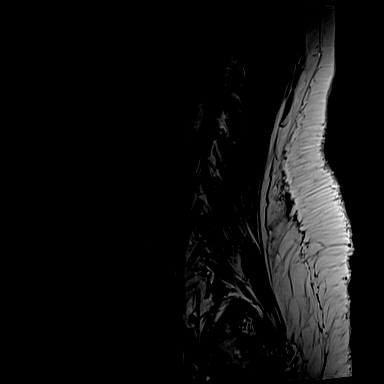

[Series 10: T1 · axial · 4.0mm · 0.28mm/px · z∈[-127,+40]mm · 3 of 40 slices shown (2 of 2)]
[im 6/40]
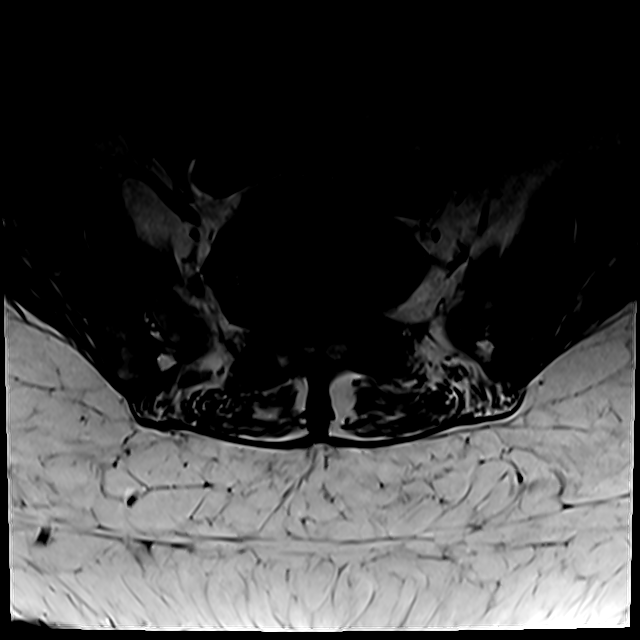
[im 20/40]
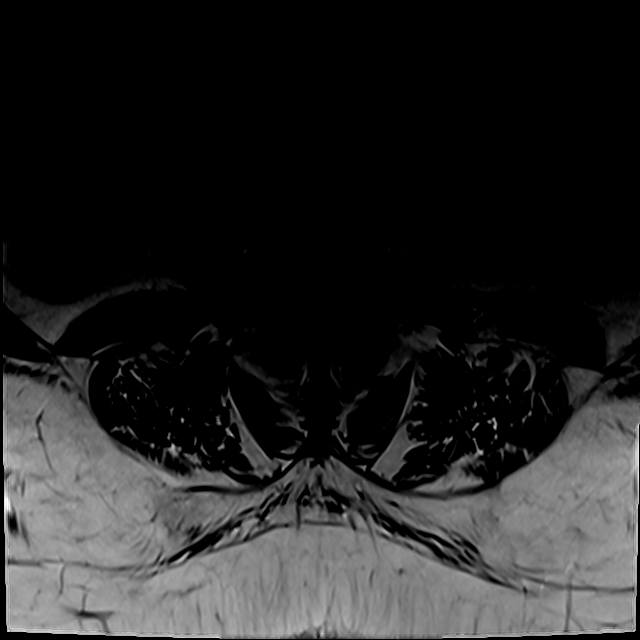
[im 34/40]
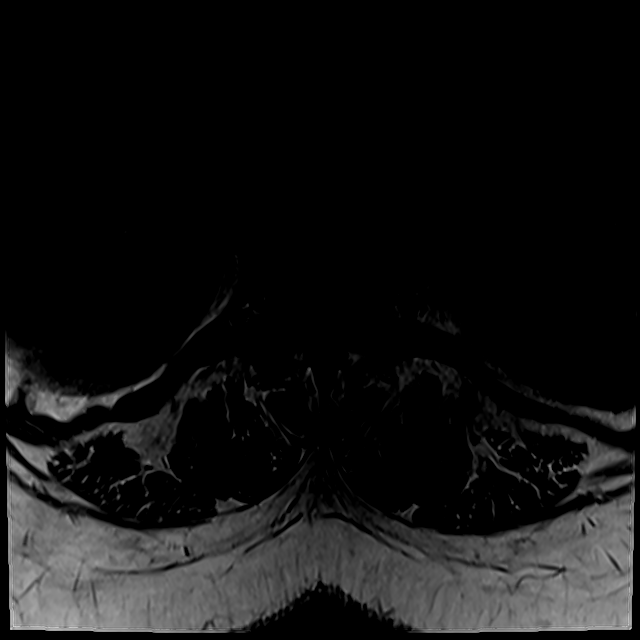

[Series 13: T2 · axial · 4.0mm · 0.28mm/px · z∈[-151,+40]mm · 6 of 40 slices shown (2 of 2)]
[im 1/40]
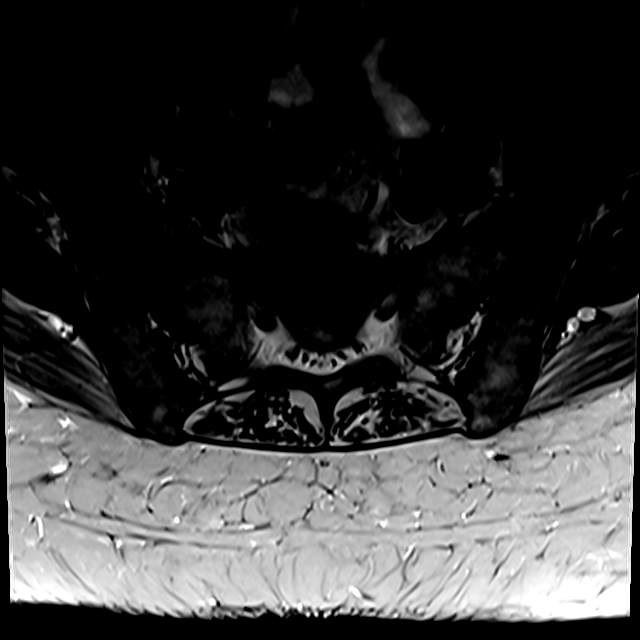
[im 6/40]
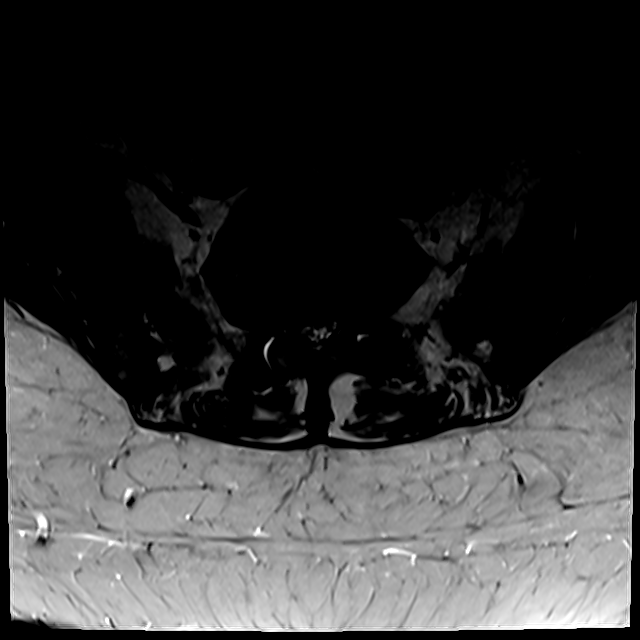
[im 12/40]
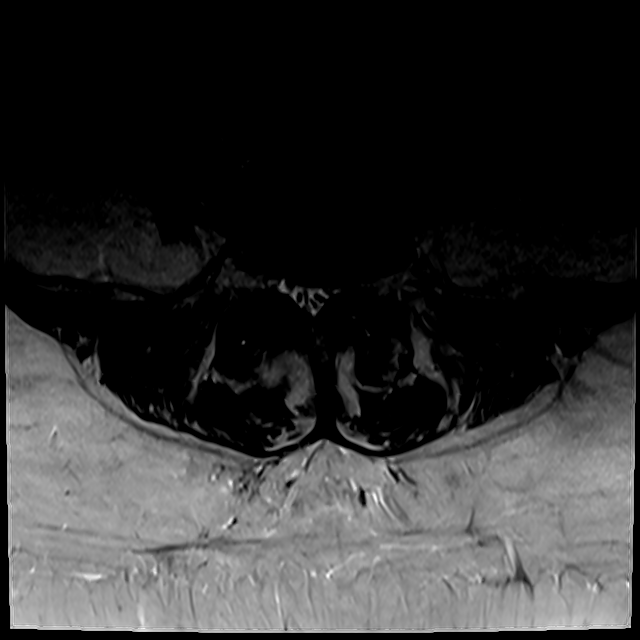
[im 17/40]
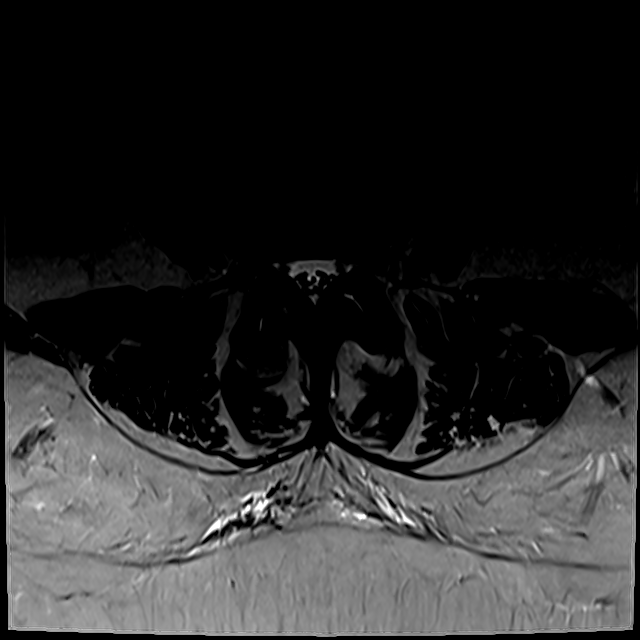
[im 20/40]
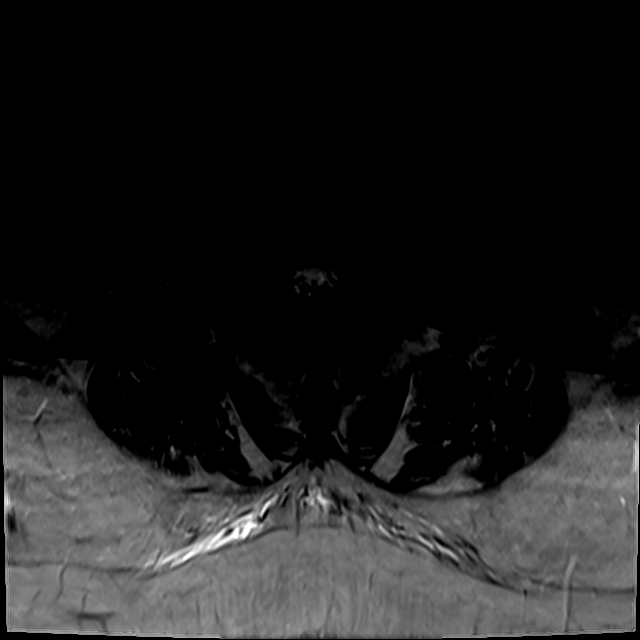
[im 34/40]
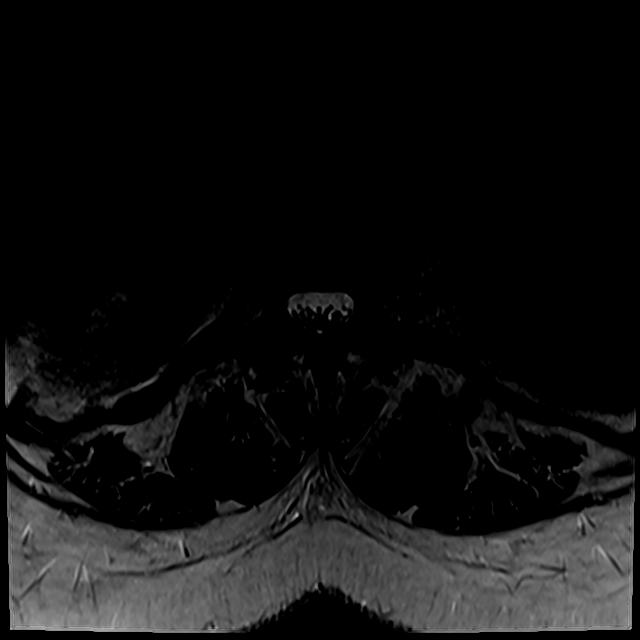

[18 of 48 positions shown; findings below may reference images not displayed]

FINDINGS: Segmentation: Standard. Lowest well-formed disc space labeled the
L5-S1 level.

Alignment: 4 mm anterolisthesis of L4 on L5, chronic and facet
mediated. Alignment otherwise normal with preservation of the normal
lumbar lordosis.

Vertebrae: Vertebral body height maintained without evidence for
acute or chronic fracture. Bone marrow signal intensity diffusely
heterogeneous. Subcentimeter benign hemangioma noted within the T10
vertebral body. No worrisome osseous lesions. Mild reactive endplate
changes present about the L5-S1 interspace. No abnormal marrow
edema.

Conus medullaris and cauda equina: Conus extends to the L1 level.
Conus and cauda equina appear normal.

Paraspinal and other soft tissues: Paraspinous soft tissues within
normal limits. Visualized visceral structures grossly within normal
limits.

Disc levels:

L1-2: Normal interspace. Moderate bilateral facet hypertrophy. No
stenosis or impingement.

L2-3: Mild diffuse disc bulge with disc desiccation. Superimposed
left extraforaminal disc protrusion closely approximates the exiting
left L2 nerve root (series 10, image 17). Moderate to advanced
bilateral facet hypertrophy. Associated trace bilateral joint
effusions. Associated mild narrowing of the left lateral recess.
Central canal remains patent. No significant foraminal stenosis.

L3-4: Negative interspace. Moderate to advanced bilateral facet
hypertrophy. Trace bilateral joint effusions. Resultant borderline
mild spinal stenosis. Foramina remain patent.

L4-5: 4 mm anterolisthesis. Disc desiccation with mild diffuse disc
bulge, asymmetric to the left. Severe bilateral facet degeneration.
Associated trace bilateral joint effusions. Resultant mild canal
with moderate bilateral lateral recess stenosis, left greater than
right. Mild left L4 foraminal narrowing. Right neural foramen widely
patent.

L5-S1: Diffuse disc bulge with disc desiccation and intervertebral
disc space narrowing. Associated reactive endplate changes.
Superimposed broad-based right foraminal to extraforaminal disc
protrusion (series 5, image 6). Moderate bilateral facet hypertrophy
with associated small bilateral joint effusions. Resultant moderate
to severe canal with bilateral subarticular stenosis. Mild right L5
foraminal narrowing.
IMPRESSION: 1. Multifactorial degenerative changes at L5-S1 with resultant
moderate to severe spinal stenosis.
2. Left eccentric disc bulge with severe facet degeneration at L4-5
with resultant mild canal and moderate left greater than right
lateral recess stenosis.
3. Left extraforaminal disc protrusion at L2-3, closely
approximating and potentially irritating the exiting left L2 nerve
root.

## 2019-10-24 DIAGNOSIS — M48062 Spinal stenosis, lumbar region with neurogenic claudication: Secondary | ICD-10-CM | POA: Diagnosis not present

## 2019-10-25 ENCOUNTER — Encounter: Payer: Self-pay | Admitting: Nurse Practitioner

## 2019-10-25 ENCOUNTER — Other Ambulatory Visit: Payer: Self-pay

## 2019-10-25 ENCOUNTER — Ambulatory Visit (INDEPENDENT_AMBULATORY_CARE_PROVIDER_SITE_OTHER): Payer: Medicare Other | Admitting: Nurse Practitioner

## 2019-10-25 VITALS — Ht 65.0 in | Wt 217.0 lb

## 2019-10-25 DIAGNOSIS — M5441 Lumbago with sciatica, right side: Secondary | ICD-10-CM | POA: Diagnosis not present

## 2019-10-25 DIAGNOSIS — G8929 Other chronic pain: Secondary | ICD-10-CM | POA: Diagnosis not present

## 2019-10-25 NOTE — Progress Notes (Signed)
This service is provided via telemedicine  No vital signs collected/recorded due to the encounter was a telemedicine visit.   Location of patient (ex: home, work):  Home  Patient consents to a telephone visit:  Yes  Location of the provider (ex: office, home):  [facility] office  Name of any referring provider:  Abbey Chatters, NP  Names of all persons participating in the telemedicine service and their role in the encounter:  Abbey Chatters, NP, Karin Lieu, CMA; Weston Anna  Time spent on call:  8 minutes  CMA time only.\      Careteam: Patient Care Team: Sharon Seller, NP as PCP - General (Geriatric Medicine)  PLACE OF SERVICE:  [facility]  Advanced Directive information    Allergies  Allergen Reactions   Aspirin Hives   Penicillins Hives    Chief Complaint  Patient presents with   Acute Visit    Wants to discuss MD appointment from 10/24/19     HPI: Patient is a 59 y.o. female to discuss visit with neurosurgery. Reports "big ball of nerves wrapped around causing pain" neurosurgereon said he could stop the pain going down the leg but not with the back.  Reports scared due to how close it is to the spine.  Does not know what to do. Neurosurgery reports pain will continue to get worse. Left leg in a lot of pain now.  Hydrocodone-apap eases the pain but does not stop it.  Gabapentin did not help the pain.  lyrica did not help the pain and caused nightmares Cymbalta helped for a short course but then stopped working.  Review of Systems:  Review of Systems  Constitutional: Negative for chills and fever.  Gastrointestinal: Negative for constipation and diarrhea.  Genitourinary: Negative for dysuria and urgency.  Musculoskeletal: Positive for back pain and myalgias.  Neurological: Positive for tingling and sensory change.    Past Medical History:  Diagnosis Date   Acute bronchitis    Cervical high risk HPV (human papillomavirus) test positive 12/2017    Negative subtype 16, 18/45   Chronic back pain    Complication of anesthesia    "didn't wake up"   Cough    Insomnia, unspecified    Other malaise and fatigue    Other specified diseases of blood and blood-forming organs(289.89)    Tobacco use disorder    Past Surgical History:  Procedure Laterality Date   ESOPHAGOGASTRODUODENOSCOPY N/A 03/17/2013   Procedure: ESOPHAGOGASTRODUODENOSCOPY (EGD);  Surgeon: Meryl Dare, MD;  Location: [facility] ENDOSCOPY;  Service: Endoscopy;  Laterality: N/A;   FRACTURE SURGERY     TONSILLECTOMY     Social History:   reports that she has been smoking cigarettes. She has a 6.00 pack-year smoking history. She has never used smokeless tobacco. She reports previous alcohol use. She reports that she does not use drugs.  Family History  Problem Relation Age of Onset   Arthritis Mother    Hypertension Sister    Hypertension Brother    Hypertension Sister    Hypertension Sister    Hypertension Sister     Medications: Patient's Medications  New Prescriptions   No medications on file  Previous Medications   ALBUTEROL (VENTOLIN HFA) 108 (90 BASE) MCG/ACT INHALER    INHALE 2 PUFFS INTO THE LUNGS EVERY 6 HOURS AS NEEDED FOR WHEEZING OR SHORTNESS OF BREATH   FOLIC ACID (FOLVITE) 1 MG TABLET    Take 1 mg by mouth daily.   LOSARTAN (COZAAR) 25 MG TABLET  TAKE 1 TABLET(25 MG) BY MOUTH DAILY   OXYCODONE-ACETAMINOPHEN (PERCOCET/ROXICET) 5-325 MG TABLET    Take one tablet by mouth every 8-12 hours as needed for pain.  Modified Medications   No medications on file  Discontinued Medications   No medications on file    Physical Exam:  Vitals:   10/25/19 0834  Weight: 217 lb (98.4 kg)  Height: 5\' 5"  (1.651 m)   Body mass index is 36.11 kg/m. Wt Readings from Last 3 Encounters:  10/25/19 217 lb (98.4 kg)  04/17/19 217 lb (98.4 kg)  03/07/19 223 lb 3.2 oz (101.2 kg)     Labs reviewed: Basic Metabolic Panel: Recent Labs     02/17/19 1102  NA 137  K 3.7  CL 103  CO2 25  GLUCOSE 95  BUN 8  CREATININE 0.59  CALCIUM 9.4   Liver Function Tests: Recent Labs    02/17/19 1102  AST 22  ALT 14  BILITOT 0.8  PROT 6.8   No results for input(s): LIPASE, AMYLASE in the last 8760 hours. No results for input(s): AMMONIA in the last 8760 hours. CBC: Recent Labs    02/17/19 1102  WBC 4.5  NEUTROABS 2,691  HGB 13.1  HCT 36.8  MCV 112.2*  PLT 154   Lipid Panel: Recent Labs    02/17/19 1102  CHOL 184  HDL 93  LDLCALC 76  TRIG 71  CHOLHDL 2.0   TSH: No results for input(s): TSH in the last 8760 hours. A1C: Lab Results  Component Value Date   HGBA1C 4.8 07/01/2015     Assessment/Plan 1. Chronic right-sided low back pain with right-sided sciatica -neurosurgery offering surgery, but pt unsure if want to persue this route due to risk of complication.  Continues on hydrocodone- apap which helps with back pain but not with sciatica. Cymbalta, lyrica, and gabapentin not effective.   Next appt: 3 months.  07/03/2015. Janene Harvey  [facility] [phone]   Virtual Visit via Video Note  I connected with [phone] on 10/25/19 at  8:30 AM EDT by a video enabled telemedicine application and verified that I am speaking with the correct person using two identifiers.  Location: Patient: home Provider: office   I discussed the limitations of evaluation and management by telemedicine and the availability of in person appointments. The patient expressed understanding and agreed to proceed.    I discussed the assessment and treatment plan with the patient. The patient was provided an opportunity to ask questions and all were answered. The patient agreed with the plan and demonstrated an understanding of the instructions.   The patient was advised to call back or seek an in-person evaluation if the symptoms worsen or if the condition fails to improve as anticipated.  I  provided  15 minutes of non-face-to-face time during this encounter.  12/26/19. Janene Harvey, AGNP Avs printed and mailed.

## 2019-11-06 ENCOUNTER — Encounter: Payer: Self-pay | Admitting: Nurse Practitioner

## 2019-11-06 ENCOUNTER — Telehealth (INDEPENDENT_AMBULATORY_CARE_PROVIDER_SITE_OTHER): Payer: Medicare Other | Admitting: Nurse Practitioner

## 2019-11-06 ENCOUNTER — Other Ambulatory Visit: Payer: Self-pay

## 2019-11-06 DIAGNOSIS — G8929 Other chronic pain: Secondary | ICD-10-CM | POA: Diagnosis not present

## 2019-11-06 DIAGNOSIS — L853 Xerosis cutis: Secondary | ICD-10-CM

## 2019-11-06 DIAGNOSIS — M5441 Lumbago with sciatica, right side: Secondary | ICD-10-CM

## 2019-11-06 NOTE — Progress Notes (Signed)
This service is provided via telemedicine  No vital signs collected/recorded due to the encounter was a telemedicine visit.   Location of Laura [name] (ex: home, work):  Home  Laura [name] consents to a telephone visit:  Yes, see telephone encounter dated 09/25/2019 with annual consent   Location of the provider (ex: office, home): [facility] and Adult Medicine, Office   Name of any referring provider:  N/A  Names of all persons participating in the telemedicine service and their role in the encounter: Laura [name], Laura Chatters, NP, and Laura [name]   Time spent on call:  6 min with medical assistant       Careteam: Laura [name] Care Team: Laura Seller, NP as PCP - General (Geriatric Medicine)  PLACE OF SERVICE:  [facility]  Advanced Directive information    Allergies  Allergen Reactions  . Aspirin Hives  . Lyrica [Pregabalin]     Nightmares, did not help neuropathy  . Penicillins Hives    Chief Complaint  Laura [name] presents with  . Acute Visit    Visit to discuss surgery and ongoing skin issues per Laura [name] request. Video visit      HPI: Laura [name] is a 59 y.o. female to discuss surgery.  Reports that he has decided to go fwd with surgery. Back is always hot, numbness with needles in legs. Unable to tolerate gabapentin, lyrica and Cymbalta.    Reports skin is very dry, no matter what lotion is apply still is dry. Drinking plenty of water.  Using cerave body cream Using Mositurizing body wash dryness rse on legs, feet and arms.   Review of Systems:  Review of Systems  Constitutional: Negative for chills and fever.  Cardiovascular: Negative for chest pain and palpitations.  Musculoskeletal: Positive for back pain and myalgias.  Skin: Negative for itching and rash.       Dry, flaky skin  Neurological: Positive for tingling and sensory change.    Past Medical History:  Diagnosis Date  . Acute bronchitis   . Cervical high risk HPV (human papillomavirus) test  positive 12/2017   Negative subtype 16, 18/45  . Chronic back pain   . Complication of anesthesia    "didn't wake up"  . Cough   . Insomnia, unspecified   . Other malaise and fatigue   . Other specified diseases of blood and blood-forming organs(289.89)   . Tobacco use disorder    Past Surgical History:  Procedure Laterality Date  . ESOPHAGOGASTRODUODENOSCOPY N/A 03/17/2013   Procedure: ESOPHAGOGASTRODUODENOSCOPY (EGD);  Surgeon: Laura Dare, MD;  Location: [facility] ENDOSCOPY;  Service: Endoscopy;  Laterality: N/A;  . FRACTURE SURGERY    . TONSILLECTOMY     Social History:   reports that she has been smoking cigarettes. She has a 6.00 pack-year smoking history. She has never used smokeless tobacco. She reports previous alcohol use. She reports that she does not use drugs.  Family History  Problem Relation Age of Onset  . Arthritis Mother   . Hypertension Sister   . Hypertension Brother   . Hypertension Sister   . Hypertension Sister   . Hypertension Sister     Medications: Laura [name]'s Medications  New Prescriptions   No medications on file  Previous Medications   ALBUTEROL (VENTOLIN HFA) 108 (90 BASE) MCG/ACT INHALER    INHALE 2 PUFFS INTO THE LUNGS EVERY 6 HOURS AS NEEDED FOR WHEEZING OR SHORTNESS OF BREATH   DIPHENHYDRAMINE (BENADRYL ALLERGY) 25 MG TABLET    Take 25 mg by mouth  as needed.   FOLIC ACID (FOLVITE) 1 MG TABLET    Take 1 mg by mouth daily.   LOSARTAN (COZAAR) 25 MG TABLET    TAKE 1 TABLET(25 MG) BY MOUTH DAILY   OXYCODONE-ACETAMINOPHEN (PERCOCET/ROXICET) 5-325 MG TABLET    Take one tablet by mouth every 8-12 hours as needed for pain.  Modified Medications   No medications on file  Discontinued Medications   No medications on file    Physical Exam:  There were no vitals filed for this visit. There is no height or weight on file to calculate BMI. Wt Readings from Last 3 Encounters:  10/25/19 217 lb (98.4 kg)  04/17/19 217 lb (98.4 kg)  03/07/19 223 lb 3.2  oz (101.2 kg)      Labs reviewed: Basic Metabolic Panel: Recent Labs    02/17/19 1102  NA 137  K 3.7  CL 103  CO2 25  GLUCOSE 95  BUN 8  CREATININE 0.59  CALCIUM 9.4   Liver Function Tests: Recent Labs    02/17/19 1102  AST 22  ALT 14  BILITOT 0.8  PROT 6.8   No results for input(s): LIPASE, AMYLASE in the last 8760 hours. No results for input(s): AMMONIA in the last 8760 hours. CBC: Recent Labs    02/17/19 1102  WBC 4.5  NEUTROABS 2,691  HGB 13.1  HCT 36.8  MCV 112.2*  PLT 154   Lipid Panel: Recent Labs    02/17/19 1102  CHOL 184  HDL 93  LDLCALC 76  TRIG 71  CHOLHDL 2.0   TSH: No results for input(s): TSH in the last 8760 hours. A1C: Lab Results  Component Value Date   HGBA1C 4.8 07/01/2015     Assessment/Plan 1. Chronic right-sided low back pain with right-sided sciatica Plans to go ahead with procedure of lumbar spine through neurosurgery due to severity of pain.   2. Dry skin -encouraged non fragrant detergent, body wash, soap and lotions.  -continue with cerave cream daily -continue with proper hydration.   Next appt: 01/26/2020, sooner if needed Laura [name]. Laura [name]  [facility] & Adult Medicine [phone]   Virtual Visit via Video Note  I connected with Laura [name] on 11/06/19 at 10:00 AM EDT by a video enabled telemedicine application and verified that I am speaking with the correct person using two identifiers.  Location: Laura [name]: home Provider: office   I discussed the limitations of evaluation and management by telemedicine and the availability of in person appointments. The Laura [name] expressed understanding and agreed to proceed.    I discussed the assessment and treatment plan with the Laura [name]. The Laura [name] was provided an opportunity to ask questions and all were answered. The Laura [name] agreed with the plan and demonstrated an understanding of the instructions.   The Laura [name] was advised to call back or seek  an in-person evaluation if the symptoms worsen or if the condition fails to improve as anticipated.  I provided 11 minutes of non-face-to-face time during this encounter.  Laura [name]. Janyth Contes, AGNP Avs printed and mailed.

## 2019-11-21 ENCOUNTER — Other Ambulatory Visit: Payer: Self-pay | Admitting: *Deleted

## 2019-11-21 DIAGNOSIS — G8929 Other chronic pain: Secondary | ICD-10-CM

## 2019-11-21 MED ORDER — OXYCODONE-ACETAMINOPHEN 5-325 MG PO TABS: ORAL_TABLET | ORAL | 0 refills | Status: DC

## 2019-11-21 NOTE — Telephone Encounter (Signed)
Patient requested refill Contract on file Epic LR: 10/20/2019 Pended Rx and sent to [facility] for approval.

## 2019-11-23 ENCOUNTER — Ambulatory Visit
Admission: RE | Admit: 2019-11-23 | Discharge: 2019-11-23 | Disposition: A | Discharge status: Home / Self Care | Payer: Medicare Other | Source: Ambulatory Visit | Attending: Nurse Practitioner | Admitting: Nurse Practitioner

## 2019-11-23 ENCOUNTER — Other Ambulatory Visit: Payer: Self-pay

## 2019-11-23 DIAGNOSIS — Z1231 Encounter for screening mammogram for malignant neoplasm of breast: Secondary | ICD-10-CM

## 2019-11-23 IMAGING — MG DIGITAL SCREENING BILAT W/ CAD
5 series · 5 of 5 positions shown · non-contrast
Comparison: None.

ACR Breast Density Category a: The breast tissue is almost entirely
fatty.

CLINICAL DATA: Screening.

EXAM:
DIGITAL SCREENING BILATERAL MAMMOGRAM WITH CAD

[R CC]
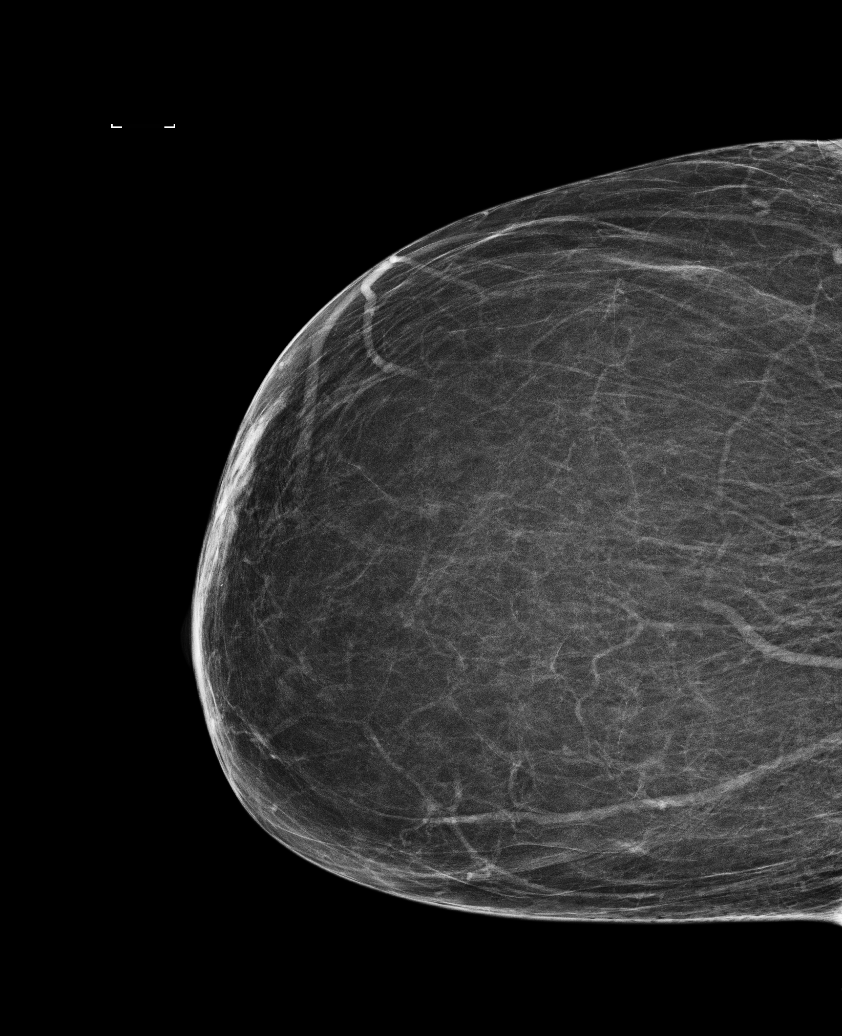

[L CC (1 of 2)]
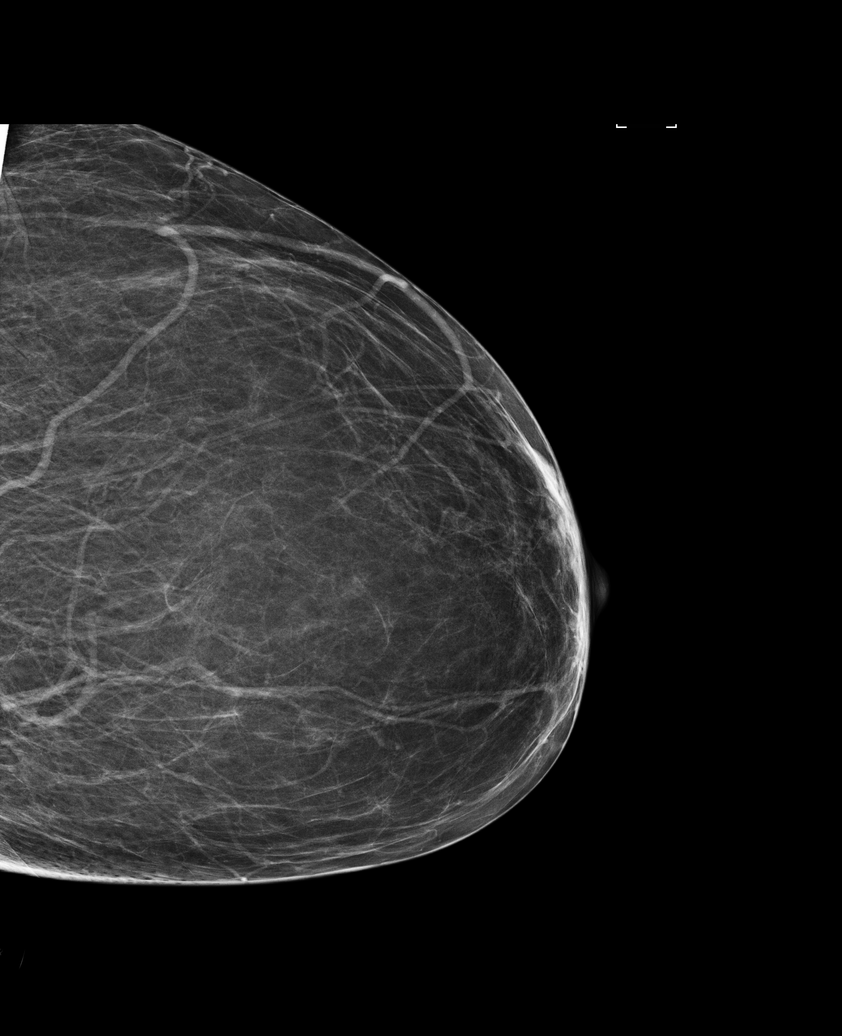

[L MLO]
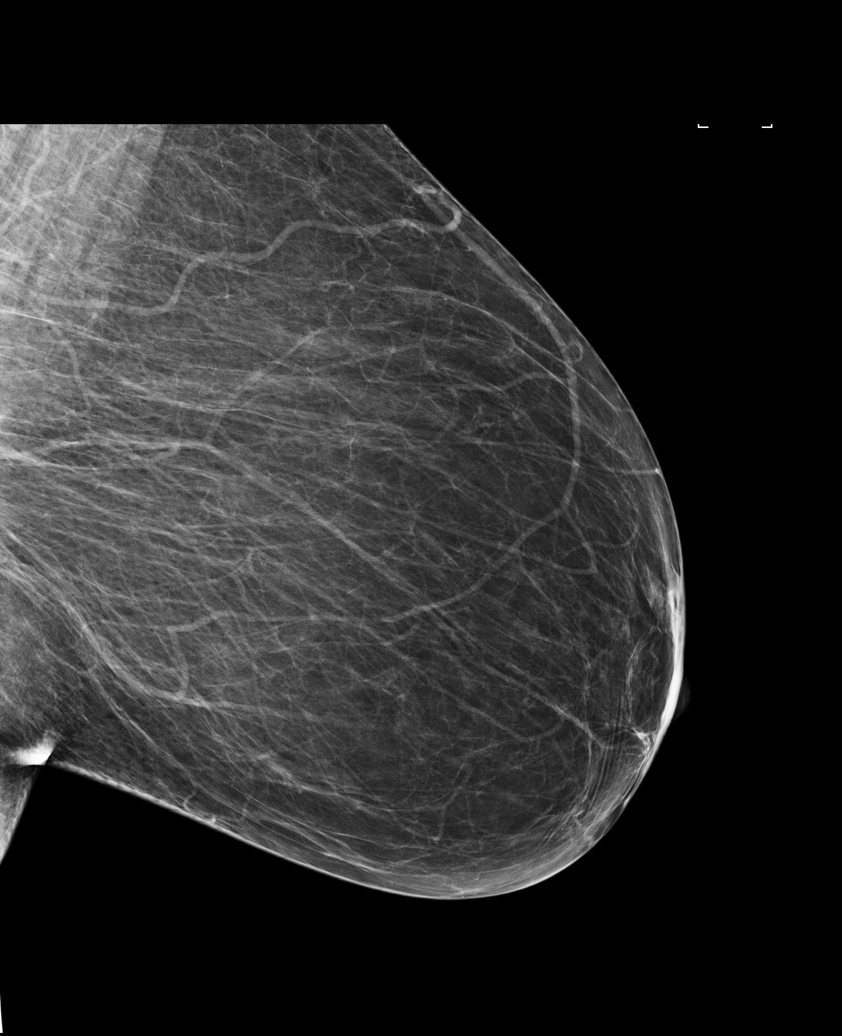

[L CC (2 of 2)]
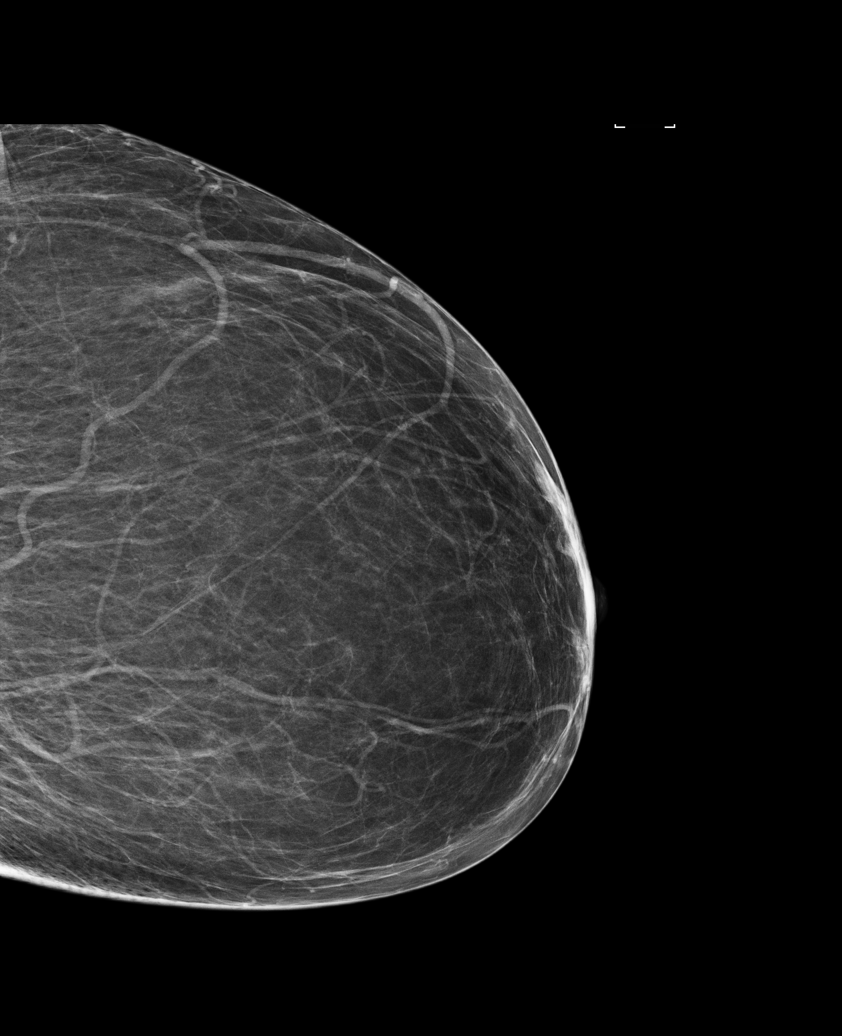

[R MLO]
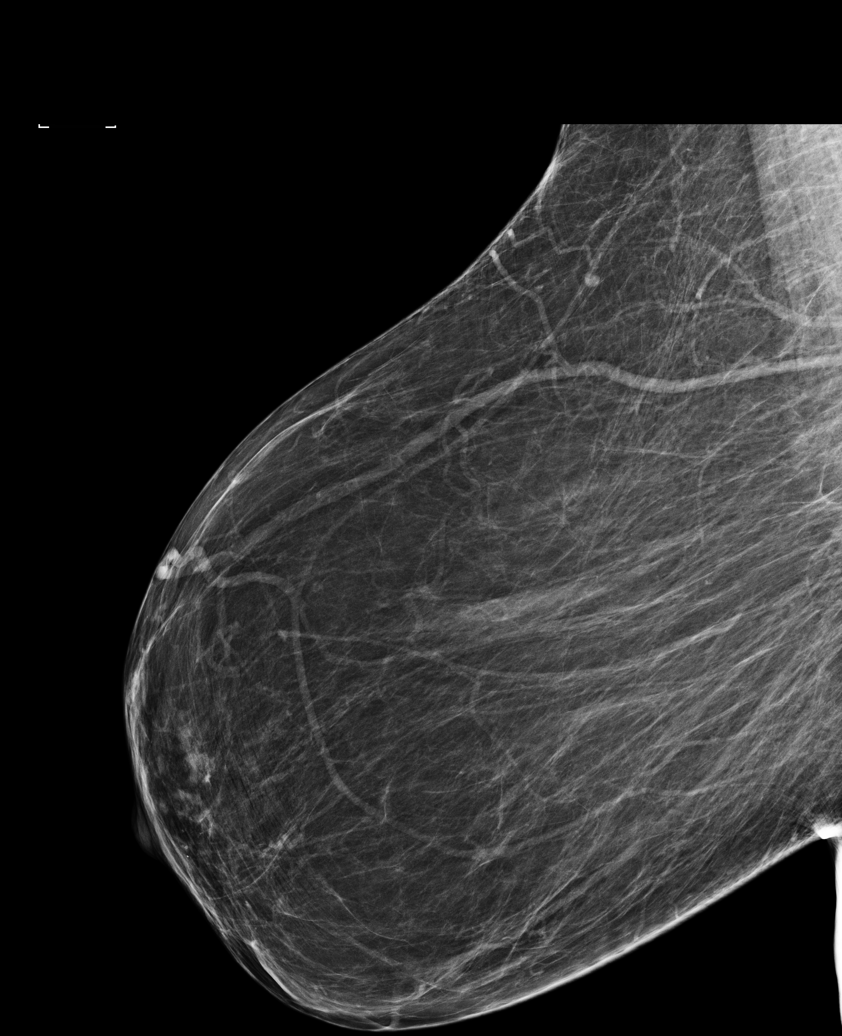

[5 of 5 positions shown; findings below may reference images not displayed]

FINDINGS: There are no findings suspicious for malignancy. Images were
processed with CAD.
IMPRESSION: No mammographic evidence of malignancy. A result letter of this
screening mammogram will be mailed directly to the patient.

RECOMMENDATION:
Screening mammogram in one year. (Code:[IA])

BI-RADS CATEGORY  1: Negative.

## 2019-11-30 ENCOUNTER — Other Ambulatory Visit: Payer: Self-pay

## 2019-11-30 ENCOUNTER — Telehealth: Payer: Self-pay

## 2019-11-30 ENCOUNTER — Telehealth (INDEPENDENT_AMBULATORY_CARE_PROVIDER_SITE_OTHER): Payer: Medicare Other | Admitting: Nurse Practitioner

## 2019-11-30 DIAGNOSIS — M5441 Lumbago with sciatica, right side: Secondary | ICD-10-CM

## 2019-11-30 DIAGNOSIS — G8929 Other chronic pain: Secondary | ICD-10-CM | POA: Diagnosis not present

## 2019-11-30 DIAGNOSIS — Z1231 Encounter for screening mammogram for malignant neoplasm of breast: Secondary | ICD-10-CM

## 2019-11-30 NOTE — Telephone Encounter (Deleted)
Ms. teniola, tseng are scheduled for a virtual visit with your provider today.    Just as we do with appointments in the office, we must obtain your consent to participate.  Your consent will be active for this visit and any virtual visit you may have with one of our providers in the next 365 days.    If you have a MyChart account, I can also send a copy of this consent to you electronically.  All virtual visits are billed to your insurance company just like a traditional visit in the office.  As this is a virtual visit, video technology does not allow for your provider to perform a traditional examination.  This may limit your provider's ability to fully assess your condition.  If your provider identifies any concerns that need to be evaluated in person or the need to arrange testing such as labs, EKG, etc, we will make arrangements to do so.    Although advances in technology are sophisticated, we cannot ensure that it will always work on either your end or our end.  If the connection with a video visit is poor, we may have to switch to a telephone visit.  With either a video or telephone visit, we are not always able to ensure that we have a secure connection.   I need to obtain your verbal consent now.   Are you willing to proceed with your visit today?   Analynn Daum has provided verbal consent on 11/30/2019 for a virtual visit (video or telephone).   Elveria Royals, [facility] 11/30/2019  10:24 AM

## 2019-11-30 NOTE — Addendum Note (Signed)
Addended by: Elveria Royals on: 11/30/2019 10:28 AM   Modules accepted: Level of Service, SmartSet

## 2019-11-30 NOTE — Telephone Encounter (Addendum)
A user error has taken place This encounter was created in error - please disregard. 
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
## 2019-11-30 NOTE — Telephone Encounter (Signed)
This encounter was created in error - please disregard.

## 2019-11-30 NOTE — Addendum Note (Signed)
Addended by: Elveria Royals on: 11/30/2019 10:30 AM   Modules accepted: Level of Service, SmartSet

## 2019-12-12 ENCOUNTER — Other Ambulatory Visit: Payer: Self-pay

## 2019-12-12 ENCOUNTER — Encounter: Payer: Self-pay | Admitting: Nurse Practitioner

## 2019-12-12 ENCOUNTER — Telehealth (INDEPENDENT_AMBULATORY_CARE_PROVIDER_SITE_OTHER): Payer: Medicare Other | Admitting: Nurse Practitioner

## 2019-12-12 DIAGNOSIS — M5441 Lumbago with sciatica, right side: Secondary | ICD-10-CM | POA: Diagnosis not present

## 2019-12-12 DIAGNOSIS — M4316 Spondylolisthesis, lumbar region: Secondary | ICD-10-CM | POA: Diagnosis not present

## 2019-12-12 DIAGNOSIS — M4807 Spinal stenosis, lumbosacral region: Secondary | ICD-10-CM | POA: Diagnosis not present

## 2019-12-12 DIAGNOSIS — M48062 Spinal stenosis, lumbar region with neurogenic claudication: Secondary | ICD-10-CM | POA: Diagnosis not present

## 2019-12-12 DIAGNOSIS — G8929 Other chronic pain: Secondary | ICD-10-CM

## 2019-12-12 HISTORY — PX: OTHER SURGICAL HISTORY: SHX169

## 2019-12-12 NOTE — Progress Notes (Signed)
This service is provided via telemedicine  No vital signs collected/recorded due to the encounter was a telemedicine visit.   Location of patient (ex: home, work):  Home  Patient consents to a telephone visit:  Yes, see encounter dated 06/072021  Location of the provider (ex: office, home):  [facility]  Name of any referring provider:  Bufford Spikes, DO  Names of all persons participating in the telemedicine service and their role in the encounter:  Abbey Chatters, Nurse Practitioner, Elveria Royals, CMA, and patient.   Time spent on call:  11 minutes with medical assistant

## 2019-12-12 NOTE — Progress Notes (Signed)
Careteam: Patient Care Team: Sharon Seller, NP as PCP - General (Geriatric Medicine)  PLACE OF SERVICE:  [facility] Advanced Directive information    Allergies  Allergen Reactions  . Aspirin Hives  . Lyrica [Pregabalin]     Nightmares, did not help neuropathy  . Penicillins Hives    Chief Complaint  Patient presents with  . Acute Visit    Surgery update. Patient had back surgery and requested this appointment to give an update on the surgery.     HPI: Patient is a 59 y.o. female for update on surgery  Back at home after procedure this morning. [facility]. Reports that surgeon stated if this does not help would recommend rods and pins to back to help straighten out spine.   He said to do absolutely nothing for the next few weeks, not to bend, lift or turn  Reports it may help with sleeping habit.  Pain is not severe at this time.    Reports Laminectomy and foraminotomy at L5-S1 written on paperwork.   Review of Systems:  Review of Systems  Constitutional: Negative for chills, fever and weight loss.  Musculoskeletal: Positive for back pain.  Neurological: Positive for tingling.    Past Medical History:  Diagnosis Date  . Acute bronchitis   . Cervical high risk HPV (human papillomavirus) test positive 12/2017   Negative subtype 16, 18/45  . Chronic back pain   . Complication of anesthesia    "didn't wake up"  . Cough   . Insomnia, unspecified   . Other malaise and fatigue   . Other specified diseases of blood and blood-forming organs(289.89)   . Tobacco use disorder    Past Surgical History:  Procedure Laterality Date  . ESOPHAGOGASTRODUODENOSCOPY N/A 03/17/2013   Procedure: ESOPHAGOGASTRODUODENOSCOPY (EGD);  Surgeon: Meryl Dare, MD;  Location[facility] ENDOSCOPY;  Service: Endoscopy;  Laterality: N/A;  . FRACTURE SURGERY    . TONSILLECTOMY     Social History:   reports that she has been smoking  cigarettes. She has a 6.00 pack-year smoking history. She has never used smokeless tobacco. She reports previous alcohol use. She reports that she does not use drugs.  Family History  Problem Relation Age of Onset  . Arthritis Mother   . Hypertension Sister   . Hypertension Brother   . Hypertension Sister   . Hypertension Sister   . Hypertension Sister     Medications: Patient's Medications  New Prescriptions   No medications on file  Previous Medications   ALBUTEROL (VENTOLIN HFA) 108 (90 BASE) MCG/ACT INHALER    INHALE 2 PUFFS INTO THE LUNGS EVERY 6 HOURS AS NEEDED FOR WHEEZING OR SHORTNESS OF BREATH   CYCLOBENZAPRINE (FLEXERIL) 10 MG TABLET    Take 10 mg by mouth 3 (three) times daily.   DIPHENHYDRAMINE (BENADRYL ALLERGY) 25 MG TABLET    Take 25 mg by mouth as needed.   FOLIC ACID (FOLVITE) 1 MG TABLET    Take 1 mg by mouth daily.   LOSARTAN (COZAAR) 25 MG TABLET    TAKE 1 TABLET(25 MG) BY MOUTH DAILY   OXYCODONE-ACETAMINOPHEN (PERCOCET/ROXICET) 5-325 MG TABLET    Take one tablet by mouth every 8-12 hours as needed for pain.  Modified Medications   No medications on file  Discontinued Medications   No medications on file    Physical Exam:  There were no vitals filed for this visit. There is no height or  weight on file to calculate BMI. Wt Readings from Last 3 Encounters:  10/25/19 217 lb (98.4 kg)  04/17/19 217 lb (98.4 kg)  03/07/19 223 lb 3.2 oz (101.2 kg)   Labs reviewed: Basic Metabolic Panel: Recent Labs    02/17/19 1102  NA 137  K 3.7  CL 103  CO2 25  GLUCOSE 95  BUN 8  CREATININE 0.59  CALCIUM 9.4   Liver Function Tests: Recent Labs    02/17/19 1102  AST 22  ALT 14  BILITOT 0.8  PROT 6.8   No results for input(s): LIPASE, AMYLASE in the last 8760 hours. No results for input(s): AMMONIA in the last 8760 hours. CBC: Recent Labs    02/17/19 1102  WBC 4.5  NEUTROABS 2,691  HGB 13.1  HCT 36.8  MCV 112.2*  PLT 154   Lipid Panel: Recent  Labs    02/17/19 1102  CHOL 184  HDL 93  LDLCALC 76  TRIG 71  CHOLHDL 2.0   TSH: No results for input(s): TSH in the last 8760 hours. A1C: Lab Results  Component Value Date   HGBA1C 4.8 07/01/2015     Assessment/Plan 1. Chronic right-sided low back pain with right-sided sciatica Doing well post-op at this time,s/p Laminectomy and foraminotomy at L5-S1   -strict precautions with limiting movement. Pain control with oxycodone-apap.  Reports if this procedure is not successful does not want additional surgery    Harvir Patry K. Biagio Borg  [facility] & Adult Medicine [phone]   Virtual Visit via Video Note  I connected with [name] on 12/12/19 at  2:15 PM EDT by a video enabled telemedicine application and verified that I am speaking with the correct person using two identifiers.  Location: Patient: home Provider: [facility]   I discussed the limitations of evaluation and management by telemedicine and the availability of in person appointments. The patient expressed understanding and agreed to proceed.    I discussed the assessment and treatment plan with the patient. The patient was provided an opportunity to ask questions and all were answered. The patient agreed with the plan and demonstrated an understanding of the instructions.   The patient was advised to call back or seek an in-person evaluation if the symptoms worsen or if the condition fails to improve as anticipated.  I provided 10 minutes of non-face-to-face time during this encounter.  Janene Harvey. Janyth Contes, AGNP Avs printed and mailed.

## 2019-12-22 ENCOUNTER — Other Ambulatory Visit: Payer: Self-pay

## 2019-12-22 DIAGNOSIS — M5441 Lumbago with sciatica, right side: Secondary | ICD-10-CM

## 2019-12-22 DIAGNOSIS — G8929 Other chronic pain: Secondary | ICD-10-CM

## 2019-12-22 MED ORDER — OXYCODONE-ACETAMINOPHEN 5-325 MG PO TABS: ORAL_TABLET | ORAL | 0 refills | Status: DC

## 2019-12-22 NOTE — Telephone Encounter (Signed)
Last filled in epic on 11/21/2019   Treatment agreement on file from 04/16/2020

## 2020-01-19 ENCOUNTER — Other Ambulatory Visit: Payer: Self-pay | Admitting: *Deleted

## 2020-01-19 DIAGNOSIS — G8929 Other chronic pain: Secondary | ICD-10-CM

## 2020-01-19 MED ORDER — OXYCODONE-ACETAMINOPHEN 5-325 MG PO TABS: ORAL_TABLET | ORAL | 0 refills | Status: DC

## 2020-01-19 NOTE — Telephone Encounter (Signed)
Patient requested refill Epic LR: 12/22/2019 Contract on file Pended Rx and sent to [facility] for approval.  

## 2020-01-26 ENCOUNTER — Encounter: Payer: Self-pay | Admitting: Nurse Practitioner

## 2020-01-26 ENCOUNTER — Other Ambulatory Visit: Payer: Self-pay

## 2020-01-26 ENCOUNTER — Telehealth (INDEPENDENT_AMBULATORY_CARE_PROVIDER_SITE_OTHER): Payer: Medicare Other | Admitting: Nurse Practitioner

## 2020-01-26 DIAGNOSIS — G8929 Other chronic pain: Secondary | ICD-10-CM

## 2020-01-26 DIAGNOSIS — Z720 Tobacco use: Secondary | ICD-10-CM | POA: Diagnosis not present

## 2020-01-26 DIAGNOSIS — M5441 Lumbago with sciatica, right side: Secondary | ICD-10-CM

## 2020-01-26 DIAGNOSIS — L853 Xerosis cutis: Secondary | ICD-10-CM

## 2020-01-26 DIAGNOSIS — I10 Essential (primary) hypertension: Secondary | ICD-10-CM

## 2020-01-26 DIAGNOSIS — J302 Other seasonal allergic rhinitis: Secondary | ICD-10-CM | POA: Diagnosis not present

## 2020-01-26 DIAGNOSIS — E538 Deficiency of other specified B group vitamins: Secondary | ICD-10-CM

## 2020-01-26 NOTE — Progress Notes (Signed)
Careteam: Patient Care Team: Sharon Seller, NP as PCP - General (Geriatric Medicine)  Advanced Directive information Does Patient Have a Medical Advance Directive?: No, Would patient like information on creating a medical advance directive?: Yes (MAU/Ambulatory/Procedural Areas - Information given)  Allergies  Allergen Reactions   Aspirin Hives   Lyrica [Pregabalin]     Nightmares, did not help neuropathy   Penicillins Hives    Chief Complaint  Patient presents with   Medical Management of Chronic Issues    3 month follow-up. Patient plans to get flu vaccine at [facility]. Video visit.      HPI: Patient is a 59 y.o. female for routine follow up.  Reports her nose and eyes running, itchy eyes and nasal congestion.  Taking benadryl 3 times a day  Reports back pain is "coming along" a big knot in the back.  Had follow up and got good report, next follow up on 10/13.  Continues on restrictions with bending and lifting.  Engineer, civil (consulting) was recommending a more extensive surgery but not going to consider it.   Smoker- cutting back, but "has stop drinking ETOH and smoking weed"  ETOH abuse- continues cessation  htn- continues on losartan, occasionally checks bp 130s/60s generally.    Review of Systems:  Review of Systems  Constitutional: Negative for chills, fever and weight loss.  HENT: Positive for congestion. Negative for hearing loss, sore throat and tinnitus.   Respiratory: Negative for cough, sputum production and shortness of breath.   Cardiovascular: Negative for chest pain, palpitations and leg swelling.  Gastrointestinal: Negative for abdominal pain, constipation, diarrhea and heartburn.  Genitourinary: Negative for dysuria, frequency and urgency.  Musculoskeletal: Positive for back pain. Negative for falls, joint pain and myalgias.  Skin: Negative.   Neurological: Negative for dizziness and headaches.  Psychiatric/Behavioral: Negative for depression  and memory loss. The patient does not have insomnia.     Past Medical History:  Diagnosis Date   Acute bronchitis    Cervical high risk HPV (human papillomavirus) test positive 12/2017   Negative subtype 16, 18/45   Chronic back pain    Complication of anesthesia    "didn't wake up"   Cough    Insomnia, unspecified    Other malaise and fatigue    Other specified diseases of blood and blood-forming organs(289.89)    Tobacco use disorder    Past Surgical History:  Procedure Laterality Date   ESOPHAGOGASTRODUODENOSCOPY N/A 03/17/2013   Procedure: ESOPHAGOGASTRODUODENOSCOPY (EGD);  Surgeon: Meryl Dare, MD;  Location: [facility] ENDOSCOPY;  Service: Endoscopy;  Laterality: N/A;   FRACTURE SURGERY     Laminectomy and foraminotomy at L5-S1    12/12/2019   TONSILLECTOMY     Social History:   reports that she has been smoking cigarettes. She has a 6.00 pack-year smoking history. She has never used smokeless tobacco. She reports previous alcohol use. She reports that she does not use drugs.  Family History  Problem Relation Age of Onset   Arthritis Mother    Hypertension Sister    Hypertension Brother    Hypertension Sister    Hypertension Sister    Hypertension Sister     Medications: Patient's Medications  New Prescriptions   No medications on file  Previous Medications   ALBUTEROL (VENTOLIN HFA) 108 (90 BASE) MCG/ACT INHALER    INHALE 2 PUFFS INTO THE LUNGS EVERY 6 HOURS AS NEEDED FOR WHEEZING OR SHORTNESS OF BREATH   CYCLOBENZAPRINE (FLEXERIL) 10 MG TABLET  Take 10 mg by mouth 3 (three) times daily.   DIPHENHYDRAMINE (BENADRYL ALLERGY) 25 MG TABLET    Take 25 mg by mouth as needed.   FOLIC ACID (FOLVITE) 1 MG TABLET    Take 1 mg by mouth daily.   LOSARTAN (COZAAR) 25 MG TABLET    TAKE 1 TABLET(25 MG) BY MOUTH DAILY   OXYCODONE-ACETAMINOPHEN (PERCOCET/ROXICET) 5-325 MG TABLET    Take one tablet by mouth every 8-12 hours as needed for pain.  Modified  Medications   No medications on file  Discontinued Medications   No medications on file    Physical Exam:  There were no vitals filed for this visit. There is no height or weight on file to calculate BMI. Wt Readings from Last 3 Encounters:  10/25/19 217 lb (98.4 kg)  04/17/19 217 lb (98.4 kg)  03/07/19 223 lb 3.2 oz (101.2 kg)      Labs reviewed: Basic Metabolic Panel: Recent Labs    02/17/19 1102  NA 137  K 3.7  CL 103  CO2 25  GLUCOSE 95  BUN 8  CREATININE 0.59  CALCIUM 9.4   Liver Function Tests: Recent Labs    02/17/19 1102  AST 22  ALT 14  BILITOT 0.8  PROT 6.8   No results for input(s): LIPASE, AMYLASE in the last 8760 hours. No results for input(s): AMMONIA in the last 8760 hours. CBC: Recent Labs    02/17/19 1102  WBC 4.5  NEUTROABS 2,691  HGB 13.1  HCT 36.8  MCV 112.2*  PLT 154   Lipid Panel: Recent Labs    02/17/19 1102  CHOL 184  HDL 93  LDLCALC 76  TRIG 71  CHOLHDL 2.0   TSH: No results for input(s): TSH in the last 8760 hours. A1C: Lab Results  Component Value Date   HGBA1C 4.8 07/01/2015     Assessment/Plan 1. Seasonal allergies To stop benadryl due to side effects -zyrtec or Claritin 10 mg daily OTC   -can use flonase 1 spray into bilateral nares twice daily as needed -encouraged to stop smoking.  2. Chronic right-sided low back pain with right-sided sciatica Ongoing, continues on oxycodone-apap, following with neurosurgery s/p recent laminectomy, continues with activity restrictions.   3. Folic acid deficiency -continues on folic acid daily   4. Tobacco abuse Encourage cessation.   5. Essential hypertension -controlled on losartan.   6. Dry skin Discussed to schedule an in office visit if this persist or can make appt with dermatology, unable to visualize area effectively via mychart visit.    Next appt: labs to be scheduled 6 months for routine follow up.  Janene Harvey. Biagio Borg  [facility] [phone]    Virtual Visit via Earleen Reaper  I connected with patient on 01/26/20 at  3:15 PM EDT by mychart/video and verified that I am speaking with the correct person using two identifiers.  Location: Patient: home Provider: office    I discussed the limitations, risks, security and privacy concerns of performing an evaluation and management service by telephone and the availability of in person appointments. I also discussed with the patient that there may be a patient responsible charge related to this service. The patient expressed understanding and agreed to proceed.   I discussed the assessment and treatment plan with the patient. The patient was provided an opportunity to ask questions and all were answered. The patient agreed with the plan and demonstrated an understanding of the instructions.  The patient was advised to call back or seek an in-person evaluation if the symptoms worsen or if the condition fails to improve as anticipated.  I provided 20 minutes of non-face-to-face time during this encounter.  Janene Harvey. Biagio Borg Avs printed and mailed

## 2020-01-26 NOTE — Progress Notes (Signed)
   This service is provided via telemedicine  No vital signs collected/recorded due to the encounter was a telemedicine visit.   Location of patient (ex: home, work):  Home  Patient consents to a telephone visit: Yes, see telephone encounter dated 09/25/19 with annual consent   Location of the provider (ex: office, home):  [facility], Office   Name of any referring provider: N/A  Names of all persons participating in the telemedicine service and their role in the encounter:  [name], Abbey Chatters, NP, and Patient   Time spent on call:  7 min with medical assistant

## 2020-01-26 NOTE — Patient Instructions (Signed)
STOP benadryl   START zyrtec or Claritin 10 mg by mouth daily Can also use Flonase 1 spray twice daily for increase in nasal congestion.    Allergic rhinitis is a reaction to allergens in the air. Allergens are tiny specks (particles) in the air that cause your body to have an allergic reaction. This condition cannot be passed from person to person (is not contagious). Allergic rhinitis cannot be cured, but it can be controlled. There are two types of allergic rhinitis:  Seasonal. This type is also called hay fever. It happens only during certain times of the year.  Perennial. This type can happen at any time of the year. What are the causes? This condition may be caused by:  Pollen from grasses, trees, and weeds.  House dust mites.  Pet dander.  Mold. What are the signs or symptoms? Symptoms of this condition include:  Sneezing.  Runny or stuffy nose (nasal congestion).  A lot of mucus in the back of the throat (postnasal drip).  Itchy nose.  Tearing of the eyes.  Trouble sleeping.  Being sleepy during day. How is this treated? There is no cure for this condition. You should avoid things that trigger your symptoms (allergens). Treatment can help to relieve symptoms. This may include:  Medicines that block allergy symptoms, such as antihistamines. These may be given as a shot, nasal spray, or pill.  Shots that are given until your body becomes less sensitive to the allergen (desensitization).  Stronger medicines, if all other treatments have not worked. Follow these instructions at home: Avoiding allergens   Find out what you are allergic to. Common allergens include smoke, dust, and pollen.  Avoid them if you can. These are some of the things that you can do to avoid allergens: ? Replace carpet with wood, tile, or vinyl flooring. Carpet can trap dander and dust. ? Clean any mold found in the home. ? Do not smoke. Do not allow smoking in your home. ? Change  your heating and air conditioning filter at least once a month. ? During allergy season:  Keep windows closed as much as you can. If possible, use air conditioning when there is a lot of pollen in the air.  Use a special filter for allergies with your furnace and air conditioner.  Plan outdoor activities when pollen counts are lowest. This is usually during the early morning or evening hours.  If you do go outdoors when pollen count is high, wear a special mask for people with allergies.  When you come indoors, take a shower and change your clothes before sitting on furniture or bedding. General instructions  Do not use fans in your home.  Do not hang clothes outside to dry.  Wear sunglasses to keep pollen out of your eyes.  Wash your hands right away after you touch household pets.  Take over-the-counter and prescription medicines only as told by your doctor.  Keep all follow-up visits as told by your doctor. This is important. Contact a doctor if:  You have a fever.  You have a cough that does not go away (is persistent).  You start to make whistling sounds when you breathe (wheeze).  Your symptoms do not get better with treatment.  You have thick fluid coming from your nose.  You start to have nosebleeds. Get help right away if:  Your tongue or your lips are swollen.  You have trouble breathing.  You feel dizzy or you feel like you are going  to pass out (faint).  You have cold sweats. Summary  Allergic rhinitis is a reaction to allergens in the air.  This condition may be caused by allergens. These include pollen, dust mites, pet dander, and mold.  Symptoms include a runny, itchy nose, sneezing, or tearing eyes. You may also have trouble sleeping or feel sleepy during the day.  Treatment includes taking medicines and avoiding allergens. You may also get shots or take stronger medicines.  Get help if you have a fever or a cough that does not stop. Get help  right away if you are short of breath. This information is not intended to replace advice given to you by your health care provider. Make sure you discuss any questions you have with your health care provider. Document Revised: 07/26/2018 Document Reviewed: 10/26/2017 Elsevier Patient Education  2020 [employer].

## 2020-01-29 DIAGNOSIS — M48061 Spinal stenosis, lumbar region without neurogenic claudication: Secondary | ICD-10-CM | POA: Diagnosis not present

## 2020-02-02 ENCOUNTER — Ambulatory Visit: Payer: Medicare Other | Admitting: Nurse Practitioner

## 2020-02-06 DIAGNOSIS — M48061 Spinal stenosis, lumbar region without neurogenic claudication: Secondary | ICD-10-CM | POA: Diagnosis not present

## 2020-02-07 ENCOUNTER — Encounter: Payer: Self-pay | Admitting: Nurse Practitioner

## 2020-02-07 ENCOUNTER — Ambulatory Visit (INDEPENDENT_AMBULATORY_CARE_PROVIDER_SITE_OTHER): Payer: Medicare Other | Admitting: Nurse Practitioner

## 2020-02-07 ENCOUNTER — Other Ambulatory Visit: Payer: Self-pay

## 2020-02-07 VITALS — BP 128/82 | HR 92 | Temp 96.4°F | Ht 65.0 in | Wt 205.0 lb

## 2020-02-07 DIAGNOSIS — I10 Essential (primary) hypertension: Secondary | ICD-10-CM | POA: Diagnosis not present

## 2020-02-07 DIAGNOSIS — Z23 Encounter for immunization: Secondary | ICD-10-CM

## 2020-02-07 DIAGNOSIS — G8929 Other chronic pain: Secondary | ICD-10-CM | POA: Diagnosis not present

## 2020-02-07 DIAGNOSIS — M5441 Lumbago with sciatica, right side: Secondary | ICD-10-CM

## 2020-02-07 DIAGNOSIS — L853 Xerosis cutis: Secondary | ICD-10-CM

## 2020-02-07 DIAGNOSIS — E538 Deficiency of other specified B group vitamins: Secondary | ICD-10-CM | POA: Diagnosis not present

## 2020-02-07 NOTE — Patient Instructions (Signed)
To cut back on hand sanitizer To continue to stay hydrated and use moisturizing hand/body cream  Can use mild Exfoliant to hands, feet and body to get rid of dead skin

## 2020-02-07 NOTE — Progress Notes (Signed)
Careteam: Patient Care Team: Lauree Chandler, NP as PCP - General (Geriatric Medicine)  PLACE OF SERVICE:  [place]  Advanced Directive information    Allergies  Allergen Reactions  . Aspirin Hives  . Lyrica [Pregabalin]     Nightmares, did not help neuropathy  . Penicillins Hives    Chief Complaint  Patient presents with  . Acute Visit    Skin check and fasting labs      HPI: Patient is a 59 y.o. female to follow up on skin that is flaking on hands and feet.  Pt reports uses a lot of hand sanitizer and skin on hands peeling. Also with flaking skin on feet.  No pain, swelling, redness, itching noted.  Does not use moisturizer to hands or feet.    Review of Systems:  Review of Systems  Constitutional: Negative for chills, fever and weight loss.  HENT: Negative for tinnitus.   Respiratory: Negative for cough, sputum production and shortness of breath.   Cardiovascular: Negative for chest pain, palpitations and leg swelling.  Gastrointestinal: Negative for abdominal pain, constipation, diarrhea and heartburn.  Genitourinary: Negative for dysuria, frequency and urgency.  Musculoskeletal: Positive for back pain, joint pain and myalgias.  Skin: Negative.   Neurological: Negative for dizziness and headaches.  Psychiatric/Behavioral: Negative for depression and memory loss. The patient does not have insomnia.     Past Medical History:  Diagnosis Date  . Acute bronchitis   . Cervical high risk HPV (human papillomavirus) test positive 12/2017   Negative subtype 16, 18/45  . Chronic back pain   . Complication of anesthesia    "didn't wake up"  . Cough   . Insomnia, unspecified   . Other malaise and fatigue   . Other specified diseases of blood and blood-forming organs(289.89)   . Tobacco use disorder    Past Surgical History:  Procedure Laterality Date  . ESOPHAGOGASTRODUODENOSCOPY N/A 03/17/2013   Procedure: ESOPHAGOGASTRODUODENOSCOPY (EGD);  Surgeon:  Ladene Artist, MD;  Location: [facility] ENDOSCOPY;  Service: Endoscopy;  Laterality: N/A;  . FRACTURE SURGERY    . Laminectomy and foraminotomy at L5-S1    12/12/2019  . TONSILLECTOMY     Social History:   reports that she has been smoking cigarettes. She has a 6.00 pack-year smoking history. She has never used smokeless tobacco. She reports previous alcohol use. She reports that she does not use drugs.  Family History  Problem Relation Age of Onset  . Arthritis Mother   . Hypertension Sister   . Hypertension Brother   . Hypertension Sister   . Hypertension Sister   . Hypertension Sister     Medications: Patient's Medications  New Prescriptions   No medications on file  Previous Medications   ALBUTEROL (VENTOLIN HFA) 108 (90 BASE) MCG/ACT INHALER    INHALE 2 PUFFS INTO THE LUNGS EVERY 6 HOURS AS NEEDED FOR WHEEZING OR SHORTNESS OF BREATH   CYCLOBENZAPRINE (FLEXERIL) 10 MG TABLET    Take 10 mg by mouth 3 (three) times daily.   DIPHENHYDRAMINE (BENADRYL ALLERGY) 25 MG TABLET    Take 25 mg by mouth as needed.   FOLIC ACID (FOLVITE) 1 MG TABLET    Take 1 mg by mouth daily.   LOSARTAN (COZAAR) 25 MG TABLET    TAKE 1 TABLET(25 MG) BY MOUTH DAILY   OXYCODONE-ACETAMINOPHEN (PERCOCET/ROXICET) 5-325 MG TABLET    Take one tablet by mouth every 8-12 hours as needed for pain.  Modified Medications  No medications on file  Discontinued Medications   No medications on file    Physical Exam:  Vitals:   02/07/20 1141  BP: 128/82  Pulse: 92  Temp: (!) 96.4 F (35.8 C)  TempSrc: Temporal  SpO2: 96%  Weight: 205 lb (93 kg)  Height: '5\' 5"'  (1.651 m)   Body mass index is 34.11 kg/m. Wt Readings from Last 3 Encounters:  02/07/20 205 lb (93 kg)  10/25/19 217 lb (98.4 kg)  04/17/19 217 lb (98.4 kg)    Physical Exam Constitutional:      General: She is not in acute distress.    Appearance: She is well-developed. She is not diaphoretic.  HENT:     Head: Normocephalic and atraumatic.      Mouth/Throat:     Pharynx: No oropharyngeal exudate.  Eyes:     Conjunctiva/sclera: Conjunctivae normal.     Pupils: Pupils are equal, round, and reactive to light.  Cardiovascular:     Rate and Rhythm: Normal rate and regular rhythm.     Heart sounds: Normal heart sounds.  Pulmonary:     Effort: Pulmonary effort is normal.     Breath sounds: Normal breath sounds.  Abdominal:     General: Bowel sounds are normal.     Palpations: Abdomen is soft.  Musculoskeletal:        General: No tenderness.     Cervical back: Normal range of motion and neck supple.     Comments: Wearing back brace  Skin:    General: Skin is warm and dry.     Comments: Dry flaking skin to bilateral hands. Without skin breakdown, redness, swelling, heat or pain noted.   Neurological:     Mental Status: She is alert and oriented to person, place, and time.     Labs reviewed: Basic Metabolic Panel: Recent Labs    02/17/19 1102  NA 137  K 3.7  CL 103  CO2 25  GLUCOSE 95  BUN 8  CREATININE 0.59  CALCIUM 9.4   Liver Function Tests: Recent Labs    02/17/19 1102  AST 22  ALT 14  BILITOT 0.8  PROT 6.8   No results for input(s): LIPASE, AMYLASE in the last 8760 hours. No results for input(s): AMMONIA in the last 8760 hours. CBC: Recent Labs    02/17/19 1102  WBC 4.5  NEUTROABS 2,691  HGB 13.1  HCT 36.8  MCV 112.2*  PLT 154   Lipid Panel: Recent Labs    02/17/19 1102  CHOL 184  HDL 93  LDLCALC 76  TRIG 71  CHOLHDL 2.0   TSH: No results for input(s): TSH in the last 8760 hours. A1C: Lab Results  Component Value Date   HGBA1C 4.8 07/01/2015     Assessment/Plan 1. Chronic right-sided low back pain with right-sided sciatica - ongoing back pain, continues on oxycodone-apap, s/p laminectomy. Plan to restart PT at this time. - Urine Drug Screen w/Alc, no confirm([facility])  2. Essential hypertension Controlled on losartan 25 mg daily with dietary modifications encouraged.  - CMP  with eGFR([facility]) - CBC with Differential/Platelet  3. Dry skin -to hands, feet, legs and arms.  -encouraged to limit hand sanitizer and after washing hands use moisturizing cream. Also can use a mild exfoliant to remove excess flaking skin.  4. Folic acid deficiency -continues on folic acid suppelement - B12 and Folate Panel  5. Need for influenza vaccination - Flu Vaccine QUAD 6+ mos PF IM (Fluarix Quad PF)  Next  appt: 4 months.  Carlos American. [place], [place] [facility] [phone]

## 2020-02-08 ENCOUNTER — Other Ambulatory Visit: Payer: Self-pay

## 2020-02-08 DIAGNOSIS — D539 Nutritional anemia, unspecified: Secondary | ICD-10-CM

## 2020-02-08 DIAGNOSIS — D619 Aplastic anemia, unspecified: Secondary | ICD-10-CM

## 2020-02-08 DIAGNOSIS — E538 Deficiency of other specified B group vitamins: Secondary | ICD-10-CM

## 2020-02-08 LAB — DRUG MONITOR, PANEL 1, SCREEN, URINE
Amphetamines: NEGATIVE ng/mL (ref <500)
Barbiturates: NEGATIVE ng/mL (ref <300)
Benzodiazepines: NEGATIVE ng/mL (ref <100)
Cocaine Metabolite: NEGATIVE ng/mL (ref <150)
Creatinine: 157.7 mg/dL
Marijuana Metabolite: NEGATIVE ng/mL (ref <20)
Methadone Metabolite: NEGATIVE ng/mL (ref <100)
Opiates: POSITIVE ng/mL — AB (ref <100)
Oxidant: NEGATIVE ug/mL
Oxycodone: POSITIVE ng/mL — AB (ref <100)
Phencyclidine: NEGATIVE ng/mL (ref <25)
pH: 7.2 (ref 4.5–9.0)

## 2020-02-08 LAB — COMPLETE METABOLIC PANEL WITH GFR
AG Ratio: 1.4 (calc) (ref 1.0–2.5)
ALT: 20 U/L (ref 6–29)
AST: 41 U/L — ABNORMAL HIGH (ref 10–35)
Albumin: 4 g/dL (ref 3.6–5.1)
Alkaline phosphatase (APISO): 120 U/L (ref 37–153)
BUN/Creatinine Ratio: 20 (calc) (ref 6–22)
BUN: 10 mg/dL (ref 7–25)
CO2: 27 mmol/L (ref 20–32)
Calcium: 9.3 mg/dL (ref 8.6–10.4)
Chloride: 94 mmol/L — ABNORMAL LOW (ref 98–110)
Creat: 0.49 mg/dL — ABNORMAL LOW (ref 0.50–1.05)
GFR, Est African American: 125 mL/min/{1.73_m2} (ref >=60)
GFR, Est Non African American: 108 mL/min/{1.73_m2} (ref >=60)
Globulin: 2.8 g/dL (calc) (ref 1.9–3.7)
Glucose, Bld: 98 mg/dL (ref 65–139)
Potassium: 3.4 mmol/L — ABNORMAL LOW (ref 3.5–5.3)
Sodium: 135 mmol/L (ref 135–146)
Total Bilirubin: 2.5 mg/dL — ABNORMAL HIGH (ref 0.2–1.2)
Total Protein: 6.8 g/dL (ref 6.1–8.1)

## 2020-02-08 LAB — CBC WITH DIFFERENTIAL/PLATELET
Absolute Monocytes: 221 cells/uL (ref 200–950)
Basophils Absolute: 49 cells/uL (ref 0–200)
Basophils Relative: 1.4 %
Eosinophils Absolute: 39 cells/uL (ref 15–500)
Eosinophils Relative: 1.1 %
HCT: 30.6 % — ABNORMAL LOW (ref 35.0–45.0)
Hemoglobin: 11.1 g/dL — ABNORMAL LOW (ref 11.7–15.5)
Lymphs Abs: 571 cells/uL — ABNORMAL LOW (ref 850–3900)
MCH: 43.7 pg — ABNORMAL HIGH (ref 27.0–33.0)
MCHC: 36.3 g/dL — ABNORMAL HIGH (ref 32.0–36.0)
MCV: 120.5 fL — ABNORMAL HIGH (ref 80.0–100.0)
MPV: 12.2 fL (ref 7.5–12.5)
Monocytes Relative: 6.3 %
Neutro Abs: 2622 cells/uL (ref 1500–7800)
Neutrophils Relative %: 74.9 %
Platelets: 117 10*3/uL — ABNORMAL LOW (ref 140–400)
RBC: 2.54 10*6/uL — ABNORMAL LOW (ref 3.80–5.10)
RDW: 15.1 % — ABNORMAL HIGH (ref 11.0–15.0)
Total Lymphocyte: 16.3 %
WBC: 3.5 10*3/uL — ABNORMAL LOW (ref 3.8–10.8)

## 2020-02-08 LAB — B12 AND FOLATE PANEL
Folate: 1.7 ng/mL — ABNORMAL LOW
Vitamin B-12: 451 pg/mL (ref 200–1100)

## 2020-02-08 LAB — DM TEMPLATE

## 2020-02-08 MED ORDER — FOLIC ACID 1 MG PO TABS: 2.0000 mg | ORAL_TABLET | Freq: Every day | ORAL | 5 refills | Status: DC

## 2020-02-15 ENCOUNTER — Other Ambulatory Visit: Payer: Medicare Other

## 2020-02-15 ENCOUNTER — Other Ambulatory Visit: Payer: Self-pay

## 2020-02-15 DIAGNOSIS — D539 Nutritional anemia, unspecified: Secondary | ICD-10-CM | POA: Diagnosis not present

## 2020-02-15 LAB — CBC WITH DIFFERENTIAL/PLATELET
Absolute Monocytes: 562 cells/uL (ref 200–950)
Basophils Absolute: 70 cells/uL (ref 0–200)
Basophils Relative: 1.9 %
Eosinophils Absolute: 137 cells/uL (ref 15–500)
Eosinophils Relative: 3.7 %
HCT: 31.6 % — ABNORMAL LOW (ref 35.0–45.0)
Hemoglobin: 11.5 g/dL — ABNORMAL LOW (ref 11.7–15.5)
Lymphs Abs: 981 cells/uL (ref 850–3900)
MCH: 43.7 pg — ABNORMAL HIGH (ref 27.0–33.0)
MCHC: 36.4 g/dL — ABNORMAL HIGH (ref 32.0–36.0)
MCV: 120.2 fL — ABNORMAL HIGH (ref 80.0–100.0)
MPV: 11.1 fL (ref 7.5–12.5)
Monocytes Relative: 15.2 %
Neutro Abs: 1950 cells/uL (ref 1500–7800)
Neutrophils Relative %: 52.7 %
Platelets: 233 10*3/uL (ref 140–400)
RBC: 2.63 10*6/uL — ABNORMAL LOW (ref 3.80–5.10)
RDW: 16 % — ABNORMAL HIGH (ref 11.0–15.0)
Total Lymphocyte: 26.5 %
WBC: 3.7 10*3/uL — ABNORMAL LOW (ref 3.8–10.8)

## 2020-02-19 ENCOUNTER — Other Ambulatory Visit: Payer: Self-pay | Admitting: *Deleted

## 2020-02-19 DIAGNOSIS — G8929 Other chronic pain: Secondary | ICD-10-CM

## 2020-02-19 MED ORDER — OXYCODONE-ACETAMINOPHEN 5-325 MG PO TABS: ORAL_TABLET | ORAL | 0 refills | Status: DC

## 2020-02-19 NOTE — Telephone Encounter (Signed)
Patient requested refill Epic LR: 01/19/2020 Contract on [employer] [facility] and sent to [employer] for approval. Shanda Bumps out of office)

## 2020-03-20 ENCOUNTER — Other Ambulatory Visit: Payer: Self-pay

## 2020-03-20 DIAGNOSIS — G8929 Other chronic pain: Secondary | ICD-10-CM

## 2020-03-20 MED ORDER — OXYCODONE-ACETAMINOPHEN 5-325 MG PO TABS: ORAL_TABLET | ORAL | 0 refills | Status: DC

## 2020-03-20 NOTE — Telephone Encounter (Signed)
Patient called needing refill on Oxycodone 5/325 take one tablet every 8-12 hours as needed for pain.Last opioid agreement signed 04/17/2019. Medication pended and sent to Abbey Chatters, NP for approval.

## 2020-03-26 DIAGNOSIS — M4316 Spondylolisthesis, lumbar region: Secondary | ICD-10-CM | POA: Diagnosis not present

## 2020-03-26 DIAGNOSIS — I10 Essential (primary) hypertension: Secondary | ICD-10-CM | POA: Diagnosis not present

## 2020-03-29 ENCOUNTER — Other Ambulatory Visit: Payer: Self-pay | Admitting: Neurosurgery

## 2020-03-29 DIAGNOSIS — M4316 Spondylolisthesis, lumbar region: Secondary | ICD-10-CM

## 2020-03-31 ENCOUNTER — Other Ambulatory Visit: Payer: Self-pay

## 2020-03-31 ENCOUNTER — Encounter (HOSPITAL_COMMUNITY): Payer: Self-pay | Admitting: Emergency Medicine

## 2020-03-31 ENCOUNTER — Emergency Department (HOSPITAL_COMMUNITY)
Admission: EM | Admit: 2020-03-31 | Discharge: 2020-03-31 | Disposition: A | Discharge status: Home / Self Care | Payer: Medicare Other | Attending: Emergency Medicine | Admitting: Emergency Medicine

## 2020-03-31 DIAGNOSIS — F17210 Nicotine dependence, cigarettes, uncomplicated: Secondary | ICD-10-CM | POA: Diagnosis not present

## 2020-03-31 DIAGNOSIS — K122 Cellulitis and abscess of mouth: Secondary | ICD-10-CM | POA: Diagnosis not present

## 2020-03-31 DIAGNOSIS — K130 Diseases of lips: Secondary | ICD-10-CM | POA: Diagnosis not present

## 2020-03-31 DIAGNOSIS — K047 Periapical abscess without sinus: Secondary | ICD-10-CM

## 2020-03-31 MED ORDER — CLINDAMYCIN HCL 150 MG PO CAPS: 300.0000 mg | ORAL_CAPSULE | Freq: Three times a day (TID) | ORAL | 0 refills | Status: AC

## 2020-03-31 MED ORDER — CLINDAMYCIN HCL 150 MG PO CAPS
450.0000 mg | ORAL_CAPSULE | Freq: Once | ORAL | Status: AC
  Administered 2020-03-31: 450 mg via ORAL
  Filled 2020-03-31: qty 3

## 2020-03-31 MED ORDER — FAMOTIDINE IN NACL 20-0.9 MG/50ML-% IV SOLN
20.0000 mg | Freq: Once | INTRAVENOUS | Status: DC
  Filled 2020-03-31: qty 50

## 2020-03-31 MED ORDER — METHYLPREDNISOLONE SODIUM SUCC 125 MG IJ SOLR: 125.0000 mg | Freq: Once | INTRAMUSCULAR | Status: DC

## 2020-03-31 MED ORDER — SODIUM CHLORIDE 0.9 % IV SOLN: 25.0000 mg | INTRAVENOUS | Status: DC | PRN

## 2020-03-31 MED ORDER — LIDOCAINE HCL (PF) 1 % IJ SOLN
20.0000 mL | Freq: Once | INTRAMUSCULAR | Status: AC
  Administered 2020-03-31: 20 mL
  Filled 2020-03-31: qty 20

## 2020-03-31 NOTE — Discharge Instructions (Signed)
You had an abscess that was drained today with a needle. Please take the antibiotic I prescribed you for the full course.  Please follow-up with your primary care doctor in the next 2 days for reevaluation.  You may always return to the ER if you have any new or concerning symptoms.  Please keep an eye on your symptoms.  Worsening swelling should prompt you to return.  May use warm compresses in the area swish mouth with warm salt water regularly to clean.  Please follow-up with your dentist as well.

## 2020-03-31 NOTE — ED Triage Notes (Signed)
C/o angioedema since waking up yesterday morning.  Denies swelling to tongue, sore throat, or SOB.  Speaking in complete sentences.  Takes Losartan.

## 2020-03-31 NOTE — ED Provider Notes (Signed)
[facility] EMERGENCY DEPARTMENT Provider Note   CSN: [id number] Arrival date & time: 03/31/20  8937     History Chief Complaint  Patient presents with  . Angioedema    Laura [name] is a 59 y.o. female.  HPI Patient is 59 year old female with past medical history significant for obesity, anxiety, insomnia  Patient is presented today with lip swelling for 48 hours of the upper lip.  She states that it is swollen, tender and became progressively more swollen over the first 24 hours, the 24 hours since it achieved his Curnes appearance it has not changed at all.  She denies any difficulty opening and closing her mouth but states that it is severely painful for her to lift her upper lip.  She denies any fevers, dental pain, nausea, vomiting, lightheadedness, dizziness shortness of breath or difficulty swallowing.  Although she does note that she has had difficulty getting food into her mouth because of the swelling of her lip.  She states she has never had this happen before--notably when examined by my attending physician Dr. Stevie Kern she admits that she has had upper lip swelling in the past is never to this extent.  She is on an ARB medication which she has been on for quite some time.  She is uncertain exactly how long.  She has no history in her family of angioedema.    Past Medical History:  Diagnosis Date  . Acute bronchitis   . Cervical high risk HPV (human papillomavirus) test positive 12/2017   Negative subtype 16, 18/45  . Chronic back pain   . Complication of anesthesia    "didn't wake up"  . Cough   . Insomnia, unspecified   . Other malaise and fatigue   . Other specified diseases of blood and blood-forming organs(289.89)   . Tobacco use disorder     Patient Active Problem List   Diagnosis Date Noted  . Estrogen deficiency 09/30/2018  . Morbid obesity (HCC) 04/12/2018  . Macrocytic anemia 04/12/2018  . Tobacco abuse 04/12/2018  . Essential  hypertension 04/12/2018  . Body mass index (BMI) of 37.0-37.9 in adult 04/12/2018  . Obesity (BMI 30.0-34.9) 09/29/2016  . Low back pain 10/07/2015  . Chronic night sweats 03/03/2015  . Generalized anxiety disorder 06/07/2014  . Allergic rhinitis 03/02/2013  . Panic attack 09/05/2012  . Insomnia 07/28/2012  . Depression 07/28/2012  . OA (osteoarthritis) of knee 07/28/2012    Past Surgical History:  Procedure Laterality Date  . ESOPHAGOGASTRODUODENOSCOPY N/A 03/17/2013   Procedure: ESOPHAGOGASTRODUODENOSCOPY (EGD);  Surgeon: Meryl Dare, MD;  Location: [facility] ENDOSCOPY;  Service: Endoscopy;  Laterality: N/A;  . FRACTURE SURGERY    . Laminectomy and foraminotomy at L5-S1    12/12/2019  . TONSILLECTOMY       OB History    Gravida  0   Para  0   Term  0   Preterm  0   AB  0   Living  0     SAB  0   IAB  0   Ectopic  0   Multiple  0   Live Births  0           Family History  Problem Relation Age of Onset  . Arthritis Mother   . Hypertension Sister   . Hypertension Brother   . Hypertension Sister   . Hypertension Sister   . Hypertension Sister     Social History   Tobacco Use  . Smoking  status: Current Some Day Smoker    Packs/day: 0.50    Years: 12.00    Pack years: 6.00    Types: Cigarettes  . Smokeless tobacco: Never Used  Vaping Use  . Vaping Use: Former  Substance Use Topics  . Alcohol use: Not Currently  . Drug use: No    Home Medications Prior to Admission medications   Medication Sig Start Date End Date Taking? Authorizing Provider  albuterol (VENTOLIN HFA) 108 (90 Base) MCG/ACT inhaler INHALE 2 PUFFS INTO THE LUNGS EVERY 6 HOURS AS NEEDED FOR WHEEZING OR SHORTNESS OF BREATH 05/19/19   Sharon Seller, NP  clindamycin (CLEOCIN) 150 MG capsule Take 2 capsules (300 mg total) by mouth every 8 (eight) hours for 10 days. 03/31/20 04/10/20  Gailen Shelter, PA  cyclobenzaprine (FLEXERIL) 10 MG tablet Take 10 mg by mouth 3 (three) times  daily. 12/12/19   [provider]  diphenhydrAMINE (BENADRYL ALLERGY) 25 MG tablet Take 25 mg by mouth as needed.    [provider]  folic acid (FOLVITE) 1 MG tablet Take 2 tablets (2 mg total) by mouth daily. 02/08/20   Sharon Seller, NP  losartan (COZAAR) 25 MG tablet TAKE 1 TABLET(25 MG) BY MOUTH DAILY 07/24/19   Sharon Seller, NP  oxyCODONE-acetaminophen (PERCOCET/ROXICET) 5-325 MG tablet Take one tablet by mouth every 8-12 hours as needed for pain. 03/20/20   Sharon Seller, NP    Allergies    Aspirin, Lyrica [pregabalin], and Penicillins  Review of Systems   Review of Systems  Constitutional: Negative for chills and fever.  HENT: Negative for congestion.        Upper lip swelling  Eyes: Negative for pain.  Respiratory: Negative for cough and shortness of breath.   Cardiovascular: Negative for chest pain and leg swelling.  Gastrointestinal: Negative for abdominal pain and vomiting.  Genitourinary: Negative for dysuria.  Musculoskeletal: Negative for myalgias.  Skin: Negative for rash.  Neurological: Negative for dizziness and headaches.    Physical Exam Updated Vital Signs BP (!) 142/87   Pulse 87   Temp 97.8 F (36.6 C) (Oral)   Resp 18   SpO2 98%   Physical Exam Vitals and nursing note reviewed.  Constitutional:      General: She is not in acute distress. HENT:     Head: Normocephalic and atraumatic.     Nose: Nose normal.     Mouth/Throat:     Comments: Upper lip is diffusely swollen.  There is fluctuance with palpation in the area just inside the mouth from the frenulum of the upper lip.  She is notably without many teeth. Eyes:     General: No scleral icterus. Cardiovascular:     Rate and Rhythm: Normal rate and regular rhythm.     Pulses: Normal pulses.     Heart sounds: Normal heart sounds.  Pulmonary:     Effort: Pulmonary effort is normal. No respiratory distress.     Breath sounds: No wheezing.  Abdominal:      Palpations: Abdomen is soft.     Tenderness: There is no abdominal tenderness.  Musculoskeletal:     Cervical back: Normal range of motion.     Right lower leg: No edema.     Left lower leg: No edema.  Skin:    General: Skin is warm and dry.     Capillary Refill: Capillary refill takes less than 2 seconds.  Neurological:     Mental Status: She is  alert. Mental status is at baseline.  Psychiatric:        Mood and Affect: Mood normal.        Behavior: Behavior normal.     ED Results / Procedures / Treatments   Labs (all labs ordered are listed, but only abnormal results are displayed) Labs Reviewed - No data to display  EKG None  Radiology MR LUMBAR SPINE WO CONTRAST  Result Date: 04/01/2020 CLINICAL DATA:  Low back pain radiating to both legs EXAM: MRI LUMBAR SPINE WITHOUT CONTRAST TECHNIQUE: Multiplanar, multisequence MR imaging of the lumbar spine was performed. No intravenous contrast was administered. COMPARISON:  July 2021 FINDINGS: Motion artifact is present. Segmentation: Standard. Alignment:  Stable with mild anterolisthesis at L4-L5. Vertebrae: Stable vertebral body heights. Mild marrow edema associated with the L4-L5 facets on a degenerative basis. Conus medullaris and cauda equina: Conus extends to the L1-L2 level. Conus and cauda equina appear normal. Paraspinal and other soft tissues: New postoperative changes at L5-S1 Disc levels: There is congenital narrowing of the spinal canal. L1-L2: Moderate facet arthropathy. No significant canal or foraminal stenosis. L2-L3: Mild disc bulge. Moderate facet arthropathy with ligamentum flavum infolding. No significant canal or foraminal stenosis. L3-L4: Moderate facet arthropathy with ligamentum flavum infolding. No significant canal or foraminal stenosis. L4-L5: Anterolisthesis with uncovering of disc bulge and superimposed left foraminal extrusion. Marked facet arthropathy. Mild canal stenosis. Narrowing of the left subarticular  recess. Mild left foraminal stenosis with disc abutting the exiting L4 nerve root. No right foraminal stenosis. L5-S1: Interval posterior decompression. Disc bulge with endplate osteophytic ridging eccentric to the right. Moderate facet arthropathy. Similar effacement of the thecal sac ventrally. Decreased narrowing of the subarticular recesses. Mild right foraminal stenosis. There is improvement of right L5 nerve root extraforaminal compression. No left foraminal stenosis. IMPRESSION: Interval postoperative changes at L5-S1. Similar effacement of the thecal sac ventrally with persistent but decreased narrowing of the subarticular recesses and improvement of extraforaminal right L5 nerve root compression. Other levels are similar in appearance. There is a small left foraminal extrusion at L4-L5 abutting the exiting L4 nerve root. There is also narrowing of the left subarticular recess at this level. Electronically Signed   By: Guadlupe SpanishPraneil  Patel M.D.   On: 04/01/2020 21:07    Procedures .Marland Kitchen.Incision and Drainage  Date/Time: 04/01/2020 10:24 PM Performed by: Gailen ShelterFondaw, Crew Goren S, PA Authorized by: Gailen ShelterFondaw, Jalise Zawistowski S, PA   Consent:    Consent obtained:  Verbal   Consent given by:  Patient   Risks discussed:  Bleeding, incomplete drainage, pain and damage to other organs   Alternatives discussed:  No treatment Universal protocol:    Procedure explained and questions answered to patient or proxy's satisfaction: yes     Relevant documents present and verified: yes     Test results available : yes     Imaging studies available: yes     Required blood products, implants, devices, and special equipment available: yes     Site/side marked: yes     Immediately prior to procedure, a time out was called: yes     Patient identity confirmed:  Verbally with patient Location:    Indications for incision and drainage: Dental abscess.   Size:  1 cm   Location:  Mouth   Mouth location: Located in the gingiva of the  upper midline mouth over the gumline. Sedation:    Sedation type:  None Anesthesia:    Anesthesia method:  Local infiltration   Local anesthetic:  Lidocaine 1% w/o epi Procedure type:    Complexity:  Complex Procedure details:    Needle aspiration: yes     Needle size:  18 G   Incision depth:  Subcutaneous   Scalpel blade:  11   Drainage:  Purulent   Drainage amount:  Moderate (5-6 cc) Post-procedure details:    Procedure completion:  Tolerated well, no immediate complications   (including critical care time)  Medications Ordered in ED Medications  lidocaine (PF) (XYLOCAINE) 1 % injection 20 mL (20 mLs Infiltration Given by Other 03/31/20 1252)  clindamycin (CLEOCIN) capsule 450 mg (450 mg Oral Given 03/31/20 1320)    ED Course  I have reviewed the triage vital signs and the nursing notes.  Pertinent labs & imaging results that were available during my care of the patient were reviewed by me and considered in my medical decision making (see chart for details).    MDM Rules/Calculators/A&P                          Patient is 59 year old female is in today with swollen upper lip  Initially states that she has not had this before however, with evaluation and history taken by my attending physician, it appears that she has had this before with almost identical presentation at which time she had needle aspiration of dental abscess done.  Needle aspiration done by myself with 18-gauge needle.  This yielded 5-6 cc of purulent fluid.   Patient had some improvement in her pain after aspiration.  Is still uncomfortable and still has swollen lip however suspect that this is regional swelling related to abscess.  We will provide patient with clindamycin and have her follow-up with dentist and her PCP.  Given return precautions.  Doubt Ludwig's angina or other deep space infection such as PTA or RPA.  Patient has no trismus on exam.  She is now able to open her mouth wider and states  that the pain is improved.  She understands the strict return precautions.  Discharged with good understanding of plan and follow-up instructions.  Final Clinical Impression(s) / ED Diagnoses Final diagnoses:  Lip abscess  Dental abscess    Rx / DC Orders ED Discharge Orders         Ordered    clindamycin (CLEOCIN) 150 MG capsule  Every 8 hours        03/31/20 1309           Solon [place]a [place], [place] 04/01/20 2229    Milagros Loll, MD 04/02/20 1626

## 2020-04-01 ENCOUNTER — Ambulatory Visit
Admission: RE | Admit: 2020-04-01 | Discharge: 2020-04-01 | Disposition: A | Discharge status: Home / Self Care | Payer: Medicare Other | Source: Ambulatory Visit | Attending: Neurosurgery | Admitting: Neurosurgery

## 2020-04-01 DIAGNOSIS — M48061 Spinal stenosis, lumbar region without neurogenic claudication: Secondary | ICD-10-CM | POA: Diagnosis not present

## 2020-04-01 DIAGNOSIS — M4316 Spondylolisthesis, lumbar region: Secondary | ICD-10-CM

## 2020-04-01 DIAGNOSIS — M545 Low back pain, unspecified: Secondary | ICD-10-CM | POA: Diagnosis not present

## 2020-04-01 IMAGING — MR MR LUMBAR SPINE W/O CM
4 of 5 series · 17 of 48 positions shown · non-contrast
Comparison: [DATE]

CLINICAL DATA: Low back pain radiating to both legs

EXAM:
MRI LUMBAR SPINE WITHOUT CONTRAST
TECHNIQUE: Multiplanar, multisequence MR imaging of the lumbar spine was
performed. No intravenous contrast was administered.

[Series 6: T2 · sagittal · 4.0mm · 0.73mm/px · 5 of 15 slices shown (1 of 2)]
[im 1/15]
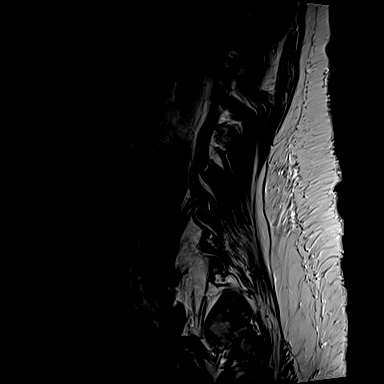
[im 4/15]
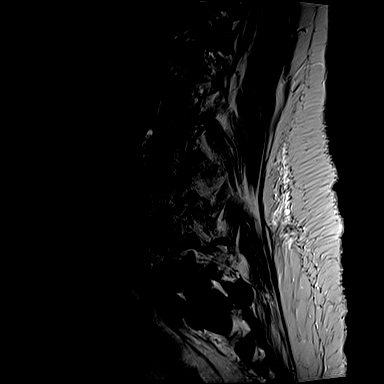
[im 8/15]
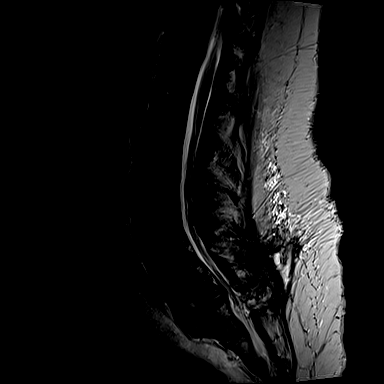
[im 11/15]
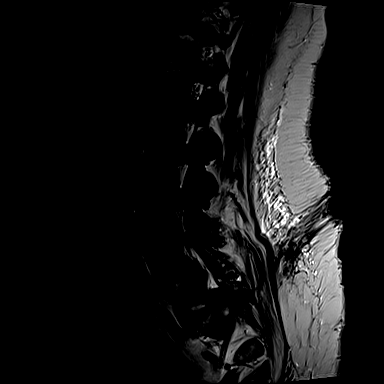
[im 15/15]
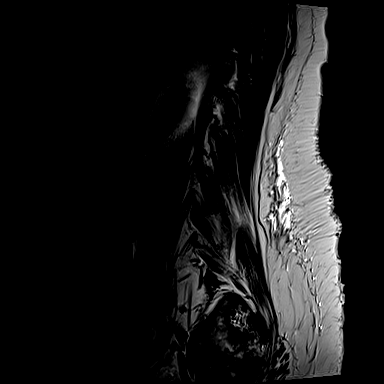

[Series 7: T1 · sagittal · 4.0mm · 0.73mm/px · 3 of 15 slices shown (1 of 2)]
[im 1/15]
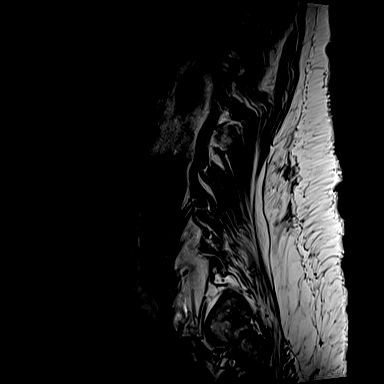
[im 8/15]
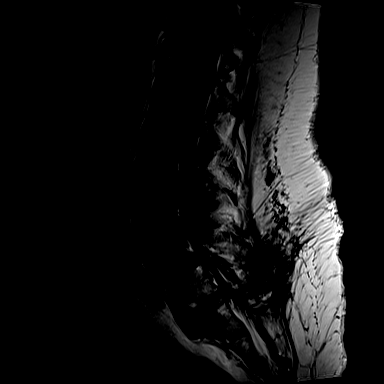
[im 15/15]
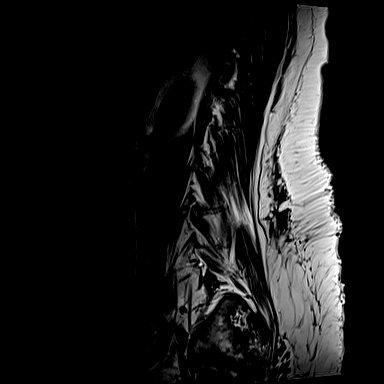

[Series 13: T2 · axial · 4.0mm · 0.28mm/px · z∈[-99,+96]mm · 6 of 42 slices shown (2 of 2)]
[im 3/42]
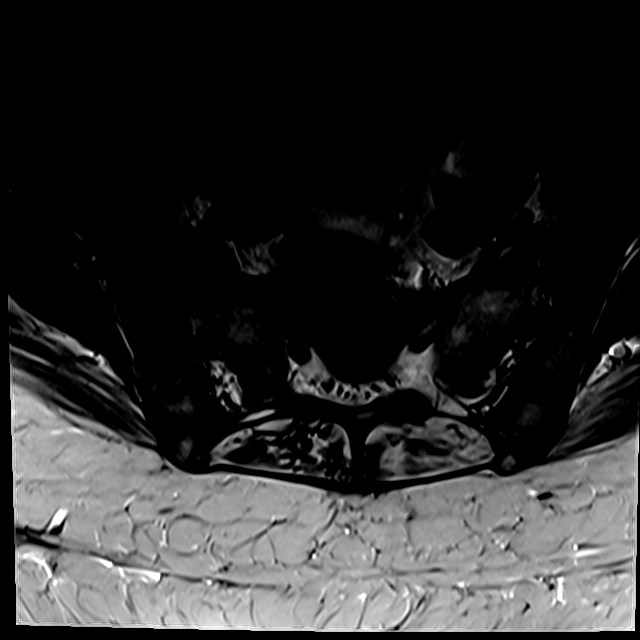
[im 6/42]
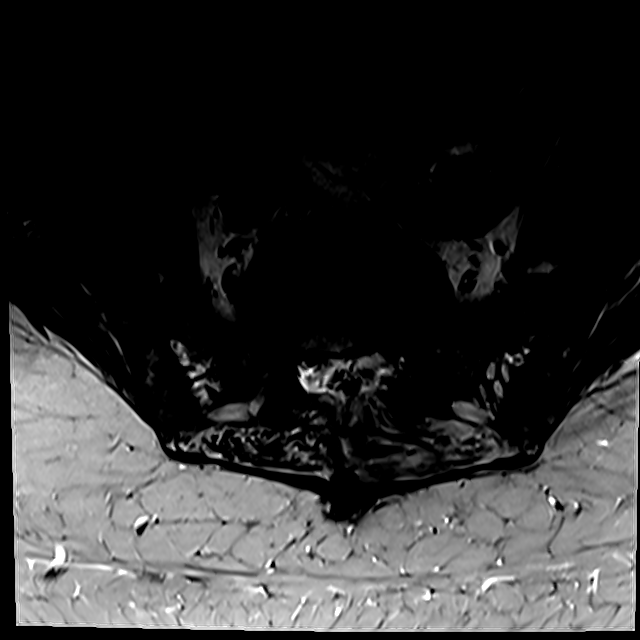
[im 9/42]
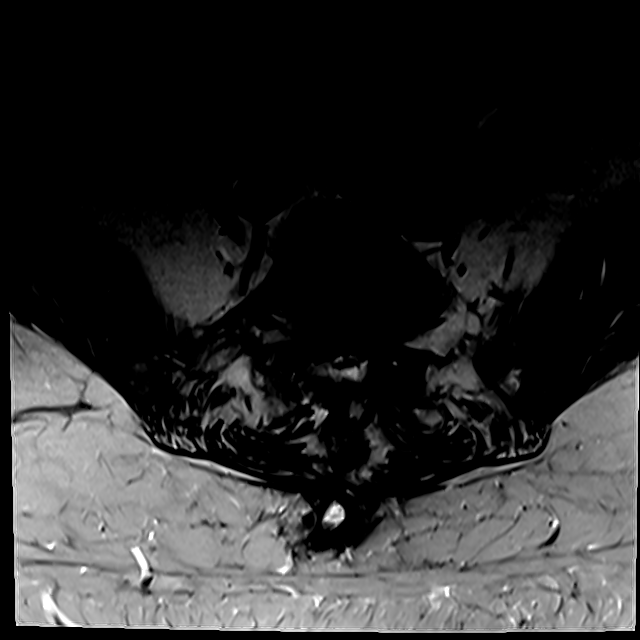
[im 14/42]
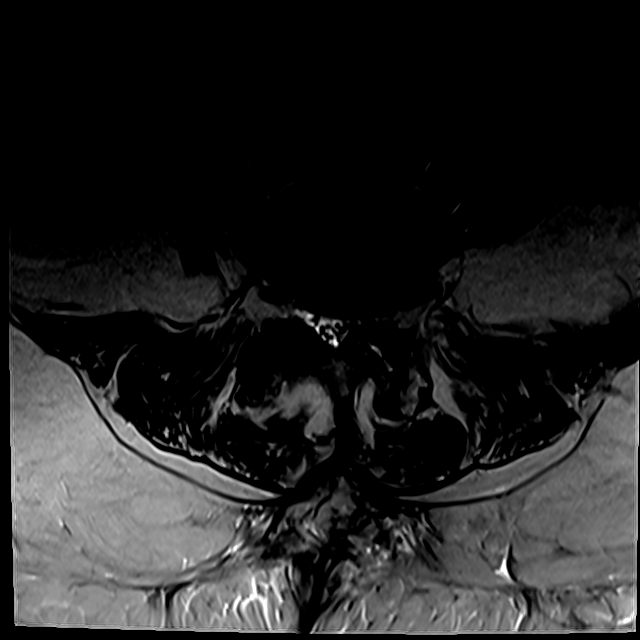
[im 22/42]
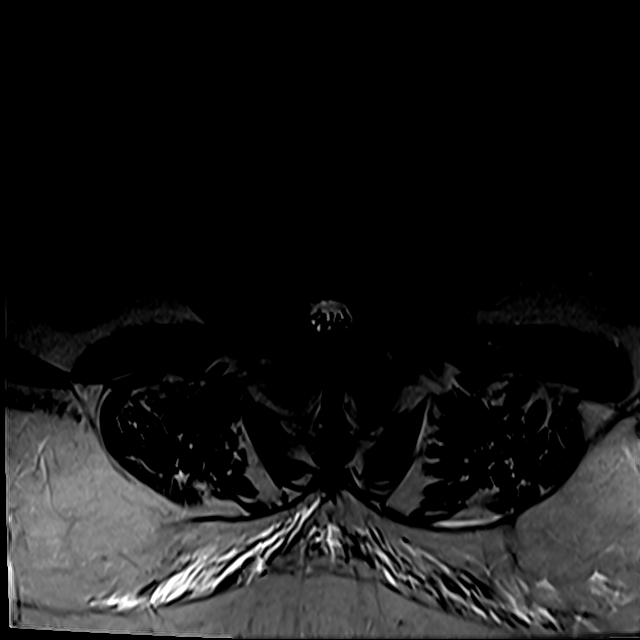
[im 36/42]
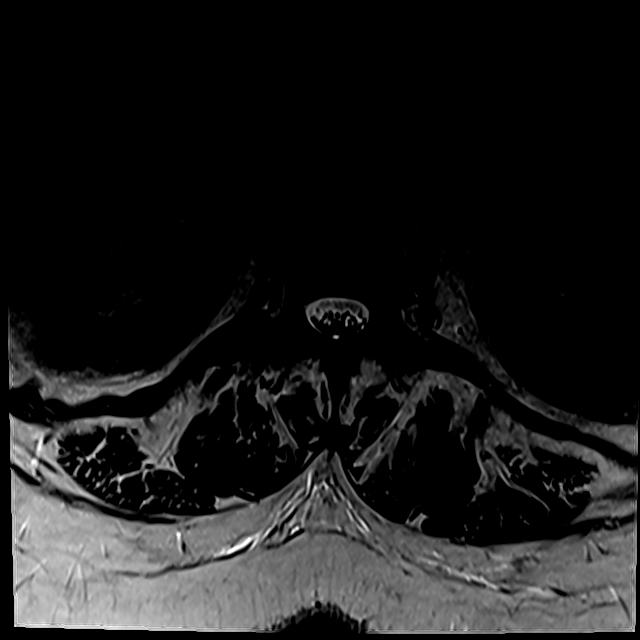

[Series 100: T1 · axial · 4.0mm · 0.28mm/px · z∈[-85,+96]mm · 3 of 42 slices shown (2 of 2)]
[im 6/42]
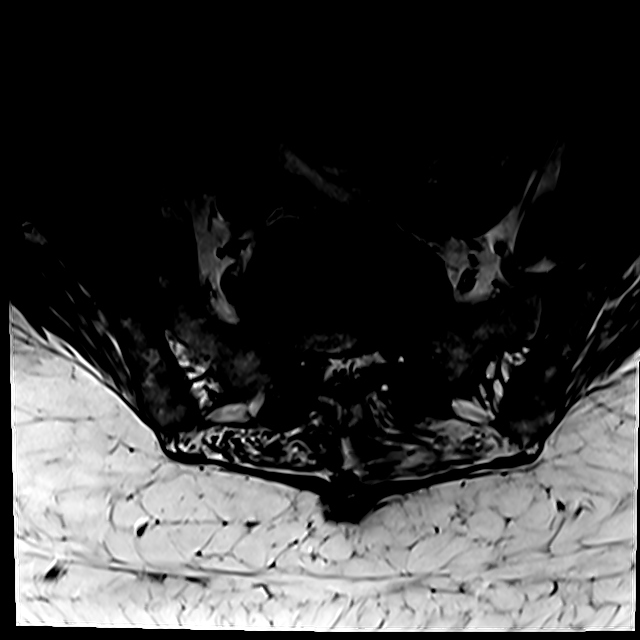
[im 22/42]
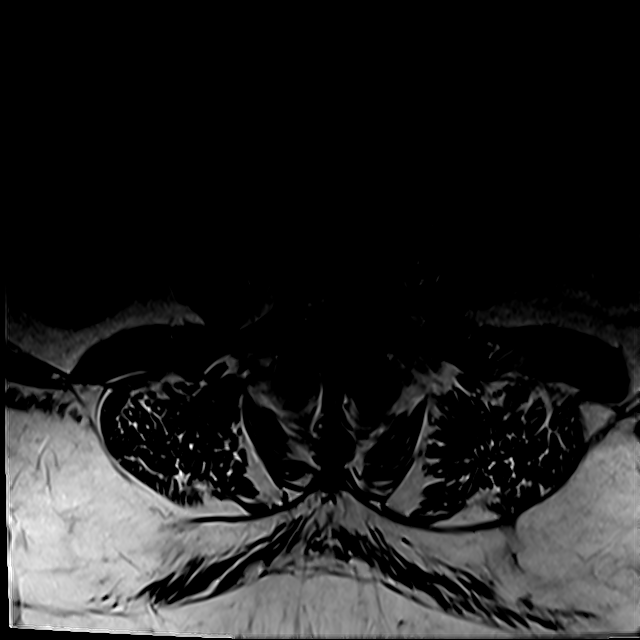
[im 36/42]
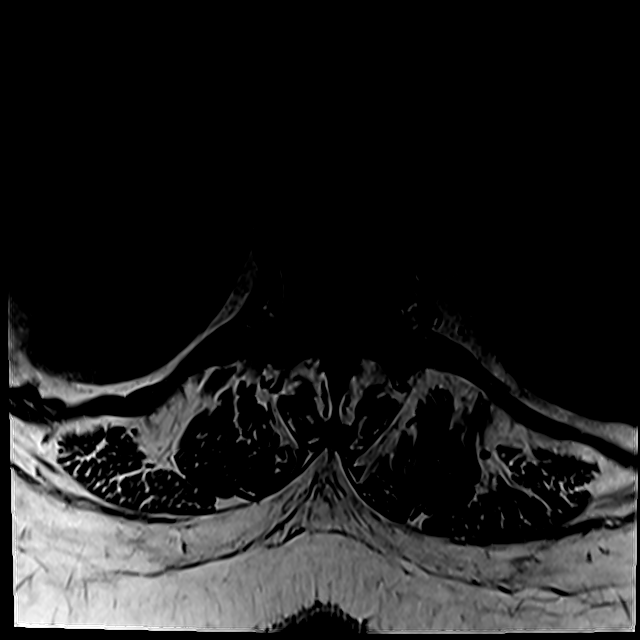

[17 of 48 positions shown; findings below may reference images not displayed]

FINDINGS: Motion artifact is present.

Segmentation: Standard.

Alignment:  Stable with mild anterolisthesis at L4-L5.

Vertebrae: Stable vertebral body heights. Mild marrow edema
associated with the L4-L5 facets on a degenerative basis.

Conus medullaris and cauda equina: Conus extends to the L1-L2 level.
Conus and cauda equina appear normal.

Paraspinal and other soft tissues: New postoperative changes at
L5-S1

Disc levels: There is congenital narrowing of the spinal canal.

L1-L2: Moderate facet arthropathy. No significant canal or foraminal
stenosis.

L2-L3: Mild disc bulge. Moderate facet arthropathy with ligamentum
flavum infolding. No significant canal or foraminal stenosis.

L3-L4: Moderate facet arthropathy with ligamentum flavum infolding.
No significant canal or foraminal stenosis.

L4-L5: Anterolisthesis with uncovering of disc bulge and
superimposed left foraminal extrusion. Marked facet arthropathy.
Mild canal stenosis. Narrowing of the left subarticular recess. Mild
left foraminal stenosis with disc abutting the exiting L4 nerve
root. No right foraminal stenosis.

L5-S1: Interval posterior decompression. Disc bulge with endplate
osteophytic ridging eccentric to the right. Moderate facet
arthropathy. Similar effacement of the thecal sac ventrally.
Decreased narrowing of the subarticular recesses. Mild right
foraminal stenosis. There is improvement of right L5 nerve root
extraforaminal compression. No left foraminal stenosis.
IMPRESSION: Interval postoperative changes at L5-S1. Similar effacement of the
thecal sac ventrally with persistent but decreased narrowing of the
subarticular recesses and improvement of extraforaminal right L5
nerve root compression.

Other levels are similar in appearance. There is a small left
foraminal extrusion at L4-L5 abutting the exiting L4 nerve root.
There is also narrowing of the left subarticular recess at this
level.

## 2020-04-18 ENCOUNTER — Other Ambulatory Visit: Payer: Self-pay | Admitting: *Deleted

## 2020-04-18 DIAGNOSIS — G8929 Other chronic pain: Secondary | ICD-10-CM

## 2020-04-18 MED ORDER — OXYCODONE-ACETAMINOPHEN 5-325 MG PO TABS: ORAL_TABLET | ORAL | 0 refills | Status: DC

## 2020-04-18 NOTE — Telephone Encounter (Signed)
Patient requested refill Epic LR: 03/20/2020 Contract on [employer] [facility] and sent to [place] for approval.

## 2020-05-15 ENCOUNTER — Other Ambulatory Visit: Payer: Self-pay

## 2020-05-15 ENCOUNTER — Telehealth (INDEPENDENT_AMBULATORY_CARE_PROVIDER_SITE_OTHER): Payer: Medicare Other | Admitting: Nurse Practitioner

## 2020-05-15 ENCOUNTER — Encounter: Payer: Self-pay | Admitting: Nurse Practitioner

## 2020-05-15 DIAGNOSIS — J069 Acute upper respiratory infection, unspecified: Secondary | ICD-10-CM

## 2020-05-15 NOTE — Progress Notes (Signed)
Careteam: Patient Care Team: Sharon Seller, NP as PCP - General (Geriatric Medicine)  Advanced Directive information    Allergies  Allergen Reactions  . Aspirin Hives  . Lyrica [Pregabalin]     Nightmares, did not help neuropathy  . Penicillins Hives    Chief Complaint  Patient presents with  . Acute Visit    Patient c/o dark mucous out of nose, sneezing, and watery eyes x 3 days.      HPI: Patient is a 60 y.o. female due to sinus congestion. Dark green discharge from nose.  Reports having chills. 97.3 temp. No fever.  No shortness of breath.  No chest congestion.  No sore throat No loss of taste or smell.  Decrease appetite.  No N/V/D Has had 3 covid vaccines.   Review of Systems:  Review of Systems  Constitutional: Positive for chills and malaise/fatigue. Negative for fever.  HENT: Positive for congestion. Negative for ear pain, sinus pain and sore throat.   Respiratory: Positive for cough. Negative for sputum production, shortness of breath and wheezing.   Cardiovascular: Negative for chest pain and leg swelling.    Past Medical History:  Diagnosis Date  . Acute bronchitis   . Cervical high risk HPV (human papillomavirus) test positive 12/2017   Negative subtype 16, 18/45  . Chronic back pain   . Complication of anesthesia    "didn't wake up"  . Cough   . Insomnia, unspecified   . Other malaise and fatigue   . Other specified diseases of blood and blood-forming organs(289.89)   . Tobacco use disorder    Past Surgical History:  Procedure Laterality Date  . ESOPHAGOGASTRODUODENOSCOPY N/A 03/17/2013   Procedure: ESOPHAGOGASTRODUODENOSCOPY (EGD);  Surgeon: Meryl Dare, MD;  Location: [facility] ENDOSCOPY;  Service: Endoscopy;  Laterality: N/A;  . FRACTURE SURGERY    . Laminectomy and foraminotomy at L5-S1    12/12/2019  . TONSILLECTOMY     Social History:   reports that she has been smoking cigarettes. She has a 6.00 pack-year smoking history. She  has never used smokeless tobacco. She reports previous alcohol use. She reports that she does not use drugs.  Family History  Problem Relation Age of Onset  . Arthritis Mother   . Hypertension Sister   . Hypertension Brother   . Hypertension Sister   . Hypertension Sister   . Hypertension Sister     Medications: Patient's Medications  New Prescriptions   No medications on file  Previous Medications   ALBUTEROL (VENTOLIN HFA) 108 (90 BASE) MCG/ACT INHALER    INHALE 2 PUFFS INTO THE LUNGS EVERY 6 HOURS AS NEEDED FOR WHEEZING OR SHORTNESS OF BREATH   CLINDAMYCIN (CLEOCIN) 150 MG CAPSULE    Take 300 mg by mouth every 8 (eight) hours.   CYCLOBENZAPRINE (FLEXERIL) 10 MG TABLET    Take 10 mg by mouth 3 (three) times daily.   DIPHENHYDRAMINE (BENADRYL) 25 MG TABLET    Take 25 mg by mouth as needed.   FOLIC ACID (FOLVITE) 1 MG TABLET    Take 2 tablets (2 mg total) by mouth daily.   LOSARTAN (COZAAR) 25 MG TABLET    TAKE 1 TABLET(25 MG) BY MOUTH DAILY   OXYCODONE-ACETAMINOPHEN (PERCOCET/ROXICET) 5-325 MG TABLET    Take one tablet by mouth every 8-12 hours as needed for pain.  Modified Medications   No medications on file  Discontinued Medications   No medications on file    Physical Exam:  There were  no vitals filed for this visit. There is no height or weight on file to calculate BMI. Wt Readings from Last 3 Encounters:  02/07/20 205 lb (93 kg)  10/25/19 217 lb (98.4 kg)  04/17/19 217 lb (98.4 kg)      Labs reviewed: Basic Metabolic Panel: Recent Labs    02/07/20 1228  NA 135  K 3.4*  CL 94*  CO2 27  GLUCOSE 98  BUN 10  CREATININE 0.49*  CALCIUM 9.3   Liver Function Tests: Recent Labs    02/07/20 1228  AST 41*  ALT 20  BILITOT 2.5*  PROT 6.8   No results for input(s): LIPASE, AMYLASE in the last 8760 hours. No results for input(s): AMMONIA in the last 8760 hours. CBC: Recent Labs    02/07/20 1228 02/15/20 0928  WBC 3.5* 3.7*  NEUTROABS 2,622 1,950  HGB  11.1* 11.5*  HCT 30.6* 31.6*  MCV 120.5* 120.2*  PLT 117* 233   Lipid Panel: No results for input(s): CHOL, HDL, LDLCALC, TRIG, CHOLHDL, LDLDIRECT in the last 8760 hours. TSH: No results for input(s): TSH in the last 8760 hours. A1C: Lab Results  Component Value Date   HGBA1C 4.8 07/01/2015     Assessment/Plan 1. Viral upper respiratory tract infection Plain nasal saline spray throughout the day as needed May use tylenol 325 mg 2 tablets every 6 hours as needed aches and pains or sore throat- MAX tylenol 3000 mg in 24 hours from all sources humidifier in the home to help with the dry air Mucinex DM by mouth twice daily as needed for cough and congestion with full glass of water  Keep well hydrated Avoid forcefully blowing nose - SARS-COV-2 RNA,(COVID-19) QUAL NAAT Strict return precautions given [place] K. Biagio Borg  [facility] [phone]    Virtual Visit via Earleen Reaper  I connected with patient on 05/15/20 at  9:30 AM EST by video and verified that I am speaking with the correct person using two identifiers.  Location: Patient: home Provider: [facility]   I discussed the limitations, risks, security and privacy concerns of performing an evaluation and management service by telephone and the availability of in person appointments. I also discussed with the patient that there may be a patient responsible charge related to this service. The patient expressed understanding and agreed to proceed.   I discussed the assessment and treatment plan with the patient. The patient was provided an opportunity to ask questions and all were answered. The patient agreed with the plan and demonstrated an understanding of the instructions.   The patient was advised to call back or seek an in-person evaluation if the symptoms worsen or if the condition fails to improve as anticipated.  I provided 15 minutes of non-face-to-face time during this encounter.  Janene Harvey. Biagio Borg Avs printed and mailed

## 2020-05-15 NOTE — Patient Instructions (Signed)
Plain nasal saline spray throughout the day as needed Wash out sinuses good twice daily May use tylenol 325 mg 2 tablets every 6 hours as needed aches and pains or sore throat- MAX tylenol 3000 mg in 24 hours from all sources- there is 325 mg of tylenol in every pain pill (oxycodone) you take  humidifier in the home to help with the dry air Mucinex DM by mouth twice daily as needed for cough and congestion with full glass of water  Keep well hydrated Avoid forcefully blowing nose  We are testing for COVID today quarantine until results come back OR it has been 5 days since symptoms started and they have completely resolved Wear a MASK if you go anywhere for 5 additional days.    Sinusitis, Adult Sinusitis is inflammation of your sinuses. Sinuses are hollow spaces in the bones around your face. Your sinuses are located:  Around your eyes.  In the middle of your forehead.  Behind your nose.  In your cheekbones. Mucus normally drains out of your sinuses. When your nasal tissues become inflamed or swollen, mucus can become trapped or blocked. This allows bacteria, viruses, and fungi to grow, which leads to infection. Most infections of the sinuses are caused by a virus. Sinusitis can develop quickly. It can last for up to 4 weeks (acute) or for more than 12 weeks (chronic). Sinusitis often develops after a cold. What are the causes? This condition is caused by anything that creates swelling in the sinuses or stops mucus from draining. This includes:  Allergies.  Asthma.  Infection from bacteria or viruses.  Deformities or blockages in your nose or sinuses.  Abnormal growths in the nose (nasal polyps).  Pollutants, such as chemicals or irritants in the air.  Infection from fungi (rare). What increases the risk? You are more likely to develop this condition if you:  Have a weak body defense system (immune system).  Do a lot of swimming or diving.  Overuse nasal  sprays.  Smoke. What are the signs or symptoms? The main symptoms of this condition are pain and a feeling of pressure around the affected sinuses. Other symptoms include:  Stuffy nose or congestion.  Thick drainage from your nose.  Swelling and warmth over the affected sinuses.  Headache.  Upper toothache.  A cough that may get worse at night.  Extra mucus that collects in the throat or the back of the nose (postnasal drip).  Decreased sense of smell and taste.  Fatigue.  A fever.  Sore throat.  Bad breath. How is this diagnosed? This condition is diagnosed based on:  Your symptoms.  Your medical history.  A physical exam.  Tests to find out if your condition is acute or chronic. This may include: ? Checking your nose for nasal polyps. ? Viewing your sinuses using a device that has a light (endoscope). ? Testing for allergies or bacteria. ? Imaging tests, such as an MRI or CT scan. In rare cases, a bone biopsy may be done to rule out more serious types of fungal sinus disease. How is this treated? Treatment for sinusitis depends on the cause and whether your condition is chronic or acute.  If caused by a virus, your symptoms should go away on their own within 10 days. You may be given medicines to relieve symptoms. They include: ? Medicines that shrink swollen nasal passages (topical intranasal decongestants). ? Medicines that treat allergies (antihistamines). ? A spray that eases inflammation of the nostrils (topical  intranasal corticosteroids). ? Rinses that help get rid of thick mucus in your nose (nasal saline washes).  If caused by bacteria, your health care provider may recommend waiting to see if your symptoms improve. Most bacterial infections will get better without antibiotic medicine. You may be given antibiotics if you have: ? A severe infection. ? A weak immune system.  If caused by narrow nasal passages or nasal polyps, you may need to have  surgery. Follow these instructions at home: Medicines  Take, use, or apply over-the-counter and prescription medicines only as told by your health care provider. These may include nasal sprays.  If you were prescribed an antibiotic medicine, take it as told by your health care provider. Do not stop taking the antibiotic even if you start to feel better. Hydrate and humidify  Drink enough fluid to keep your urine pale yellow. Staying hydrated will help to thin your mucus.  Use a cool mist humidifier to keep the humidity level in your home above 50%.  Inhale steam for 10-15 minutes, 3-4 times a day, or as told by your health care provider. You can do this in the bathroom while a hot shower is running.  Limit your exposure to cool or dry air.   Rest  Rest as much as possible.  Sleep with your head raised (elevated).  Make sure you get enough sleep each night. General instructions  Apply a warm, moist washcloth to your face 3-4 times a day or as told by your health care provider. This will help with discomfort.  Wash your hands often with soap and water to reduce your exposure to germs. If soap and water are not available, use hand sanitizer.  Do not smoke. Avoid being around people who are smoking (secondhand smoke).  Keep all follow-up visits as told by your health care provider. This is important.   Contact a health care provider if:  You have a fever.  Your symptoms get worse.  Your symptoms do not improve within 10 days. Get help right away if:  You have a severe headache.  You have persistent vomiting.  You have severe pain or swelling around your face or eyes.  You have vision problems.  You develop confusion.  Your neck is stiff.  You have trouble breathing. Summary  Sinusitis is soreness and inflammation of your sinuses. Sinuses are hollow spaces in the bones around your face.  This condition is caused by nasal tissues that become inflamed or swollen.  The swelling traps or blocks the flow of mucus. This allows bacteria, viruses, and fungi to grow, which leads to infection.  If you were prescribed an antibiotic medicine, take it as told by your health care provider. Do not stop taking the antibiotic even if you start to feel better.  Keep all follow-up visits as told by your health care provider. This is important. This information is not intended to replace advice given to you by your health care provider. Make sure you discuss any questions you have with your health care provider. Document Revised: 09/06/2017 Document Reviewed: 09/06/2017 Elsevier Patient Education  2021 [employer].

## 2020-05-15 NOTE — Progress Notes (Signed)
   This service is provided via telemedicine  No vital signs collected/recorded due to the encounter was a telemedicine visit.   Location of patient (ex: home, work):  Home  Patient consents to a telephone visit: Yes, see telephone visit dated 09/25/2019  Location of the provider (ex: office, home):  [facility], Office   Name of any referring provider:  N/A  Names of all persons participating in the telemedicine service and their role in the encounter:  [name], Abbey Chatters, NP, and Patient   Time spent on call:  5 min with medical assistant

## 2020-05-17 LAB — SARS-COV-2 RNA,(COVID-19) QUALITATIVE NAAT: SARS CoV2 RNA: NOT DETECTED

## 2020-05-21 ENCOUNTER — Other Ambulatory Visit: Payer: Self-pay | Admitting: *Deleted

## 2020-05-21 DIAGNOSIS — G8929 Other chronic pain: Secondary | ICD-10-CM

## 2020-05-21 DIAGNOSIS — M5441 Lumbago with sciatica, right side: Secondary | ICD-10-CM

## 2020-05-21 MED ORDER — OXYCODONE-ACETAMINOPHEN 5-325 MG PO TABS: ORAL_TABLET | ORAL | 0 refills | Status: DC

## 2020-05-21 NOTE — Telephone Encounter (Signed)
Patient requested refill Epic LR: 04/18/2020 Contract needs updating. Note has already been added to upcoming appointment Pended Rx and sent to [facility] for approval.

## 2020-06-18 ENCOUNTER — Other Ambulatory Visit: Payer: Self-pay | Admitting: *Deleted

## 2020-06-18 DIAGNOSIS — G8929 Other chronic pain: Secondary | ICD-10-CM

## 2020-06-18 NOTE — Telephone Encounter (Signed)
Patient requested refill.  Contract needs updated Epic LR: 05/21/2020 Pended Rx and sent to [facility] for approval.

## 2020-06-19 MED ORDER — OXYCODONE-ACETAMINOPHEN 5-325 MG PO TABS: ORAL_TABLET | ORAL | 0 refills | Status: DC

## 2020-07-19 ENCOUNTER — Other Ambulatory Visit: Payer: Self-pay

## 2020-07-19 DIAGNOSIS — G8929 Other chronic pain: Secondary | ICD-10-CM

## 2020-07-19 MED ORDER — OXYCODONE-ACETAMINOPHEN 5-325 MG PO TABS: ORAL_TABLET | ORAL | 0 refills | Status: DC

## 2020-07-19 NOTE — Telephone Encounter (Signed)
Oxycodone last refilled on 06/19/2020  Treatment agreement on file from 04/17/2019

## 2020-07-30 ENCOUNTER — Encounter: Payer: Self-pay | Admitting: Nurse Practitioner

## 2020-07-31 ENCOUNTER — Telehealth: Payer: Medicare Other

## 2020-07-31 NOTE — Telephone Encounter (Signed)
Form was placed on the clinical intake desk for completion of Disability parking placard.  Form was completed and placed in Sharon Seller, NP review and sign folder for further review

## 2020-08-01 NOTE — Telephone Encounter (Signed)
Awaiting reply from Eubanks, Jessica K, NP  

## 2020-08-05 NOTE — Telephone Encounter (Signed)
Patient was called and informed that form was complete and ready to pick up.

## 2020-08-05 NOTE — Telephone Encounter (Signed)
Completed and given to Evie in triage.

## 2020-08-19 ENCOUNTER — Other Ambulatory Visit: Payer: Self-pay | Admitting: *Deleted

## 2020-08-19 DIAGNOSIS — G8929 Other chronic pain: Secondary | ICD-10-CM

## 2020-08-19 MED ORDER — OXYCODONE-ACETAMINOPHEN 5-325 MG PO TABS: ORAL_TABLET | ORAL | 0 refills | Status: DC

## 2020-08-19 NOTE — Telephone Encounter (Signed)
Patient requested refill Epic LR: 07/19/2020 Contract needs updating.  Pended Rx and sent to [facility] for approval.

## 2020-08-23 ENCOUNTER — Telehealth: Payer: Self-pay

## 2020-08-23 NOTE — Telephone Encounter (Signed)
Called patient and informed them about their negative fecal occult blood test.  Results sent to scanning.

## 2020-09-20 ENCOUNTER — Other Ambulatory Visit: Payer: Self-pay | Admitting: *Deleted

## 2020-09-20 DIAGNOSIS — G8929 Other chronic pain: Secondary | ICD-10-CM

## 2020-09-20 MED ORDER — OXYCODONE-ACETAMINOPHEN 5-325 MG PO TABS: ORAL_TABLET | ORAL | 0 refills | Status: DC

## 2020-09-20 NOTE — Telephone Encounter (Signed)
Patient called requesting refill on medication Epic LR: 08/19/2020 Contract needs updated, added note to upcoming appointment Pended Rx and sent to [facility] for approval.

## 2020-10-17 ENCOUNTER — Other Ambulatory Visit: Payer: Self-pay

## 2020-10-17 ENCOUNTER — Telehealth: Payer: Self-pay

## 2020-10-17 ENCOUNTER — Ambulatory Visit (INDEPENDENT_AMBULATORY_CARE_PROVIDER_SITE_OTHER): Payer: Medicare Other | Admitting: Nurse Practitioner

## 2020-10-17 ENCOUNTER — Encounter: Payer: Self-pay | Admitting: Nurse Practitioner

## 2020-10-17 DIAGNOSIS — Z0000 Encounter for general adult medical examination without abnormal findings: Secondary | ICD-10-CM | POA: Diagnosis not present

## 2020-10-17 NOTE — Patient Instructions (Signed)
Laura [name] , Thank you for taking time to come for your Medicare Wellness Visit. I appreciate your ongoing commitment to your health goals. Please review the following plan we discussed and let me know if I can assist you in the future.   Screening recommendations/referrals: Colonoscopy -to complete cologuard Mammogram up to date Recommended yearly ophthalmology/optometry visit for glaucoma screening and checkup Recommended yearly dental visit for hygiene and checkup  Vaccinations: Influenza vaccine up to date Pneumococcal vaccine up to date Tdap vaccine up to date Shingles vaccine RECOMMENDED To get at local pharmacy    Advanced directives: recommended to complete.  Conditions/risks identified: smoking, obesity  Next appointment: 1 year for AWV   Preventive Care 58 Years and Older, Female Preventive care refers to lifestyle choices and visits with your health care provider that can promote health and wellness. What does preventive care include? A yearly physical exam. This is also called an annual well check. Dental exams once or twice a year. Routine eye exams. Ask your health care provider how often you should have your eyes checked. Personal lifestyle choices, including: Daily care of your teeth and gums. Regular physical activity. Eating a healthy diet. Avoiding tobacco and drug use. Limiting alcohol use. Practicing safe sex. Taking low-dose aspirin every day. Taking vitamin and mineral supplements as recommended by your health care provider. What happens during an annual well check? The services and screenings done by your health care provider during your annual well check will depend on your age, overall health, lifestyle risk factors, and family history of disease. Counseling  Your health care provider may ask you questions about your: Alcohol use. Tobacco use. Drug use. Emotional well-being. Home and relationship well-being. Sexual activity. Eating  habits. History of falls. Memory and ability to understand (cognition). Work and work Astronomer. Reproductive health. Screening  You may have the following tests or measurements: Height, weight, and BMI. Blood pressure. Lipid and cholesterol levels. These may be checked every 5 years, or more frequently if you are over 32 years old. Skin check. Lung cancer screening. You may have this screening every year starting at age 58 if you have a 30-pack-year history of smoking and currently smoke or have quit within the past 15 years. Fecal occult blood test (FOBT) of the stool. You may have this test every year starting at age 58. Flexible sigmoidoscopy or colonoscopy. You may have a sigmoidoscopy every 5 years or a colonoscopy every 10 years starting at age 58. Hepatitis C blood test. Hepatitis B blood test. Sexually transmitted disease (STD) testing. Diabetes screening. This is done by checking your blood sugar (glucose) after you have not eaten for a while (fasting). You may have this done every 1-3 years. Bone density scan. This is done to screen for osteoporosis. You may have this done starting at age 58. Mammogram. This may be done every 1-2 years. Talk to your health care provider about how often you should have regular mammograms. Talk with your health care provider about your test results, treatment options, and if necessary, the need for more tests. Vaccines  Your health care provider may recommend certain vaccines, such as: Influenza vaccine. This is recommended every year. Tetanus, diphtheria, and acellular pertussis (Tdap, Td) vaccine. You may need a Td booster every 10 years. Zoster vaccine. You may need this after age 58. Pneumococcal 13-valent conjugate (PCV13) vaccine. One dose is recommended after age 58. Pneumococcal polysaccharide (PPSV23) vaccine. One dose is recommended after age 58. Talk to your health care  provider about which screenings and vaccines you need and how  often you need them. This information is not intended to replace advice given to you by your health care provider. Make sure you discuss any questions you have with your health care provider. Document Released: 05/03/2015 Document Revised: 12/25/2015 Document Reviewed: 02/05/2015 Elsevier Interactive Patient Education  2017 [employer] Prevention in the Home Falls can cause injuries. They can happen to people of all ages. There are many things you can do to make your home safe and to help prevent falls. What can I do on the outside of my home? Regularly fix the edges of walkways and driveways and fix any cracks. Remove anything that might make you trip as you walk through a door, such as a raised step or threshold. Trim any bushes or trees on the path to your home. Use bright outdoor lighting. Clear any walking paths of anything that might make someone trip, such as rocks or tools. Regularly check to see if handrails are loose or broken. Make sure that both sides of any steps have handrails. Any raised decks and porches should have guardrails on the edges. Have any leaves, snow, or ice cleared regularly. Use sand or salt on walking paths during winter. Clean up any spills in your garage right away. This includes oil or grease spills. What can I do in the bathroom? Use night lights. Install grab bars by the toilet and in the tub and shower. Do not use towel bars as grab bars. Use non-skid mats or decals in the tub or shower. If you need to sit down in the shower, use a plastic, non-slip stool. Keep the floor dry. Clean up any water that spills on the floor as soon as it happens. Remove soap buildup in the tub or shower regularly. Attach bath mats securely with double-sided non-slip rug tape. Do not have throw rugs and other things on the floor that can make you trip. What can I do in the bedroom? Use night lights. Make sure that you have a light by your bed that is easy to  reach. Do not use any sheets or blankets that are too big for your bed. They should not hang down onto the floor. Have a firm chair that has side arms. You can use this for support while you get dressed. Do not have throw rugs and other things on the floor that can make you trip. What can I do in the kitchen? Clean up any spills right away. Avoid walking on wet floors. Keep items that you use a lot in easy-to-reach places. If you need to reach something above you, use a strong step stool that has a grab bar. Keep electrical cords out of the way. Do not use floor polish or wax that makes floors slippery. If you must use wax, use non-skid floor wax. Do not have throw rugs and other things on the floor that can make you trip. What can I do with my stairs? Do not leave any items on the stairs. Make sure that there are handrails on both sides of the stairs and use them. Fix handrails that are broken or loose. Make sure that handrails are as long as the stairways. Check any carpeting to make sure that it is firmly attached to the stairs. Fix any carpet that is loose or worn. Avoid having throw rugs at the top or bottom of the stairs. If you do have throw rugs, attach them to the  floor with carpet tape. Make sure that you have a light switch at the top of the stairs and the bottom of the stairs. If you do not have them, ask someone to add them for you. What else can I do to help prevent falls? Wear shoes that: Do not have high heels. Have rubber bottoms. Are comfortable and fit you well. Are closed at the toe. Do not wear sandals. If you use a stepladder: Make sure that it is fully opened. Do not climb a closed stepladder. Make sure that both sides of the stepladder are locked into place. Ask someone to hold it for you, if possible. Clearly mark and make sure that you can see: Any grab bars or handrails. First and last steps. Where the edge of each step is. Use tools that help you move  around (mobility aids) if they are needed. These include: Canes. Walkers. Scooters. Crutches. Turn on the lights when you go into a dark area. Replace any light bulbs as soon as they burn out. Set up your furniture so you have a clear path. Avoid moving your furniture around. If any of your floors are uneven, fix them. If there are any pets around you, be aware of where they are. Review your medicines with your doctor. Some medicines can make you feel dizzy. This can increase your chance of falling. Ask your doctor what other things that you can do to help prevent falls. This information is not intended to replace advice given to you by your health care provider. Make sure you discuss any questions you have with your health care provider. Document Released: 01/31/2009 Document Revised: 09/12/2015 Document Reviewed: 05/11/2014 Elsevier Interactive Patient Education  2017 [employer].

## 2020-10-17 NOTE — Progress Notes (Signed)
This service is provided via telemedicine  No vital signs collected/recorded due to the encounter was a telemedicine visit.   Location of patient (ex: home, work):  Home  Patient consents to a telephone visit:  Yes, see encounter dated 10/17/2020  Location of the provider (ex: office, home):  [facility]  Name of any referring provider:  N/A  Names of all persons participating in the telemedicine service and their role in the encounter:  Abbey Chatters, Nurse Practitioner, Elveria Royals, CMA, and patient.   Time sN/Apent on call:  13 minutes with medical assistant
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
## 2020-10-17 NOTE — Telephone Encounter (Signed)
Ms. halana, deisher are scheduled for a virtual visit with your provider today.    Just as we do with appointments in the office, we must obtain your consent to participate.  Your consent will be active for this visit and any virtual visit you may have with one of our providers in the next 365 days.    If you have a MyChart account, I can also send a copy of this consent to you electronically.  All virtual visits are billed to your insurance company just like a traditional visit in the office.  As this is a virtual visit, video technology does not allow for your provider to perform a traditional examination.  This may limit your provider's ability to fully assess your condition.  If your provider identifies any concerns that need to be evaluated in person or the need to arrange testing such as labs, EKG, etc, we will make arrangements to do so.    Although advances in technology are sophisticated, we cannot ensure that it will always work on either your end or our end.  If the connection with a video visit is poor, we may have to switch to a telephone visit.  With either a video or telephone visit, we are not always able to ensure that we have a secure connection.   I need to obtain your verbal consent now.   Are you willing to proceed with your visit today?   Tiffanye Hartmann has provided verbal consent on 10/17/2020 for a virtual visit (video or telephone).   Elveria Royals, [facility] 10/17/2020  2:47 PM

## 2020-10-18 ENCOUNTER — Other Ambulatory Visit: Payer: Self-pay

## 2020-10-18 DIAGNOSIS — M5441 Lumbago with sciatica, right side: Secondary | ICD-10-CM

## 2020-10-18 DIAGNOSIS — G8929 Other chronic pain: Secondary | ICD-10-CM

## 2020-10-18 MED ORDER — OXYCODONE-ACETAMINOPHEN 5-325 MG PO TABS: ORAL_TABLET | ORAL | 0 refills | Status: DC

## 2020-10-18 NOTE — Telephone Encounter (Signed)
Patient would like refill on pain medication. Oxycodone 5/325 mg One tablet every 8-12 hours as needed for pain. Patient does not have updated opioid agreement. Last refill 09/20/2020.  Medication pended and sent to Abbey Chatters, NP for approval

## 2020-10-31 ENCOUNTER — Other Ambulatory Visit: Payer: Self-pay | Admitting: Nurse Practitioner

## 2020-10-31 DIAGNOSIS — Z1231 Encounter for screening mammogram for malignant neoplasm of breast: Secondary | ICD-10-CM

## 2020-11-18 ENCOUNTER — Other Ambulatory Visit: Payer: Self-pay | Admitting: *Deleted

## 2020-11-18 DIAGNOSIS — G8929 Other chronic pain: Secondary | ICD-10-CM

## 2020-11-18 MED ORDER — OXYCODONE-ACETAMINOPHEN 5-325 MG PO TABS: ORAL_TABLET | ORAL | 0 refills | Status: DC

## 2020-11-18 NOTE — Telephone Encounter (Signed)
Patient requested refill  Epic LR:10/18/2020 Contract needs to be updated added note to upcoming appointment.  Pended Rx and sent to [facility] for approval.

## 2020-11-20 ENCOUNTER — Ambulatory Visit: Payer: Medicare Other | Admitting: Nurse Practitioner

## 2020-11-21 DIAGNOSIS — Z1212 Encounter for screening for malignant neoplasm of rectum: Secondary | ICD-10-CM | POA: Diagnosis not present

## 2020-11-21 DIAGNOSIS — Z1211 Encounter for screening for malignant neoplasm of colon: Secondary | ICD-10-CM | POA: Diagnosis not present

## 2020-11-28 ENCOUNTER — Telehealth: Payer: Self-pay | Admitting: Nurse Practitioner

## 2020-11-28 NOTE — Telephone Encounter (Signed)
Patient called and notified.

## 2020-11-28 NOTE — Telephone Encounter (Signed)
Please notify pt that results were negative which is a normal colorectal screening. Will follow up in 3 years.

## 2020-11-29 ENCOUNTER — Ambulatory Visit: Payer: Medicare Other | Admitting: Nurse Practitioner

## 2020-12-04 ENCOUNTER — Ambulatory Visit: Payer: Medicare Other | Admitting: Nurse Practitioner

## 2020-12-19 MED ORDER — OXYCODONE-ACETAMINOPHEN 5-325 MG PO TABS: ORAL_TABLET | ORAL | 0 refills | Status: DC

## 2020-12-19 NOTE — Telephone Encounter (Signed)
Incoming call received from patient stating he was trying to reach [place] before she sent message to Shanda Bumps, as he is in [place] caring for a fmaily member with a broken leg and needs rx sent to the [facility] in [place].  I confirmed the location and added it to the patients pharmacy contact list.  Patient aware that [facility].    [facility], spoke with Romeo Apple and canceled rx approval sent at 1:50 by Sharon Seller, NP for Oxycodone.

## 2020-12-19 NOTE — Addendum Note (Signed)
Addended by: Maurice Small on: 12/19/2020 02:18 PM   Modules accepted: Orders

## 2020-12-25 ENCOUNTER — Other Ambulatory Visit: Payer: Self-pay

## 2020-12-25 ENCOUNTER — Ambulatory Visit
Admission: RE | Admit: 2020-12-25 | Discharge: 2020-12-25 | Disposition: A | Discharge status: Home / Self Care | Payer: Medicare Other | Source: Ambulatory Visit | Attending: Nurse Practitioner | Admitting: Nurse Practitioner

## 2020-12-25 DIAGNOSIS — Z1231 Encounter for screening mammogram for malignant neoplasm of breast: Secondary | ICD-10-CM | POA: Diagnosis not present

## 2020-12-25 IMAGING — MG MM DIGITAL SCREENING BILAT W/ TOMO AND CAD
8 series · 8 of 24 positions shown · non-contrast
Comparison: Previous exam(s).

ACR Breast Density Category a: The breast tissue is almost entirely
fatty.

CLINICAL DATA: Screening.

EXAM:
DIGITAL SCREENING BILATERAL MAMMOGRAM WITH TOMOSYNTHESIS AND CAD
TECHNIQUE: Bilateral screening digital craniocaudal and mediolateral oblique
mammograms were obtained. Bilateral screening digital breast
tomosynthesis was performed. The images were evaluated with
computer-aided detection.

[R CC synth-2D]
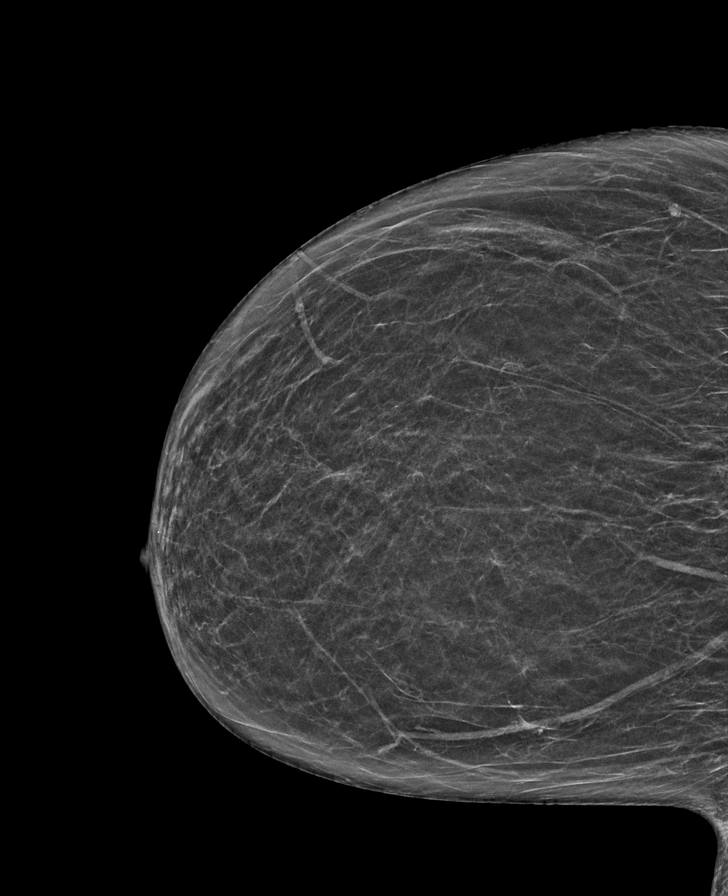

[R MLO synth-2D]
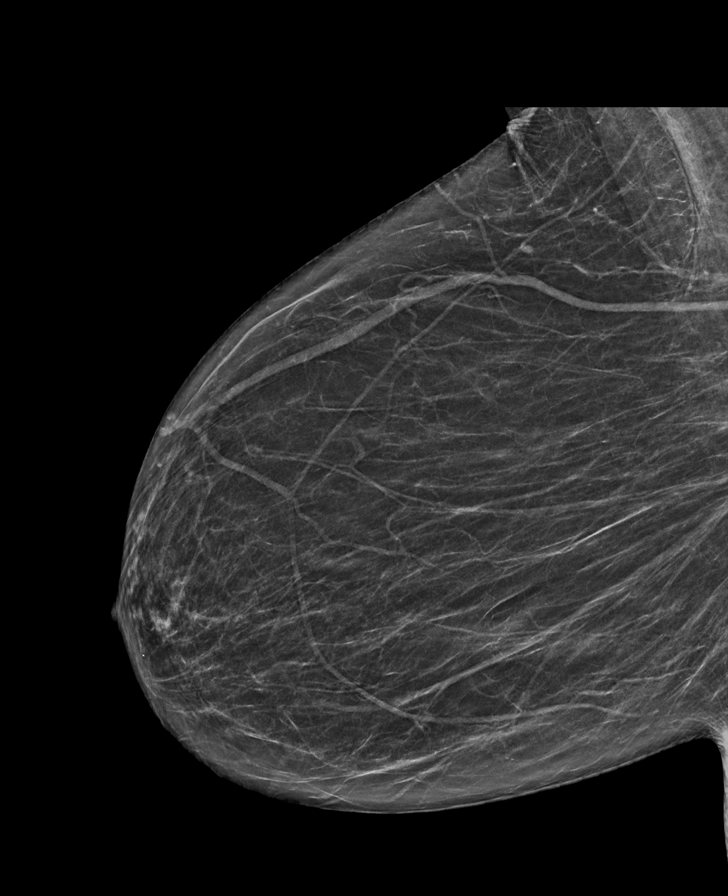

[L CC synth-2D]
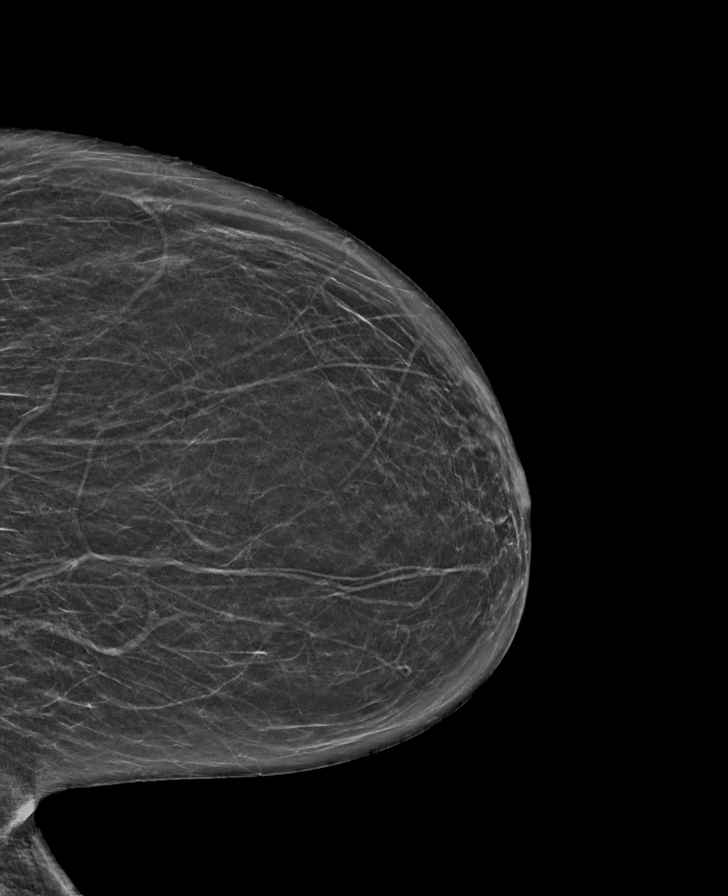

[L MLO synth-2D]
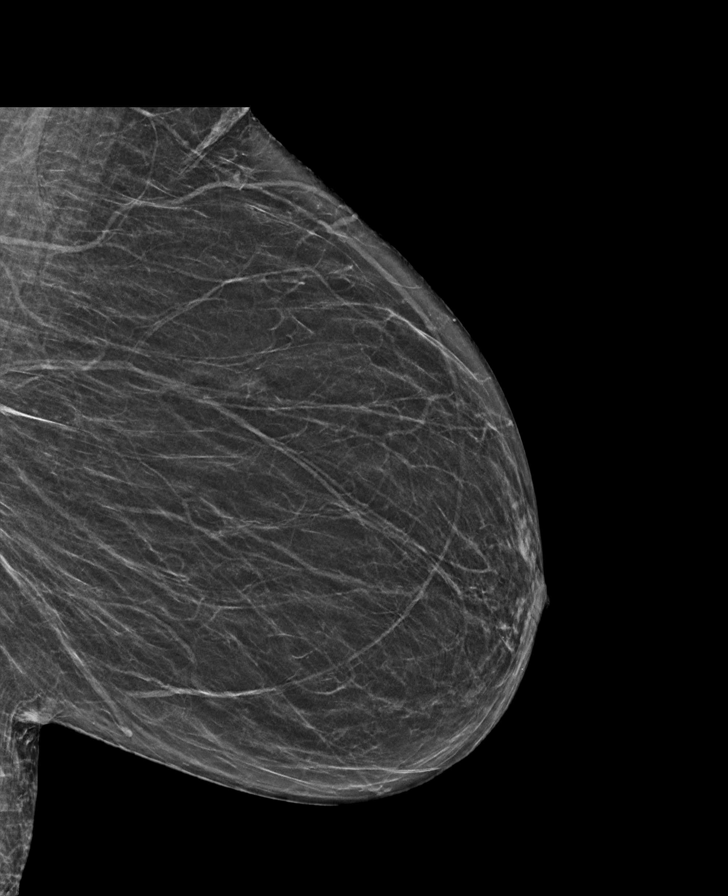

[R CC tomo · tomo slice 27/53.0]
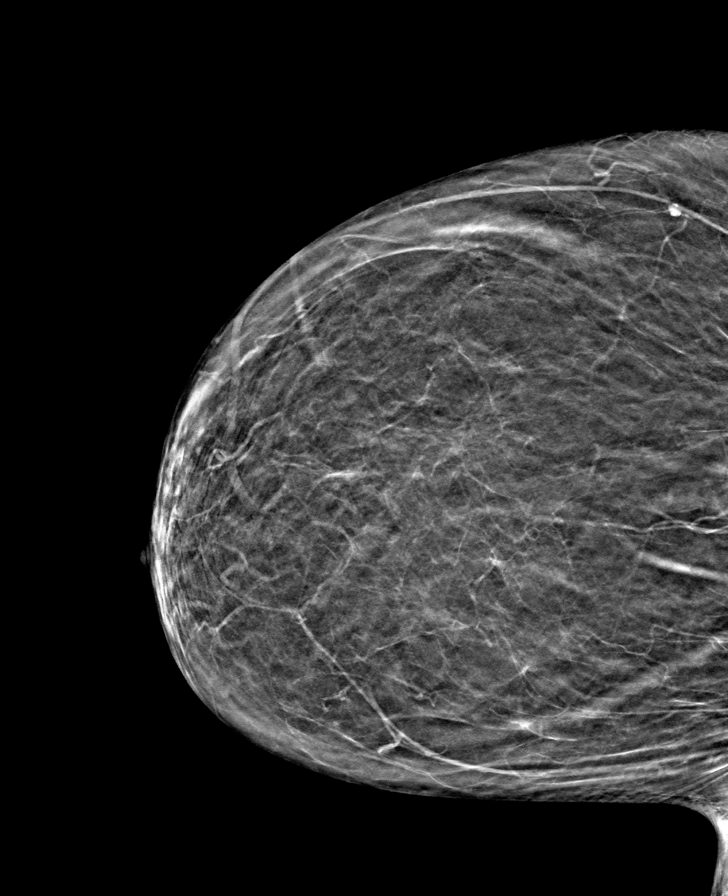

[L MLO tomo · tomo slice 27/52.0]
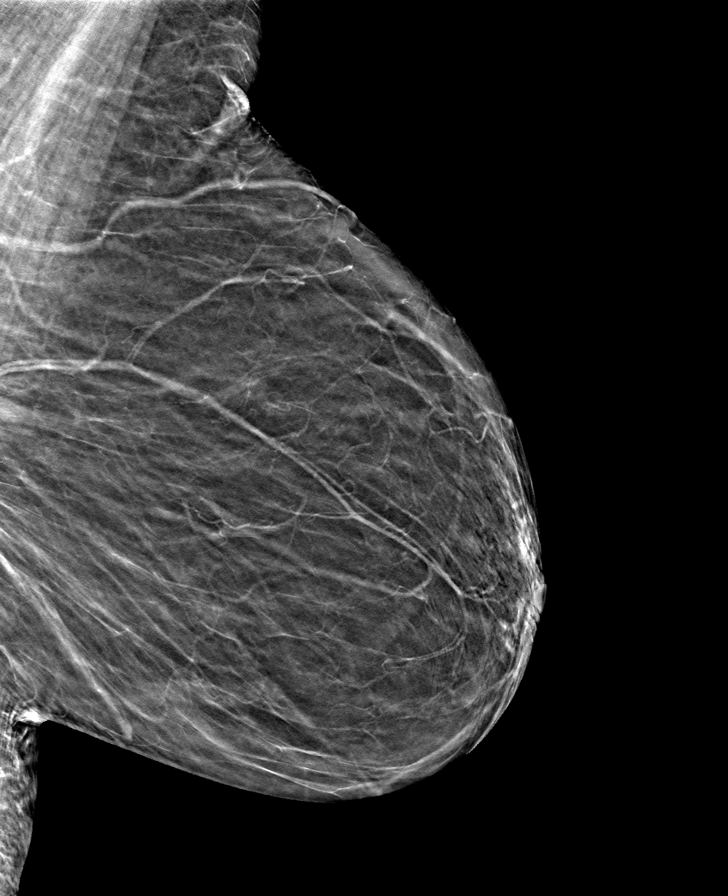

[L CC tomo · tomo slice 25/49.0]
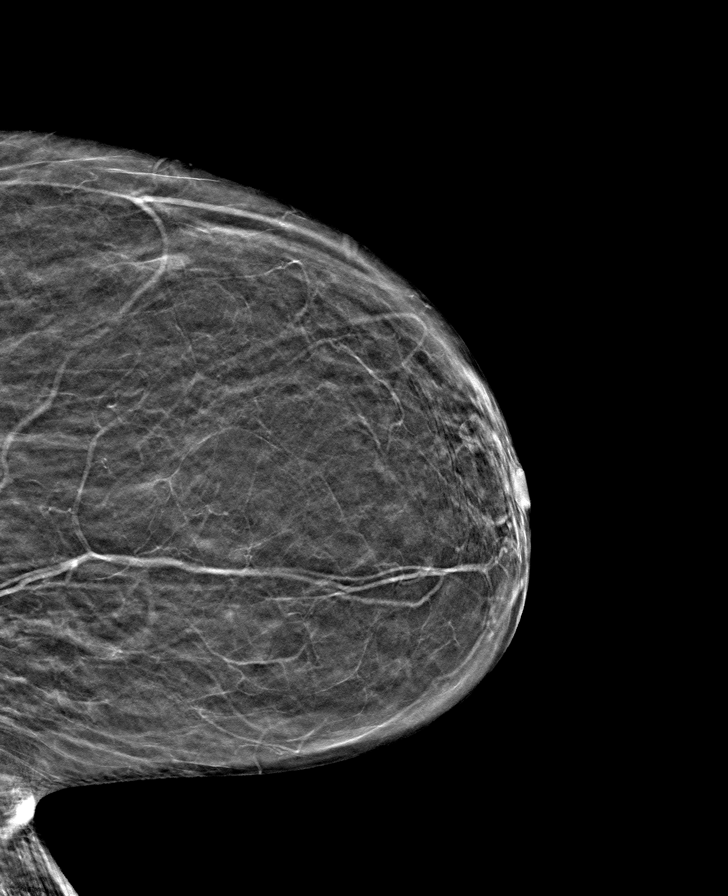

[R MLO tomo · tomo slice 29/56.0]
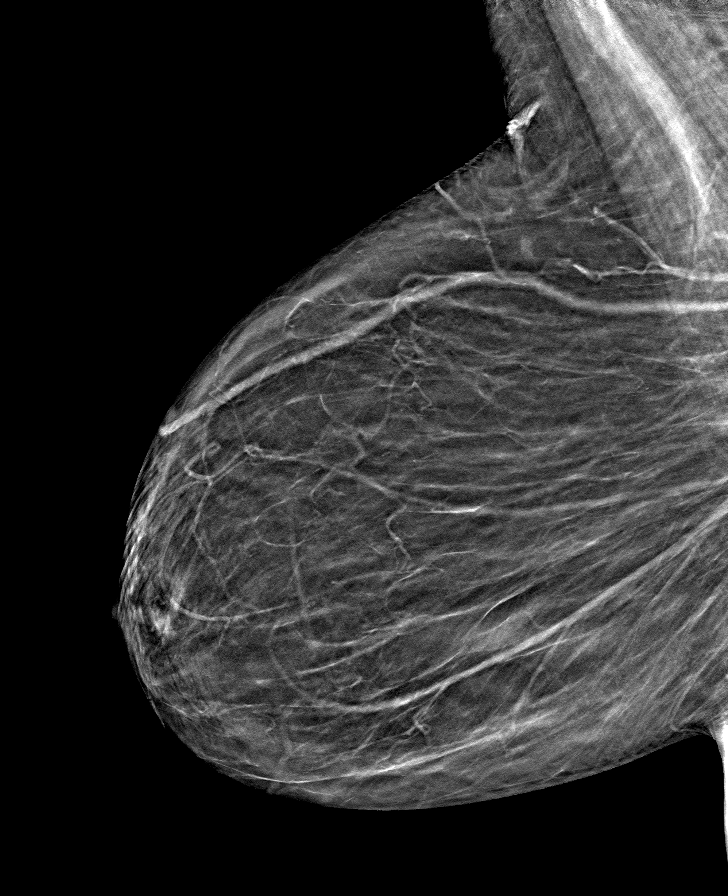

[8 of 24 positions shown; findings below may reference images not displayed]

FINDINGS: There are no findings suspicious for malignancy.
IMPRESSION: No mammographic evidence of malignancy. A result letter of this
screening mammogram will be mailed directly to the patient.

RECOMMENDATION:
Screening mammogram in one year. (Code:[8U])

BI-RADS CATEGORY  1: Negative.

## 2021-01-03 ENCOUNTER — Ambulatory Visit (INDEPENDENT_AMBULATORY_CARE_PROVIDER_SITE_OTHER): Payer: Medicare Other | Admitting: Nurse Practitioner

## 2021-01-03 ENCOUNTER — Encounter: Payer: Self-pay | Admitting: Nurse Practitioner

## 2021-01-03 ENCOUNTER — Other Ambulatory Visit: Payer: Self-pay

## 2021-01-03 VITALS — BP 126/80 | HR 90 | Temp 97.9°F | Ht 65.0 in | Wt 190.0 lb

## 2021-01-03 DIAGNOSIS — G8929 Other chronic pain: Secondary | ICD-10-CM

## 2021-01-03 DIAGNOSIS — I10 Essential (primary) hypertension: Secondary | ICD-10-CM

## 2021-01-03 DIAGNOSIS — Z23 Encounter for immunization: Secondary | ICD-10-CM

## 2021-01-03 DIAGNOSIS — E6609 Other obesity due to excess calories: Secondary | ICD-10-CM

## 2021-01-03 DIAGNOSIS — Z720 Tobacco use: Secondary | ICD-10-CM | POA: Diagnosis not present

## 2021-01-03 DIAGNOSIS — D539 Nutritional anemia, unspecified: Secondary | ICD-10-CM | POA: Diagnosis not present

## 2021-01-03 DIAGNOSIS — J302 Other seasonal allergic rhinitis: Secondary | ICD-10-CM

## 2021-01-03 DIAGNOSIS — Z6831 Body mass index (BMI) 31.0-31.9, adult: Secondary | ICD-10-CM

## 2021-01-03 DIAGNOSIS — H1033 Unspecified acute conjunctivitis, bilateral: Secondary | ICD-10-CM

## 2021-01-03 DIAGNOSIS — M5441 Lumbago with sciatica, right side: Secondary | ICD-10-CM

## 2021-01-03 DIAGNOSIS — D649 Anemia, unspecified: Secondary | ICD-10-CM | POA: Diagnosis not present

## 2021-01-03 MED ORDER — POLYMYXIN B-TRIMETHOPRIM 10000-0.1 UNIT/ML-% OP SOLN: 1.0000 [drp] | Freq: Four times a day (QID) | OPHTHALMIC | 0 refills | Status: DC

## 2021-01-03 NOTE — Patient Instructions (Signed)
Baby shampoo and wash eye lids  then apply eye drops  Once eyes start to improve can decrease use of baby shampoo washes to twice daily .

## 2021-01-03 NOTE — Progress Notes (Signed)
Careteam: Patient Care Team: Lauree Chandler, NP as PCP - General (Geriatric Medicine)  PLACE OF SERVICE:  [place]  Advanced Directive information    Allergies  Allergen Reactions   Aspirin Hives   Lyrica [Pregabalin]     Nightmares, did not help neuropathy   Penicillins Hives    Chief Complaint  Patient presents with   Acute Visit    Discharge from eyes X 1 week     HPI: Patient is a 60 y.o. female for acute visit due to red, itchy eyes that have drainage for the last week. Have become painful. She could not wait any longer to be seen and does not wish to come back for routine follow up in 1 weeks.  Reports painful, itchy eyes with yellowish discharge for 1 week.  Continues to smoke- 1/2 ppd  Not drinking ETOH  Has lost weight, drinking only water, continues to eat, decrease portion sizes.   Ongoing back pain- takes oxycodone for symptoms management.  Review of Systems:  Review of Systems  Constitutional:  Negative for chills, fever and weight loss.  HENT:  Negative for tinnitus.   Respiratory:  Negative for cough, sputum production and shortness of breath.   Cardiovascular:  Negative for chest pain, palpitations and leg swelling.  Gastrointestinal:  Negative for abdominal pain, constipation, diarrhea and heartburn.  Genitourinary:  Negative for dysuria, frequency and urgency.  Musculoskeletal:  Negative for back pain, falls, joint pain and myalgias.  Skin: Negative.   Neurological:  Negative for dizziness and headaches.  Psychiatric/Behavioral:  Negative for depression and memory loss. The patient is nervous/anxious. The patient does not have insomnia.    Past Medical History:  Diagnosis Date   Acute bronchitis    Cervical high risk HPV (human papillomavirus) test positive 12/2017   Negative subtype 16, 18/45   Chronic back pain    Complication of anesthesia    "didn't wake up"   Cough    Insomnia, unspecified    Other malaise and fatigue     Other specified diseases of blood and blood-forming organs(289.89)    Tobacco use disorder    Past Surgical History:  Procedure Laterality Date   ESOPHAGOGASTRODUODENOSCOPY N/A 03/17/2013   Procedure: ESOPHAGOGASTRODUODENOSCOPY (EGD);  Surgeon: Ladene Artist, MD;  Location[facility] ENDOSCOPY;  Service: Endoscopy;  Laterality: N/A;   FRACTURE SURGERY     Laminectomy and foraminotomy at L5-S1    12/12/2019   TONSILLECTOMY     Social History:   reports that she has been smoking cigarettes. She has a 6.00 pack-year smoking history. She has never used smokeless tobacco. She reports that she does not currently use alcohol. She reports that she does not use drugs.  Family History  Problem Relation Age of Onset   Arthritis Mother    Hypertension Sister    Hypertension Brother    Hypertension Sister    Hypertension Sister    Hypertension Sister     Medications: Patient's Medications  New Prescriptions   No medications on file  Previous Medications   ALBUTEROL (VENTOLIN HFA) 108 (90 BASE) MCG/ACT INHALER    INHALE 2 PUFFS INTO THE LUNGS EVERY 6 HOURS AS NEEDED FOR WHEEZING OR SHORTNESS OF BREATH   DIPHENHYDRAMINE (BENADRYL) 25 MG TABLET    Take 25 mg by mouth as needed.   FOLIC ACID (FOLVITE) 1 MG TABLET    Take 2 tablets (2 mg total) by mouth daily.   LOSARTAN (COZAAR) 25 MG TABLET  TAKE 1 TABLET(25 MG) BY MOUTH DAILY   OXYCODONE-ACETAMINOPHEN (PERCOCET/ROXICET) 5-325 MG TABLET    Take one tablet by mouth every 8-12 hours as needed for pain.  Modified Medications   No medications on file  Discontinued Medications   No medications on file    Physical Exam:  Vitals:   01/03/21 1109  BP: 126/80  Pulse: 90  Temp: 97.9 F (36.6 C)  TempSrc: Temporal  SpO2: 99%  Weight: 190 lb (86.2 kg)  Height: _0  (1.651 m)   Body mass index is 31.62 kg/m. Wt Readings from Last 3 Encounters:  01/03/21 190 lb (86.2 kg)  02/07/20 205 lb (93 kg)  10/25/19 217 lb (98.4 kg)    Physical  Exam Constitutional:      General: She is not in acute distress.    Appearance: She is well-developed. She is not diaphoretic.  HENT:     Head: Normocephalic and atraumatic.     Mouth/Throat:     Pharynx: No oropharyngeal exudate.  Eyes:     Conjunctiva/sclera:     Right eye: Right conjunctiva is injected. Exudate present.     Left eye: Left conjunctiva is injected. Exudate present.     Pupils: Pupils are equal, round, and reactive to light.  Cardiovascular:     Rate and Rhythm: Normal rate and regular rhythm.     Heart sounds: Normal heart sounds.  Pulmonary:     Effort: Pulmonary effort is normal.     Breath sounds: Normal breath sounds.  Abdominal:     General: Bowel sounds are normal.     Palpations: Abdomen is soft.  Musculoskeletal:     Cervical back: Normal range of motion and neck supple.     Right lower leg: No edema.     Left lower leg: No edema.  Skin:    General: Skin is warm and dry.  Neurological:     Mental Status: She is alert.  Psychiatric:        Mood and Affect: Mood normal.    Labs reviewed: Basic Metabolic Panel: Recent Labs    02/07/20 1228  NA 135  K 3.4*  CL 94*  CO2 27  GLUCOSE 98  BUN 10  CREATININE 0.49*  CALCIUM 9.3   Liver Function Tests: Recent Labs    02/07/20 1228  AST 41*  ALT 20  BILITOT 2.5*  PROT 6.8   No results for input(s): LIPASE, AMYLASE in the last 8760 hours. No results for input(s): AMMONIA in the last 8760 hours. CBC: Recent Labs    02/07/20 1228 02/15/20 0928  WBC 3.5* 3.7*  NEUTROABS 2,622 1,950  HGB 11.1* 11.5*  HCT 30.6* 31.6*  MCV 120.5* 120.2*  PLT 117* 233   Lipid Panel: No results for input(s): CHOL, HDL, LDLCALC, TRIG, CHOLHDL, LDLDIRECT in the last 8760 hours. TSH: No results for input(s): TSH in the last 8760 hours. A1C: Lab Results  Component Value Date   HGBA1C 4.8 07/01/2015     Assessment/Plan -1. Acute bacterial conjunctivitis of both eyes - trimethoprim-polymyxin b  (POLYTRIM) ophthalmic solution; Place 1 drop into both eyes QID.  Dispense: 10 mL; Refill: 0  2. Chronic right-sided low back pain with right-sided sciatica Ongoing stable. Continues on oxycydone/apap, takes as prescribed. Treatment agreement updated today.  - Urine Drug Screen w/Alc, no confirm([facility])  3. Essential hypertension -Blood pressure well controlled Continue current medications Recheck metabolic panel  - CMP with eGFR([facility]) - CBC with Differential/Platelet  4. Seasonal allergies -stable  at this time.  5. Tobacco abuse -continues to smoke, does not wish quit.  -encouraged cessation.  6. Class 1 obesity due to excess calories with serious comorbidity and body mass index (BMI) of 31.0 to 31.9 in adult --education provided on healthy weight loss through increase in physical activity and proper nutrition  - Lipid Panel  7. Need for influenza vaccination - Flu Vaccine QUAD 6+ mos PF IM (Fluarix Quad PF)   Next appt: 6 months.  Carlos American. [place], [facility] [phone]

## 2021-01-06 ENCOUNTER — Other Ambulatory Visit: Payer: Self-pay | Admitting: Nurse Practitioner

## 2021-01-06 DIAGNOSIS — D696 Thrombocytopenia, unspecified: Secondary | ICD-10-CM

## 2021-01-06 DIAGNOSIS — E876 Hypokalemia: Secondary | ICD-10-CM

## 2021-01-06 DIAGNOSIS — D519 Vitamin B12 deficiency anemia, unspecified: Secondary | ICD-10-CM

## 2021-01-06 MED ORDER — CALCIUM CARBONATE 600 MG PO TABS: 600.0000 mg | ORAL_TABLET | Freq: Two times a day (BID) | ORAL | 3 refills | Status: DC

## 2021-01-06 MED ORDER — POTASSIUM CHLORIDE CRYS ER 20 MEQ PO TBCR: 20.0000 meq | EXTENDED_RELEASE_TABLET | Freq: Every day | ORAL | 0 refills | Status: DC

## 2021-01-08 ENCOUNTER — Other Ambulatory Visit: Payer: Self-pay | Admitting: Nurse Practitioner

## 2021-01-08 DIAGNOSIS — E538 Deficiency of other specified B group vitamins: Secondary | ICD-10-CM

## 2021-01-10 ENCOUNTER — Ambulatory Visit: Payer: Medicare Other | Admitting: Nurse Practitioner

## 2021-01-10 LAB — CBC WITH DIFFERENTIAL/PLATELET
Absolute Monocytes: 252 cells/uL (ref 200–950)
Basophils Absolute: 60 cells/uL (ref 0–200)
Basophils Relative: 1.5 %
Eosinophils Absolute: 112 cells/uL (ref 15–500)
Eosinophils Relative: 2.8 %
HCT: 28.2 % — ABNORMAL LOW (ref 35.0–45.0)
Hemoglobin: 9.9 g/dL — ABNORMAL LOW (ref 11.7–15.5)
Lymphs Abs: 912 cells/uL (ref 850–3900)
MCH: 41.6 pg — ABNORMAL HIGH (ref 27.0–33.0)
MCHC: 35.1 g/dL (ref 32.0–36.0)
MCV: 118.5 fL — ABNORMAL HIGH (ref 80.0–100.0)
MPV: 12.8 fL — ABNORMAL HIGH (ref 7.5–12.5)
Monocytes Relative: 6.3 %
Neutro Abs: 2664 cells/uL (ref 1500–7800)
Neutrophils Relative %: 66.6 %
Platelets: 84 10*3/uL — ABNORMAL LOW (ref 140–400)
RBC: 2.38 10*6/uL — ABNORMAL LOW (ref 3.80–5.10)
RDW: 16.3 % — ABNORMAL HIGH (ref 11.0–15.0)
Total Lymphocyte: 22.8 %
WBC: 4 10*3/uL (ref 3.8–10.8)

## 2021-01-10 LAB — CBC MORPHOLOGY

## 2021-01-10 LAB — LIPID PANEL
Cholesterol: 140 mg/dL (ref <200)
HDL: 80 mg/dL (ref >=50)
LDL Cholesterol (Calc): 45 mg/dL (calc)
Non-HDL Cholesterol (Calc): 60 mg/dL (calc) (ref <130)
Total CHOL/HDL Ratio: 1.8 (calc) (ref <5.0)
Triglycerides: 70 mg/dL (ref <150)

## 2021-01-10 LAB — IRON,TIBC AND FERRITIN PANEL
%SAT: 24 % (calc) (ref 16–45)
Ferritin: 675 ng/mL — ABNORMAL HIGH (ref 16–232)
Iron: 47 ug/dL (ref 45–160)
TIBC: 200 mcg/dL (calc) — ABNORMAL LOW (ref 250–450)

## 2021-01-10 LAB — VITAMIN B12: Vitamin B-12: 551 pg/mL (ref 200–1100)

## 2021-01-10 LAB — COMPLETE METABOLIC PANEL WITH GFR
AG Ratio: 1.2 (calc) (ref 1.0–2.5)
ALT: 19 U/L (ref 6–29)
AST: 58 U/L — ABNORMAL HIGH (ref 10–35)
Albumin: 3.4 g/dL — ABNORMAL LOW (ref 3.6–5.1)
Alkaline phosphatase (APISO): 145 U/L (ref 37–153)
BUN/Creatinine Ratio: 17 (calc) (ref 6–22)
BUN: 8 mg/dL (ref 7–25)
CO2: 30 mmol/L (ref 20–32)
Calcium: 7.3 mg/dL — ABNORMAL LOW (ref 8.6–10.4)
Chloride: 97 mmol/L — ABNORMAL LOW (ref 98–110)
Creat: 0.46 mg/dL — ABNORMAL LOW (ref 0.50–1.03)
Globulin: 2.8 g/dL (calc) (ref 1.9–3.7)
Glucose, Bld: 78 mg/dL (ref 65–99)
Potassium: 3.3 mmol/L — ABNORMAL LOW (ref 3.5–5.3)
Sodium: 135 mmol/L (ref 135–146)
Total Bilirubin: 1.1 mg/dL (ref 0.2–1.2)
Total Protein: 6.2 g/dL (ref 6.1–8.1)
eGFR: 111 mL/min/{1.73_m2} (ref >=60)

## 2021-01-10 LAB — DRUG MONITOR, PANEL 1, W/CONF, URINE
Amphetamines: NEGATIVE ng/mL (ref <500)
Barbiturates: NEGATIVE ng/mL (ref <300)
Benzodiazepines: NEGATIVE ng/mL (ref <100)
Cocaine Metabolite: NEGATIVE ng/mL (ref <150)
Codeine: NEGATIVE ng/mL (ref <50)
Creatinine: 195.6 mg/dL (ref >=20.0)
Hydrocodone: NEGATIVE ng/mL (ref <50)
Hydromorphone: NEGATIVE ng/mL (ref <50)
Marijuana Metabolite: NEGATIVE ng/mL (ref <20)
Methadone Metabolite: NEGATIVE ng/mL (ref <100)
Morphine: NEGATIVE ng/mL (ref <50)
Norhydrocodone: NEGATIVE ng/mL (ref <50)
Noroxycodone: 5928 ng/mL — ABNORMAL HIGH (ref <50)
Opiates: NEGATIVE ng/mL (ref <100)
Oxidant: NEGATIVE ug/mL (ref <200)
Oxycodone: 1693 ng/mL — ABNORMAL HIGH (ref <50)
Oxycodone: POSITIVE ng/mL — AB (ref <100)
Oxymorphone: 2467 ng/mL — ABNORMAL HIGH (ref <50)
Phencyclidine: NEGATIVE ng/mL (ref <25)
pH: 9 (ref 4.5–9.0)

## 2021-01-10 LAB — TEST AUTHORIZATION

## 2021-01-10 LAB — DM TEMPLATE

## 2021-01-13 ENCOUNTER — Other Ambulatory Visit: Payer: Medicare Other

## 2021-01-13 ENCOUNTER — Other Ambulatory Visit: Payer: Self-pay

## 2021-01-13 ENCOUNTER — Telehealth: Payer: Self-pay | Admitting: *Deleted

## 2021-01-13 DIAGNOSIS — E876 Hypokalemia: Secondary | ICD-10-CM

## 2021-01-13 DIAGNOSIS — E538 Deficiency of other specified B group vitamins: Secondary | ICD-10-CM

## 2021-01-13 DIAGNOSIS — D519 Vitamin B12 deficiency anemia, unspecified: Secondary | ICD-10-CM

## 2021-01-13 DIAGNOSIS — D696 Thrombocytopenia, unspecified: Secondary | ICD-10-CM | POA: Diagnosis not present

## 2021-01-13 NOTE — Telephone Encounter (Signed)
Patient came into office for labs and requested to speak with Shanda Bumps.   I spoke with patient and he had concerns with Left Leg Swelling. Stated that it has been going on for some time now.   Stated that there are no other symptoms just swelling. Not red, Not warm to touch and not painful. Props up leg and swelling does down sometimes.   Offered an appointment for today but patient refused. Stated that he only wanted to see Shanda Bumps. Next available was 10/10, scheduled.   Patient stated that if any new symptoms occur or gets worse, he will call back and make sooner appointment.

## 2021-01-14 LAB — BASIC METABOLIC PANEL WITH GFR
BUN/Creatinine Ratio: 9 (calc) (ref 6–22)
BUN: 4 mg/dL — ABNORMAL LOW (ref 7–25)
CO2: 26 mmol/L (ref 20–32)
Calcium: 8.8 mg/dL (ref 8.6–10.4)
Chloride: 110 mmol/L (ref 98–110)
Creat: 0.45 mg/dL — ABNORMAL LOW (ref 0.50–1.03)
Glucose, Bld: 75 mg/dL (ref 65–99)
Potassium: 3.6 mmol/L (ref 3.5–5.3)
Sodium: 147 mmol/L — ABNORMAL HIGH (ref 135–146)
eGFR: 111 mL/min/{1.73_m2} (ref >=60)

## 2021-01-14 LAB — CBC WITH DIFFERENTIAL/PLATELET
Absolute Monocytes: 173 cells/uL — ABNORMAL LOW (ref 200–950)
Basophils Absolute: 132 cells/uL (ref 0–200)
Basophils Relative: 4.9 %
Eosinophils Absolute: 122 cells/uL (ref 15–500)
Eosinophils Relative: 4.5 %
HCT: 30.5 % — ABNORMAL LOW (ref 35.0–45.0)
Hemoglobin: 10.4 g/dL — ABNORMAL LOW (ref 11.7–15.5)
Lymphs Abs: 813 cells/uL — ABNORMAL LOW (ref 850–3900)
MCH: 39.8 pg — ABNORMAL HIGH (ref 27.0–33.0)
MCHC: 34.1 g/dL (ref 32.0–36.0)
MCV: 116.9 fL — ABNORMAL HIGH (ref 80.0–100.0)
MPV: 10.6 fL (ref 7.5–12.5)
Monocytes Relative: 6.4 %
Neutro Abs: 1461 cells/uL — ABNORMAL LOW (ref 1500–7800)
Neutrophils Relative %: 54.1 %
Platelets: 316 10*3/uL (ref 140–400)
RBC: 2.61 10*6/uL — ABNORMAL LOW (ref 3.80–5.10)
RDW: 17.2 % — ABNORMAL HIGH (ref 11.0–15.0)
Total Lymphocyte: 30.1 %
WBC: 2.7 10*3/uL — ABNORMAL LOW (ref 3.8–10.8)

## 2021-01-14 LAB — FOLATE: Folate: 1.9 ng/mL — ABNORMAL LOW

## 2021-01-14 LAB — PATHOLOGIST SMEAR REVIEW

## 2021-01-14 LAB — CBC MORPHOLOGY

## 2021-01-14 NOTE — Telephone Encounter (Signed)
For lower leg swelling recommend low sodium diet, making sure getting enough protein in diet.  Elevate legs "toes above nose" for a least 45 mins to 1 hour in the afternoon.  Prop legs up throughout the day when sitting as tolerates.  Use compression hose/socks daily (remove at bedtime)

## 2021-01-14 NOTE — Telephone Encounter (Signed)
Patient notified and agreed.  

## 2021-01-17 ENCOUNTER — Other Ambulatory Visit: Payer: Self-pay | Admitting: *Deleted

## 2021-01-17 DIAGNOSIS — G8929 Other chronic pain: Secondary | ICD-10-CM

## 2021-01-17 MED ORDER — OXYCODONE-ACETAMINOPHEN 5-325 MG PO TABS: ORAL_TABLET | ORAL | 0 refills | Status: DC

## 2021-01-17 NOTE — Telephone Encounter (Signed)
Patient requested refill.  Epic LR: 12/19/2020 Contract on File.  Pended Rx and sent to [facility] for approval.

## 2021-01-27 ENCOUNTER — Ambulatory Visit: Payer: Medicare Other | Admitting: Nurse Practitioner

## 2021-02-10 ENCOUNTER — Ambulatory Visit: Payer: Medicare Other | Admitting: Nurse Practitioner

## 2021-02-18 ENCOUNTER — Other Ambulatory Visit: Payer: Self-pay | Admitting: *Deleted

## 2021-02-18 DIAGNOSIS — M5441 Lumbago with sciatica, right side: Secondary | ICD-10-CM

## 2021-02-18 DIAGNOSIS — G8929 Other chronic pain: Secondary | ICD-10-CM

## 2021-02-18 MED ORDER — OXYCODONE-ACETAMINOPHEN 5-325 MG PO TABS: ORAL_TABLET | ORAL | 0 refills | Status: DC

## 2021-02-18 NOTE — Telephone Encounter (Signed)
Patient requested refill.  Epic LR: 01/17/2021 Contract on [employer] Rx and sent to [place] for approval.

## 2021-03-20 ENCOUNTER — Other Ambulatory Visit: Payer: Self-pay | Admitting: *Deleted

## 2021-03-20 DIAGNOSIS — G8929 Other chronic pain: Secondary | ICD-10-CM

## 2021-03-20 MED ORDER — OXYCODONE-ACETAMINOPHEN 5-325 MG PO TABS: ORAL_TABLET | ORAL | 0 refills | Status: DC

## 2021-03-20 NOTE — Telephone Encounter (Signed)
Patient requested refill.  Epic LR: 02/18/2021 Contract date: 01/03/2021 Pended Rx and sent to [facility] for approval.

## 2021-04-18 ENCOUNTER — Other Ambulatory Visit: Payer: Self-pay | Admitting: *Deleted

## 2021-04-18 DIAGNOSIS — G8929 Other chronic pain: Secondary | ICD-10-CM

## 2021-04-18 MED ORDER — OXYCODONE-ACETAMINOPHEN 5-325 MG PO TABS: ORAL_TABLET | ORAL | 0 refills | Status: DC

## 2021-04-18 NOTE — Telephone Encounter (Signed)
Patient requested refill.  Epic LR: 03/20/2021 Contract Date: 01/03/2021 Pended Rx and sent to [facility] for approval.

## 2021-05-21 ENCOUNTER — Other Ambulatory Visit: Payer: Self-pay | Admitting: *Deleted

## 2021-05-21 DIAGNOSIS — G8929 Other chronic pain: Secondary | ICD-10-CM

## 2021-05-21 DIAGNOSIS — M5441 Lumbago with sciatica, right side: Secondary | ICD-10-CM

## 2021-05-21 MED ORDER — OXYCODONE-ACETAMINOPHEN 5-325 MG PO TABS: ORAL_TABLET | ORAL | 0 refills | Status: DC

## 2021-05-21 NOTE — Telephone Encounter (Signed)
Patient requested refill.  Epic LR: 04/18/2021 Contract Date: 01/03/2021 Pended Rx and sent to [facility] for approval.

## 2021-06-12 ENCOUNTER — Other Ambulatory Visit: Payer: Self-pay

## 2021-06-12 ENCOUNTER — Telehealth (INDEPENDENT_AMBULATORY_CARE_PROVIDER_SITE_OTHER): Payer: Medicare Other | Admitting: Nurse Practitioner

## 2021-06-12 DIAGNOSIS — K529 Noninfective gastroenteritis and colitis, unspecified: Secondary | ICD-10-CM | POA: Diagnosis not present

## 2021-06-12 MED ORDER — METRONIDAZOLE 500 MG PO TABS
500.0000 mg | ORAL_TABLET | Freq: Three times a day (TID) | ORAL | 0 refills | Status: AC
Start: 2021-06-12 — End: 2021-06-19

## 2021-06-12 NOTE — Progress Notes (Signed)
This service is provided via telemedicine  No vital signs collected/recorded due to the encounter was a telemedicine visit.   Location of patient (ex: home, work):  Home  Patient consents to a telephone visit:  Yes, see encounter dated 10/17/2020  Location of the provider (ex: office, home):  [facility]  Name of any referring provider:  N/A  Names of all persons participating in the telemedicine service and their role in the encounter:  Abbey Chatters, Nurse Practitioner, Elveria Royals, CMA, and patient.   Time spent on call:  9 minutes with medical assistant

## 2021-06-12 NOTE — Progress Notes (Signed)
Careteam: Patient Care Team: Sharon Seller, NP as PCP - General (Geriatric Medicine)  Advanced Directive information    Allergies  Allergen Reactions   Aspirin Hives   Lyrica [Pregabalin]     Nightmares, did not help neuropathy   Penicillins Hives    Chief Complaint  Patient presents with   Acute Visit    Patient complains of nausea, vomiting, fatigue, chills, nasal congestion, shortness of breath and dizziness. Patient has been experiencing these symptoms for a while,but has just gotten worse. Symptoms have not improved. Patient states that when he goes to the bathroom he has to get right back in the bed. Patient took pepto bismol for stomach and didn't help. Denies any fever. Patient has not taking COVID test.     HPI: Patient is a 61 y.o. female via virtual visit.  Reports symptoms have been coming on "awhile" but has gotten worse.  Unsure how long symptoms have been bad.  Reports stomach is queasy and he is weak.  Has not eating anything but chicken and broth for ~3 weeks.  Reports nausea as soon as he stands up.  No abdominal pain.  Does not feel feverish. Normal temperate yesterday. No fevers. Having body aches.  Toes are tingling numb.  Urinating normally Reports weakness.  Having diarrhea, ~3 weeks.  No recent traveling.  No recent antibiotics  Wanted to come in to be seen but could not make it to the care due to weakness.    Review of Systems:  Review of Systems  Constitutional:  Positive for chills and malaise/fatigue. Negative for fever.  Respiratory:  Negative for shortness of breath.   Cardiovascular:  Negative for chest pain.  Gastrointestinal:  Positive for diarrhea, nausea and vomiting. Negative for abdominal pain, blood in stool and constipation.  Neurological:  Positive for dizziness.   Past Medical History:  Diagnosis Date   Acute bronchitis    Cervical high risk HPV (human papillomavirus) test positive 12/2017   Negative subtype 16,  18/45   Chronic back pain    Complication of anesthesia    "didn't wake up"   Cough    Insomnia, unspecified    Other malaise and fatigue    Other specified diseases of blood and blood-forming organs(289.89)    Tobacco use disorder    Past Surgical History:  Procedure Laterality Date   ESOPHAGOGASTRODUODENOSCOPY N/A 03/17/2013   Procedure: ESOPHAGOGASTRODUODENOSCOPY (EGD);  Surgeon: Meryl Dare, MD;  Location: [facility] ENDOSCOPY;  Service: Endoscopy;  Laterality: N/A;   FRACTURE SURGERY     Laminectomy and foraminotomy at L5-S1    12/12/2019   TONSILLECTOMY     Social History:   reports that she has been smoking cigarettes. She has a 6.00 pack-year smoking history. She has never used smokeless tobacco. She reports that she does not currently use alcohol. She reports that she does not use drugs.  Family History  Problem Relation Age of Onset   Arthritis Mother    Hypertension Sister    Hypertension Brother    Hypertension Sister    Hypertension Sister    Hypertension Sister     Medications: Patient's Medications  New Prescriptions   No medications on file  Previous Medications   ALBUTEROL (VENTOLIN HFA) 108 (90 BASE) MCG/ACT INHALER    INHALE 2 PUFFS INTO THE LUNGS EVERY 6 HOURS AS NEEDED FOR WHEEZING OR SHORTNESS OF BREATH   CALCIUM CARBONATE (CALCIUM 600) 600 MG TABS TABLET    Take 1 tablet (600  mg total) by mouth 2 (two) times daily with a meal.   DIPHENHYDRAMINE (BENADRYL) 25 MG TABLET    Take 25 mg by mouth as needed.   FOLIC ACID (FOLVITE) 1 MG TABLET    Take 2 tablets (2 mg total) by mouth daily.   LOSARTAN (COZAAR) 25 MG TABLET    TAKE 1 TABLET(25 MG) BY MOUTH DAILY   OXYCODONE-ACETAMINOPHEN (PERCOCET/ROXICET) 5-325 MG TABLET    Take one tablet by mouth every 8-12 hours as needed for pain.   POTASSIUM CHLORIDE (KLOR-CON) 20 MEQ TABLET    Take 1 tablet (20 mEq total) by mouth daily.   TRIMETHOPRIM-POLYMYXIN B (POLYTRIM) OPHTHALMIC SOLUTION    Place 1 drop into both eyes  QID.  Modified Medications   No medications on file  Discontinued Medications   No medications on file    Physical Exam:  There were no vitals filed for this visit. There is no height or weight on file to calculate BMI. Wt Readings from Last 3 Encounters:  01/03/21 190 lb (86.2 kg)  02/07/20 205 lb (93 kg)  10/25/19 217 lb (98.4 kg)    Physical Exam Constitutional:      Appearance: Normal appearance.  Neurological:     Mental Status: She is alert and oriented to person, place, and time. Mental status is at baseline.    Labs reviewed: Basic Metabolic Panel: Recent Labs    01/03/21 1151 01/13/21 1014  NA 135 147*  K 3.3* 3.6  CL 97* 110  CO2 30 26  GLUCOSE 78 75  BUN 8 4*  CREATININE 0.46* 0.45*  CALCIUM 7.3* 8.8   Liver Function Tests: Recent Labs    01/03/21 1151  AST 58*  ALT 19  BILITOT 1.1  PROT 6.2   No results for input(s): LIPASE, AMYLASE in the last 8760 hours. No results for input(s): AMMONIA in the last 8760 hours. CBC: Recent Labs    01/03/21 1151 01/13/21 1014  WBC 4.0 2.7*  NEUTROABS 2,664 1,461*  HGB 9.9* 10.4*  HCT 28.2* 30.5*  MCV 118.5* 116.9*  PLT 84* 316   Lipid Panel: Recent Labs    01/03/21 1151  CHOL 140  HDL 80  LDLCALC 45  TRIG 70  CHOLHDL 1.8   TSH: No results for input(s): TSH in the last 8760 hours. A1C: Lab Results  Component Value Date   HGBA1C 4.8 07/01/2015     Assessment/Plan 1. Gastroenteritis -start probiotic twice daily -will have him start flagyl TID due to duration of symptoms.  -add electrolyte drink -advance diet as tolerated - metroNIDAZOLE (FLAGYL) 500 MG tablet; Take 1 tablet (500 mg total) by mouth 3 (three) times daily for 7 days.  Dispense: 21 tablet; Refill: 0 STRICT follow up precautions and to go to ED if symptoms worsen  Deiondre Harrower K. Biagio Borg  [facility] [phone]    Virtual Visit via video  I connected with patient on 06/12/21 at  3:15 PM  EST by mychart video and verified that I am speaking with the correct person using two identifiers.  Location: Patient: home Provider: [facility]    I discussed the limitations, risks, security and privacy concerns of performing an evaluation and management service by telephone and the availability of in person appointments. I also discussed with the patient that there may be a patient responsible charge related to this service. The patient expressed understanding and agreed to proceed.   I discussed the assessment and treatment plan with the  patient. The patient was provided an opportunity to ask questions and all were answered. The patient agreed with the plan and demonstrated an understanding of the instructions.   The patient was advised to call back or seek an in-person evaluation if the symptoms worsen or if the condition fails to improve as anticipated.  I provided 18 minutes of non-face-to-face time during this encounter.  Janene Harvey. Biagio Borg Avs printed and mailed

## 2021-06-18 ENCOUNTER — Other Ambulatory Visit: Payer: Self-pay | Admitting: *Deleted

## 2021-06-18 DIAGNOSIS — G8929 Other chronic pain: Secondary | ICD-10-CM

## 2021-06-18 NOTE — Telephone Encounter (Signed)
Patient requested refill.  ?Epic LR: 05/21/2021 ?Contract Date: 01/03/2022 ?Pended Rx and sent to [facility] for approval.  ?

## 2021-06-19 MED ORDER — OXYCODONE-ACETAMINOPHEN 5-325 MG PO TABS: ORAL_TABLET | ORAL | 0 refills | Status: DC

## 2021-06-29 ENCOUNTER — Emergency Department (HOSPITAL_COMMUNITY): Payer: Medicare Other

## 2021-06-29 ENCOUNTER — Inpatient Hospital Stay (HOSPITAL_COMMUNITY)
Admission: EM | Admit: 2021-06-29 | Discharge: 2021-07-02 | DRG: 391 | Disposition: A | Discharge status: Home Health | Payer: Medicare Other | Attending: Family Medicine | Admitting: Family Medicine

## 2021-06-29 DIAGNOSIS — E861 Hypovolemia: Secondary | ICD-10-CM | POA: Diagnosis not present

## 2021-06-29 DIAGNOSIS — R531 Weakness: Secondary | ICD-10-CM | POA: Diagnosis not present

## 2021-06-29 DIAGNOSIS — Z6831 Body mass index (BMI) 31.0-31.9, adult: Secondary | ICD-10-CM

## 2021-06-29 DIAGNOSIS — E860 Dehydration: Secondary | ICD-10-CM | POA: Diagnosis not present

## 2021-06-29 DIAGNOSIS — G4700 Insomnia, unspecified: Secondary | ICD-10-CM | POA: Diagnosis not present

## 2021-06-29 DIAGNOSIS — D638 Anemia in other chronic diseases classified elsewhere: Secondary | ICD-10-CM | POA: Diagnosis present

## 2021-06-29 DIAGNOSIS — D62 Acute posthemorrhagic anemia: Secondary | ICD-10-CM | POA: Diagnosis not present

## 2021-06-29 DIAGNOSIS — I959 Hypotension, unspecified: Secondary | ICD-10-CM | POA: Diagnosis present

## 2021-06-29 DIAGNOSIS — K529 Noninfective gastroenteritis and colitis, unspecified: Secondary | ICD-10-CM | POA: Diagnosis not present

## 2021-06-29 DIAGNOSIS — E669 Obesity, unspecified: Secondary | ICD-10-CM | POA: Diagnosis present

## 2021-06-29 DIAGNOSIS — G8929 Other chronic pain: Secondary | ICD-10-CM | POA: Diagnosis not present

## 2021-06-29 DIAGNOSIS — D649 Anemia, unspecified: Secondary | ICD-10-CM | POA: Diagnosis not present

## 2021-06-29 DIAGNOSIS — E538 Deficiency of other specified B group vitamins: Secondary | ICD-10-CM | POA: Diagnosis not present

## 2021-06-29 DIAGNOSIS — Z79899 Other long term (current) drug therapy: Secondary | ICD-10-CM | POA: Diagnosis not present

## 2021-06-29 DIAGNOSIS — R000 Tachycardia, unspecified: Secondary | ICD-10-CM | POA: Diagnosis not present

## 2021-06-29 DIAGNOSIS — R0902 Hypoxemia: Secondary | ICD-10-CM | POA: Diagnosis not present

## 2021-06-29 DIAGNOSIS — E872 Acidosis, unspecified: Secondary | ICD-10-CM | POA: Diagnosis present

## 2021-06-29 DIAGNOSIS — Z20822 Contact with and (suspected) exposure to covid-19: Secondary | ICD-10-CM | POA: Diagnosis not present

## 2021-06-29 DIAGNOSIS — R109 Unspecified abdominal pain: Secondary | ICD-10-CM | POA: Diagnosis not present

## 2021-06-29 DIAGNOSIS — F17210 Nicotine dependence, cigarettes, uncomplicated: Secondary | ICD-10-CM | POA: Diagnosis present

## 2021-06-29 DIAGNOSIS — M545 Low back pain, unspecified: Secondary | ICD-10-CM | POA: Diagnosis not present

## 2021-06-29 DIAGNOSIS — Z8249 Family history of ischemic heart disease and other diseases of the circulatory system: Secondary | ICD-10-CM

## 2021-06-29 DIAGNOSIS — I491 Atrial premature depolarization: Secondary | ICD-10-CM | POA: Diagnosis not present

## 2021-06-29 DIAGNOSIS — I10 Essential (primary) hypertension: Secondary | ICD-10-CM | POA: Diagnosis present

## 2021-06-29 DIAGNOSIS — E876 Hypokalemia: Secondary | ICD-10-CM | POA: Diagnosis present

## 2021-06-29 DIAGNOSIS — Z886 Allergy status to analgesic agent status: Secondary | ICD-10-CM

## 2021-06-29 DIAGNOSIS — K760 Fatty (change of) liver, not elsewhere classified: Secondary | ICD-10-CM | POA: Diagnosis present

## 2021-06-29 DIAGNOSIS — D539 Nutritional anemia, unspecified: Secondary | ICD-10-CM | POA: Diagnosis not present

## 2021-06-29 DIAGNOSIS — E43 Unspecified severe protein-calorie malnutrition: Secondary | ICD-10-CM | POA: Diagnosis not present

## 2021-06-29 DIAGNOSIS — D696 Thrombocytopenia, unspecified: Secondary | ICD-10-CM | POA: Diagnosis not present

## 2021-06-29 DIAGNOSIS — R197 Diarrhea, unspecified: Secondary | ICD-10-CM | POA: Diagnosis not present

## 2021-06-29 DIAGNOSIS — R634 Abnormal weight loss: Secondary | ICD-10-CM | POA: Diagnosis not present

## 2021-06-29 DIAGNOSIS — R160 Hepatomegaly, not elsewhere classified: Secondary | ICD-10-CM | POA: Diagnosis present

## 2021-06-29 DIAGNOSIS — Z880 Allergy status to penicillin: Secondary | ICD-10-CM

## 2021-06-29 DIAGNOSIS — M1990 Unspecified osteoarthritis, unspecified site: Secondary | ICD-10-CM | POA: Diagnosis not present

## 2021-06-29 DIAGNOSIS — Z888 Allergy status to other drugs, medicaments and biological substances status: Secondary | ICD-10-CM

## 2021-06-29 LAB — I-STAT CHEM 8, ED
BUN: <3 mg/dL — ABNORMAL LOW (ref 6–20)
Calcium, Ion: 0.75 mmol/L — CL (ref 1.15–1.40)
Chloride: 94 mmol/L — ABNORMAL LOW (ref 98–111)
Creatinine, Ser: 0.4 mg/dL — ABNORMAL LOW (ref 0.44–1.00)
Glucose, Bld: 93 mg/dL (ref 70–99)
HCT: 22 % — ABNORMAL LOW (ref 36.0–46.0)
Hemoglobin: 7.5 g/dL — ABNORMAL LOW (ref 12.0–15.0)
Potassium: 2.6 mmol/L — CL (ref 3.5–5.1)
Sodium: 136 mmol/L (ref 135–145)
TCO2: 29 mmol/L (ref 22–32)

## 2021-06-29 LAB — CBC
HCT: 17.8 % — ABNORMAL LOW (ref 36.0–46.0)
Hemoglobin: 5.8 g/dL — CL (ref 12.0–15.0)
MCH: 40.6 pg — ABNORMAL HIGH (ref 26.0–34.0)
MCHC: 32.6 g/dL (ref 30.0–36.0)
MCV: 124.5 fL — ABNORMAL HIGH (ref 80.0–100.0)
Platelets: 168 10*3/uL (ref 150–400)
RBC: 1.43 MIL/uL — ABNORMAL LOW (ref 3.87–5.11)
WBC: 7.2 10*3/uL (ref 4.0–10.5)
nRBC: 0.3 % — ABNORMAL HIGH (ref 0.0–0.2)

## 2021-06-29 LAB — POC OCCULT BLOOD, ED: Fecal Occult Bld: NEGATIVE

## 2021-06-29 LAB — RESP PANEL BY RT-PCR (FLU A&B, COVID) ARPGX2
Influenza A by PCR: NEGATIVE
Influenza B by PCR: NEGATIVE
SARS Coronavirus 2 by RT PCR: NEGATIVE

## 2021-06-29 LAB — COMPREHENSIVE METABOLIC PANEL
ALT: 17 U/L (ref 0–44)
AST: 34 U/L (ref 15–41)
Albumin: 2.2 g/dL — ABNORMAL LOW (ref 3.5–5.0)
Alkaline Phosphatase: 176 U/L — ABNORMAL HIGH (ref 38–126)
Anion gap: 13 (ref 5–15)
BUN: <5 mg/dL — ABNORMAL LOW (ref 6–20)
CO2: 25 mmol/L (ref 22–32)
Calcium: 6.6 mg/dL — ABNORMAL LOW (ref 8.9–10.3)
Chloride: 97 mmol/L — ABNORMAL LOW (ref 98–111)
Creatinine, Ser: 0.56 mg/dL (ref 0.44–1.00)
GFR, Estimated: >60 mL/min (ref >60)
Glucose, Bld: 89 mg/dL (ref 70–99)
Potassium: 2.8 mmol/L — ABNORMAL LOW (ref 3.5–5.1)
Sodium: 135 mmol/L (ref 135–145)
Total Bilirubin: 2.7 mg/dL — ABNORMAL HIGH (ref 0.3–1.2)
Total Protein: 4.6 g/dL — ABNORMAL LOW (ref 6.5–8.1)

## 2021-06-29 LAB — LACTIC ACID, PLASMA
Lactic Acid, Venous: 4 mmol/L (ref 0.5–1.9)
Lactic Acid, Venous: 4.3 mmol/L (ref 0.5–1.9)

## 2021-06-29 LAB — PROTIME-INR
INR: 1.2 (ref 0.8–1.2)
Prothrombin Time: 15.1 seconds (ref 11.4–15.2)

## 2021-06-29 LAB — TROPONIN I (HIGH SENSITIVITY): Troponin I (High Sensitivity): 7 ng/L (ref <18)

## 2021-06-29 LAB — PREPARE RBC (CROSSMATCH)

## 2021-06-29 IMAGING — DX DG CHEST 1V PORT
1 series · 1 of 1 positions shown · non-contrast
Comparison: [DATE]

CLINICAL DATA: Weakness

EXAM:
PORTABLE CHEST 1 VIEW

[chest]
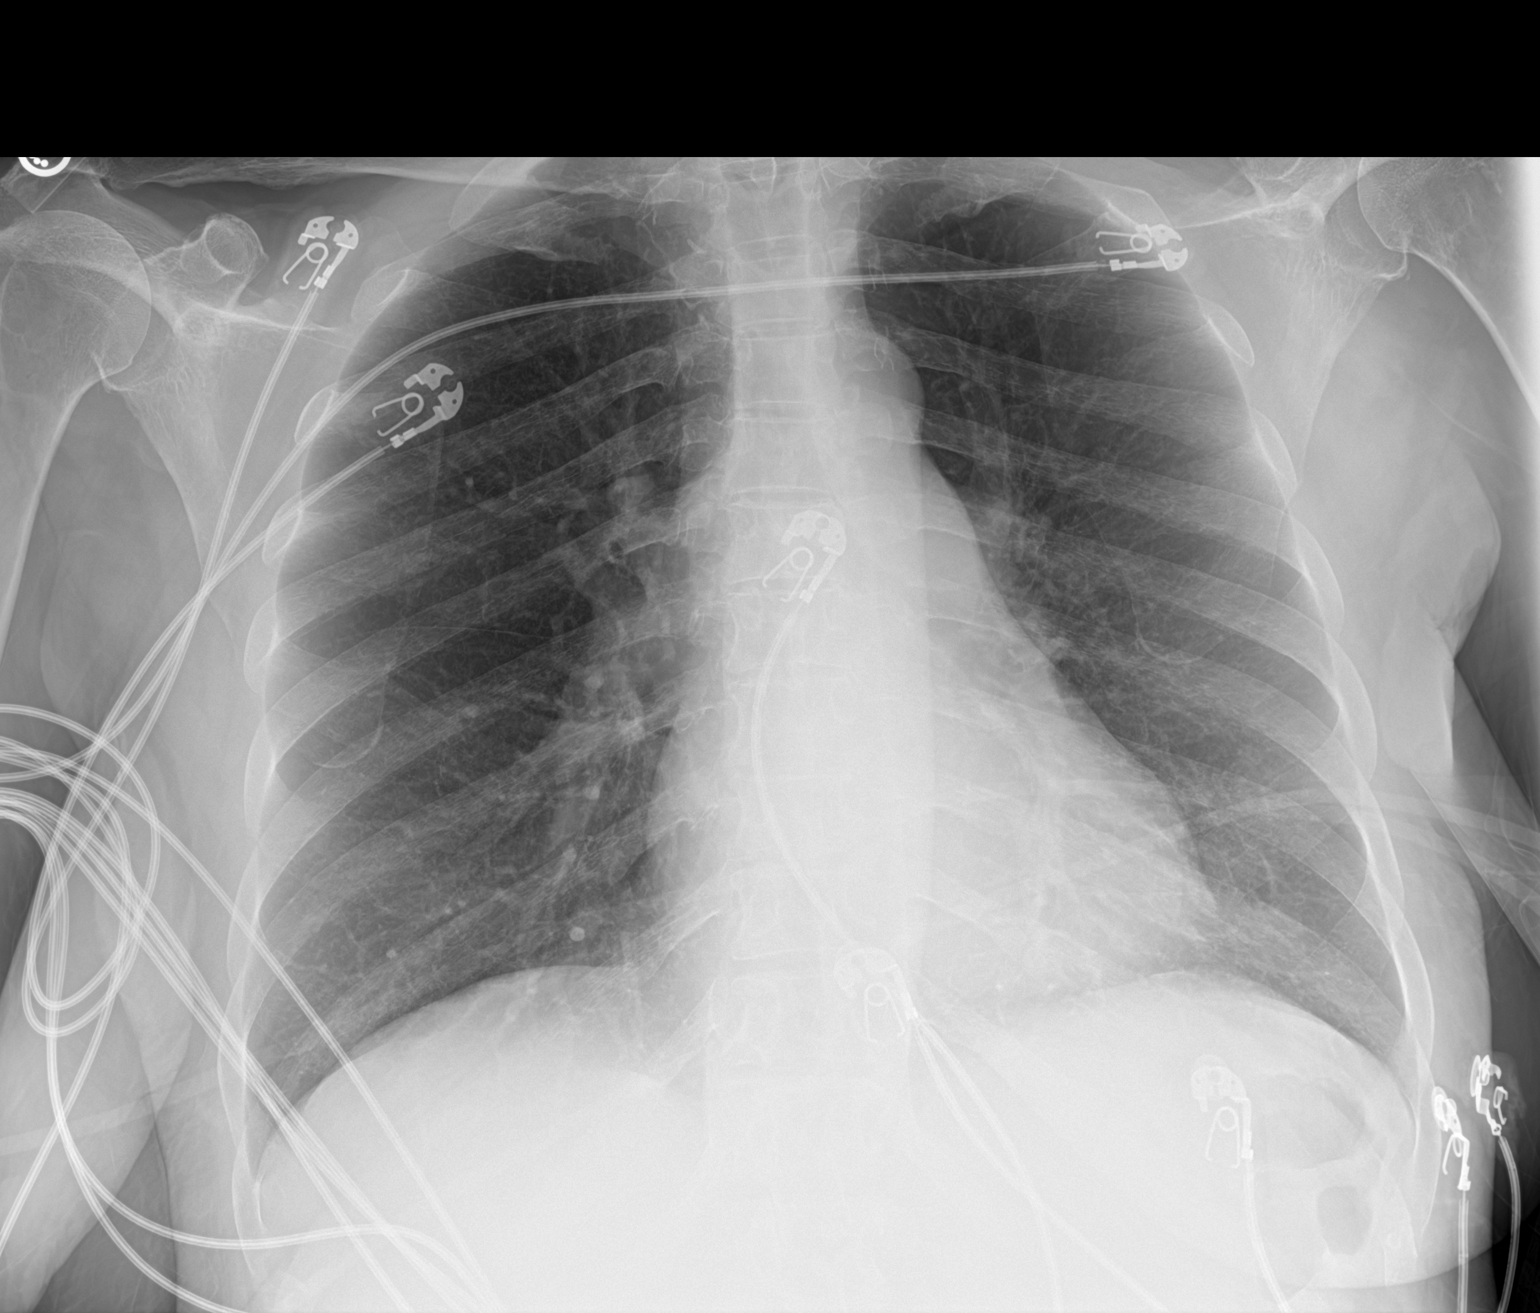

[1 of 1 positions shown; findings below may reference images not displayed]

FINDINGS: The heart size and mediastinal contours are within normal limits.
Both lungs are clear. The visualized skeletal structures are
unremarkable.
IMPRESSION: No acute abnormality of the lungs in AP portable projection.

## 2021-06-29 IMAGING — CT CT ABD-PELV W/O CM
2 of 4 series · 16 of 46 positions shown, 18 images · non-contrast
Comparison: None.

CLINICAL DATA: Abdominal pain for 1 month. Dizziness and weakness
for several days.



[Series 3: ap without · axial · non-contrast · 0.74mm/px · z∈[+338,+714]mm · 13 of 87 slices shown, 15 images]
[im 6/87  soft-tissue]
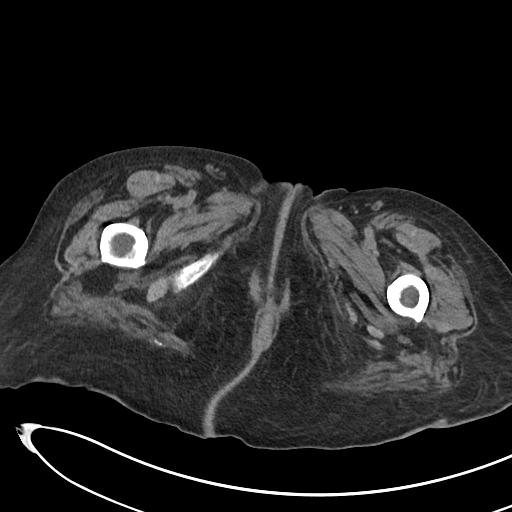
[im 6/87  bone]
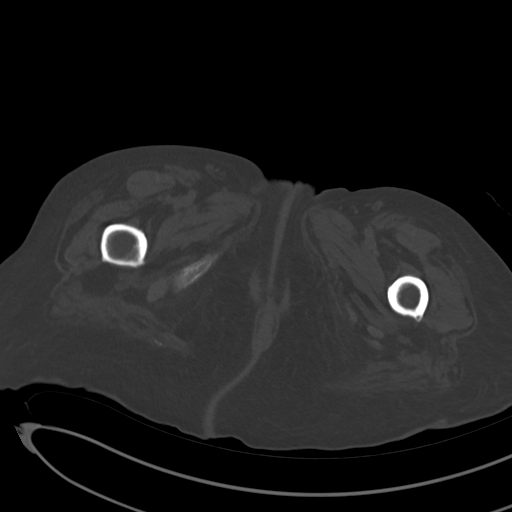
[im 11/87  soft-tissue]
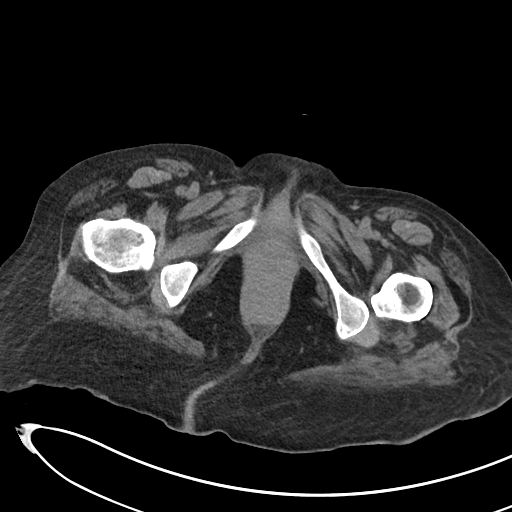
[im 21/87  soft-tissue]
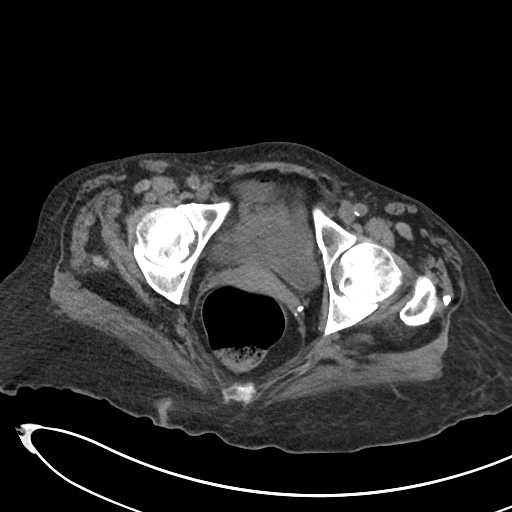
[im 26/87  soft-tissue]
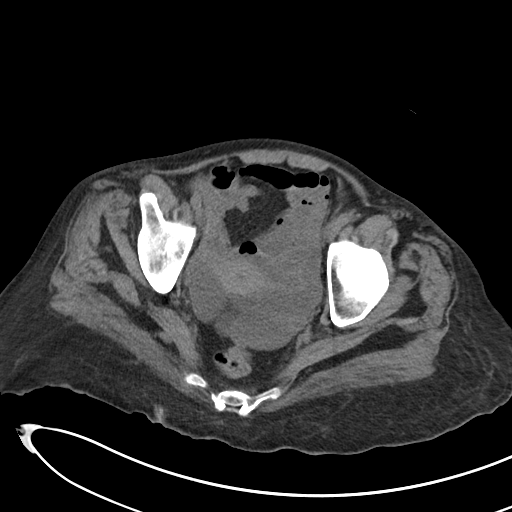
[im 31/87  soft-tissue]
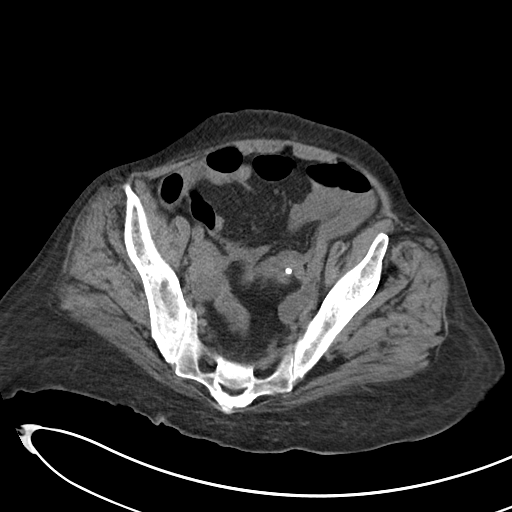
[im 36/87  soft-tissue]
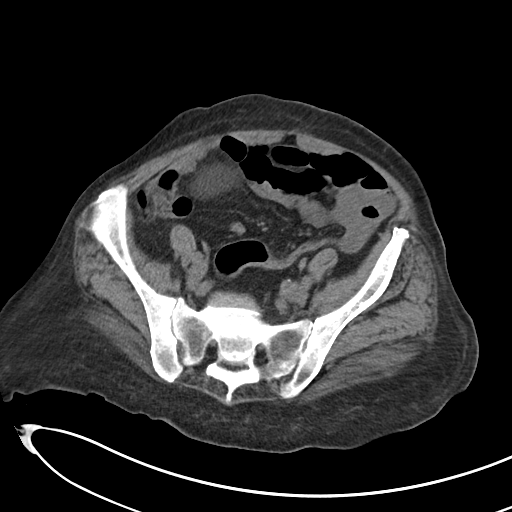
[im 46/87  soft-tissue]
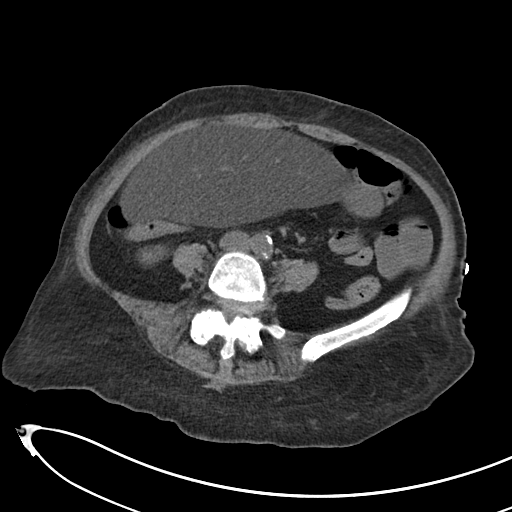
[im 51/87  soft-tissue]
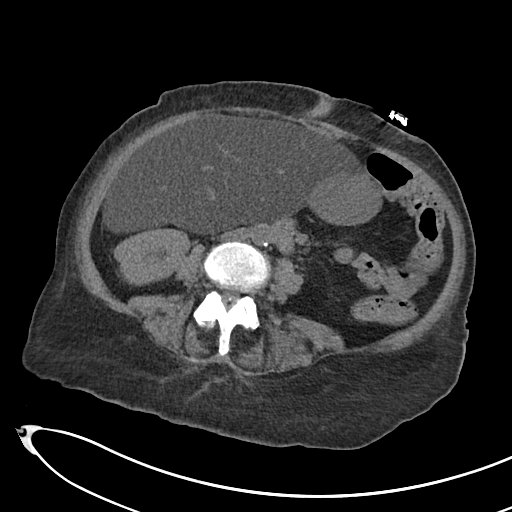
[im 56/87  soft-tissue]
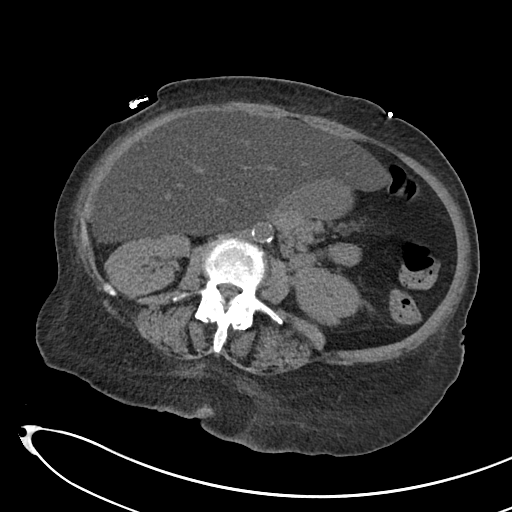
[im 56/87  bone]
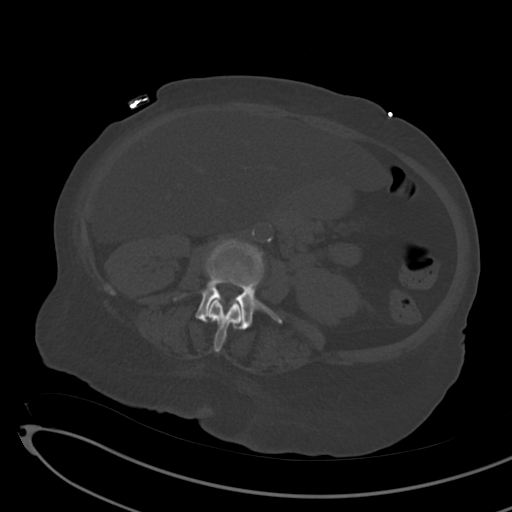
[im 61/87  soft-tissue]
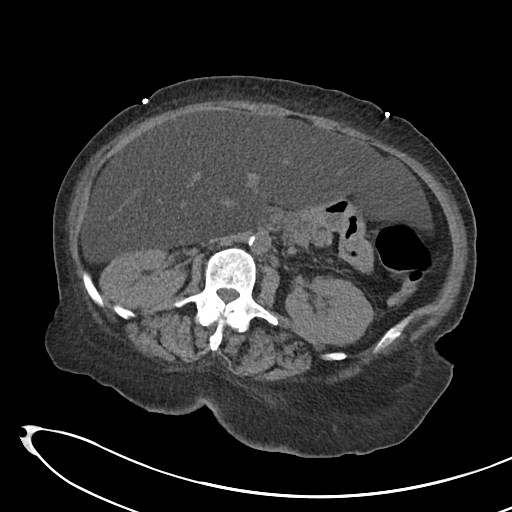
[im 66/87  soft-tissue]
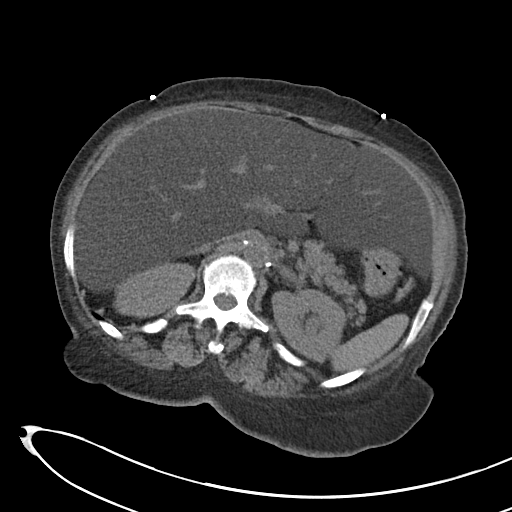
[im 76/87  soft-tissue]
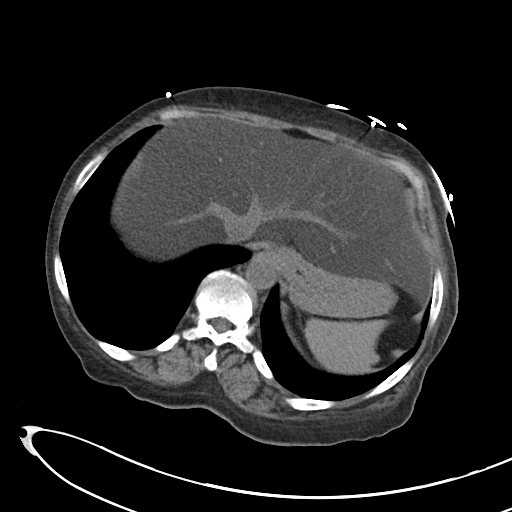
[im 81/87  soft-tissue]
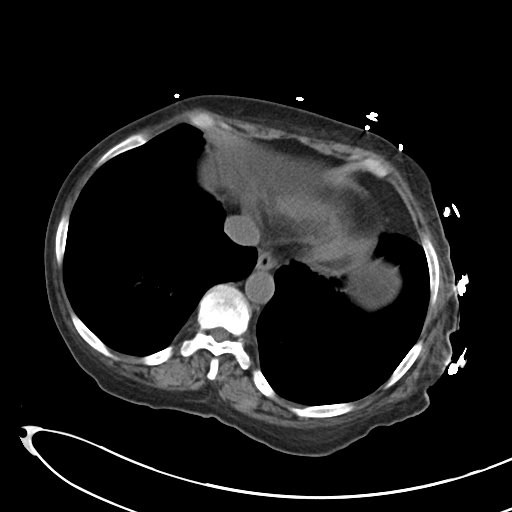

[Series 6: cor · coronal · 0.68mm/px · 3 of 93 slices shown]
[im 31/93  soft-tissue]
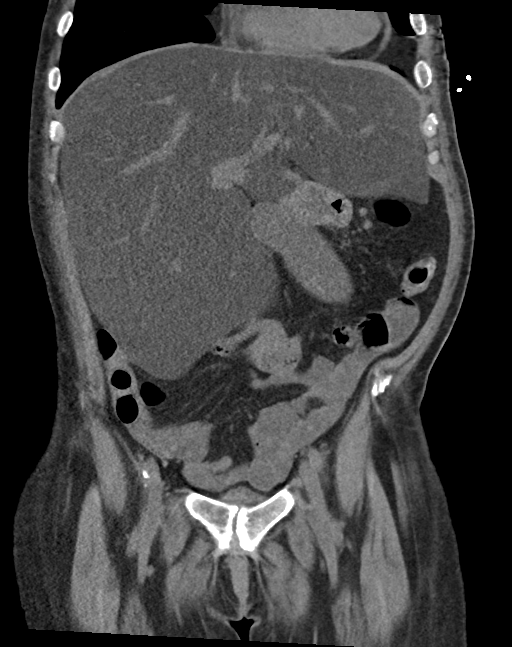
[im 41/93  soft-tissue]
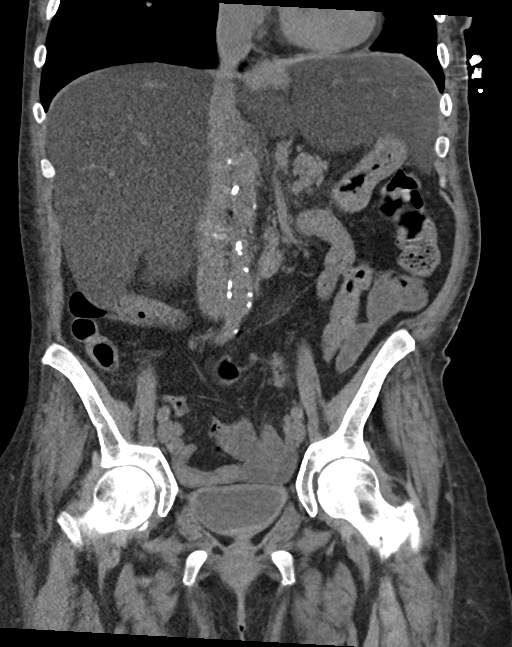
[im 52/93  soft-tissue]
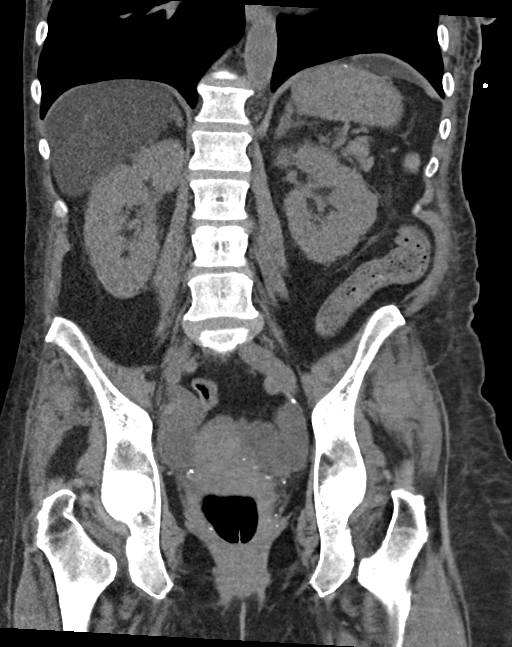

[16 of 46 positions shown; findings below may reference images not displayed]

FINDINGS: Lower chest: No acute findings.

Hepatobiliary: Severe diffuse hepatic steatosis and hepatomegaly
noted. No mass visualized on this unenhanced exam. Gallbladder is
unremarkable. No evidence of biliary ductal dilatation.

Pancreas: No mass or inflammatory process visualized on this
unenhanced exam.

Spleen:  Within normal limits in size.

Adrenals/Urinary tract: No evidence of urolithiasis or
hydronephrosis. Unremarkable unopacified urinary bladder.

Stomach/Bowel: No evidence of obstruction, inflammatory process, or
abnormal fluid collections. Normal appendix visualized.

Vascular/Lymphatic: No pathologically enlarged lymph nodes
identified. No evidence of abdominal aortic aneurysm. Aortic
atherosclerotic calcification noted.

Reproductive:  No mass or other significant abnormality.

Other:  None.

Musculoskeletal:  No suspicious bone lesions identified.

:
Severe hepatic steatosis and hepatomegaly.  No other acute findings.

## 2021-06-29 MED ORDER — VANCOMYCIN HCL 1750 MG/350ML IV SOLN
1750.0000 mg | INTRAVENOUS | Status: DC
  Administered 2021-06-30: 1750 mg via INTRAVENOUS
  Filled 2021-06-29: qty 350

## 2021-06-29 MED ORDER — METRONIDAZOLE 500 MG/100ML IV SOLN
500.0000 mg | Freq: Once | INTRAVENOUS | Status: AC
  Administered 2021-06-29: 500 mg via INTRAVENOUS
  Filled 2021-06-29: qty 100

## 2021-06-29 MED ORDER — ONDANSETRON HCL 4 MG/2ML IJ SOLN: 4.0000 mg | Freq: Four times a day (QID) | INTRAMUSCULAR | Status: DC | PRN

## 2021-06-29 MED ORDER — VANCOMYCIN HCL 1750 MG/350ML IV SOLN
1750.0000 mg | Freq: Once | INTRAVENOUS | Status: AC
  Administered 2021-06-29: 1750 mg via INTRAVENOUS
  Filled 2021-06-29: qty 350

## 2021-06-29 MED ORDER — LACTATED RINGERS IV BOLUS
2000.0000 mL | Freq: Once | INTRAVENOUS | Status: AC
Start: 2021-06-29 — End: 2021-06-29
  Administered 2021-06-29: 2000 mL via INTRAVENOUS

## 2021-06-29 MED ORDER — ENOXAPARIN SODIUM 40 MG/0.4ML IJ SOSY
40.0000 mg | PREFILLED_SYRINGE | INTRAMUSCULAR | Status: DC
  Administered 2021-06-30 – 2021-07-01 (×2): 40 mg via SUBCUTANEOUS
  Filled 2021-06-29 (×3): qty 0.4

## 2021-06-29 MED ORDER — MAGNESIUM SULFATE 2 GM/50ML IV SOLN
2.0000 g | Freq: Once | INTRAVENOUS | Status: AC
Start: 2021-06-29 — End: 2021-06-29
  Administered 2021-06-29: 2 g via INTRAVENOUS
  Filled 2021-06-29: qty 50

## 2021-06-29 MED ORDER — ONDANSETRON HCL 4 MG PO TABS: 4.0000 mg | ORAL_TABLET | Freq: Four times a day (QID) | ORAL | Status: DC | PRN

## 2021-06-29 MED ORDER — ENOXAPARIN SODIUM 40 MG/0.4ML IJ SOSY: 40.0000 mg | PREFILLED_SYRINGE | INTRAMUSCULAR | Status: DC

## 2021-06-29 MED ORDER — POTASSIUM CHLORIDE 10 MEQ/100ML IV SOLN
10.0000 meq | INTRAVENOUS | Status: AC
  Administered 2021-06-29 (×3): 10 meq via INTRAVENOUS
  Filled 2021-06-29 (×3): qty 100

## 2021-06-29 MED ORDER — LACTATED RINGERS IV SOLN: INTRAVENOUS | Status: AC

## 2021-06-29 MED ORDER — ACETAMINOPHEN 650 MG RE SUPP: 650.0000 mg | Freq: Four times a day (QID) | RECTAL | Status: DC | PRN

## 2021-06-29 MED ORDER — VANCOMYCIN HCL IN DEXTROSE 1-5 GM/200ML-% IV SOLN: 1000.0000 mg | Freq: Once | INTRAVENOUS | Status: DC

## 2021-06-29 MED ORDER — ACETAMINOPHEN 325 MG PO TABS: 650.0000 mg | ORAL_TABLET | Freq: Four times a day (QID) | ORAL | Status: DC | PRN

## 2021-06-29 MED ORDER — LACTATED RINGERS IV BOLUS (SEPSIS)
1000.0000 mL | Freq: Once | INTRAVENOUS | Status: AC
  Administered 2021-06-29: 1000 mL via INTRAVENOUS

## 2021-06-29 MED ORDER — SODIUM CHLORIDE 0.9 % IV SOLN
2.0000 g | Freq: Once | INTRAVENOUS | Status: AC
  Administered 2021-06-29: 2 g via INTRAVENOUS
  Filled 2021-06-29: qty 2

## 2021-06-29 MED ORDER — SODIUM CHLORIDE 0.9 % IV SOLN: 10.0000 mL/h | Freq: Once | INTRAVENOUS | Status: DC

## 2021-06-29 MED ORDER — OXYCODONE-ACETAMINOPHEN 5-325 MG PO TABS
1.0000 | ORAL_TABLET | Freq: Three times a day (TID) | ORAL | Status: DC | PRN
Start: 2021-06-29 — End: 2021-07-02
  Administered 2021-07-01 – 2021-07-02 (×2): 1 via ORAL
  Filled 2021-06-29 (×2): qty 1

## 2021-06-29 MED ORDER — CALCIUM GLUCONATE-NACL 2-0.675 GM/100ML-% IV SOLN
2.0000 g | Freq: Once | INTRAVENOUS | Status: AC
  Administered 2021-06-29: 2000 mg via INTRAVENOUS
  Filled 2021-06-29: qty 100

## 2021-06-29 MED ORDER — SODIUM CHLORIDE 0.9 % IV SOLN
2.0000 g | Freq: Three times a day (TID) | INTRAVENOUS | Status: DC
  Administered 2021-06-30 – 2021-07-01 (×4): 2 g via INTRAVENOUS
  Filled 2021-06-29 (×6): qty 2

## 2021-06-29 NOTE — ED Notes (Signed)
2nd unit PRBC started at 120 ml/hr ,IV sites intact , afebrile , denies pain/no adverse effect.  ?

## 2021-06-29 NOTE — ED Notes (Signed)
1st unit PRBC completed with no adverse effect .  

## 2021-06-29 NOTE — Sepsis Progress Note (Signed)
Sepsis protocol is being followed by [facility]. 

## 2021-06-29 NOTE — Progress Notes (Signed)
Pharmacy Antibiotic Note ? ?Laura [name] is a 61 y.o. adult admitted on 06/29/2021 with sepsis.  Pharmacy has been consulted for aztreonam and vancomycin dosing. ? ?Patient with a history of tobacco abuse, HTN, Obesity, folic acid deficiency. Patient presenting with hypotension and weakness. ? ?SCr 0.56 - at baseline ?WBC 7.2; LA 4.3; T 99.4 F; HR 79; RR 17 ?COVID/Flu neg ? ?Plan: ?Flagyl per MD ?Aztreonam 2g q8h ?Vancomycin 1750 q24h unless change in renal function ?Trend WBC, Fever, Renal function, & Clinical course ?F/u cultures, clinical course, WBC, fever ?De-escalate when able ?Levels at steady state ? ?Height: 5\' 5"  (165.1 cm) ?Weight: 86 kg (189 lb 9.5 oz) ?IBW/kg (Calculated) : 57 ? ?Temp (24hrs), Avg:99.4 ?F (37.4 ?C), Min:99.4 ?F (37.4 ?C), Max:99.4 ?F (37.4 ?C) ? ?Recent Labs  ?Lab 06/29/21 ?1607 06/29/21 ?1611  ?CREATININE  --  0.40*  ?LATICACIDVEN 4.0*  --   ?  ?Estimated Creatinine Clearance (by C-G formula based on SCr of 0.4 mg/dL (L)) ?Female: 83 mL/min (A) ?Female: 101.5 mL/min (A)   ? ?Allergies  ?Allergen Reactions  ? Aspirin Hives  ? Lyrica [Pregabalin]   ?  Nightmares, did not help neuropathy  ? Penicillins Hives  ? ? ?Antimicrobials this admission: ?aztreonam 3/12 >>  ?metronidazole 3/12 >>  ?vancomycin 3/12 >>  ? ?Microbiology results: ?Pending ? ?Thank you for allowing pharmacy to be a part of this patient?s care. ? ?Lorelei Pont, PharmD, BCPS ?06/29/2021 5:20 PM ?ED Clinical Pharmacist -  [phone] ? ? ?

## 2021-06-29 NOTE — Sepsis Progress Note (Addendum)
Notified bedside nurse of need to draw repeat lactic acid. Bedside RN said he is unable to collect lactic acid due to blood products being transfused at this time. Will continue to monitor code sepsis.  ?

## 2021-06-29 NOTE — ED Provider Notes (Signed)
[facility]ENCY [facility] Provider Note   CSN: [id number] Arrival date & time: 06/29/21  1518     History  Chief Complaint  Patient presents with   Weakness   Hypotension    Laura KiefKathy [name] is a 61 y.o. adult.  HPI 61 year old female history of hypertension, recent diarrhea for several weeks presents today with weakness and hypotension.  Patient reports that she has been treated for diarrhea.  She has been on the most recent antibiotic for 4 to 5 days.  She has had increasing weakness and has passed out one time.  Due to her increased weakness today EMS was called.  EMS reports a systolic blood pressure of 80/40 on their arrival.  She received 350 cc of saline prehospital before IV infiltrated.  Patient denies headache, head injury, any injury from falling, chest pain, dyspnea, abdominal pain, fever or chills.  She endorses that she has had nausea and some vomiting and multiple episodes of diarrhea.  She is not specific in her description of her stool.  She denies any bright red blood per rectum or dark tarry stool.    Home Medications Prior to Admission medications   Medication Sig Start Date End Date Taking? Authorizing Provider  albuterol (VENTOLIN HFA) 108 (90 Base) MCG/ACT inhaler INHALE 2 PUFFS INTO THE LUNGS EVERY 6 HOURS AS NEEDED FOR WHEEZING OR SHORTNESS OF BREATH 05/19/19   Sharon SellerEubanks, Jessica K, NP  calcium carbonate (CALCIUM 600) 600 MG TABS tablet Take 1 tablet (600 mg total) by mouth 2 (two) times daily with a meal. 01/06/21   Sharon SellerEubanks, Jessica K, NP  diphenhydrAMINE (BENADRYL) 25 MG tablet Take 25 mg by mouth as needed.    [provider]  folic acid (FOLVITE) 1 MG tablet Take 2 tablets (2 mg total) by mouth daily. 02/08/20   Sharon SellerEubanks, Jessica K, NP  losartan (COZAAR) 25 MG tablet TAKE 1 TABLET(25 MG) BY MOUTH DAILY 07/24/19   Sharon SellerEubanks, Jessica K, NP  oxyCODONE-acetaminophen (PERCOCET/ROXICET) 5-325 MG tablet Take one tablet by mouth every 8-12 hours  as needed for pain. 06/19/21   Sharon SellerEubanks, Jessica K, NP  potassium chloride (KLOR-CON) 20 MEQ tablet Take 1 tablet (20 mEq total) by mouth daily. 01/06/21   Sharon SellerEubanks, Jessica K, NP  trimethoprim-polymyxin b (POLYTRIM) ophthalmic solution Place 1 drop into both eyes QID. 01/03/21   Sharon SellerEubanks, Jessica K, NP      Allergies    Aspirin, Lyrica [pregabalin], and Penicillins    Review of Systems   Review of Systems  All other systems reviewed and are negative.  Physical Exam Updated Vital Signs BP (!) 99/57    Pulse 70    Temp 99.4 F (37.4 C) (Rectal)    Resp 11    Ht 1.651 m (5\' 5" )    Wt 86 kg    SpO2 100%    BMI 31.55 kg/m  Physical Exam Vitals and nursing note reviewed.  Constitutional:      General: He is not in acute distress.    Appearance: Normal appearance. He is well-developed. He is ill-appearing.  HENT:     Head: Normocephalic and atraumatic.     Right Ear: External ear normal.     Left Ear: External ear normal.     Nose: Nose normal.     Mouth/Throat:     Mouth: Mucous membranes are dry.     Pharynx: Oropharynx is clear.  Eyes:     Conjunctiva/sclera: Conjunctivae normal.     Pupils: Pupils are  equal, round, and reactive to light.  Neck:     Thyroid: No thyromegaly.     Vascular: No JVD.     Trachea: No tracheal deviation.  Cardiovascular:     Rate and Rhythm: Normal rate and regular rhythm.     Pulses: Normal pulses.     Heart sounds: Normal heart sounds.  Pulmonary:     Effort: Pulmonary effort is normal.     Breath sounds: Normal breath sounds. No wheezing.  Abdominal:     General: Abdomen is flat. Bowel sounds are normal.     Palpations: Abdomen is soft. There is no mass.     Tenderness: There is abdominal tenderness. There is no guarding.     Comments: Mild diffuse tenderness to palpation  Genitourinary:    Comments: External genitalia consistent with female Rectal exam reveals mild amount of dark stool No obvious bleeding noted Musculoskeletal:         General: Normal range of motion.     Cervical back: Normal range of motion and neck supple.  Lymphadenopathy:     Cervical: No cervical adenopathy.  Skin:    General: Skin is warm and dry.  Neurological:     Mental Status: He is alert and oriented to person, place, and time.     GCS: GCS eye subscore is 4. GCS verbal subscore is 5. GCS motor subscore is 6.     Cranial Nerves: No cranial nerve deficit.     Sensory: No sensory deficit.     Motor: No abnormal muscle tone.     Coordination: Coordination normal.     Gait: Gait normal.     Deep Tendon Reflexes: Reflexes are normal and symmetric. Babinski sign absent on the right side. Babinski sign absent on the left side.     Reflex Scores:      Bicep reflexes are 2+ on the right side and 2+ on the left side.      Patellar reflexes are 2+ on the right side and 2+ on the left side.    Comments: Strength is normal and equal throughout. Cranial nerves grossly intact. Patient fluent. No gross ataxia and patient able to ambulate without difficulty.  Psychiatric:        Behavior: Behavior normal.        Thought Content: Thought content normal.        Judgment: Judgment normal.    ED Results / Procedures / Treatments   Labs (all labs ordered are listed, but only abnormal results are displayed) Labs Reviewed  LACTIC ACID, PLASMA - Abnormal; Notable for the following components:      Result Value   Lactic Acid, Venous 4.0 (*)    All other components within normal limits  LACTIC ACID, PLASMA - Abnormal; Notable for the following components:   Lactic Acid, Venous 4.3 (*)    All other components within normal limits  CBC - Abnormal; Notable for the following components:   RBC 1.43 (*)    Hemoglobin 5.8 (*)    HCT 17.8 (*)    MCV 124.5 (*)    MCH 40.6 (*)    nRBC 0.3 (*)    All other components within normal limits  I-STAT CHEM 8, ED - Abnormal; Notable for the following components:   Potassium 2.6 (*)    Chloride 94 (*)    BUN <3 (*)     Creatinine, Ser 0.40 (*)    Calcium, Ion 0.75 (*)    Hemoglobin 7.5 (*)  HCT 22.0 (*)    All other components within normal limits  RESP PANEL BY RT-PCR (FLU A&B, COVID) ARPGX2  GASTROINTESTINAL PANEL BY PCR, STOOL (REPLACES STOOL CULTURE)  C DIFFICILE QUICK SCREEN W PCR REFLEX    CULTURE, BLOOD (SINGLE)  PROTIME-INR  MAGNESIUM  CALCIUM, IONIZED  CALCIUM, IONIZED  COMPREHENSIVE METABOLIC PANEL  POC OCCULT BLOOD, ED  PREPARE RBC (CROSSMATCH)  TYPE AND SCREEN  TROPONIN I (HIGH SENSITIVITY)    EKG EKG Interpretation  Date/Time:  Sunday June 29 2021 15:26:49 EDT Ventricular Rate:  76 PR Interval:  112 QRS Duration: 84 QT Interval:  456 QTC Calculation: 513 R Axis:   71 Text Interpretation: Sinus or ectopic atrial rhythm Ventricular premature complex Borderline short PR interval Nonspecific repol abnormality, diffuse leads Prolonged QT interval Confirmed by Margarita Grizzle [phone]) on 06/29/2021 3:32:07 PM  Radiology CT ABDOMEN PELVIS WO CONTRAST  Result Date: 06/29/2021 CLINICAL DATA:  Abdominal pain for 1 month. Dizziness and weakness for several days. EXAM: CT ABDOMEN AND PELVIS WITHOUT CONTRAST TECHNIQUE: Multidetector CT imaging of the abdomen and pelvis was performed following the standard protocol without IV contrast. RADIATION DOSE REDUCTION: This exam was performed according to the departmental dose-optimization program which includes automated exposure control, adjustment of the mA and/or kV according to patient size and/or use of iterative reconstruction technique. COMPARISON:  None. FINDINGS: Lower chest: No acute findings. Hepatobiliary: Severe diffuse hepatic steatosis and hepatomegaly noted. No mass visualized on this unenhanced exam. Gallbladder is unremarkable. No evidence of biliary ductal dilatation. Pancreas: No mass or inflammatory process visualized on this unenhanced exam. Spleen:  Within normal limits in size. Adrenals/Urinary tract: No evidence of  urolithiasis or hydronephrosis. Unremarkable unopacified urinary bladder. Stomach/Bowel: No evidence of obstruction, inflammatory process, or abnormal fluid collections. Normal appendix visualized. Vascular/Lymphatic: No pathologically enlarged lymph nodes identified. No evidence of abdominal aortic aneurysm. Aortic atherosclerotic calcification noted. Reproductive:  No mass or other significant abnormality. Other:  None. Musculoskeletal:  No suspicious bone lesions identified. : Severe hepatic steatosis and hepatomegaly.  No other acute findings. Electronically Signed   By: Danae Orleans M.D.   On: 06/29/2021 16:31   DG Chest Port 1 View  Result Date: 06/29/2021 CLINICAL DATA:  Weakness EXAM: PORTABLE CHEST 1 VIEW COMPARISON:  08/13/2017 FINDINGS: The heart size and mediastinal contours are within normal limits. Both lungs are clear. The visualized skeletal structures are unremarkable. IMPRESSION: No acute abnormality of the lungs in AP portable projection. Electronically Signed   By: Jearld Lesch M.D.   On: 06/29/2021 16:18    Procedures Procedures    Medications Ordered in ED Medications  0.9 %  sodium chloride infusion (has no administration in time range)  potassium chloride 10 mEq in 100 mL IVPB (10 mEq Intravenous New Bag/Given 06/29/21 1709)  magnesium sulfate IVPB 2 g 50 mL (2 g Intravenous New Bag/Given 06/29/21 1713)  lactated ringers infusion (has no administration in time range)  lactated ringers bolus 1,000 mL (1,000 mLs Intravenous New Bag/Given 06/29/21 1733)  aztreonam (AZACTAM) 2 g in sodium chloride 0.9 % 100 mL IVPB (2 g Intravenous New Bag/Given 06/29/21 1739)  metroNIDAZOLE (FLAGYL) IVPB 500 mg (has no administration in time range)  vancomycin (VANCOREADY) IVPB 1750 mg/350 mL (has no administration in time range)  lactated ringers bolus 2,000 mL (0 mLs Intravenous Stopped 06/29/21 1724)  calcium gluconate 2 g/ 100 mL sodium chloride IVPB (0 mg Intravenous Stopped 06/29/21 1732)     ED Course/ Medical Decision Making/ A&P Clinical  Course as of 06/29/21 1805  Sun Jun 29, 2021  1531 I-STAT reviewed and significant for anemia with hemoglobin of 7.5 Potassium low at 2.6 2 units packed red blood cells ordered IV potassium ordered [DR]  1714 Lactic acid reviewed and elevated at 4.0 [DR]  1716 CT abdomen and pelvis personally reviewed and interpreted with no acute findings noted and radiologist reading reviewed and concurs [DR]  1717 X-Grae Leathers reviewed and interpreted shows no evidence of acute abnormality, radiologist reading reviewed and concurs [DR]    Clinical Course User Index [DR] Margarita Grizzle, MD                           Medical Decision Making 61 year old patient presents today with multiple weeks of diarrhea, generalized weakness, hypotension.  On exam patient has pale conjunctive a and dark stool however stool is heme-negative.  He has diffuse abdominal tenderness to palpation. CT obtained without acute changes Hypotension treated with iv fluid- 2 liters given, third ordered. Patient found to be anemic with hgb of 7.5- hemocult negative, but very little stool obtained, would still suspect gi as source.  2 units of packed red blood cells are ordered and should be infusing.  Discussed with patient and remitted for blood transfusion Lactic acid elevated, suspect GI source of infection.  Broad-spectrum antibiotics given here in ED 1 hypotension broad differential diagnosis including sepsis, volume depletion, bleeding, or metabolic abnormalities, including reactive etiology 2 patient with decreased hemoglobin-suspect GI as source, blood being transfused-patient with prior known history of anemia but last hemoglobin checked was 10 3 sepsis considered as part of etiology of #1 above, patient had blood cultures, fluid bolus, initial elevated lactic acid and broad-spectrum antibiotics being given 4 significant multiple electrolyte abnormalities, including hypokalemia,  hypocalcemia, and presumed hypomagnesemia all being repleted. 5 EKG with nonspecific ST changes likely secondary to hypotension and anemia.   Amount and/or Complexity of Data Reviewed External Data Reviewed: notes.    Details: Reviewed outpatient provider note from 30 patient diagnosed with gastroenteritis and started on probiotics and Flagyl. Labs: ordered. Decision-making details documented in ED Course. Radiology: ordered and independent interpretation performed. Decision-making details documented in ED Course. ECG/medicine tests: ordered and independent interpretation performed. Decision-making details documented in ED Course. Discussion of management or test interpretation with external provider(s): Plan consult with critical care Discussed care with Delorise Shiner who will be in to see and evaluate 5:43 PM Critical care at bedside and patient has improved blood pressures at 99/57. Advised that hospitalist team can admit Hospitalist team has been paged Discussed care with Dr. Luberta Robertson and she will see for admission  Risk Prescription drug management. Decision regarding hospitalization.  Critical Care Total time providing critical care: 75 minutes          Final Clinical Impression(s) / ED Diagnoses Final diagnoses:  Hypotension, unspecified hypotension type  Anemia, unspecified type  Hypokalemia    Rx / DC Orders ED Discharge Orders     None         Margarita Grizzle, MD 06/29/21 1805

## 2021-06-29 NOTE — ED Notes (Signed)
Blood consent signed at bedside.  ?

## 2021-06-29 NOTE — Sepsis Progress Note (Signed)
Notified provider of need to order repeat lactic acid. ° °

## 2021-06-29 NOTE — ED Triage Notes (Signed)
Pt arrives from home via EMS with complaints of weakness and dizziness upon standing and weakness. This has been ongoing for about 3 days. Pt has been having abdominal pain as well for about a month. Pt orthostatic upon arrival. BP 84/49 upon arrival. EMS administered 350 mls of fluid. NAD at this time ?

## 2021-06-29 NOTE — H&P (Signed)
History and Physical    Patient: Laura [name] MVH:[id number] DOB: 17-Aug-1960 DOA: 06/29/2021 DOS: the patient was seen and examined on 06/29/2021 PCP: Sharon Seller, NP  Patient coming from: Home  Chief Complaint:  Chief Complaint  Patient presents with   Weakness   Hypotension   HPI: Laura [name] is a 61 y.o. adult with medical history significant of HTN who was in his usual state of reasonable health until mid November 2022 when he developed diarrhea.  Patient is unable to provide a specific history as he does not keep track of dates.  He states he initially developed nausea and vomiting "I had to keep a bucket beside my bed" in mid November but then subsequently progressed to having watery diarrhea.  He states that the vomiting subsided once the diarrhea started.  Notes that the diarrhea waxes and wanes but has been present over the past 3 to 4 months.  Patient is not sure if the diarrhea is worse when he eats however he has cut down on his p.o. intake and is only drinking chicken broth and meal replacement drinks.  He thinks he has lost weight but is not sure how much "my close fall off me".  Patient notes he gets up multiple times at night with diarrhea.  On bad days he has diarrhea 5-7 times a day, on better days he has diarrhea 2-3 times a day.  Patient was seen by his PCP last month and was treated with Flagyl with no effect.  Patient has been feeling weak and tired with difficulty ambulating for the past 2 to 3 weeks.  2 days ago he had an episode of syncope, did not hit his head, does not know how long he was out but there was family around and he was not confused when he came to.  Today he noted he was unable to walk back and forth to the bathroom without being severely exhausted.  Patient has not been started on any new medications, denies any travel out of the country and states that he only drinks bottled water.  Patient denies alcohol use.  Patient denies any blood in the stool.   Notes that it is watery, yellow or brown.  Did have 1 episode of bright red blood per rectum on the toilet paper but never in the toilet.  He attributed this to raw anal skin.  Patient's initial blood pressures were 80s over 40s with initial hemoglobin of 7.8 which decreased to 5.8 with hydration.  Stool guaiac was negative although there was not much stool in the rectal vault he had multiple electrolyte abnormalities including hypokalemia, hypomagnesemia and hypocalcemia.  He was treated with IV fluid resuscitation, aggressive electrolyte repletion and initiation of transfusion.  Review of Systems: As mentioned in the history of present illness. All other systems reviewed and are negative. Past Medical History:  Diagnosis Date   Acute bronchitis    Cervical high risk HPV (human papillomavirus) test positive 12/2017   Negative subtype 16, 18/45   Chronic back pain    Complication of anesthesia    "didn't wake up"   Cough    Insomnia, unspecified    Other malaise and fatigue    Other specified diseases of blood and blood-forming organs(289.89)    Tobacco use disorder    Past Surgical History:  Procedure Laterality Date   ESOPHAGOGASTRODUODENOSCOPY N/A 03/17/2013   Procedure: ESOPHAGOGASTRODUODENOSCOPY (EGD);  Surgeon: Meryl Dare, MD;  Location: [facility] ENDOSCOPY;  Service: Endoscopy;  Laterality: N/A;  FRACTURE SURGERY     Laminectomy and foraminotomy at L5-S1    12/12/2019   TONSILLECTOMY     Social History:  reports that he has been smoking cigarettes. He has a 6.00 pack-year smoking history. He has never used smokeless tobacco. He reports that he does not currently use alcohol. He reports that he does not use drugs.  Allergies  Allergen Reactions   Aspirin Hives   Lyrica [Pregabalin]     Nightmares, did not help neuropathy   Penicillins Hives    Family History  Problem Relation Age of Onset   Arthritis Mother    Hypertension Sister    Hypertension Brother    Hypertension  Sister    Hypertension Sister    Hypertension Sister     Prior to Admission medications   Medication Sig Start Date End Date Taking? Authorizing Provider  albuterol (VENTOLIN HFA) 108 (90 Base) MCG/ACT inhaler INHALE 2 PUFFS INTO THE LUNGS EVERY 6 HOURS AS NEEDED FOR WHEEZING OR SHORTNESS OF BREATH 05/19/19   Sharon SellerEubanks, Jessica K, NP  calcium carbonate (CALCIUM 600) 600 MG TABS tablet Take 1 tablet (600 mg total) by mouth 2 (two) times daily with a meal. 01/06/21   Sharon SellerEubanks, Jessica K, NP  diphenhydrAMINE (BENADRYL) 25 MG tablet Take 25 mg by mouth as needed.    [provider]  folic acid (FOLVITE) 1 MG tablet Take 2 tablets (2 mg total) by mouth daily. 02/08/20   Sharon SellerEubanks, Jessica K, NP  losartan (COZAAR) 25 MG tablet TAKE 1 TABLET(25 MG) BY MOUTH DAILY 07/24/19   Sharon SellerEubanks, Jessica K, NP  oxyCODONE-acetaminophen (PERCOCET/ROXICET) 5-325 MG tablet Take one tablet by mouth every 8-12 hours as needed for pain. 06/19/21   Sharon SellerEubanks, Jessica K, NP  potassium chloride (KLOR-CON) 20 MEQ tablet Take 1 tablet (20 mEq total) by mouth daily. 01/06/21   Sharon SellerEubanks, Jessica K, NP  trimethoprim-polymyxin b (POLYTRIM) ophthalmic solution Place 1 drop into both eyes QID. 01/03/21   Sharon SellerEubanks, Jessica K, NP    Physical Exam: Vitals:   06/29/21 1710 06/29/21 1720 06/29/21 1730 06/29/21 1816  BP: 96/71 110/70 (!) 99/57 110/75  Pulse: 74 72 70 79  Resp: 19 17 11 17   Temp:    98.5 F (36.9 C)  TempSrc:    Oral  SpO2: 99% 98% 100%   Weight:      Height:       Physical Exam: Blood pressure (!) 103/57, pulse 79, temperature 98.3 F (36.8 C), temperature source Oral, resp. rate 15, height 5\' 5"  (1.651 m), weight 86 kg, SpO2 97 %. Gen: Patient lying in bed appearing relatively comfortable talking at length on the phone with friend. Eyes: sclera anicteric, conjuctiva mildly injected bilaterally CVS: S1-S2, regulary, no gallops Respiratory:  decreased air entry likely secondary to decreased inspiratory effort GI:  Somewhat distended, firm but not hard, NABS, no tenderness to light or deep palpation throughout. LE: No edema. No cyanosis Neuro: A/O x 3, Moving all extremities equally with normal strength, CN 3-12 intact, grossly nonfocal.  Psych: patient is logical and coherent, judgement and insight appear normal, mood and affect appropriate to situation. Skin: no rashes or lesions or ulcers,   Data Reviewed:  As noted above patient had anemia, hypokalemia, hypomagnesemia and hypocalcemia all of which have been aggressively repleted in the ED.  Also of note is patient has a lactate initially of 4.  CT of abdomen and pelvis was essentially unremarkable..  Assessment and Plan: No notes have been filed under  this hospital service. Service: Hospitalist  61 year old female who identifies as female presents with subacute diarrhea x3 to 4 months with resultant hypotension, anemia and multiple electrolyte abnormalities.  Hypotension in setting of elevated lactic acid Likely secondary to chronic diarrhea with decreased p.o. intake I do not think that this is secondary to blood loss She has been started on broad-spectrum antibiotics including aztreonam and vancomycin in the ED which I am continuing, although would have low threshold for stopping the vancomycin.   Will follow lactic acid, I suspect will improve with hydration and resuscitation. Has responded well to 3 L of LR with blood pressure going from 80/40 to 110/60. We will follow as we continue to resuscitate patient with blood and with crystalloids. Hold losartan  Subacute diarrhea for 3 to 4 months Patient with watery diarrhea multiple times a day that occurs both at night and with or without eating, sounds somewhat secretory in nature. Patient has not been started on any new medications, denies any travel out of the country and states that he only drinks bottled water. Abdominal exam is not suggestive of ischemic bowel disease despite elevated  lactate. Continue patient with aztreonam and vancomycin as noted above, would DC vancomycin once concern for sepsis is resolved. I do not think patient needs further Flagyl as she has completed a outpatient course of this without any effect. GI panel is ordered. Patient would benefit from a GI consult in the morning. I have not ordered probiotics, he would likely benefit from this as well.  Anemia Patient has macrocytic anemia, denies alcohol use. Of note she is on folic acid and B12 as an outpatient. Likely some of this is secondary to reticulocytosis Will check for reticulocytes, B12, folate as well as iron studies in the morning Patient is receiving 2 units PRBC tonight. Will repeat H&H in 5 hours and transfuse to hemoglobin greater than 8.  Hypokalemia We will replete and recheck, recheck tonight at 11 PM  Hypomagnesemia Will replete and recheck, recheck tonight at 11 PM  Hypocalcemia Replete and recheck, recheck tonight at 11 PM  HTN Hold losartan, of note patient has continued to take losartan (and took 1 this morning) throughout her course of illness.  Chronic LBP Continue as needed Percocet per home doses.    Advance Care Planning:   Code Status: Prior full code  Consults: None today, GI will need to be consulted in the morning  Family Communication: Patient stated "this is what I have my cell phone for, you do not need to call anybody".  Severity of Illness: The appropriate patient status for this patient is INPATIENT. Inpatient status is judged to be reasonable and necessary in order to provide the required intensity of service to ensure the patient's safety. The patient's presenting symptoms, physical exam findings, and initial radiographic and laboratory data in the context of their chronic comorbidities is felt to place them at high risk for further clinical deterioration. Furthermore, it is not anticipated that the patient will be medically stable for discharge from  the hospital within 2 midnights of admission.   * I certify that at the point of admission it is my clinical judgment that the patient will require inpatient hospital care spanning beyond 2 midnights from the point of admission due to high intensity of service, high risk for further deterioration and high frequency of surveillance required.*  Author: Pieter Partridge, MD 06/29/2021 6:24 PM  For on call review [email or web address].

## 2021-06-29 NOTE — Consult Note (Signed)
? ?NAME:  Jerald KiefKathy Pontillo, MRN:  [id number], DOB:  09-Aug-1960, LOS: 0 ?ADMISSION DATE:  06/29/2021, CONSULTATION DATE:  06/29/21 ?REFERRING MD:  ray - EM, CHIEF COMPLAINT:  hypotension   ? ?History of Present Illness:  ?61 yo M (FTM), PMH tobacco abuse, HTN, Obesity, folic acid deficiency  presented to ED 3/12 with persistent diarrhea, ongoing for over a  month and a half. No blood in stool. Started on flagyl 2/23. Mild generalized abdominal discomfort, no focal pain. Associated weakness, poor PO intake, intermittent dizziness the last several days. Presented to ED in this setting. On arrival, SBPs 80s. Started on IVF. Initial labs with LA 4. Hgb 7.5 on POC labs  ?CT a/p with hepatic steatosis, hepatomegaly but otherwise no acute findings. ? ?Pt BP remained soft during volume resuscitation. PCCM consulted in this setting   ? ?Pertinent  Medical History  ? ?HTN ?Tobacco abuse  ? ?Significant Hospital Events: ?Including procedures, antibiotic start and stop dates in addition to other pertinent events   ?3/12 presented to ED with ongoing diarrhea. Requiring Volume resusc and PRBC for hypotension and anemia  ? ?Interim History / Subjective:  ? ?BP have improved with SBPs high 90s after IVF  ? ?Objective   ?Blood pressure (!) 99/57, pulse 70, temperature 99.4 ?F (37.4 ?C), temperature source Rectal, resp. rate 11, height 5\' 5"  (1.651 m), weight 86 kg, SpO2 100 %. ?   ?   ? ?Intake/Output Summary (Last 24 hours) at 06/29/2021 1747 ?Last data filed at 06/29/2021 1732 ?Gross per 24 hour  ?Intake 2441.01 ml  ?Output --  ?Net 2441.01 ml  ? ?Filed Weights  ? 06/29/21 1617  ?Weight: 86 kg  ? ? ?Examination: ?General: chronically and acutely ill appearing adult. Supine, NAD ?HENT: NCAT. Pink mmm, scalloped edges of tongue.  ?Lungs: Even unlabored.  ?Cardiovascular: rrr s1s2 cap refill < 3sec ?Abdomen: soft ndnt + bowel sounds  ?Extremities: no acute joint deformity. BLE mild edema ?Neuro: AAOx3 following commands. No focal deficit  ?GU:  defer  ? ?Resolved Hospital Problem list   ? ? ?Assessment & Plan:  ? ?Gastroenteritis  ?Hypovolemia ?Anemia (ABLA, chronic anemia, dilutional) ?-? GIB. No  bloody stool identified by pt  ?P ?-At this time, does not have ICU level requirement for care-- recommend admission to medicine service  ?-cont volume resuscitation ?-PRBc for hgb goal 7 ?-would send GI panel, Cdiff  ?-started on aztreonam, flagyl, vanc in ED  ?-will send a CEA  ? ?Hepatomegaly ?Hepatic steatosis  ?P ?-CMP is pending ? ?Hypokalemia ?-GI losses, poor PO intake ?P ?-replace, also replace mag ? ?Lactic acidosis ?-cont supportive care  ? ?Malnutrition ?P ?-rec RDN consult this admission  ? ? ?Best Practice (right click and "Reselect all SmartList Selections" daily)  ? ?Per primary  ? ?Labs   ?CBC: ?Recent Labs  ?Lab 06/29/21 ?1611 06/29/21 ?1706  ?WBC  --  7.2  ?HGB 7.5* 5.8*  ?HCT 22.0* 17.8*  ?MCV  --  124.5*  ?PLT  --  168  ? ? ?Basic Metabolic Panel: ?Recent Labs  ?Lab 06/29/21 ?1611  ?NA 136  ?K 2.6*  ?CL 94*  ?GLUCOSE 93  ?BUN <3*  ?CREATININE 0.40*  ? ?GFR: ?Estimated Creatinine Clearance (by C-G formula based on SCr of 0.4 mg/dL (L)) ?Female: 83 mL/min (A) ?Female: 101.5 mL/min (A) ?Recent Labs  ?Lab 06/29/21 ?1607 06/29/21 ?1706  ?WBC  --  7.2  ?LATICACIDVEN 4.0*  --   ? ? ?Liver Function Tests: ?No results  for input(s): AST, ALT, ALKPHOS, BILITOT, PROT, ALBUMIN in the last 168 hours. ?No results for input(s): LIPASE, AMYLASE in the last 168 hours. ?No results for input(s): AMMONIA in the last 168 hours. ? ?ABG ?   ?Component Value Date/Time  ? TCO2 29 06/29/2021 1611  ?  ? ?Coagulation Profile: ?Recent Labs  ?Lab 06/29/21 ?1558  ?INR 1.2  ? ? ?Cardiac Enzymes: ?No results for input(s): CKTOTAL, CKMB, CKMBINDEX, TROPONINI in the last 168 hours. ? ?HbA1C: ?Hgb A1c MFr Bld  ?Date/Time Value Ref Range Status  ?07/01/2015 09:33 AM 4.8 4.8 - 5.6 % Final  ?  Comment:  ?           Pre-diabetes: 5.7 - 6.4 ?         Diabetes: >6.4 ?         Glycemic  control for adults with diabetes: <7.0 ?  ? ? ?CBG: ?No results for input(s): GLUCAP in the last 168 hours. ? ?Review of Systems:   ?Review of Systems  ?Constitutional:  Positive for malaise/fatigue and weight loss.  ?HENT:  Positive for ear discharge.   ?Eyes: Negative.   ?Respiratory: Negative.    ?Cardiovascular: Negative.   ?Gastrointestinal:  Positive for abdominal pain, diarrhea and vomiting. Negative for blood in stool, constipation and melena.  ?Genitourinary: Negative.   ?Musculoskeletal: Negative.   ?Skin: Negative.   ?Neurological:  Positive for dizziness and weakness.  ?Endo/Heme/Allergies: Negative.   ?Psychiatric/Behavioral: Negative.    ? ? ?Past Medical History:  ?He,  has a past medical history of Acute bronchitis, Cervical high risk HPV (human papillomavirus) test positive (12/2017), Chronic back pain, Complication of anesthesia, Cough, Insomnia, unspecified, Other malaise and fatigue, Other specified diseases of blood and blood-forming organs(289.89), and Tobacco use disorder.  ? ?Surgical History:  ? ?Past Surgical History:  ?Procedure Laterality Date  ? ESOPHAGOGASTRODUODENOSCOPY N/A 03/17/2013  ? Procedure: ESOPHAGOGASTRODUODENOSCOPY (EGD);  Surgeon: Meryl Dare, MD;  Location: [facility] ENDOSCOPY;  Service: Endoscopy;  Laterality: N/A;  ? FRACTURE SURGERY    ? Laminectomy and foraminotomy at L5-S1    12/12/2019  ? TONSILLECTOMY    ?  ? ?Social History:  ? reports that he has been smoking cigarettes. He has a 6.00 pack-year smoking history. He has never used smokeless tobacco. He reports that he does not currently use alcohol. He reports that he does not use drugs.  ? ?Family History:  ?His family history includes Arthritis in his mother; Hypertension in his brother, sister, sister, sister, and sister.  ? ?Allergies ?Allergies  ?Allergen Reactions  ? Aspirin Hives  ? Lyrica [Pregabalin]   ?  Nightmares, did not help neuropathy  ? Penicillins Hives  ?  ? ?Home Medications  ?Prior to Admission  medications   ?Medication Sig Start Date End Date Taking? Authorizing Provider  ?albuterol (VENTOLIN HFA) 108 (90 Base) MCG/ACT inhaler INHALE 2 PUFFS INTO THE LUNGS EVERY 6 HOURS AS NEEDED FOR WHEEZING OR SHORTNESS OF BREATH 05/19/19   Sharon Seller, NP  ?calcium carbonate (CALCIUM 600) 600 MG TABS tablet Take 1 tablet (600 mg total) by mouth 2 (two) times daily with a meal. 01/06/21   Sharon Seller, NP  ?diphenhydrAMINE (BENADRYL) 25 MG tablet Take 25 mg by mouth as needed.    [provider]  ?folic acid (FOLVITE) 1 MG tablet Take 2 tablets (2 mg total) by mouth daily. 02/08/20   Sharon Seller, NP  ?losartan (COZAAR) 25 MG tablet TAKE 1 TABLET(25 MG) BY MOUTH  DAILY 07/24/19   Sharon Seller, NP  ?oxyCODONE-acetaminophen (PERCOCET/ROXICET) 5-325 MG tablet Take one tablet by mouth every 8-12 hours as needed for pain. 06/19/21   Sharon Seller, NP  ?potassium chloride (KLOR-CON) 20 MEQ tablet Take 1 tablet (20 mEq total) by mouth daily. 01/06/21   Sharon Seller, NP  ?trimethoprim-polymyxin b (POLYTRIM) ophthalmic solution Place 1 drop into both eyes QID. 01/03/21   Sharon Seller, NP  ?  ? ?Critical care time: n/a ?  ? ?Tessie Fass MSN, AGACNP-BC ?[facility] ?Amion for pager  ?06/29/2021, 6:10 PM ? ? ? ? ?

## 2021-06-29 NOTE — ED Notes (Signed)
2nd unit PRBC completed with no adverse effect , afebrile /respirations unlabored , VSWNL .  ?

## 2021-06-30 ENCOUNTER — Other Ambulatory Visit: Payer: Self-pay

## 2021-06-30 ENCOUNTER — Encounter (HOSPITAL_COMMUNITY): Payer: Self-pay | Admitting: Internal Medicine

## 2021-06-30 DIAGNOSIS — D649 Anemia, unspecified: Secondary | ICD-10-CM | POA: Insufficient documentation

## 2021-06-30 DIAGNOSIS — E876 Hypokalemia: Secondary | ICD-10-CM

## 2021-06-30 DIAGNOSIS — I959 Hypotension, unspecified: Secondary | ICD-10-CM | POA: Diagnosis present

## 2021-06-30 LAB — TROPONIN I (HIGH SENSITIVITY)
Troponin I (High Sensitivity): 8 ng/L (ref <18)
Troponin I (High Sensitivity): 8 ng/L (ref <18)

## 2021-06-30 LAB — GASTROINTESTINAL PANEL BY PCR, STOOL (REPLACES STOOL CULTURE)

## 2021-06-30 LAB — TYPE AND SCREEN
ABO/RH(D): O POS
Antibody Screen: NEGATIVE
Unit division: 0
Unit division: 0

## 2021-06-30 LAB — BASIC METABOLIC PANEL
Anion gap: 11 (ref 5–15)
Anion gap: 11 (ref 5–15)
Anion gap: 8 (ref 5–15)
BUN: <5 mg/dL — ABNORMAL LOW (ref 6–20)
BUN: <5 mg/dL — ABNORMAL LOW (ref 6–20)
BUN: <5 mg/dL — ABNORMAL LOW (ref 6–20)
CO2: 23 mmol/L (ref 22–32)
CO2: 28 mmol/L (ref 22–32)
CO2: 28 mmol/L (ref 22–32)
Calcium: 7.2 mg/dL — ABNORMAL LOW (ref 8.9–10.3)
Calcium: 7.2 mg/dL — ABNORMAL LOW (ref 8.9–10.3)
Calcium: 7.3 mg/dL — ABNORMAL LOW (ref 8.9–10.3)
Chloride: 100 mmol/L (ref 98–111)
Chloride: 102 mmol/L (ref 98–111)
Chloride: 98 mmol/L (ref 98–111)
Creatinine, Ser: 0.51 mg/dL (ref 0.44–1.00)
Creatinine, Ser: 0.57 mg/dL (ref 0.44–1.00)
Creatinine, Ser: 0.6 mg/dL (ref 0.44–1.00)
GFR, Estimated: >60 mL/min (ref >60)
GFR, Estimated: >60 mL/min (ref >60)
GFR, Estimated: >60 mL/min (ref >60)
Glucose, Bld: 100 mg/dL — ABNORMAL HIGH (ref 70–99)
Glucose, Bld: 104 mg/dL — ABNORMAL HIGH (ref 70–99)
Glucose, Bld: 87 mg/dL (ref 70–99)
Potassium: 2.7 mmol/L — CL (ref 3.5–5.1)
Potassium: 2.8 mmol/L — ABNORMAL LOW (ref 3.5–5.1)
Potassium: 4.6 mmol/L (ref 3.5–5.1)
Sodium: 136 mmol/L (ref 135–145)
Sodium: 136 mmol/L (ref 135–145)
Sodium: 137 mmol/L (ref 135–145)

## 2021-06-30 LAB — BPAM RBC
Blood Product Expiration Date: 202304092359
Blood Product Expiration Date: 202304092359
ISSUE DATE / TIME: 202303121807
ISSUE DATE / TIME: 202303122009
Unit Type and Rh: 5100
Unit Type and Rh: 5100

## 2021-06-30 LAB — CBC
HCT: 21.4 % — ABNORMAL LOW (ref 36.0–46.0)
HCT: 25.3 % — ABNORMAL LOW (ref 36.0–46.0)
Hemoglobin: 7 g/dL — ABNORMAL LOW (ref 12.0–15.0)
Hemoglobin: 8.6 g/dL — ABNORMAL LOW (ref 12.0–15.0)
MCH: 33.7 pg (ref 26.0–34.0)
MCH: 35 pg — ABNORMAL HIGH (ref 26.0–34.0)
MCHC: 32.7 g/dL (ref 30.0–36.0)
MCHC: 34 g/dL (ref 30.0–36.0)
MCV: 102.8 fL — ABNORMAL HIGH (ref 80.0–100.0)
MCV: 102.9 fL — ABNORMAL HIGH (ref 80.0–100.0)
Platelets: 131 10*3/uL — ABNORMAL LOW (ref 150–400)
Platelets: 147 10*3/uL — ABNORMAL LOW (ref 150–400)
RBC: 2.08 MIL/uL — ABNORMAL LOW (ref 3.87–5.11)
RBC: 2.46 MIL/uL — ABNORMAL LOW (ref 3.87–5.11)
RDW: 30.6 % — ABNORMAL HIGH (ref 11.5–15.5)
RDW: 31.5 % — ABNORMAL HIGH (ref 11.5–15.5)
WBC: 6 10*3/uL (ref 4.0–10.5)
WBC: 6.1 10*3/uL (ref 4.0–10.5)
nRBC: 0 % (ref 0.0–0.2)
nRBC: 0 % (ref 0.0–0.2)

## 2021-06-30 LAB — FERRITIN: Ferritin: 755 ng/mL — ABNORMAL HIGH (ref 11–307)

## 2021-06-30 LAB — PHOSPHORUS: Phosphorus: 2.4 mg/dL — ABNORMAL LOW (ref 2.5–4.6)

## 2021-06-30 LAB — IRON AND TIBC
Iron: 114 ug/dL (ref 28–170)
Saturation Ratios: 103 % — ABNORMAL HIGH (ref 10.4–31.8)
TIBC: 111 ug/dL — ABNORMAL LOW (ref 250–450)

## 2021-06-30 LAB — LACTIC ACID, PLASMA
Lactic Acid, Venous: 2 mmol/L (ref 0.5–1.9)
Lactic Acid, Venous: 2.9 mmol/L (ref 0.5–1.9)
Lactic Acid, Venous: 4.1 mmol/L (ref 0.5–1.9)

## 2021-06-30 LAB — TSH: TSH: 2.961 u[IU]/mL (ref 0.350–4.500)

## 2021-06-30 LAB — MAGNESIUM
Magnesium: 1.3 mg/dL — ABNORMAL LOW (ref 1.7–2.4)
Magnesium: 2.2 mg/dL (ref 1.7–2.4)

## 2021-06-30 LAB — C DIFFICILE QUICK SCREEN W PCR REFLEX
C Diff antigen: NEGATIVE
C Diff interpretation: NOT DETECTED
C Diff toxin: NEGATIVE

## 2021-06-30 LAB — LACTATE DEHYDROGENASE: LDH: 124 U/L (ref 98–192)

## 2021-06-30 LAB — RETICULOCYTES
Immature Retic Fract: 32.3 % — ABNORMAL HIGH (ref 2.3–15.9)
RBC.: 2.45 MIL/uL — ABNORMAL LOW (ref 3.87–5.11)
Retic Count, Absolute: 152.9 10*3/uL (ref 19.0–186.0)
Retic Ct Pct: 6.2 % — ABNORMAL HIGH (ref 0.4–3.1)

## 2021-06-30 LAB — HIV ANTIBODY (ROUTINE TESTING W REFLEX): HIV Screen 4th Generation wRfx: NONREACTIVE

## 2021-06-30 LAB — OCCULT BLOOD X 1 CARD TO LAB, STOOL: Fecal Occult Bld: NEGATIVE

## 2021-06-30 LAB — VITAMIN B12: Vitamin B-12: 713 pg/mL (ref 180–914)

## 2021-06-30 LAB — FOLATE: Folate: 2.5 ng/mL — ABNORMAL LOW (ref >5.9)

## 2021-06-30 MED ORDER — MAGNESIUM SULFATE 4 GM/100ML IV SOLN
4.0000 g | Freq: Once | INTRAVENOUS | Status: AC
  Administered 2021-06-30: 4 g via INTRAVENOUS
  Filled 2021-06-30: qty 100

## 2021-06-30 MED ORDER — POTASSIUM CHLORIDE CRYS ER 20 MEQ PO TBCR
40.0000 meq | EXTENDED_RELEASE_TABLET | ORAL | Status: AC
  Administered 2021-06-30 (×2): 40 meq via ORAL
  Filled 2021-06-30 (×2): qty 2

## 2021-06-30 MED ORDER — SODIUM CHLORIDE 0.9 % IV BOLUS
1000.0000 mL | Freq: Once | INTRAVENOUS | Status: AC
  Administered 2021-06-30: 1000 mL via INTRAVENOUS

## 2021-06-30 MED ORDER — PEG 3350-KCL-NA BICARB-NACL 420 G PO SOLR
4000.0000 mL | Freq: Once | ORAL | Status: AC
  Administered 2021-06-30: 4000 mL via ORAL
  Filled 2021-06-30: qty 4000

## 2021-06-30 MED ORDER — FOLIC ACID 1 MG PO TABS
1.0000 mg | ORAL_TABLET | Freq: Every day | ORAL | Status: DC
  Administered 2021-06-30 – 2021-07-02 (×2): 1 mg via ORAL
  Filled 2021-06-30 (×2): qty 1

## 2021-06-30 NOTE — Consult Note (Signed)
UNASSIGNED PATIENT Reason for Consult: Diarrhea Referring Physician: [facility] hospitalist.  Laura [name] is an 61 y.o. adult.  HPI: Laura [name] is a 61 year old female who identifies herself as a female and presents to the hospital with a history of diarrhea that has been problematic for the last 6 to 8 months.  She denies any history of melena but has noticed some bright red blood on the toilet tissue after several loose bowel movements which he attributed to rectal irritation. She denies having any food allergies. She has never had a colonoscopy.  She has been consuming a liquid diet with broth and replacement drinks and feels she is lost some weight But cannot quantify how much weight she is lost. She has had some nocturnal diarrhea as well. She was treated with Flagyl for a few days by her PCP last month with no change in her symptoms.  Patient feels weak and exhausted and has had difficulty ambulating for the last month.  Was found to be hypotensive with blood pressure 80 x 40 in the ER with a hemoglobin of 7.8 which dropped to 5.8 g/dL with hydration.  Multiple electrolyte abnormalities were noted as well aggressive IV hydration and electrolyte replacement was done.  She denies a family history of ulcerative colitis Crohn disease or colon cancer.  GI pathogen panel and C. difficile toxin assay are negative.  Noted to have a hemoglobin today of 7 g/dL with MCV of 102.9.  Lactic acid levels were up on admission. She had a cold EGD done by Dr. Lucio Edward in 2014 when she presented with melena was noted to have a Mallory-Weiss tear.  A CTA scan of the abdomen pelvis without contrast done yesterday revealed severe hip hepatic steatosis and hepatomegaly with no other acute findings.  Past Medical History:  Diagnosis Date   Acute bronchitis    Cervical high risk HPV (human papillomavirus) test positive 12/2017   Negative subtype 16, 18/45   Chronic back pain    Complication of anesthesia    "didn't wake up"    Cough    Insomnia, unspecified    Other malaise and fatigue    Other specified diseases of blood and blood-forming organs(289.89)    Tobacco use disorder    Past Surgical History:  Procedure Laterality Date   ESOPHAGOGASTRODUODENOSCOPY N/A 03/17/2013   Procedure: ESOPHAGOGASTRODUODENOSCOPY (EGD);  Surgeon: Ladene Artist, MD;  Location: [facility] ENDOSCOPY;  Service: Endoscopy;  Laterality: N/A;   FRACTURE SURGERY     Laminectomy and foraminotomy at L5-S1    12/12/2019   TONSILLECTOMY      Family History  Problem Relation Age of Onset   Arthritis Mother    Hypertension Sister    Hypertension Brother    Hypertension Sister    Hypertension Sister    Hypertension Sister    Social History:  reports that he has been smoking cigarettes. He has a 6.00 pack-year smoking history. He has never used smokeless tobacco. He reports that he does not currently use alcohol. He reports that he does not use drugs.  Allergies:  Allergies  Allergen Reactions   Aspirin Hives   Lyrica [Pregabalin]     Nightmares, did not help neuropathy   Penicillins Hives   Medications: I have reviewed the patient's current medications. Prior to Admission:  Medications Prior to Admission  Medication Sig Dispense Refill Last Dose   albuterol (VENTOLIN HFA) 108 (90 Base) MCG/ACT inhaler INHALE 2 PUFFS INTO THE LUNGS EVERY 6 HOURS AS NEEDED FOR WHEEZING  OR SHORTNESS OF BREATH (Patient taking differently: Inhale 1 puff into the lungs every 6 (six) hours as needed for shortness of breath.) 25.5 g 3 06/28/2021   calcium carbonate (CALCIUM 600) 600 MG TABS tablet Take 1 tablet (600 mg total) by mouth 2 (two) times daily with a meal. 60 tablet 3 06/28/2021   diphenhydrAMINE (BENADRYL) 25 MG tablet Take 25 mg by mouth every 6 (six) hours as needed for allergies.   Past Week   folic acid (FOLVITE) 1 MG tablet Take 2 tablets (2 mg total) by mouth daily. 60 tablet 5 06/28/2021   losartan (COZAAR) 25 MG tablet TAKE 1 TABLET(25 MG)  BY MOUTH DAILY (Patient taking differently: Take 25 mg by mouth daily.) 90 tablet 1 06/28/2021   oxyCODONE-acetaminophen (PERCOCET/ROXICET) 5-325 MG tablet Take one tablet by mouth every 8-12 hours as needed for pain. (Patient taking differently: Take 1 tablet by mouth every 6 (six) hours as needed for moderate pain.) 90 tablet 0 06/29/2021   potassium chloride (KLOR-CON) 20 MEQ tablet Take 1 tablet (20 mEq total) by mouth daily. 3 tablet 0 06/28/2021   Tetrahydrozoline HCl (VISINE OP) Place 1 drop into both eyes daily as needed (For dry eyes).   Past Week   trimethoprim-polymyxin b (POLYTRIM) ophthalmic solution Place 1 drop into both eyes QID. (Patient not taking: Reported on 06/29/2021) 10 mL 0 Not Taking   Scheduled:  enoxaparin (LOVENOX) injection  40 mg Subcutaneous [id number]   folic acid  1 mg Oral Daily   Continuous:  sodium chloride     aztreonam 2 g (06/30/21 1729)   vancomycin 1,750 mg (06/30/21 1902)   [id number] **OR** acetaminophen, ondansetron **OR** ondansetron (ZOFRAN) IV, oxyCODONE-acetaminophen  Results for orders placed or performed during the hospital encounter of 06/29/21 (from the past 48 hour(s))  Resp Panel by RT-PCR (Flu A&B, Covid) Nasopharyngeal Swab     Status: None   Collection Time: 06/29/21  3:29 PM   Specimen: Nasopharyngeal Swab; Nasopharyngeal(NP) swabs in vial transport medium  Result Value Ref Range   SARS Coronavirus 2 by RT PCR NEGATIVE NEGATIVE    Comment: (NOTE) SARS-CoV-2 target nucleic acids are NOT DETECTED.  The SARS-CoV-2 RNA is generally detectable in upper respiratory specimens during the acute phase of infection. The lowest concentration of SARS-CoV-2 viral copies this assay can detect is 138 copies/mL. A negative result does not preclude SARS-Cov-2 infection and should not be used as the sole basis for treatment or other patient management decisions. A negative result may occur with  improper specimen collection/handling, submission of  specimen other than nasopharyngeal swab, presence of viral mutation(s) within the areas targeted by this assay, and inadequate number of viral copies(<138 copies/mL). A negative result must be combined with clinical observations, patient history, and epidemiological information. The expected result is Negative.  Fact Sheet for Patients:  EntrepreneurPulse.com.au  Fact Sheet for Healthcare Providers:  IncredibleEmployment.be  This test is no t yet approved or cleared by the [place] FDA and  has been authorized for detection and/or diagnosis of SARS-CoV-2 by FDA under an Emergency Use Authorization (EUA). This EUA will remain  in effect (meaning this test can be used) for the duration of the COVID-19 declaration under Section 564(b)(1) of the Act, 21 U.S.C.section 360bbb-3(b)(1), unless the authorization is terminated  or revoked sooner.       Influenza A by PCR NEGATIVE NEGATIVE   Influenza B by PCR NEGATIVE NEGATIVE    Comment: (NOTE) The Xpert Xpress SARS-CoV-2/FLU/RSV plus assay is  intended as an aid in the diagnosis of influenza from Nasopharyngeal swab specimens and should not be used as a sole basis for treatment. Nasal washings and aspirates are unacceptable for Xpert Xpress SARS-CoV-2/FLU/RSV testing.  Fact Sheet for Patients: EntrepreneurPulse.com.au  Fact Sheet for Healthcare Providers: IncredibleEmployment.be  This test is not yet approved or cleared by the [place] FDA and has been authorized for detection and/or diagnosis of SARS-CoV-2 by FDA under an Emergency Use Authorization (EUA). This EUA will remain in effect (meaning this test can be used) for the duration of the COVID-19 declaration under Section 564(b)(1) of the Act, 21 U.S.C. section 360bbb-3(b)(1), unless the authorization is terminated or revoked.  Performed at [facility], [place] [address] [place],  [place] [address]   Protime-INR     Status: None   Collection Time: 06/29/21  3:58 PM  Result Value Ref Range   Prothrombin Time 15.1 11.4 - 15.2 seconds   INR 1.2 0.8 - 1.2    Comment: (NOTE) INR goal varies based on device and disease states. Performed at [facility], [place] [address] [place], [place] [address]   POC occult blood, ED     Status: None   Collection Time: 06/29/21  4:01 PM  Result Value Ref Range   Fecal Occult Bld NEGATIVE NEGATIVE  Lactic acid, plasma     Status: Abnormal   Collection Time: 06/29/21  4:07 PM  Result Value Ref Range   Lactic Acid, Venous 4.0 (HH) 0.5 - 1.9 mmol/L    Comment: CRITICAL RESULT CALLED TO, READ BACK BY AND VERIFIED WITH: E.SHAEFFER,RN 06/29/2021 AT 1709 A.HUGHES Performed at [facility], [place] [address], [place], [place] [address]   I-stat chem 8, ED (not at [facility] or [facility])     Status: Abnormal   Collection Time: 06/29/21  4:11 PM  Result Value Ref Range   Sodium 136 135 - 145 mmol/L   Potassium 2.6 (LL) 3.5 - 5.1 mmol/L   Chloride 94 (L) 98 - 111 mmol/L   BUN <3 (L) 6 - 20 mg/dL   Creatinine, Ser 0.40 (L) 0.44 - 1.00 mg/dL   Glucose, Bld 93 70 - 99 mg/dL    Comment: Glucose reference range applies only to samples taken after fasting for at least 8 hours.   Calcium, Ion 0.75 (LL) 1.15 - 1.40 mmol/L   TCO2 29 22 - 32 mmol/L   Hemoglobin 7.5 (L) 12.0 - 15.0 g/dL   HCT 22.0 (L) 36.0 - 46.0 %   Comment NOTIFIED PHYSICIAN   Type and screen [place]     Status: None   Collection Time: 06/29/21  4:57 PM  Result Value Ref Range   ABO/RH(D) O POS    Antibody Screen NEG    Sample Expiration 07/02/2021,2359    Unit Number [id number]    Blood Component Type RED CELLS,LR    Unit division 00    Status of Unit ISSUED,FINAL    Transfusion Status OK TO TRANSFUSE    Crossmatch Result      Compatible Performed at [facility], [address] [place],  [address]    Unit Number [id number]    Blood  Component Type RED CELLS,LR    Unit division 00    Status of Unit ISSUED,FINAL    Transfusion Status OK TO TRANSFUSE    Crossmatch Result Compatible   Prepare RBC (crossmatch)     Status: None   Collection Time: 06/29/21  5:00 PM  Result Value Ref Range   Order Confirmation      ORDER PROCESSED BY BLOOD BANK Performed at [facility], [place] [address] [place], [place] [address]   CBC     Status: Abnormal   Collection Time: 06/29/21  5:06 PM  Result Value Ref Range   WBC 7.2 4.0 - 10.5 K/uL   RBC 1.43 (L) 3.87 - 5.11 MIL/uL   Hemoglobin 5.8 (LL) 12.0 - 15.0 g/dL    Comment: REPEATED TO VERIFY THIS CRITICAL RESULT HAS VERIFIED AND BEEN CALLED TO RN,EMALY SHAFFER BY NAZERA SHAMA ON 03 12 2023 AT 1722, AND HAS BEEN READ BACK.     HCT 17.8 (L) 36.0 - 46.0 %   MCV 124.5 (H) 80.0 - 100.0 fL   MCH 40.6 (H) 26.0 - 34.0 pg   MCHC 32.6 30.0 - 36.0 g/dL   RDW Not Measured 11.5 - 15.5 %   Platelets 168 150 - 400 K/uL   nRBC 0.3 (H) 0.0 - 0.2 %    Comment: Performed at [facility] [address], [place], [place] [address]  Lactic acid, plasma     Status: Abnormal   Collection Time: 06/29/21  5:20 PM  Result Value Ref Range   Lactic Acid, Venous 4.3 (HH) 0.5 - 1.9 mmol/L    Comment: CRITICAL VALUE NOTED.  VALUE IS CONSISTENT WITH PREVIOUSLY REPORTED AND CALLED VALUE. Performed at [facility], [place] [address] [place], [place] [address]   Comprehensive metabolic panel     Status: Abnormal   Collection Time: 06/29/21  5:20 PM  Result Value Ref Range   Sodium 135 135 - 145 mmol/L   Potassium 2.8 (L) 3.5 - 5.1 mmol/L   Chloride 97 (L) 98 - 111 mmol/L   CO2 25 22 - 32 mmol/L   Glucose, Bld 89 70 - 99 mg/dL    Comment: Glucose reference range applies only to samples taken after fasting for at least 8 hours.   BUN <5 (L) 6 - 20 mg/dL   Creatinine, Ser 0.56 0.44 - 1.00 mg/dL   Calcium 6.6 (L) 8.9 - 10.3 mg/dL   Total Protein 4.6 (L) 6.5 - 8.1 g/dL   Albumin 2.2 (L) 3.5 -  5.0 g/dL   AST 34 15 - 41 U/L   ALT 17 0 - 44 U/L   Alkaline Phosphatase 176 (H) 38 - 126 U/L   Total Bilirubin 2.7 (H) 0.3 - 1.2 mg/dL   GFR, Estimated >60 >60 mL/min    Comment: (NOTE) Calculated using the CKD-EPI Creatinine Equation (2021)    Anion gap 13 5 - 15    Comment: Performed at [facility], [place] [address], [place], [place] [address]  Troponin I (High Sensitivity)     Status: None   Collection Time: 06/29/21  5:20 PM  Result Value Ref Range   Troponin I (High Sensitivity) 7 <18 ng/L    Comment: (NOTE) Elevated high sensitivity troponin I (hsTnI) values and significant  changes across serial measurements may suggest ACS but many other  chronic and acute conditions are known to elevate hsTnI results.  Refer to the Links section for chest pain algorithms and additional  guidance. Performed at [facility], [place] [address], [place], [place] [address]   Culture, blood (single)     Status: None (Preliminary result)   Collection Time: 06/29/21  5:20 PM   Specimen: BLOOD RIGHT WRIST  Result Value Ref Range  Specimen Description BLOOD RIGHT WRIST    Special Requests      BOTTLES DRAWN AEROBIC ONLY Blood Culture results may not be optimal due to an inadequate volume of blood received in culture bottles   Culture      NO GROWTH < 24 HOURS Performed at [facility] [address], [place], [place] [address]    Report Status PENDING   HIV Antibody (routine testing w rflx)     Status: None   Collection Time: 06/30/21 12:05 AM  Result Value Ref Range   HIV Screen 4th Generation wRfx Non Reactive Non Reactive    Comment: Performed at [facility], [place] [address] [place], [place] [id number]  Basic metabolic panel     Status: Abnormal   Collection Time: 06/30/21 12:05 AM  Result Value Ref Range   Sodium 137 135 - 145 mmol/L   Potassium 2.8 (L) 3.5 - 5.1 mmol/L   Chloride 98 98 - 111 mmol/L   CO2 28 22 - 32 mmol/L   Glucose, Bld 100 (H) 70 - 99 mg/dL     Comment: Glucose reference range applies only to samples taken after fasting for at least 8 hours.   BUN <5 (L) 6 - 20 mg/dL   Creatinine, Ser 0.60 0.44 - 1.00 mg/dL   Calcium 7.2 (L) 8.9 - 10.3 mg/dL   GFR, Estimated >60 >60 mL/min    Comment: (NOTE) Calculated using the CKD-EPI Creatinine Equation (2021)    Anion gap 11 5 - 15    Comment: Performed at [facility] [address] [place], [place] [address]  Magnesium     Status: Abnormal   Collection Time: 06/30/21 12:05 AM  Result Value Ref Range   Magnesium 1.3 (L) 1.7 - 2.4 mg/dL    Comment: Performed at [facility] [address] [place], [place] [address]  Phosphorus     Status: Abnormal   Collection Time: 06/30/21 12:05 AM  Result Value Ref Range   Phosphorus 2.4 (L) 2.5 - 4.6 mg/dL    Comment: Performed at [facility] [address], [place],  [address]  CBC     Status: Abnormal   Collection Time: 06/30/21 12:05 AM  Result Value Ref Range   WBC 6.0 4.0 - 10.5 K/uL   RBC 2.46 (L) 3.87 - 5.11 MIL/uL   Hemoglobin 8.6 (L) 12.0 - 15.0 g/dL    Comment: REPEATED TO VERIFY POST TRANSFUSION SPECIMEN    HCT 25.3 (L) 36.0 - 46.0 %   MCV 102.8 (H) 80.0 - 100.0 fL    Comment: REPEATED TO VERIFY DELTA CHECK NOTED    MCH 35.0 (H) 26.0 - 34.0 pg   MCHC 34.0 30.0 - 36.0 g/dL   RDW 30.6 (H) 11.5 - 15.5 %   Platelets 131 (L) 150 - 400 K/uL    Comment: REPEATED TO VERIFY PLATELET COUNT CONFIRMED BY SMEAR    nRBC 0.0 0.0 - 0.2 %    Comment: Performed at [facility], [place] [address] [place], [place] [address]  Reticulocytes     Status: Abnormal   Collection Time: 06/30/21 12:05 AM  Result Value Ref Range   Retic Ct Pct 6.2 (H) 0.4 - 3.1 %   RBC. 2.45 (L) 3.87 - 5.11 MIL/uL   Retic Count, Absolute 152.9 19.0 - 186.0 K/uL   Immature Retic Fract 32.3 (H) 2.3 - 15.9 %    Comment: Performed at [facility], [place] [address], [place], [place] [address]  Lactic acid, plasma     Status:  Abnormal   Collection Time: 06/30/21 12:05 AM  Result Value Ref Range   Lactic Acid, Venous 2.9 (HH) 0.5 - 1.9 mmol/L    Comment: CRITICAL VALUE NOTED.  VALUE IS CONSISTENT WITH PREVIOUSLY REPORTED AND CALLED VALUE. Performed at [facility], [place] [address], [place], [place] [address]   Troponin I (High Sensitivity)     Status: None   Collection Time: 06/30/21 12:05 AM  Result Value Ref Range   Troponin I (High Sensitivity) 8 <18 ng/L    Comment: (NOTE) Elevated high sensitivity troponin I (hsTnI) values and significant  changes across serial measurements may suggest ACS but many other  chronic and acute conditions are known to elevate hsTnI results.  Refer to the "Links" section for chest pain algorithms and additional  guidance. Performed at [facility], [place] [address], [place], [place] [address]   Lactate dehydrogenase     Status: None   Collection Time: 06/30/21 12:05 AM  Result Value Ref Range   LDH 124 98 - 192 U/L    Comment: Performed at [facility], [place] [address] [place], [place] [id number]  Basic metabolic panel     Status: Abnormal   Collection Time: 06/30/21  4:35 AM  Result Value Ref Range   Sodium 136 135 - 145 mmol/L   Potassium 2.7 (LL) 3.5 - 5.1 mmol/L    Comment: CRITICAL RESULT CALLED TO, READ BACK BY AND VERIFIED WITH: SANGALANG B,RN 06/30/21 0509 WAYK    Chloride 100 98 - 111 mmol/L   CO2 28 22 - 32 mmol/L   Glucose, Bld 104 (H) 70 - 99 mg/dL    Comment: Glucose reference range applies only to samples taken after fasting for at least 8 hours.   BUN <5 (L) 6 - 20 mg/dL   Creatinine, Ser 0.51 0.44 - 1.00 mg/dL   Calcium 7.2 (L) 8.9 - 10.3 mg/dL   GFR, Estimated >60 >60 mL/min    Comment: (NOTE) Calculated using the CKD-EPI Creatinine Equation (2021)    Anion gap 8 5 - 15    Comment: Performed at [facility] [address] [place], [place] [address]  CBC     Status: Abnormal   Collection Time: 06/30/21  4:35 AM  Result  Value Ref Range   WBC 6.1 4.0 - 10.5 K/uL   RBC 2.08 (L) 3.87 - 5.11 MIL/uL   Hemoglobin 7.0 (L) 12.0 - 15.0 g/dL   HCT 21.4 (L) 36.0 - 46.0 %   MCV 102.9 (H) 80.0 - 100.0 fL   MCH 33.7 26.0 - 34.0 pg   MCHC 32.7 30.0 - 36.0 g/dL   RDW 31.5 (H) 11.5 - 15.5 %   Platelets 147 (L) 150 - 400 K/uL   nRBC 0.0 0.0 - 0.2 %    Comment: Performed at [facility], [place] [address], [place], [place] [address]  TSH     Status: None   Collection Time: 06/30/21  4:35 AM  Result Value Ref Range   TSH 2.961 0.350 - 4.500 uIU/mL    Comment: Performed by a 3rd Generation assay with a functional sensitivity of <=0.01 uIU/mL. Performed at [facility], [place] [address], [place], [place] [address]   Iron and TIBC     Status: Abnormal   Collection Time: 06/30/21  4:35 AM  Result Value Ref  Range   Iron 114 28 - 170 ug/dL   TIBC 111 (L) 250 - 450 ug/dL   Saturation Ratios 103 (H) 10.4 - 31.8 %   UIBC NOT CALCULATED ug/dL    Comment: Performed at [facility] [address], [place], [place] [address]  Ferritin     Status: Abnormal   Collection Time: 06/30/21  4:35 AM  Result Value Ref Range   Ferritin 755 (H) 11 - 307 ng/mL    Comment: Performed at [facility], [place] [address], [place], [place] [address]  Vitamin B12     Status: None   Collection Time: 06/30/21  4:35 AM  Result Value Ref Range   Vitamin B-12 713 180 - 914 pg/mL    Comment: (NOTE) This assay is not validated for testing neonatal or myeloproliferative syndrome specimens for Vitamin B12 levels. Performed at [facility], [place] [address] [place], [place] [address]   Folate, serum, performed at [facility] lab     Status: Abnormal   Collection Time: 06/30/21  4:35 AM  Result Value Ref Range   Folate 2.5 (L) >5.9 ng/mL    Comment: Performed at [facility], [place] [address], [place], [place] [address]  Lactic acid, plasma     Status: Abnormal   Collection Time: 06/30/21  7:52 AM  Result Value Ref  Range   Lactic Acid, Venous 2.0 (HH) 0.5 - 1.9 mmol/L    Comment: CRITICAL VALUE NOTED.  VALUE IS CONSISTENT WITH PREVIOUSLY REPORTED AND CALLED VALUE. Performed at [facility], [place] [address], [place], [place] [address]   Magnesium     Status: None   Collection Time: 06/30/21  7:52 AM  Result Value Ref Range   Magnesium 2.2 1.7 - 2.4 mg/dL    Comment: Performed at [facility] [address], [place], [place] [address]  Troponin I (High Sensitivity)     Status: None   Collection Time: 06/30/21  7:52 AM  Result Value Ref Range   Troponin I (High Sensitivity) 8 <18 ng/L    Comment: (NOTE) Elevated high sensitivity troponin I (hsTnI) values and significant  changes across serial measurements may suggest ACS but many other  chronic and acute conditions are known to elevate hsTnI results.  Refer to the "Links" section for chest pain algorithms and additional  guidance. Performed at [facility], [place] [address] [place], [place], [place] [address]   Gastrointestinal Panel by PCR , Stool     Status: None   Collection Time: 06/30/21  8:44 AM   Specimen: Stool  Result Value Ref Range   Campylobacter species NOT DETECTED NOT DETECTED   Plesimonas shigelloides NOT DETECTED NOT DETECTED   Salmonella species NOT DETECTED NOT DETECTED   Yersinia enterocolitica NOT DETECTED NOT DETECTED   Vibrio species NOT DETECTED NOT DETECTED   Vibrio cholerae NOT DETECTED NOT DETECTED   Enteroaggregative E coli (EAEC) NOT DETECTED NOT DETECTED   Enteropathogenic E coli (EPEC) NOT DETECTED NOT DETECTED   Enterotoxigenic E coli (ETEC) NOT DETECTED NOT DETECTED   Shiga like toxin producing E coli (STEC) NOT DETECTED NOT DETECTED   Shigella/Enteroinvasive E coli (EIEC) NOT DETECTED NOT DETECTED   Cryptosporidium NOT DETECTED NOT DETECTED   Cyclospora cayetanensis NOT DETECTED NOT DETECTED   Entamoeba histolytica NOT DETECTED NOT DETECTED   Giardia lamblia NOT DETECTED NOT DETECTED   Adenovirus  F40/41 NOT DETECTED NOT DETECTED   Astrovirus NOT DETECTED  NOT DETECTED   Norovirus GI/GII NOT DETECTED NOT DETECTED   Rotavirus A NOT DETECTED NOT DETECTED   Sapovirus (I, II, IV, and V) NOT DETECTED NOT DETECTED    Comment: Performed at [facility], [address] [place], [place] [address]  C Difficile Quick Screen w PCR reflex     Status: None   Collection Time: 06/30/21  8:44 AM   Specimen: Stool  Result Value Ref Range   C Diff antigen NEGATIVE NEGATIVE   C Diff toxin NEGATIVE NEGATIVE   C Diff interpretation No C. difficile detected.     Comment: Performed at [facility], [place] [address] [place], [place] [address]  Occult blood card to lab, stool     Status: None   Collection Time: 06/30/21  8:48 AM  Result Value Ref Range   Fecal Occult Bld NEGATIVE NEGATIVE    Comment: Performed at [facility] [address], [place], [place] [address]  Lactic acid, plasma     Status: Abnormal   Collection Time: 06/30/21 11:45 AM  Result Value Ref Range   Lactic Acid, Venous 4.1 (HH) 0.5 - 1.9 mmol/L    Comment: CRITICAL VALUE NOTED.  VALUE IS CONSISTENT WITH PREVIOUSLY REPORTED AND CALLED VALUE. Performed at [facility], [place] [address] [place], [place] [id number]   Basic metabolic panel     Status: Abnormal   Collection Time: 06/30/21 11:45 AM  Result Value Ref Range   Sodium 136 135 - 145 mmol/L   Potassium 4.6 3.5 - 5.1 mmol/L    Comment: DELTA CHECK NOTED   Chloride 102 98 - 111 mmol/L   CO2 23 22 - 32 mmol/L   Glucose, Bld 87 70 - 99 mg/dL    Comment: Glucose reference range applies only to samples taken after fasting for at least 8 hours.   BUN <5 (L) 6 - 20 mg/dL   Creatinine, Ser 0.57 0.44 - 1.00 mg/dL   Calcium 7.3 (L) 8.9 - 10.3 mg/dL   GFR, Estimated >60 >60 mL/min    Comment: (NOTE) Calculated using the CKD-EPI Creatinine Equation (2021)    Anion gap 11 5 - 15    Comment: Performed at [facility] [address]  [place], [place] [address]    CT ABDOMEN PELVIS WO CONTRAST  Result Date: 06/29/2021 CLINICAL DATA:  Abdominal pain for 1 month. Dizziness and weakness for several days. EXAM: CT ABDOMEN AND PELVIS WITHOUT CONTRAST TECHNIQUE: Multidetector CT imaging of the abdomen and pelvis was performed following the standard protocol without IV contrast. RADIATION DOSE REDUCTION: This exam was performed according to the departmental dose-optimization program which includes automated exposure control, adjustment of the mA and/or kV according to patient size and/or use of iterative reconstruction technique. COMPARISON:  None. FINDINGS: Lower chest: No acute findings. Hepatobiliary: Severe diffuse hepatic steatosis and hepatomegaly noted. No mass visualized on this unenhanced exam. Gallbladder is unremarkable. No evidence of biliary ductal dilatation. Pancreas: No mass or inflammatory process visualized on this unenhanced exam. Spleen:  Within normal limits in size. Adrenals/Urinary tract: No evidence of urolithiasis or hydronephrosis. Unremarkable unopacified urinary bladder. Stomach/Bowel: No evidence of obstruction, inflammatory process, or abnormal fluid collections. Normal appendix visualized. Vascular/Lymphatic: No pathologically enlarged lymph nodes identified. No evidence of abdominal aortic aneurysm. Aortic atherosclerotic calcification noted. Reproductive:  No mass or other significant abnormality. Other:  None. Musculoskeletal:  No suspicious bone lesions identified. : Severe hepatic steatosis and hepatomegaly.  No other  acute findings. Electronically Signed   By: Marlaine Hind M.D.   On: 06/29/2021 16:31   DG Chest Port 1 View  Result Date: 06/29/2021 CLINICAL DATA:  Weakness EXAM: PORTABLE CHEST 1 VIEW COMPARISON:  08/13/2017 FINDINGS: The heart size and mediastinal contours are within normal limits. Both lungs are clear. The visualized skeletal structures are unremarkable. IMPRESSION: No acute abnormality of the  lungs in AP portable projection. Electronically Signed   By: Delanna Ahmadi M.D.   On: 06/29/2021 16:18    Review of Systems Blood pressure 96/71, pulse 89, temperature 98 F (36.7 C), temperature source Oral, resp. rate 18, height 5\' 5"  (1.651 m), weight 86 kg, SpO2 99 %. Physical Exam  Assessment/Plan: 1) Chronic diarrhea with lactic acidosis/dehydration and electrolyte abnormalities-patient has received 2 units of packed red blood cells hemoglobin is still down to 7 g/dL.She is FOBT negative x 2 patient will benefit from an EGD with small bowel biopsies and a colonoscopy-will prep  tonite. 2) Chronic folate deficiency deficiency on replacement therapy. 3) Severe hepatic steatosis with hepatomegaly on CT scan done yesterday. 4) Hypertension-on losartan at home which is currently on hold. 5) Chronic low back pain-multiple changes noted on MRI done on 04/01/2020. Juanita Craver 06/30/2021, 5:33 PM

## 2021-06-30 NOTE — Progress Notes (Signed)
?  Transition of Care (TOC) Screening Note ? ? ?Patient Details  ?Name: Laura [name] ?Date of Birth: Jan 22, 1961 ? ? ?Transition of Care (TOC) CM/SW Contact:    ?Mearl Latin, LCSW ?Phone Number: ?06/30/2021, 2:05 PM ? ? ? ?Transition of Care Department [facility]) has reviewed patient and no TOC needs have been identified at this time. We will continue to monitor patient advancement through interdisciplinary progression rounds. If new patient transition needs arise, please place a TOC consult. ? ? ?

## 2021-06-30 NOTE — Progress Notes (Signed)
PROGRESS NOTE    Laura [name]  SHF:[id number] DOB: 16-Jul-1960 DOA: 06/29/2021 PCP: Sharon Seller, NP   Brief Narrative:  Laura [name] is a 61 y.o. adult female who identifies as female with medical history significant of HTN who was in his usual state of reasonable health until mid November 2022 when he developed diarrhea.  Patient is unable to provide a specific history as he does not keep track of dates.  He states he initially developed nausea and vomiting "I had to keep a bucket beside my bed" in mid November but then subsequently progressed to having watery diarrhea.  He states that the vomiting subsided once the diarrhea started.  Notes that the diarrhea waxes and wanes but has been present over the past 3 to 4 months.  Patient is not sure if the diarrhea is worse when he eats however he has cut down on his p.o. intake and is only drinking chicken broth and meal replacement drinks.  He thinks he has lost weight but is not sure how much "my close fall off me".  Patient notes he gets up multiple times at night with diarrhea.  On bad days he has diarrhea 5-7 times a day, on better days he has diarrhea 2-3 times a day.  Patient was seen by his PCP last month and was treated with Flagyl with no effect.   Patient has been feeling weak and tired with difficulty ambulating for the past 2 to 3 weeks.  2 days ago he had an episode of syncope, did not hit his head, does not know how long he was out but there was family around and he was not confused when he came to.  Today he noted he was unable to walk back and forth to the bathroom without being severely exhausted.   Patient has not been started on any new medications, denies any travel out of the country and states that he only drinks bottled water.  Patient denies alcohol use.   Patient denies any blood in the stool.  Notes that it is watery, yellow or brown.  Did have 1 episode of bright red blood per rectum on the toilet paper but never in the toilet.   He attributed this to raw anal skin.   Patient's initial blood pressures were 80s over 40s with initial hemoglobin of 7.8 which decreased to 5.8 with hydration.  Stool guaiac was negative although there was not much stool in the rectal vault he had multiple electrolyte abnormalities including hypokalemia, hypomagnesemia and hypocalcemia.  He was treated with IV fluid resuscitation, aggressive electrolyte repletion and initiation of transfusion.  Assessment & Plan:   Principal Problem:   Lactic acidosis Active Problems:   Obesity (BMI 30.0-34.9)   Hypotension   Hypokalemia   Normocytic anemia   Hypomagnesemia  Hypotension/lactic acidosis/dehydration: Likely secondary to chronic diarrhea with decreased p.o. intake.  Patient has been started on antibiotics, I do not see any signs of infection.  We will discontinue antibiotics and watch closely.  Lactic acid improving but still elevated.  Still with low blood pressure.  I have ordered 1 L of fluid bolus and once that is completed, will resume back at maintenance fluid.  Monitor lactic acid.   Subacute diarrhea for 3 to 4 months: Discussed and consulted GI on-call/Dr. Loreta Ave and per her recommendation, I have ordered celiac panel.  C. difficile negative.  GI pathogen panel pending.  Will defer further management to her.   Macrocytic anemia/folate deficiency: Presented with hemoglobin  of 5.8.  Received 2 units of PRBC.  Hemoglobin improved to 8.6 and then dropped to 7.0 again.  We will recheck hemoglobin later and transfuse if less than 7.  FOBT x2 is negative so far.  It appears that patient has history of chronic folate deficiency.  Folic acid still 2.5, better than what it was 5 months ago 1.9.  Will start replacement.  Chronic thrombocytopenia: Stable.  Hypokalemia: Resolved.  Hypomagnesemia: Resolved.   Hypocalcemia: Improving.   HTN: of note patient has continued to take losartan throughout her course of illness.  Currently on hold.  Blood  pressure controlled.   Chronic lower back pain: Continue as needed Percocet per home doses.  DVT prophylaxis: enoxaparin (LOVENOX) injection 40 mg Start: 06/30/21 1200   Code Status: Full Code  Family Communication:  None present at bedside.  Plan of care discussed with patient in length and he/she verbalized understanding and agreed with it.  Status is: Inpatient Remains inpatient appropriate because: Needs further work-up of chronic diarrhea and anemia.   Estimated body mass index is 31.55 kg/m as calculated from the following:   Height as of this encounter: 5\' 5"  (1.651 m).   Weight as of this encounter: 86 kg.    Nutritional Assessment: Body mass index is 31.55 kg/m. Seen by dietician.  I agree with the assessment and plan as outlined below: Nutrition Status:        . Skin Assessment: I have examined the patient's skin and I agree with the wound assessment as performed by the wound care RN as outlined below:    Consultants:  GI  Procedures:  None  Antimicrobials:  Anti-infectives (From admission, onward)    Start     Dose/Rate Route Frequency Ordered Stop   06/30/21 1800  vancomycin (VANCOREADY) IVPB 1750 mg/350 mL        1,750 mg 175 mL/hr over 120 Minutes Intravenous Every 24 hours 06/29/21 1834     06/30/21 0200  aztreonam (AZACTAM) 2 g in sodium chloride 0.9 % 100 mL IVPB        2 g 200 mL/hr over 30 Minutes Intravenous Every 8 hours 06/29/21 1834     06/29/21 1730  vancomycin (VANCOREADY) IVPB 1750 mg/350 mL        1,750 mg 175 mL/hr over 120 Minutes Intravenous  Once 06/29/21 1719 06/29/21 2021   06/29/21 1715  aztreonam (AZACTAM) 2 g in sodium chloride 0.9 % 100 mL IVPB        2 g 200 mL/hr over 30 Minutes Intravenous  Once 06/29/21 1714 06/29/21 1823   06/29/21 1715  metroNIDAZOLE (FLAGYL) IVPB 500 mg        500 mg 100 mL/hr over 60 Minutes Intravenous  Once 06/29/21 1714 06/29/21 1919   06/29/21 1715  vancomycin (VANCOCIN) IVPB 1000 mg/200 mL  premix  Status:  Discontinued        1,000 mg 200 mL/hr over 60 Minutes Intravenous  Once 06/29/21 1714 06/29/21 1719         Subjective: Seen and examined this morning.  He complains of generalized weakness but otherwise has no other complaint.  Does not seem to be forthcoming with the history.  Objective: Vitals:   06/30/21 1130 06/30/21 1145 06/30/21 1215 06/30/21 1230  BP: 102/63 104/72 111/70 102/82  Pulse: 77 70 74 72  Resp:      Temp:      TempSrc:      SpO2: 100% 100% 99% 100%  Weight:  Height:        Intake/Output Summary (Last 24 hours) at 06/30/2021 1407 Last data filed at 06/30/2021 1214 Gross per 24 hour  Intake 6212.99 ml  Output --  Net 6212.99 ml   Filed Weights   06/29/21 1617  Weight: 86 kg    Examination:  General exam: Appears calm and comfortable  Respiratory system: Clear to auscultation. Respiratory effort normal. Cardiovascular system: S1 & S2 heard, RRR. No JVD, murmurs, rubs, gallops or clicks. No pedal edema. Gastrointestinal system: Abdomen is nondistended, soft and nontender. No organomegaly or masses felt. Normal bowel sounds heard. Central nervous system: Alert and oriented. No focal neurological deficits. Extremities: Symmetric 5 x 5 power. Skin: No rashes, lesions or ulcers    Data Reviewed: I have personally reviewed following labs and imaging studies  CBC: Recent Labs  Lab 06/29/21 1611 06/29/21 1706 06/30/21 0005 06/30/21 0435  WBC  --  7.2 6.0 6.1  HGB 7.5* 5.8* 8.6* 7.0*  HCT 22.0* 17.8* 25.3* 21.4*  MCV  --  124.5* 102.8* 102.9*  PLT  --  168 131* 147*   Basic Metabolic Panel: Recent Labs  Lab 06/29/21 1611 06/29/21 1720 06/30/21 0005 06/30/21 0435 06/30/21 0752 06/30/21 1145  NA 136 135 137 136  --  136  K 2.6* 2.8* 2.8* 2.7*  --  4.6  CL 94* 97* 98 100  --  102  CO2  --  --  23  GLUCOSE 93 89 100* 104*  --  87  BUN <3* <5* <5* <5*  --  <5*  CREATININE 0.40* 0.56 0.60 0.51  --  0.57   CALCIUM  --  6.6* 7.2* 7.2*  --  7.3*  MG  --   --  1.3*  --  2.2  --   PHOS  --   --  2.4*  --   --   --    GFR: Estimated Creatinine Clearance (by C-G formula based on SCr of 0.57 mg/dL) Female: 48 mL/min Female: 101.5 mL/min Liver Function Tests: Recent Labs  Lab 06/29/21 1720  AST 34  ALT 17  ALKPHOS 176*  BILITOT 2.7*  PROT 4.6*  ALBUMIN 2.2*   No results for input(s): LIPASE, AMYLASE in the last 168 hours. No results for input(s): AMMONIA in the last 168 hours. Coagulation Profile: Recent Labs  Lab 06/29/21 1558  INR 1.2   Cardiac Enzymes: No results for input(s): CKTOTAL, CKMB, CKMBINDEX, TROPONINI in the last 168 hours. BNP (last 3 results) No results for input(s): PROBNP in the last 8760 hours. HbA1C: No results for input(s): HGBA1C in the last 72 hours. CBG: No results for input(s): GLUCAP in the last 168 hours. Lipid Profile: No results for input(s): CHOL, HDL, LDLCALC, TRIG, CHOLHDL, LDLDIRECT in the last 72 hours. Thyroid Function Tests: Recent Labs    06/30/21 0435  TSH 2.961   Anemia Panel: Recent Labs    06/30/21 0005 06/30/21 0435  VITAMINB12  --  713  FOLATE  --  2.5*  FERRITIN  --  755*  TIBC  --  111*  IRON  --  114  RETICCTPCT 6.2*  --    Sepsis Labs: Recent Labs  Lab 06/29/21 1720 06/30/21 0005 06/30/21 0752 06/30/21 1145  LATICACIDVEN 4.3* 2.9* 2.0* 4.1*    Recent Results (from the past 240 hour(s))  Resp Panel by RT-PCR (Flu A&B, Covid) Nasopharyngeal Swab     Status: None   Collection Time: 06/29/21  3:29 PM  Specimen: Nasopharyngeal Swab; Nasopharyngeal(NP) swabs in vial transport medium  Result Value Ref Range Status   SARS Coronavirus 2 by RT PCR NEGATIVE NEGATIVE Final    Comment: (NOTE) SARS-CoV-2 target nucleic acids are NOT DETECTED.  The SARS-CoV-2 RNA is generally detectable in upper respiratory specimens during the acute phase of infection. The lowest concentration of SARS-CoV-2 viral copies this assay can  detect is 138 copies/mL. A negative result does not preclude SARS-Cov-2 infection and should not be used as the sole basis for treatment or other patient management decisions. A negative result may occur with  improper specimen collection/handling, submission of specimen other than nasopharyngeal swab, presence of viral mutation(s) within the areas targeted by this assay, and inadequate number of viral copies(<138 copies/mL). A negative result must be combined with clinical observations, patient history, and epidemiological information. The expected result is Negative.  Fact Sheet for Patients:  BloggerCourse.com  Fact Sheet for Healthcare Providers:  SeriousBroker.it  This test is no t yet approved or cleared by the [place] FDA and  has been authorized for detection and/or diagnosis of SARS-CoV-2 by FDA under an Emergency Use Authorization (EUA). This EUA will remain  in effect (meaning this test can be used) for the duration of the COVID-19 declaration under Section 564(b)(1) of the Act, 21 U.S.C.section 360bbb-3(b)(1), unless the authorization is terminated  or revoked sooner.       Influenza A by PCR NEGATIVE NEGATIVE Final   Influenza B by PCR NEGATIVE NEGATIVE Final    Comment: (NOTE) The Xpert Xpress SARS-CoV-2/FLU/RSV plus assay is intended as an aid in the diagnosis of influenza from Nasopharyngeal swab specimens and should not be used as a sole basis for treatment. Nasal washings and aspirates are unacceptable for Xpert Xpress SARS-CoV-2/FLU/RSV testing.  Fact Sheet for Patients: BloggerCourse.com  Fact Sheet for Healthcare Providers: SeriousBroker.it  This test is not yet approved or cleared by the [place] FDA and has been authorized for detection and/or diagnosis of SARS-CoV-2 by FDA under an Emergency Use Authorization (EUA). This EUA will remain in  effect (meaning this test can be used) for the duration of the COVID-19 declaration under Section 564(b)(1) of the Act, 21 U.S.C. section 360bbb-3(b)(1), unless the authorization is terminated or revoked.  Performed at [facility], [address], [place], [place] [address]   Culture, blood (single)     Status: None (Preliminary result)   Collection Time: 06/29/21  5:20 PM   Specimen: BLOOD RIGHT WRIST  Result Value Ref Range Status   Specimen Description BLOOD RIGHT WRIST  Final   Special Requests   Final    BOTTLES DRAWN AEROBIC ONLY Blood Culture results may not be optimal due to an inadequate volume of blood received in culture bottles   Culture   Final    NO GROWTH < 24 HOURS Performed at [facility], [address], [place], [place] [address]    Report Status PENDING  Incomplete  C Difficile Quick Screen w PCR reflex     Status: None   Collection Time: 06/30/21  8:44 AM   Specimen: Stool  Result Value Ref Range Status   C Diff antigen NEGATIVE NEGATIVE Final   C Diff toxin NEGATIVE NEGATIVE Final   C Diff interpretation No C. difficile detected.  Final    Comment: Performed at [facility], [address] [place], [place] [address]     Radiology Studies: CT ABDOMEN PELVIS WO CONTRAST  Result Date: 06/29/2021  CLINICAL DATA:  Abdominal pain for 1 month. Dizziness and weakness for several days. EXAM: CT ABDOMEN AND PELVIS WITHOUT CONTRAST TECHNIQUE: Multidetector CT imaging of the abdomen and pelvis was performed following the standard protocol without IV contrast. RADIATION DOSE REDUCTION: This exam was performed according to the departmental dose-optimization program which includes automated exposure control, adjustment of the mA and/or kV according to patient size and/or use of iterative reconstruction technique. COMPARISON:  None. FINDINGS: Lower chest: No acute findings. Hepatobiliary: Severe diffuse hepatic steatosis and hepatomegaly noted. No mass  visualized on this unenhanced exam. Gallbladder is unremarkable. No evidence of biliary ductal dilatation. Pancreas: No mass or inflammatory process visualized on this unenhanced exam. Spleen:  Within normal limits in size. Adrenals/Urinary tract: No evidence of urolithiasis or hydronephrosis. Unremarkable unopacified urinary bladder. Stomach/Bowel: No evidence of obstruction, inflammatory process, or abnormal fluid collections. Normal appendix visualized. Vascular/Lymphatic: No pathologically enlarged lymph nodes identified. No evidence of abdominal aortic aneurysm. Aortic atherosclerotic calcification noted. Reproductive:  No mass or other significant abnormality. Other:  None. Musculoskeletal:  No suspicious bone lesions identified. : Severe hepatic steatosis and hepatomegaly.  No other acute findings. Electronically Signed   By: Danae OrleansJohn A Stahl M.D.   On: 06/29/2021 16:31   DG Chest Port 1 View  Result Date: 06/29/2021 CLINICAL DATA:  Weakness EXAM: PORTABLE CHEST 1 VIEW COMPARISON:  08/13/2017 FINDINGS: The heart size and mediastinal contours are within normal limits. Both lungs are clear. The visualized skeletal structures are unremarkable. IMPRESSION: No acute abnormality of the lungs in AP portable projection. Electronically Signed   By: Jearld LeschAlex D Bibbey M.D.   On: 06/29/2021 16:18    Scheduled Meds:  enoxaparin (LOVENOX) injection  40 mg Subcutaneous Q24H   Continuous Infusions:  sodium chloride     aztreonam Stopped (06/30/21 0859)   vancomycin       LOS: 1 day   Time spent: 35 minutes  Hughie Clossavi Debbrah Sampedro, MD [facility]  06/30/2021, 2:07 PM   *Please note that this is a verbal dictation therefore any spelling or grammatical errors are due to the "Dragon Medical One" system interpretation.  Please page via Amion and do not message via secure chat for urgent patient care matters. Secure chat can be used for non urgent patient care matters.  How to contact the [facility] Attending or  Consulting provider 7A - 7P or covering provider during after hours 7P -7A, for this patient?  Check the care team in [facility] and look for a) attending/consulting [facility] provider listed and b) the [facility] team listed. Page or secure chat 7A-7P. Log into [email or web address] and use [place]'s universal password to access. If you do not have the password, please contact the hospital operator. Locate the [facility] provider you are looking for under [facility] and page to a number that you can be directly reached. If you still have difficulty reaching the provider, please page the [facility] (Director on Call) for the Hospitalists listed on amion for assistance.

## 2021-06-30 NOTE — ED Notes (Signed)
Breakfast order placed ?

## 2021-06-30 NOTE — H&P (View-Only) (Signed)
UNASSIGNED PATIENT ?Reason for Consult: Diarrhea ?Referring Physician: [facility]. ? ?Laura [name] is an 61 y.o. adult.  ?HPI: Laura [name] is a 61 year old female who identifies herself as a female and presents to the hospital with a history of diarrhea that has been problematic for the last 6 to 8 months.  She denies any history of melena but has noticed some bright red blood on the toilet tissue after several loose bowel movements which he attributed to rectal irritation. She denies having any food allergies. She has never had a colonoscopy.  She has been consuming a liquid diet with broth and replacement drinks and feels she is lost some weight But cannot quantify how much weight she is lost. She has had some nocturnal diarrhea as well. She was treated with Flagyl for a few days by her PCP last month with no change in her symptoms.  Patient feels weak and exhausted and has had difficulty ambulating for the last month.  Was found to be hypotensive with blood pressure 80 x 40 in the ER with a hemoglobin of 7.8 which dropped to 5.8 g/dL with hydration.  Multiple electrolyte abnormalities were noted as well aggressive IV hydration and electrolyte replacement was done.  She denies a family history of ulcerative colitis Crohn disease or colon cancer.  GI pathogen panel and C. difficile toxin assay are negative.  Noted to have a hemoglobin today of 7 g/dL with MCV of 601.0.  Lactic acid levels were up on admission. She had a cold EGD done by Dr. Claudette Head in 2014 when she presented with melena was noted to have a Mallory-Weiss tear.  A CTA scan of the abdomen pelvis without contrast done yesterday revealed severe hip hepatic steatosis and hepatomegaly with no other acute findings. ? ?Past Medical History:  ?Diagnosis Date  ? Acute bronchitis   ? Cervical high risk HPV (human papillomavirus) test positive 12/2017  ? Negative subtype 16, 18/45  ? Chronic back pain   ? Complication of anesthesia   ? "didn't wake up"   ? Cough   ? Insomnia, unspecified   ? Other malaise and fatigue   ? Other specified diseases of blood and blood-forming organs(289.89)   ? Tobacco use disorder   ? ?Past Surgical History:  ?Procedure Laterality Date  ? ESOPHAGOGASTRODUODENOSCOPY N/A 03/17/2013  ? Procedure: ESOPHAGOGASTRODUODENOSCOPY (EGD);  Surgeon: Meryl Dare, MD;  Location: [facility] ENDOSCOPY;  Service: Endoscopy;  Laterality: N/A;  ? FRACTURE SURGERY    ? Laminectomy and foraminotomy at L5-S1    12/12/2019  ? TONSILLECTOMY    ? ? ?Family History  ?Problem Relation Age of Onset  ? Arthritis Mother   ? Hypertension Sister   ? Hypertension Brother   ? Hypertension Sister   ? Hypertension Sister   ? Hypertension Sister   ? ?Social History:  reports that he has been smoking cigarettes. He has a 6.00 pack-year smoking history. He has never used smokeless tobacco. He reports that he does not currently use alcohol. He reports that he does not use drugs. ? ?Allergies:  ?Allergies  ?Allergen Reactions  ? Aspirin Hives  ? Lyrica [Pregabalin]   ?  Nightmares, did not help neuropathy  ? Penicillins Hives  ? ?Medications: I have reviewed the patient's current medications. ?Prior to Admission:  ?Medications Prior to Admission  ?Medication Sig Dispense Refill Last Dose  ? albuterol (VENTOLIN HFA) 108 (90 Base) MCG/ACT inhaler INHALE 2 PUFFS INTO THE LUNGS EVERY 6 HOURS AS NEEDED FOR WHEEZING  OR SHORTNESS OF BREATH (Patient taking differently: Inhale 1 puff into the lungs every 6 (six) hours as needed for shortness of breath.) 25.5 g 3 06/28/2021  ? calcium carbonate (CALCIUM 600) 600 MG TABS tablet Take 1 tablet (600 mg total) by mouth 2 (two) times daily with a meal. 60 tablet 3 06/28/2021  ? diphenhydrAMINE (BENADRYL) 25 MG tablet Take 25 mg by mouth every 6 (six) hours as needed for allergies.   Past Week  ? folic acid (FOLVITE) 1 MG tablet Take 2 tablets (2 mg total) by mouth daily. 60 tablet 5 06/28/2021  ? losartan (COZAAR) 25 MG tablet TAKE 1 TABLET(25 MG)  BY MOUTH DAILY (Patient taking differently: Take 25 mg by mouth daily.) 90 tablet 1 06/28/2021  ? oxyCODONE-acetaminophen (PERCOCET/ROXICET) 5-325 MG tablet Take one tablet by mouth every 8-12 hours as needed for pain. (Patient taking differently: Take 1 tablet by mouth every 6 (six) hours as needed for moderate pain.) 90 tablet 0 06/29/2021  ? potassium chloride (KLOR-CON) 20 MEQ tablet Take 1 tablet (20 mEq total) by mouth daily. 3 tablet 0 06/28/2021  ? Tetrahydrozoline HCl (VISINE OP) Place 1 drop into both eyes daily as needed (For dry eyes).   Past Week  ? trimethoprim-polymyxin b (POLYTRIM) ophthalmic solution Place 1 drop into both eyes QID. (Patient not taking: Reported on 06/29/2021) 10 mL 0 Not Taking  ? ?Scheduled: ? enoxaparin (LOVENOX) injection  40 mg Subcutaneous Q24H  ? folic acid  1 mg Oral Daily  ? ?Continuous: ? sodium chloride    ? aztreonam 2 g (06/30/21 1729)  ? vancomycin 1,750 mg (06/30/21 1902)  ? ?PJK:[id number] **OR** acetaminophen, ondansetron **OR** ondansetron (ZOFRAN) IV, oxyCODONE-acetaminophen ? ?Results for orders placed or performed during the hospital encounter of 06/29/21 (from the past 48 hour(s))  ?Resp Panel by RT-PCR (Flu A&B, Covid) Nasopharyngeal Swab     Status: None  ? Collection Time: 06/29/21  3:29 PM  ? Specimen: Nasopharyngeal Swab; Nasopharyngeal(NP) swabs in vial transport medium  ?Result Value Ref Range  ? SARS Coronavirus 2 by RT PCR NEGATIVE NEGATIVE  ?  Comment: (NOTE) ?SARS-CoV-2 target nucleic acids are NOT DETECTED. ? ?The SARS-CoV-2 RNA is generally detectable in upper respiratory ?specimens during the acute phase of infection. The lowest ?concentration of SARS-CoV-2 viral copies this assay can detect is ?138 copies/mL. A negative result does not preclude SARS-Cov-2 ?infection and should not be used as the sole basis for treatment or ?other patient management decisions. A negative result may occur with  ?improper specimen collection/handling, submission of  specimen other ?than nasopharyngeal swab, presence of viral mutation(s) within the ?areas targeted by this assay, and inadequate number of viral ?copies(<138 copies/mL). A negative result must be combined with ?clinical observations, patient history, and epidemiological ?information. The expected result is Negative. ? ?Fact Sheet for Patients:  ?BloggerCourse.com ? ?Fact Sheet for Healthcare Providers:  ?SeriousBroker.it ? ?This test is no t yet approved or cleared by the [place] FDA and  ?has been authorized for detection and/or diagnosis of SARS-CoV-2 by ?FDA under an Emergency Use Authorization (EUA). This EUA will remain  ?in effect (meaning this test can be used) for the duration of the ?COVID-19 declaration under Section 564(b)(1) of the Act, 21 ?U.S.C.section 360bbb-3(b)(1), unless the authorization is terminated  ?or revoked sooner.  ? ? ?  ? Influenza A by PCR NEGATIVE NEGATIVE  ? Influenza B by PCR NEGATIVE NEGATIVE  ?  Comment: (NOTE) ?The Xpert Xpress SARS-CoV-2/FLU/RSV plus assay is  intended as an aid ?in the diagnosis of influenza from Nasopharyngeal swab specimens and ?should not be used as a sole basis for treatment. Nasal washings and ?aspirates are unacceptable for Xpert Xpress SARS-CoV-2/FLU/RSV ?testing. ? ?Fact Sheet for Patients: ?BloggerCourse.comhttps://www.fda.gov/media/152166/download ? ?Fact Sheet for Healthcare Providers: ?SeriousBroker.ithttps://www.fda.gov/media/152162/download ? ?This test is not yet approved or cleared by the [place] FDA and ?has been authorized for detection and/or diagnosis of SARS-CoV-2 by ?FDA under an Emergency Use Authorization (EUA). This EUA will remain ?in effect (meaning this test can be used) for the duration of the ?COVID-19 declaration under Section 564(b)(1) of the Act, 21 U.S.C. ?section 360bbb-3(b)(1), unless the authorization is terminated or ?revoked. ? ?Performed at [facility], [address] [place],  [place] ?[id number] ?  ?Protime-INR     Status: None  ? Collection Time: 06/29/21  3:58 PM  ?Result Value Ref Range  ? Prothrombin Time 15.1 11.4 - 15.2 seconds  ? INR 1.2 0.8 - 1.2  ?  Comment: (NOTE) ?INR goal v

## 2021-06-30 NOTE — Progress Notes (Signed)
HOSPITAL MEDICINE OVERNIGHT EVENT NOTE   ? ?Followed up on evening labs per my discussion with day provider. ? ?Notably, patient is a magnesium of 1.3 and potassium of 2.8.  4 g of intravenous magnesium sulfate and 40 mill equivalents of potassium chloride every 4 hours x2 doses have been ordered. ? ?Posttransfusion hemoglobin is noted to be 8.6, up from 5.8.  Patient is hemodynamically stable. ? ?[name]  MD ?[facility]  ? ? ? ? ? ? ? ? ? ? ? ?

## 2021-07-01 ENCOUNTER — Inpatient Hospital Stay (HOSPITAL_COMMUNITY): Payer: Medicare Other | Admitting: Anesthesiology

## 2021-07-01 ENCOUNTER — Encounter (HOSPITAL_COMMUNITY)
Admission: EM | Disposition: A | Discharge status: Home Health | Payer: Self-pay | Source: Home / Self Care | Attending: Family Medicine

## 2021-07-01 DIAGNOSIS — D649 Anemia, unspecified: Secondary | ICD-10-CM

## 2021-07-01 DIAGNOSIS — I10 Essential (primary) hypertension: Secondary | ICD-10-CM

## 2021-07-01 DIAGNOSIS — K529 Noninfective gastroenteritis and colitis, unspecified: Secondary | ICD-10-CM

## 2021-07-01 DIAGNOSIS — M1990 Unspecified osteoarthritis, unspecified site: Secondary | ICD-10-CM

## 2021-07-01 HISTORY — PX: ESOPHAGOGASTRODUODENOSCOPY (EGD) WITH PROPOFOL: SHX5813

## 2021-07-01 HISTORY — PX: COLONOSCOPY WITH PROPOFOL: SHX5780

## 2021-07-01 HISTORY — PX: BIOPSY: SHX5522

## 2021-07-01 LAB — BASIC METABOLIC PANEL
Anion gap: 7 (ref 5–15)
BUN: <5 mg/dL — ABNORMAL LOW (ref 6–20)
CO2: 25 mmol/L (ref 22–32)
Calcium: 7.8 mg/dL — ABNORMAL LOW (ref 8.9–10.3)
Chloride: 103 mmol/L (ref 98–111)
Creatinine, Ser: 0.4 mg/dL — ABNORMAL LOW (ref 0.44–1.00)
GFR, Estimated: >60 mL/min (ref >60)
Glucose, Bld: 84 mg/dL (ref 70–99)
Potassium: 3.8 mmol/L (ref 3.5–5.1)
Sodium: 135 mmol/L (ref 135–145)

## 2021-07-01 LAB — CBC
HCT: 25.6 % — ABNORMAL LOW (ref 36.0–46.0)
Hemoglobin: 8.3 g/dL — ABNORMAL LOW (ref 12.0–15.0)
MCH: 34.2 pg — ABNORMAL HIGH (ref 26.0–34.0)
MCHC: 32.4 g/dL (ref 30.0–36.0)
MCV: 105.3 fL — ABNORMAL HIGH (ref 80.0–100.0)
Platelets: 158 10*3/uL (ref 150–400)
RBC: 2.43 MIL/uL — ABNORMAL LOW (ref 3.87–5.11)
WBC: 5 10*3/uL (ref 4.0–10.5)
nRBC: 0 % (ref 0.0–0.2)

## 2021-07-01 LAB — LACTIC ACID, PLASMA: Lactic Acid, Venous: 2 mmol/L (ref 0.5–1.9)

## 2021-07-01 SURGERY — ESOPHAGOGASTRODUODENOSCOPY (EGD) WITH PROPOFOL
Anesthesia: Monitor Anesthesia Care

## 2021-07-01 MED ORDER — PROPOFOL 500 MG/50ML IV EMUL
INTRAVENOUS | Status: DC | PRN
  Administered 2021-07-01: 200 ug/kg/min via INTRAVENOUS

## 2021-07-01 MED ORDER — LACTATED RINGERS IV SOLN: INTRAVENOUS | Status: DC

## 2021-07-01 MED ORDER — ADULT MULTIVITAMIN W/MINERALS CH
1.0000 | ORAL_TABLET | Freq: Every day | ORAL | Status: DC
  Administered 2021-07-01 – 2021-07-02 (×2): 1 via ORAL
  Filled 2021-07-01 (×3): qty 1

## 2021-07-01 MED ORDER — PROPOFOL 10 MG/ML IV BOLUS
INTRAVENOUS | Status: DC | PRN
  Administered 2021-07-01 (×3): 20 mg via INTRAVENOUS

## 2021-07-01 MED ORDER — LIDOCAINE 2% (20 MG/ML) 5 ML SYRINGE
INTRAMUSCULAR | Status: DC | PRN
  Administered 2021-07-01: 100 mg via INTRAVENOUS

## 2021-07-01 SURGICAL SUPPLY — 25 items

## 2021-07-01 NOTE — Op Note (Signed)
[facility] ?Patient Name: Laura KiefKathy [name] ?Procedure Date : 07/01/2021 ?MRN: [id number] ?Attending MD: Jeani HawkingPatrick Affie Gasner , MD ?Date of Birth: 12/04/1960 ?CSN: [id number] ?Age: [age] ?Admit Type: Outpatient ?Procedure:                Upper GI endoscopy ?Indications:              Diarrhea ?Providers:                Jeani HawkingPatrick Marlisha Vanwyk, MD, Lorenza EvangelistMeredith Walton, RN, Ethelene BrownsAnthony  ?                          Cheryll DessertGillies, Technician ?Referring MD:              ?Medicines:                Propofol per Anesthesia ?Complications:            No immediate complications. ?Estimated Blood Loss:     Estimated blood loss: none. ?Procedure:                Pre-Anesthesia Assessment: ?                          - Prior to the procedure, a History and Physical  ?                          was performed, and patient medications and  ?                          allergies were reviewed. The patient's tolerance of  ?                          previous anesthesia was also reviewed. The risks  ?                          and benefits of the procedure and the sedation  ?                          options and risks were discussed with the patient.  ?                          All questions were answered, and informed consent  ?                          was obtained. Prior Anticoagulants: The patient has  ?                          taken no previous anticoagulant or antiplatelet  ?                          agents. ASA Grade Assessment: III - A patient with  ?                          severe systemic disease. After reviewing the risks  ?                          and  benefits, the patient was deemed in  ?                          satisfactory condition to undergo the procedure. ?                          - Sedation was administered by an anesthesia  ?                          professional. Deep sedation was attained. ?                          After obtaining informed consent, the endoscope was  ?                          passed under direct vision. Throughout the  ?                           procedure, the patient's blood pressure, pulse, and  ?                          oxygen saturations were monitored continuously. The  ?                          PCF-190TL ([id number]) Olympus colonoscope was  ?                          introduced through the mouth, and advanced to the  ?                          second part of duodenum. The upper GI endoscopy was  ?                          accomplished without difficulty. The patient  ?                          tolerated the procedure well. ?Scope In: ?Scope Out: ?Findings: ?     The esophagus was normal. ?     The stomach was normal. ?     The examined duodenum was normal. Biopsies for histology were taken with  ?     a cold forceps for evaluation of celiac disease. ?Impression:               - Normal esophagus. ?                          - Normal stomach. ?                          - Normal examined duodenum. Biopsied. ?Recommendation:           - Await pathology results. ?                          - Proceed with the colonoscopy. ?Procedure Code(s):        --- Professional --- ?  15726, Esophagogastroduodenoscopy, flexible,  ?                          transoral; with biopsy, single or multiple ?Diagnosis Code(s):        --- Professional --- ?                          R19.7, Diarrhea, unspecified ?CPT copyright 2019 American Medical Association. All rights reserved. ?The codes documented in this report are preliminary and upon coder review may  ?be revised to meet current compliance requirements. ?Jeani Hawking, MD ?Jeani Hawking, MD ?07/01/2021 3:58:56 PM ?This report has been signed electronically. ?Number of Addenda: 0 ?

## 2021-07-01 NOTE — Progress Notes (Signed)
Pt offered tylenol on MAR in addition to percocet but refused stated no but thank you and did not receive the additional medication on MAR.  ?

## 2021-07-01 NOTE — Op Note (Signed)
[facility] ?Patient Name: Laura KiefKathy Melkonian ?Procedure Date : 07/01/2021 ?MRN: [id number] ?Attending MD: Jeani HawkingPatrick Makail Watling , MD ?Date of Birth: 12/04/1960 ?CSN: [id number] ?Age: [age] ?Admit Type: Inpatient ?Procedure:                Colonoscopy ?Indications:              Chronic diarrhea ?Providers:                Jeani HawkingPatrick Tanaya Dunigan, MD, Lorenza EvangelistMeredith Walton, RN, Ethelene BrownsAnthony  ?                          Cheryll DessertGillies, Technician ?Referring MD:              ?Medicines:                Propofol per Anesthesia ?Complications:            No immediate complications. ?Estimated Blood Loss:     Estimated blood loss: none. ?Procedure:                Pre-Anesthesia Assessment: ?                          - Prior to the procedure, a History and Physical  ?                          was performed, and patient medications and  ?                          allergies were reviewed. The patient's tolerance of  ?                          previous anesthesia was also reviewed. The risks  ?                          and benefits of the procedure and the sedation  ?                          options and risks were discussed with the patient.  ?                          All questions were answered, and informed consent  ?                          was obtained. Prior Anticoagulants: The patient has  ?                          taken no previous anticoagulant or antiplatelet  ?                          agents. ASA Grade Assessment: III - A patient with  ?                          severe systemic disease. After reviewing the risks  ?                          and benefits,  the patient was deemed in  ?                          satisfactory condition to undergo the procedure. ?                          - Sedation was administered by an anesthesia  ?                          professional. Deep sedation was attained. ?                          After obtaining informed consent, the colonoscope  ?                          was passed under direct vision. Throughout the  ?                           procedure, the patient's blood pressure, pulse, and  ?                          oxygen saturations were monitored continuously. The  ?                          PCF-190TL ([id number]) Olympus colonoscope was  ?                          introduced through the anus and advanced to the the  ?                          cecum, identified by appendiceal orifice and  ?                          ileocecal valve. The colonoscopy was performed  ?                          without difficulty. The patient tolerated the  ?                          procedure well. The quality of the bowel  ?                          preparation was evaluated using the BBPS [facility]  ?                          Bowel Preparation Scale) with scores of: Right  ?                          Colon = 2 (minor amount of residual staining, small  ?                          fragments of stool and/or opaque liquid, but mucosa  ?  seen well), Transverse Colon = 2 (minor amount of  ?                          residual staining, small fragments of stool and/or  ?                          opaque liquid, but mucosa seen well) and Left Colon  ?                          = 2 (minor amount of residual staining, small  ?                          fragments of stool and/or opaque liquid, but mucosa  ?                          seen well). The total BBPS score equals 6. The  ?                          quality of the bowel preparation was good. The  ?                          ileocecal valve, appendiceal orifice, and rectum  ?                          were photographed. ?Scope In: 2:43:36 PM ?Scope Out: 2:52:17 PM ?Scope Withdrawal Time: 0 hours 6 minutes 26 seconds  ?Total Procedure Duration: 0 hours 8 minutes 41 seconds  ?Findings: ?     Normal mucosa was found in the entire colon. Biopsies for histology were  ?     taken with a cold forceps from the entire colon for evaluation of  ?     microscopic colitis. ?Impression:               -  Normal mucosa in the entire examined colon.  ?                          Biopsied. ?Recommendation:           - Return patient to hospital ward for ongoing care. ?                          - Resume regular diet. ?                          - Continue present medications. ?                          - Await pathology results. ?                          - Repeat colonoscopy in 10 years for screening  ?                          purposes. ?Procedure Code(s):        --- Professional --- ?  16109, Colonoscopy, flexible; with biopsy, single  ?                          or multiple ?Diagnosis Code(s):        --- Professional --- ?                          K52.9, Noninfective gastroenteritis and colitis,  ?                          unspecified ?CPT copyright 2019 American Medical Association. All rights reserved. ?The codes documented in this report are preliminary and upon coder review may  ?be revised to meet current compliance requirements. ?Jeani Hawking, MD ?Jeani Hawking, MD ?07/01/2021 3:56:40 PM ?This report has been signed electronically. ?Number of Addenda: 0 ?

## 2021-07-01 NOTE — Progress Notes (Signed)
Date and time results received: 07/01/21 12:09 ? ?Test: Lactic Acid ?Critical Value: 2.0 ? ?Name of Provider Notified: Hughie Closs ? ?

## 2021-07-01 NOTE — Interval H&P Note (Signed)
History and Physical Interval Note: ? ?07/01/2021 ?1:08 PM ? ?Laura [name]  has presented today for surgery, with the diagnosis of diarrhea.  The various methods of treatment have been discussed with the patient and family. After consideration of risks, benefits and other options for treatment, the patient has consented to  Procedure(s): ?ESOPHAGOGASTRODUODENOSCOPY (EGD) WITH PROPOFOL (N/A) ?COLONOSCOPY WITH PROPOFOL (N/A) as a surgical intervention.  The patient's history has been reviewed, patient examined, no change in status, stable for surgery.  I have reviewed the patient's chart and labs.  Questions were answered to the patient's satisfaction.   ? ? ?Napolean Sia D ? ? ?

## 2021-07-01 NOTE — Transfer of Care (Signed)
Immediate Anesthesia Transfer of Care Note ? ?Patient: Lonnetta Kniskern ? ?Procedure(s) Performed: ESOPHAGOGASTRODUODENOSCOPY (EGD) WITH PROPOFOL ?COLONOSCOPY WITH PROPOFOL ?BIOPSY ? ?Patient Location: PACU ? ?Anesthesia Type:MAC ? ?Level of Consciousness: drowsy and patient cooperative ? ?Airway & Oxygen Therapy: Patient Spontanous Breathing and Patient connected to nasal cannula oxygen ? ?Post-op Assessment: Report given to RN, Post -op Vital signs reviewed and stable and Patient moving all extremities ? ?Post vital signs: Reviewed and stable ? ?Last Vitals:  ?Vitals Value Taken Time  ?BP    ?Temp    ?Pulse    ?Resp 15 07/01/21 1501  ?SpO2    ?Vitals shown include unvalidated device data. ? ?Last Pain:  ?Vitals:  ? 07/01/21 1230  ?TempSrc: Temporal  ?PainSc: 0-No pain  ?   ? ?  ? ?Complications: No notable events documented. ?

## 2021-07-01 NOTE — Progress Notes (Addendum)
Pt reassessed for pain relief and states she had no relief but is ok with the medication and does not want anymore states she is not going to take herself thru that and the doctor will not give her anymore pain med anyways. Pt did not want nurse to notify md to see if there was anything else he could have for pain. Nurse asked pt to let her know if he changes his mind and would need something else for pain and pt nodded yes he would let the nurse know if he needed her to call the doctor for more pain medication due to it giving no relief for her pain . Pain level remains 8/10 back area  ?

## 2021-07-01 NOTE — Progress Notes (Signed)
?PROGRESS NOTE ? ? ? Laura [name]  JJO:[id number] DOB: 04-01-1961 DOA: 06/29/2021 ?PCP: Laura Seller, NP ? ? ?Brief Narrative:  ?Laura [name] is a 61 y.o. adult female who identifies as female with medical history significant of HTN who was in his usual state of reasonable health until mid November 2022 when he developed diarrhea.  Patient is unable to provide a specific history as he does not keep track of dates.  He states he initially developed nausea and vomiting "I had to keep a bucket beside my bed" in mid November but then subsequently progressed to having watery diarrhea.  He states that the vomiting subsided once the diarrhea started.  Notes that the diarrhea waxes and wanes but has been present over the past 3 to 4 months.  Patient is not sure if the diarrhea is worse when he eats however he has cut down on his p.o. intake and is only drinking chicken broth and meal replacement drinks.  He thinks he has lost weight but is not sure how much "my close fall off me".  Patient notes he gets up multiple times at night with diarrhea.  On bad days he has diarrhea 5-7 times a day, on better days he has diarrhea 2-3 times a day.  Patient was seen by his PCP last month and was treated with Flagyl with no effect. ?  ?Patient has been feeling weak and tired with difficulty ambulating for the past 2 to 3 weeks.  2 days ago he had an episode of syncope, did not hit his head, does not know how long he was out but there was family around and he was not confused when he came to.  Today he noted he was unable to walk back and forth to the bathroom without being severely exhausted. ?  ?Patient has not been started on any new medications, denies any travel out of the country and states that he only drinks bottled water.  Patient denies alcohol use. ?  ?Patient denies any blood in the stool.  Notes that it is watery, yellow or brown.  Did have 1 episode of bright red blood per rectum on the toilet paper but never in the toilet.   He attributed this to raw anal skin. ?  ?Patient's initial blood pressures were 80s over 40s with initial hemoglobin of 7.8 which decreased to 5.8 with hydration.  Stool guaiac was negative although there was not much stool in the rectal vault he had multiple electrolyte abnormalities including hypokalemia, hypomagnesemia and hypocalcemia.  He was treated with IV fluid resuscitation, aggressive electrolyte repletion and initiation of transfusion. ? ?Assessment & Plan: ?  ?Principal Problem: ?  Lactic acidosis ?Active Problems: ?  Obesity (BMI 30.0-34.9) ?  Macrocytic anemia ?  Hypotension ?  Hypokalemia ?  Hypomagnesemia ? ?Hypotension/lactic acidosis/dehydration: Likely secondary to chronic diarrhea with decreased p.o. intake.  Antibiotic discontinued, no sign of infection.  Patient appears to be better hydrated today.  Lactic acid also improving from 4 yesterday to 2 today.  Blood pressure fairly controlled as well. ?  ?Subacute diarrhea for 3 to 4 months: GI board.  C. difficile and GI pathogen panel negative.  Patient is scheduled for colonoscopy and EGD today. ?  ?Macrocytic anemia/folate deficiency: Presented with hemoglobin of 5.8.  Received 2 units of PRBC.  Hemoglobin improved to 8.3.  FOBT x2 negative.  GI on board.  Plan for EGD.  Continue folate. ? ?Chronic thrombocytopenia: Stable. ? ?Hypokalemia: Resolved. ? ?Hypomagnesemia: Resolved. ?  ?  Hypocalcemia: Improving. ?  ?HTN: of note patient has continued to take losartan throughout her course of illness.  Currently on hold.  Blood pressure controlled. ?  ?Chronic lower back pain: ?Continue as needed Percocet per home doses. ? ?DVT prophylaxis: enoxaparin (LOVENOX) injection 40 mg Start: 06/30/21 1200 ?  Code Status: Full Code  ?Family Communication:  None present at bedside.  Plan of care discussed with patient in length and he/she verbalized understanding and agreed with it. ? ?Status is: Inpatient ?Remains inpatient appropriate because: Needs further  work-up of chronic diarrhea and anemia. ? ? ?Estimated body mass index is 31.55 kg/m? as calculated from the following: ?  Height as of this encounter: 5\' 5"  (1.651 m). ?  Weight as of this encounter: 86 kg. ? ?  ?Nutritional Assessment: ?Body mass index is 31.55 kg/m? Marland Kitchen ?Seen by dietician.  I agree with the assessment and plan as outlined below: ?Nutrition Status: ?  ?  ?  ? ?. ?Skin Assessment: ?I have examined the patient's skin and I agree with the wound assessment as performed by the wound care RN as outlined below: ?  ? ?Consultants:  ?GI ? ?Procedures:  ?None ? ?Antimicrobials:  ?Anti-infectives (From admission, onward)  ? ? Start     Dose/Rate Route Frequency Ordered Stop  ? 06/30/21 1800  vancomycin (VANCOREADY) IVPB 1750 mg/350 mL  Status:  Discontinued       ? 1,750 mg ?175 mL/hr over 120 Minutes Intravenous Every 24 hours 06/29/21 1834 07/01/21 1111  ? 06/30/21 0200  aztreonam (AZACTAM) 2 g in sodium chloride 0.9 % 100 mL IVPB  Status:  Discontinued       ? 2 g ?200 mL/hr over 30 Minutes Intravenous Every 8 hours 06/29/21 1834 07/01/21 1111  ? 06/29/21 1730  vancomycin (VANCOREADY) IVPB 1750 mg/350 mL       ? 1,750 mg ?175 mL/hr over 120 Minutes Intravenous  Once 06/29/21 1719 06/29/21 2021  ? 06/29/21 1715  aztreonam (AZACTAM) 2 g in sodium chloride 0.9 % 100 mL IVPB       ? 2 g ?200 mL/hr over 30 Minutes Intravenous  Once 06/29/21 1714 06/29/21 1823  ? 06/29/21 1715  metroNIDAZOLE (FLAGYL) IVPB 500 mg       ? 500 mg ?100 mL/hr over 60 Minutes Intravenous  Once 06/29/21 1714 06/29/21 1919  ? 06/29/21 1715  vancomycin (VANCOCIN) IVPB 1000 mg/200 mL premix  Status:  Discontinued       ? 1,000 mg ?200 mL/hr over 60 Minutes Intravenous  Once 06/29/21 1714 06/29/21 1719  ? ?  ?  ? ? ?Subjective: ?Patient seen and examined.  He has no complaints. ? ?Objective: ?Vitals:  ? 06/30/21 1939 07/01/21 0342 07/01/21 0800 07/01/21 1230  ?BP: 116/70 106/60 122/72 121/73  ?Pulse: 98 75 70 75  ?Resp: 19 (!) 21 17 18    ?Temp: 98.3 ?F (36.8 ?C) 97.7 ?F (36.5 ?C) 97.7 ?F (36.5 ?C) (!) 96.4 ?F (35.8 ?C)  ?TempSrc: Oral Oral Oral Temporal  ?SpO2: 98% 99% 100% 100%  ?Weight:      ?Height:      ? ? ?Intake/Output Summary (Last 24 hours) at 07/01/2021 1327 ?Last data filed at 07/01/2021 0909 ?Gross per 24 hour  ?Intake 3025.69 ml  ?Output 700 ml  ?Net 2325.69 ml  ? ? ?Filed Weights  ? 06/29/21 1617  ?Weight: 86 kg  ? ? ?Examination: ? ?General exam: Appears calm and comfortable  ?Respiratory system: Clear to auscultation. Respiratory effort normal. ?  Cardiovascular system: S1 & S2 heard, RRR. No JVD, murmurs, rubs, gallops or clicks. No pedal edema. ?Gastrointestinal system: Abdomen is nondistended, soft and nontender. No organomegaly or masses felt. Normal bowel sounds heard. ?Central nervous system: Alert and oriented. No focal neurological deficits. ?Extremities: Symmetric 5 x 5 power. ?Skin: No rashes, lesions or ulcers.  ? ? ?Data Reviewed: I have personally reviewed following labs and imaging studies ? ?CBC: ?Recent Labs  ?Lab 06/29/21 ?1611 06/29/21 ?1706 06/30/21 ?0005 06/30/21 ?[id number] 07/01/21 ?[id number]  ?WBC  --  7.2 6.0 6.1 5.0  ?HGB 7.5* 5.8* 8.6* 7.0* 8.3*  ?HCT 22.0* 17.8* 25.3* 21.4* 25.6*  ?MCV  --  124.5* 102.8* 102.9* 105.3*  ?PLT  --  168 131* 147* 158  ? ? ?Basic Metabolic Panel: ?Recent Labs  ?Lab 06/29/21 ?1720 06/30/21 ?0005 06/30/21 ?[id number] 06/30/21 ?0752 06/30/21 ?1145 07/01/21 ?[id number]  ?NA 135 137 136  --  136 135  ?K 2.8* 2.8* 2.7*  --  4.6 3.8  ?CL 97* 98 100  --  102 103  ?CO2 25 28 28   --  23 25  ?GLUCOSE 89 100* 104*  --  87 84  ?BUN <5* <5* <5*  --  <5* <5*  ?CREATININE 0.56 0.60 0.51  --  0.57 0.40*  ?CALCIUM 6.6* 7.2* 7.2*  --  7.3* 7.8*  ?MG  --  1.3*  --  2.2  --   --   ?PHOS  --  2.4*  --   --   --   --   ? ? ?GFR: ?Estimated Creatinine Clearance (by C-G formula based on SCr of 0.4 mg/dL (L)) ?Female: 83 mL/min (A) ?Female: 101.5 mL/min (A) ?Liver Function Tests: ?Recent Labs  ?Lab 06/29/21 ?1720  ?AST 34  ?ALT 17   ?ALKPHOS 176*  ?BILITOT 2.7*  ?PROT 4.6*  ?ALBUMIN 2.2*  ? ? ?No results for input(s): LIPASE, AMYLASE in the last 168 hours. ?No results for input(s): AMMONIA in the last 168 hours. ?Coagulation Profile:

## 2021-07-01 NOTE — Anesthesia Postprocedure Evaluation (Signed)
Anesthesia Post Note ? ?Patient: Laura [name] ? ?Procedure(s) Performed: ESOPHAGOGASTRODUODENOSCOPY (EGD) WITH PROPOFOL ?COLONOSCOPY WITH PROPOFOL ?BIOPSY ? ?  ? ?Patient location during evaluation: PACU ?Anesthesia Type: MAC ?Level of consciousness: awake and alert ?Pain management: pain level controlled ?Vital Signs Assessment: post-procedure vital signs reviewed and stable ?Respiratory status: spontaneous breathing, nonlabored ventilation, respiratory function stable and patient connected to nasal cannula oxygen ?Cardiovascular status: stable and blood pressure returned to baseline ?Postop Assessment: no apparent nausea or vomiting ?Anesthetic complications: no ? ? ?No notable events documented. ? ?Last Vitals:  ?Vitals:  ? 07/01/21 1545 07/01/21 1621  ?BP: 137/87 131/85  ?Pulse: 68 78  ?Resp: 20 15  ?Temp:  36.4 ?C  ?SpO2: 100% 96%  ?  ?Last Pain:  ?Vitals:  ? 07/01/21 1621  ?TempSrc: Oral  ?PainSc:   ? ? ?  ?  ?  ?  ?  ?  ? ?Canyon Lohr L Franziska Podgurski ? ? ? ? ?

## 2021-07-01 NOTE — Anesthesia Preprocedure Evaluation (Addendum)
Anesthesia Evaluation  ?Patient identified by MRN, date of birth, ID band ?Patient awake ? ? ? ?Reviewed: ?Allergy & Precautions, NPO status , Patient's Chart, lab work & pertinent test results ? ?Airway ?Mallampati: I ? ?TM Distance: >3 FB ?Neck ROM: Full ? ? ? Dental ? ?(+) Edentulous Upper, Loose, Dental Advisory Given, Missing,  ?  ?Pulmonary ?neg pulmonary ROS, Current Smoker and Patient abstained from smoking.,  ?  ?Pulmonary exam normal ?breath sounds clear to auscultation ? ? ? ? ? ? Cardiovascular ?hypertension, Pt. on medications ?Normal cardiovascular exam ?Rhythm:Regular Rate:Normal ? ? ?  ?Neuro/Psych ?Anxiety Depression negative neurological ROS ? negative psych ROS  ? GI/Hepatic ?negative GI ROS, Neg liver ROS,   ?Endo/Other  ?negative endocrine ROS ? Renal/GU ?negative Renal ROS  ?negative genitourinary ?  ?Musculoskeletal ? ?(+) Arthritis , Osteoarthritis,   ? Abdominal ?(+) + obese,   ?Peds ?negative pediatric ROS ?(+)  Hematology ? ?(+) Blood dyscrasia, anemia , Lab Results ?     Component                Value               Date                 ?     WBC                      5.0                 07/01/2021           ?     HGB                      8.3 (L)             07/01/2021           ?     HCT                      25.6 (L)            07/01/2021           ?     MCV                      105.3 (H)           07/01/2021           ?     PLT                      158                 07/01/2021           ?   ?Anesthesia Other Findings ? ? Reproductive/Obstetrics ?negative OB ROS ? ?  ? ? ? ? ? ? ? ? ? ? ? ? ? ?  ?  ? ? ? ? ? ? ?Anesthesia Physical ?Anesthesia Plan ? ?ASA: 2 ? ?Anesthesia Plan: MAC  ? ?Post-op Pain Management: Minimal or no pain anticipated  ? ?Induction: Intravenous ? ?PONV Risk Score and Plan: 1 and Treatment may vary due to age or medical condition and Propofol infusion ? ?Airway Management Planned: Nasal Cannula and Natural Airway ? ?Additional  Equipment:  ? ?Intra-op Plan:  ? ?Post-operative Plan:  ? ?Informed Consent: I have reviewed the patients History and Physical,  chart, labs and discussed the procedure including the risks, benefits and alternatives for the proposed anesthesia with the patient or authorized representative who has indicated his/her understanding and acceptance.  ? ? ? ?Dental advisory given ? ?Plan Discussed with: CRNA ? ?Anesthesia Plan Comments:   ? ? ? ? ? ?Anesthesia Quick Evaluation ? ?

## 2021-07-01 NOTE — Progress Notes (Signed)
Initial Nutrition Assessment ? ?DOCUMENTATION CODES:  ? ?Not applicable ? ?INTERVENTION:  ? ?Multivitamin w/ minerals daily ?As diet advances RD to order appropriate ONS ? ?NUTRITION DIAGNOSIS:  ? ?Inadequate oral intake related to   as evidenced by NPO status. ? ?GOAL:  ? ?Patient will meet greater than or equal to 90% of their needs ? ?MONITOR:  ? ?Diet advancement, Labs, Weight trends ? ?REASON FOR ASSESSMENT:  ? ?Malnutrition Screening Tool ?  ? ?ASSESSMENT:  ? ?61 y.o. female who identifies herself as a female presented to the ED with ongoing diarrhea for 3-4 months. PMH includes HTN. Pt admitted with hypotension, subacute diarrhea, anemia, and electrolyte abnormalities.  ? ?Pt resting in bed. Pt reports that his appetite was good at home and currently. Pt reports that as long as he was lying down he would not experience any nausea. Pt reports that the nausea and diarrhea had been going on for a while. Pt reports that he was drinking 1 supplement per day at home as a meal replacement.  ? ?Pt unsure of UBW. Reports that he has had weight loss, unsure of amount, but reported that his clothes are falling right off. Per EMR, pt has not had significant weight loss within the past 6 months. ? ?Pt currently NPO for procedure.  ? ?Medications reviewed and include: Folic acid, IV antibiotics  ?Labs reviewed: BUN <5, Creatinine 0.40, Phosphorus 2.4 ? ?NUTRITION - FOCUSED PHYSICAL EXAM: ? ?Deferred to follow-up.  ? ?Diet Order:   ?Diet Order   ? ?       ?  Diet NPO time specified  Diet effective now       ?  ? ?  ?  ? ?  ? ?EDUCATION NEEDS:  ? ?No education needs have been identified at this time ? ?Skin:  Skin Assessment: Reviewed RN Assessment ? ?Last BM:  3/14 - Type 7 ? ?Height:  ?Ht Readings from Last 1 Encounters:  ?06/29/21 5\' 5"  (1.651 m)  ? ?Weight:  ?Wt Readings from Last 1 Encounters:  ?06/29/21 86 kg  ? ?BMI:  Body mass index is 31.55 kg/m?. ? ?Estimated Nutritional Needs:  ? ?Kcal:  1800-2000 ? ?Protein:   90-105 grams ? ?Fluid:  >/= 1.8 L ? ? ? ?08/29/21 RD, LDN ?Clinical Dietitian ?See [facility] for contact information.  ? ?

## 2021-07-02 ENCOUNTER — Encounter (HOSPITAL_COMMUNITY): Payer: Self-pay | Admitting: Gastroenterology

## 2021-07-02 LAB — BASIC METABOLIC PANEL
Anion gap: 11 (ref 5–15)
BUN: <5 mg/dL — ABNORMAL LOW (ref 6–20)
CO2: 22 mmol/L (ref 22–32)
Calcium: 8 mg/dL — ABNORMAL LOW (ref 8.9–10.3)
Chloride: 104 mmol/L (ref 98–111)
Creatinine, Ser: 0.39 mg/dL — ABNORMAL LOW (ref 0.44–1.00)
GFR, Estimated: >60 mL/min (ref >60)
Glucose, Bld: 84 mg/dL (ref 70–99)
Potassium: 3.9 mmol/L (ref 3.5–5.1)
Sodium: 137 mmol/L (ref 135–145)

## 2021-07-02 LAB — CBC WITH DIFFERENTIAL/PLATELET
Abs Immature Granulocytes: 0.04 10*3/uL (ref 0.00–0.07)
Basophils Absolute: 0.1 10*3/uL (ref 0.0–0.1)
Basophils Relative: 1 %
Eosinophils Absolute: 0.1 10*3/uL (ref 0.0–0.5)
Eosinophils Relative: 1 %
HCT: 26 % — ABNORMAL LOW (ref 36.0–46.0)
Hemoglobin: 8.4 g/dL — ABNORMAL LOW (ref 12.0–15.0)
Immature Granulocytes: 1 %
Lymphocytes Relative: 10 %
Lymphs Abs: 0.6 10*3/uL — ABNORMAL LOW (ref 0.7–4.0)
MCH: 34.3 pg — ABNORMAL HIGH (ref 26.0–34.0)
MCHC: 32.3 g/dL (ref 30.0–36.0)
MCV: 106.1 fL — ABNORMAL HIGH (ref 80.0–100.0)
Monocytes Absolute: 0.3 10*3/uL (ref 0.1–1.0)
Monocytes Relative: 4 %
Neutro Abs: 5.6 10*3/uL (ref 1.7–7.7)
Neutrophils Relative %: 83 %
Platelets: 160 10*3/uL (ref 150–400)
RBC: 2.45 MIL/uL — ABNORMAL LOW (ref 3.87–5.11)
RDW: 30.5 % — ABNORMAL HIGH (ref 11.5–15.5)
WBC: 6.7 10*3/uL (ref 4.0–10.5)
nRBC: 0 % (ref 0.0–0.2)

## 2021-07-02 LAB — TISSUE TRANSGLUTAMINASE, IGA: Tissue Transglutaminase Ab, IgA: <2 U/mL (ref 0–3)

## 2021-07-02 LAB — GLIADIN ANTIBODIES, SERUM
Antigliadin Abs, IgA: 5 units (ref 0–19)
Gliadin IgG: 3 units (ref 0–19)

## 2021-07-02 LAB — CALCIUM, IONIZED: Calcium, Ionized, Serum: 4.3 mg/dL — ABNORMAL LOW (ref 4.5–5.6)

## 2021-07-02 LAB — MAGNESIUM: Magnesium: 1.6 mg/dL — ABNORMAL LOW (ref 1.7–2.4)

## 2021-07-02 MED ORDER — MAGNESIUM OXIDE -MG SUPPLEMENT 400 (240 MG) MG PO TABS: 800.0000 mg | ORAL_TABLET | Freq: Once | ORAL | Status: DC

## 2021-07-02 MED ORDER — LACTATED RINGERS IV BOLUS
500.0000 mL | Freq: Once | INTRAVENOUS | Status: AC
  Administered 2021-07-02: 500 mL via INTRAVENOUS

## 2021-07-02 MED ORDER — MAGNESIUM SULFATE 2 GM/50ML IV SOLN
2.0000 g | Freq: Once | INTRAVENOUS | Status: DC
  Filled 2021-07-02: qty 50

## 2021-07-02 NOTE — Plan of Care (Signed)

## 2021-07-02 NOTE — Progress Notes (Signed)
HOSPITAL MEDICINE OVERNIGHT EVENT NOTE   ? ?Notified by nursing the patient has been  exhibiting a yellow MEWS score as well as downtrending blood pressures.  Blood pressures have been between the 80s and 90s.   ? ?Chart reviewed, patient has been suffering from ongoing diarrhea and this is likely related to volume depletion.  We will administer a 500 cc lactated Ringer's solution bolus and reassess. ? ?[name]  MD ?[facility]  ? ? ? ? ? ? ? ? ? ? ? ?

## 2021-07-02 NOTE — Evaluation (Signed)
Physical Therapy Evaluation ?Patient Details ?Name: Laura [name] ?MRN: [id number] ?DOB: [birth year] ?Today's Date: 07/02/2021 ? ?History of Present Illness ? Pt is a 61 y/o presenting on 06/29/21 with diarrhea for 3-4 months with resultant hypotension, anemia. PMH includes bronchitis, HPV, insomina, chronic back pain with prior sx. ?  ?Clinical Impression ? Pt presents with an overall decrease in functional mobility secondary to above. PTA, pt mod indep with use of SPC for community ambulation. Today, pt ambulatory with RW and intermittent min guard for balance; pt notes feeling weak, appreciates stability of RW. Initiated educ re: activity recommendations, DME needs, d/c recommendations. Pt hopeful for d/c home as soon as possible. If to remain admitted, will follow acutely to address established goals.  ?   ?Orthostatic BPs ?Supine 115/94  ?Sitting 120/67  ?Standing 111/77  ?Post-ambulation 129/88  ? ?HR 70s-80s, SpO2 95-97% on RA   ? ?Recommendations for follow up therapy are one component of a multi-disciplinary discharge planning process, led by the attending physician.  Recommendations may be updated based on patient status, additional functional criteria and insurance authorization. ? ?Follow Up Recommendations Home health PT ? ?  ?Assistance Recommended at Discharge PRN  ?Patient can return home with the following ? Assistance with cooking/housework ? ?  ?Equipment Recommendations Rolling walker (2 wheels);Other (comment) (tub transfer bench)  ?Recommendations for Other Services ?    ?  ?Functional Status Assessment Patient has had a recent decline in their functional status and demonstrates the ability to make significant improvements in function in a reasonable and predictable amount of time.  ? ?  ?Precautions / Restrictions Precautions ?Precautions: Fall ?Restrictions ?Weight Bearing Restrictions: No  ? ?  ? ?Mobility ? Bed Mobility ?Overal bed mobility: Modified Independent ?  ?  ?  ?  ?  ?  ?  ?   ? ?Transfers ?Overall transfer level: Needs assistance ?Equipment used: Rolling walker (2 wheels) ?Transfers: Sit to/from Stand ?Sit to Stand: Min guard, Supervision ?  ?  ?  ?  ?  ?General transfer comment: preference for RW due to feeling weak; initial min guard for balance, progressing to supervision ?  ? ?Ambulation/Gait ?Ambulation/Gait assistance: Min guard, Supervision ?Gait Distance (Feet): 70 Feet ?Assistive device: Rolling walker (2 wheels) ?Gait Pattern/deviations: Step-through pattern, Decreased stride length, Trunk flexed ?Gait velocity: Decreased ?  ?  ?General Gait Details: Slow, fatigued gait with RW and initial min guard for balance, progressing to supervision; pt ambulating laps in room, decilnes hallway ambulation ? ?Stairs ?  ?  ?  ?  ?  ? ?Wheelchair Mobility ?  ? ?Modified Rankin (Stroke Patients Only) ?  ? ?  ? ?Balance Overall balance assessment: Needs assistance ?  ?Sitting balance-Leahy Scale: Good ?  ?  ?  ?Standing balance-Leahy Scale: Fair ?Standing balance comment: can static stand without UE support; preference for RW ?  ?  ?  ?  ?  ?  ?  ?  ?  ?  ?  ?   ? ? ? ?Pertinent Vitals/Pain Pain Assessment ?Pain Assessment: No/denies pain  ? ? ?Home Living Family/patient expects to be discharged to:: Private residence ?Living Arrangements: Non-relatives/Friends ?Available Help at Discharge: Friend(s);Available 24 hours/day ?Type of Home: Apartment ?Home Access: Level entry ?  ?  ?  ?Home Layout: One level ?Home Equipment: Gilmer Mor - single point;Shower seat;Grab bars - tub/shower ?   ?  ?Prior Function Prior Level of Function : Independent/Modified Independent ?  ?  ?  ?  ?  ?  ?  Mobility Comments: uses cane for mobility outside, furniture walking in house ?ADLs Comments: indepednent bathing/dressing but hasn't been able to get into the tub to bathe.  IADLs and driving. ?  ? ? ?Hand Dominance  ?   ? ?  ?Extremity/Trunk Assessment  ? Upper Extremity Assessment ?Upper Extremity Assessment: Overall  WFL for tasks assessed ?  ? ?Lower Extremity Assessment ?Lower Extremity Assessment: Generalized weakness ?  ? ?   ?Communication  ? Communication: No difficulties  ?Cognition Arousal/Alertness: Awake/alert ?Behavior During Therapy: Flat affect ?Overall Cognitive Status: Within Functional Limits for tasks assessed ?  ?  ?  ?  ?  ?  ?  ?  ?  ?  ?  ?  ?  ?  ?  ?  ?General Comments: WFL for simple tasks; not formally assessed ?  ?  ? ?  ?General Comments General comments (skin integrity, edema, etc.): discussed d/c recommendations and DME needs, activity recommendations. Pt denies dizziness, negative for orthostatic hypotension ? ?  ?Exercises    ? ?Assessment/Plan  ?  ?PT Assessment Patient needs continued PT services  ?PT Problem List Decreased strength;Decreased activity tolerance;Decreased balance;Decreased knowledge of use of DME ? ?   ?  ?PT Treatment Interventions DME instruction;Gait training;Functional mobility training;Therapeutic activities;Therapeutic exercise;Balance training   ? ?PT Goals (Current goals can be found in the Care Plan section)  ?Acute Rehab PT Goals ?Patient Stated Goal: home asap ?PT Goal Formulation: With patient ?Time For Goal Achievement: 07/16/21 ?Potential to Achieve Goals: Good ? ?  ?Frequency Min 3X/week ?  ? ? ?Co-evaluation   ?  ?  ?  ?  ? ? ?  ?AM-PAC PT "6 Clicks" Mobility  ?Outcome Measure Help needed turning from your back to your side while in a flat bed without using bedrails?: None ?Help needed moving from lying on your back to sitting on the side of a flat bed without using bedrails?: None ?Help needed moving to and from a bed to a chair (including a wheelchair)?: A Little ?Help needed standing up from a chair using your arms (e.g., wheelchair or bedside chair)?: A Little ?Help needed to walk in hospital room?: A Little ?Help needed climbing 3-5 steps with a railing? : A Little ?6 Click Score: 20 ? ?  ?End of Session Equipment Utilized During Treatment: Gait belt ?Activity  Tolerance: Patient tolerated treatment well;Patient limited by fatigue ?Patient left: in bed;with call bell/phone within reach ?Nurse Communication: Mobility status ?PT Visit Diagnosis: Other abnormalities of gait and mobility (R26.89);Muscle weakness (generalized) (M62.81) ?  ? ?Time: 3086-5784 ?PT Time Calculation (min) (ACUTE ONLY): 11 min ? ? ?Charges:   PT Evaluation ?$PT Eval Moderate Complexity: 1 Mod ?  ?  ?   ?Ina Homes, PT, DPT ?Acute Rehabilitation Services  ?Pager [phone] ?Office [phone] ? ?Malachy Chamber ?07/02/2021, 9:38 AM ? ?

## 2021-07-02 NOTE — Progress Notes (Signed)
Unassigned patient ?Subjective: ?Since I last evaluated the patient, she seems to seems to be doing fairly well.  She denies having any diarrhea after the colonoscopy..  Biopsies pending. ?Objective: ?Vital signs in last 24 hours: ?Temp:  [96.4 ?F (35.8 ?C)-98.3 ?F (36.8 ?C)] 98.3 ?F (36.8 ?C) (03/15 [id number]) ?Pulse Rate:  [65-92] 65 (03/15 0509) ?Resp:  [12-20] 14 (03/15 0509) ?BP: (84-137)/(54-87) 101/67 (03/15 0509) ?SpO2:  [93 %-100 %] 100 % (03/15 0509) ?Last BM Date : 07/01/21 ? ?Intake/Output from previous day: ?03/14 0701 - 03/15 0700 ?In: 398.8 [I.V.:308; IV Piggyback:90.8] ?Out: -  ?Intake/Output this shift: ?No intake/output data recorded. ? ?General appearance: alert, cooperative, appears stated age, and no distress ?Resp: clear to auscultation bilaterally ?Cardio: regular rate and rhythm, S1, S2 normal, no murmur, click, rub or gallop ?GI: soft, non-tender; bowel sounds normal; no masses,  no organomegaly ? ?Lab Results: ?Recent Labs  ?  06/30/21 ?0005 06/30/21 ?[id number] 07/01/21 ?0842  ?WBC 6.0 6.1 5.0  ?HGB 8.6* 7.0* 8.3*  ?HCT 25.3* 21.4* 25.6*  ?PLT 131* 147* 158  ? ?BMET ?Recent Labs  ?  06/30/21 ?[id number] 06/30/21 ?1145 07/01/21 ?0842  ?NA 136 136 135  ?K 2.7* 4.6 3.8  ?CL 100 102 103  ?CO2 28 23 25   ?GLUCOSE 104* 87 84  ?BUN <5* <5* <5*  ?CREATININE 0.51 0.57 0.40*  ?CALCIUM 7.2* 7.3* 7.8*  ? ?LFT ?Recent Labs  ?  06/29/21 ?1720  ?PROT 4.6*  ?ALBUMIN 2.2*  ?AST 34  ?ALT 17  ?ALKPHOS 176*  ?BILITOT 2.7*  ? ?PT/INR ?Recent Labs  ?  06/29/21 ?1558  ?LABPROT 15.1  ?INR 1.2  ? ?Hepatitis Panel ?No results for input(s): HEPBSAG, HCVAB, HEPAIGM, HEPBIGM in the last 72 hours. ?C-Diff ?Recent Labs  ?  06/30/21 ?0844  ?CDIFFTOX NEGATIVE  ? ?No results for input(s): CDIFFPCR in the last 72 hours. ?Fecal Lactopherrin ?No results for input(s): FECLLACTOFRN in the last 72 hours. ? ?Studies/Results: ?No results found. ? ?Medications: I have reviewed the patient's current medications. ?Prior to Admission:  ?Medications  Prior to Admission  ?Medication Sig Dispense Refill Last Dose  ? albuterol (VENTOLIN HFA) 108 (90 Base) MCG/ACT inhaler INHALE 2 PUFFS INTO THE LUNGS EVERY 6 HOURS AS NEEDED FOR WHEEZING OR SHORTNESS OF BREATH (Patient taking differently: Inhale 1 puff into the lungs every 6 (six) hours as needed for shortness of breath.) 25.5 g 3 06/28/2021  ? calcium carbonate (CALCIUM 600) 600 MG TABS tablet Take 1 tablet (600 mg total) by mouth 2 (two) times daily with a meal. 60 tablet 3 06/28/2021  ? diphenhydrAMINE (BENADRYL) 25 MG tablet Take 25 mg by mouth every 6 (six) hours as needed for allergies.   Past Week  ? folic acid (FOLVITE) 1 MG tablet Take 2 tablets (2 mg total) by mouth daily. 60 tablet 5 06/28/2021  ? losartan (COZAAR) 25 MG tablet TAKE 1 TABLET(25 MG) BY MOUTH DAILY (Patient taking differently: Take 25 mg by mouth daily.) 90 tablet 1 06/28/2021  ? oxyCODONE-acetaminophen (PERCOCET/ROXICET) 5-325 MG tablet Take one tablet by mouth every 8-12 hours as needed for pain. (Patient taking differently: Take 1 tablet by mouth every 6 (six) hours as needed for moderate pain.) 90 tablet 0 06/29/2021  ? potassium chloride (KLOR-CON) 20 MEQ tablet Take 1 tablet (20 mEq total) by mouth daily. 3 tablet 0 06/28/2021  ? Tetrahydrozoline HCl (VISINE OP) Place 1 drop into both eyes daily as needed (For dry eyes).   Past Week  ?  trimethoprim-polymyxin b (POLYTRIM) ophthalmic solution Place 1 drop into both eyes QID. (Patient not taking: Reported on 06/29/2021) 10 mL 0 Not Taking  ? ?Scheduled: ? enoxaparin (LOVENOX) injection  40 mg Subcutaneous [id number]  ? folic acid  1 mg Oral Daily  ? multivitamin with minerals  1 tablet Oral Daily  ? ? ?Assessment/Plan: ?Chronic diarrhea with abnormal weight loss-small bowel biopsies from EGD and colonic biopsy results pending. ?2) Chronic folate deficiency deficiency on replacement therapy. ?3) Severe hepatic steatosis with hepatomegaly on CT scan done yesterday. ?4) Hypertension-on losartan at home  which is currently on hold. ?5) Chronic low back pain-multiple changes noted on MRI done on 04/01/2020 ? LOS: 3 days  ? ?Laura [name] ?07/02/2021, 5:25 AM ? ? ?

## 2021-07-02 NOTE — Care Management Important Message (Signed)
Important Message ? ?Patient Details  ?Name: Laura [name] ?MRN: [id number] ?Date of Birth: 1961-04-18 ? ? ?Medicare Important Message Given:  Yes ? ? ? ? ?Joseantonio Dittmar ?07/02/2021, 1:47 PM ?

## 2021-07-02 NOTE — Discharge Summary (Signed)
PatientPhysician Discharge Summary  ?[name] [id number] DOB: Feb 15, 1961 DOA: 06/29/2021 ? ?PCP: Lauree Chandler, NP ? ?Admit date: 06/29/2021 ?Discharge date: 07/02/2021 ?30 Day Unplanned Readmission Risk Score   ? ?Flowsheet Row ED to Hosp-Admission (Current) from 06/29/2021 in [facility] [facility]  ?30 Day Unplanned Readmission Risk Score (%) 14.29 Filed at 07/02/2021 1200  ? ?  ? ? This score is the patient's risk of an unplanned readmission within 30 days of being discharged (0 -100%). The score is based on dignosis, age, lab data, medications, orders, and past utilization.   ?Low:  0-14.9   Medium: 15-21.9   High: 22-29.9   Extreme: 30 and above ? ?  ? ?  ? ? ? ?Admitted From: Home ?Disposition: Home ? ?Recommendations for Outpatient Follow-up:  ?Follow up with PCP in 1-2 weeks ?Please obtain BMP/CBC in one week ?Follow-up with GI for biopsy results. ?Please follow up with your PCP on the following pending results: ?Unresulted Labs (From admission, onward)  ? ?  Start     Ordered  ? 07/06/21 0500  Creatinine, serum  (enoxaparin (LOVENOX)    CrCl >/= 30 ml/min)  Weekly,   R     ?Comments: while on enoxaparin therapy ?  ? 06/29/21 1851  ? 06/30/21 1404  Gliadin antibodies, serum  (Celiac Panel (PNL))  Once,   R       ? 06/30/21 1403  ? 06/30/21 1404  Tissue transglutaminase, IgA  (Celiac Panel (PNL))  Once,   R       ? 06/30/21 1403  ? 06/30/21 1404  Reticulin Antibody, IgA w reflex titer  (Celiac Panel (PNL))  Once,   R       ? 06/30/21 1403  ? Pending  MRSA Next Gen by PCR, Nasal  Once,   R       ? Pending  ? ?  ?  ? ?  ?  ? ? ?Home Health: Yes ?Equipment/Devices: None ? ?Discharge Condition: Stable ?CODE STATUS: Full code ?Diet recommendation: Cardiac ? ?Subjective: Seen and examined.  No complaints.  Ready to go home. ? ?Brief/Interim Summary: Irania Naeve is a 61 y.o. adult female who identifies as female with medical history significant of HTN who was in his usual state of reasonable health  until mid November 2022 when he developed diarrhea.  Patient is unable to provide a specific history as he does not keep track of dates.  He states he initially developed nausea and vomiting "I had to keep a bucket beside my bed" in mid November but then subsequently progressed to having watery diarrhea.  He states that the vomiting subsided once the diarrhea started.  Notes that the diarrhea waxes and wanes but has been present over the past 3 to 4 months.   He thinks he has lost weight but is not sure how much.  Patient notes he gets up multiple times at night with diarrhea.  On bad days he has diarrhea 5-7 times a day, on better days he has diarrhea 2-3 times a day.  Patient was seen by his PCP last month and was treated with Flagyl with no effect. ?  ?Patient has been feeling weak and tired with difficulty ambulating for the past 2 to 3 weeks.  2 days ago he had an episode of syncope, did not hit his head, does not know how long he was out but there was family around and he was not confused when he came to.  Today he noted he was unable to walk back and forth to the bathroom without being severely exhausted. ?  ?Patient has not been started on any new medications, denies any travel out of the country and states that he only drinks bottled water.  Patient denies alcohol use. ?  ?Patient denies any blood in the stool.  Notes that it is watery, yellow or brown.  Did have 1 episode of bright red blood per rectum on the toilet paper but never in the toilet.  He attributed this to raw anal skin. ?  ?Patient's initial blood pressures were 80s over 40s with initial hemoglobin of 7.8 which decreased to 5.8 with hydration.  Stool guaiac was negative, electrolyte abnormalities including hypokalemia, hypomagnesemia and hypocalcemia.  He was treated with IV fluid resuscitation, aggressive electrolyte repletion and initiation of transfusion.  Admitted to hospitalist service.  Further details as below. ?  ?Hypotension/lactic  acidosis/dehydration: Likely secondary to chronic diarrhea with decreased p.o. intake.  He was initially started on antibiotics however antibiotic discontinued, no sign of infection.  Patient appears to be better hydrated today.  Lactic acid also improving from 4 yesterday to 2 yesterday.  I ordered repeat lactic acid but patient refused blood draw.  Patient's antihypertensives have been on hold since admission and his blood pressure remains on the low normal side so based off of that, I am discontinuing his antihypertensives.  ?  ?Subacute diarrhea for 3 to 4 months: GI board.  C. difficile and GI pathogen panel negative.  Patient underwent both EGD as well as colonoscopy by GI on 07/01/2021 which was unremarkable but biopsies have been taken and results are still pending.  Patient is doing much better with diarrhea improved and he feels stronger, seen by PT OT recommends home health and thus he is going to be discharged home with home health. ?  ?Macrocytic anemia/folate deficiency: Presented with hemoglobin of 5.8.  Received 2 units of PRBC.  Hemoglobin improved to 8.3.  FOBT x2 negative.  Continue folate supplement at home. ?  ?Chronic thrombocytopenia: Stable. ?  ?Hypokalemia: Resolved. ?  ?Hypomagnesemia: 1.6 again today.  IV magnesium sulfate was ordered however his IV occluded and he does not want another IV for that.  He will get oral magnesium oxide before discharge. ?  ?Hypocalcemia: Improving.  Resume taking calcium at home. ?  ?HTN: of note patient has continued to take losartan throughout her course of illness.  Currently on hold.  Blood pressure controlled.  Discontinuing losartan. ?  ?Chronic lower back pain: ?Continue as needed Percocet per home doses. ? ?Discharge plan was discussed with patient and/or family member and they verbalized understanding and agreed with it.  ?Discharge Diagnoses:  ?Principal Problem: ?  Lactic acidosis ?Active Problems: ?  Obesity (BMI 30.0-34.9) ?  Macrocytic anemia ?   Hypotension ?  Hypokalemia ?  Hypomagnesemia ? ? ? ?Discharge Instructions ? ? ?Allergies as of 07/02/2021   ? ?   Reactions  ? Aspirin Hives  ? Lyrica [pregabalin]   ? Nightmares, did not help neuropathy  ? Penicillins Hives  ? ?  ? ?  ?Medication List  ?  ? ?STOP taking these medications   ? ?losartan 25 MG tablet ?Commonly known as: COZAAR ?  ?trimethoprim-polymyxin b ophthalmic solution ?Commonly known as: POLYTRIM ?  ? ?  ? ?TAKE these medications   ? ?albuterol 108 (90 Base) MCG/ACT inhaler ?Commonly known as: VENTOLIN HFA ?INHALE 2 PUFFS INTO THE LUNGS EVERY 6 HOURS AS  NEEDED FOR WHEEZING OR SHORTNESS OF BREATH ?What changed:  ?how much to take ?reasons to take this ?  ?calcium carbonate 600 MG Tabs tablet ?Commonly known as: Calcium 600 ?Take 1 tablet (600 mg total) by mouth 2 (two) times daily with a meal. ?  ?diphenhydrAMINE 25 MG tablet ?Commonly known as: BENADRYL ?Take 25 mg by mouth every 6 (six) hours as needed for allergies. ?  ?folic acid 1 MG tablet ?Commonly known as: FOLVITE ?Take 2 tablets (2 mg total) by mouth daily. ?  ?oxyCODONE-acetaminophen 5-325 MG tablet ?Commonly known as: PERCOCET/ROXICET ?Take one tablet by mouth every 8-12 hours as needed for pain. ?What changed:  ?how much to take ?how to take this ?when to take this ?reasons to take this ?additional instructions ?  ?potassium chloride SA 20 MEQ tablet ?Commonly known as: KLOR-CON M ?Take 1 tablet (20 mEq total) by mouth daily. ?  ?VISINE OP ?Place 1 drop into both eyes daily as needed (For dry eyes). ?  ? ?  ? ?  ?  ? ? ?  ?Durable Medical Equipment  ?(From admission, onward)  ?  ? ? ?  ? ?  Start     Ordered  ? 07/02/21 1131  For home use only DME Walker rolling  Once       ?Question Answer Comment  ?Walker: With 5 Inch Wheels   ?Patient needs a walker to treat with the following condition Weakness   ?  ? 07/02/21 1131  ? 07/02/21 1131  For home use only DME Other see comment  Once       ?Comments: Transfer tub bench  ?Question:   Length of Need  Answer:  Lifetime  ? 07/02/21 1131  ? ?  ?  ? ?  ? ? Follow-up Information   ? ? Lauree Chandler, NP. Schedule an appointment as soon as possible for a visit in 1 week(s).   ?Specialty:

## 2021-07-02 NOTE — Evaluation (Addendum)
Occupational Therapy Evaluation ?Patient Details ?Name: Laura [name] ?MRN: [id number] ?DOB: 08/22/1960 ?Today's Date: 07/02/2021 ? ? ?History of Present Illness Pt is a 61 y/o presenting on 06/29/21 with diarrhea for 3-4 months with resultant hypotension, anemia. PMH includes bronchitis, HPV, insomina, chronic back pain with prior sx.  ? ?Clinical Impression ?  ?PTA patient independent using cane for mobility as needed, Adls/IADLs.  Admitted for above and presents with problem list below, including decreased activity tolerance, weakness and mild instability.  Patient currently requires up to min guard for ADLs, transfers and mobility in room.  Pt reports feeling weak and feels more comfortable with RW vs cane.  VSS on RA during session. Believe pt will benefit from further OT acutely and after dc at [facility] level to optimize independence and safety with ADLs, IADLs and mobility.  ?   ? ?Recommendations for follow up therapy are one component of a multi-disciplinary discharge planning process, led by the attending physician.  Recommendations may be updated based on patient status, additional functional criteria and insurance authorization.  ? ?Follow Up Recommendations ? Home health OT  ?  ?Assistance Recommended at Discharge Intermittent Supervision/Assistance  ?Patient can return home with the following A little help with walking and/or transfers;A little help with bathing/dressing/bathroom;Assistance with cooking/housework;Assist for transportation;Help with stairs or ramp for entrance ? ?  ?Functional Status Assessment ? Patient has had a recent decline in their functional status and demonstrates the ability to make significant improvements in function in a reasonable and predictable amount of time.  ?Equipment Recommendations ? Other (comment);Tub/shower bench (RW)  ?  ?Recommendations for Other Services   ? ? ?  ?Precautions / Restrictions Precautions ?Precautions: Fall ?Restrictions ?Weight Bearing Restrictions: No   ? ?  ? ?Mobility Bed Mobility ?Overal bed mobility: Modified Independent ?  ?  ?  ?  ?  ?  ?  ?  ? ?Transfers ?  ?  ?  ?  ?  ?  ?  ?  ?  ?  ?  ? ?  ?Balance Overall balance assessment: Needs assistance ?  ?Sitting balance-Leahy Scale: Good ?  ?  ?  ?Standing balance-Leahy Scale: Fair ?Standing balance comment: statically during ADLs with close supervision and 0 hand support, preference for RW dynamically ?  ?  ?  ?  ?  ?  ?  ?  ?  ?  ?  ?   ? ?ADL either performed or assessed with clinical judgement  ? ?ADL Overall ADL's : Needs assistance/impaired ?  ?  ?Grooming: Min guard;Standing ?  ?  ?  ?  ?  ?Upper Body Dressing : Set up;Sitting ?  ?Lower Body Dressing: Min guard;Sit to/from stand ?  ?Toilet Transfer: Min guard;Ambulation;Rolling walker (2 wheels) ?  ?  ?  ?  ?Tub/Shower Transfer Details (indicate cue type and reason): simulated, difficulty clearing LEs over "tub height". ?Functional mobility during ADLs: Min guard;Supervision/safety;Rolling walker (2 wheels) ?General ADL Comments: limited by weakness and decreased activity tolerance  ? ? ? ?Vision Baseline Vision/History: 1 Wears glasses ?Ability to See in Adequate Light: 0 Adequate ?Patient Visual Report: No change from baseline ?Vision Assessment?: No apparent visual deficits  ?   ?Perception   ?  ?Praxis   ?  ? ?Pertinent Vitals/Pain Pain Assessment ?Pain Assessment: No/denies pain  ? ? ? ?Hand Dominance   ?  ?Extremity/Trunk Assessment Upper Extremity Assessment ?Upper Extremity Assessment: Generalized weakness ?  ?Lower Extremity Assessment ?Lower Extremity Assessment: Defer to PT  evaluation ?  ?  ?  ?Communication Communication ?Communication: No difficulties ?  ?Cognition Arousal/Alertness: Awake/alert ?Behavior During Therapy: Flat affect ?Overall Cognitive Status: Within Functional Limits for tasks assessed ?  ?  ?  ?  ?  ?  ?  ?  ?  ?  ?  ?  ?  ?  ?  ?  ?General Comments: WFL for simple tasks; not formally assessed ?  ?  ?General Comments  BP  assessed, negative for orthostatics.  Educated on activity progression and recommendations ? ?  ?Exercises   ?  ?Shoulder Instructions    ? ? ?Home Living Family/patient expects to be discharged to:: Private residence ?Living Arrangements: Non-relatives/Friends ?Available Help at Discharge: Friend(s);Available 24 hours/day ?Type of Home: Apartment ?Home Access: Level entry ?  ?  ?Home Layout: One level ?  ?  ?Bathroom Shower/Tub: Tub/shower unit ?  ?Bathroom Toilet: Standard ?  ?  ?Home Equipment: Gilmer Mor - single point;Shower seat;Grab bars - tub/shower ?  ?  ?  ? ?  ?Prior Functioning/Environment Prior Level of Function : Independent/Modified Independent ?  ?  ?  ?  ?  ?  ?Mobility Comments: uses cane for mobility outside, furniture walking in house ?ADLs Comments: independent bathing/dressing but hasn't been able to get into the tub to bathe.  IADLs and driving. ?  ? ?  ?  ?OT Problem List: Decreased strength;Decreased activity tolerance;Impaired balance (sitting and/or standing);Decreased safety awareness;Decreased knowledge of use of DME or AE;Decreased knowledge of precautions ?  ?   ?OT Treatment/Interventions: Self-care/ADL training;Therapeutic exercise;DME and/or AE instruction;Therapeutic activities;Patient/family education;Balance training  ?  ?OT Goals(Current goals can be found in the care plan section) Acute Rehab OT Goals ?Patient Stated Goal: home ?OT Goal Formulation: With patient ?Time For Goal Achievement: 07/16/21 ?Potential to Achieve Goals: Good ?ADL Goals ?Pt Will Perform Grooming: Independently;standing ?Pt Will Perform Lower Body Dressing: with modified independence;sit to/from stand ?Pt Will Transfer to Toilet: with modified independence;ambulating ?Pt Will Perform Tub/Shower Transfer: Tub transfer;with modified independence;tub bench;ambulating;rolling walker ?Pt/caregiver will Perform Home Exercise Program: Increased strength;Both right and left upper extremity;With written HEP provided;With  theraband  ?OT Frequency: Min 2X/week ?  ? ?Co-evaluation   ?  ?  ?  ?  ? ?  ?AM-PAC OT "6 Clicks" Daily Activity     ?Outcome Measure Help from another person eating meals?: None ?Help from another person taking care of personal grooming?: A Little ?Help from another person toileting, which includes using toliet, bedpan, or urinal?: A Little ?Help from another person bathing (including washing, rinsing, drying)?: A Little ?Help from another person to put on and taking off regular upper body clothing?: A Little ?Help from another person to put on and taking off regular lower body clothing?: A Little ?6 Click Score: 19 ?  ?End of Session Equipment Utilized During Treatment: Gait belt;Rolling walker (2 wheels) ?Nurse Communication: Mobility status ? ?Activity Tolerance: Patient tolerated treatment well ?Patient left: in bed;with call bell/phone within reach ? ?OT Visit Diagnosis: Other abnormalities of gait and mobility (R26.89);Muscle weakness (generalized) (M62.81)  ?              ?Time: 2585-2778 ?OT Time Calculation (min): 11 min ?Charges:  OT General Charges ?$OT Visit: 1 Visit ?OT Evaluation ?$OT Eval Moderate Complexity: 1 Mod ? ?Barry Brunner, OT ?Acute Rehabilitation Services ?Pager [phone] ?Office [phone] ? ? ?Chancy Milroy ?07/02/2021, 9:55 AM ?

## 2021-07-02 NOTE — TOC Transition Note (Signed)
? ? ?  Address, phone number and PCP confirmed. Transition of Care (TOC) - CM/SW Discharge Note ? ? ?Patient Details  ?Name: Laura [name] ?MRN: [id number] ?Date of Birth: [birth year] ? ?Transition of Care (TOC) CM/SW Contact:  ?Harriet Masson, RN ?Phone Number: ?07/02/2021, 12:53 PM ? ? ?Clinical Narrative:    ? ?Orders for Home Health and DME. ?Spoke to patient regarding transition needs. ?Choice offered an patient defers to [facility] to find [facility] agency.  ?Cory with [facility] accepted referral for HH-PT/OT ?Patient is agreeable to use in house provider, [facility], for DME needs. ?Spoke to [place] with [facility] and walker and transfer bench ordered. ?Address, Phone number and PCP verified.  ? ? ?Final next level of care: Home w Home Health Services ?Barriers to Discharge: Barriers Resolved ? ? ?Patient Goals and CMS Choice ?Patient states their goals for this hospitalization and ongoing recovery are:: go home ?CMS Medicare.gov Compare Post Acute Care list provided to:: Patient ?Choice offered to / list presented to : Patient ? ?Discharge Placement ?  ?           ? home ?  ?  ?  ? ?Discharge Plan and Services ?  ?Discharge Planning Services: CM Consult ?Post Acute Care Choice: Home Health, Durable Medical Equipment          ?DME Arranged: Tub bench, Walker rolling ?DME Agency: [facility] ?Date DME Agency Contacted: 07/02/21 ?Time DME Agency Contacted: 1252 ?Representative spoke with at DME Agency: Velna Hatchet ?HH Arranged: PT, OT ?HH Agency: [facility] ?Date HH Agency Contacted: 07/02/21 ?Time HH Agency Contacted: 1252 ?Representative spoke with at [facility]: cory ? ?Social Determinants of Health (SDOH) Interventions ?  ? ? ?Readmission Risk Interventions ?Readmission Risk Prevention Plan 07/02/2021  ?Post Dischage Appt Complete  ?Medication Screening Complete  ?Transportation Screening Complete  ?Some recent data might be hidden  ? ? ? ? ? ?

## 2021-07-02 NOTE — Progress Notes (Signed)
?   07/02/21 0338  ?Assess: MEWS Score  ?Temp 98.3 ?F (36.8 ?C)  ?BP (!) 93/56  ?Pulse Rate 66  ?ECG Heart Rate 67  ?Resp 13  ?Level of Consciousness Alert  ?SpO2 100 %  ?O2 Device Nasal Cannula  ?Patient Activity (if Appropriate) In bed  ?O2 Flow Rate (L/min) 2 L/min  ? ? ?

## 2021-07-03 ENCOUNTER — Telehealth: Payer: Self-pay | Admitting: *Deleted

## 2021-07-03 LAB — RETICULIN ANTIBODIES, IGA W TITER: Reticulin Ab, IgA: NEGATIVE titer (ref <2.5)

## 2021-07-03 LAB — SURGICAL PATHOLOGY

## 2021-07-03 NOTE — Telephone Encounter (Signed)
Transition Care Management Follow-up Telephone Call ?Date of discharge and from where: 07/02/2021 [place] ?How have you been since you were released from the hospital? weak ?Any questions or concerns? No ? ?Items Reviewed: ?Did the pt receive and understand the discharge instructions provided? Yes  ?Medications obtained and verified? Yes  ?Other? No  ?Any new allergies since your discharge? No  ?Dietary orders reviewed? Yes ?Do you have support at home? Yes  ? ?Home Care and Equipment/Supplies: ?Were home health services ordered? no ?If so, what is the name of the agency? na  ?Has the agency set up a time to come to the patient's home? not applicable ?Were any new equipment or medical supplies ordered?  No ?What is the name of the medical supply agency? na ?Were you able to get the supplies/equipment? not applicable ?Do you have any questions related to the use of the equipment or supplies? No ? ?Functional Questionnaire: (I = Independent and D = Dependent) ?ADLs: I with assistance ? ?Bathing/Dressing- I ? ?Meal Prep- I ? ?Eating- I ? ?Maintaining continence- I ? ?Transferring/Ambulation- I with assistance ? ?Managing Meds- I ? ?Follow up appointments reviewed: ? ?PCP Hospital f/u appt confirmed? Yes  Scheduled to see Dinah on 07/09/2021 @ 10 refused in office appointment. ?Specialist Hospital f/u appt confirmed? No   ?Are transportation arrangements needed? No  ?If their condition worsens, is the pt aware to call PCP or go to the Emergency Dept.? Yes ?Was the patient provided with contact information for the PCP's office or ED? Yes ?Was to pt encouraged to call back with questions or concerns? Yes  ?

## 2021-07-04 ENCOUNTER — Encounter: Payer: Medicare Other | Admitting: Nurse Practitioner

## 2021-07-04 LAB — CULTURE, BLOOD (SINGLE): Culture: NO GROWTH

## 2021-07-07 ENCOUNTER — Encounter: Payer: Self-pay | Admitting: Nurse Practitioner

## 2021-07-07 ENCOUNTER — Telehealth (INDEPENDENT_AMBULATORY_CARE_PROVIDER_SITE_OTHER): Payer: Medicare Other | Admitting: Nurse Practitioner

## 2021-07-07 ENCOUNTER — Other Ambulatory Visit: Payer: Self-pay

## 2021-07-07 DIAGNOSIS — E876 Hypokalemia: Secondary | ICD-10-CM

## 2021-07-07 DIAGNOSIS — E538 Deficiency of other specified B group vitamins: Secondary | ICD-10-CM

## 2021-07-07 DIAGNOSIS — I10 Essential (primary) hypertension: Secondary | ICD-10-CM | POA: Diagnosis not present

## 2021-07-07 DIAGNOSIS — D519 Vitamin B12 deficiency anemia, unspecified: Secondary | ICD-10-CM | POA: Diagnosis not present

## 2021-07-07 DIAGNOSIS — R601 Generalized edema: Secondary | ICD-10-CM

## 2021-07-07 DIAGNOSIS — R197 Diarrhea, unspecified: Secondary | ICD-10-CM | POA: Diagnosis not present

## 2021-07-07 MED ORDER — PREDNISONE 10 MG (21) PO TBPK: ORAL_TABLET | ORAL | 0 refills | Status: DC

## 2021-07-07 NOTE — Progress Notes (Signed)
err

## 2021-07-07 NOTE — Progress Notes (Signed)
? ? ?Careteam: ?Patient Care Team: ?Laura Chandler, NP as PCP - General (Geriatric Medicine) ? ?Advanced Directive information ?Does Patient Have a Medical Advance Directive?: No, Would patient like information on creating a medical advance directive?: No - Patient declined ? ?Allergies  ?Allergen Reactions  ? Aspirin Hives  ? Lyrica [Pregabalin]   ?  Nightmares, did not help neuropathy  ? Penicillins Hives  ? ? ?Chief Complaint  ?Patient presents with  ? Transitions Of Care  ?  Follow-up from recent hospital stay 06/29/21-07/02/21 via telephone/video visit.   ? ? ? ?HPI: Patient is a 61 y.o. adult for hospital follow up.  ?Laura [name] was hospitalized from 3/12-3/15 due to AKI, dehydration, anemia.  ? ?He reports his left hand started swelling then feet and legs started to swell- this happened after hospitalization. Face also swelling. All started after hospitalized and not taking any new medication.  ?He reports he has sores on body from the EKG leads.  ?No shortness of breath.  ?Legs and feet still swollen, no worse.  ?Minimal oral intake.  ?Using protein shakes to supplement ? ?He was extremely weak, when he left hospital he could not get on and off commode, getting better at this time.  ?Has not seen a physical therapist, reports hospital told him that they would be sending  one.  ?Laura [name] was consulted but has not heard from them.  ? ?Diarrhea has not stopped. Had colonoscopy and endoscopy during hospitalization.  ? ?Severe anemia noted- was transfused 2 units PRBC ? ?Blood pressure was on low side in hospital so losartan was stopped, reports blood pressure cuff has broken so has not taken BP at home.  ? ? ?Review of Systems:  ?Review of Systems  ?Constitutional:  Positive for malaise/fatigue. Negative for chills, fever and weight loss.  ?HENT:  Negative for tinnitus.   ?Respiratory:  Negative for cough, sputum production and shortness of breath.   ?Cardiovascular:  Negative for chest pain, palpitations and leg  swelling.  ?Gastrointestinal:  Positive for diarrhea. Negative for abdominal pain, constipation, heartburn, nausea and vomiting.  ?Genitourinary:  Negative for dysuria, frequency and urgency.  ?Musculoskeletal:  Positive for back pain. Negative for falls, joint pain and myalgias.  ?Skin: Negative.   ?Neurological:  Positive for weakness. Negative for dizziness and headaches.  ?Psychiatric/Behavioral:  Negative for depression and memory loss. The patient does not have insomnia.   ? ?Past Medical History:  ?Diagnosis Date  ? Acute bronchitis   ? Cervical high risk HPV (human papillomavirus) test positive 12/2017  ? Negative subtype 16, 18/45  ? Chronic back pain   ? Complication of anesthesia   ? "didn't wake up"  ? Cough   ? Insomnia, unspecified   ? Other malaise and fatigue   ? Other specified diseases of blood and blood-forming organs(289.89)   ? Tobacco use disorder   ? ?Past Surgical History:  ?Procedure Laterality Date  ? BIOPSY  07/01/2021  ? Procedure: BIOPSY;  Surgeon: Carol Ada, MD;  Location: [place];  Service: Gastroenterology;;  EGD and COLON  ? COLONOSCOPY WITH PROPOFOL N/A 07/01/2021  ? Procedure: COLONOSCOPY WITH PROPOFOL;  Surgeon: Carol Ada, MD;  Location: [place];  Service: Gastroenterology;  Laterality: N/A;  ? ESOPHAGOGASTRODUODENOSCOPY N/A 03/17/2013  ? Procedure: ESOPHAGOGASTRODUODENOSCOPY (EGD);  Surgeon: Ladene Artist, MD;  Location: [facility] ENDOSCOPY;  Service: Endoscopy;  Laterality: N/A;  ? ESOPHAGOGASTRODUODENOSCOPY (EGD) WITH PROPOFOL N/A 07/01/2021  ? Procedure: ESOPHAGOGASTRODUODENOSCOPY (EGD) WITH PROPOFOL;  Surgeon: Carol Ada, MD;  Location: [facility] ENDOSCOPY;  Service: Gastroenterology;  Laterality: N/A;  ? FRACTURE SURGERY    ? Laminectomy and foraminotomy at L5-S1    12/12/2019  ? TONSILLECTOMY    ? ?Social History: ?  reports that he has been smoking cigarettes. He has a 6.00 pack-year smoking history. He has never used smokeless tobacco. He reports that he does not  currently use alcohol. He reports that he does not use drugs. ? ?Family History  ?Problem Relation Age of Onset  ? Arthritis Mother   ? Hypertension Sister   ? Hypertension Brother   ? Hypertension Sister   ? Hypertension Sister   ? Hypertension Sister   ? ? ?Medications: ?Patient's Medications  ?New Prescriptions  ? No medications on file  ?Previous Medications  ? ALBUTEROL (VENTOLIN HFA) 108 (90 BASE) MCG/ACT INHALER    INHALE 2 PUFFS INTO THE LUNGS EVERY 6 HOURS AS NEEDED FOR WHEEZING OR SHORTNESS OF BREATH  ? CALCIUM CARBONATE (CALCIUM 600) 600 MG TABS TABLET    Take 1 tablet (600 mg total) by mouth 2 (two) times daily with a meal.  ? DIPHENHYDRAMINE (BENADRYL) 25 MG TABLET    Take 25 mg by mouth every 6 (six) hours as needed for allergies.  ? FOLIC ACID (FOLVITE) 1 MG TABLET    Take 2 tablets (2 mg total) by mouth daily.  ? OXYCODONE-ACETAMINOPHEN (PERCOCET/ROXICET) 5-325 MG TABLET    Take one tablet by mouth every 8-12 hours as needed for pain.  ? POTASSIUM CHLORIDE (KLOR-CON) 20 MEQ TABLET    Take 1 tablet (20 mEq total) by mouth daily.  ? TETRAHYDROZOLINE HCL (VISINE OP)    Place 1 drop into both eyes daily as needed (For dry eyes).  ?Modified Medications  ? No medications on file  ?Discontinued Medications  ? No medications on file  ? ? ?Physical Exam: ? ?There were no vitals filed for this visit. ?There is no height or weight on file to calculate BMI. ?Wt Readings from Last 3 Encounters:  ?06/29/21 189 lb 9.5 oz (86 kg)  ?01/03/21 190 lb (86.2 kg)  ?02/07/20 205 lb (93 kg)  ? ? ?Physical Exam ?Constitutional:   ?   Appearance: Normal appearance.  ?HENT:  ?   Head:  ?   Comments: Swelling noted via video visit to lips, cheeks and around eyes.  ?Able to speak without difficulty  ?Neurological:  ?   Mental Status: He is alert. Mental status is at baseline.  ?Psychiatric:     ?   Mood and Affect: Mood normal.  ? ? ?Labs reviewed: ?Basic Metabolic Panel: ?Recent Labs  ?  06/30/21 ?0005 06/30/21 ?1610  06/30/21 ?0752 06/30/21 ?1145 07/01/21 ?9604 07/02/21 ?1003  ?NA 137 136  --  136 135 137  ?K 2.8* 2.7*  --  4.6 3.8 3.9  ?CL 98 100  --  102 103 104  ?CO2 28 28  --  _0 ?GLUCOSE 100* 104*  --  87 84 84  ?BUN <5* <5*  --  <5* <5* <5*  ?CREATININE 0.60 0.51  --  0.57 0.40* 0.39*  ?CALCIUM 7.2* 7.2*  --  7.3* 7.8* 8.0*  ?MG 1.3*  --  2.2  --   --  1.6*  ?PHOS 2.4*  --   --   --   --   --   ?TSH  --  2.961  --   --   --   --   ? ?Liver Function Tests: ?Recent  Labs  ?  01/03/21 ?1151 06/29/21 ?1720  ?AST 58* 34  ?ALT 19 17  ?ALKPHOS  --  176*  ?BILITOT 1.1 2.7*  ?PROT 6.2 4.6*  ?ALBUMIN  --  2.2*  ? ?No results for input(s): LIPASE, AMYLASE in the last 8760 hours. ?No results for input(s): AMMONIA in the last 8760 hours. ?CBC: ?Recent Labs  ?  01/03/21 ?1151 01/13/21 ?1014 06/29/21 ?1611 06/30/21 ?9038 07/01/21 ?3338 07/02/21 ?1003  ?WBC 4.0 2.7*   < > 6.1 5.0 6.7  ?NEUTROABS 2,664 1,461*  --   --   --  5.6  ?HGB 9.9* 10.4*   < > 7.0* 8.3* 8.4*  ?HCT 28.2* 30.5*   < > 21.4* 25.6* 26.0*  ?MCV 118.5* 116.9*   < > 102.9* 105.3* 106.1*  ?PLT 84* 316   < > 147* 158 160  ? < > = values in this interval not displayed.  ? ?Lipid Panel: ?Recent Labs  ?  01/03/21 ?1151  ?CHOL 140  ?HDL 80  ?[place] 45  ?TRIG 70  ?CHOLHDL 1.8  ? ?TSH: ?Recent Labs  ?  06/30/21 ?3291  ?TSH 2.961  ? ?A1C: ?Lab Results  ?Component Value Date  ? HGBA1C 4.8 07/01/2015  ? ? ? ?Assessment/Plan ?1. Diarrhea, unspecified type ?-ongoing, using antidiarrheal for relief. ?- Ambulatory referral to Gastroenterology for further evaluation and treatment.  ? ?2. Generalized edema ?Pt reports swelling to face, hands, legs and feet since hospitalization. There has been no progression of this but slow improvement. No trouble breathing at this time but increase in difficulty swallowing.  ?STRICT precautions to return to hospital if edema worsens, tongue, mouth or breathing effected ?- predniSONE (STERAPRED UNI-PAK 21 TAB) 10 MG (21) TBPK tablet; Use as directed   Dispense: 21 tablet; Refill: 0 ? ?3. Hypocalcemia ?-will follow up lab ?- CMP with eGFR([facility]); Future ? ?4. Hypokalemia ?-to follow up lab ?- CMP with eGFR([facility]); Future ? ?5. Folic acid deficiency ?-continue

## 2021-07-07 NOTE — Progress Notes (Signed)
? ?  This service is provided via telemedicine ? ?No vital signs collected/recorded due to the encounter was a telemedicine visit.  ? ?Location of patient (ex: home, work):  Home ? ?Patient consents to a telephone visit: Yes, see telephone visit dated 10/17/2020 ? ?Location of the provider (ex: office, home):  [facility], Office  ? ?Name of any referring provider:  N/A ? ?Names of all persons participating in the telemedicine service and their role in the encounter:  [name], Abbey Chatters, NP, and Patient  ? ?Time spent on call:  7 min with medical assistant  ? ?

## 2021-07-09 ENCOUNTER — Telehealth: Payer: Medicare Other | Admitting: Family

## 2021-07-10 ENCOUNTER — Ambulatory Visit (INDEPENDENT_AMBULATORY_CARE_PROVIDER_SITE_OTHER): Payer: Medicare Other | Admitting: Family

## 2021-07-10 ENCOUNTER — Other Ambulatory Visit: Payer: Self-pay

## 2021-07-10 ENCOUNTER — Other Ambulatory Visit: Payer: Medicare Other

## 2021-07-10 ENCOUNTER — Encounter: Payer: Self-pay | Admitting: Family

## 2021-07-10 VITALS — BP 100/68 | HR 73 | Temp 97.2°F | Resp 16 | Ht 65.0 in

## 2021-07-10 DIAGNOSIS — E876 Hypokalemia: Secondary | ICD-10-CM

## 2021-07-10 DIAGNOSIS — R531 Weakness: Secondary | ICD-10-CM | POA: Diagnosis not present

## 2021-07-10 DIAGNOSIS — E538 Deficiency of other specified B group vitamins: Secondary | ICD-10-CM

## 2021-07-10 DIAGNOSIS — D519 Vitamin B12 deficiency anemia, unspecified: Secondary | ICD-10-CM

## 2021-07-10 DIAGNOSIS — R197 Diarrhea, unspecified: Secondary | ICD-10-CM | POA: Diagnosis not present

## 2021-07-10 NOTE — Patient Instructions (Signed)
Please go to ED for evaluation of diarrhea and Generalized weakness. ?

## 2021-07-10 NOTE — Progress Notes (Signed)
? ?Provider: Marlowe Sax FNP-C ? ?Lauree Chandler, NP ? ?Patient Care Team: ?Lauree Chandler, NP as PCP - General (Geriatric Medicine) ? ?Extended Emergency Contact Information ?Primary Emergency Contact: Riddle,[place] ? [place] ?Home Phone: [phone] ?Mobile Phone: [phone] ?Relation: Friend ?Secondary Emergency Contact: HARRIS,NETTIE ?Home Phone: [phone] ?Relation: Relative ? ?Code Status:  Full Code  ?Goals of care: Advanced Directive information ? ?  07/10/2021  ?  2:18 PM  ?Advanced Directives  ?Does Patient Have a Medical Advance Directive? No  ?Would patient like information on creating a medical advance directive? No - Patient declined  ? ? ? ?Chief Complaint  ?Patient presents with  ? Follow-up  ?  Follow up on blood pressure/ CMP/CBC check.  ? ? ?HPI:  ?Pt is a 61 y.o. adult seen today for an acute visit for follow up blood pressure.  Had video visit with PCP [name], NP was advised to follow-up for blood work and to recheck blood pressure since his losartan was stopped during his recent hospitalization.  He has blood pressure cuff was broken so he had low blood pressure cuff checked at home ? ?Has had diarrhea x 4 times.states feels more weaker than after hospital discharge.  States had colonoscopy and endoscopy during hospitalization.  Has not seen any blood or dark stool.  He had severe anemia during hospitalization was given 2 units of packed red blood cells. ?Has not had anything to eat since yesterday.  Blood work drawn today for CBC and CMP but patient passed out when blood was being drawn. ?He is here with a friend [place]. ? ?Past Medical History:  ?Diagnosis Date  ? Acute bronchitis   ? Cervical high risk HPV (human papillomavirus) test positive 12/2017  ? Negative subtype 16, 18/45  ? Chronic back pain   ? Complication of anesthesia   ? "didn't wake up"  ? Cough   ? Insomnia, unspecified   ? Other malaise and fatigue   ? Other specified diseases of blood  and blood-forming organs(289.89)   ? Tobacco use disorder   ? ?Past Surgical History:  ?Procedure Laterality Date  ? BIOPSY  07/01/2021  ? Procedure: BIOPSY;  Surgeon: Carol Ada, MD;  Location: [place];  Service: Gastroenterology;;  EGD and COLON  ? COLONOSCOPY WITH PROPOFOL N/A 07/01/2021  ? Procedure: COLONOSCOPY WITH PROPOFOL;  Surgeon: Carol Ada, MD;  Location: [place];  Service: Gastroenterology;  Laterality: N/A;  ? ESOPHAGOGASTRODUODENOSCOPY N/A 03/17/2013  ? Procedure: ESOPHAGOGASTRODUODENOSCOPY (EGD);  Surgeon: Ladene Artist, MD;  Location[facility] ENDOSCOPY;  Service: Endoscopy;  Laterality: N/A;  ? ESOPHAGOGASTRODUODENOSCOPY (EGD) WITH PROPOFOL N/A 07/01/2021  ? Procedure: ESOPHAGOGASTRODUODENOSCOPY (EGD) WITH PROPOFOL;  Surgeon: Carol Ada, MD;  Location: [place];  Service: Gastroenterology;  Laterality: N/A;  ? FRACTURE SURGERY    ? Laminectomy and foraminotomy at L5-S1    12/12/2019  ? TONSILLECTOMY    ? ? ?Allergies  ?Allergen Reactions  ? Aspirin Hives  ? Lyrica [Pregabalin]   ?  Nightmares, did not help neuropathy  ? Penicillins Hives  ? ? ?Outpatient Encounter Medications as of 07/10/2021  ?Medication Sig  ? albuterol (VENTOLIN HFA) 108 (90 Base) MCG/ACT inhaler INHALE 2 PUFFS INTO THE LUNGS EVERY 6 HOURS AS NEEDED FOR WHEEZING OR SHORTNESS OF BREATH  ? calcium carbonate (CALCIUM 600) 600 MG TABS tablet Take 1 tablet (600 mg total) by mouth 2 (two) times daily with a meal.  ? diphenhydrAMINE (BENADRYL) 25 MG tablet Take 25 mg by  mouth every 6 (six) hours as needed for allergies.  ? folic acid (FOLVITE) 1 MG tablet Take 2 tablets (2 mg total) by mouth daily.  ? oxyCODONE-acetaminophen (PERCOCET/ROXICET) 5-325 MG tablet Take one tablet by mouth every 8-12 hours as needed for pain.  ? potassium chloride (KLOR-CON) 20 MEQ tablet Take 1 tablet (20 mEq total) by mouth daily.  ? predniSONE (STERAPRED UNI-PAK 21 TAB) 10 MG (21) TBPK tablet Use as directed  ? Tetrahydrozoline HCl (VISINE  OP) Place 1 drop into both eyes daily as needed (For dry eyes).  ? ?No facility-administered encounter medications on file as of 07/10/2021.  ? ? ?Review of Systems  ?Constitutional:  Negative for appetite change, chills, fatigue, fever and unexpected weight change.  ?     Generalized weakness  ?HENT:  Negative for congestion, dental problem, ear discharge, ear pain, facial swelling, hearing loss, nosebleeds, postnasal drip, rhinorrhea, sinus pressure, sinus pain, sneezing, sore throat, tinnitus and trouble swallowing.   ?Eyes:  Negative for pain, discharge, redness, itching and visual disturbance.  ?Respiratory:  Negative for cough, chest tightness, shortness of breath and wheezing.   ?Cardiovascular:  Negative for chest pain, palpitations and leg swelling.  ?Gastrointestinal:  Positive for diarrhea. Negative for abdominal distention, abdominal pain, blood in stool, constipation, nausea and vomiting.  ?Endocrine: Negative for cold intolerance, heat intolerance, polydipsia, polyphagia and polyuria.  ?Genitourinary:  Negative for difficulty urinating, dysuria, flank pain, frequency and urgency.  ?Musculoskeletal:  Positive for gait problem. Negative for arthralgias, back pain, joint swelling, myalgias, neck pain and neck stiffness.  ?Skin:  Negative for color change, pallor, rash and wound.  ?Neurological:  Negative for dizziness, syncope, speech difficulty, weakness, light-headedness, numbness and headaches.  ?Hematological:  Does not bruise/bleed easily.  ?Psychiatric/Behavioral:  Negative for agitation, behavioral problems, confusion, hallucinations, self-injury, sleep disturbance and suicidal ideas. The patient is not nervous/anxious.   ? ?Immunization History  ?Administered Date(s) Administered  ? Influenza Whole 05/16/2012  ? Influenza,inj,Quad PF,6+ Mos 03/02/2013, 04/19/2014, 02/22/2015, 03/23/2016, 03/24/2018, 01/18/2019, 02/07/2020, 01/03/2021  ? PFIZER(Purple Top)SARS-COV-2 Vaccination 07/11/2019,  08/18/2019, 04/10/2020, 08/08/2020  ? Pneumococcal Conjugate-13 02/22/2015  ? Pneumococcal Polysaccharide-23 03/02/2013  ? Tdap 10/07/2015  ? ?Pertinent  Health Maintenance Due  ?Topic Date Due  ? PAP SMEAR-Modifier  01/06/2021  ? MAMMOGRAM  12/26/2022  ? COLONOSCOPY (Pts 45-11yrs Insurance coverage will need to be confirmed)  07/02/2031  ? INFLUENZA VACCINE  Completed  ? ? ?  07/01/2021  ?  8:00 AM 07/01/2021  ?  8:00 PM 07/02/2021  ?  1:00 PM 07/07/2021  ?  1:06 PM 07/10/2021  ?  2:09 PM  ?Fall Risk  ?Falls in the past year?    1 1  ?Was there an injury with Fall?    0 0  ?Fall Risk Category Calculator    2 2  ?Fall Risk Category    Moderate Moderate  ?Patient Fall Risk Level Moderate fall risk Moderate fall risk Moderate fall risk Moderate fall risk Moderate fall risk  ?Patient at Risk for Falls Due to    History of fall(s) History of fall(s)  ?Fall risk Follow up    Falls evaluation completed Falls evaluation completed;Education provided;Falls prevention discussed  ? ?Functional Status Survey: ?  ? ?Vitals:  ? 07/10/21 1418  ?BP: 100/68  ?Pulse: 73  ?Resp: 16  ?Temp: (!) 97.2 ?F (36.2 ?C)  ?SpO2: 94%  ?Height: 5\' 5"  (1.651 m)  ? ?Body mass index is 31.55 kg/m?Marland Kitchen ?Physical Exam ?Vitals reviewed.  ?Constitutional:   ?  General: He is not in acute distress. ?   Appearance: Normal appearance. He is obese. He is not ill-appearing or diaphoretic.  ?HENT:  ?   Head: Normocephalic.  ?   Right Ear: Tympanic membrane, ear canal and external ear normal. There is no impacted cerumen.  ?   Left Ear: Tympanic membrane, ear canal and external ear normal. There is no impacted cerumen.  ?   Nose: Nose normal. No congestion or rhinorrhea.  ?   Mouth/Throat:  ?   Mouth: Mucous membranes are moist.  ?   Pharynx: Oropharynx is clear. No oropharyngeal exudate or posterior oropharyngeal erythema.  ?Eyes:  ?   General: No scleral icterus.    ?   Right eye: No discharge.     ?   Left eye: No discharge.  ?   Extraocular Movements: Extraocular  movements intact.  ?   Conjunctiva/sclera: Conjunctivae normal.  ?   Pupils: Pupils are equal, round, and reactive to light.  ?Neck:  ?   Vascular: No carotid bruit.  ?Cardiovascular:  ?   Rate and Rhythm: Nor

## 2021-07-11 ENCOUNTER — Telehealth: Payer: Self-pay

## 2021-07-11 DIAGNOSIS — E860 Dehydration: Secondary | ICD-10-CM | POA: Diagnosis not present

## 2021-07-11 DIAGNOSIS — E538 Deficiency of other specified B group vitamins: Secondary | ICD-10-CM | POA: Diagnosis not present

## 2021-07-11 DIAGNOSIS — E872 Acidosis, unspecified: Secondary | ICD-10-CM | POA: Diagnosis not present

## 2021-07-11 DIAGNOSIS — K529 Noninfective gastroenteritis and colitis, unspecified: Secondary | ICD-10-CM | POA: Diagnosis not present

## 2021-07-11 DIAGNOSIS — K760 Fatty (change of) liver, not elsewhere classified: Secondary | ICD-10-CM | POA: Diagnosis not present

## 2021-07-11 DIAGNOSIS — M545 Low back pain, unspecified: Secondary | ICD-10-CM | POA: Diagnosis not present

## 2021-07-11 DIAGNOSIS — Z87891 Personal history of nicotine dependence: Secondary | ICD-10-CM | POA: Diagnosis not present

## 2021-07-11 DIAGNOSIS — D696 Thrombocytopenia, unspecified: Secondary | ICD-10-CM | POA: Diagnosis not present

## 2021-07-11 DIAGNOSIS — I959 Hypotension, unspecified: Secondary | ICD-10-CM | POA: Diagnosis not present

## 2021-07-11 DIAGNOSIS — Z9181 History of falling: Secondary | ICD-10-CM | POA: Diagnosis not present

## 2021-07-11 DIAGNOSIS — G4700 Insomnia, unspecified: Secondary | ICD-10-CM | POA: Diagnosis not present

## 2021-07-11 DIAGNOSIS — G8929 Other chronic pain: Secondary | ICD-10-CM | POA: Diagnosis not present

## 2021-07-11 DIAGNOSIS — D539 Nutritional anemia, unspecified: Secondary | ICD-10-CM | POA: Diagnosis not present

## 2021-07-11 DIAGNOSIS — I10 Essential (primary) hypertension: Secondary | ICD-10-CM | POA: Diagnosis not present

## 2021-07-11 LAB — CBC MORPHOLOGY

## 2021-07-11 LAB — CBC WITH DIFFERENTIAL/PLATELET
Absolute Monocytes: 242 cells/uL (ref 200–950)
Basophils Absolute: 69 cells/uL (ref 0–200)
Basophils Relative: 1 %
Eosinophils Absolute: 69 cells/uL (ref 15–500)
Eosinophils Relative: 1 %
HCT: 24.3 % — ABNORMAL LOW (ref 35.0–45.0)
Hemoglobin: 8.3 g/dL — ABNORMAL LOW (ref 11.7–15.5)
Lymphs Abs: 966 cells/uL (ref 850–3900)
MCH: 35 pg — ABNORMAL HIGH (ref 27.0–33.0)
MCHC: 34.2 g/dL (ref 32.0–36.0)
MCV: 102.5 fL — ABNORMAL HIGH (ref 80.0–100.0)
MPV: 13.5 fL — ABNORMAL HIGH (ref 7.5–12.5)
Monocytes Relative: 3.5 %
Neutro Abs: 5555 cells/uL (ref 1500–7800)
Neutrophils Relative %: 80.5 %
Platelets: 138 10*3/uL — ABNORMAL LOW (ref 140–400)
RBC: 2.37 10*6/uL — ABNORMAL LOW (ref 3.80–5.10)
RDW: 21.9 % — ABNORMAL HIGH (ref 11.0–15.0)
Total Lymphocyte: 14 %
WBC: 6.9 10*3/uL (ref 3.8–10.8)

## 2021-07-11 LAB — COMPLETE METABOLIC PANEL WITH GFR
AG Ratio: 1 (calc) (ref 1.0–2.5)
ALT: 12 U/L (ref 6–29)
AST: 57 U/L — ABNORMAL HIGH (ref 10–35)
Albumin: 2.7 g/dL — ABNORMAL LOW (ref 3.6–5.1)
Alkaline phosphatase (APISO): 221 U/L — ABNORMAL HIGH (ref 37–153)
BUN/Creatinine Ratio: 18 (calc) (ref 6–22)
BUN: 7 mg/dL (ref 7–25)
CO2: 28 mmol/L (ref 20–32)
Calcium: 7.2 mg/dL — ABNORMAL LOW (ref 8.6–10.4)
Chloride: 101 mmol/L (ref 98–110)
Creat: 0.4 mg/dL — ABNORMAL LOW (ref 0.50–1.03)
Globulin: 2.6 g/dL (calc) (ref 1.9–3.7)
Glucose, Bld: 75 mg/dL (ref 65–139)
Potassium: 3.1 mmol/L — ABNORMAL LOW (ref 3.5–5.3)
Sodium: 142 mmol/L (ref 135–146)
Total Bilirubin: 3.1 mg/dL — ABNORMAL HIGH (ref 0.2–1.2)
Total Protein: 5.3 g/dL — ABNORMAL LOW (ref 6.1–8.1)
eGFR: 115 mL/min/{1.73_m2} (ref >=60)

## 2021-07-11 MED ORDER — POTASSIUM CHLORIDE CRYS ER 20 MEQ PO TBCR: 20.0000 meq | EXTENDED_RELEASE_TABLET | Freq: Every day | ORAL | 2 refills | Status: DC

## 2021-07-11 NOTE — Telephone Encounter (Signed)
-----   Message from Sharon Seller, NP sent at 07/11/2021  2:32 PM EDT ----- ?Hgb remains low but stable. Platelets also low at this time.  ?Potassium is very low- recommend taking potassium 20 meq 2 tablets today and then continue daily supplement ?Calcium level very low- to increase calcium to three times daily  ?Protein level and albumin VERY low- to take protein supplement twice daily (ensure, boost) ?Liver enzymes also elevated ?Lets have [name] come to office early next week to recheck blood work to make sure stable.  ?If he is feeling worse he needs to go back to the hospital immediately  ?

## 2021-07-11 NOTE — Telephone Encounter (Signed)
Please clarify the 6 supplements a day- is he taking potassium 20 meq daily? Needs to be on the prescription potassium as it is more effective.  ?Please send in new Rx if he is not taking  ?To stop nyquil at this time ?Please schedule labs next week ?

## 2021-07-11 NOTE — Telephone Encounter (Addendum)
Patient states it is not the prescription, he had to get supplement.   ?Patient agreed to start prescription and stopping nyquil.  ? ?Patient stated he would call back to schedule lab apnt.  ?

## 2021-07-11 NOTE — Telephone Encounter (Signed)
Patient states they take 6 supplements a day totaling 600 mg (99.9mg  6 tabs daily). Patient will increase calcium to TID. Patient already taking 1 /day meal replacement -protein=10 grams. Patient states everything is low diarrhea is ongoing and nightquil is whats affecting liver enzymes. Patient understands if he is feeling worse he needs to go back to the hospital immediately. ? ?To Bernita Buffy, NP ?

## 2021-07-14 NOTE — Telephone Encounter (Deleted)
Patient stated he would call back to schedule lab apnt.  ?

## 2021-07-15 ENCOUNTER — Telehealth: Payer: Self-pay

## 2021-07-15 DIAGNOSIS — K529 Noninfective gastroenteritis and colitis, unspecified: Secondary | ICD-10-CM | POA: Diagnosis not present

## 2021-07-15 DIAGNOSIS — M545 Low back pain, unspecified: Secondary | ICD-10-CM | POA: Diagnosis not present

## 2021-07-15 DIAGNOSIS — K760 Fatty (change of) liver, not elsewhere classified: Secondary | ICD-10-CM | POA: Diagnosis not present

## 2021-07-15 DIAGNOSIS — G4700 Insomnia, unspecified: Secondary | ICD-10-CM | POA: Diagnosis not present

## 2021-07-15 DIAGNOSIS — E538 Deficiency of other specified B group vitamins: Secondary | ICD-10-CM | POA: Diagnosis not present

## 2021-07-15 DIAGNOSIS — I10 Essential (primary) hypertension: Secondary | ICD-10-CM | POA: Diagnosis not present

## 2021-07-15 DIAGNOSIS — D696 Thrombocytopenia, unspecified: Secondary | ICD-10-CM | POA: Diagnosis not present

## 2021-07-15 DIAGNOSIS — G8929 Other chronic pain: Secondary | ICD-10-CM | POA: Diagnosis not present

## 2021-07-15 DIAGNOSIS — D539 Nutritional anemia, unspecified: Secondary | ICD-10-CM | POA: Diagnosis not present

## 2021-07-15 DIAGNOSIS — Z87891 Personal history of nicotine dependence: Secondary | ICD-10-CM | POA: Diagnosis not present

## 2021-07-15 DIAGNOSIS — E860 Dehydration: Secondary | ICD-10-CM | POA: Diagnosis not present

## 2021-07-15 DIAGNOSIS — E872 Acidosis, unspecified: Secondary | ICD-10-CM | POA: Diagnosis not present

## 2021-07-15 DIAGNOSIS — Z9181 History of falling: Secondary | ICD-10-CM | POA: Diagnosis not present

## 2021-07-15 DIAGNOSIS — I959 Hypotension, unspecified: Secondary | ICD-10-CM | POA: Diagnosis not present

## 2021-07-15 NOTE — Telephone Encounter (Signed)
1) okay for home health services ?2) will need more information on this- may need in office visit ?3) okay to get [facility]A C&S ?4) okay to approve  ?

## 2021-07-15 NOTE — Telephone Encounter (Addendum)
Laura [name] from [facility] said she would relay the information to the patient regarding scheduling an in office visit to discuss the swelling in both legs and get a [facility]A C&S since, they do not have a nurse in.  ? ?Gave verbal orders for Home Health services and asked if we could fax the orders requesting the bedside commode to [employer].  ? ? ?

## 2021-07-15 NOTE — Telephone Encounter (Signed)
Laura [name] from [facility] called regarding; ?Requesting verbal orders for [facility] services for once a week for 4 weeks ?Stated patient is complaining of pain in both legs ?Stated patient is burning in private area when urinates ?Patient requesting orders for bedside commode  ? ?Please advise. ?

## 2021-07-16 NOTE — Telephone Encounter (Signed)
Patient has pending appointment for 07/18/21 with Dinah at 2:15pm.  ?

## 2021-07-16 NOTE — Telephone Encounter (Signed)
Agree needs OV

## 2021-07-18 ENCOUNTER — Ambulatory Visit: Payer: Medicare Other | Admitting: Family

## 2021-07-18 ENCOUNTER — Other Ambulatory Visit: Payer: Self-pay

## 2021-07-18 ENCOUNTER — Telehealth: Payer: Self-pay

## 2021-07-18 DIAGNOSIS — G8929 Other chronic pain: Secondary | ICD-10-CM | POA: Diagnosis not present

## 2021-07-18 DIAGNOSIS — K760 Fatty (change of) liver, not elsewhere classified: Secondary | ICD-10-CM | POA: Diagnosis not present

## 2021-07-18 DIAGNOSIS — I10 Essential (primary) hypertension: Secondary | ICD-10-CM | POA: Diagnosis not present

## 2021-07-18 DIAGNOSIS — K529 Noninfective gastroenteritis and colitis, unspecified: Secondary | ICD-10-CM | POA: Diagnosis not present

## 2021-07-18 DIAGNOSIS — D539 Nutritional anemia, unspecified: Secondary | ICD-10-CM | POA: Diagnosis not present

## 2021-07-18 DIAGNOSIS — G4700 Insomnia, unspecified: Secondary | ICD-10-CM | POA: Diagnosis not present

## 2021-07-18 DIAGNOSIS — Z9181 History of falling: Secondary | ICD-10-CM | POA: Diagnosis not present

## 2021-07-18 DIAGNOSIS — Z87891 Personal history of nicotine dependence: Secondary | ICD-10-CM | POA: Diagnosis not present

## 2021-07-18 DIAGNOSIS — E538 Deficiency of other specified B group vitamins: Secondary | ICD-10-CM | POA: Diagnosis not present

## 2021-07-18 DIAGNOSIS — M545 Low back pain, unspecified: Secondary | ICD-10-CM | POA: Diagnosis not present

## 2021-07-18 DIAGNOSIS — E860 Dehydration: Secondary | ICD-10-CM | POA: Diagnosis not present

## 2021-07-18 DIAGNOSIS — E872 Acidosis, unspecified: Secondary | ICD-10-CM | POA: Diagnosis not present

## 2021-07-18 DIAGNOSIS — D696 Thrombocytopenia, unspecified: Secondary | ICD-10-CM | POA: Diagnosis not present

## 2021-07-18 DIAGNOSIS — I959 Hypotension, unspecified: Secondary | ICD-10-CM | POA: Diagnosis not present

## 2021-07-18 NOTE — Telephone Encounter (Signed)
Patient notified and refill will be done on Wednesday, 07/23/21 ?

## 2021-07-18 NOTE — Telephone Encounter (Signed)
Shanon Brow from [facility] called requesting a skilled nurse evaluation regarding the GI issues the patient has been going through. Shanon Brow stated that "he has been having chronic diarrhea for the past 3-4 months and wants to discuss medication and diet changes with skilled nursing".  ? ?Please advise. Is verbal order okay for Skilled nursing?  ? ?

## 2021-07-18 NOTE — Telephone Encounter (Signed)
Patient called requesting a refill on Oxycodone. Contract was last signed on 01/03/21 and the last refill was on 06/19/21. Pending prescription was sent to Lauree Chandler, NP for approval.  ? ?Please advise.  ?

## 2021-07-18 NOTE — Telephone Encounter (Signed)
Verbal orders given for skilled nursing evaluation.  ?

## 2021-07-18 NOTE — Telephone Encounter (Signed)
Yes ok 

## 2021-07-20 ENCOUNTER — Encounter (HOSPITAL_COMMUNITY): Payer: Self-pay | Admitting: Emergency Medicine

## 2021-07-20 ENCOUNTER — Emergency Department (HOSPITAL_COMMUNITY)
Admission: EM | Admit: 2021-07-20 | Discharge: 2021-07-20 | Disposition: A | Discharge status: Home / Self Care | Payer: Medicare Other | Attending: Emergency Medicine | Admitting: Emergency Medicine

## 2021-07-20 ENCOUNTER — Other Ambulatory Visit: Payer: Self-pay

## 2021-07-20 ENCOUNTER — Emergency Department (HOSPITAL_COMMUNITY): Payer: Medicare Other

## 2021-07-20 DIAGNOSIS — E876 Hypokalemia: Secondary | ICD-10-CM | POA: Insufficient documentation

## 2021-07-20 DIAGNOSIS — I959 Hypotension, unspecified: Secondary | ICD-10-CM | POA: Diagnosis not present

## 2021-07-20 DIAGNOSIS — D649 Anemia, unspecified: Secondary | ICD-10-CM | POA: Diagnosis not present

## 2021-07-20 DIAGNOSIS — R609 Edema, unspecified: Secondary | ICD-10-CM

## 2021-07-20 DIAGNOSIS — R0602 Shortness of breath: Secondary | ICD-10-CM | POA: Diagnosis not present

## 2021-07-20 DIAGNOSIS — Z743 Need for continuous supervision: Secondary | ICD-10-CM | POA: Diagnosis not present

## 2021-07-20 DIAGNOSIS — R600 Localized edema: Secondary | ICD-10-CM | POA: Insufficient documentation

## 2021-07-20 DIAGNOSIS — M7989 Other specified soft tissue disorders: Secondary | ICD-10-CM | POA: Diagnosis present

## 2021-07-20 LAB — CBC
HCT: 28.8 % — ABNORMAL LOW (ref 36.0–46.0)
Hemoglobin: 9.1 g/dL — ABNORMAL LOW (ref 12.0–15.0)
MCH: 35.1 pg — ABNORMAL HIGH (ref 26.0–34.0)
MCHC: 31.6 g/dL (ref 30.0–36.0)
MCV: 111.2 fL — ABNORMAL HIGH (ref 80.0–100.0)
Platelets: UNDETERMINED 10*3/uL (ref 150–400)
RBC: 2.59 MIL/uL — ABNORMAL LOW (ref 3.87–5.11)
RDW: 26.5 % — ABNORMAL HIGH (ref 11.5–15.5)
WBC: 10.4 10*3/uL (ref 4.0–10.5)
nRBC: 0.2 % (ref 0.0–0.2)

## 2021-07-20 LAB — BRAIN NATRIURETIC PEPTIDE: B Natriuretic Peptide: 167.7 pg/mL — ABNORMAL HIGH (ref 0.0–100.0)

## 2021-07-20 LAB — BASIC METABOLIC PANEL
Anion gap: 24 — ABNORMAL HIGH (ref 5–15)
BUN: 8 mg/dL (ref 6–20)
CO2: 18 mmol/L — ABNORMAL LOW (ref 22–32)
Calcium: 8.3 mg/dL — ABNORMAL LOW (ref 8.9–10.3)
Chloride: 96 mmol/L — ABNORMAL LOW (ref 98–111)
Creatinine, Ser: 0.87 mg/dL (ref 0.44–1.00)
GFR, Estimated: >60 mL/min (ref >60)
Glucose, Bld: 72 mg/dL (ref 70–99)
Potassium: 3.1 mmol/L — ABNORMAL LOW (ref 3.5–5.1)
Sodium: 138 mmol/L (ref 135–145)

## 2021-07-20 IMAGING — CR DG CHEST 2V
2 series · 2 of 2 positions shown · non-contrast
Comparison: [DATE]

CLINICAL DATA: Shortness of breath

EXAM:
CHEST - 2 VIEW

[chest lat]
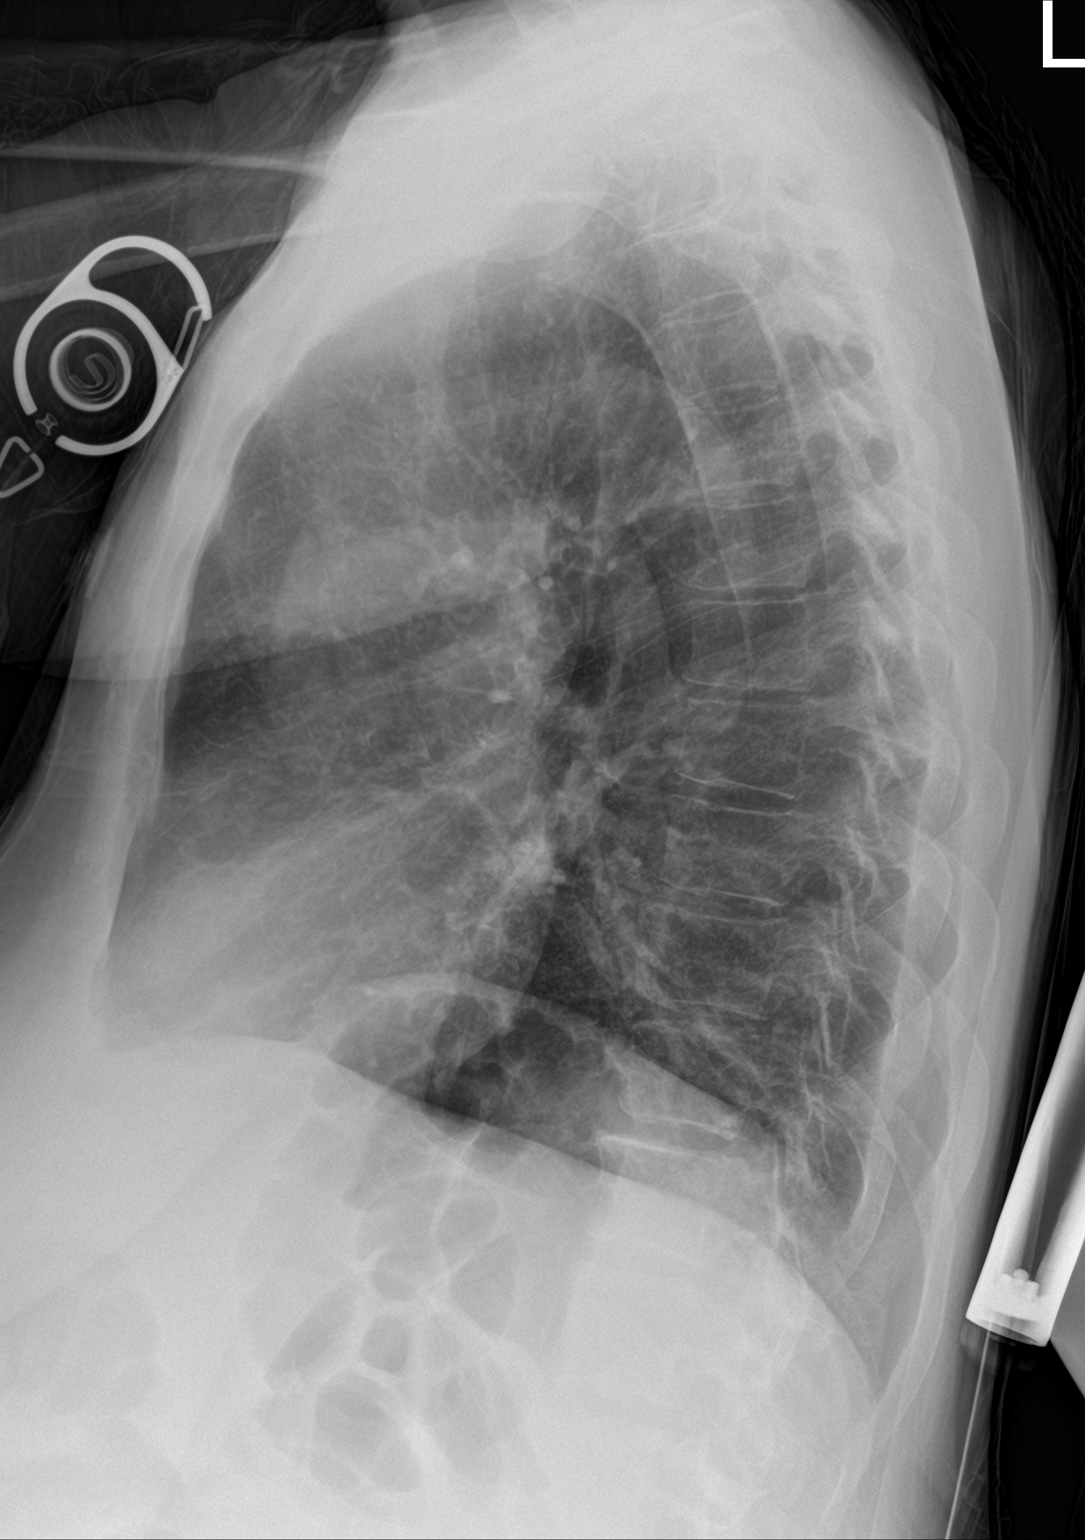

[chest ap]
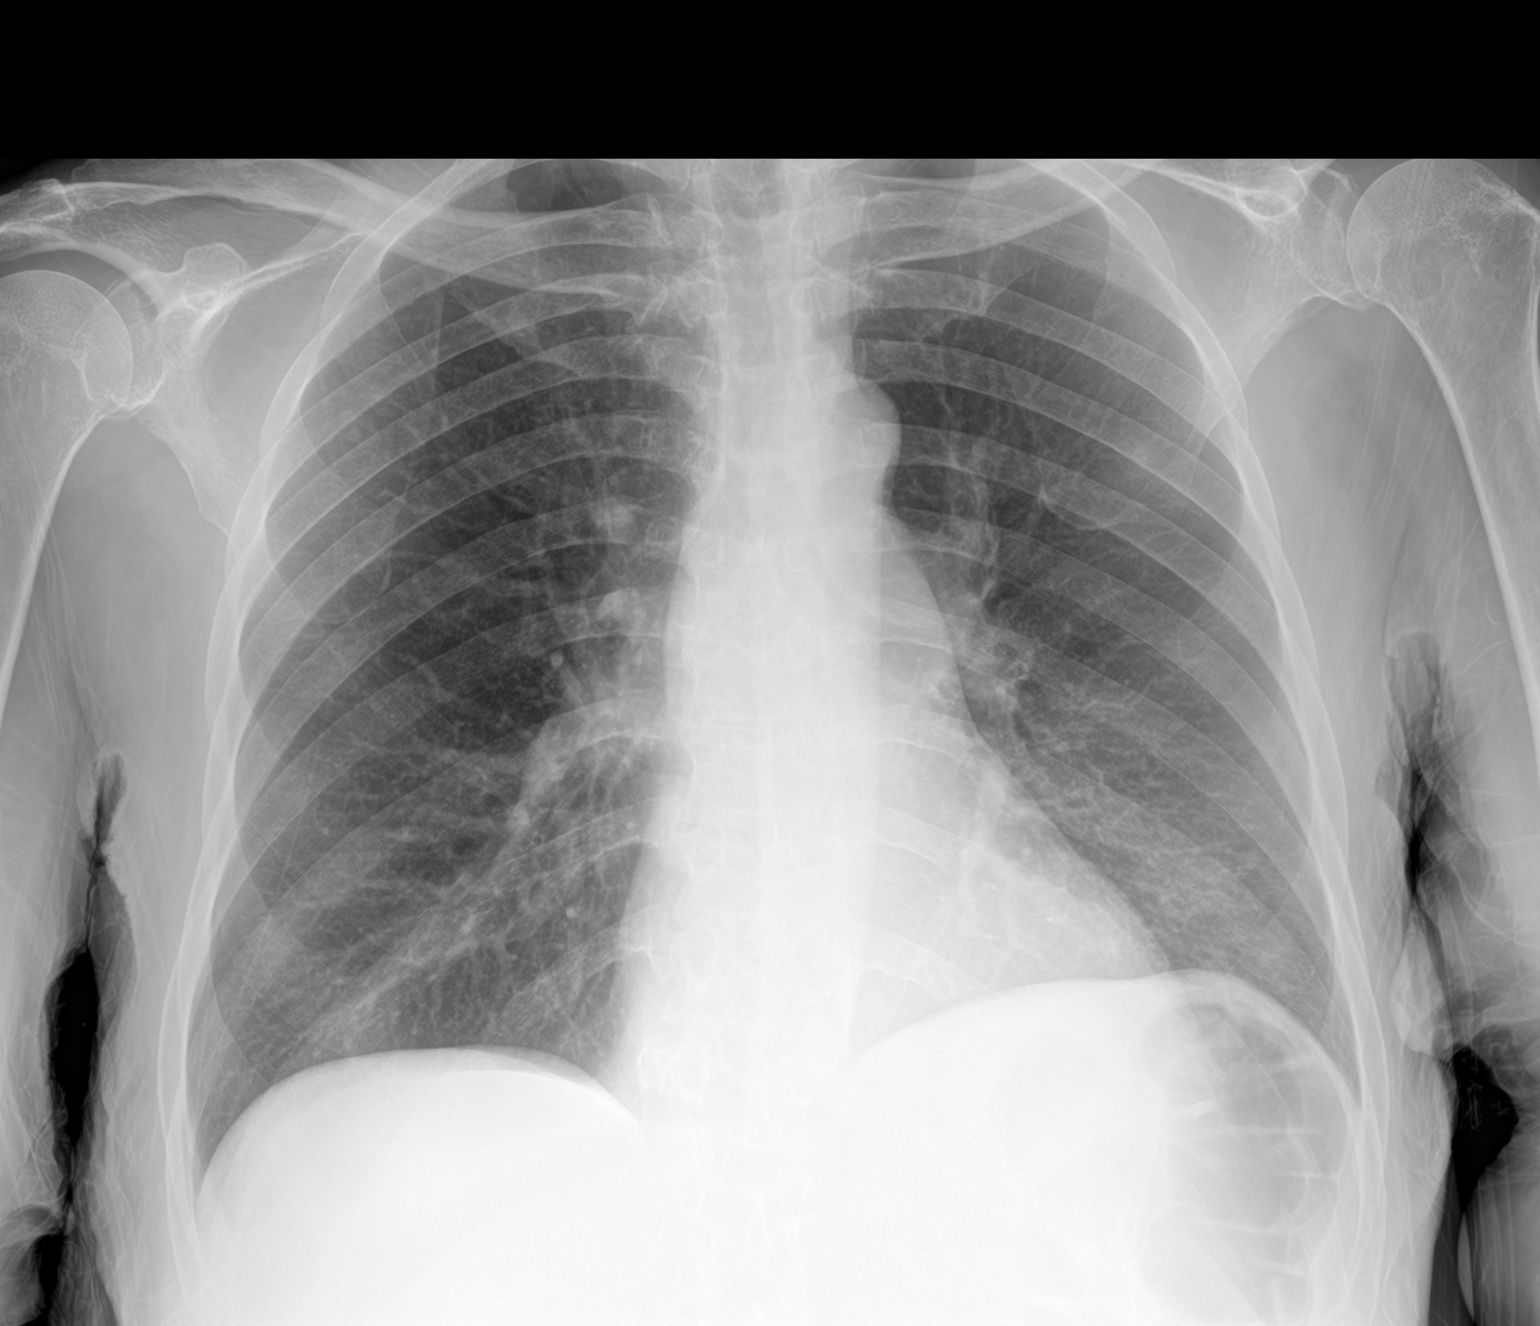

[2 of 2 positions shown; findings below may reference images not displayed]

FINDINGS: The heart size and mediastinal contours are within normal limits.
Mildly prominent bibasilar interstitial markings. No pleural
effusion or pneumothorax. The visualized skeletal structures are
unremarkable.
IMPRESSION: Mildly prominent bibasilar interstitial markings, which may reflect
bronchitic type lung changes versus developing atypical/viral
infection.

## 2021-07-20 MED ORDER — FUROSEMIDE 10 MG/ML IJ SOLN: 40.0000 mg | Freq: Once | INTRAMUSCULAR | Status: DC

## 2021-07-20 MED ORDER — FUROSEMIDE 20 MG PO TABS: 20.0000 mg | ORAL_TABLET | Freq: Every day | ORAL | 0 refills | Status: DC

## 2021-07-20 MED ORDER — POTASSIUM CHLORIDE CRYS ER 20 MEQ PO TBCR: 20.0000 meq | EXTENDED_RELEASE_TABLET | Freq: Every day | ORAL | 0 refills | Status: DC

## 2021-07-20 MED ORDER — POTASSIUM CHLORIDE CRYS ER 20 MEQ PO TBCR
40.0000 meq | EXTENDED_RELEASE_TABLET | Freq: Once | ORAL | Status: AC
  Administered 2021-07-20: 40 meq via ORAL
  Filled 2021-07-20: qty 2

## 2021-07-20 MED ORDER — FUROSEMIDE 20 MG PO TABS
40.0000 mg | ORAL_TABLET | Freq: Once | ORAL | Status: AC
  Administered 2021-07-20: 40 mg via ORAL
  Filled 2021-07-20: qty 2

## 2021-07-20 NOTE — ED Provider Notes (Signed)
?[facility] ?Provider Note ? ? ?CSN: [id number] ?Arrival date & time: 07/20/21  1843 ? ?  ? ?History ? ?Chief Complaint  ?Patient presents with  ? Foot Swelling  ? ? ?Laura [name] is a 61 y.o. adult. ? ?Patient presents to ER chief complaint of bilateral leg swelling.  She is noticesd bilaterally for about 2 to 3 weeks.  She states he was admitted to the hospital 3 weeks ago for dehydration and low blood pressure and given a lot of IV fluids.  Subsequently has noticed foot swelling that has not really gone away.  Otherwise denies chest pain denies fevers or vomiting or diarrhea. ? ? ?  ? ?Home Medications ?Prior to Admission medications   ?Medication Sig Start Date End Date Taking? Authorizing Provider  ?albuterol (VENTOLIN HFA) 108 (90 Base) MCG/ACT inhaler INHALE 2 PUFFS INTO THE LUNGS EVERY 6 HOURS AS NEEDED FOR WHEEZING OR SHORTNESS OF BREATH ?Patient taking differently: Inhale 2 puffs into the lungs every 6 (six) hours as needed for wheezing or shortness of breath. 05/19/19  Yes Lauree Chandler, NP  ?calcium carbonate (OSCAL) 1500 (600 Ca) MG TABS tablet Take 600 mg of elemental calcium by mouth 3 (three) times daily.   Yes [provider]  ?diphenhydrAMINE (BENADRYL) 25 MG tablet Take 25 mg by mouth every 6 (six) hours as needed for allergies.   Yes [provider]  ?folic acid (FOLVITE) 1 MG tablet Take 2 tablets (2 mg total) by mouth daily. 02/08/20  Yes Lauree Chandler, NP  ?furosemide (LASIX) 20 MG tablet Take 1 tablet (20 mg total) by mouth daily for 7 days. 07/20/21 07/27/21 Yes Luna Fuse, MD  ?oxyCODONE-acetaminophen (PERCOCET/ROXICET) 5-325 MG tablet Take one tablet by mouth every 8-12 hours as needed for pain. ?Patient taking differently: Take 1 tablet by mouth every 8 (eight) hours as needed for moderate pain. 06/19/21  Yes Lauree Chandler, NP  ?potassium chloride SA (KLOR-CON M) 20 MEQ tablet Take 1 tablet (20 mEq total) by mouth daily for  7 days. 07/20/21 07/27/21 Yes Luna Fuse, MD  ?predniSONE (STERAPRED UNI-PAK 21 TAB) 10 MG (21) TBPK tablet Use as directed ?Patient taking differently: Take 10 mg by mouth See admin instructions. 6,5,4,3,2,1 07/07/21  Yes Lauree Chandler, NP  ?Tetrahydrozoline HCl (VISINE OP) Place 1 drop into both eyes daily as needed (For dry eyes).   Yes [provider]  ?   ? ?Allergies    ?Garlic, Aspirin, Lyrica [pregabalin], and Penicillins   ? ?Review of Systems   ?Review of Systems  ?Constitutional:  Negative for fever.  ?HENT:  Negative for ear pain and sore throat.   ?Eyes:  Negative for pain.  ?Respiratory:  Positive for cough.   ?Cardiovascular:  Negative for chest pain.  ?Gastrointestinal:  Negative for abdominal pain.  ?Genitourinary:  Negative for flank pain.  ?Musculoskeletal:  Negative for back pain.  ?Skin:  Negative for color change and rash.  ?Neurological:  Negative for syncope.  ?All other systems reviewed and are negative. ? ?Physical Exam ?Updated Vital Signs ?BP 134/67   Pulse 94   Temp 98.6 ?F (37 ?C) (Oral)   Resp 20   SpO2 98%  ?Physical Exam ?Constitutional:   ?   Appearance: He is well-developed.  ?HENT:  ?   Head: Normocephalic.  ?   Nose: Nose normal.  ?Eyes:  ?   Extraocular Movements: Extraocular movements intact.  ?Cardiovascular:  ?   Rate  and Rhythm: Normal rate.  ?Pulmonary:  ?   Effort: Pulmonary effort is normal.  ?Musculoskeletal:  ?   Right lower leg: Edema present.  ?   Left lower leg: Edema present.  ?   Comments: No abnormal warmth or cellulitis noted.  ?Skin: ?   Coloration: Skin is not jaundiced.  ?Neurological:  ?   General: No focal deficit present.  ?   Mental Status: He is alert and oriented to person, place, and time. Mental status is at baseline.  ? ? ?ED Results / Procedures / Treatments   ?Labs ?(all labs ordered are listed, but only abnormal results are displayed) ?Labs Reviewed  ?BASIC METABOLIC PANEL - Abnormal; Notable for the following components:  ?     Result Value  ? Potassium 3.1 (*)   ? Chloride 96 (*)   ? CO2 18 (*)   ? Calcium 8.3 (*)   ? Anion gap 24 (*)   ? All other components within normal limits  ?CBC - Abnormal; Notable for the following components:  ? RBC 2.59 (*)   ? Hemoglobin 9.1 (*)   ? HCT 28.8 (*)   ? MCV 111.2 (*)   ? MCH 35.1 (*)   ? RDW 26.5 (*)   ? All other components within normal limits  ?BRAIN NATRIURETIC PEPTIDE - Abnormal; Notable for the following components:  ? B Natriuretic Peptide 167.7 (*)   ? All other components within normal limits  ? ? ?EKG ?EKG Interpretation ? ?Date/Time:  Sunday July 20 2021 18:55:21 EDT ?Ventricular Rate:  94 ?PR Interval:  112 ?QRS Duration: 80 ?QT Interval:  368 ?QTC Calculation: 460 ?R Axis:   37 ?Text Interpretation: Sinus rhythm with Premature atrial complexes Nonspecific ST abnormality Abnormal ECG When compared with ECG of 29-Jun-2021 15:26, PREVIOUS ECG IS PRESENT Confirmed by Thamas Jaegers ([id number]) on 07/20/2021 8:53:49 PM ? ?Radiology ?DG Chest 2 View ? ?Result Date: 07/20/2021 ?CLINICAL DATA:  Shortness of breath EXAM: CHEST - 2 VIEW COMPARISON:  06/29/2021 FINDINGS: The heart size and mediastinal contours are within normal limits. Mildly prominent bibasilar interstitial markings. No pleural effusion or pneumothorax. The visualized skeletal structures are unremarkable. IMPRESSION: Mildly prominent bibasilar interstitial markings, which may reflect bronchitic type lung changes versus developing atypical/viral infection. Electronically Signed   By: Davina Poke D.O.   On: 07/20/2021 19:18   ? ?Procedures ?Procedures  ? ? ?Medications Ordered in ED ?Medications  ?potassium chloride SA (KLOR-CON M) CR tablet 40 mEq (40 mEq Oral Given 07/20/21 2126)  ?furosemide (LASIX) tablet 40 mg (40 mg Oral Given 07/20/21 2301)  ? ? ?ED Course/ Medical Decision Making/ A&P ?Clinical Course as of 07/20/21 2302  ?Nancy Fetter Jul 20, 2021  ?1904 Attempted to order vascular study from waiting room prior to 7 [MV]  ?  ?Clinical  Course User Index ?[MV] Eustaquio Maize, PA-C  ? ?                        ?Medical Decision Making ?Amount and/or Complexity of Data Reviewed ?Labs: ordered. ?Radiology: ordered. ? ?Risk ?Prescription drug management. ? ? ?Labs are sent unremarkable potassium slightly low 3.1 given oral repletion here in the ER.  Vital signs otherwise stable.  Chest x-ray concerning for mild congestion. ? ?Patient given Lasix here in the ER.  Given a short prescription of Lasix to go home with. ? ?Advised outpatient follow-up with her doctor within the week.  Advising immediate return for worsening symptoms  difficulty breathing pain or any additional concerns. ? ? ? ? ? ? ? ?Final Clinical Impression(s) / ED Diagnoses ?Final diagnoses:  ?Peripheral edema  ? ? ?Rx / DC Orders ?ED Discharge Orders   ? ?      Ordered  ?  furosemide (LASIX) 20 MG tablet  Daily       ? 07/20/21 2259  ?  potassium chloride SA (KLOR-CON M) 20 MEQ tablet  Daily       ? 07/20/21 2259  ? ?  ?  ? ?  ? ? ?  ?Luna Fuse, MD ?07/20/21 2302 ? ?

## 2021-07-20 NOTE — ED Triage Notes (Signed)
Pt to triage via [facility] from home.  Reports pain and swelling to L foot and leg x 1 1/2 weeks.  Reports SOB with exertion. ?

## 2021-07-20 NOTE — Discharge Instructions (Signed)
Call your primary care doctor or specialist as discussed in the next 2-3 days.   Return immediately back to the ER if:  Your symptoms worsen within the next 12-24 hours. You develop new symptoms such as new fevers, persistent vomiting, new pain, shortness of breath, or new weakness or numbness, or if you have any other concerns.  

## 2021-07-21 ENCOUNTER — Telehealth: Payer: Self-pay | Admitting: *Deleted

## 2021-07-21 ENCOUNTER — Telehealth: Payer: Self-pay

## 2021-07-21 DIAGNOSIS — I959 Hypotension, unspecified: Secondary | ICD-10-CM | POA: Diagnosis not present

## 2021-07-21 DIAGNOSIS — M545 Low back pain, unspecified: Secondary | ICD-10-CM | POA: Diagnosis not present

## 2021-07-21 DIAGNOSIS — G4700 Insomnia, unspecified: Secondary | ICD-10-CM | POA: Diagnosis not present

## 2021-07-21 DIAGNOSIS — I10 Essential (primary) hypertension: Secondary | ICD-10-CM | POA: Diagnosis not present

## 2021-07-21 DIAGNOSIS — G8929 Other chronic pain: Secondary | ICD-10-CM | POA: Diagnosis not present

## 2021-07-21 DIAGNOSIS — E872 Acidosis, unspecified: Secondary | ICD-10-CM | POA: Diagnosis not present

## 2021-07-21 DIAGNOSIS — Z9181 History of falling: Secondary | ICD-10-CM | POA: Diagnosis not present

## 2021-07-21 DIAGNOSIS — D539 Nutritional anemia, unspecified: Secondary | ICD-10-CM | POA: Diagnosis not present

## 2021-07-21 DIAGNOSIS — D696 Thrombocytopenia, unspecified: Secondary | ICD-10-CM | POA: Diagnosis not present

## 2021-07-21 DIAGNOSIS — K760 Fatty (change of) liver, not elsewhere classified: Secondary | ICD-10-CM | POA: Diagnosis not present

## 2021-07-21 DIAGNOSIS — Z87891 Personal history of nicotine dependence: Secondary | ICD-10-CM | POA: Diagnosis not present

## 2021-07-21 DIAGNOSIS — E860 Dehydration: Secondary | ICD-10-CM | POA: Diagnosis not present

## 2021-07-21 DIAGNOSIS — K529 Noninfective gastroenteritis and colitis, unspecified: Secondary | ICD-10-CM | POA: Diagnosis not present

## 2021-07-21 DIAGNOSIS — E538 Deficiency of other specified B group vitamins: Secondary | ICD-10-CM | POA: Diagnosis not present

## 2021-07-21 NOTE — Telephone Encounter (Signed)
April ,RN w/ [facility] called requesting verbal home health orders for patient. ?She like to do twice this week and once for 4 weeks. April contact number is [phone]. ?

## 2021-07-21 NOTE — Telephone Encounter (Signed)
Laura [name] with [facility] called and left message on Clinical intake requesting verbal orders for SN to evaluate and treat.  ?Verbal orders given.  ?

## 2021-07-21 NOTE — Telephone Encounter (Signed)
Okay to approve.

## 2021-07-22 ENCOUNTER — Telehealth: Payer: Self-pay | Admitting: *Deleted

## 2021-07-22 NOTE — Telephone Encounter (Signed)
April Notified and agreed.  ?

## 2021-07-22 NOTE — Telephone Encounter (Signed)
Transition Care Management Unsuccessful Follow-up Telephone Call ? ?Date of discharge and from where:  07/20/2021 [facility] ? ?Attempts:  1st Attempt ? ?Reason for unsuccessful TCM follow-up call:  Left voice message to return call.  ? ?  ?

## 2021-07-23 ENCOUNTER — Other Ambulatory Visit: Payer: Self-pay | Admitting: *Deleted

## 2021-07-23 DIAGNOSIS — G8929 Other chronic pain: Secondary | ICD-10-CM

## 2021-07-23 NOTE — Telephone Encounter (Signed)
Patient called requesting a refill on Oxycodone. Contract was last signed on 01/03/21 and the last refill was on 06/19/21. Pending prescription was sent to [place], Jessica K, NP for approval.  ? ?Please advise.  ?

## 2021-07-23 NOTE — Telephone Encounter (Signed)
Transition Care Management Unsuccessful Follow-up Telephone Call ? ?Date of discharge and from where:  07/20/2021 [facility] ? ?Attempts:  2nd Attempt ? ?Reason for unsuccessful TCM follow-up call:  Left voice message ? ?  ?

## 2021-07-24 DIAGNOSIS — K529 Noninfective gastroenteritis and colitis, unspecified: Secondary | ICD-10-CM | POA: Diagnosis not present

## 2021-07-24 DIAGNOSIS — G8929 Other chronic pain: Secondary | ICD-10-CM | POA: Diagnosis not present

## 2021-07-24 DIAGNOSIS — M545 Low back pain, unspecified: Secondary | ICD-10-CM | POA: Diagnosis not present

## 2021-07-24 DIAGNOSIS — I10 Essential (primary) hypertension: Secondary | ICD-10-CM | POA: Diagnosis not present

## 2021-07-24 DIAGNOSIS — E860 Dehydration: Secondary | ICD-10-CM | POA: Diagnosis not present

## 2021-07-24 DIAGNOSIS — E872 Acidosis, unspecified: Secondary | ICD-10-CM | POA: Diagnosis not present

## 2021-07-24 DIAGNOSIS — I959 Hypotension, unspecified: Secondary | ICD-10-CM | POA: Diagnosis not present

## 2021-07-24 DIAGNOSIS — E538 Deficiency of other specified B group vitamins: Secondary | ICD-10-CM | POA: Diagnosis not present

## 2021-07-24 DIAGNOSIS — Z87891 Personal history of nicotine dependence: Secondary | ICD-10-CM | POA: Diagnosis not present

## 2021-07-24 DIAGNOSIS — Z9181 History of falling: Secondary | ICD-10-CM | POA: Diagnosis not present

## 2021-07-24 DIAGNOSIS — D539 Nutritional anemia, unspecified: Secondary | ICD-10-CM | POA: Diagnosis not present

## 2021-07-24 DIAGNOSIS — K760 Fatty (change of) liver, not elsewhere classified: Secondary | ICD-10-CM | POA: Diagnosis not present

## 2021-07-24 DIAGNOSIS — D696 Thrombocytopenia, unspecified: Secondary | ICD-10-CM | POA: Diagnosis not present

## 2021-07-24 DIAGNOSIS — G4700 Insomnia, unspecified: Secondary | ICD-10-CM | POA: Diagnosis not present

## 2021-07-24 MED ORDER — OXYCODONE-ACETAMINOPHEN 5-325 MG PO TABS: ORAL_TABLET | ORAL | 0 refills | Status: DC

## 2021-07-25 DIAGNOSIS — E860 Dehydration: Secondary | ICD-10-CM | POA: Diagnosis not present

## 2021-07-25 DIAGNOSIS — Z9181 History of falling: Secondary | ICD-10-CM | POA: Diagnosis not present

## 2021-07-25 DIAGNOSIS — I10 Essential (primary) hypertension: Secondary | ICD-10-CM | POA: Diagnosis not present

## 2021-07-25 DIAGNOSIS — G8929 Other chronic pain: Secondary | ICD-10-CM | POA: Diagnosis not present

## 2021-07-25 DIAGNOSIS — I959 Hypotension, unspecified: Secondary | ICD-10-CM | POA: Diagnosis not present

## 2021-07-25 DIAGNOSIS — D539 Nutritional anemia, unspecified: Secondary | ICD-10-CM | POA: Diagnosis not present

## 2021-07-25 DIAGNOSIS — K760 Fatty (change of) liver, not elsewhere classified: Secondary | ICD-10-CM | POA: Diagnosis not present

## 2021-07-25 DIAGNOSIS — K529 Noninfective gastroenteritis and colitis, unspecified: Secondary | ICD-10-CM | POA: Diagnosis not present

## 2021-07-25 DIAGNOSIS — E538 Deficiency of other specified B group vitamins: Secondary | ICD-10-CM | POA: Diagnosis not present

## 2021-07-25 DIAGNOSIS — D696 Thrombocytopenia, unspecified: Secondary | ICD-10-CM | POA: Diagnosis not present

## 2021-07-25 DIAGNOSIS — E872 Acidosis, unspecified: Secondary | ICD-10-CM | POA: Diagnosis not present

## 2021-07-25 DIAGNOSIS — G4700 Insomnia, unspecified: Secondary | ICD-10-CM | POA: Diagnosis not present

## 2021-07-25 DIAGNOSIS — Z87891 Personal history of nicotine dependence: Secondary | ICD-10-CM | POA: Diagnosis not present

## 2021-07-25 DIAGNOSIS — M545 Low back pain, unspecified: Secondary | ICD-10-CM | POA: Diagnosis not present

## 2021-07-29 ENCOUNTER — Inpatient Hospital Stay (HOSPITAL_COMMUNITY)
Admission: EM | Admit: 2021-07-29 | Discharge: 2021-08-04 | DRG: 442 | Disposition: A | Discharge status: Home Health | Payer: Medicare Other | Attending: Internal Medicine | Admitting: Internal Medicine

## 2021-07-29 ENCOUNTER — Encounter (HOSPITAL_COMMUNITY): Payer: Self-pay | Admitting: Emergency Medicine

## 2021-07-29 ENCOUNTER — Telehealth: Payer: Self-pay | Admitting: *Deleted

## 2021-07-29 ENCOUNTER — Emergency Department (HOSPITAL_COMMUNITY): Payer: Medicare Other

## 2021-07-29 DIAGNOSIS — E8809 Other disorders of plasma-protein metabolism, not elsewhere classified: Secondary | ICD-10-CM | POA: Diagnosis present

## 2021-07-29 DIAGNOSIS — K760 Fatty (change of) liver, not elsewhere classified: Secondary | ICD-10-CM | POA: Diagnosis not present

## 2021-07-29 DIAGNOSIS — G4700 Insomnia, unspecified: Secondary | ICD-10-CM | POA: Diagnosis present

## 2021-07-29 DIAGNOSIS — D649 Anemia, unspecified: Principal | ICD-10-CM

## 2021-07-29 DIAGNOSIS — R000 Tachycardia, unspecified: Secondary | ICD-10-CM | POA: Diagnosis not present

## 2021-07-29 DIAGNOSIS — Z8261 Family history of arthritis: Secondary | ICD-10-CM | POA: Diagnosis not present

## 2021-07-29 DIAGNOSIS — Z886 Allergy status to analgesic agent status: Secondary | ICD-10-CM

## 2021-07-29 DIAGNOSIS — M545 Low back pain, unspecified: Secondary | ICD-10-CM | POA: Diagnosis present

## 2021-07-29 DIAGNOSIS — I9589 Other hypotension: Secondary | ICD-10-CM | POA: Diagnosis not present

## 2021-07-29 DIAGNOSIS — L304 Erythema intertrigo: Secondary | ICD-10-CM | POA: Diagnosis present

## 2021-07-29 DIAGNOSIS — I10 Essential (primary) hypertension: Secondary | ICD-10-CM | POA: Diagnosis not present

## 2021-07-29 DIAGNOSIS — D539 Nutritional anemia, unspecified: Secondary | ICD-10-CM | POA: Diagnosis not present

## 2021-07-29 DIAGNOSIS — H1089 Other conjunctivitis: Secondary | ICD-10-CM | POA: Diagnosis present

## 2021-07-29 DIAGNOSIS — Z888 Allergy status to other drugs, medicaments and biological substances status: Secondary | ICD-10-CM

## 2021-07-29 DIAGNOSIS — J9811 Atelectasis: Secondary | ICD-10-CM | POA: Diagnosis not present

## 2021-07-29 DIAGNOSIS — F17210 Nicotine dependence, cigarettes, uncomplicated: Secondary | ICD-10-CM | POA: Diagnosis not present

## 2021-07-29 DIAGNOSIS — B964 Proteus (mirabilis) (morganii) as the cause of diseases classified elsewhere: Secondary | ICD-10-CM | POA: Diagnosis present

## 2021-07-29 DIAGNOSIS — R0602 Shortness of breath: Secondary | ICD-10-CM | POA: Diagnosis not present

## 2021-07-29 DIAGNOSIS — N390 Urinary tract infection, site not specified: Secondary | ICD-10-CM | POA: Diagnosis present

## 2021-07-29 DIAGNOSIS — D638 Anemia in other chronic diseases classified elsewhere: Secondary | ICD-10-CM | POA: Diagnosis present

## 2021-07-29 DIAGNOSIS — L539 Erythematous condition, unspecified: Secondary | ICD-10-CM | POA: Diagnosis not present

## 2021-07-29 DIAGNOSIS — D696 Thrombocytopenia, unspecified: Secondary | ICD-10-CM | POA: Diagnosis not present

## 2021-07-29 DIAGNOSIS — R17 Unspecified jaundice: Secondary | ICD-10-CM | POA: Diagnosis not present

## 2021-07-29 DIAGNOSIS — E669 Obesity, unspecified: Secondary | ICD-10-CM | POA: Diagnosis present

## 2021-07-29 DIAGNOSIS — E876 Hypokalemia: Secondary | ICD-10-CM | POA: Diagnosis not present

## 2021-07-29 DIAGNOSIS — Z87891 Personal history of nicotine dependence: Secondary | ICD-10-CM | POA: Diagnosis not present

## 2021-07-29 DIAGNOSIS — R748 Abnormal levels of other serum enzymes: Secondary | ICD-10-CM

## 2021-07-29 DIAGNOSIS — Z743 Need for continuous supervision: Secondary | ICD-10-CM | POA: Diagnosis not present

## 2021-07-29 DIAGNOSIS — Z800 Family history of malignant neoplasm of digestive organs: Secondary | ICD-10-CM

## 2021-07-29 DIAGNOSIS — R609 Edema, unspecified: Secondary | ICD-10-CM

## 2021-07-29 DIAGNOSIS — E538 Deficiency of other specified B group vitamins: Secondary | ICD-10-CM | POA: Diagnosis not present

## 2021-07-29 DIAGNOSIS — E860 Dehydration: Secondary | ICD-10-CM | POA: Diagnosis not present

## 2021-07-29 DIAGNOSIS — E872 Acidosis, unspecified: Secondary | ICD-10-CM | POA: Diagnosis not present

## 2021-07-29 DIAGNOSIS — I471 Supraventricular tachycardia: Secondary | ICD-10-CM

## 2021-07-29 DIAGNOSIS — B961 Klebsiella pneumoniae [K. pneumoniae] as the cause of diseases classified elsewhere: Secondary | ICD-10-CM | POA: Diagnosis present

## 2021-07-29 DIAGNOSIS — Z79899 Other long term (current) drug therapy: Secondary | ICD-10-CM

## 2021-07-29 DIAGNOSIS — E559 Vitamin D deficiency, unspecified: Secondary | ICD-10-CM | POA: Diagnosis not present

## 2021-07-29 DIAGNOSIS — I959 Hypotension, unspecified: Secondary | ICD-10-CM | POA: Diagnosis not present

## 2021-07-29 DIAGNOSIS — D6959 Other secondary thrombocytopenia: Secondary | ICD-10-CM | POA: Diagnosis present

## 2021-07-29 DIAGNOSIS — R600 Localized edema: Secondary | ICD-10-CM | POA: Diagnosis present

## 2021-07-29 DIAGNOSIS — G8929 Other chronic pain: Secondary | ICD-10-CM | POA: Diagnosis present

## 2021-07-29 DIAGNOSIS — H1033 Unspecified acute conjunctivitis, bilateral: Secondary | ICD-10-CM | POA: Diagnosis not present

## 2021-07-29 DIAGNOSIS — H109 Unspecified conjunctivitis: Secondary | ICD-10-CM | POA: Diagnosis present

## 2021-07-29 DIAGNOSIS — R7989 Other specified abnormal findings of blood chemistry: Secondary | ICD-10-CM | POA: Diagnosis present

## 2021-07-29 DIAGNOSIS — Z6828 Body mass index (BMI) 28.0-28.9, adult: Secondary | ICD-10-CM

## 2021-07-29 DIAGNOSIS — R160 Hepatomegaly, not elsewhere classified: Secondary | ICD-10-CM | POA: Diagnosis not present

## 2021-07-29 DIAGNOSIS — Z8249 Family history of ischemic heart disease and other diseases of the circulatory system: Secondary | ICD-10-CM

## 2021-07-29 DIAGNOSIS — K529 Noninfective gastroenteritis and colitis, unspecified: Secondary | ICD-10-CM | POA: Diagnosis not present

## 2021-07-29 DIAGNOSIS — R6889 Other general symptoms and signs: Secondary | ICD-10-CM | POA: Diagnosis not present

## 2021-07-29 DIAGNOSIS — R9431 Abnormal electrocardiogram [ECG] [EKG]: Secondary | ICD-10-CM | POA: Diagnosis not present

## 2021-07-29 DIAGNOSIS — Z9181 History of falling: Secondary | ICD-10-CM | POA: Diagnosis not present

## 2021-07-29 LAB — COMPREHENSIVE METABOLIC PANEL
ALT: 23 U/L (ref 0–44)
AST: 44 U/L — ABNORMAL HIGH (ref 15–41)
Albumin: 2.2 g/dL — ABNORMAL LOW (ref 3.5–5.0)
Alkaline Phosphatase: 244 U/L — ABNORMAL HIGH (ref 38–126)
Anion gap: 13 (ref 5–15)
BUN: 8 mg/dL (ref 6–20)
CO2: 30 mmol/L (ref 22–32)
Calcium: 7.3 mg/dL — ABNORMAL LOW (ref 8.9–10.3)
Chloride: 93 mmol/L — ABNORMAL LOW (ref 98–111)
Creatinine, Ser: 0.73 mg/dL (ref 0.44–1.00)
GFR, Estimated: >60 mL/min (ref >60)
Glucose, Bld: 103 mg/dL — ABNORMAL HIGH (ref 70–99)
Potassium: 3 mmol/L — ABNORMAL LOW (ref 3.5–5.1)
Sodium: 136 mmol/L (ref 135–145)
Total Bilirubin: 5.1 mg/dL — ABNORMAL HIGH (ref 0.3–1.2)
Total Protein: 5.8 g/dL — ABNORMAL LOW (ref 6.5–8.1)

## 2021-07-29 LAB — CBC WITH DIFFERENTIAL/PLATELET
Abs Immature Granulocytes: 0.07 10*3/uL (ref 0.00–0.07)
Basophils Absolute: 0 10*3/uL (ref 0.0–0.1)
Basophils Relative: 0 %
Eosinophils Absolute: 0 10*3/uL (ref 0.0–0.5)
Eosinophils Relative: 0 %
HCT: 20.5 % — ABNORMAL LOW (ref 36.0–46.0)
Hemoglobin: 6.9 g/dL — CL (ref 12.0–15.0)
Immature Granulocytes: 1 %
Lymphocytes Relative: 8 %
Lymphs Abs: 0.9 10*3/uL (ref 0.7–4.0)
MCH: 37.3 pg — ABNORMAL HIGH (ref 26.0–34.0)
MCHC: 33.7 g/dL (ref 30.0–36.0)
MCV: 110.8 fL — ABNORMAL HIGH (ref 80.0–100.0)
Monocytes Absolute: 0.8 10*3/uL (ref 0.1–1.0)
Monocytes Relative: 7 %
Neutro Abs: 9.4 10*3/uL — ABNORMAL HIGH (ref 1.7–7.7)
Neutrophils Relative %: 84 %
Platelets: 143 10*3/uL — ABNORMAL LOW (ref 150–400)
RBC: 1.85 MIL/uL — ABNORMAL LOW (ref 3.87–5.11)
RDW: 21 % — ABNORMAL HIGH (ref 11.5–15.5)
WBC: 11.1 10*3/uL — ABNORMAL HIGH (ref 4.0–10.5)
nRBC: 0 % (ref 0.0–0.2)

## 2021-07-29 LAB — TROPONIN I (HIGH SENSITIVITY): Troponin I (High Sensitivity): 18 ng/L — ABNORMAL HIGH (ref <18)

## 2021-07-29 LAB — URINALYSIS, ROUTINE W REFLEX MICROSCOPIC
Bilirubin Urine: NEGATIVE
Glucose, UA: NEGATIVE mg/dL
Ketones, ur: NEGATIVE mg/dL
Nitrite: NEGATIVE
Protein, ur: NEGATIVE mg/dL
Specific Gravity, Urine: 1.009 (ref 1.005–1.030)
WBC, UA: >50 WBC/hpf — ABNORMAL HIGH (ref 0–5)
pH: 7 (ref 5.0–8.0)

## 2021-07-29 LAB — PREPARE RBC (CROSSMATCH)

## 2021-07-29 LAB — BRAIN NATRIURETIC PEPTIDE: B Natriuretic Peptide: 128.3 pg/mL — ABNORMAL HIGH (ref 0.0–100.0)

## 2021-07-29 MED ORDER — SODIUM CHLORIDE 0.9 % IV SOLN
10.0000 mL/h | Freq: Once | INTRAVENOUS | Status: AC
  Administered 2021-07-30: 10 mL/h via INTRAVENOUS

## 2021-07-29 MED ORDER — POTASSIUM CHLORIDE CRYS ER 20 MEQ PO TBCR
40.0000 meq | EXTENDED_RELEASE_TABLET | Freq: Once | ORAL | Status: AC
Start: 2021-07-29 — End: 2021-07-29
  Administered 2021-07-29: 40 meq via ORAL
  Filled 2021-07-29: qty 2

## 2021-07-29 NOTE — ED Triage Notes (Signed)
Pt via [facility] for eval of edema. Home health nurse advised that heart "did not sound good," stated that she auscultated x1hr. Slight wheezing in upper left, otherwise clear. Per EMS, pt took albuterol PTA. Pt w no complaints on arrival.  ? ?VSS, NAD ?

## 2021-07-29 NOTE — ED Notes (Signed)
IV team at bedside 

## 2021-07-29 NOTE — ED Notes (Signed)
Pt had episode of SVT lasting approx 2 minutes. RN in room during time of episode. Pt was in no distress and not c/o chest pain during event. MD notified and at bedside immediately. EKG done during and after.  ?

## 2021-07-29 NOTE — ED Provider Notes (Signed)
?[facility] ?Provider Note ? ? ?CSN: [id number] ?Arrival date & time: 07/29/21  1555 ? ?  ? ?History ? ?Chief Complaint  ?Patient presents with  ? Edema  ? ? ?Laura [name] is a 61 y.o. adult. ? ?The history is provided by the patient and medical records. No language interpreter was used.  ?Shortness of Breath ?Severity:  Severe ?Onset quality:  Gradual ?Duration:  3 days ?Timing:  Intermittent ?Progression:  Waxing and waning ?Chronicity:  Recurrent ?Context: not URI   ?Relieved by:  Rest ?Worsened by:  Exertion ?Ineffective treatments:  None tried ?Associated symptoms: no chest pain, no cough, no diaphoresis, no fever, no headaches, no hemoptysis, no neck pain, no rash, no sputum production, no swollen glands, no vomiting and no wheezing   ?Risk factors: no hx of PE/DVT   ? ?  ? ?Home Medications ?Prior to Admission medications   ?Medication Sig Start Date End Date Taking? Authorizing Provider  ?albuterol (VENTOLIN HFA) 108 (90 Base) MCG/ACT inhaler INHALE 2 PUFFS INTO THE LUNGS EVERY 6 HOURS AS NEEDED FOR WHEEZING OR SHORTNESS OF BREATH ?Patient taking differently: Inhale 2 puffs into the lungs every 6 (six) hours as needed for wheezing or shortness of breath. 05/19/19   Lauree Chandler, NP  ?calcium carbonate (OSCAL) 1500 (600 Ca) MG TABS tablet Take 600 mg of elemental calcium by mouth 3 (three) times daily.    [provider]  ?diphenhydrAMINE (BENADRYL) 25 MG tablet Take 25 mg by mouth every 6 (six) hours as needed for allergies.    [provider]  ?folic acid (FOLVITE) 1 MG tablet Take 2 tablets (2 mg total) by mouth daily. 02/08/20   Lauree Chandler, NP  ?furosemide (LASIX) 20 MG tablet Take 1 tablet (20 mg total) by mouth daily for 7 days. 07/20/21 07/27/21  Luna Fuse, MD  ?oxyCODONE-acetaminophen (PERCOCET/ROXICET) 5-325 MG tablet Take one tablet by mouth every 8-12 hours as needed for pain. 07/24/21   Lauree Chandler, NP  ?potassium chloride SA  (KLOR-CON M) 20 MEQ tablet Take 1 tablet (20 mEq total) by mouth daily for 7 days. 07/20/21 07/27/21  Luna Fuse, MD  ?predniSONE (STERAPRED UNI-PAK 21 TAB) 10 MG (21) TBPK tablet Use as directed ?Patient taking differently: Take 10 mg by mouth See admin instructions. [id number] 07/07/21   Lauree Chandler, NP  ?Tetrahydrozoline HCl (VISINE OP) Place 1 drop into both eyes daily as needed (For dry eyes).    [provider]  ?   ? ?Allergies    ?Garlic, Aspirin, Lyrica [pregabalin], and Penicillins   ? ?Review of Systems   ?Review of Systems  ?Constitutional:  Positive for fatigue. Negative for chills, diaphoresis and fever.  ?HENT:  Negative for congestion.   ?Eyes:  Negative for visual disturbance.  ?Respiratory:  Positive for chest tightness and shortness of breath. Negative for cough, hemoptysis, sputum production, wheezing and stridor.   ?Cardiovascular:  Positive for leg swelling. Negative for chest pain and palpitations.  ?Gastrointestinal:  Negative for constipation, diarrhea, nausea and vomiting.  ?Genitourinary:  Negative for dysuria and frequency.  ?Musculoskeletal:  Negative for back pain, neck pain and neck stiffness.  ?Skin:  Negative for rash and wound.  ?Neurological:  Negative for weakness, light-headedness, numbness and headaches.  ?Psychiatric/Behavioral:  Negative for agitation and confusion.   ?All other systems reviewed and are negative. ? ?Physical Exam ?Updated Vital Signs ?BP 95/67 (BP Location: Left Arm)   Pulse (!) 104  Temp 98.6 ?F (37 ?C) (Oral)   Resp 16   SpO2 100%  ?Physical Exam ?Vitals and nursing note reviewed.  ?Constitutional:   ?   General: He is not in acute distress. ?   Appearance: He is well-developed. He is not ill-appearing, toxic-appearing or diaphoretic.  ?HENT:  ?   Head: Normocephalic and atraumatic.  ?   Right Ear: External ear normal.  ?   Left Ear: External ear normal.  ?   Nose: Nose normal. No congestion or rhinorrhea.  ?   Mouth/Throat:  ?   Mouth:  Mucous membranes are moist.  ?   Pharynx: No oropharyngeal exudate.  ?Eyes:  ?   Conjunctiva/sclera: Conjunctivae normal.  ?   Pupils: Pupils are equal, round, and reactive to light.  ?Cardiovascular:  ?   Rate and Rhythm: Tachycardia present.  ?   Heart sounds: No murmur heard. ?Pulmonary:  ?   Effort: No respiratory distress.  ?   Breath sounds: No stridor. Rales present. No wheezing or rhonchi.  ?Chest:  ?   Chest wall: No tenderness.  ?Abdominal:  ?   General: Abdomen is flat. There is no distension.  ?   Tenderness: There is no abdominal tenderness. There is no right CVA tenderness, left CVA tenderness, guarding or rebound.  ?Musculoskeletal:     ?   General: No tenderness.  ?   Cervical back: Normal range of motion and neck supple. No tenderness.  ?   Right lower leg: Edema present.  ?   Left lower leg: Edema present.  ?Skin: ?   General: Skin is warm.  ?   Capillary Refill: Capillary refill takes less than 2 seconds.  ?   Findings: No erythema, lesion or rash.  ?Neurological:  ?   General: No focal deficit present.  ?   Mental Status: He is alert and oriented to person, place, and time.  ?   Sensory: No sensory deficit.  ?   Motor: No weakness or abnormal muscle tone.  ?   Deep Tendon Reflexes: Reflexes are normal and symmetric.  ?Psychiatric:     ?   Mood and Affect: Mood normal.  ? ? ?ED Results / Procedures / Treatments   ?Labs ?(all labs ordered are listed, but only abnormal results are displayed) ?Labs Reviewed  ?COMPREHENSIVE METABOLIC PANEL - Abnormal; Notable for the following components:  ?    Result Value  ? Potassium 3.0 (*)   ? Chloride 93 (*)   ? Glucose, Bld 103 (*)   ? Calcium 7.3 (*)   ? Total Protein 5.8 (*)   ? Albumin 2.2 (*)   ? AST 44 (*)   ? Alkaline Phosphatase 244 (*)   ? Total Bilirubin 5.1 (*)   ? All other components within normal limits  ?CBC WITH DIFFERENTIAL/PLATELET - Abnormal; Notable for the following components:  ? WBC 11.1 (*)   ? RBC 1.85 (*)   ? Hemoglobin 6.9 (*)   ? HCT  20.5 (*)   ? MCV 110.8 (*)   ? MCH 37.3 (*)   ? RDW 21.0 (*)   ? Platelets 143 (*)   ? Neutro Abs 9.4 (*)   ? All other components within normal limits  ?BRAIN NATRIURETIC PEPTIDE - Abnormal; Notable for the following components:  ? B Natriuretic Peptide 128.3 (*)   ? All other components within normal limits  ?URINALYSIS, ROUTINE W REFLEX MICROSCOPIC - Abnormal; Notable for the following components:  ? Color, Urine  AMBER (*)   ? APPearance CLOUDY (*)   ? Hgb urine dipstick SMALL (*)   ? Leukocytes,Ua MODERATE (*)   ? WBC, UA >50 (*)   ? Bacteria, UA MANY (*)   ? Non Squamous Epithelial 0-5 (*)   ? All other components within normal limits  ?TROPONIN I (HIGH SENSITIVITY) - Abnormal; Notable for the following components:  ? Troponin I (High Sensitivity) 18 (*)   ? All other components within normal limits  ?URINE CULTURE  ?TYPE AND SCREEN  ?PREPARE RBC (CROSSMATCH)  ? ? ?EKG ?EKG Interpretation ? ?Date/Time:  Tuesday July 29 2021 20:39:26 EDT ?Ventricular Rate:  97 ?PR Interval:  130 ?QRS Duration: 76 ?QT Interval:  319 ?QTC Calculation: 406 ?R Axis:   43 ?Text Interpretation: Sinus rhythm Abnormal R-wave progression, early transition Borderline repolarization abnormality when compared to prior, now sinus appearing. No STEMI Confirmed by Antony Blackbird [phone]) on 07/29/2021 10:46:18 PM ? ?Radiology ?DG Chest 2 View ? ?Result Date: 07/29/2021 ?CLINICAL DATA:  Edema. EXAM: CHEST - 2 VIEW COMPARISON:  07/20/2021 FINDINGS: Lower lung volumes from prior exam. Minor bibasilar atelectasis. Stable heart size and mediastinal contours. No pulmonary edema. No confluent consolidation. No pleural effusion. No pneumothorax. No acute osseous findings. IMPRESSION: Low lung volumes with minor bibasilar atelectasis. Electronically Signed   By: Keith Rake M.D.   On: 07/29/2021 18:35   ? ?Procedures ?Procedures  ? ? ?Medications Ordered in ED ?Medications - No data to display ? ?ED Course/ Medical Decision Making/ A&P ?  ?                         ?Medical Decision Making ?Amount and/or Complexity of Data Reviewed ?Labs: ordered. ?Radiology: ordered. ? ?Risk ?Prescription drug management. ?Decision regarding hospitalization. ? ? ? ?

## 2021-07-29 NOTE — Telephone Encounter (Signed)
Laura [name] with [facility] called and stated that she just wanted to let you know she sent patient to the ER due to Heart Rate 170's and 2+ Edema and lungs clear.  ? ?FYI ?

## 2021-07-30 ENCOUNTER — Observation Stay (HOSPITAL_COMMUNITY): Payer: Medicare Other

## 2021-07-30 DIAGNOSIS — R609 Edema, unspecified: Secondary | ICD-10-CM

## 2021-07-30 DIAGNOSIS — E861 Hypovolemia: Secondary | ICD-10-CM | POA: Diagnosis not present

## 2021-07-30 DIAGNOSIS — D638 Anemia in other chronic diseases classified elsewhere: Secondary | ICD-10-CM | POA: Diagnosis present

## 2021-07-30 DIAGNOSIS — R160 Hepatomegaly, not elsewhere classified: Secondary | ICD-10-CM | POA: Diagnosis not present

## 2021-07-30 DIAGNOSIS — R9431 Abnormal electrocardiogram [ECG] [EKG]: Secondary | ICD-10-CM

## 2021-07-30 DIAGNOSIS — E538 Deficiency of other specified B group vitamins: Secondary | ICD-10-CM | POA: Diagnosis present

## 2021-07-30 DIAGNOSIS — D649 Anemia, unspecified: Secondary | ICD-10-CM | POA: Diagnosis not present

## 2021-07-30 DIAGNOSIS — G8929 Other chronic pain: Secondary | ICD-10-CM | POA: Diagnosis present

## 2021-07-30 DIAGNOSIS — N390 Urinary tract infection, site not specified: Secondary | ICD-10-CM | POA: Diagnosis present

## 2021-07-30 DIAGNOSIS — R748 Abnormal levels of other serum enzymes: Secondary | ICD-10-CM | POA: Diagnosis not present

## 2021-07-30 DIAGNOSIS — B961 Klebsiella pneumoniae [K. pneumoniae] as the cause of diseases classified elsewhere: Secondary | ICD-10-CM | POA: Diagnosis present

## 2021-07-30 DIAGNOSIS — I471 Supraventricular tachycardia: Secondary | ICD-10-CM

## 2021-07-30 DIAGNOSIS — R600 Localized edema: Secondary | ICD-10-CM | POA: Diagnosis present

## 2021-07-30 DIAGNOSIS — F17210 Nicotine dependence, cigarettes, uncomplicated: Secondary | ICD-10-CM | POA: Diagnosis present

## 2021-07-30 DIAGNOSIS — E876 Hypokalemia: Secondary | ICD-10-CM

## 2021-07-30 DIAGNOSIS — R0602 Shortness of breath: Secondary | ICD-10-CM | POA: Insufficient documentation

## 2021-07-30 DIAGNOSIS — R188 Other ascites: Secondary | ICD-10-CM | POA: Diagnosis not present

## 2021-07-30 DIAGNOSIS — L304 Erythema intertrigo: Secondary | ICD-10-CM | POA: Diagnosis present

## 2021-07-30 DIAGNOSIS — R17 Unspecified jaundice: Secondary | ICD-10-CM | POA: Diagnosis not present

## 2021-07-30 DIAGNOSIS — I959 Hypotension, unspecified: Secondary | ICD-10-CM | POA: Diagnosis present

## 2021-07-30 DIAGNOSIS — L539 Erythematous condition, unspecified: Secondary | ICD-10-CM | POA: Diagnosis not present

## 2021-07-30 DIAGNOSIS — M545 Low back pain, unspecified: Secondary | ICD-10-CM | POA: Diagnosis present

## 2021-07-30 DIAGNOSIS — R7989 Other specified abnormal findings of blood chemistry: Secondary | ICD-10-CM | POA: Diagnosis present

## 2021-07-30 DIAGNOSIS — G4700 Insomnia, unspecified: Secondary | ICD-10-CM | POA: Diagnosis present

## 2021-07-30 DIAGNOSIS — Z8261 Family history of arthritis: Secondary | ICD-10-CM | POA: Diagnosis not present

## 2021-07-30 DIAGNOSIS — Z6828 Body mass index (BMI) 28.0-28.9, adult: Secondary | ICD-10-CM | POA: Diagnosis not present

## 2021-07-30 DIAGNOSIS — I10 Essential (primary) hypertension: Secondary | ICD-10-CM | POA: Diagnosis present

## 2021-07-30 DIAGNOSIS — H1089 Other conjunctivitis: Secondary | ICD-10-CM | POA: Diagnosis present

## 2021-07-30 DIAGNOSIS — H1033 Unspecified acute conjunctivitis, bilateral: Secondary | ICD-10-CM | POA: Diagnosis not present

## 2021-07-30 DIAGNOSIS — R531 Weakness: Secondary | ICD-10-CM | POA: Diagnosis not present

## 2021-07-30 DIAGNOSIS — I9589 Other hypotension: Secondary | ICD-10-CM

## 2021-07-30 DIAGNOSIS — E559 Vitamin D deficiency, unspecified: Secondary | ICD-10-CM | POA: Diagnosis present

## 2021-07-30 DIAGNOSIS — E669 Obesity, unspecified: Secondary | ICD-10-CM | POA: Diagnosis present

## 2021-07-30 DIAGNOSIS — D6959 Other secondary thrombocytopenia: Secondary | ICD-10-CM | POA: Diagnosis present

## 2021-07-30 DIAGNOSIS — K760 Fatty (change of) liver, not elsewhere classified: Secondary | ICD-10-CM | POA: Diagnosis not present

## 2021-07-30 DIAGNOSIS — B964 Proteus (mirabilis) (morganii) as the cause of diseases classified elsewhere: Secondary | ICD-10-CM | POA: Diagnosis present

## 2021-07-30 DIAGNOSIS — E8809 Other disorders of plasma-protein metabolism, not elsewhere classified: Secondary | ICD-10-CM | POA: Diagnosis present

## 2021-07-30 LAB — CBC
HCT: 24.1 % — ABNORMAL LOW (ref 36.0–46.0)
Hemoglobin: 7.9 g/dL — ABNORMAL LOW (ref 12.0–15.0)
MCH: 35 pg — ABNORMAL HIGH (ref 26.0–34.0)
MCHC: 32.8 g/dL (ref 30.0–36.0)
MCV: 106.6 fL — ABNORMAL HIGH (ref 80.0–100.0)
Platelets: 108 10*3/uL — ABNORMAL LOW (ref 150–400)
RBC: 2.26 MIL/uL — ABNORMAL LOW (ref 3.87–5.11)
RDW: 21.2 % — ABNORMAL HIGH (ref 11.5–15.5)
WBC: 10.4 10*3/uL (ref 4.0–10.5)
nRBC: 0 % (ref 0.0–0.2)

## 2021-07-30 LAB — BASIC METABOLIC PANEL
Anion gap: 11 (ref 5–15)
BUN: 9 mg/dL (ref 6–20)
CO2: 29 mmol/L (ref 22–32)
Calcium: 7.4 mg/dL — ABNORMAL LOW (ref 8.9–10.3)
Chloride: 95 mmol/L — ABNORMAL LOW (ref 98–111)
Creatinine, Ser: 0.69 mg/dL (ref 0.44–1.00)
GFR, Estimated: >60 mL/min (ref >60)
Glucose, Bld: 93 mg/dL (ref 70–99)
Potassium: 3.2 mmol/L — ABNORMAL LOW (ref 3.5–5.1)
Sodium: 135 mmol/L (ref 135–145)

## 2021-07-30 LAB — HEMOGLOBIN AND HEMATOCRIT, BLOOD
HCT: 21.8 % — ABNORMAL LOW (ref 36.0–46.0)
Hemoglobin: 7.6 g/dL — ABNORMAL LOW (ref 12.0–15.0)

## 2021-07-30 LAB — PROTIME-INR
INR: 1.2 (ref 0.8–1.2)
Prothrombin Time: 15 seconds (ref 11.4–15.2)

## 2021-07-30 LAB — HEPATIC FUNCTION PANEL
ALT: 26 U/L (ref 0–44)
AST: 60 U/L — ABNORMAL HIGH (ref 15–41)
Albumin: 2.3 g/dL — ABNORMAL LOW (ref 3.5–5.0)
Alkaline Phosphatase: 253 U/L — ABNORMAL HIGH (ref 38–126)
Bilirubin, Direct: 4.5 mg/dL — ABNORMAL HIGH (ref 0.0–0.2)
Indirect Bilirubin: 3.5 mg/dL — ABNORMAL HIGH (ref 0.3–0.9)
Total Bilirubin: 8 mg/dL — ABNORMAL HIGH (ref 0.3–1.2)
Total Protein: 6.3 g/dL — ABNORMAL LOW (ref 6.5–8.1)

## 2021-07-30 LAB — RETICULOCYTES
Immature Retic Fract: 16.7 % — ABNORMAL HIGH (ref 2.3–15.9)
RBC.: 2.08 MIL/uL — ABNORMAL LOW (ref 3.87–5.11)
Retic Count, Absolute: 37.9 10*3/uL (ref 19.0–186.0)
Retic Ct Pct: 1.8 % (ref 0.4–3.1)

## 2021-07-30 LAB — LACTATE DEHYDROGENASE: LDH: 219 U/L — ABNORMAL HIGH (ref 98–192)

## 2021-07-30 LAB — POTASSIUM: Potassium: 3.5 mmol/L (ref 3.5–5.1)

## 2021-07-30 LAB — IRON AND TIBC
Iron: 114 ug/dL (ref 28–170)
Saturation Ratios: 93 % — ABNORMAL HIGH (ref 10.4–31.8)
TIBC: 123 ug/dL — ABNORMAL LOW (ref 250–450)
UIBC: 9 ug/dL

## 2021-07-30 LAB — ECHOCARDIOGRAM COMPLETE
AR max vel: 2.65 cm2
AV Peak grad: 5.9 mmHg
Ao pk vel: 1.21 m/s
Area-P 1/2: 4.15 cm2
Calc EF: 58.7 %
S' Lateral: 2.9 cm
Single Plane A2C EF: 60.4 %
Single Plane A4C EF: 57.4 %

## 2021-07-30 LAB — GAMMA GT: GGT: 167 U/L — ABNORMAL HIGH (ref 7–50)

## 2021-07-30 LAB — BILIRUBIN, FRACTIONATED(TOT/DIR/INDIR)
Bilirubin, Direct: 3.5 mg/dL — ABNORMAL HIGH (ref 0.0–0.2)
Indirect Bilirubin: 3 mg/dL — ABNORMAL HIGH (ref 0.3–0.9)
Total Bilirubin: 6.5 mg/dL — ABNORMAL HIGH (ref 0.3–1.2)

## 2021-07-30 LAB — FOLATE: Folate: 2.8 ng/mL — ABNORMAL LOW (ref >5.9)

## 2021-07-30 LAB — HEPATITIS B SURFACE ANTIGEN: Hepatitis B Surface Ag: NONREACTIVE

## 2021-07-30 LAB — SAVE SMEAR(SSMR), FOR PROVIDER SLIDE REVIEW

## 2021-07-30 LAB — HEPATITIS A ANTIBODY, TOTAL: hep A Total Ab: NONREACTIVE

## 2021-07-30 LAB — HEPATITIS C ANTIBODY: HCV Ab: NONREACTIVE

## 2021-07-30 LAB — MAGNESIUM
Magnesium: 0.9 mg/dL — CL (ref 1.7–2.4)
Magnesium: 1.2 mg/dL — ABNORMAL LOW (ref 1.7–2.4)

## 2021-07-30 LAB — FERRITIN: Ferritin: 1244 ng/mL — ABNORMAL HIGH (ref 11–307)

## 2021-07-30 LAB — HEPATITIS B CORE ANTIBODY, TOTAL: Hep B Core Total Ab: NONREACTIVE

## 2021-07-30 LAB — TROPONIN I (HIGH SENSITIVITY): Troponin I (High Sensitivity): 12 ng/L (ref <18)

## 2021-07-30 LAB — VITAMIN B12: Vitamin B-12: 930 pg/mL — ABNORMAL HIGH (ref 180–914)

## 2021-07-30 IMAGING — MR MR MRCP
10 of 15 series · 38 of 48 positions shown · non-contrast
Comparison: No prior abdominal MRI. CT the abdomen and pelvis
[DATE].

CLINICAL DATA: 60-year-old female with history of fatty liver.
Elevated total bilirubin alkaline phosphatase, concerning for
potential biliary obstruction. Increasing shortness of breath and
edema.

EXAM:
MRI ABDOMEN WITHOUT CONTRAST  (INCLUDING MRCP)
TECHNIQUE: Multiplanar multisequence MR imaging of the abdomen was performed.
Heavily T2-weighted images of the biliary and pancreatic ducts were
obtained, and three-dimensional MRCP images were rendered by post
processing.

[Series 4: ax haste · axial · 6.0mm · 1.22mm/px · z∈[-199,+118]mm · 3 of 45 slices shown]
[im 1/45]
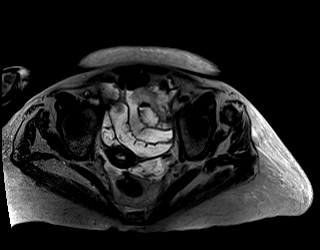
[im 23/45]
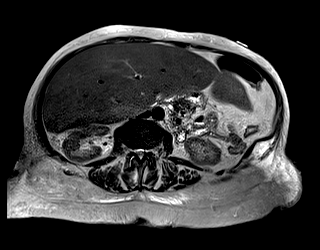
[im 45/45]
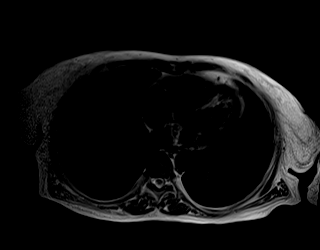

[Series 6: cor haste · coronal · 6.0mm · 1.22mm/px · 2 of 38 slices shown]
[im 1/38]
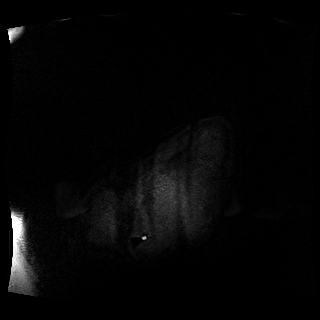
[im 38/38]
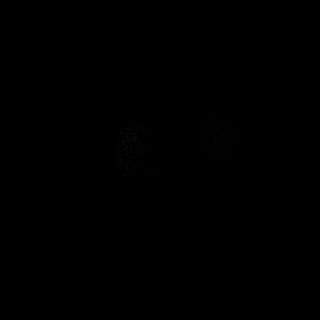

[Series 8: T2 fat-sat · axial · 6.0mm · 1.22mm/px · z∈[-188,+121]mm · 2 of 44 slices shown]
[im 1/44]
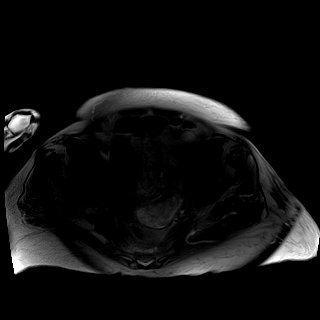
[im 44/44]
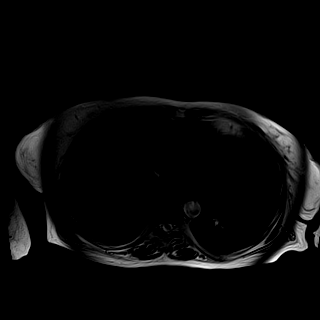

[Series 10: DWI · axial · 6.0mm · 1.49mm/px · z∈[-192,+147]mm · 8 of 144 slices shown (1 of 2)]
[im 1/144]
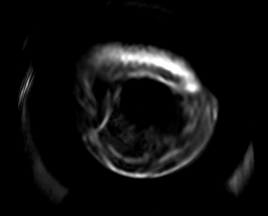
[im 21/144]
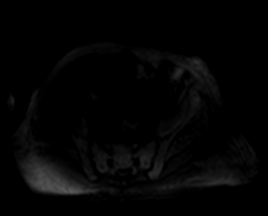
[im 41/144]
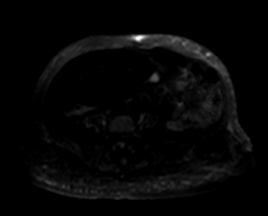
[im 62/144]
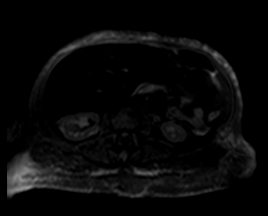
[im 82/144]
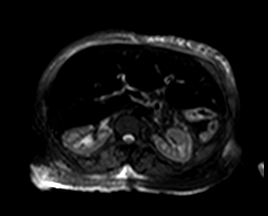
[im 103/144]
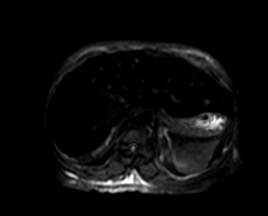
[im 123/144]
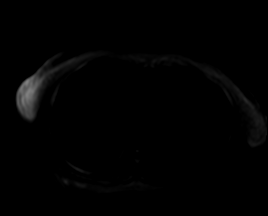
[im 144/144]
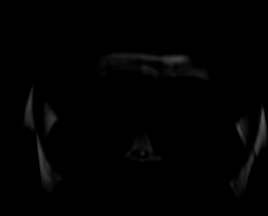

[Series 11: DWI · axial · 6.0mm · 1.49mm/px · z∈[-192,+147]mm · 3 of 48 slices shown (2 of 2)]
[im 1/48]
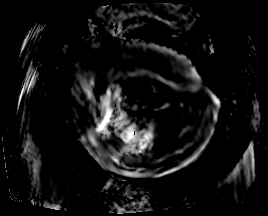
[im 24/48]
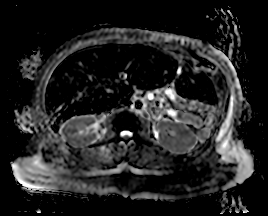
[im 48/48]
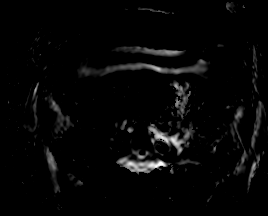

[Series 12: ax in and · axial · 3.0mm · 1.22mm/px · z∈[-205,+104]mm · 6 of 104 slices shown (1 of 2)]
[im 1/104]
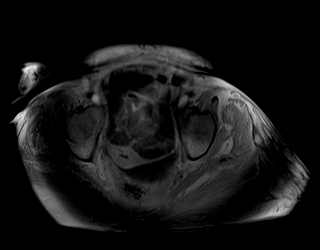
[im 21/104]
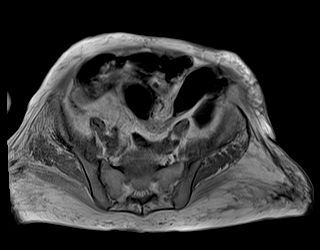
[im 42/104]
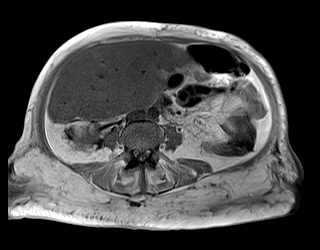
[im 62/104]
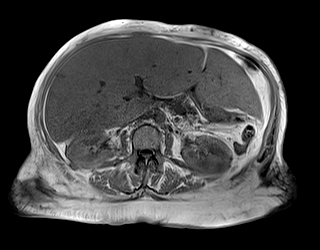
[im 83/104]
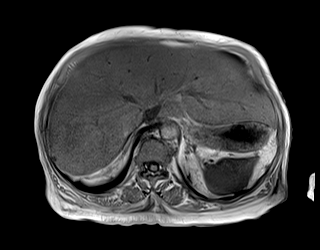
[im 104/104]
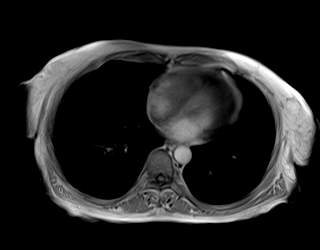

[Series 12: ax in and · axial · 3.0mm · 1.22mm/px · z∈[-205,+104]mm · 6 of 104 slices shown (2 of 2)]
[im 1/104]
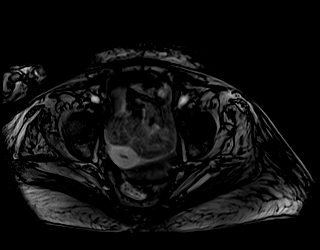
[im 21/104]
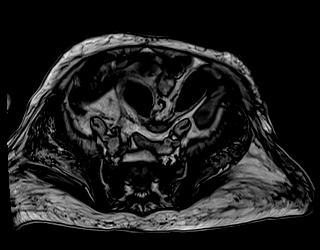
[im 42/104]
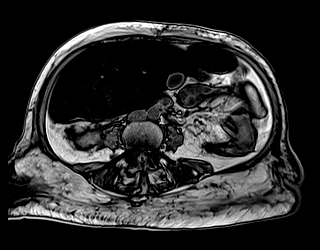
[im 62/104]
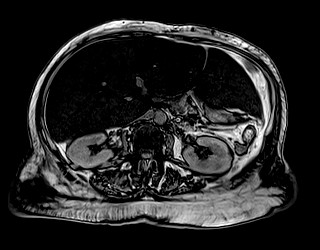
[im 83/104]
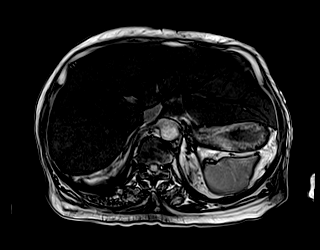
[im 104/104]
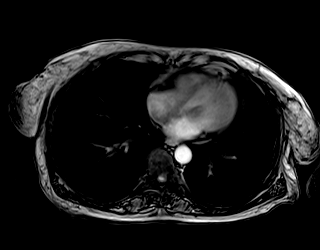

[Series 17: MRCP · coronal · 4.0mm · 1.19mm/px · 1 of 24 slices shown]
[im 1/24]
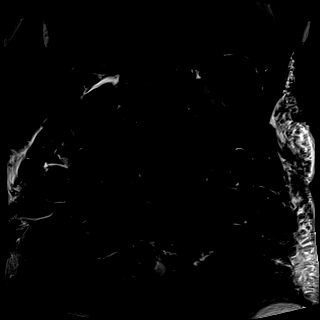

[Series 19: T1 dynamic · axial · non-contrast · 3.0mm · 1.19mm/px · z∈[-205,+104]mm · 6 of 104 slices shown]
[im 1/104]
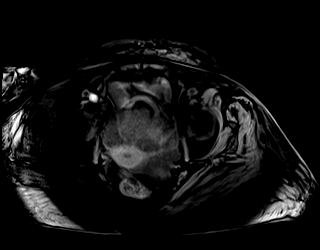
[im 21/104]
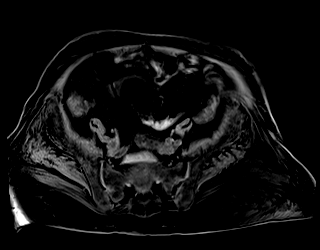
[im 42/104]
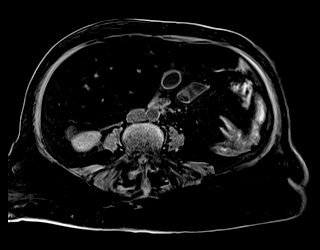
[im 62/104]
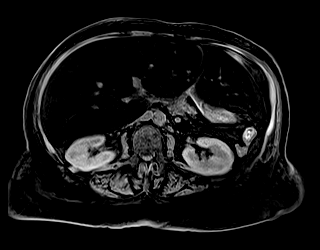
[im 83/104]
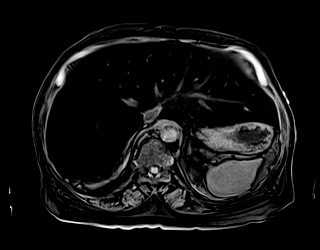
[im 104/104]
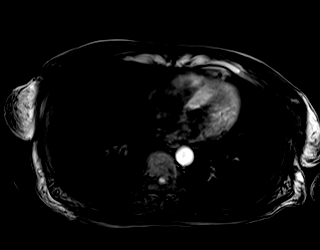

[Series 20: radials · coronal · 50.0mm · 0.89mm/px · 1 of 5 slices shown]
[im 1/5]
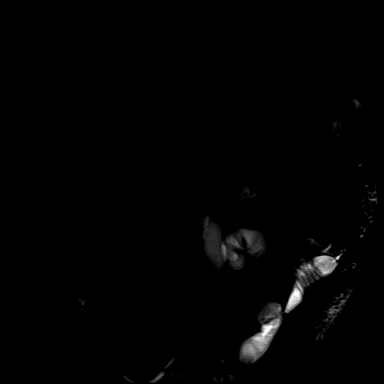

[38 of 48 positions shown; findings below may reference images not displayed]

FINDINGS: Comment: Today's study is limited for detection and characterization
of visceral and/or vascular lesions by lack of IV gadolinium.

Lower chest: Unremarkable.

Hepatobiliary: Liver is enlarged measuring 23.6 cm in craniocaudal
span. Severe diffuse loss of signal intensity throughout the hepatic
parenchyma on out of phase dual echo images, indicative of severe
hepatic steatosis. No discrete cystic or solid hepatic lesions are
confidently identified on today's noncontrast examination. No intra
or extrahepatic biliary ductal dilatation is noted on MRCP images.
Common bile duct measures 6 mm in the porta hepatis. No filling
defects within the common bile duct to suggest choledocholithiasis.
Gallbladder is normal in appearance.

Pancreas: No definite pancreatic mass noted on today's noncontrast
no pancreatic ductal dilatation. No pancreatic or peripancreatic
fluid collections or inflammatory changes.

Spleen:  Unremarkable.

Adrenals/Urinary Tract: Bilateral kidneys and bilateral adrenal
glands are normal in appearance on today's noncontrast examination.

Stomach/Bowel: Examination.  Visualized portions are unremarkable.

Vascular/Lymphatic: No aneurysm identified in the visualized
abdominal vasculature. No lymphadenopathy noted in the abdomen.

Other:  Trace volume of ascites.

Musculoskeletal: No aggressive appearing osseous lesions are noted
in the visualized portions of the skeleton.
IMPRESSION: 1. No findings to suggest biliary tract obstruction.
2. Severe hepatomegaly with severe hepatic steatosis.
3. Trace volume of ascites.

## 2021-07-30 MED ORDER — SODIUM CHLORIDE 0.9 % IV SOLN: INTRAVENOUS | Status: DC | PRN

## 2021-07-30 MED ORDER — ALBUMIN HUMAN 25 % IV SOLN
25.0000 g | Freq: Two times a day (BID) | INTRAVENOUS | Status: AC
  Administered 2021-07-31 – 2021-08-01 (×3): 25 g via INTRAVENOUS
  Filled 2021-07-30 (×3): qty 100

## 2021-07-30 MED ORDER — OXYCODONE-ACETAMINOPHEN 5-325 MG PO TABS
1.0000 | ORAL_TABLET | Freq: Three times a day (TID) | ORAL | Status: DC | PRN
  Administered 2021-07-30 – 2021-08-04 (×10): 1 via ORAL
  Filled 2021-07-30 (×10): qty 1

## 2021-07-30 MED ORDER — FOLIC ACID 5 MG/ML IJ SOLN
1.0000 mg | Freq: Every day | INTRAMUSCULAR | Status: DC
  Administered 2021-07-31 – 2021-08-01 (×2): 1 mg via INTRAVENOUS
  Filled 2021-07-30 (×7): qty 0.2

## 2021-07-30 MED ORDER — SODIUM CHLORIDE 0.9 % IV SOLN: 1.0000 mg | Freq: Every day | INTRAVENOUS | Status: DC

## 2021-07-30 MED ORDER — POTASSIUM CHLORIDE CRYS ER 20 MEQ PO TBCR
40.0000 meq | EXTENDED_RELEASE_TABLET | ORAL | Status: AC
  Administered 2021-07-30: 40 meq via ORAL
  Filled 2021-07-30 (×2): qty 2

## 2021-07-30 MED ORDER — CIPROFLOXACIN IN D5W 400 MG/200ML IV SOLN
400.0000 mg | Freq: Two times a day (BID) | INTRAVENOUS | Status: DC
  Administered 2021-07-30 – 2021-08-01 (×4): 400 mg via INTRAVENOUS
  Filled 2021-07-30 (×6): qty 200

## 2021-07-30 MED ORDER — CALCIUM GLUCONATE-NACL 1-0.675 GM/50ML-% IV SOLN
1.0000 g | Freq: Once | INTRAVENOUS | Status: AC
  Administered 2021-07-30: 1000 mg via INTRAVENOUS
  Filled 2021-07-30: qty 50

## 2021-07-30 MED ORDER — MAGNESIUM SULFATE 4 GM/100ML IV SOLN
4.0000 g | Freq: Once | INTRAVENOUS | Status: AC
  Administered 2021-07-30: 4 g via INTRAVENOUS
  Filled 2021-07-30: qty 100

## 2021-07-30 MED ORDER — MAGNESIUM SULFATE 2 GM/50ML IV SOLN
2.0000 g | Freq: Once | INTRAVENOUS | Status: AC
  Administered 2021-07-30: 2 g via INTRAVENOUS
  Filled 2021-07-30: qty 50

## 2021-07-30 NOTE — Assessment & Plan Note (Addendum)
Mg of 0.9 on admission. ?-Magnesium repleted. ?-Status post IV magnesium.  ?-Patient started on oral magnesium supplementation 08/02/2021.  ?-Outpatient follow-up with PCP. ?

## 2021-07-30 NOTE — Assessment & Plan Note (Addendum)
Corrected Ca of 9.64. ?-Repleted with IV Calcium gluconate.  ?-Patient with vitamin D deficiency and placed on oral vitamin D supplementation.  ?-Repeat labs in the AM. ?

## 2021-07-30 NOTE — Consult Note (Addendum)
? ?Referring Provider: Dr. Irine Seal  ?Primary Care Physician:  Lauree Chandler, NP ?Primary Gastroenterologist:  Althia Forts  ? ?Reason for Consultation: Elevated LFTs ? ?HPI: Laura [name] is a 61 y.o. adult with a past medical history of hypertension, chronic lower back pain, anemia, chronic diarrhea and hepatic steatosis with hepatomegaly per CT,. ? ?She was admitted to the hospital 06/30/2021 due to having diarrhea for the 6 to 8 months.  She was found to be anemic with a hemoglobin level of 7.8 which dropped to 5.8.  Iron 114.  TIBC 111.  B12 level 713.  Folate 2.5.  FOBT negative.  Transfused 2 units of PRBCs -> Hg 8.3.  A GI consult was completed by Dr. Collene Mares.  She underwent an EGD and colonoscopy 07/01/2021.  The EGD showed a normal esophagus and stomach, duodenal biopsies showed fragments of small bowel mucosa with partial blunting of villi without evidence of intraepithelial lymphocytosis which was not diagnostic for celiac disease.  Celiac serology was negative.  The colonoscopy was normal without evidence of colitis.  Her clinical status stabilized and she was discharged home on 07/05/2021. ? ?She was admitted to the hospital with increasing shortness of breath and LE edema x 2 weeks.  On arrival to the ED, she had intermittent SVT with a heart rate up to 170 with mild hypotension.  Labs showed a WBC count 11.1.  Hemoglobin 6.9 ( Hg 9.1 on 07/20/2021).  Hematocrit 20.5.  MCV 110.8.  Platelet 143.  Potassium 3.0.  BUN 8.  Creatinine 0.73.  Alk phos 244.  Total bili 5.1.  AST 44.  ALT 23.  She was transfused 1 unit of PRBCs.  Posttransfusion H&H pending.  Chest x-ray showed no pulmonary edema or infiltrate. ECHO pending. A GI consult was requested due elevated LFTs.  Hyperbilirubinemia, direct bili > indirect.  An abdominal/pelvic CT without contrast 06/29/2021 showed severe hepatic steatosis and hepatomegaly without evidence of biliary ductal dilatation/obstruction.  Abdominal MRI/MRCP without contrast  also showed severe hepatic steatosis with hepatomegaly without evidence of biliary obstruction or concerning liver lesion/mass. ? ?She denies having any nausea or vomiting.  No abdominal pain.  No further diarrhea.  She is passing soft green to brown stools.  No rectal bleeding or melena.  No new medications with exception of folic acid for folic acid deficiency which was started following her hospital admission 06/30/2021.  She denies any alcohol use for years.  She drank alcohol on a daily basis for an undetermined amount of time many years ago.  She is unable to further clarify her history of alcohol use.  No drug use.  Infrequent NSAID use.  Her mother died at the age of 54 secondary to liver cancer. ? ?Abdominal MRI/MRCP 07/30/2021 without contrast: ?Multiplanar multisequence MR imaging of the abdomen was performed. ?Heavily T2-weighted images of the biliary and pancreatic ducts were ?obtained, and three-dimensional MRCP images were rendered by post ?processing. ?  ?COMPARISON:  No prior abdominal MRI. CT the abdomen and pelvis ?06/29/2021. ?  ?FINDINGS: ?Comment: Today's study is limited for detection and characterization ?of visceral and/or vascular lesions by lack of IV gadolinium. ?  ?Lower chest: Unremarkable. ?  ?Hepatobiliary: Liver is enlarged measuring 23.6 cm in craniocaudal ?span. Severe diffuse loss of signal intensity throughout the hepatic ?parenchyma on out of phase dual echo images, indicative of severe ?hepatic steatosis. No discrete cystic or solid hepatic lesions are ?confidently identified on today's noncontrast examination. No intra ?or extrahepatic biliary ductal dilatation is noted on  MRCP images. ?Common bile duct measures 6 mm in the porta hepatis. No filling ?defects within the common bile duct to suggest choledocholithiasis. ?Gallbladder is normal in appearance. ?  ?Pancreas: No definite pancreatic mass noted on today's noncontrast ?no pancreatic ductal dilatation. No pancreatic or  peripancreatic ?fluid collections or inflammatory changes. ?  ?Spleen:  Unremarkable. ?  ?Adrenals/Urinary Tract: Bilateral kidneys and bilateral adrenal ?glands are normal in appearance on today's noncontrast examination. ?  ?Stomach/Bowel: Examination.  Visualized portions are unremarkable. ?  ?Vascular/Lymphatic: No aneurysm identified in the visualized ?abdominal vasculature. No lymphadenopathy noted in the abdomen. ?  ?Other:  Trace volume of ascites. ?  ?Musculoskeletal: No aggressive appearing osseous lesions are noted ?in the visualized portions of the skeleton. ?  ?IMPRESSION: ?1. No findings to suggest biliary tract obstruction. ?2. Severe hepatomegaly with severe hepatic steatosis. ?3. Trace volume of ascites. ?  ? ? ?Abdominal/pelvic CT without contrast 06/29/2021: ?Multidetector CT imaging of the abdomen and pelvis was performed ?following the standard protocol without IV contrast. ?  ?RADIATION DOSE REDUCTION: This exam was performed according to the ?departmental dose-optimization program which includes automated ?exposure control, adjustment of the mA and/or kV according to ?patient size and/or use of iterative reconstruction technique. ?  ?COMPARISON:  None. ?  ?FINDINGS: ?Lower chest: No acute findings. ?  ?Hepatobiliary: Severe diffuse hepatic steatosis and hepatomegaly ?noted. No mass visualized on this unenhanced exam. Gallbladder is ?unremarkable. No evidence of biliary ductal dilatation. ?  ?Pancreas: No mass or inflammatory process visualized on this ?unenhanced exam. ?  ?Spleen:  Within normal limits in size. ?  ?Adrenals/Urinary tract: No evidence of urolithiasis or ?hydronephrosis. Unremarkable unopacified urinary bladder. ?  ?Stomach/Bowel: No evidence of obstruction, inflammatory process, or ?abnormal fluid collections. Normal appendix visualized. ?  ?Vascular/Lymphatic: No pathologically enlarged lymph nodes ?identified. No evidence of abdominal aortic aneurysm. Aortic ?atherosclerotic  calcification noted. ?  ?Reproductive:  No mass or other significant abnormality. ?  ?Other:  None. ?  ?Musculoskeletal:  No suspicious bone lesions identified. ?  ?: ?Severe hepatic steatosis and hepatomegaly.  No other acute findings. ?  ?GI PROCEDURES: ? ?EGD 07/01/2021: ?- Normal esophagus. ?- Normal stomach. ?- Normal examined duodenum. Biopsied. ? ?Colonoscopy 07/01/2021: ?- Normal mucosa in the entire examined colon. Biopsied. ?SMALL BOWEL, RANDOM, BIOPSY:  ?Fragments of small bowel mucosa with partial blunting of villi.  ?Mild inflammation is seen but no increase in intraepithelial lymphocytes  ?is noted.  ?The current biopsy is not diagnostic of celiac disease.  ?No dysplasia or malignancy is seen.  ? ?B. COLON, RANDOM, BIOPSY:  ?Fragments of large bowel mucosa with no significant inflammation,  ?chronic crypt architectural changes, dysplasia or malignancy seen.  ?Clinical and endoscopic correlation is required.  ? ? ?Past Medical History:  ?Diagnosis Date  ? Acute bronchitis   ? Cervical high risk HPV (human papillomavirus) test positive 12/2017  ? Negative subtype 16, 18/45  ? Chronic back pain   ? Complication of anesthesia   ? "didn't wake up"  ? Cough   ? Insomnia, unspecified   ? Other malaise and fatigue   ? Other specified diseases of blood and blood-forming organs(289.89)   ? Tobacco use disorder   ? ? ?Past Surgical History:  ?Procedure Laterality Date  ? BIOPSY  07/01/2021  ? Procedure: BIOPSY;  Surgeon: Carol Ada, MD;  Location: [place];  Service: Gastroenterology;;  EGD and COLON  ? COLONOSCOPY WITH PROPOFOL N/A 07/01/2021  ? Procedure: COLONOSCOPY WITH PROPOFOL;  Surgeon: Benson Norway,  Saralyn Pilar, MD;  Location: [place];  Service: Gastroenterology;  Laterality: N/A;  ? ESOPHAGOGASTRODUODENOSCOPY N/A 03/17/2013  ? Procedure: ESOPHAGOGASTRODUODENOSCOPY (EGD);  Surgeon: Ladene Artist, MD;  Location: [facility] ENDOSCOPY;  Service: Endoscopy;  Laterality: N/A;  ? ESOPHAGOGASTRODUODENOSCOPY (EGD) WITH  PROPOFOL N/A 07/01/2021  ? Procedure: ESOPHAGOGASTRODUODENOSCOPY (EGD) WITH PROPOFOL;  Surgeon: Carol Ada, MD;  Location: [place];  Service: Gastroenterology;  Laterality: N/A;  ? FRACTURE SURGERY    ?

## 2021-07-30 NOTE — Hospital Course (Signed)
H&P per Dr. Flossie Buffy ?Laura [name] is a 61 y.o. adult female who identifies as female with medical history significant of hypertension who presents with concerns of increasing shortness of breath and edema. ?  ?Patient is a limited historian and not able to provide a good timeline.  He reports feeling short of breath with ambulation for some time and also has noted edema of his legs and hands.  Denies any chest pain or palpitation.  He was admitted mid March with hypotension, dehydration with electrolyte derangements from chronic diarrhea which he says is now resolved.  He also required transfusion after hemoglobin downward trended from 7.8-5.8 with hydration at that time. ?  ?On arrival to the ED, he was afebrile and had intermittent SVT with heart rate up to 170 with mild hypotension with SBP of 95 on room air. ?Mom mild leukocytosis of 11.1, hemoglobin of 6.9 from a prior of 9.1 last week.  Potassium of 3, magnesium is 0.9, corrected calcium of 8.3. ?He was noted to have mildly elevated AST of 44, total bilirubin has trended up from 3.1-5.1.  Alk phos also elevated at 244.  He denies any abdominal pain or symptoms. ?  ?Recent CT of the abdomen pelvis on 3/12 showed severe hepatic steatosis and hepatomegaly but no biliary obstruction. ?  ?Chest x-ray showed no pulmonary edema or infiltrate. ? ?Patient transfused 1 unit packed red blood cells. ?GI consultation obtained. ?

## 2021-07-30 NOTE — Assessment & Plan Note (Addendum)
Urine culture with > 100,000 colonies of Proteus Mirabilis and Klebsiella pneumonia with resistance to Macrobid and ampicillin.   ?-Patient received a full course of treatment with ciprofloxacin.  -Outpatient follow-up.   ?

## 2021-07-30 NOTE — ED Notes (Signed)
IV team arriving to [facility] ?

## 2021-07-30 NOTE — ED Notes (Signed)
Patient removed monitoring equipments, informed tele monitoring is needed but still refused.  ?

## 2021-07-30 NOTE — Assessment & Plan Note (Addendum)
K of 3 on admission. ?-Potassium repleted.   ?

## 2021-07-30 NOTE — ED Notes (Signed)
Pt had another episode of unsustained SVT with rate as high as 165 lasting approx 1 minutes. EDP and RN at bedside. Pt in no distress during episode.  ?

## 2021-07-30 NOTE — Plan of Care (Signed)

## 2021-07-30 NOTE — ED Notes (Signed)
Pt states she does not want her medication right now  ?

## 2021-07-30 NOTE — Assessment & Plan Note (Addendum)
Possible third spacing or potential hepatic etiology. BNP is low and no CXR finding of edema to suggest cardiac cause.  ?Unable to diuresis while receiving blood transfusion  ?-2D echo with normal EF,NWMA, grade 1 diastolic dysfunction. ?-Continue IV albumin twice daily x2 days. ?-Patient given Lasix in between transfusions of packed red blood cells. ?-Outpatient follow-up with PCP. ? ? ?

## 2021-07-30 NOTE — H&P (Signed)
?History and Physical  ? ? ?Patient: Laura [name] WRU:[id number] DOB: 05/20/1960 ?DOA: 07/29/2021 ?DOS: the patient was seen and examined on 07/30/2021 ?PCP: Lauree Chandler, NP  ?Patient coming from: Home ? ?Chief Complaint:  ?Chief Complaint  ?Patient presents with  ? Edema  ? ?HPI: Laura [name] is a 61 y.o. adult female who identifies as female with medical history significant of hypertension who presents with concerns of increasing shortness of breath and edema. ? ?Patient is a limited historian and not able to provide a good timeline.  He reports feeling short of breath with ambulation for some time and also has noted edema of his legs and hands.  Denies any chest pain or palpitation.  He was admitted mid March with hypotension, dehydration with electrolyte derangements from chronic diarrhea which he says is now resolved.  He also required transfusion after hemoglobin downward trended from 7.8-5.8 with hydration at that time. ? ?On arrival to the ED, he was afebrile and had intermittent SVT with heart rate up to 170 with mild hypotension with SBP of 95 on room air. ?Mom mild leukocytosis of 11.1, hemoglobin of 6.9 from a prior of 9.1 last week.  Potassium of 3, magnesium is 0.9, corrected calcium of 8.3. ?He was noted to have mildly elevated AST of 44, total bilirubin has trended up from 3.1-5.1.  Alk phos also elevated at 244.  He denies any abdominal pain or symptoms. ? ?Recent CT of the abdomen pelvis on 3/12 showed severe hepatic steatosis and hepatomegaly but no biliary obstruction. ? ?Chest x-ray showed no pulmonary edema or infiltrate. ? ?Review of Systems: As mentioned in the history of present illness. All other systems reviewed and are negative. ?Past Medical History:  ?Diagnosis Date  ? Acute bronchitis   ? Cervical high risk HPV (human papillomavirus) test positive 12/2017  ? Negative subtype 16, 18/45  ? Chronic back pain   ? Complication of anesthesia   ? "didn't wake up"  ? Cough   ? Insomnia,  unspecified   ? Other malaise and fatigue   ? Other specified diseases of blood and blood-forming organs(289.89)   ? Tobacco use disorder   ? ?Past Surgical History:  ?Procedure Laterality Date  ? BIOPSY  07/01/2021  ? Procedure: BIOPSY;  Surgeon: Carol Ada, MD;  Location: [place];  Service: Gastroenterology;;  EGD and COLON  ? COLONOSCOPY WITH PROPOFOL N/A 07/01/2021  ? Procedure: COLONOSCOPY WITH PROPOFOL;  Surgeon: Carol Ada, MD;  Location: [place];  Service: Gastroenterology;  Laterality: N/A;  ? ESOPHAGOGASTRODUODENOSCOPY N/A 03/17/2013  ? Procedure: ESOPHAGOGASTRODUODENOSCOPY (EGD);  Surgeon: Ladene Artist, MD;  Location: [facility] ENDOSCOPY;  Service: Endoscopy;  Laterality: N/A;  ? ESOPHAGOGASTRODUODENOSCOPY (EGD) WITH PROPOFOL N/A 07/01/2021  ? Procedure: ESOPHAGOGASTRODUODENOSCOPY (EGD) WITH PROPOFOL;  Surgeon: Carol Ada, MD;  Location: [place];  Service: Gastroenterology;  Laterality: N/A;  ? FRACTURE SURGERY    ? Laminectomy and foraminotomy at L5-S1    12/12/2019  ? TONSILLECTOMY    ? ?Social History:  reports that he has been smoking cigarettes. He has a 6.00 pack-year smoking history. He has never used smokeless tobacco. He reports that he does not currently use alcohol. He reports that he does not use drugs. ? ?Allergies  ?Allergen Reactions  ? Garlic Anaphylaxis  ? Aspirin Hives  ? Lyrica [Pregabalin]   ?  Nightmares, did not help neuropathy  ? Penicillins Hives  ? ? ?Family History  ?Problem Relation Age of Onset  ? Arthritis Mother   ?  Hypertension Sister   ? Hypertension Brother   ? Hypertension Sister   ? Hypertension Sister   ? Hypertension Sister   ? ? ?Prior to Admission medications   ?Medication Sig Start Date End Date Taking? Authorizing Provider  ?albuterol (VENTOLIN HFA) 108 (90 Base) MCG/ACT inhaler INHALE 2 PUFFS INTO THE LUNGS EVERY 6 HOURS AS NEEDED FOR WHEEZING OR SHORTNESS OF BREATH ?Patient taking differently: Inhale 2 puffs into the lungs every 6 (six) hours as  needed for wheezing or shortness of breath. 05/19/19   Lauree Chandler, NP  ?calcium carbonate (OSCAL) 1500 (600 Ca) MG TABS tablet Take 600 mg of elemental calcium by mouth 3 (three) times daily.    [provider]  ?diphenhydrAMINE (BENADRYL) 25 MG tablet Take 25 mg by mouth every 6 (six) hours as needed for allergies.    [provider]  ?folic acid (FOLVITE) 1 MG tablet Take 2 tablets (2 mg total) by mouth daily. 02/08/20   Lauree Chandler, NP  ?furosemide (LASIX) 20 MG tablet Take 1 tablet (20 mg total) by mouth daily for 7 days. 07/20/21 07/27/21  Luna Fuse, MD  ?oxyCODONE-acetaminophen (PERCOCET/ROXICET) 5-325 MG tablet Take one tablet by mouth every 8-12 hours as needed for pain. 07/24/21   Lauree Chandler, NP  ?potassium chloride SA (KLOR-CON M) 20 MEQ tablet Take 1 tablet (20 mEq total) by mouth daily for 7 days. 07/20/21 07/27/21  Luna Fuse, MD  ?predniSONE (STERAPRED UNI-PAK 21 TAB) 10 MG (21) TBPK tablet Use as directed ?Patient taking differently: Take 10 mg by mouth See admin instructions. 5,7,9,0,3,8 07/07/21   Lauree Chandler, NP  ?Tetrahydrozoline HCl (VISINE OP) Place 1 drop into both eyes daily as needed (For dry eyes).    [provider]  ? ? ?Physical Exam: ?Vitals:  ? 07/29/21 2346 07/30/21 0001 07/30/21 0100 07/30/21 0130  ?BP:  104/69 100/72 109/73  ?Pulse:  94 86 92  ?Resp:  '18 18 18  ' ?Temp:  98.1 ?F (36.7 ?C)    ?TempSrc: Oral Oral    ?SpO2:  100% 99% 99%  ? ?Constitutional: NAD, calm, comfortable, female appearing older than stated age laying flat in bed ?Eyes:  lids and conjunctivae normal with discharge around bilateral eyes ?ENMT: Mucous membranes are moist.  ?Neck: normal, supple,  ?Respiratory: clear to auscultation bilaterally, no wheezing, no crackles. Normal respiratory effort. No accessory muscle use.  ?Cardiovascular: Regular rate and rhythm, no murmurs / rubs / gallops.  Nonpitting edema of bilateral lower extremity up to knee. ?Abdomen: no  tenderness, no masses palpated.  Bowel sounds positive.  ?Musculoskeletal: no clubbing / cyanosis. No joint deformity upper and lower extremities. Good ROM, no contractures. Normal muscle tone.  ?Skin: no rashes, lesions, ulcers. No induration ?Neurologic: CN 2-12 grossly intact. Strength 5/5 in all 4.  ?Psychiatric: Normal judgment and insight. Alert and oriented x 3.  Irritated mood. ?Data Reviewed: ? ?See HPI ? ?Assessment and Plan: ?* Symptomatic anemia ?Hgb of 6.9 down from 9.1just last week. Required 2upRBC transfusion in last admission mid-March after he presented with hypotension and had downward trending Hgb following fluid resuscitation.  He also had chronic diarrhea at that time and underwent colonoscopy and EGD on 07/01/2021 that was unremarkable.  FOBT x2 was negative at that time.  He was noted to have folate deficiency and was started on supplementation at home. ?-Transfuse 1u pRBC and follow post H/H.  Transfusion threshold of <7. ?-recheck Folic level. Also will check fractionated  bilirubin given elevated level. ? ?Hypotension ?SBP 95 and improving with transfusion ? ?Serum total bilirubin elevated ?Total bilirubin of 5.4 which has trended up from 3.1 several weeks ago.  Also has elevated alkaline phosphatase concerning for biliary obstruction.  recently had a CT abdomen and pelvis on 3/12 that showed severe hepatic steatosis and hepatomegaly but no biliary dilatation.  Will obtain MRCP to further evaluate. ? ?Edema ?Possible third spacing or potential hepatic etiology. BNP is low and no CXR finding of edema to suggest cardiac cause.  ?Unable to diuresis while receiving blood transfusion  ?Will need to reassess tomorrow following transfusion and imaging result ? ? ? ?Hypocalcemia ?Corrected Ca of 8.3. Replete with IV Calcium gluconate.  ? ?SVT (supraventricular tachycardia) ([place]) ?Patient having intermittent nonsustained SVT likely secondary to electrolyte derangement. Unclear cause of low  electrolytes since she is not having any GI loss or renal loss.  ?Replete potassium to goal of 4 and magnesium to goal of 2 ?-Continue to monitor on continuous telemetry ? ?Hypomagnesemia ?Mg of 0.9. Replete with IV Mg

## 2021-07-30 NOTE — Assessment & Plan Note (Addendum)
Total bilirubin of 5.4 on admission, which has trended up from 3.1 several weeks ago.   ?-Total bilirubin fluctuated was 5.4 >>>> 6.5 >>>> 8.0>>> 5.2>>> 7.9 >>>> 5.9>>>>6.6>>> 4.2 ?-  Alk phosphatase levels elevated but trended down. ?- recently had a CT abdomen and pelvis on 3/12 that showed severe hepatic steatosis and hepatomegaly but no biliary dilatation.  ?-MRCP done with no findings to suggest biliary tract obstruction, severe hepatomegaly with severe hepatic steatosis, trace volume ascites.  ?-Bilirubin fluctuating trending back down currently at 7.9 from 5.2 from 8.0, indirect and direct bilirubin levels fluctuating. AST of 46. ?-Haptoglobin within normal limits..  LDH of 219.  Ceruloplasmin level of 15.7. ?-Patient received a dose of IV copper 07/31/2021 per hematology. ?-IgG serum of 1325.  Alpha 1 antitrypsin of 181.  ANA negative. ?-24-hour urine copper study pending to evaluate for Wilson's disease per GI, hemochromatosis evaluation also pending per GI. ?-Urinary copper 24-hour discontinued per GI as patient had received IV copper. ?-May need liver biopsy. ?-Patient was followed by GI throughout the hospitalization. ?-Outpatient follow-up with GI in 2 to 3 weeks. ?

## 2021-07-30 NOTE — Assessment & Plan Note (Addendum)
Patient having intermittent nonsustained SVT likely secondary to electrolyte derangement in the setting of symptomatic anemia. Unclear cause of low electrolytes since she is not having any GI loss or renal loss. ?-Patient with a 40 beat run of SVT in the morning of 07/31/2021 however remained asymptomatic.  ?-No SVT noted on telemetry since 08/01/2021 ?-Magnesium admission 0.9 >>>1.2>>>> 2.0>>>> 1.5>> 2.1>>>> 1.8, potassium at 3.0>>> 3.2>>>> 3.5>>> 3.6>>>> 3.2>>> 4.1>>> 4.2 ?-Goal to keep potassium approximately 4, magnesium approximately 2.  ?-Patient's potassium and magnesium were repleted. ?Patient placed on oral magnesium supplementation. ?-2D echo done with normal EF,NWMA, grade 1 diastolic dysfunction. ?-Patient status post total transfusion 3 units packed red blood cells with hemoglobin stabilizing at 7.9 by day of discharge. ?-Patient started on low-dose Lopressor 12.5 mg twice daily.  ?-Patient seen in consultation by cardiology who recommended keeping on low-dose metoprolol and uptitrate as blood pressure allows.  ?-Per cardiology patient is not a candidate at present time for invasive treatment given severity of other problems and recommending optimization of electrolytes and keeping hemoglobin above 7.  ?-Outpatient follow-up with cardiology. ?

## 2021-07-30 NOTE — Assessment & Plan Note (Addendum)
-   See elevated bilirubin above. ?-Per GI. ?

## 2021-07-30 NOTE — Assessment & Plan Note (Addendum)
Hgb of 6.9 down from 9.1just last week on presentation. ? Required 2upRBC transfusion in last admission mid-March after he presented with hypotension and had downward trending Hgb following fluid resuscitation.   ?-He also had chronic diarrhea at that time and underwent colonoscopy and EGD on 07/01/2021 that was unremarkable.  FOBT x2 was negative at that time.  ?- He was noted to have folate deficiency and was started on supplementation at home. ?-Status post transfusion 1 unit packed red blood cells on admission with hemoglobin of 6.6 (07/31/2021). ?-Status post transfusion of 2 units packed red blood cells 07/31/2021 with hemoglobin currently at 9.4 this morning.  ?-Patient with no overt gross bleeding. ?-Patient with elevated bilirubin, MCV of 106.6, indirect bilirubin of 3.5.   ?-LDH of 219.  Haptoglobin within normal limits of 69.  Absolute reticulocyte count of 37.9.  Per hematology peripheral smear with no schistocytes noted.   ?-Ceruloplasmin level decreased at 15.7. ?-Status post IV copper 07/31/2021. ?-Patient seen in consultation by gastroenterology who are recommended hematology input.   ?-Patient seen in consultation by hematology who were following, and reviewed patient's labs and per note no evidence of hemolytic anemia.  ?-Per hematology patient with folate deficiency as well as low ceruloplasmin, patient on folic acid as well as received copper supplementation and feel patient's anemia is likely secondary to anemia of chronic disease secondary to multiple medical issues including liver disease.  ?-Hematology recommended supportive care and has signed off as of 08/01/2021.  ?-Transfusion threshold hemoglobin < 7. ?-Patient was maintained on folic acid 1 mg daily. ?-If continued drop in hemoglobin may need capsule endoscopy for further evaluation per GI/hematology. ?-Patient was followed by gastroenterology who followed the patient throughout the hospitalization.  Hemoglobin fluctuated but stabilized and  was 7.9 by day of discharge. ?-Per GI no plans for further assessment of GI bleed during this hospitalization and patient set up to follow-up in the outpatient setting in 2 to 3 weeks for further management. ?-Patient also follow-up with PCP in 1 to 2 weeks and will need a CBC done in 1 week. ?-Patient be discharged in stable and improved condition. ?-GI following and appreciate input and recommendations. ?- ?

## 2021-07-30 NOTE — Assessment & Plan Note (Addendum)
SBP 95 and improved with transfusion. ?Blood pressure improvement. ?Albumin of 2.3. ?Status post IV albumin twice daily x2 days. ? ?

## 2021-07-30 NOTE — ED Notes (Signed)
Report received. Reported pt has been uncooperative, not agreeable, not following directions: has pulled 2 IVs out, no current IV access, pending 2nd IV team to attempt access. Related delay in care. ?

## 2021-07-30 NOTE — ED Notes (Signed)
Pt had another period of SVT lasting approx 1 minute. Converted to NSR rate of 80's.  ?

## 2021-07-30 NOTE — Assessment & Plan Note (Addendum)
-  Patient placed on folic acid 1 mg daily. ?

## 2021-07-30 NOTE — ED Notes (Signed)
Pt having multiple intermittent periods of svt. 3 times in the last 5 minutes, lasting approx 1 minute each. MD at bedside ?

## 2021-07-30 NOTE — Progress Notes (Signed)
Patient stated pain was a 9 on a 0-10 scale. MD notified for orders. ?

## 2021-07-30 NOTE — Consult Note (Signed)
? ? [place] ?Telephone:([phone]   [phone] ? ?CONSULT NOTE ? ?REFERRING PHYSICIAN: Dr. Irine Seal ? ?REASON FOR CONSULTATION:  ?61 years old adult with persistent anemia ? ?HPI ?Laura [name] is a 61 y.o. adult female identifying herself as female named Laura [name].  The patient has a past medical history significant for chronic back pain, anemia, diarrhea, hepatic steatosis and hepatomegaly.  The patient was home when a home health nurse visited him and noticed that He is complaining of increasing fatigue and weakness as well as tachycardia.  He was also hypotensive and he advised her to go to the emergency department for evaluation.  He was complaining of shortness of breath and lower extremities swelling.  In the emergency department the patient was found to have tachycardia with SVT in addition to hypotension.  CBC showed hemoglobin was down to 6.9.  He had mild thrombocytopenia with platelet count of 143.  The patient received 1 unit of PRBCs transfusion.  He had previous EGD and colonoscopy few weeks ago by Dr. Benson Norway and they were unremarkable.  He was also found to have elevated bilirubin level with a mix of direct and indirect 0 point she had abdominal MRI/MRCP without contrast that showed severe hepatic steatosis and hepatomegaly but without biliary obstruction, liver lesions or mass.  He has a history of alcohol abuse but not recently.  Pete CBC today showed improvement of her hemoglobin up to 7.9 and hematocrit 24.1% with MCV of 106.6.  He was found to have low serum folate. ?I was consulted to see the patient today for evaluation of his anemia. ?When seen today he was accompanied by a friend at the bedside and He is still in the emergency department.  He is feeling fine except for mild fatigue.  He denied having any chest pain, shortness of breath except with exertion with no cough or hemoptysis.  He has no nausea, vomiting, diarrhea or constipation.  He has no headache or  visual changes. ?Family history significant for mother with arthritis and brother and sisters with hypertensions. ?The patient is single and has no children.  He is currently on disability.  He has a history of smoking as well as alcohol abuse in the past but not recently. ? ?HPI ? ?Past Medical History:  ?Diagnosis Date  ? Acute bronchitis   ? Cervical high risk HPV (human papillomavirus) test positive 12/2017  ? Negative subtype 16, 18/45  ? Chronic back pain   ? Complication of anesthesia   ? "didn't wake up"  ? Cough   ? Insomnia, unspecified   ? Other malaise and fatigue   ? Other specified diseases of blood and blood-forming organs(289.89)   ? Tobacco use disorder   ? ? ?Past Surgical History:  ?Procedure Laterality Date  ? BIOPSY  07/01/2021  ? Procedure: BIOPSY;  Surgeon: Carol Ada, MD;  Location: [facility];  Service: Gastroenterology;;  EGD and COLON  ? COLONOSCOPY WITH PROPOFOL N/A 07/01/2021  ? Procedure: COLONOSCOPY WITH PROPOFOL;  Surgeon: Carol Ada, MD;  Location: [facility];  Service: Gastroenterology;  Laterality: N/A;  ? ESOPHAGOGASTRODUODENOSCOPY N/A 03/17/2013  ? Procedure: ESOPHAGOGASTRODUODENOSCOPY (EGD);  Surgeon: Ladene Artist, MD;  Location[facility] ENDOSCOPY;  Service: Endoscopy;  Laterality: N/A;  ? ESOPHAGOGASTRODUODENOSCOPY (EGD) WITH PROPOFOL N/A 07/01/2021  ? Procedure: ESOPHAGOGASTRODUODENOSCOPY (EGD) WITH PROPOFOL;  Surgeon: Carol Ada, MD;  Location: [place] [facility];  Service: Gastroenterology;  Laterality: N/A;  ? FRACTURE SURGERY    ? Laminectomy and foraminotomy at  L5-S1    12/12/2019  ? TONSILLECTOMY    ? ? ?Family History  ?Problem Relation Age of Onset  ? Arthritis Mother   ? Hypertension Sister   ? Hypertension Brother   ? Hypertension Sister   ? Hypertension Sister   ? Hypertension Sister   ? ? ?Social History ?Social History  ? ?Tobacco Use  ? Smoking status: Every Day  ?  Packs/day: 0.50  ?  Years: 12.00  ?  Pack years: 6.00  ?  Types: Cigarettes  ? Smokeless  tobacco: Never  ?Vaping Use  ? Vaping Use: Former  ?Substance Use Topics  ? Alcohol use: Not Currently  ? Drug use: No  ? ? ?Allergies  ?Allergen Reactions  ? Garlic Anaphylaxis  ? Aspirin Hives  ? Lyrica [Pregabalin]   ?  Nightmares, did not help neuropathy  ? Penicillins Hives  ? ? ?Current Facility-Administered Medications  ?Medication Dose Route Frequency Provider Last Rate Last Admin  ? albumin human 25 % solution 25 g  25 g Intravenous BID Eugenie Filler, MD      ? ciprofloxacin (CIPRO) IVPB 400 mg  400 mg Intravenous Q12H Eugenie Filler, MD      ? folic acid injection 1 mg  1 mg Intravenous Daily Eugenie Filler, MD      ? magnesium sulfate IVPB 4 g 100 mL  4 g Intravenous Once Eugenie Filler, MD      ? ?Current Outpatient Medications  ?Medication Sig Dispense Refill  ? albuterol (VENTOLIN HFA) 108 (90 Base) MCG/ACT inhaler INHALE 2 PUFFS INTO THE LUNGS EVERY 6 HOURS AS NEEDED FOR WHEEZING OR SHORTNESS OF BREATH (Patient taking differently: Inhale 2 puffs into the lungs every 6 (six) hours as needed for wheezing or shortness of breath.) [id number] g 3  ? folic acid (FOLVITE) 1 MG tablet Take 2 tablets (2 mg total) by mouth daily. 60 tablet 5  ? furosemide (LASIX) 20 MG tablet Take 1 tablet (20 mg total) by mouth daily for 7 days. 7 tablet 0  ? oxyCODONE-acetaminophen (PERCOCET/ROXICET) 5-325 MG tablet Take one tablet by mouth every 8-12 hours as needed for pain. 90 tablet 0  ? potassium chloride SA (KLOR-CON M) 20 MEQ tablet Take 1 tablet (20 mEq total) by mouth daily for 7 days. 7 tablet 0  ? Tetrahydrozoline HCl (VISINE OP) Place 1 drop into both eyes daily as needed (For dry eyes).    ? predniSONE (STERAPRED UNI-PAK 21 TAB) 10 MG (21) TBPK tablet Use as directed (Patient not taking: Reported on 07/30/2021) 21 tablet 0  ? ? ?Review of Systems ? ?Constitutional: positive for fatigue ?Eyes: negative ?Ears, nose, mouth, throat, and face: negative ?Respiratory: positive for dyspnea on  exertion ?Cardiovascular: negative ?Gastrointestinal: negative ?Genitourinary:negative ?Integument/breast: negative ?Hematologic/lymphatic: negative ?Musculoskeletal:negative ?Neurological: negative ?Behavioral/Psych: negative ?Endocrine: negative ?Allergic/Immunologic: negative ? ?Physical Exam ? ?[id number], healthy, no distress, well nourished, and well developed ?SKIN: skin color, texture, turgor are normal, no rashes or significant lesions ?HEAD: Normocephalic, No masses, lesions, tenderness or abnormalities ?EYES: normal, PERRLA, Conjunctiva are pink and non-injected ?EARS: External ears normal, Canals clear ?OROPHARYNX:no exudate, no erythema, and lips, buccal mucosa, and tongue normal  ?NECK: supple, no adenopathy, no JVD ?LYMPH:  no palpable lymphadenopathy, no hepatosplenomegaly ?BREAST:not examined ?LUNGS: clear to auscultation , and palpation ?HEART: regular rate & rhythm, no murmurs, and no gallops ?ABDOMEN:abdomen soft, non-tender, obese, normal bowel sounds, and no masses or organomegaly ?BACK: Back symmetric, no curvature., No CVA tenderness ?  EXTREMITIES:no joint deformities, effusion, or inflammation, no edema  ?NEURO: alert & oriented x 3 with fluent speech, no focal motor/sensory deficits ? ?PERFORMANCE STATUS: ECOG 1 ? ?LABORATORY DATA: ?Lab Results  ?Component Value Date  ? WBC 10.4 07/30/2021  ? HGB 7.9 (L) 07/30/2021  ? HCT 24.1 (L) 07/30/2021  ? MCV 106.6 (H) 07/30/2021  ? PLT 108 (L) 07/30/2021  ? ? ?@LASTCHEM @ ? ?RADIOGRAPHIC STUDIES: ?DG Chest 2 View ? ?Result Date: 07/29/2021 ?CLINICAL DATA:  Edema. EXAM: CHEST - 2 VIEW COMPARISON:  07/20/2021 FINDINGS: Lower lung volumes from prior exam. Minor bibasilar atelectasis. Stable heart size and mediastinal contours. No pulmonary edema. No confluent consolidation. No pleural effusion. No pneumothorax. No acute osseous findings. IMPRESSION: Low lung volumes with minor bibasilar atelectasis. Electronically Signed   By: Keith Rake M.D.   On:  07/29/2021 18:35  ? ?DG Chest 2 View ? ?Result Date: 07/20/2021 ?CLINICAL DATA:  Shortness of breath EXAM: CHEST - 2 VIEW COMPARISON:  06/29/2021 FINDINGS: The heart size and mediastinal contours are within normal limits. Mildly p

## 2021-07-30 NOTE — ED Notes (Signed)
Patient she does not want to take meds at this time, states maybe later.  ?

## 2021-07-30 NOTE — ED Notes (Signed)
Hem/onc Dr. Arbutus Ped at [facility] ?

## 2021-07-30 NOTE — Progress Notes (Signed)
?PROGRESS NOTE ? ? ? Laura [name]  MBW:[id number] DOB: 14-Dec-1960 DOA: 07/29/2021 ?PCP: Lauree Chandler, NP  ? ? ?Chief Complaint  ?Patient presents with  ? Edema  ? ? ?Brief Narrative:  ?H&P per Dr. Flossie Buffy ?Laura [name] is a 61 y.o. adult female who identifies as female with medical history significant of hypertension who presents with concerns of increasing shortness of breath and edema. ?  ?Patient is a limited historian and not able to provide a good timeline.  He reports feeling short of breath with ambulation for some time and also has noted edema of his legs and hands.  Denies any chest pain or palpitation.  He was admitted mid March with hypotension, dehydration with electrolyte derangements from chronic diarrhea which he says is now resolved.  He also required transfusion after hemoglobin downward trended from 7.8-5.8 with hydration at that time. ?  ?On arrival to the ED, he was afebrile and had intermittent SVT with heart rate up to 170 with mild hypotension with SBP of 95 on room air. ?Mom mild leukocytosis of 11.1, hemoglobin of 6.9 from a prior of 9.1 last week.  Potassium of 3, magnesium is 0.9, corrected calcium of 8.3. ?He was noted to have mildly elevated AST of 44, total bilirubin has trended up from 3.1-5.1.  Alk phos also elevated at 244.  He denies any abdominal pain or symptoms. ?  ?Recent CT of the abdomen pelvis on 3/12 showed severe hepatic steatosis and hepatomegaly but no biliary obstruction. ?  ?Chest x-ray showed no pulmonary edema or infiltrate. ? ?Patient transfused 1 unit packed red blood cells. ?GI consultation obtained.  ? ? ?Assessment & Plan: ? Principal Problem: ?  Symptomatic anemia ?Active Problems: ?  Hypotension ?  Serum total bilirubin elevated ?  Hypokalemia ?  Hypomagnesemia ?  Elevated alkaline phosphatase level ?  SVT (supraventricular tachycardia) ([place]) ?  Hypocalcemia ?  Edema ?  UTI (urinary tract infection) ?  Folate deficiency ? ? ? ?Assessment and Plan: ?* Symptomatic  anemia ?Hgb of 6.9 down from 9.1just last week. ? Required 2upRBC transfusion in last admission mid-March after he presented with hypotension and had downward trending Hgb following fluid resuscitation.   ?-He also had chronic diarrhea at that time and underwent colonoscopy and EGD on 07/01/2021 that was unremarkable.  FOBT x2 was negative at that time.  ?- He was noted to have folate deficiency and was started on supplementation at home. ?-Status post transfusion 1 unit packed red blood cells hemoglobin currently at 7.9.   ?-Repeat H&H.   ?-Patient with elevated bilirubin, MCV of 106.6, indirect bilirubin of 3.5.   ?-Check LDH, haptoglobin, peripheral smear, reticulocyte count.   ?-Patient seen in consultation by gastroenterology who are recommending hematology input.   ?-Hematology consult pending.   ?-Transfusion threshold hemoglobin < 7. ?-Folic acid 1 mg IV daily.   ?-GI following and appreciate input and recommendations. ? ?Hypotension ?SBP 95 and improved with transfusion. ?Albumin of 2.3. ?Place on IV albumin twice daily x2 days. ? ? ?Serum total bilirubin elevated ?Total bilirubin of 5.4 on admission, which has trended up from 3.1 several weeks ago.   ?-Also has elevated alkaline phosphatase concerning for biliary obstruction.  ?- recently had a CT abdomen and pelvis on 3/12 that showed severe hepatic steatosis and hepatomegaly but no biliary dilatation.  ?-MRCP done with no severe findings to suggest biliary tract obstruction, severe hepatomegaly with severe hepatic steatosis, trace volume ascites.  ?-Bilirubin trending up and currently  at 8.0, indirect bilirubin of 3.5, direct bilirubin of 4.5. ?-AST of 60, ALT of 26. ?-Check LDH, haptoglobin. ?-GI consulted. ? ?Folate deficiency ?- Folic acid 1 mg IV daily and can transition to oral folic acid in the next 1 to 2 days. ? ?UTI (urinary tract infection) ?Urine culture pending ?-Place on IV Ciprofloaxin pending urine culture results. ? ?Edema ?Possible third  spacing or potential hepatic etiology. BNP is low and no CXR finding of edema to suggest cardiac cause.  ?Unable to diuresis while receiving blood transfusion  ?-Check a 2D echo. ?-IV albumin twice daily x2 days. ?-Follow. ? ? ? ?Hypocalcemia ?Corrected Ca of 8.3.  ?-Repleted with IV Calcium gluconate.  ?-Repeat labs in the AM. ? ?SVT (supraventricular tachycardia) ([place]) ?Patient having intermittent nonsustained SVT likely secondary to electrolyte derangement. Unclear cause of low electrolytes since she is not having any GI loss or renal loss. ?-Magnesium admission 0.9 currently at 1.2, potassium at 3.0 currently at 3.2. ?-Magnesium sulfate 4 g IV x1, KDur 40 mEq every 4 hours x 2 doses. ?-Keep potassium > 4, magnesium > 2. ?-Check a 2D echo. ?-Continue to monitor on continuous telemetry ? ?Elevated alkaline phosphatase level ?- See elevated bilirubin above. ? ?Hypomagnesemia ?Mg of 0.9. ?Patient received a dose of IV mag currently at 1.2. ?-Magnesium sulfate 4 g IV x1. ? ?Hypokalemia ?K of 3 on admission. ?-Potassium at 3.2 this morning ?-Kdur 40 mEq every 4 hours x 2 doses.. ? ? ? ? ?  ? ? ?DVT prophylaxis: SCDs ?Code Status: Full ?Family Communication: Updated patient and cousin at bedside ?Disposition: Likely home once cleared by GI. ? ?Status is: Inpatient ?Remains inpatient appropriate because: Severity of illness ?  ?Consultants:  ?Gastroenterology ? ?Procedures:  ?Transfusion 1 unit packed red blood cells 07/29/2021 ?CT abdomen and pelvis 06/29/2021 ?Chest x-ray 07/29/2021 ?MRCP abdomen 07/30/2021 ? ? ? ?Antimicrobials:  ?None ? ? ?Subjective: ?Patient laying on gurney.  States no significant change with shortness of breath since admission.  Denies any chest pain.  No abdominal pain.  Asking for a bedside commode to have a bowel movement and frustrated that does not have 1 yet. ? ?Objective: ?Vitals:  ? 07/30/21 0300 07/30/21 0615 07/30/21 0726 07/30/21 0900  ?BP: 126/82 118/75 114/80 111/79  ?Pulse: 82 82 90 98   ?Resp: (!) '21 17 18 15  ' ?Temp:      ?TempSrc:      ?SpO2: 99% 99% 99% 97%  ? ? ?Intake/Output Summary (Last 24 hours) at 07/30/2021 1543 ?Last data filed at 07/30/2021 0246 ?Gross per 24 hour  ?Intake 1120 ml  ?Output --  ?Net 1120 ml  ? ?There were no vitals filed for this visit. ? ?Examination: ? ?General exam: Appears calm and comfortable  ?Respiratory system: Clear to auscultation bilaterally.  No wheezes, no crackles, no rhonchi.Marland Kitchen Respiratory effort normal. ?Cardiovascular system: S1 & S2 heard, RRR. No JVD, murmurs, rubs, gallops or clicks.  2-3+ bilateral lower extremity edema/pedal edema ?Gastrointestinal system: Abdomen is nondistended, soft and nontender. No organomegaly or masses felt. Normal bowel sounds heard. ?Central nervous system: Alert and oriented. No focal neurological deficits. ?Extremities: Symmetric 5 x 5 power. ?Skin: No rashes, lesions or ulcers ?Psychiatry: Judgement and insight appear normal. Mood & affect appropriate.  ? ? ? ?Data Reviewed:  ? ?CBC: ?Recent Labs  ?Lab 07/29/21 ?2105 07/30/21 ?3149  ?WBC 11.1* 10.4  ?NEUTROABS 9.4*  --   ?HGB 6.9* 7.9*  ?HCT 20.5* 24.1*  ?MCV 110.8* 106.6*  ?PLT  143* 108*  ? ? ?Basic Metabolic Panel: ?Recent Labs  ?Lab 07/29/21 ?2105 07/29/21 ?2314 07/30/21 ?3736  ?NA 136  --  135  ?K 3.0*  --  3.2*  ?CL 93*  --  95*  ?CO2 30  --  29  ?GLUCOSE 103*  --  93  ?BUN 8  --  9  ?CREATININE 0.73  --  0.69  ?CALCIUM 7.3*  --  7.4*  ?MG  --  0.9* 1.2*  ? ? ?GFR: ?CrCl cannot be calculated (Unknown ideal weight.). ? ?Liver Function Tests: ?Recent Labs  ?Lab 07/29/21 ?2105 07/30/21 ?0627 07/30/21 ?1100  ?AST 44*  --  60*  ?ALT 23  --  26  ?ALKPHOS 244*  --  253*  ?BILITOT 5.1* 6.5* 8.0*  ?PROT 5.8*  --  6.3*  ?ALBUMIN 2.2*  --  2.3*  ? ? ?CBG: ?No results for input(s): GLUCAP in the last 168 hours. ? ? ?No results found for this or any previous visit (from the past 240 hour(s)).  ? ? ? ? ? ?Radiology Studies: ?DG Chest 2 View ? ?Result Date: 07/29/2021 ?CLINICAL DATA:   Edema. EXAM: CHEST - 2 VIEW COMPARISON:  07/20/2021 FINDINGS: Lower lung volumes from prior exam. Minor bibasilar atelectasis. Stable heart size and mediastinal contours. No pulmonary edema. No confluen

## 2021-07-30 NOTE — ED Notes (Signed)
Pt arrives to room yellow 40 from green 1. ?

## 2021-07-30 NOTE — ED Notes (Addendum)
Pt had 2-3 minute period of SVT- rate 160's. Pt converted back to NSR - rate 90. Pt not c/o chest pain or in any distress during and after period of SVT. MD notified.  ?

## 2021-07-31 DIAGNOSIS — R609 Edema, unspecified: Secondary | ICD-10-CM | POA: Diagnosis not present

## 2021-07-31 DIAGNOSIS — R748 Abnormal levels of other serum enzymes: Secondary | ICD-10-CM | POA: Diagnosis not present

## 2021-07-31 DIAGNOSIS — D649 Anemia, unspecified: Secondary | ICD-10-CM | POA: Diagnosis not present

## 2021-07-31 DIAGNOSIS — R0602 Shortness of breath: Secondary | ICD-10-CM | POA: Diagnosis not present

## 2021-07-31 LAB — AFP TUMOR MARKER: AFP, Serum, Tumor Marker: 4.7 ng/mL (ref 0.0–9.2)

## 2021-07-31 LAB — CBC WITH DIFFERENTIAL/PLATELET
Abs Immature Granulocytes: 0.05 10*3/uL (ref 0.00–0.07)
Basophils Absolute: 0 10*3/uL (ref 0.0–0.1)
Basophils Relative: 0 %
Eosinophils Absolute: 0.1 10*3/uL (ref 0.0–0.5)
Eosinophils Relative: 1 %
HCT: 18.7 % — ABNORMAL LOW (ref 36.0–46.0)
Hemoglobin: 6.6 g/dL — CL (ref 12.0–15.0)
Immature Granulocytes: 1 %
Lymphocytes Relative: 8 %
Lymphs Abs: 0.8 10*3/uL (ref 0.7–4.0)
MCH: 36.1 pg — ABNORMAL HIGH (ref 26.0–34.0)
MCHC: 35.3 g/dL (ref 30.0–36.0)
MCV: 102.2 fL — ABNORMAL HIGH (ref 80.0–100.0)
Monocytes Absolute: 0.6 10*3/uL (ref 0.1–1.0)
Monocytes Relative: 6 %
Neutro Abs: 8.7 10*3/uL — ABNORMAL HIGH (ref 1.7–7.7)
Neutrophils Relative %: 84 %
Platelets: 94 10*3/uL — ABNORMAL LOW (ref 150–400)
RBC: 1.83 MIL/uL — ABNORMAL LOW (ref 3.87–5.11)
RDW: 19.9 % — ABNORMAL HIGH (ref 11.5–15.5)
WBC: 10.3 10*3/uL (ref 4.0–10.5)
nRBC: 0 % (ref 0.0–0.2)

## 2021-07-31 LAB — BASIC METABOLIC PANEL
Anion gap: 9 (ref 5–15)
BUN: 6 mg/dL (ref 6–20)
CO2: 28 mmol/L (ref 22–32)
Calcium: 7.4 mg/dL — ABNORMAL LOW (ref 8.9–10.3)
Chloride: 96 mmol/L — ABNORMAL LOW (ref 98–111)
Creatinine, Ser: 0.52 mg/dL (ref 0.44–1.00)
GFR, Estimated: >60 mL/min (ref >60)
Glucose, Bld: 91 mg/dL (ref 70–99)
Potassium: 3.6 mmol/L (ref 3.5–5.1)
Sodium: 133 mmol/L — ABNORMAL LOW (ref 135–145)

## 2021-07-31 LAB — HAPTOGLOBIN: Haptoglobin: 69 mg/dL (ref 33–346)

## 2021-07-31 LAB — HEPATIC FUNCTION PANEL
ALT: 22 U/L (ref 0–44)
AST: 46 U/L — ABNORMAL HIGH (ref 15–41)
Albumin: 1.8 g/dL — ABNORMAL LOW (ref 3.5–5.0)
Alkaline Phosphatase: 197 U/L — ABNORMAL HIGH (ref 38–126)
Bilirubin, Direct: 2.7 mg/dL — ABNORMAL HIGH (ref 0.0–0.2)
Indirect Bilirubin: 2.5 mg/dL — ABNORMAL HIGH (ref 0.3–0.9)
Total Bilirubin: 5.2 mg/dL — ABNORMAL HIGH (ref 0.3–1.2)
Total Protein: 4.9 g/dL — ABNORMAL LOW (ref 6.5–8.1)

## 2021-07-31 LAB — PREPARE RBC (CROSSMATCH): Order Confirmation: POSITIVE

## 2021-07-31 LAB — HEMOGLOBIN AND HEMATOCRIT, BLOOD
HCT: 29.1 % — ABNORMAL LOW (ref 36.0–46.0)
Hemoglobin: 10.3 g/dL — ABNORMAL LOW (ref 12.0–15.0)

## 2021-07-31 LAB — MAGNESIUM: Magnesium: 2 mg/dL (ref 1.7–2.4)

## 2021-07-31 LAB — PROTIME-INR
INR: 1.3 — ABNORMAL HIGH (ref 0.8–1.2)
Prothrombin Time: 15.9 seconds — ABNORMAL HIGH (ref 11.4–15.2)

## 2021-07-31 LAB — ANA: Anti Nuclear Antibody (ANA): NEGATIVE

## 2021-07-31 LAB — CERULOPLASMIN: Ceruloplasmin: 15.7 mg/dL — ABNORMAL LOW (ref 19.0–39.0)

## 2021-07-31 LAB — ALPHA-1-ANTITRYPSIN: A-1 Antitrypsin, Ser: 181 mg/dL (ref 101–187)

## 2021-07-31 LAB — IGG: IgG (Immunoglobin G), Serum: 1325 mg/dL (ref 586–1602)

## 2021-07-31 LAB — HEPATITIS B SURFACE ANTIBODY, QUANTITATIVE: Hep B S AB Quant (Post): <3.1 m[IU]/mL — ABNORMAL LOW (ref >9.9)

## 2021-07-31 MED ORDER — SODIUM CHLORIDE 0.9% IV SOLUTION: Freq: Once | INTRAVENOUS | Status: DC

## 2021-07-31 MED ORDER — METOPROLOL TARTRATE 12.5 MG HALF TABLET
12.5000 mg | ORAL_TABLET | Freq: Two times a day (BID) | ORAL | Status: DC
  Administered 2021-07-31 – 2021-08-04 (×9): 12.5 mg via ORAL
  Filled 2021-07-31 (×10): qty 1

## 2021-07-31 MED ORDER — POTASSIUM CHLORIDE CRYS ER 20 MEQ PO TBCR
40.0000 meq | EXTENDED_RELEASE_TABLET | Freq: Once | ORAL | Status: AC
Start: 2021-07-31 — End: 2021-07-31
  Administered 2021-07-31: 40 meq via ORAL
  Filled 2021-07-31: qty 2

## 2021-07-31 MED ORDER — SODIUM CHLORIDE 0.9% IV SOLUTION: Freq: Once | INTRAVENOUS | Status: AC

## 2021-07-31 MED ORDER — FUROSEMIDE 10 MG/ML IJ SOLN
INTRAMUSCULAR | Status: AC
  Administered 2021-07-31: 20 mg via INTRAVENOUS
  Filled 2021-07-31: qty 2

## 2021-07-31 MED ORDER — NICOTINE 14 MG/24HR TD PT24
14.0000 mg | MEDICATED_PATCH | Freq: Every day | TRANSDERMAL | Status: DC
  Administered 2021-07-31 – 2021-08-04 (×5): 14 mg via TRANSDERMAL
  Filled 2021-07-31 (×5): qty 1

## 2021-07-31 MED ORDER — DIPHENHYDRAMINE HCL 25 MG PO CAPS
25.0000 mg | ORAL_CAPSULE | Freq: Once | ORAL | Status: AC
  Administered 2021-07-31: 25 mg via ORAL
  Filled 2021-07-31: qty 1

## 2021-07-31 MED ORDER — FUROSEMIDE 10 MG/ML IJ SOLN
20.0000 mg | Freq: Once | INTRAMUSCULAR | Status: AC
  Administered 2021-07-31: 20 mg via INTRAVENOUS

## 2021-07-31 MED ORDER — FUROSEMIDE 10 MG/ML IJ SOLN
20.0000 mg | Freq: Once | INTRAMUSCULAR | Status: AC
  Filled 2021-07-31: qty 2

## 2021-07-31 MED ORDER — ACETAMINOPHEN 325 MG PO TABS
650.0000 mg | ORAL_TABLET | Freq: Once | ORAL | Status: AC
  Administered 2021-07-31: 650 mg via ORAL
  Filled 2021-07-31: qty 2

## 2021-07-31 MED ORDER — DEXTROSE 5 % IV SOLN
1.0000 mg | Freq: Once | INTRAVENOUS | Status: AC
  Administered 2021-07-31: 1 mg via INTRAVENOUS
  Filled 2021-07-31: qty 2.5

## 2021-07-31 NOTE — Consult Note (Signed)
? ?  [facility] CM Inpatient Consult ? ? ?07/31/2021 ? ?Karisha Marlin ?04-15-1961 ?[id number] ? ?[facility] Customer service manager [THN]  Occupational hygienist [ACO] Patient: Laura [name] ? ?Primary Care Provider:  Sharon Seller, NP, [facility], is listed to provide the [facility] calls and follow up for post hospital ? ? ?Patient screened for less than 30 days readmission hospitalization and to assess for potential [employer] Customer service manager  [THN] Care Management service needs for post hospital transition.   ? ?Plan:  Continue to follow progress and disposition to assess for post hospital care management needs.   ? ?For questions contact:  ? ?Charlesetta Shanks, RN BSN CCM ?[employer] [employer] Liaison ? [phone] business mobile phone ?Toll free office [phone]  ?Fax number: [phone] ?[email or web address] ?[email or web address]  ?  ? ?

## 2021-07-31 NOTE — Progress Notes (Addendum)
? ? ? ?[facility] Progress Note ? ?CC:   Elevated LFTs ? ?Subjective: He denies having any abdominal pain.  No nausea or vomiting.  He passed a bowel movement earlier today which he stated looked like the food he ate.  No obvious bright red blood per the rectum or black stools. ? ? ?Objective:  ?Vital signs in last 24 hours: ?Temp:  [97.5 ?F (36.4 ?C)-98.4 ?F (36.9 ?C)] 97.5 ?F (36.4 ?C) (04/13 1100) ?Pulse Rate:  [75-93] 81 (04/13 1030) ?Resp:  [18-20] 20 (04/13 1100) ?BP: (98-129)/(65-82) 129/81 (04/13 1100) ?SpO2:  [91 %-100 %] 100 % (04/13 1100) ?Weight:  [74.9 kg] 74.9 kg (04/13 0539) ?Last BM Date : 07/29/21 ? ?General: Alert 61 year old patient in no acute distress. ?Eyes: No scleral icterus.  Purulent drainage to lower eyelids bilaterally. ?Heart: Regular rate and rhythm, no murmurs. ?Pulm: Breath sounds clear throughout. ?Abdomen: Obese abdomen, nondistended.  Nontender.  Hepatomegaly.  No palpable mass.  Positive bowel sounds to all 4 quadrants.  Lower abdominal skin fold with erythema and areas of superficial skin tearing likely due to candidiasis, small amount of purulent drainage concerning for secondary bacterial infection to this area. ?Extremities:  Without edema. ?Neurologic:  Alert and  oriented x 4. Grossly normal neurologically. ?Psych:  Alert and cooperative. Normal mood and affect. ? ?Intake/Output from previous day: ?04/12 0701 - 04/13 0700 ?In: 270.3 [P.O.:240; I.V.:30.3] ?Out: 50 [Urine:50] ?Intake/Output this shift: ?Total I/O ?In: 360 [P.O.:360] ?Out: 300 [Urine:300] ? ?Lab Results: ?Recent Labs  ?  07/29/21 ?2105 07/30/21 ?0627 07/30/21 ?1734 07/31/21 ?0314  ?WBC 11.1* 10.4  --  10.3  ?HGB 6.9* 7.9* 7.6* 6.6*  ?HCT 20.5* 24.1* 21.8* 18.7*  ?PLT 143* 108*  --  94*  ? ?BMET ?Recent Labs  ?  07/29/21 ?2105 07/30/21 ?0627 07/30/21 ?1914 07/31/21 ?0156  ?NA 136 135  --  133*  ?K 3.0* 3.2* 3.5 3.6  ?CL 93* 95*  --  96*  ?CO2 30 29  --  28  ?GLUCOSE 103* 93  --  91  ?BUN 8 9  --   6  ?CREATININE 0.73 0.69  --  0.52  ?CALCIUM 7.3* 7.4*  --  7.4*  ? ?LFT ?Recent Labs  ?  07/31/21 ?0156  ?PROT 4.9*  ?ALBUMIN 1.8*  ?AST 46*  ?ALT 22  ?ALKPHOS 197*  ?BILITOT 5.2*  ?BILIDIR 2.7*  ?IBILI 2.5*  ? ?PT/INR ?Recent Labs  ?  07/30/21 ?0627 07/31/21 ?0314  ?LABPROT 15.0 15.9*  ?INR 1.2 1.3*  ? ?Hepatitis Panel ?Recent Labs  ?  07/30/21 ?1100  ?HEPBSAG NON REACTIVE  ?HCVAB NON REACTIVE  ? ? ?DG Chest 2 View ? ?Result Date: 07/29/2021 ?CLINICAL DATA:  Edema. EXAM: CHEST - 2 VIEW COMPARISON:  07/20/2021 FINDINGS: Lower lung volumes from prior exam. Minor bibasilar atelectasis. Stable heart size and mediastinal contours. No pulmonary edema. No confluent consolidation. No pleural effusion. No pneumothorax. No acute osseous findings. IMPRESSION: Low lung volumes with minor bibasilar atelectasis. Electronically Signed   By: Narda RutherfordMelanie  Sanford M.D.   On: 07/29/2021 18:35  ? ?MR ABDOMEN MRCP WO CONTRAST ? ?Result Date: 07/30/2021 ?CLINICAL DATA:  61 year old female with history of fatty liver. Elevated total bilirubin alkaline phosphatase, concerning for potential biliary obstruction. Increasing shortness of breath and edema. EXAM: MRI ABDOMEN WITHOUT CONTRAST  (INCLUDING MRCP) TECHNIQUE: Multiplanar multisequence MR imaging of the abdomen was performed. Heavily T2-weighted images of the biliary and pancreatic ducts were obtained, and three-dimensional MRCP images were rendered by post  processing. COMPARISON:  No prior abdominal MRI. CT the abdomen and pelvis 06/29/2021. FINDINGS: Comment: Today's study is limited for detection and characterization of visceral and/or vascular lesions by lack of IV gadolinium. Lower chest: Unremarkable. Hepatobiliary: Liver is enlarged measuring 23.6 cm in craniocaudal span. Severe diffuse loss of signal intensity throughout the hepatic parenchyma on out of phase dual echo images, indicative of severe hepatic steatosis. No discrete cystic or solid hepatic lesions are confidently  identified on today's noncontrast examination. No intra or extrahepatic biliary ductal dilatation is noted on MRCP images. Common bile duct measures 6 mm in the porta hepatis. No filling defects within the common bile duct to suggest choledocholithiasis. Gallbladder is normal in appearance. Pancreas: No definite pancreatic mass noted on today's noncontrast no pancreatic ductal dilatation. No pancreatic or peripancreatic fluid collections or inflammatory changes. Spleen:  Unremarkable. Adrenals/Urinary Tract: Bilateral kidneys and bilateral adrenal glands are normal in appearance on today's noncontrast examination. Stomach/Bowel: Examination.  Visualized portions are unremarkable. Vascular/Lymphatic: No aneurysm identified in the visualized abdominal vasculature. No lymphadenopathy noted in the abdomen. Other:  Trace volume of ascites. Musculoskeletal: No aggressive appearing osseous lesions are noted in the visualized portions of the skeleton. IMPRESSION: 1. No findings to suggest biliary tract obstruction. 2. Severe hepatomegaly with severe hepatic steatosis. 3. Trace volume of ascites. Electronically Signed   By: Trudie Reed M.D.   On: 07/30/2021 05:14  ? ?ECHOCARDIOGRAM COMPLETE ? ?Result Date: 07/30/2021 ?   ECHOCARDIOGRAM REPORT   Patient Name:   ARSEMA TUSING Date of Exam: 07/30/2021 Medical Rec #:  [id number]  Height:       65.0 in Accession #:    [id number] Weight:       189.6 lb Date of Birth:  Oct 15, 1960  BSA:          1.934 m? Patient Age:    60 years   BP:           109/73 mmHg Patient Gender: F          HR:           89 bpm. Exam Location:  Inpatient Procedure: 2D Echo, Cardiac Doppler and Color Doppler Indications:    Abnormal EKG  History:        Patient has no prior history of Echocardiogram examinations.                 Risk Factors:Hypertension.  Sonographer:    Cleatis Polka Referring Phys: [id number] DANIEL V THOMPSON IMPRESSIONS  1. Left ventricular ejection fraction, by estimation, is 60 to 65%. The  left ventricle has normal function. The left ventricle has no regional wall motion abnormalities. Left ventricular diastolic parameters are consistent with Grade I diastolic dysfunction (impaired relaxation).  2. Right ventricular systolic function is normal. The right ventricular size is normal. Tricuspid regurgitation signal is inadequate for assessing PA pressure.  3. The mitral valve is normal in structure. Trivial mitral valve regurgitation. No evidence of mitral stenosis.  4. The aortic valve is normal in structure. Aortic valve regurgitation is not visualized. No aortic stenosis is present.  5. The inferior vena cava is normal in size with greater than 50% respiratory variability, suggesting right atrial pressure of 3 mmHg. FINDINGS  Left Ventricle: Left ventricular ejection fraction, by estimation, is 60 to 65%. The left ventricle has normal function. The left ventricle has no regional wall motion abnormalities. The left ventricular internal cavity size was normal in size. There is  no left ventricular hypertrophy. Left ventricular  diastolic parameters are consistent with Grade I diastolic dysfunction (impaired relaxation). Normal left ventricular filling pressure. Right Ventricle: The right ventricular size is normal. No increase in right ventricular wall thickness. Right ventricular systolic function is normal. Tricuspid regurgitation signal is inadequate for assessing PA pressure. Left Atrium: Left atrial size was normal in size. Right Atrium: Right atrial size was normal in size. Pericardium: There is no evidence of pericardial effusion. Mitral Valve: The mitral valve is normal in structure. Trivial mitral valve regurgitation. No evidence of mitral valve stenosis. Tricuspid Valve: The tricuspid valve is normal in structure. Tricuspid valve regurgitation is trivial. No evidence of tricuspid stenosis. Aortic Valve: The aortic valve is normal in structure. Aortic valve regurgitation is not visualized. No  aortic stenosis is present. Aortic valve peak gradient measures 5.9 mmHg. Pulmonic Valve: The pulmonic valve was normal in structure. Pulmonic valve regurgitation is trivial. No evidence of pulmonic stenosis. Ao

## 2021-07-31 NOTE — Evaluation (Signed)
Physical Therapy Evaluation ?Patient Details ?Name: Laura [name] ?MRN: [id number] ?DOB: [birth year] ?Today's Date: 07/31/2021 ? ?History of Present Illness ? Pt is a 61 y.o. who presented 07/29/21 with SOB and edema. Per chart, on arrival to ED, pt had intermittent SVT. Pt admitted with sympotmatic anemia, hypotension, UTI, and SVT likely secondary to electrolyte derangement in the setting of symptomatic anemia. PMH: HTN, chronic back pain, insomnia ?  ?Clinical Impression ? Pt presents with condition above and deficits mentioned below, see PT Problem List. PTA, he was living with a friend in a 1-level apartment with a level entry. Pt became upset and declined to answer specific questions about PLOF, reporting he has recently needed "help with everything" and "I'm sure" to when asked if he has had any recent falls. Pt reported he uses a cane or RW for mobility, but a few months ago was only using a cane, suggestive of a gradual decline in function recently. Currently, pt displays deficits in balance, strength, and activity tolerance. He is at risk for falls and was limited in mobility by his elevated HR up to 167 this date. After several minutes reclined, pt's HR did improve to 70-80s though. He required min guard assist for transfers and gait with a RW, needing cues to remain more proximal to the RW for improved safety. Will continue to follow acutely. Recommending follow-up with HHPT to address his deficits to reduce his burden of care and improve his independence with functional mobility and to reduce his risk for falls.   ?   ? ?Recommendations for follow up therapy are one component of a multi-disciplinary discharge planning process, led by the attending physician.  Recommendations may be updated based on patient status, additional functional criteria and insurance authorization. ? ?Follow Up Recommendations Home health PT ? ?  ?Assistance Recommended at Discharge Intermittent Supervision/Assistance  ?Patient can  return home with the following ? A little help with walking and/or transfers;A little help with bathing/dressing/bathroom;Assistance with cooking/housework;Assist for transportation ? ?  ?Equipment Recommendations Other (comment) (tub bench)  ?Recommendations for Other Services ?    ?  ?Functional Status Assessment Patient has had a recent decline in their functional status and demonstrates the ability to make significant improvements in function in a reasonable and predictable amount of time.  ? ?  ?Precautions / Restrictions Precautions ?Precautions: Fall;Other (comment) ?Precaution Comments: watch HR ?Restrictions ?Weight Bearing Restrictions: No  ? ?  ? ?Mobility ? Bed Mobility ?Overal bed mobility: Modified Independent ?  ?  ?  ?  ?  ?  ?General bed mobility comments: Pt able to transition supine > sit L EOB with HOB slightly elevated with slight increase in time. ?  ? ?Transfers ?Overall transfer level: Needs assistance ?Equipment used: Rolling walker (2 wheels) ?Transfers: Sit to/from Stand ?Sit to Stand: Min guard ?  ?  ?  ?  ?  ?General transfer comment: Pt requesting friend's assistance but educated PT needs to assess pt's need for assistance. Able to come to stand with min guard for safety. ?  ? ?Ambulation/Gait ?Ambulation/Gait assistance: Min guard ?Gait Distance (Feet): 160 Feet ?Assistive device: Rolling walker (2 wheels) ?Gait Pattern/deviations: Step-through pattern, Decreased stride length, Trunk flexed ?Gait velocity: reduced ?Gait velocity interpretation: <1.8 ft/sec, indicate of risk for recurrent falls ?  ?General Gait Details: Pt with slow, mostly steady gait but ambulates with flexed posture and RW distal to body, needing cues to correct. No LOB, min guard for safety ? ?Stairs ?  ?  ?  ?  ?  ? ?  Wheelchair Mobility ?  ? ?Modified Rankin (Stroke Patients Only) ?  ? ?  ? ?Balance Overall balance assessment: Needs assistance ?Sitting-balance support: No upper extremity supported, Feet  supported ?Sitting balance-Leahy Scale: Fair ?Sitting balance - Comments: Supervision sitting statically EOB ?  ?Standing balance support: Bilateral upper extremity supported, During functional activity, Reliant on assistive device for balance ?Standing balance-Leahy Scale: Poor ?Standing balance comment: Reliant on RW ?  ?  ?  ?  ?  ?  ?  ?  ?  ?  ?  ?   ? ? ? ?Pertinent Vitals/Pain Pain Assessment ?Pain Assessment: Faces ?Faces Pain Scale: No hurt ?Pain Intervention(s): Monitored during session  ? ? ?Home Living Family/patient expects to be discharged to:: Private residence ?Living Arrangements: Non-relatives/Friends ?Available Help at Discharge: Friend(s);Available 24 hours/day ?Type of Home: Apartment ?Home Access: Level entry ?  ?  ?  ?Home Layout: One level ?Home Equipment: Kasandra Knudsen - single point;Shower seat;Grab bars - Statistician (2 wheels) ?   ?  ?Prior Function Prior Level of Function : Needs assist;Driving ?  ?  ?  ?  ?  ?  ?Mobility Comments: Pt alternating between using cane and RW, not specifying when he uses one over the other as pt became agitated with specific questions and also reported "I'm sure" when asked if he has had any recent falls. Pt reporting "I needed help for everything", but would not specify with what when asked. ?ADLs Comments: Pt reporting "I needed help for everything", but would not specify with what when asked. Avoids the tub/shower due to fear of safe access. ?  ? ? ?Hand Dominance  ?   ? ?  ?Extremity/Trunk Assessment  ? Upper Extremity Assessment ?Upper Extremity Assessment: Defer to OT evaluation ?  ? ?Lower Extremity Assessment ?Lower Extremity Assessment: Generalized weakness (reports hx of neuropathy; edema noted bil) ?  ? ?Cervical / Trunk Assessment ?Cervical / Trunk Assessment: Other exceptions ?Cervical / Trunk Exceptions: increased body habitus  ?Communication  ? Communication: No difficulties  ?Cognition Arousal/Alertness: Awake/alert ?Behavior During  Therapy: Flat affect ?Overall Cognitive Status: Within Functional Limits for tasks assessed ?  ?  ?  ?  ?  ?  ?  ?  ?  ?  ?  ?  ?  ?  ?  ?  ?General Comments: Pt not appreciative of questions about PLOF and home set-up, not comprehending the importance of understanding pt's prior function to compare current functional status to. ?  ?  ? ?  ?General Comments General comments (skin integrity, edema, etc.): HR up to 167, thus cued to sit, HR remained 150s for several min sitting in recliner thus planned to return pt to bed but then HR improved rapidly to 70-80s ? ?  ?Exercises    ? ?Assessment/Plan  ?  ?PT Assessment Patient needs continued PT services  ?PT Problem List Decreased strength;Decreased activity tolerance;Decreased balance;Decreased mobility;Decreased knowledge of use of DME;Cardiopulmonary status limiting activity;Impaired sensation;Obesity ? ?   ?  ?PT Treatment Interventions DME instruction;Gait training;Functional mobility training;Therapeutic activities;Therapeutic exercise;Balance training;Neuromuscular re-education;Patient/family education   ? ?PT Goals (Current goals can be found in the Care Plan section)  ?Acute Rehab PT Goals ?Patient Stated Goal: to get OOB ?PT Goal Formulation: With patient/family ?Time For Goal Achievement: 08/14/21 ?Potential to Achieve Goals: Good ? ?  ?Frequency Min 3X/week ?  ? ? ?Co-evaluation   ?  ?  ?  ?  ? ? ?  ?AM-PAC PT "6 Clicks" Mobility  ?  Outcome Measure Help needed turning from your back to your side while in a flat bed without using bedrails?: None ?Help needed moving from lying on your back to sitting on the side of a flat bed without using bedrails?: None ?Help needed moving to and from a bed to a chair (including a wheelchair)?: A Little ?Help needed standing up from a chair using your arms (e.g., wheelchair or bedside chair)?: A Little ?Help needed to walk in hospital room?: A Little ?Help needed climbing 3-5 steps with a railing? : A Little ?6 Click Score:  20 ? ?  ?End of Session Equipment Utilized During Treatment: Gait belt ?Activity Tolerance: Other (comment) (limited by elevated HR) ?Patient left: in chair;with call bell/phone within reach;with family/visitor present ?

## 2021-07-31 NOTE — Progress Notes (Signed)
Hemoglobin 6.6, provider notified. ?

## 2021-07-31 NOTE — Progress Notes (Signed)
Seeking clarity on order for copper chloride IVPB in dextrose. Attempted to reach out to onocology Dr. Shirline Frees. ?

## 2021-07-31 NOTE — Progress Notes (Signed)
?PROGRESS NOTE ? ? ? Laura [name]  FXT:[id number] DOB: [birth year] DOA: 07/29/2021 ?PCP: Lauree Chandler, NP  ? ? ?Chief Complaint  ?Patient presents with  ? Edema  ? ? ?Brief Narrative:  ?H&P per Dr. Flossie Buffy ?Laura [name] is a 61 y.o. adult female who identifies as female with medical history significant of hypertension who presents with concerns of increasing shortness of breath and edema. ?  ?Patient is a limited historian and not able to provide a good timeline.  He reports feeling short of breath with ambulation for some time and also has noted edema of his legs and hands.  Denies any chest pain or palpitation.  He was admitted mid March with hypotension, dehydration with electrolyte derangements from chronic diarrhea which he says is now resolved.  He also required transfusion after hemoglobin downward trended from 7.8-5.8 with hydration at that time. ?  ?On arrival to the ED, he was afebrile and had intermittent SVT with heart rate up to 170 with mild hypotension with SBP of 95 on room air. ?Mom mild leukocytosis of 11.1, hemoglobin of 6.9 from a prior of 9.1 last week.  Potassium of 3, magnesium is 0.9, corrected calcium of 8.3. ?He was noted to have mildly elevated AST of 44, total bilirubin has trended up from 3.1-5.1.  Alk phos also elevated at 244.  He denies any abdominal pain or symptoms. ?  ?Recent CT of the abdomen pelvis on 3/12 showed severe hepatic steatosis and hepatomegaly but no biliary obstruction. ?  ?Chest x-ray showed no pulmonary edema or infiltrate. ? ?Patient transfused 1 unit packed red blood cells. ?GI consultation obtained.  ? ? ?Assessment & Plan: ? Principal Problem: ?  Symptomatic anemia ?Active Problems: ?  Hypotension ?  Serum total bilirubin elevated ?  Hypokalemia ?  Hypomagnesemia ?  Elevated alkaline phosphatase level ?  SVT (supraventricular tachycardia) ([place]) ?  Hypocalcemia ?  Edema ?  UTI (urinary tract infection) ?  Folate deficiency ? ? ? ?Assessment and Plan: ?* Symptomatic  anemia ?Hgb of 6.9 down from 9.1just last week on presentation. ? Required 2upRBC transfusion in last admission mid-March after he presented with hypotension and had downward trending Hgb following fluid resuscitation.   ?-He also had chronic diarrhea at that time and underwent colonoscopy and EGD on 07/01/2021 that was unremarkable.  FOBT x2 was negative at that time.  ?- He was noted to have folate deficiency and was started on supplementation at home. ?-Status post transfusion 1 unit packed red blood cells on admission with hemoglobin of 6.6 this morning.  ?-Patient with no overt bleeding.  ?-Transfused 2 more units packed red blood cells. ?-Repeat H&H posttransfusion.   ?-Patient with elevated bilirubin, MCV of 106.6, indirect bilirubin of 3.5.   ?-LDH of 219.  Haptoglobin pending.  Absolute reticulocyte count of 37.9.  Per hematology peripheral smear with no schistocytes noted.   ?-Ceruloplasmin level decreased at 15.7. ?-Patient seen in consultation by gastroenterology who are recommended hematology input.   ?-Hematology consult pending.   ?-Patient seen in consultation by hematology who are following and if patient does have hemolytic anemia are recommending probable initiation of steroids. ?-Transfusion threshold hemoglobin < 7. ?-Continue folic acid 1 mg daily ?-GI and hematology following and appreciate input and recommendations. ?- ? ?Hypotension ?SBP 95 and improved with transfusion. ?Albumin of 2.3. ?Continue IV albumin twice daily x2 days. ? ? ?Serum total bilirubin elevated ?Total bilirubin of 5.4 on admission, which has trended up from 3.1 several weeks  ago.   ?-Total bilirubin fluctuated was 5.4 on admission and up to 6.5 then up to 8.0 and back down to 5.2 this morning.  Alk phosphatase levels elevated but trending down. ?- recently had a CT abdomen and pelvis on 3/12 that showed severe hepatic steatosis and hepatomegaly but no biliary dilatation.  ?-MRCP done with no findings to suggest biliary  tract obstruction, severe hepatomegaly with severe hepatic steatosis, trace volume ascites.  ?-Bilirubin fluctuating trending back down currently at 5.2 from 8.0, indirect and direct bilirubin levels also slowly trending back down.  AST of 46. ?-Haptoglobin pending.  LDH of 219.  Ceruloplasmin level of 15.7. ?-IgG serum of 1325.  Alpha 1 antitrypsin of 181. ?-GI consulted and following and following..  ? ?Folate deficiency ?- Folic acid 1 mg IV daily and can transition to oral folic acid in the next 1 to 2 days. ? ?UTI (urinary tract infection) ?Urine culture with > 100,000 colonies of GNR. ?-Continue IV ciprofloxacin. ? ?Edema ?Possible third spacing or potential hepatic etiology. BNP is low and no CXR finding of edema to suggest cardiac cause.  ?Unable to diuresis while receiving blood transfusion  ?-2D echo with normal EF,NWMA, grade 1 diastolic dysfunction. ?-Continue IV albumin twice daily x2 days. ?-Patient to be given Lasix in between transfusions of packed red blood cells. ?-Follow. ? ? ? ?Hypocalcemia ?Corrected Ca of 8.3.  ?-Repleted with IV Calcium gluconate.  ?-Check a vitamin D 25-hydroxy level. ?-Repeat labs in the AM. ? ?SVT (supraventricular tachycardia) ([place]) ?Patient having intermittent nonsustained SVT likely secondary to electrolyte derangement in the setting of symptomatic anemia. Unclear cause of low electrolytes since she is not having any GI loss or renal loss. ?-Patient with a 40 beat run of SVT this morning however remained asymptomatic. ?-Magnesium admission 0.9 >>>1.2>>>> 2.0, potassium at 3.0>>> 3.2>>>> 3.5>>> 3.6.  ?-Give a dose of K-Dur 40 mEq p.o. x1 with goal to keep potassium approximately 4, magnesium approximately 2.  ?-2D echo done with normal EF,NWMA, grade 1 diastolic dysfunction. ?-Patient being transfused 2 units packed red blood cells with hemoglobin of 6.6 this morning. ?-Start low-dose Lopressor 12.5 mg twice daily.  ?-Consult cardiology for further evaluation and  management.  ?-Continue to monitor on continuous telemetry ? ?Elevated alkaline phosphatase level ?- See elevated bilirubin above. ?-Per GI. ? ?Hypomagnesemia ?Mg of 0.9 on admission. ?-Magnesium repleted. ?-Follow. ? ?Hypokalemia ?K of 3 on admission. ?-Potassium at 3.6 this morning ?-Kdur 40 mEq x1.  ? ? ? ? ?  ? ? ?DVT prophylaxis: SCDs ?Code Status: Full ?Family Communication: Updated patient and significant other at bedside. ?Disposition: Likely home once cleared by GI, hematology, cardiology ? ?Status is: Inpatient ?Remains inpatient appropriate because: Severity of illness ?  ?Consultants:  ?Gastroenterology: Dr. Candis Schatz 07/30/2021 ?Hematology: Dr. Lorna Few 07/30/2021 ? ? ?Procedures:  ?Transfusion 1 unit packed red blood cells 07/29/2021 ?CT abdomen and pelvis 06/29/2021 ?Chest x-ray 07/29/2021 ?MRCP abdomen 07/30/2021 ?Transfused 2 units packed red blood cells 07/31/2021 ?2D echo 07/30/2021 ? ? ? ?Antimicrobials:  ?IV ciprofloxacin 07/30/2021>>>>> ? ? ?Subjective: ?Sitting up in bed, just finished first transfusion of packed red blood cells, second transfusion of packed red blood cells being hung.  Patient states feels a little bit better, some improvement with shortness of breath, feeling somewhat groggy.no chest pain.  No abdominal pain.  Denies any overt bleeding.  Patient noted with a burst of SVT with 40 beat run around 9:41 AM today, however remained asymptomatic. ? ?Objective: ?Vitals:  ? 07/31/21  0013 07/31/21 0539 07/31/21 0610 07/31/21 1030  ?BP: 100/75 113/79 114/74 120/82  ?Pulse: 75  88 81  ?Resp: _0 ?Temp: 98.4 ?F (36.9 ?C) 98.3 ?F (36.8 ?C) 98.3 ?F (36.8 ?C) 98.2 ?F (36.8 ?C)  ?TempSrc: Oral Oral Oral Oral  ?SpO2: 98% 98% 93% 100%  ?Weight:  74.9 kg    ? ? ?Intake/Output Summary (Last 24 hours) at 07/31/2021 1059 ?Last data filed at 07/31/2021 4753 ?Gross per 24 hour  ?Intake 630.33 ml  ?Output 50 ml  ?Net 580.33 ml  ? ?Filed Weights  ? 07/31/21 0539  ?Weight: 74.9 kg   ? ? ?Examination: ? ?General exam: NAD. ?Respiratory system: CTA B.  No wheezes, no crackles, no rhonchi.  Normal respiratory effort.  Speaking in full sentences.   ?Cardiovascular system: Regular rate and rhythm no murmurs rub

## 2021-07-31 NOTE — Progress Notes (Signed)
Patient had a burst of tachycardia. I went to check on patient-HR in the 140s. She was sleeping and I woke her to ask if she was feeling ok and if she felt any palpitations. She stated "no, just leave me alone". I went to get the EKG machine to capture it and her rate is back WNL/SR. Provider notified. ?

## 2021-07-31 NOTE — TOC Progression Note (Addendum)
Transition of Care (TOC) - Progression Note  ? ? ?Patient Details  ?Name: Laura [name] ?MRN: [id number] ?Date of Birth: 09-30-1960 ? ?Transition of Care (TOC) CM/SW Contact  ?Leone Haven, RN ?Phone Number: ?07/31/2021, 4:16 PM ? ?Clinical Narrative:    ?Patient is from home with friends, her sister Lise Auer at bedside states she lives with her also in apartment.  She has rolling walker, shower seat. NCM offered choice, She is active with [facility] for HHPT, HHOT and would like to continue with them.  NCM confirmed with Kandee Keen with [facility].  She states she has Medicaid also and would like to get a tub bench. NCM made referral to [facility] with [facility].  She is getting one unit of prbc today, conts on IV lasix, has bil severe LE edema, hbg 6.6, conts on iv cipro, will need walking oxygen saturations.  Patient states her neighbor Malon Kindle will transport her home. TOC will continue to follow for dc needs.  Patient does not have Medicaid , and she does not want to pay for the tub bench so she does not want it.  She states she will need a BSC.  NCM made referral to [place] for [facility].  This will be delivered to patient's room. ? ? ?Expected Discharge Plan: Home w Home Health Services ?Barriers to Discharge: Continued Medical Work up ? ?Expected Discharge Plan and Services ?Expected Discharge Plan: Home w Home Health Services ?  ?Discharge Planning Services: CM Consult ?Post Acute Care Choice: Home Health, Durable Medical Equipment ?  ?                ?DME Arranged: Tub bench ?DME Agency: [employer] ?Date DME Agency Contacted: 07/31/21 ?Time DME Agency Contacted: [phone] ?Representative spoke with at DME Agency: kim ?HH Arranged: PT, OT ?HH Agency: [facility] ?Date HH Agency Contacted: 07/31/21 ?Time HH Agency Contacted: 1615 ?Representative spoke with at [facility] Agency: Kandee Keen ? ? ?Social Determinants of Health (SDOH) Interventions ?  ? ?Readmission Risk Interventions ? ?  07/02/2021  ? 11:32 AM  ?Readmission Risk  Prevention Plan  ?Post Dischage Appt Complete  ?Medication Screening Complete  ?Transportation Screening Complete  ? ? ?

## 2021-07-31 NOTE — Plan of Care (Signed)

## 2021-08-01 DIAGNOSIS — R609 Edema, unspecified: Secondary | ICD-10-CM | POA: Diagnosis not present

## 2021-08-01 DIAGNOSIS — R0602 Shortness of breath: Secondary | ICD-10-CM | POA: Diagnosis not present

## 2021-08-01 DIAGNOSIS — D649 Anemia, unspecified: Secondary | ICD-10-CM | POA: Diagnosis not present

## 2021-08-01 DIAGNOSIS — H1033 Unspecified acute conjunctivitis, bilateral: Secondary | ICD-10-CM

## 2021-08-01 DIAGNOSIS — H109 Unspecified conjunctivitis: Secondary | ICD-10-CM | POA: Diagnosis present

## 2021-08-01 DIAGNOSIS — L539 Erythematous condition, unspecified: Secondary | ICD-10-CM

## 2021-08-01 DIAGNOSIS — R748 Abnormal levels of other serum enzymes: Secondary | ICD-10-CM | POA: Diagnosis not present

## 2021-08-01 DIAGNOSIS — E559 Vitamin D deficiency, unspecified: Secondary | ICD-10-CM | POA: Diagnosis present

## 2021-08-01 LAB — MULTIPLE MYELOMA PANEL, SERUM
Albumin SerPl Elph-Mcnc: 2.5 g/dL — ABNORMAL LOW (ref 2.9–4.4)
Albumin/Glob SerPl: 0.8 (ref 0.7–1.7)
Alpha 1: 0.3 g/dL (ref 0.0–0.4)
Alpha2 Glob SerPl Elph-Mcnc: 0.4 g/dL (ref 0.4–1.0)
B-Globulin SerPl Elph-Mcnc: 1.5 g/dL — ABNORMAL HIGH (ref 0.7–1.3)
Gamma Glob SerPl Elph-Mcnc: 1.3 g/dL (ref 0.4–1.8)
Globulin, Total: 3.5 g/dL (ref 2.2–3.9)
IgA: 523 mg/dL — ABNORMAL HIGH (ref 87–352)
IgG (Immunoglobin G), Serum: 1290 mg/dL (ref 586–1602)
IgM (Immunoglobulin M), Srm: 223 mg/dL — ABNORMAL HIGH (ref 26–217)
Total Protein ELP: 6 g/dL (ref 6.0–8.5)

## 2021-08-01 LAB — CBC WITH DIFFERENTIAL/PLATELET
Abs Immature Granulocytes: 0.06 10*3/uL (ref 0.00–0.07)
Basophils Absolute: 0 10*3/uL (ref 0.0–0.1)
Basophils Relative: 0 %
Eosinophils Absolute: 0 10*3/uL (ref 0.0–0.5)
Eosinophils Relative: 1 %
HCT: 28.9 % — ABNORMAL LOW (ref 36.0–46.0)
Hemoglobin: 9.9 g/dL — ABNORMAL LOW (ref 12.0–15.0)
Immature Granulocytes: 1 %
Lymphocytes Relative: 8 %
Lymphs Abs: 0.6 10*3/uL — ABNORMAL LOW (ref 0.7–4.0)
MCH: 32.5 pg (ref 26.0–34.0)
MCHC: 34.3 g/dL (ref 30.0–36.0)
MCV: 94.8 fL (ref 80.0–100.0)
Monocytes Absolute: 0.4 10*3/uL (ref 0.1–1.0)
Monocytes Relative: 5 %
Neutro Abs: 6.4 10*3/uL (ref 1.7–7.7)
Neutrophils Relative %: 85 %
Platelets: 90 10*3/uL — ABNORMAL LOW (ref 150–400)
RBC: 3.05 MIL/uL — ABNORMAL LOW (ref 3.87–5.11)
RDW: 23.2 % — ABNORMAL HIGH (ref 11.5–15.5)
WBC: 7.4 10*3/uL (ref 4.0–10.5)
nRBC: 0 % (ref 0.0–0.2)

## 2021-08-01 LAB — BASIC METABOLIC PANEL
Anion gap: 8 (ref 5–15)
BUN: 9 mg/dL (ref 6–20)
CO2: 31 mmol/L (ref 22–32)
Calcium: 7.8 mg/dL — ABNORMAL LOW (ref 8.9–10.3)
Chloride: 91 mmol/L — ABNORMAL LOW (ref 98–111)
Creatinine, Ser: 0.66 mg/dL (ref 0.44–1.00)
GFR, Estimated: >60 mL/min (ref >60)
Glucose, Bld: 101 mg/dL — ABNORMAL HIGH (ref 70–99)
Potassium: 3.2 mmol/L — ABNORMAL LOW (ref 3.5–5.1)
Sodium: 130 mmol/L — ABNORMAL LOW (ref 135–145)

## 2021-08-01 LAB — HEPATIC FUNCTION PANEL
ALT: 22 U/L (ref 0–44)
AST: 51 U/L — ABNORMAL HIGH (ref 15–41)
Albumin: 2.3 g/dL — ABNORMAL LOW (ref 3.5–5.0)
Alkaline Phosphatase: 195 U/L — ABNORMAL HIGH (ref 38–126)
Bilirubin, Direct: 4.3 mg/dL — ABNORMAL HIGH (ref 0.0–0.2)
Indirect Bilirubin: 3.6 mg/dL — ABNORMAL HIGH (ref 0.3–0.9)
Total Bilirubin: 7.9 mg/dL — ABNORMAL HIGH (ref 0.3–1.2)
Total Protein: 5.6 g/dL — ABNORMAL LOW (ref 6.5–8.1)

## 2021-08-01 LAB — MISC LABCORP TEST (SEND OUT)
Labcorp test code: 3002598
Labcorp test code: 70412

## 2021-08-01 LAB — MAGNESIUM: Magnesium: 1.5 mg/dL — ABNORMAL LOW (ref 1.7–2.4)

## 2021-08-01 LAB — VITAMIN D 25 HYDROXY (VIT D DEFICIENCY, FRACTURES): Vit D, 25-Hydroxy: 11.42 ng/mL — ABNORMAL LOW (ref 30–100)

## 2021-08-01 MED ORDER — MAGNESIUM OXIDE -MG SUPPLEMENT 400 (240 MG) MG PO TABS
400.0000 mg | ORAL_TABLET | Freq: Two times a day (BID) | ORAL | Status: DC
  Administered 2021-08-02 – 2021-08-04 (×5): 400 mg via ORAL
  Filled 2021-08-01 (×5): qty 1

## 2021-08-01 MED ORDER — POTASSIUM CHLORIDE CRYS ER 20 MEQ PO TBCR
40.0000 meq | EXTENDED_RELEASE_TABLET | ORAL | Status: AC
  Administered 2021-08-01: 40 meq via ORAL
  Filled 2021-08-01: qty 2

## 2021-08-01 MED ORDER — VITAMIN D (ERGOCALCIFEROL) 1.25 MG (50000 UNIT) PO CAPS: 50000.0000 [IU] | ORAL_CAPSULE | ORAL | Status: DC

## 2021-08-01 MED ORDER — CIPROFLOXACIN HCL 0.3 % OP SOLN: 2.0000 [drp] | Freq: Four times a day (QID) | OPHTHALMIC | Status: DC

## 2021-08-01 MED ORDER — LIDOCAINE VISCOUS HCL 2 % MT SOLN
15.0000 mL | Freq: Once | OROMUCOSAL | Status: AC
  Administered 2021-08-01: 15 mL via ORAL
  Filled 2021-08-01: qty 15

## 2021-08-01 MED ORDER — PANTOPRAZOLE SODIUM 40 MG PO TBEC
40.0000 mg | DELAYED_RELEASE_TABLET | Freq: Two times a day (BID) | ORAL | Status: DC
  Administered 2021-08-01 – 2021-08-04 (×6): 40 mg via ORAL
  Filled 2021-08-01 (×6): qty 1

## 2021-08-01 MED ORDER — MAGNESIUM SULFATE 4 GM/100ML IV SOLN
4.0000 g | Freq: Once | INTRAVENOUS | Status: AC
  Administered 2021-08-01: 4 g via INTRAVENOUS
  Filled 2021-08-01: qty 100

## 2021-08-01 MED ORDER — ALUM & MAG HYDROXIDE-SIMETH 200-200-20 MG/5ML PO SUSP
30.0000 mL | Freq: Once | ORAL | Status: AC
  Administered 2021-08-01: 30 mL via ORAL
  Filled 2021-08-01: qty 30

## 2021-08-01 MED ORDER — CIPROFLOXACIN HCL 0.3 % OP SOLN
2.0000 [drp] | Freq: Four times a day (QID) | OPHTHALMIC | Status: DC
  Administered 2021-08-01 – 2021-08-03 (×8): 2 [drp] via OPHTHALMIC
  Filled 2021-08-01: qty 2.5

## 2021-08-01 MED ORDER — DOXYCYCLINE HYCLATE 100 MG PO TABS
100.0000 mg | ORAL_TABLET | Freq: Two times a day (BID) | ORAL | Status: DC
  Administered 2021-08-01 – 2021-08-03 (×4): 100 mg via ORAL
  Filled 2021-08-01 (×4): qty 1

## 2021-08-01 MED ORDER — CIPROFLOXACIN HCL 500 MG PO TABS
500.0000 mg | ORAL_TABLET | Freq: Two times a day (BID) | ORAL | Status: DC
  Administered 2021-08-02 – 2021-08-03 (×3): 500 mg via ORAL
  Filled 2021-08-01 (×5): qty 1

## 2021-08-01 MED ORDER — NYSTATIN 100000 UNIT/GM EX POWD
Freq: Three times a day (TID) | CUTANEOUS | Status: DC
  Filled 2021-08-01: qty 15

## 2021-08-01 MED ORDER — ALUM & MAG HYDROXIDE-SIMETH 200-200-20 MG/5ML PO SUSP
15.0000 mL | ORAL | Status: DC | PRN
  Administered 2021-08-01: 15 mL via ORAL
  Filled 2021-08-01: qty 30

## 2021-08-01 NOTE — Evaluation (Signed)
Occupational Therapy Evaluation ?Patient Details ?Name: Laura KiefKathy [name] ?MRN: [id number] ?DOB: May 11, 1960 ?Today's Date: 08/01/2021 ? ? ?History of Present Illness Pt is a 61 y.o. who presented 07/29/21 with SOB and edema. Per chart, on arrival to ED, pt had intermittent SVT. Pt admitted with sympotmatic anemia, hypotension, UTI, and SVT likely secondary to electrolyte derangement in the setting of symptomatic anemia. PMH: HTN, chronic back pain, insomnia  ? ?Clinical Impression ?  ?Prior to this admission, patient living with a friend who helps with ADLs, does not drive (recently gave it up two months ago due to weakness and fatigue) and is able to manage medication. Patient endorses fairly sedentary lifestyle. Patient currently requiring increased assist with lower body dressing due to significant edema, but able to complete peri-care with one hand propped on sink in standing (would benefit from adaptive equipment for lower body dressing). Patient able to complete functional mobility at min guard level with minimal fatigue reported. OT recommending HHOT in order to promote increased activity tolerance and independence at discharge. OT will continue to follow acutely to address functional deficits outlined below.  ?   ? ?Recommendations for follow up therapy are one component of a multi-disciplinary discharge planning process, led by the attending physician.  Recommendations may be updated based on patient status, additional functional criteria and insurance authorization.  ? ?Follow Up Recommendations ? Home health OT  ?  ?Assistance Recommended at Discharge Intermittent Supervision/Assistance  ?Patient can return home with the following A little help with walking and/or transfers;A little help with bathing/dressing/bathroom;Assistance with cooking/housework;Assist for transportation;Help with stairs or ramp for entrance ? ?  ?Functional Status Assessment ? Patient has had a recent decline in their functional status and  demonstrates the ability to make significant improvements in function in a reasonable and predictable amount of time.  ?Equipment Recommendations ? None recommended by OT (Patient has DME needed)  ?  ?Recommendations for Other Services   ? ? ?  ?Precautions / Restrictions Precautions ?Precautions: Fall;Other (comment) ?Precaution Comments: watch HR ?Restrictions ?Weight Bearing Restrictions: No  ? ?  ? ?Mobility Bed Mobility ?Overal bed mobility: Modified Independent ?  ?  ?  ?  ?  ?  ?General bed mobility comments: Extra time needed to bring legs to EOB ?  ? ?Transfers ?Overall transfer level: Needs assistance ?Equipment used: Rolling walker (2 wheels) ?Transfers: Sit to/from Stand ?Sit to Stand: Min guard ?  ?  ?  ?  ?  ?General transfer comment: Patient stating he needed help, but good power up noted with minimal bed elevation, min gaurd soley for safety ?  ? ?  ?Balance Overall balance assessment: Needs assistance ?Sitting-balance support: No upper extremity supported, Feet supported ?Sitting balance-Leahy Scale: Fair ?Sitting balance - Comments: Supervision sitting statically EOB ?  ?Standing balance support: Bilateral upper extremity supported, During functional activity, Reliant on assistive device for balance ?Standing balance-Leahy Scale: Poor ?Standing balance comment: Reliant on RW ?  ?  ?  ?  ?  ?  ?  ?  ?  ?  ?  ?   ? ?ADL either performed or assessed with clinical judgement  ? ?ADL Overall ADL's : Needs assistance/impaired ?Eating/Feeding: Set up;Sitting ?  ?Grooming: Wash/dry hands;Wash/dry face;Oral care;Min guard;Standing ?Grooming Details (indicate cue type and reason): bilteral UEs propped on countertop ?Upper Body Bathing: Set up;Sitting ?  ?Lower Body Bathing: Minimal assistance;Sit to/from stand;Sitting/lateral leans ?Lower Body Bathing Details (indicate cue type and reason): min A for throughness, able to complete  peri-care in standing at sink ?Upper Body Dressing : Set up;Sitting ?  ?Lower  Body Dressing: Maximal assistance;Sitting/lateral leans;Sit to/from stand ?Lower Body Dressing Details (indicate cue type and reason): unable to complete due to body habitus and edema ?Toilet Transfer: Min guard;Rolling walker (2 wheels) ?Toilet Transfer Details (indicate cue type and reason): simluated with ambulation in hallway and room ?  ?  ?  ?  ?Functional mobility during ADLs: Minimal assistance;Cueing for safety;Cueing for sequencing;Rolling walker (2 wheels) ?General ADL Comments: Patient presenting with decreased activity tolerance, and flat affect  ? ? ? ?Vision Baseline Vision/History: 0 No visual deficits ?Ability to See in Adequate Light: 0 Adequate ?Patient Visual Report: No change from baseline ?   ?   ?Perception   ?  ?Praxis   ?  ? ?Pertinent Vitals/Pain Pain Assessment ?Pain Assessment: Faces ?Faces Pain Scale: Hurts little more ?Pain Location: Generalized ?Pain Descriptors / Indicators: Discomfort, Guarding, Grimacing ?Pain Intervention(s): Limited activity within patient's tolerance, Monitored during session, Premedicated before session, Repositioned  ? ? ? ?Hand Dominance   ?  ?Extremity/Trunk Assessment Upper Extremity Assessment ?Upper Extremity Assessment: Generalized weakness ?  ?Lower Extremity Assessment ?Lower Extremity Assessment: Defer to PT evaluation ?  ?Cervical / Trunk Assessment ?Cervical / Trunk Assessment: Other exceptions ?Cervical / Trunk Exceptions: increased body habitus ?  ?Communication Communication ?Communication: No difficulties ?  ?Cognition Arousal/Alertness: Awake/alert ?Behavior During Therapy: Flat affect ?Overall Cognitive Status: Within Functional Limits for tasks assessed ?  ?  ?  ?  ?  ?  ?  ?  ?  ?  ?  ?  ?  ?  ?  ?  ?General Comments: Patient more participatory in session, but is not conversant, will answer basic questions ?  ?  ?General Comments    ? ?  ?Exercises   ?  ?Shoulder Instructions    ? ? ?Home Living Family/patient expects to be discharged to::  Private residence ?Living Arrangements: Non-relatives/Friends ?Available Help at Discharge: Friend(s);Available 24 hours/day ?Type of Home: Apartment ?Home Access: Level entry ?  ?  ?Home Layout: One level ?  ?  ?Bathroom Shower/Tub: Tub/shower unit ?  ?Bathroom Toilet: Standard ?  ?  ?Home Equipment: Gilmer Mor - single point;Shower seat;Grab bars - Chartered loss adjuster (2 wheels) ?  ?Additional Comments: apparently was to be getting a BSC but has not shown up yet ?  ? ?  ?Prior Functioning/Environment Prior Level of Function : Needs assist;Driving ?  ?  ?  ?  ?  ?  ?Mobility Comments: Patient using a RW and cane for mobility, stopped driving two months ago, but wants to attempt when he is feeling stronger ?ADLs Comments: Has assist from friend for ADLs, especially lower body dressing ?  ? ?  ?  ?OT Problem List: Decreased strength;Decreased activity tolerance;Impaired balance (sitting and/or standing);Decreased coordination;Decreased safety awareness;Decreased knowledge of use of DME or AE;Decreased knowledge of precautions;Cardiopulmonary status limiting activity;Obesity;Increased edema;Pain ?  ?   ?OT Treatment/Interventions: Self-care/ADL training;Therapeutic exercise;Energy conservation;DME and/or AE instruction;Manual therapy;Therapeutic activities;Patient/family education;Balance training  ?  ?OT Goals(Current goals can be found in the care plan section) Acute Rehab OT Goals ?Patient Stated Goal: to get stronger ?OT Goal Formulation: With patient ?Time For Goal Achievement: 08/15/21 ?Potential to Achieve Goals: Fair ?ADL Goals ?Pt Will Perform Lower Body Bathing: Independently;with adaptive equipment;sitting/lateral leans;sit to/from stand ?Pt Will Perform Lower Body Dressing: Independently;with adaptive equipment;sitting/lateral leans;sit to/from stand ?Pt Will Transfer to Toilet: Independently;ambulating ?Pt/caregiver will Perform Home Exercise Program: Increased  strength;Both right and left upper  extremity;With theraband;Independently;With written HEP provided  ?OT Frequency: Min 2X/week ?  ? ?Co-evaluation   ?  ?  ?  ?  ? ?  ?AM-PAC OT "6 Clicks" Daily Activity     ?Outcome Measure Help from another person

## 2021-08-01 NOTE — Progress Notes (Addendum)
? ? ? ?[facility] Progress Note ? ?CC:   Elevated LFTs ? ?Subjective: No complaints today.  No nausea or vomiting.  No abdominal pain.  She passed 1 bowel movement but she did not look at it. ? ?Objective:  ?Vital signs in last 24 hours: ?Temp:  [97.7 ?F (36.5 ?C)-98.5 ?F (36.9 ?C)] 98.5 ?F (36.9 ?C) (04/14 0351) ?Pulse Rate:  [70-86] 70 (04/14 0351) ?Resp:  [18] 18 (04/14 0351) ?BP: (91-117)/(55-80) 91/55 (04/14 0351) ?SpO2:  [96 %-99 %] 96 % (04/14 0351) ?Weight:  [76.9 kg] 76.9 kg (04/14 0351) ?Last BM Date : 07/29/21 ? ?General: Alert 61 year old patient in no acute distress. ?Heart: Regular rate and rhythm, no murmurs ?Pulm: Coarse breath sounds left upper lobe otherwise diminished throughout. ?Abdomen: Soft, nontender.  Positive bowel sounds all 4 quadrants. ?Extremities:  B/l lower extremity edema. ?Neurologic:  Alert and  oriented x 4. Grossly normal neurologically. ?Psych:  Alert and cooperative. Normal mood and affect. ? ?Intake/Output from previous day: ?04/13 0701 - 04/14 0700 ?In: 2115 [P.O.:840; KTGYB:6389; IV Piggyback:31] ?Out: 300 [Urine:300] ?Intake/Output this shift: ?Total I/O ?In: 220 [P.O.:220] ?Out: -  ? ?Lab Results: ?Recent Labs  ?  07/30/21 ?0627 07/30/21 ?3734 07/31/21 ?0314 07/31/21 ?1525 08/01/21 ?0354  ?WBC 10.4  --  10.3  --  7.4  ?HGB 7.9*   < > 6.6* 10.3* 9.9*  ?HCT 24.1*   < > 18.7* 29.1* 28.9*  ?PLT 108*  --  94*  --  90*  ? < > = values in this interval not displayed.  ? ?BMET ?Recent Labs  ?  07/30/21 ?0627 07/30/21 ?1914 07/31/21 ?0156 08/01/21 ?0354  ?NA 135  --  133* 130*  ?K 3.2* 3.5 3.6 3.2*  ?CL 95*  --  96* 91*  ?CO2 29  --  28 31  ?GLUCOSE 93  --  91 101*  ?BUN 9  --  6 9  ?CREATININE 0.69  --  0.52 0.66  ?CALCIUM 7.4*  --  7.4* 7.8*  ? ?LFT ?Recent Labs  ?  08/01/21 ?0354  ?PROT 5.6*  ?ALBUMIN 2.3*  ?AST 51*  ?ALT 22  ?ALKPHOS 195*  ?BILITOT 7.9*  ?BILIDIR 4.3*  ?IBILI 3.6*  ? ?PT/INR ?Recent Labs  ?  07/30/21 ?0627 07/31/21 ?0314  ?LABPROT 15.0 15.9*   ?INR 1.2 1.3*  ? ?Hepatitis Panel ?Recent Labs  ?  07/30/21 ?1100  ?HEPBSAG NON REACTIVE  ?HCVAB NON REACTIVE  ? ? ?ECHOCARDIOGRAM COMPLETE ? ?Result Date: 07/30/2021 ?   ECHOCARDIOGRAM REPORT   Patient Name:   Laura [name] Date of Exam: 07/30/2021 Medical Rec #:  [id number]  Height:       65.0 in Accession #:    [id number] Weight:       189.6 lb Date of Birth:  1961/04/05  BSA:          1.934 m? Patient Age:    61 years   BP:           109/73 mmHg Patient Gender: F          HR:           89 bpm. Exam Location:  Inpatient Procedure: 2D Echo, Cardiac Doppler and Color Doppler Indications:    Abnormal EKG  History:        Patient has no prior history of Echocardiogram examinations.                 Risk Factors:Hypertension.  Sonographer:    Jyl Heinz  Referring Phys: [place]  1. Left ventricular ejection fraction, by estimation, is 60 to 65%. The left ventricle has normal function. The left ventricle has no regional wall motion abnormalities. Left ventricular diastolic parameters are consistent with Grade I diastolic dysfunction (impaired relaxation).  2. Right ventricular systolic function is normal. The right ventricular size is normal. Tricuspid regurgitation signal is inadequate for assessing PA pressure.  3. The mitral valve is normal in structure. Trivial mitral valve regurgitation. No evidence of mitral stenosis.  4. The aortic valve is normal in structure. Aortic valve regurgitation is not visualized. No aortic stenosis is present.  5. The inferior vena cava is normal in size with greater than 50% respiratory variability, suggesting right atrial pressure of 3 mmHg. FINDINGS  Left Ventricle: Left ventricular ejection fraction, by estimation, is 60 to 65%. The left ventricle has normal function. The left ventricle has no regional wall motion abnormalities. The left ventricular internal cavity size was normal in size. There is  no left ventricular hypertrophy. Left ventricular diastolic  parameters are consistent with Grade I diastolic dysfunction (impaired relaxation). Normal left ventricular filling pressure. Right Ventricle: The right ventricular size is normal. No increase in right ventricular wall thickness. Right ventricular systolic function is normal. Tricuspid regurgitation signal is inadequate for assessing PA pressure. Left Atrium: Left atrial size was normal in size. Right Atrium: Right atrial size was normal in size. Pericardium: There is no evidence of pericardial effusion. Mitral Valve: The mitral valve is normal in structure. Trivial mitral valve regurgitation. No evidence of mitral valve stenosis. Tricuspid Valve: The tricuspid valve is normal in structure. Tricuspid valve regurgitation is trivial. No evidence of tricuspid stenosis. Aortic Valve: The aortic valve is normal in structure. Aortic valve regurgitation is not visualized. No aortic stenosis is present. Aortic valve peak gradient measures 5.9 mmHg. Pulmonic Valve: The pulmonic valve was normal in structure. Pulmonic valve regurgitation is trivial. No evidence of pulmonic stenosis. Aorta: The aortic root is normal in size and structure. Venous: The inferior vena cava is normal in size with greater than 50% respiratory variability, suggesting right atrial pressure of 3 mmHg. IAS/Shunts: No atrial level shunt detected by color flow Doppler.  LEFT VENTRICLE PLAX 2D LVIDd:         3.90 cm     Diastology LVIDs:         2.90 cm     LV e' medial:    6.09 cm/s LV PW:         1.00 cm     LV E/e' medial:  7.9 LV IVS:        1.00 cm     LV e' lateral:   8.16 cm/s LVOT diam:     2.00 cm     LV E/e' lateral: 5.9 LV SV:         56 LV SV Index:   29 LVOT Area:     3.14 cm?  LV Volumes (MOD) LV vol d, MOD A2C: 63.9 ml LV vol d, MOD A4C: 74.6 ml LV vol s, MOD A2C: 25.3 ml LV vol s, MOD A4C: 31.8 ml LV SV MOD A2C:     38.6 ml LV SV MOD A4C:     74.6 ml LV SV MOD BP:      40.9 ml RIGHT VENTRICLE             IVC RV Basal diam:  2.90 cm     IVC  diam: 1.30 cm RV  Mid diam:    2.30 cm RV S prime:     10.10 cm/s TAPSE (M-mode): 2.0 cm LEFT ATRIUM             Index        RIGHT ATRIUM           Index LA diam:        3.00 cm 1.55 cm/m?   RA Area:     10.50 cm? LA Vol (A2C):   21.6 ml 11.17 ml/m?  RA Volume:   20.50 ml  10.60 ml/m? LA Vol (A4C):   23.9 ml 12.36 ml/m? LA Biplane Vol: 22.9 ml 11.84 ml/m?  AORTIC VALVE AV Area (Vmax): 2.65 cm? AV Vmax:        121.00 cm/s AV Peak Grad:   5.9 mmHg LVOT Vmax:      102.00 cm/s LVOT Vmean:     71.100 cm/s LVOT VTI:       0.179 m  AORTA Ao Root diam: 3.00 cm Ao Asc diam:  3.10 cm MITRAL VALVE MV Area (PHT): 4.15 cm?    SHUNTS MV Decel Time: 183 msec    Systemic VTI:  0.18 m MV E velocity: 48.40 cm/s  Systemic Diam: 2.00 cm MV A velocity: 66.00 cm/s MV E/A ratio:  0.73 Fransico Him MD Electronically signed by Fransico Him MD Signature Date/Time: 07/30/2021/2:31:25 PM    Final    ? ?Assessment / Plan: ? ?1) [name] is 61 year old female who identifies herself as a female who was admitted to the hospital with elevated total bili and alk phos levels.  CTAP without contrast 06/29/2021 identified severe hepatic steatosis with hepatomegaly without evidence of biliary ductal dilatation.  Abdominal MRI/MRCP without contrast showed severe hepatic steatosis with severe hepatomegaly without evidence of biliary obstruction and trace ascites noted. Likely has cirrhosis, query alcohol associated liver disease.  Peth and CDT  levels pending. Hep A, B and C serologies were nonreactive.  GGT 167. A1AT 181. IgG 1325. ANA negative. AMA and SMA pending. Ceruloplasmin level low at 15.7.  Elevated ferritin and saturation ratios elevated. HFE mutations pending.  Mother died from liver cancer. ?-24-hour urinary copper collection to rule out Wilson's disease started 4/13, will need to be aborted as patient received IV copper chloride 33m x 1 yesterday evening  per hematology. I checked with our pharmacist, no data on IV copper half life, biological  half life for dietary copper is 13 to 33 days ?-May require eventual liver biopsy ?-Eventual liver elastography as an outpatient ?-Avoid nephrotoxic medication ?-Continue to trend LFTs ?-Await further recomme

## 2021-08-01 NOTE — Assessment & Plan Note (Addendum)
-   Patient with erythema with some skin tears with small amount of purulent drainage noted on lower abdominal wall fold extending into the mons pubis and groin region concerning for possible candidiasis secondary bacterial process.  Also noted to be malodorous. ?-Some clinical improvement. ?-Patient was on ciprofloxacin and doxycycline added as well as placed on nystatin powder to the area.   ?-Cipro and doxycycline was subsequently changed and patient placed on Bactrim for 5 more days to complete a 10-day course of antibiotic treatment.   ?-Patient also given IV Diflucan 200 mg x 1 and will be discharged home on 6 more days of oral Diflucan.   ?-Patient seen by [place] RN who recommended Interdry to fit in skin folds.  ?-Outpatient follow-up with PCP.  ?

## 2021-08-01 NOTE — Progress Notes (Signed)
OT Cancellation Note ? ?Patient Details ?Name: Laura [name] ?MRN: [id number] ?DOB: [birth year] ? ? ?Cancelled Treatment:    Reason Eval/Treat Not Completed: Patient declined, no reason specified Patient eating breakfast and wanting to take pain medication prior to evaluation. OT will follow up as time allows.  ? ?Pollyann Glen E. Janesha Brissette, OTR/L ?Acute Rehabilitation Services ?[phone] ?[phone]  ? ?Pollyann Glen Abrial Arrighi ?08/01/2021, 9:09 AM ?
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
## 2021-08-01 NOTE — Care Management Important Message (Signed)
Important Message ? ?Patient Details  ?Name: Laura [name] ?MRN: [id number] ?Date of Birth: [birth year] ? ? ?Medicare Important Message Given:  Yes ? ? ? ? ?Renie Ora ?08/01/2021, 11:16 AM ?

## 2021-08-01 NOTE — Progress Notes (Signed)
I reviewed the lab of this patient today.  There is no evidence for hemolytic anemia.  The patient has folate deficiency as well as low ceruloplasmin.  She received folic acid IV and now orally.  She also received copper supplement. ?Her anemia is likely anemia of chronic disease secondary to multiple medical issues including liver disease. ?Continue supportive care for now. ?I will sign off.  Please call if you have any questions. ?  ?

## 2021-08-01 NOTE — Progress Notes (Signed)
?PROGRESS NOTE ? ? ? Laura [name]  DSK:[id number] DOB: 07-Mar-1961 DOA: 07/29/2021 ?PCP: Lauree Chandler, NP  ? ? ?Chief Complaint  ?Patient presents with  ? Edema  ? ? ?Brief Narrative:  ?H&P per Dr. Flossie Buffy ?Laura [name] is a 61 y.o. adult female who identifies as female with medical history significant of hypertension who presents with concerns of increasing shortness of breath and edema. ?  ?Patient is a limited historian and not able to provide a good timeline.  He reports feeling short of breath with ambulation for some time and also has noted edema of his legs and hands.  Denies any chest pain or palpitation.  He was admitted mid March with hypotension, dehydration with electrolyte derangements from chronic diarrhea which he says is now resolved.  He also required transfusion after hemoglobin downward trended from 7.8-5.8 with hydration at that time. ?  ?On arrival to the ED, he was afebrile and had intermittent SVT with heart rate up to 170 with mild hypotension with SBP of 95 on room air. ?Mom mild leukocytosis of 11.1, hemoglobin of 6.9 from a prior of 9.1 last week.  Potassium of 3, magnesium is 0.9, corrected calcium of 8.3. ?He was noted to have mildly elevated AST of 44, total bilirubin has trended up from 3.1-5.1.  Alk phos also elevated at 244.  He denies any abdominal pain or symptoms. ?  ?Recent CT of the abdomen pelvis on 3/12 showed severe hepatic steatosis and hepatomegaly but no biliary obstruction. ?  ?Chest x-ray showed no pulmonary edema or infiltrate. ? ?Patient transfused 1 unit packed red blood cells. ?GI consultation obtained.  ? ? ?Assessment & Plan: ? Principal Problem: ?  Symptomatic anemia ?Active Problems: ?  Hypotension ?  Serum total bilirubin elevated ?  Hypokalemia ?  Hypomagnesemia ?  Elevated alkaline phosphatase level ?  SVT (supraventricular tachycardia) ([place]) ?  Hypocalcemia ?  Edema ?  UTI (urinary tract infection) ?  Folate deficiency ?  Conjunctivitis ?  Vitamin D deficiency ?   Erythema of abdominal wall ? ? ? ?Assessment and Plan: ?* Symptomatic anemia ?Hgb of 6.9 down from 9.1just last week on presentation. ? Required 2upRBC transfusion in last admission mid-March after he presented with hypotension and had downward trending Hgb following fluid resuscitation.   ?-He also had chronic diarrhea at that time and underwent colonoscopy and EGD on 07/01/2021 that was unremarkable.  FOBT x2 was negative at that time.  ?- He was noted to have folate deficiency and was started on supplementation at home. ?-Status post transfusion 1 unit packed red blood cells on admission with hemoglobin of 6.6 (07/31/2021). ?-Status post transfusion of 2 units packed red blood cells 07/31/2021 with hemoglobin currently at 9.9 this morning.  ?-Patient with no overt bleeding. ?-Patient with elevated bilirubin, MCV of 106.6, indirect bilirubin of 3.5.   ?-LDH of 219.  Haptoglobin within normal limits of 69.  Absolute reticulocyte count of 37.9.  Per hematology peripheral smear with no schistocytes noted.   ?-Ceruloplasmin level decreased at 15.7. ?-Status post IV copper 07/31/2021. ?-Patient seen in consultation by gastroenterology who are recommended hematology input.   ?-Patient seen in consultation by hematology who are following, and reviewed patient's labs and per note no evidence of hemolytic anemia.  ?-Per hematology patient with folate deficiency as well as low ceruloplasmin, patient on folic acid as well as received copper supplementation and feel patient's anemia is likely secondary to anemia of chronic disease secondary to multiple medical issues  including liver disease.  ?-Hematology recommending supportive care and has signed off as of 08/01/2021.  ?-Transfusion threshold hemoglobin < 7. ?-Continue folic acid 1 mg daily ?-GI following and appreciate input and recommendations. ?- ? ?Hypotension ?SBP 95 and improved with transfusion. ?Systolic blood pressure this morning in the 120s. ?Albumin of 2.3. ?Continue  IV albumin twice daily x2 days. ? ? ?Serum total bilirubin elevated ?Total bilirubin of 5.4 on admission, which has trended up from 3.1 several weeks ago.   ?-Total bilirubin fluctuated was 5.4 >>>> 6.5 >>>> 8.0>>> 5.2>>> 7.9. ?-  Alk phosphatase levels elevated but trending down. ?- recently had a CT abdomen and pelvis on 3/12 that showed severe hepatic steatosis and hepatomegaly but no biliary dilatation.  ?-MRCP done with no findings to suggest biliary tract obstruction, severe hepatomegaly with severe hepatic steatosis, trace volume ascites.  ?-Bilirubin fluctuating trending back down currently at 7.9 from 5.2 from 8.0, indirect and direct bilirubin levels fluctuating. AST of 46. ?-Haptoglobin within normal limits..  LDH of 219.  Ceruloplasmin level of 15.7. ?-Patient received a dose of copper yesterday. ?-IgG serum of 1325.  Alpha 1 antitrypsin of 181.  ANA negative. ?-24-hour urine copper study pending to evaluate for Wilson's disease per GI, hemochromatosis evaluation also pending per GI. ?-GI consulted and following.  ? ?Erythema of abdominal wall ?- Patient with erythema with some skin tears with small amount of purulent drainage noted on lower abdominal wall fold extending into the mons pubis and groin region concerning for possible candidiasis secondary bacterial process.  Also noted to be malodorous. ?-Patient currently on IV ciprofloxacin for UTI. ?-Placed on nystatin powder to the region. ?-Start oral doxycycline in addition. ?-Follow-up. ? ?Vitamin D deficiency ?Vitamin D level of 11.42. ?Vitamin D supplementation with vitamin D 3 50,000 units weekly. ?-Outpatient follow-up with PCP ? ?Conjunctivitis ?- cipro eye drops QID x 7 days. ? ?Folate deficiency ?- Folic acid 1 mg IV daily and can transition to oral folic acid in the next 1 to 2 days. ? ?UTI (urinary tract infection) ?Urine culture with > 100,000 colonies of Proteus Mirabella's and Klebsiella pneumonia with sensitivities pending.  ?-Continue  IV ciprofloxacin. ? ?Edema ?Possible third spacing or potential hepatic etiology. BNP is low and no CXR finding of edema to suggest cardiac cause.  ?Unable to diuresis while receiving blood transfusion  ?-2D echo with normal EF,NWMA, grade 1 diastolic dysfunction. ?-Continue IV albumin twice daily x2 days. ?-Patient given Lasix in between transfusions of packed red blood cells. ?-Follow. ? ? ? ?Hypocalcemia ?Corrected Ca of 8.3.  ?-Repleted with IV Calcium gluconate.  ?-Patient with vitamin D deficiency and being placed on oral vitamin D supplementation.  ?-Repeat labs in the AM. ? ?SVT (supraventricular tachycardia) ([place]) ?Patient having intermittent nonsustained SVT likely secondary to electrolyte derangement in the setting of symptomatic anemia. Unclear cause of low electrolytes since she is not having any GI loss or renal loss. ?-Patient with a 40 beat run of SVT in the morning of 07/31/2021 however remained asymptomatic.  ?-No SVT noted on telemetry this morning. ?-Magnesium admission 0.9 >>>1.2>>>> 2.0>>>> 1.5, potassium at 3.0>>> 3.2>>>> 3.5>>> 3.6>>>> 3.2.  ?-Give a dose of K-Dur 40 mEq p.o every 4 hours x 2 doses, with goal to keep potassium approximately 4, magnesium approximately 2.  ?-Magnesium sulfate 4 g IV x1 and start oral magnesium supplementation tomorrow. ?-2D echo done with normal EF,NWMA, grade 1 diastolic dysfunction. ?-Patient status post total transfusion 3 units packed red blood cells with hemoglobin  at 9.9 this morning.  ?-Continue low-dose Lopressor 12.5 mg twice daily.  ?-Consult cardiology for further evaluation and management.  ?-Continue to monitor on continuous telemetry ? ?Elevated alkaline phosphatase level ?- See elevated bilirubin above. ?-Per GI. ? ?Hypomagnesemia ?Mg of 0.9 on admission. ?-Magnesium back down to 1.5. ?-Magnesium sulfate 4 g IV x1. ?-Placed on oral magnesium supplementation starting tomorrow 08/02/2021. ?-Follow. ? ?Hypokalemia ?K of 3 on admission. ?-Potassium at  3.2 this morning ?-Kdur 40 mEq every 4 hours x 2 doses  ? ? ? ? ?  ? ? ?DVT prophylaxis: SCDs ?Code Status: Full ?Family Communication: Updated patient and significant other at bedside. ?Disposition: Nicoletta Dress

## 2021-08-01 NOTE — Plan of Care (Signed)
  Problem: Clinical Measurements: Goal: Respiratory complications will improve Outcome: Progressing Goal: Cardiovascular complication will be avoided Outcome: Progressing   Problem: Activity: Goal: Risk for activity intolerance will decrease Outcome: Progressing   Problem: Coping: Goal: Level of anxiety will decrease Outcome: Progressing   Problem: Pain Managment: Goal: General experience of comfort will improve Outcome: Progressing   Problem: Safety: Goal: Ability to remain free from injury will improve Outcome: Progressing   

## 2021-08-01 NOTE — TOC Progression Note (Signed)
Transition of Care (TOC) - Progression Note  ? ? ?Patient Details  ?Name: Allyana Vogan ?MRN: [id number] ?Date of Birth: Aug 22, 1960 ? ?Transition of Care (TOC) CM/SW Contact  ?Leone Haven, RN ?Phone Number: ?08/01/2021, 4:49 PM ? ?Clinical Narrative:    ?Patient is not functionally able to ambulate to bathroom.  ? ? ?Expected Discharge Plan: Home w Home Health Services ?Barriers to Discharge: Continued Medical Work up ? ?Expected Discharge Plan and Services ?Expected Discharge Plan: Home w Home Health Services ?  ?Discharge Planning Services: CM Consult ?Post Acute Care Choice: Home Health, Durable Medical Equipment ?  ?                ?DME Arranged: Tub bench ?DME Agency: [employer] ?Date DME Agency Contacted: 07/31/21 ?Time DME Agency Contacted: [phone] ?Representative spoke with at DME Agency: kim ?HH Arranged: PT, OT ?HH Agency: [facility] ?Date HH Agency Contacted: 07/31/21 ?Time HH Agency Contacted: 1615 ?Representative spoke with at [facility] Agency: Kandee Keen ? ? ?Social Determinants of Health (SDOH) Interventions ?  ? ?Readmission Risk Interventions ? ?  07/02/2021  ? 11:32 AM  ?Readmission Risk Prevention Plan  ?Post Dischage Appt Complete  ?Medication Screening Complete  ?Transportation Screening Complete  ? ? ?

## 2021-08-01 NOTE — Progress Notes (Signed)
PHARMACIST - PHYSICIAN COMMUNICATION ? ? ?CONCERNING: IV to Oral Route Change Policy ? ?RECOMMENDATION: ?This patient is receiving Cipro by the intravenous route.  Based on criteria approved by the Pharmacy and Therapeutics Committee, the intravenous medication(s) is/are being converted to the equivalent oral dose form(s). ? ? ?DESCRIPTION: ?These criteria include: ?The patient is eating (either orally or via tube) and/or has been taking other orally administered medications for a least 24 hours ?The patient has no evidence of active gastrointestinal bleeding or impaired GI absorption (gastrectomy, short bowel, patient on TNA or NPO). ? ?If you have questions about this conversion, please contact the Pharmacy Department  ?[]   [phone] )  [facility] ?[]   [phone] )  [facility] ?[x]   [phone] )  [name] ?[]   [phone] )  [facility] ?[]   [phone] )  [facility]  ? ?Hildred Laser, PharmD ?Clinical Pharmacist ?**Pharmacist phone directory can now be found on [email or web address] ([email or web address]).  Listed under [place]. ? ?  ?

## 2021-08-01 NOTE — Assessment & Plan Note (Addendum)
-   Patient placed on ciprofloxacin eyedrops during the hospitalization with discharge home to complete a course of treatment.   ?

## 2021-08-01 NOTE — Assessment & Plan Note (Signed)
Vitamin D level of 11.42. ?Vitamin D supplementation with vitamin D 3 50,000 units weekly. ?-Outpatient follow-up with PCP ?

## 2021-08-01 NOTE — Progress Notes (Signed)
Pt was ordered Cipro eye drops for eye relief. And 24 hour urine collection was discontinued today. Patient finally got another IV placed in right forearm to aid in administering several IV medications that included albumin, cipro, lasix, magnesium. Rates of magnesium and cipro were decreased due to patient discomfort with burning at the IV site. ?

## 2021-08-01 NOTE — Progress Notes (Signed)
Mobility Specialist Progress Note: ? ? 08/01/21 1525  ?Mobility  ?Activity Ambulated with assistance in hallway  ?Level of Assistance Minimal assist, patient does 75% or more  ?Assistive Device Front wheel walker  ?Distance Ambulated (ft) 100 ft  ?Activity Response Tolerated well  ?$Mobility charge 1 Mobility  ? ?Pt agreeable to mobility session at this time. Required minA to stand, minG during gait. Pt back in bed with all needs met.  ? ?Laura [name] ?Acute Rehab ?Phone: [phone] ?Office Phone: [phone] ? ?

## 2021-08-02 LAB — BPAM RBC
Blood Product Expiration Date: 202305052359
Blood Product Expiration Date: 202305052359
Blood Product Expiration Date: 202305062359
Blood Product Expiration Date: 202305082359
ISSUE DATE / TIME: 202304100943
ISSUE DATE / TIME: 202304112336
ISSUE DATE / TIME: 202304130533
ISSUE DATE / TIME: 202304130952
Unit Type and Rh: 5100
Unit Type and Rh: 5100
Unit Type and Rh: 5100
Unit Type and Rh: 5100

## 2021-08-02 LAB — BASIC METABOLIC PANEL
Anion gap: 8 (ref 5–15)
BUN: 7 mg/dL (ref 6–20)
CO2: 28 mmol/L (ref 22–32)
Calcium: 8.2 mg/dL — ABNORMAL LOW (ref 8.9–10.3)
Chloride: 96 mmol/L — ABNORMAL LOW (ref 98–111)
Creatinine, Ser: 0.54 mg/dL (ref 0.44–1.00)
GFR, Estimated: >60 mL/min (ref >60)
Glucose, Bld: 80 mg/dL (ref 70–99)
Potassium: 4.1 mmol/L (ref 3.5–5.1)
Sodium: 132 mmol/L — ABNORMAL LOW (ref 135–145)

## 2021-08-02 LAB — URINE CULTURE: Culture: >=100000 — AB

## 2021-08-02 LAB — CBC WITH DIFFERENTIAL/PLATELET
Abs Immature Granulocytes: 0.07 10*3/uL (ref 0.00–0.07)
Basophils Absolute: 0.1 10*3/uL (ref 0.0–0.1)
Basophils Relative: 1 %
Eosinophils Absolute: 0 10*3/uL (ref 0.0–0.5)
Eosinophils Relative: 1 %
HCT: 23.7 % — ABNORMAL LOW (ref 36.0–46.0)
Hemoglobin: 8.2 g/dL — ABNORMAL LOW (ref 12.0–15.0)
Immature Granulocytes: 1 %
Lymphocytes Relative: 10 %
Lymphs Abs: 0.7 10*3/uL (ref 0.7–4.0)
MCH: 32.8 pg (ref 26.0–34.0)
MCHC: 34.6 g/dL (ref 30.0–36.0)
MCV: 94.8 fL (ref 80.0–100.0)
Monocytes Absolute: 0.4 10*3/uL (ref 0.1–1.0)
Monocytes Relative: 7 %
Neutro Abs: 5.3 10*3/uL (ref 1.7–7.7)
Neutrophils Relative %: 80 %
Platelets: 80 10*3/uL — ABNORMAL LOW (ref 150–400)
RBC: 2.5 MIL/uL — ABNORMAL LOW (ref 3.87–5.11)
RDW: 22.4 % — ABNORMAL HIGH (ref 11.5–15.5)
WBC: 6.5 10*3/uL (ref 4.0–10.5)
nRBC: 0 % (ref 0.0–0.2)

## 2021-08-02 LAB — HEPATIC FUNCTION PANEL
ALT: 19 U/L (ref 0–44)
AST: 42 U/L — ABNORMAL HIGH (ref 15–41)
Albumin: 2.4 g/dL — ABNORMAL LOW (ref 3.5–5.0)
Alkaline Phosphatase: 161 U/L — ABNORMAL HIGH (ref 38–126)
Bilirubin, Direct: 3 mg/dL — ABNORMAL HIGH (ref 0.0–0.2)
Indirect Bilirubin: 2.9 mg/dL — ABNORMAL HIGH (ref 0.3–0.9)
Total Bilirubin: 5.9 mg/dL — ABNORMAL HIGH (ref 0.3–1.2)
Total Protein: 5 g/dL — ABNORMAL LOW (ref 6.5–8.1)

## 2021-08-02 LAB — TYPE AND SCREEN
ABO/RH(D): O POS
Antibody Screen: NEGATIVE
Unit division: 0
Unit division: 0
Unit division: 0
Unit division: 0

## 2021-08-02 LAB — OCCULT BLOOD X 1 CARD TO LAB, STOOL: Fecal Occult Bld: NEGATIVE

## 2021-08-02 LAB — MAGNESIUM: Magnesium: 2.1 mg/dL (ref 1.7–2.4)

## 2021-08-02 MED ORDER — FOLIC ACID 1 MG PO TABS
1.0000 mg | ORAL_TABLET | Freq: Every day | ORAL | Status: DC
  Administered 2021-08-02 – 2021-08-04 (×3): 1 mg via ORAL
  Filled 2021-08-02 (×3): qty 1

## 2021-08-02 NOTE — Progress Notes (Signed)
Mobility Specialist Progress Note: ? ? 08/02/21 1230  ?Mobility  ?Activity Ambulated with assistance in hallway  ?Level of Assistance Standby assist, set-up cues, supervision of patient - no hands on  ?Assistive Device Front wheel walker  ?Distance Ambulated (ft) 300 ft  ?Activity Response Tolerated well  ?$Mobility charge 1 Mobility  ? ?Pt agreeable to mobility session. Required minA to stand from EOB, minG throughout gait. Pt back in bed with all needs met. ? ?Nelta Numbers ?Acute Rehab ?Phone: [phone] ?Office Phone: [phone] ? ?

## 2021-08-02 NOTE — Progress Notes (Addendum)
? ? ? ?[facility] Progress Note ? ?CC:   Elevated LFTs ? ?Subjective: He stated feeling better today. No N/V. No abdominal pain. He passed a BM after lunch today, he didn't look at her BM. Family member, [name], at the bedside emptied the commode and stated the patient's stool was green in color.  ? ? ?Objective:  ?Vital signs in last 24 hours: ?Temp:  [97.4 ?F (36.3 ?C)-97.9 ?F (36.6 ?C)] 97.9 ?F (36.6 ?C) (04/15 1208) ?Pulse Rate:  [71-78] 78 (04/15 1208) ?Resp:  [16-18] 18 (04/15 1208) ?BP: (107-119)/(70-77) 119/76 (04/15 1208) ?SpO2:  [94 %-97 %] 95 % (04/15 1208) ?Weight:  [77.4 kg] 77.4 kg (04/15 0411) ?Last BM Date : 08/01/21 ?General: Alert 61 year old patient in no acute distress. ?Heart: Regular rate and rhythm, no murmurs. ?Pulm: Breath sounds clear. ?Abdomen: Soft, nondistended.  Nontender.  Positive bowel sounds all 4 quadrants. ?Extremities: Minimal lower extremity edema. ?Neurologic:  Alert and  oriented x 4. Grossly normal neurologically. ?Psych:  Alert and cooperative. Normal mood and affect. ? ?Intake/Output from previous day: ?04/14 0701 - 04/15 0700 ?In: 982.7 [P.O.:820; I.V.:40; IV Piggyback:122.7] ?Out: 600 [Urine:600] ?Intake/Output this shift: ?Total I/O ?In: -  ?Out: 300 [Urine:300] ? ?Lab Results: ?Recent Labs  ?  07/31/21 ?0314 07/31/21 ?1525 08/01/21 ?0354 08/02/21 ?0352  ?WBC 10.3  --  7.4 6.5  ?HGB 6.6* 10.3* 9.9* 8.2*  ?HCT 18.7* 29.1* 28.9* 23.7*  ?PLT 94*  --  90* 80*  ? ?BMET ?Recent Labs  ?  07/31/21 ?0156 08/01/21 ?0354 08/02/21 ?0352  ?NA 133* 130* 132*  ?K 3.6 3.2* 4.1  ?CL 96* 91* 96*  ?CO2 _0 ?GLUCOSE 91 101* 80  ?BUN _1 ?CREATININE 0.52 0.66 0.54  ?CALCIUM 7.4* 7.8* 8.2*  ? ?LFT ?Recent Labs  ?  08/02/21 ?0352  ?PROT 5.0*  ?ALBUMIN 2.4*  ?AST 42*  ?ALT 19  ?ALKPHOS 161*  ?BILITOT 5.9*  ?BILIDIR 3.0*  ?IBILI 2.9*  ? ?PT/INR ?Recent Labs  ?  07/31/21 ?0314  ?LABPROT 15.9*  ?INR 1.3*  ? ?Hepatitis Panel ?No results for input(s): HEPBSAG, HCVAB, HEPAIGM,  HEPBIGM in the last 72 hours. ? ?No results found. ? ?Assessment / Plan: ? ?1) [name] is 61 year old female who identifies herself as a female who was admitted to the hospital with elevated total bili and alk phos levels.  CTAP without contrast 06/29/2021 identified severe hepatic steatosis with hepatomegaly without evidence of biliary ductal dilatation.  Abdominal MRI/MRCP without contrast showed severe hepatic steatosis with severe hepatomegaly without evidence of biliary obstruction and trace ascites noted. Likely has cirrhosis, query alcohol associated liver disease.  Peth and CDT  levels pending. Hep A, B and C serologies were nonreactive.  GGT 167. A1AT 181. IgG 1325. ANA negative. AMA and SMA pending. Ceruloplasmin level low at 15.7.  Elevated ferritin and saturation ratios elevated. HFE mutations pending.  T. Bili/LFTs drifting downward. Mother died from liver cancer. ?-24-hour urinary copper collection to rule out Wilson's disease aborted as patient received IV copper chloride 63m x 1 per hematology. I checked with our pharmacist, no data on IV copper half life, biological half life for dietary copper is 13 to 33 days ?-Repeat ceruloplasmin as an outpatient ?-If no obvious cause of liver disease identified, a liver biopsy would be warranted ?-Avoid nephrotoxic medication ?-Continue to trend LFTs ?-Defer discharge recommendations to Dr. CCandis Schatz ?-Follow up in our outpatient GI clinic 2 to 3 weeks post hospital discharge  ?  ?  2) Acute on chronic macrocytic anemia. Admission Hg 6.9 (Hg 9.1 on 4/2). Transfused 1 unit of PRBCs -> Hg 7.9 -> 7.6 -> Hg 6.6 -> transfused 1 unit of  PRBCs -> Hg 10.3 -> Hg 9.9  EGD and colonoscopy 07/01/2021 findings did not explain etiology for his anemia.  Folate deficiency on folic acid po. Iron 114.  TIBC 123.  Work-up by hematology negative for hemolytic anemia.  Unlikely GI bleed with negative FOBT on 06/30/2021, reported green stools and normal iron level. ?-FOBT ?-Transfuse as  needed  ?-Appreciate hematology evaluation recommendation ?-Consider small bowel capsule endoscopy if hemoglobin level significantly drops during this hospitalization ?-Nursing staff to document color of bowel movements  ?  ?3) Mild thrombocytopenia. PLT 80.  ?  ?4) New onset LE edema.  Echo with LV EF 60 to 65%.  Normal RV function.  Receiving IV albumin twice daily. ?  ?  ? ?Principal Problem: ?  Symptomatic anemia ?Active Problems: ?  Hypotension ?  Hypokalemia ?  Hypomagnesemia ?  Serum total bilirubin elevated ?  Elevated alkaline phosphatase level ?  SVT (supraventricular tachycardia) ([place]) ?  Hypocalcemia ?  Edema ?  UTI (urinary tract infection) ?  Folate deficiency ?  Conjunctivitis ?  Vitamin D deficiency ?  Erythema of abdominal wall ? ? ? ? LOS: 3 days  ? ?Noralyn Pick  08/02/2021, 12:41 PM ? ? ?I have taken a history, reviewed the chart and examined the patient. I performed a substantive portion of this encounter, including complete performance of at least one of the key components, in conjunction with the APP. I agree with the APP's note, impression and recommendations ? ?Patient's hemoglobin dropped again from 9.6 to 8.2.  Patient tells me that her stool was brown in color.  Again, GI bleed seems extremely unlikely with negative FOBT, no iron deficiency, normal EGD and colonoscopy and normal colored stools, but given recurrent drops in hemoglobin I think a repeat FOBT is reasonable.   ? ?Her liver enzymes are stable to improving.  Most of her liver test of returned, still awaiting AMA and ASMA titers.  We can follow-up on the results of these tests, repeat ceruloplasmin and consider liver biopsy as an outpatient. ? ?Halynn Reitano E. Candis Schatz, MD ?[facility] ? ?

## 2021-08-02 NOTE — Plan of Care (Signed)

## 2021-08-02 NOTE — Progress Notes (Signed)
?PROGRESS NOTE ? ? ? Laura [name]  ZOX:[id number] DOB: [birth year] DOA: 07/29/2021 ?PCP: Lauree Chandler, NP  ? ? ?Chief Complaint  ?Patient presents with  ? Edema  ? ? ?Brief Narrative:  ?H&P per Dr. Flossie Buffy ?Laura [name] is a 61 y.o. adult female who identifies as female with medical history significant of hypertension who presents with concerns of increasing shortness of breath and edema. ?  ?Patient is a limited historian and not able to provide a good timeline.  He reports feeling short of breath with ambulation for some time and also has noted edema of his legs and hands.  Denies any chest pain or palpitation.  He was admitted mid March with hypotension, dehydration with electrolyte derangements from chronic diarrhea which he says is now resolved.  He also required transfusion after hemoglobin downward trended from 7.8-5.8 with hydration at that time. ?  ?On arrival to the ED, he was afebrile and had intermittent SVT with heart rate up to 170 with mild hypotension with SBP of 95 on room air. ?Mom mild leukocytosis of 11.1, hemoglobin of 6.9 from a prior of 9.1 last week.  Potassium of 3, magnesium is 0.9, corrected calcium of 8.3. ?He was noted to have mildly elevated AST of 44, total bilirubin has trended up from 3.1-5.1.  Alk phos also elevated at 244.  He denies any abdominal pain or symptoms. ?  ?Recent CT of the abdomen pelvis on 3/12 showed severe hepatic steatosis and hepatomegaly but no biliary obstruction. ?  ?Chest x-ray showed no pulmonary edema or infiltrate. ? ?Patient transfused 1 unit packed red blood cells. ?GI consultation obtained.  ? ? ?Assessment & Plan: ? Principal Problem: ?  Symptomatic anemia ?Active Problems: ?  Hypotension ?  Serum total bilirubin elevated ?  Hypokalemia ?  Hypomagnesemia ?  Elevated alkaline phosphatase level ?  SVT (supraventricular tachycardia) ([place]) ?  Hypocalcemia ?  Edema ?  UTI (urinary tract infection) ?  Folate deficiency ?  Conjunctivitis ?  Vitamin D deficiency ?   Erythema of abdominal wall ? ? ? ?Assessment and Plan: ?* Symptomatic anemia ?Hgb of 6.9 down from 9.1just last week on presentation. ? Required 2upRBC transfusion in last admission mid-March after he presented with hypotension and had downward trending Hgb following fluid resuscitation.   ?-He also had chronic diarrhea at that time and underwent colonoscopy and EGD on 07/01/2021 that was unremarkable.  FOBT x2 was negative at that time.  ?- He was noted to have folate deficiency and was started on supplementation at home. ?-Status post transfusion 1 unit packed red blood cells on admission with hemoglobin of 6.6 (07/31/2021). ?-Status post transfusion of 2 units packed red blood cells 07/31/2021 with hemoglobin currently at 8.2 this morning.  ?-Patient with no overt gross bleeding. ?-Patient with elevated bilirubin, MCV of 106.6, indirect bilirubin of 3.5.   ?-LDH of 219.  Haptoglobin within normal limits of 69.  Absolute reticulocyte count of 37.9.  Per hematology peripheral smear with no schistocytes noted.   ?-Ceruloplasmin level decreased at 15.7. ?-Status post IV copper 07/31/2021. ?-Patient seen in consultation by gastroenterology who are recommended hematology input.   ?-Patient seen in consultation by hematology who are following, and reviewed patient's labs and per note no evidence of hemolytic anemia.  ?-Per hematology patient with folate deficiency as well as low ceruloplasmin, patient on folic acid as well as received copper supplementation and feel patient's anemia is likely secondary to anemia of chronic disease secondary to multiple medical  issues including liver disease.  ?-Hematology recommending supportive care and has signed off as of 08/01/2021.  ?-Transfusion threshold hemoglobin < 7. ?-Continue folic acid 1 mg daily. ?-If continued drop in hemoglobin may need capsule endoscopy for further evaluation per GI/hematology. ?-GI following and appreciate input and recommendations. ?- ? ?Hypotension ?SBP  95 and improved with transfusion. ?Blood pressure currently 119/76 ?Albumin of 2.3. ?Status post IV albumin twice daily x2 days. ? ? ?Serum total bilirubin elevated ?Total bilirubin of 5.4 on admission, which has trended up from 3.1 several weeks ago.   ?-Total bilirubin fluctuated was 5.4 >>>> 6.5 >>>> 8.0>>> 5.2>>> 7.9 >>>> 5.9 ?-  Alk phosphatase levels elevated but trending down. ?- recently had a CT abdomen and pelvis on 3/12 that showed severe hepatic steatosis and hepatomegaly but no biliary dilatation.  ?-MRCP done with no findings to suggest biliary tract obstruction, severe hepatomegaly with severe hepatic steatosis, trace volume ascites.  ?-Bilirubin fluctuating trending back down currently at 7.9 from 5.2 from 8.0, indirect and direct bilirubin levels fluctuating. AST of 46. ?-Haptoglobin within normal limits..  LDH of 219.  Ceruloplasmin level of 15.7. ?-Patient received a dose of IV copper 07/31/2021 per hematology. ?-IgG serum of 1325.  Alpha 1 antitrypsin of 181.  ANA negative. ?-24-hour urine copper study pending to evaluate for Wilson's disease per GI, hemochromatosis evaluation also pending per GI. ?-Urinary copper 24-hour discontinued per GI as patient had received IV copper. ?-May need liver biopsy. ?-GI consulted and following.  ? ?Erythema of abdominal wall ?- Patient with erythema with some skin tears with small amount of purulent drainage noted on lower abdominal wall fold extending into the mons pubis and groin region concerning for possible candidiasis secondary bacterial process.  Also noted to be malodorous. ?-Some clinical improvement. ?-Patient currently on ciprofloxacin for UTI. ?-Continue nystatin powder to the region. ?-Startwed oral doxycycline in addition. ?-Follow. ? ?Vitamin D deficiency ?Vitamin D level of 11.42. ?Vitamin D supplementation with vitamin D 3 50,000 units weekly. ?-Outpatient follow-up with PCP ? ?Conjunctivitis ?- cipro eye drops QID d2/7 ? ?Folate deficiency ?-  Folic acid 1 mg IV daily. ?-Transition to oral folic acid daily. ? ?UTI (urinary tract infection) ?Urine culture with > 100,000 colonies of Proteus Mirabella's and Klebsiella pneumonia with resistance to Macrobid and ampicillin.   ?-Continue ciprofloxacin.   ? ?Edema ?Possible third spacing or potential hepatic etiology. BNP is low and no CXR finding of edema to suggest cardiac cause.  ?Unable to diuresis while receiving blood transfusion  ?-2D echo with normal EF,NWMA, grade 1 diastolic dysfunction. ?-Continue IV albumin twice daily x2 days. ?-Patient given Lasix in between transfusions of packed red blood cells. ?-Follow. ? ? ? ?Hypocalcemia ?Corrected Ca of 9.48. ?-Repleted with IV Calcium gluconate.  ?-Patient with vitamin D deficiency and placed on oral vitamin D supplementation.  ?-Repeat labs in the AM. ? ?SVT (supraventricular tachycardia) ([place]) ?Patient having intermittent nonsustained SVT likely secondary to electrolyte derangement in the setting of symptomatic anemia. Unclear cause of low electrolytes since she is not having any GI loss or renal loss. ?-Patient with a 40 beat run of SVT in the morning of 07/31/2021 however remained asymptomatic.  ?-No SVT noted on telemetry this morning. ?-Magnesium admission 0.9 >>>1.2>>>> 2.0>>>> 1.5, potassium at 3.0>>> 3.2>>>> 3.5>>> 3.6>>>> 3.2>>> 4.1.  ?-Goal to keep potassium approximately 4, magnesium approximately 2.  ?-Magnesium at 2.1 today. ?-Status post IV magnesium. ?-Patient started on oral magnesium tablets today. ?-2D echo done with normal EF,NWMA, grade  1 diastolic dysfunction. ?-Patient status post total transfusion 3 units packed red blood cells with hemoglobin at 8.2 this morning.  ?-Continue low-dose Lopressor 12.5 mg twice daily.  ?-Patient seen in consultation by cardiology who recommended keeping on low-dose metoprolol and uptitrate as blood pressure allows.  ?-Per cardiology patient is not a candidate at present time for invasive treatment given  severity of other problems and recommending optimization of electrolytes and keeping hemoglobin above 7.  ?-Cardiology following and appreciate input and recommendations.   ? ?Elevated alkaline phosphatase

## 2021-08-02 NOTE — Progress Notes (Signed)
? ?Progress Note ? ?Patient Name: Laura [name] ?Date of Encounter: 08/02/2021 ? ?Primary Cardiologist: None  ? ? ? ?Patient Profile  ?   ?61 y.o. adult admitted with shortness of breath tachycardia up to 170 and hemoglobin of 6.9, hypokalemia hypomagnesemia and transaminitis.  Hematology evaluation suggested the anemia was of chronic disease.  Wilson's disease is being evaluated. ?Echocardiogram 4/12--normal LV function ?ECG demonstrated narrow QRS tachycardia long RP s metoprolol started ?MR abdomen severe hepatic steatosis and hepatomegaly ? ?Subjective  ? ?No chest pain or shortness of breath.  Denies palpitations and indeed not know when the heart rate was fast ? ?Inpatient Medications  ?  ?Scheduled Meds: ? sodium chloride   Intravenous Once  ? ciprofloxacin  2 drop Both Eyes QID  ? ciprofloxacin  500 mg Oral BID  ? doxycycline  100 mg Oral Q12H  ? folic acid  1 mg Intravenous Daily  ? magnesium oxide  400 mg Oral BID  ? metoprolol tartrate  12.5 mg Oral BID  ? nicotine  14 mg Transdermal Daily  ? nystatin   Topical TID  ? pantoprazole  40 mg Oral BID AC  ? Vitamin D (Ergocalciferol)  50,000 Units Oral Q7 days  ? ?Continuous Infusions: ? sodium chloride 10 mL/hr at 07/30/21 2052  ? ?PRN Meds: ?sodium chloride, alum & mag hydroxide-simeth, oxyCODONE-acetaminophen  ? ?Vital Signs  ?  ?Vitals:  ? 08/01/21 1952 08/02/21 0411 08/02/21 0426 08/02/21 1208  ?BP: 107/70  108/77 119/76  ?Pulse: 71   78  ?Resp: 16  16 18   ?Temp: (!) 97.4 ?F (36.3 ?C)  (!) 97.4 ?F (36.3 ?C) 97.9 ?F (36.6 ?C)  ?TempSrc: Oral  Oral Oral  ?SpO2: 97%  94% 95%  ?Weight:  77.4 kg    ? ? ?Intake/Output Summary (Last 24 hours) at 08/02/2021 1438 ?Last data filed at 08/02/2021 0909 ?Gross per 24 hour  ?Intake 522.68 ml  ?Output 900 ml  ?Net -377.32 ml  ? ?Filed Weights  ? 07/31/21 0539 08/01/21 0351 08/02/21 0411  ?Weight: 74.9 kg 76.9 kg 77.4 kg  ? ? ?Telemetry  ?  ?No tachycardia since transfer from the ER.  Reviewing the data from the ER most  consistent with atrial tachycardia as previously noted- Personally Reviewed ? ?ECG  ?  ?  ? ?Physical Exam  ?  ?GEN: No acute distress.   ?Neck: No JVD ?Cardiac: RRR, no murmurs, rubs, or gallops.  ?Respiratory: Clear to auscultation bilaterally. ?GI: Soft, nontender, non-distended  ?MS: No edema; No deformity. ?Neuro:  Nonfocal  ?Psych: Normal affect  ? ?Labs  ?  ?Chemistry ?Recent Labs  ?Lab 07/31/21 ?0156 08/01/21 ?0354 08/02/21 ?0352  ?NA 133* 130* 132*  ?K 3.6 3.2* 4.1  ?CL 96* 91* 96*  ?CO2 28 31 28   ?GLUCOSE 91 101* 80  ?BUN 6 9 7   ?CREATININE 0.52 0.66 0.54  ?CALCIUM 7.4* 7.8* 8.2*  ?PROT 4.9* 5.6* 5.0*  ?ALBUMIN 1.8* 2.3* 2.4*  ?AST 46* 51* 42*  ?ALT 22 22 19   ?ALKPHOS 197* 195* 161*  ?BILITOT 5.2* 7.9* 5.9*  ?GFRNONAA >60 >60 >60  ?ANIONGAP 9 8 8   ?  ? ?Hematology ?Recent Labs  ?Lab 07/31/21 ?0314 07/31/21 ?1525 08/01/21 ?0354 08/02/21 ?0352  ?WBC 10.3  --  7.4 6.5  ?RBC 1.83*  --  3.05* 2.50*  ?HGB 6.6* 10.3* 9.9* 8.2*  ?HCT 18.7* 29.1* 28.9* 23.7*  ?MCV 102.2*  --  94.8 94.8  ?MCH 36.1*  --  32.5 32.8  ?  MCHC 35.3  --  34.3 34.6  ?RDW 19.9*  --  23.2* 22.4*  ?PLT 94*  --  90* 80*  ? ? ?Cardiac EnzymesNo results for input(s): TROPONINI in the last 168 hours. No results for input(s): TROPIPOC in the last 168 hours.  ? ?BNP ?Recent Labs  ?Lab 07/29/21 ?2105  ?BNP 128.3*  ?  ? ?DDimer No results for input(s): DDIMER in the last 168 hours.  ? ?Radiology  ?  ?No results found. ? ?Cardiac Studies  ? ?(As above)  ?Assessment & Plan  ?  ?SVT ?Hypokalemia and hypomagnesemia ?Steatosis and hepatomegaly ?Anemia-recurrent ? ?Continue metoprolol. ?We will uptitrate as blood pressure allows ?Aggressive repletion of potassium and magnesium ? ? ?   ? ?For questions or updates, please contact [facility] ?Please consult [email or web address] for contact info under Cardiology/STEMI. ?  ?   ?Signed, ?Sherryl Manges, MD  ?08/02/2021, 2:38 PM   ? ?

## 2021-08-03 LAB — CBC WITH DIFFERENTIAL/PLATELET
Abs Immature Granulocytes: 0.05 10*3/uL (ref 0.00–0.07)
Basophils Absolute: 0.1 10*3/uL (ref 0.0–0.1)
Basophils Relative: 1 %
Eosinophils Absolute: 0 10*3/uL (ref 0.0–0.5)
Eosinophils Relative: 1 %
HCT: 27.7 % — ABNORMAL LOW (ref 36.0–46.0)
Hemoglobin: 9.4 g/dL — ABNORMAL LOW (ref 12.0–15.0)
Immature Granulocytes: 1 %
Lymphocytes Relative: 7 %
Lymphs Abs: 0.6 10*3/uL — ABNORMAL LOW (ref 0.7–4.0)
MCH: 33 pg (ref 26.0–34.0)
MCHC: 33.9 g/dL (ref 30.0–36.0)
MCV: 97.2 fL (ref 80.0–100.0)
Monocytes Absolute: 0.5 10*3/uL (ref 0.1–1.0)
Monocytes Relative: 6 %
Neutro Abs: 7 10*3/uL (ref 1.7–7.7)
Neutrophils Relative %: 84 %
Platelets: 95 10*3/uL — ABNORMAL LOW (ref 150–400)
RBC: 2.85 MIL/uL — ABNORMAL LOW (ref 3.87–5.11)
RDW: 21.6 % — ABNORMAL HIGH (ref 11.5–15.5)
WBC: 8.2 10*3/uL (ref 4.0–10.5)
nRBC: 0 % (ref 0.0–0.2)

## 2021-08-03 LAB — RENAL FUNCTION PANEL
Albumin: 2.6 g/dL — ABNORMAL LOW (ref 3.5–5.0)
Anion gap: 10 (ref 5–15)
BUN: 8 mg/dL (ref 6–20)
CO2: 25 mmol/L (ref 22–32)
Calcium: 8.6 mg/dL — ABNORMAL LOW (ref 8.9–10.3)
Chloride: 96 mmol/L — ABNORMAL LOW (ref 98–111)
Creatinine, Ser: 0.64 mg/dL (ref 0.44–1.00)
GFR, Estimated: >60 mL/min (ref >60)
Glucose, Bld: 74 mg/dL (ref 70–99)
Phosphorus: 2.7 mg/dL (ref 2.5–4.6)
Potassium: 4.2 mmol/L (ref 3.5–5.1)
Sodium: 131 mmol/L — ABNORMAL LOW (ref 135–145)

## 2021-08-03 LAB — MITOCHONDRIAL ANTIBODIES: Mitochondrial M2 Ab, IgG: 20.6 Units — ABNORMAL HIGH (ref 0.0–20.0)

## 2021-08-03 LAB — MAGNESIUM: Magnesium: 1.8 mg/dL (ref 1.7–2.4)

## 2021-08-03 LAB — HEPATIC FUNCTION PANEL
ALT: 20 U/L (ref 0–44)
AST: 45 U/L — ABNORMAL HIGH (ref 15–41)
Albumin: 2.7 g/dL — ABNORMAL LOW (ref 3.5–5.0)
Alkaline Phosphatase: 190 U/L — ABNORMAL HIGH (ref 38–126)
Bilirubin, Direct: 3.1 mg/dL — ABNORMAL HIGH (ref 0.0–0.2)
Indirect Bilirubin: 3.5 mg/dL — ABNORMAL HIGH (ref 0.3–0.9)
Total Bilirubin: 6.6 mg/dL — ABNORMAL HIGH (ref 0.3–1.2)
Total Protein: 6.2 g/dL — ABNORMAL LOW (ref 6.5–8.1)

## 2021-08-03 LAB — ANTI-SMOOTH MUSCLE ANTIBODY, IGG: F-Actin IgG: 9 Units (ref 0–19)

## 2021-08-03 MED ORDER — SULFAMETHOXAZOLE-TRIMETHOPRIM 800-160 MG PO TABS
1.0000 | ORAL_TABLET | Freq: Two times a day (BID) | ORAL | Status: DC
  Administered 2021-08-03 – 2021-08-04 (×2): 1 via ORAL
  Filled 2021-08-03 (×3): qty 1

## 2021-08-03 MED ORDER — MAGNESIUM SULFATE 2 GM/50ML IV SOLN
2.0000 g | Freq: Once | INTRAVENOUS | Status: AC
  Administered 2021-08-03: 2 g via INTRAVENOUS
  Filled 2021-08-03: qty 50

## 2021-08-03 MED ORDER — CIPROFLOXACIN HCL 0.3 % OP SOLN
2.0000 [drp] | Freq: Every day | OPHTHALMIC | Status: DC
  Administered 2021-08-03 – 2021-08-04 (×6): 2 [drp] via OPHTHALMIC
  Filled 2021-08-03: qty 2.5

## 2021-08-03 NOTE — Progress Notes (Signed)
?PROGRESS NOTE ? ? ? Laura [name]  LAG:[id number] DOB: November 15, 1960 DOA: 07/29/2021 ?PCP: Lauree Chandler, NP  ? ? ?Chief Complaint  ?Patient presents with  ? Edema  ? ? ?Brief Narrative:  ?H&P per Dr. Flossie Buffy ?Laura [name] is a 61 y.o. adult female who identifies as female with medical history significant of hypertension who presents with concerns of increasing shortness of breath and edema. ?  ?Patient is a limited historian and not able to provide a good timeline.  He reports feeling short of breath with ambulation for some time and also has noted edema of his legs and hands.  Denies any chest pain or palpitation.  He was admitted mid March with hypotension, dehydration with electrolyte derangements from chronic diarrhea which he says is now resolved.  He also required transfusion after hemoglobin downward trended from 7.8-5.8 with hydration at that time. ?  ?On arrival to the ED, he was afebrile and had intermittent SVT with heart rate up to 170 with mild hypotension with SBP of 95 on room air. ?Mom mild leukocytosis of 11.1, hemoglobin of 6.9 from a prior of 9.1 last week.  Potassium of 3, magnesium is 0.9, corrected calcium of 8.3. ?He was noted to have mildly elevated AST of 44, total bilirubin has trended up from 3.1-5.1.  Alk phos also elevated at 244.  He denies any abdominal pain or symptoms. ?  ?Recent CT of the abdomen pelvis on 3/12 showed severe hepatic steatosis and hepatomegaly but no biliary obstruction. ?  ?Chest x-ray showed no pulmonary edema or infiltrate. ? ?Patient transfused 1 unit packed red blood cells. ?GI consultation obtained.  ? ? ?Assessment & Plan: ? Principal Problem: ?  Symptomatic anemia ?Active Problems: ?  Hypotension ?  Serum total bilirubin elevated ?  Hypokalemia ?  Hypomagnesemia ?  Elevated alkaline phosphatase level ?  SVT (supraventricular tachycardia) ([place]) ?  Hypocalcemia ?  Edema ?  UTI (urinary tract infection) ?  Folate deficiency ?  Conjunctivitis ?  Vitamin D deficiency ?   Erythema of abdominal wall ? ? ? ?Assessment and Plan: ?* Symptomatic anemia ?Hgb of 6.9 down from 9.1just last week on presentation. ? Required 2upRBC transfusion in last admission mid-March after he presented with hypotension and had downward trending Hgb following fluid resuscitation.   ?-He also had chronic diarrhea at that time and underwent colonoscopy and EGD on 07/01/2021 that was unremarkable.  FOBT x2 was negative at that time.  ?- He was noted to have folate deficiency and was started on supplementation at home. ?-Status post transfusion 1 unit packed red blood cells on admission with hemoglobin of 6.6 (07/31/2021). ?-Status post transfusion of 2 units packed red blood cells 07/31/2021 with hemoglobin currently at 9.4 this morning.  ?-Patient with no overt gross bleeding. ?-Patient with elevated bilirubin, MCV of 106.6, indirect bilirubin of 3.5.   ?-LDH of 219.  Haptoglobin within normal limits of 69.  Absolute reticulocyte count of 37.9.  Per hematology peripheral smear with no schistocytes noted.   ?-Ceruloplasmin level decreased at 15.7. ?-Status post IV copper 07/31/2021. ?-Patient seen in consultation by gastroenterology who are recommended hematology input.   ?-Patient seen in consultation by hematology who were following, and reviewed patient's labs and per note no evidence of hemolytic anemia.  ?-Per hematology patient with folate deficiency as well as low ceruloplasmin, patient on folic acid as well as received copper supplementation and feel patient's anemia is likely secondary to anemia of chronic disease secondary to multiple medical  issues including liver disease.  ?-Hematology recommending supportive care and has signed off as of 08/01/2021.  ?-Transfusion threshold hemoglobin < 7. ?-Continue folic acid 1 mg daily. ?-If continued drop in hemoglobin may need capsule endoscopy for further evaluation per GI/hematology. ?-GI following and appreciate input and recommendations. ?- ? ?Hypotension ?SBP  95 and improved with transfusion. ?Blood pressure improvement. ?Albumin of 2.3. ?Status post IV albumin twice daily x2 days. ? ? ?Serum total bilirubin elevated ?Total bilirubin of 5.4 on admission, which has trended up from 3.1 several weeks ago.   ?-Total bilirubin fluctuated was 5.4 >>>> 6.5 >>>> 8.0>>> 5.2>>> 7.9 >>>> 5.9>>>>6.6 ?-  Alk phosphatase levels elevated but trending down. ?- recently had a CT abdomen and pelvis on 3/12 that showed severe hepatic steatosis and hepatomegaly but no biliary dilatation.  ?-MRCP done with no findings to suggest biliary tract obstruction, severe hepatomegaly with severe hepatic steatosis, trace volume ascites.  ?-Bilirubin fluctuating trending back down currently at 7.9 from 5.2 from 8.0, indirect and direct bilirubin levels fluctuating. AST of 46. ?-Haptoglobin within normal limits..  LDH of 219.  Ceruloplasmin level of 15.7. ?-Patient received a dose of IV copper 07/31/2021 per hematology. ?-IgG serum of 1325.  Alpha 1 antitrypsin of 181.  ANA negative. ?-24-hour urine copper study pending to evaluate for Wilson's disease per GI, hemochromatosis evaluation also pending per GI. ?-Urinary copper 24-hour discontinued per GI as patient had received IV copper. ?-May need liver biopsy. ?-GI consulted and following.  ? ?Erythema of abdominal wall ?- Patient with erythema with some skin tears with small amount of purulent drainage noted on lower abdominal wall fold extending into the mons pubis and groin region concerning for possible candidiasis secondary bacterial process.  Also noted to be malodorous. ?-Some clinical improvement. ?-Patient currently on ciprofloxacin for UTI. ?-Continue nystatin powder to the region. ?-Continue doxycycline. ?-Follow. ? ?Vitamin D deficiency ?Vitamin D level of 11.42. ?Vitamin D supplementation with vitamin D 3 50,000 units weekly. ?-Outpatient follow-up with PCP ? ?Conjunctivitis ?- Change cipro eye drops to 5 times daily, day 3/7. ? ?Folate  deficiency ?- Continue folic acid 1 mg daily. ? ?UTI (urinary tract infection) ?Urine culture with > 100,000 colonies of Proteus Mirabilis and Klebsiella pneumonia with resistance to Macrobid and ampicillin.   ?-Continue ciprofloxacin and treat for total of 7 days.   ? ?Edema ?Possible third spacing or potential hepatic etiology. BNP is low and no CXR finding of edema to suggest cardiac cause.  ?Unable to diuresis while receiving blood transfusion  ?-2D echo with normal EF,NWMA, grade 1 diastolic dysfunction. ?-Continue IV albumin twice daily x2 days. ?-Patient given Lasix in between transfusions of packed red blood cells. ?-Follow. ? ? ? ?Hypocalcemia ?Corrected Ca of 9.64. ?-Repleted with IV Calcium gluconate.  ?-Patient with vitamin D deficiency and placed on oral vitamin D supplementation.  ?-Repeat labs in the AM. ? ?SVT (supraventricular tachycardia) ([place]) ?Patient having intermittent nonsustained SVT likely secondary to electrolyte derangement in the setting of symptomatic anemia. Unclear cause of low electrolytes since she is not having any GI loss or renal loss. ?-Patient with a 40 beat run of SVT in the morning of 07/31/2021 however remained asymptomatic.  ?-No SVT noted on telemetry since 08/01/2021 ?-Magnesium admission 0.9 >>>1.2>>>> 2.0>>>> 1.5>> 2.1>>>> 1.8, potassium at 3.0>>> 3.2>>>> 3.5>>> 3.6>>>> 3.2>>> 4.1>>> 4.2 ?-Goal to keep potassium approximately 4, magnesium approximately 2.  ?-Magnesium at 1.8 today. ?-Magnesium sulfate 2 g IV x1 ?-Continue oral magnesium supplementation. ?-2D echo done with  normal EF,NWMA, grade 1 diastolic dysfunction. ?-Patient status post total transfusion 3 units packed red blood cells with hemoglobin at 9.4 this morning.  ?-Continue low-dose Lopressor 12.5 mg twice daily.  ?-Patient seen in consultation by cardiology who recommended keeping on low-dose metoprolol and uptitrate as blood pressure allows.  ?-Per cardiology patient is not a candidate at present time for  invasive treatment given severity of other problems and recommending optimization of electrolytes and keeping hemoglobin above 7.  ?-Cardiology following and appreciate input and recommendations.   ? ?Elevate

## 2021-08-03 NOTE — Progress Notes (Signed)
Pt has gotten about 5d of abx for UTI/cellulitis infection. D/w Dr Janee Morn and we will optimize her regimen to Bactrim DS 1 BID for an additional 5 days to cover for both indication. Both UTI organisms were sens to bactrim. ? ?Ulyses Southward, PharmD, BCIDP, AAHIVP, CPP ?Infectious Disease Pharmacist ?08/03/2021 4:15 PM ? ? ?

## 2021-08-03 NOTE — Progress Notes (Addendum)
? ? ? ?[facility] Progress Note ? ?CC:    Elevated LFTs ? ?Subjective: No N/V or abdominal pain. She passed a normal brown stool today. Ambulated in the hall x 2. No CP or SOB.  ? ? ?Objective:  ?Vital signs in last 24 hours: ?Temp:  [97.7 ?F (36.5 ?C)-98.3 ?F (36.8 ?C)] 98.3 ?F (36.8 ?C) (04/16 3267) ?Pulse Rate:  [71] 71 (04/16 0806) ?Resp:  [18-19] 18 (04/16 0806) ?BP: (98-116)/(65-73) 98/65 (04/16 1245) ?SpO2:  [96 %-97 %] 97 % (04/16 0806) ?Weight:  [76.9 kg] 76.9 kg (04/16 0353) ?Last BM Date : 08/03/21 ?General:  Alert 61 year old patient in no acute distress. ?Heart: Regular rate and rhythm, no murmurs. ?Pulm:  Breath sounds clear. ?Abdomen: Soft, nondistended.  Nontender.  Positive bowel sounds all 4 quadrants. ?Extremities:  Without edema. ?Neurologic:  Alert and  oriented x 4. Grossly normal neurologically. ?Psych:  Alert and cooperative. Normal mood and affect. ? ?Intake/Output from previous day: ?04/15 0701 - 04/16 0700 ?In: 600 [P.O.:600] ?Out: 500 [Urine:500] ?Intake/Output this shift: ?Total I/O ?In: 720 [P.O.:720] ?Out: -  ? ?Lab Results: ?Recent Labs  ?  08/01/21 ?0354 08/02/21 ?8099 08/03/21 ?0416  ?WBC 7.4 6.5 8.2  ?HGB 9.9* 8.2* 9.4*  ?HCT 28.9* 23.7* 27.7*  ?PLT 90* 80* 95*  ? ?BMET ?Recent Labs  ?  08/01/21 ?0354 08/02/21 ?0352 08/03/21 ?0416  ?NA 130* 132* 131*  ?K 3.2* 4.1 4.2  ?CL 91* 96* 96*  ?CO2 _0 ?GLUCOSE 101* 80 74  ?BUN _1 ?CREATININE 0.66 0.54 0.64  ?CALCIUM 7.8* 8.2* 8.6*  ? ?LFT ?Recent Labs  ?  08/03/21 ?0416  ?PROT 6.2*  ?ALBUMIN 2.7*  2.6*  ?AST 45*  ?ALT 20  ?ALKPHOS 190*  ?BILITOT 6.6*  ?BILIDIR 3.1*  ?IBILI 3.5*  ? ?PT/INR ?No results for input(s): LABPROT, INR in the last 72 hours. ?Hepatitis Panel ?No results for input(s): HEPBSAG, HCVAB, HEPAIGM, HEPBIGM in the last 72 hours. ? ?No results found. ? ?Assessment / Plan: ? ?1) [name] is 61 year old female who identifies as a female who was admitted to the hospital with elevated total bili and alk  phos levels.  CTAP without contrast 06/29/2021 identified severe hepatic steatosis with hepatomegaly without evidence of biliary ductal dilatation.  Abdominal MRI/MRCP without contrast showed severe hepatic steatosis with severe hepatomegaly without evidence of biliary obstruction and trace ascites noted. Likely has cirrhosis, query alcohol associated liver disease. Peth and CDT  levels pending. Hep A, B and C serologies were nonreactive.  GGT 167. A1AT 181. IgG 1325. ANA negative. SMA 9. AMA elevated 20.6. Ceruloplasmin level low at 15.7.  Elevated ferritin and saturation ratios elevated. HFE mutations pending.  T. Bili 5.9 -> 6.6. Indirect > direct. Alk phos 161 -> 190. AST 42 -> 45. ALT 19 -> 20. Mother died from liver cancer. ?-24-hour urinary copper collection to rule out Wilson's disease aborted as patient received IV copper chloride 37m x 1 per hematology. I checked with our pharmacist, no data on IV copper half life, biological half life for dietary copper is 13 to 33 days ?-Repeat ceruloplasmin as an outpatient ?-If no obvious cause of liver disease identified, a liver biopsy would be warranted ?-Avoid nephrotoxic medication ?-Continue to trend LFTs ?-Defer discharge recommendations to Dr. CCandis Schatz ?-Follow up in our outpatient GI clinic 2 to 3 weeks post hospital discharge  ?  ?2) Acute on chronic macrocytic anemia. Admission Hg 6.9 (Hg 9.1 on 4/2).  Transfused 1 unit of PRBCs -> Hg 7.9 -> 7.6 -> Hg 6.6 -> transfused 1 unit of  PRBCs -> Hg 10.3 -> Hg 9.9 -> 8.2 -> today Hg 9.4. EGD and colonoscopy 07/01/2021 findings did not explain etiology for his anemia.  Folate deficiency on folic acid po. Iron 114.  TIBC 123.  Work-up by hematology negative for hemolytic anemia.  Unlikely GI bleed with negative FOBT 4/16 and normal iron level.  ?-Transfuse as needed  ?-Appreciate hematology evaluation recommendation ?-Consider small bowel capsule endoscopy if hemoglobin level significantly drops during this  hospitalization ?  ?3) Mild thrombocytopenia. PLT 95.  ?  ?4) New onset LE edema.  Echo with LV EF 60 to 65%.  Normal RV function.  Receiving IV albumin twice daily. ?  ?  ? ?Principal Problem: ?  Symptomatic anemia ?Active Problems: ?  Hypotension ?  Hypokalemia ?  Hypomagnesemia ?  Serum total bilirubin elevated ?  Elevated alkaline phosphatase level ?  SVT (supraventricular tachycardia) (HCC) ?  Hypocalcemia ?  Edema ?  UTI (urinary tract infection) ?  Folate deficiency ?  Conjunctivitis ?  Vitamin D deficiency ?  Erythema of abdominal wall ? ? ? ? LOS: 4 days  ? ?Colleen M Kennedy-Smith  08/03/2021, 3:29 PM ? ?I have taken a history, reviewed the chart and examined the patient. I performed a substantive portion of this encounter, including complete performance of at least one of the key components, in conjunction with the APP. I agree with the APP's note, impression and recommendations ? ?Patient's hemoglobin increased to 9.4 from 8.2 and had another negative fecal occult blood test without overt GI bleeding.  No plans for further assessment for GI bleed. ?The patient's liver enzymes have been stable.  Still waiting on a few blood test to evaluate for chronic liver disease. ?I like to see her in clinic for follow-up of suspected cirrhosis with possibly elastography versus liver biopsy ?No further acute inpatient issues. ?GI will sign off at this time. ? ?Scott E. Cunningham, MD ?[facility] ? ? ? ?

## 2021-08-03 NOTE — Progress Notes (Signed)
Mobility Specialist Progress Note: ? ? 08/03/21 1415  ?Mobility  ?Activity Ambulated with assistance in hallway  ?Level of Assistance Standby assist, set-up cues, supervision of patient - no hands on  ?Assistive Device Front wheel walker  ?Distance Ambulated (ft) 300 ft  ?Activity Response Tolerated well  ?$Mobility charge 1 Mobility  ? ?Pt eager for mobility session. Required modA to stand from EOB, minG during gait. Pt back in bed with all needs met.  ? ?Laura [name] ?Acute Rehab ?Phone: [phone] ?Office Phone: [phone] ? ?

## 2021-08-03 NOTE — Progress Notes (Signed)
? ?Progress Note ? ?Patient Name: Laura [name] ?Date of Encounter: 08/03/2021 ? ?Primary Cardiologist: None  ? ? ? ?Patient Profile  ?   ?61 y.o. adult admitted with shortness of breath tachycardia up to 170 and hemoglobin of 6.9, hypokalemia hypomagnesemia and transaminitis.  Hematology evaluation suggested the anemia was of chronic disease.  Wilson's disease is being evaluated. ?Echocardiogram 4/12--normal LV function ?ECG demonstrated narrow QRS tachycardia long RP s metoprolol started ?MR abdomen severe hepatic steatosis and hepatomegaly ? ?Subjective  ? ?Denies chest pain or shortness of breath.    feels better ? ?Inpatient Medications  ?  ?Scheduled Meds: ? sodium chloride   Intravenous Once  ? ciprofloxacin  2 drop Both Eyes QID  ? ciprofloxacin  500 mg Oral BID  ? doxycycline  100 mg Oral [id number]  ? folic acid  1 mg Oral Daily  ? magnesium oxide  400 mg Oral BID  ? metoprolol tartrate  12.5 mg Oral BID  ? nicotine  14 mg Transdermal Daily  ? nystatin   Topical TID  ? pantoprazole  40 mg Oral BID AC  ? Vitamin D (Ergocalciferol)  50,000 Units Oral Q7 days  ? ?Continuous Infusions: ? sodium chloride 10 mL/hr at 07/30/21 2052  ? ?PRN Meds: ?sodium chloride, alum & mag hydroxide-simeth, oxyCODONE-acetaminophen  ? ?Vital Signs  ?  ?Vitals:  ? 08/02/21 1208 08/03/21 0353 08/03/21 0354 08/03/21 0806  ?BP: 119/76 116/73  98/65  ?Pulse: 78   71  ?Resp: 18 19  18   ?Temp: 97.9 ?F (36.6 ?C) 97.7 ?F (36.5 ?C)  98.3 ?F (36.8 ?C)  ?TempSrc: Oral Oral  Oral  ?SpO2: 95% 96% 96% 97%  ?Weight:  76.9 kg    ? ? ?Intake/Output Summary (Last 24 hours) at 08/03/2021 1018 ?Last data filed at 08/03/2021 0856 ?Gross per 24 hour  ?Intake 1080 ml  ?Output 200 ml  ?Net 880 ml  ? ? ?Filed Weights  ? 08/01/21 0351 08/02/21 0411 08/03/21 0353  ?Weight: 76.9 kg 77.4 kg 76.9 kg  ? ? ?Telemetry  ?  ?No interval tachycardia personally reviewed   ? ?ECG  ?  ?  ? ?Physical Exam  ?Well developed and nourished in no acute distress ?HENT normal ?Neck  supple ?Clear ?Regular rate and rhythm, no murmurs or gallops ?Abd-soft   ?No Clubbing cyanosis edema ?Skin-warm and dry ?A & Oriented  Grossly normal sensory and motor function ? ?  ? ?Labs  ?  ?Chemistry ?Recent Labs  ?Lab 08/01/21 ?0354 08/02/21 ?0352 08/03/21 ?0416  ?NA 130* 132* 131*  ?K 3.2* 4.1 4.2  ?CL 91* 96* 96*  ?CO2 31 28 25   ?GLUCOSE 101* 80 74  ?BUN 9 7 8   ?CREATININE 0.66 0.54 0.64  ?CALCIUM 7.8* 8.2* 8.6*  ?PROT 5.6* 5.0* 6.2*  ?ALBUMIN 2.3* 2.4* 2.7*  2.6*  ?AST 51* 42* 45*  ?ALT 22 19 20   ?ALKPHOS 195* 161* 190*  ?BILITOT 7.9* 5.9* 6.6*  ?GFRNONAA >60 >60 >60  ?ANIONGAP 8 8 10   ? ?  ? ?Hematology ?Recent Labs  ?Lab 08/01/21 ?0354 08/02/21 ?0352 08/03/21 ?0416  ?WBC 7.4 6.5 8.2  ?RBC 3.05* 2.50* 2.85*  ?HGB 9.9* 8.2* 9.4*  ?HCT 28.9* 23.7* 27.7*  ?MCV 94.8 94.8 97.2  ?MCH 32.5 32.8 33.0  ?MCHC 34.3 34.6 33.9  ?RDW 23.2* 22.4* 21.6*  ?PLT 90* 80* 95*  ? ? ? ?Cardiac EnzymesNo results for input(s): TROPONINI in the last 168 hours. No results for input(s): TROPIPOC in the last  168 hours.  ? ?BNP ?Recent Labs  ?Lab 07/29/21 ?2105  ?BNP 128.3*  ? ?  ? ?DDimer No results for input(s): DDIMER in the last 168 hours.  ? ?Radiology  ?  ?No results found. ? ?Cardiac Studies  ? ?(As above)  ?Assessment & Plan  ?  ?SVT  ? ?Hypokalemia and hypomagnesemia  repleted  ? ?Steatosis and hepatomegaly ? ?Anemia-recurrent ? ?Hypotension ? ?Continue low-dose metoprolol.  Blood pressure ?  ?Continue low-dose metoprolol.  Blood pressure precludes up titration.  We will follow rhythm. ? ?If the rhythm becomes more of an issue, blood pressure prevents up titration of AV nodal blockers, the choice would be antiarrhythmics or ablation.  Hopefully we will avoid this ?   ? ?For questions or updates, please contact [place] ?Please consult [email or web address] for contact info under Cardiology/STEMI. ?  ?   ?Signed, ?Virl Axe, MD  ?08/03/2021, 10:18 AM   ? ?

## 2021-08-04 ENCOUNTER — Telehealth: Payer: Self-pay | Admitting: Nurse Practitioner

## 2021-08-04 ENCOUNTER — Ambulatory Visit: Payer: Medicare Other | Admitting: Nurse Practitioner

## 2021-08-04 ENCOUNTER — Other Ambulatory Visit (HOSPITAL_COMMUNITY): Payer: Self-pay

## 2021-08-04 LAB — HEPATIC FUNCTION PANEL
ALT: 16 U/L (ref 0–44)
AST: 34 U/L (ref 15–41)
Albumin: 2.2 g/dL — ABNORMAL LOW (ref 3.5–5.0)
Alkaline Phosphatase: 157 U/L — ABNORMAL HIGH (ref 38–126)
Bilirubin, Direct: 2.1 mg/dL — ABNORMAL HIGH (ref 0.0–0.2)
Indirect Bilirubin: 2.1 mg/dL — ABNORMAL HIGH (ref 0.3–0.9)
Total Bilirubin: 4.2 mg/dL — ABNORMAL HIGH (ref 0.3–1.2)
Total Protein: 5.2 g/dL — ABNORMAL LOW (ref 6.5–8.1)

## 2021-08-04 LAB — CBC
HCT: 23.8 % — ABNORMAL LOW (ref 36.0–46.0)
Hemoglobin: 7.9 g/dL — ABNORMAL LOW (ref 12.0–15.0)
MCH: 32.4 pg (ref 26.0–34.0)
MCHC: 33.2 g/dL (ref 30.0–36.0)
MCV: 97.5 fL (ref 80.0–100.0)
Platelets: 94 10*3/uL — ABNORMAL LOW (ref 150–400)
RBC: 2.44 MIL/uL — ABNORMAL LOW (ref 3.87–5.11)
RDW: 20.9 % — ABNORMAL HIGH (ref 11.5–15.5)
WBC: 7.1 10*3/uL (ref 4.0–10.5)
nRBC: 0 % (ref 0.0–0.2)

## 2021-08-04 LAB — BASIC METABOLIC PANEL
Anion gap: 7 (ref 5–15)
BUN: 7 mg/dL (ref 6–20)
CO2: 28 mmol/L (ref 22–32)
Calcium: 8.5 mg/dL — ABNORMAL LOW (ref 8.9–10.3)
Chloride: 97 mmol/L — ABNORMAL LOW (ref 98–111)
Creatinine, Ser: 0.71 mg/dL (ref 0.44–1.00)
GFR, Estimated: >60 mL/min (ref >60)
Glucose, Bld: 86 mg/dL (ref 70–99)
Potassium: 3.6 mmol/L (ref 3.5–5.1)
Sodium: 132 mmol/L — ABNORMAL LOW (ref 135–145)

## 2021-08-04 LAB — GLUCOSE, CAPILLARY: Glucose-Capillary: 91 mg/dL (ref 70–99)

## 2021-08-04 MED ORDER — FLUCONAZOLE 200 MG PO TABS: 200.0000 mg | ORAL_TABLET | Freq: Every day | ORAL | Status: DC

## 2021-08-04 MED ORDER — FLUCONAZOLE IN SODIUM CHLORIDE 200-0.9 MG/100ML-% IV SOLN
200.0000 mg | Freq: Once | INTRAVENOUS | Status: AC
  Administered 2021-08-04: 200 mg via INTRAVENOUS
  Filled 2021-08-04: qty 100

## 2021-08-04 MED ORDER — POTASSIUM CHLORIDE CRYS ER 10 MEQ PO TBCR
40.0000 meq | EXTENDED_RELEASE_TABLET | Freq: Once | ORAL | Status: AC
  Administered 2021-08-04: 40 meq via ORAL
  Filled 2021-08-04: qty 4

## 2021-08-04 MED ORDER — PANTOPRAZOLE SODIUM 40 MG PO TBEC
40.0000 mg | DELAYED_RELEASE_TABLET | Freq: Every day | ORAL | 1 refills | Status: DC
  Filled 2021-08-04: qty 30, 30d supply, fill #0

## 2021-08-04 MED ORDER — VITAMIN D (ERGOCALCIFEROL) 1.25 MG (50000 UNIT) PO CAPS: 50000.0000 [IU] | ORAL_CAPSULE | ORAL | Status: DC

## 2021-08-04 MED ORDER — METOPROLOL TARTRATE 25 MG PO TABS
12.5000 mg | ORAL_TABLET | Freq: Two times a day (BID) | ORAL | 1 refills | Status: DC
  Filled 2021-08-04: qty 60, 60d supply, fill #0

## 2021-08-04 MED ORDER — MAGNESIUM OXIDE 400 MG PO TABS
400.0000 mg | ORAL_TABLET | Freq: Two times a day (BID) | ORAL | 1 refills | Status: DC
  Filled 2021-08-04: qty 60, 30d supply, fill #0

## 2021-08-04 MED ORDER — FLUCONAZOLE 200 MG PO TABS
200.0000 mg | ORAL_TABLET | Freq: Every day | ORAL | 0 refills | Status: AC
  Filled 2021-08-04: qty 6, 6d supply, fill #0

## 2021-08-04 MED ORDER — FOLIC ACID 1 MG PO TABS: 1.0000 mg | ORAL_TABLET | Freq: Every day | ORAL | Status: DC

## 2021-08-04 MED ORDER — NICOTINE 14 MG/24HR TD PT24
14.0000 mg | MEDICATED_PATCH | Freq: Every day | TRANSDERMAL | 0 refills | Status: DC
  Filled 2021-08-04: qty 28, 28d supply, fill #0

## 2021-08-04 MED ORDER — NYSTATIN 100000 UNIT/GM EX POWD
Freq: Three times a day (TID) | CUTANEOUS | 0 refills | Status: AC
  Filled 2021-08-04: qty 15, 7d supply, fill #0

## 2021-08-04 MED ORDER — CIPROFLOXACIN HCL 0.3 % OP SOLN
2.0000 [drp] | Freq: Every day | OPHTHALMIC | 0 refills | Status: AC
  Filled 2021-08-04: qty 5, 7d supply, fill #0

## 2021-08-04 MED ORDER — SULFAMETHOXAZOLE-TRIMETHOPRIM 800-160 MG PO TABS
1.0000 | ORAL_TABLET | Freq: Two times a day (BID) | ORAL | 0 refills | Status: AC
  Filled 2021-08-04: qty 8, 4d supply, fill #0

## 2021-08-04 NOTE — Telephone Encounter (Signed)
Per Alcide Evener, CRNP's request, pt has been scheduled for hosp f/u with Dr. Tomasa Rand on 08/28/21 @ 11:10am. Appt reminder has been mailed. Appt will reflect on AVS upon hosp discharge for pt future reference. ? ?

## 2021-08-04 NOTE — Progress Notes (Signed)
PT Cancellation Note ? ?Patient Details ?Name: Laura [name] ?MRN: [id number] ?DOB: 1960/07/23 ? ? ?Cancelled Treatment:    Reason Eval/Treat Not Completed: Other (comment) (Refused as she needed 20 more minutes to rest.  Will return as able.) ? ? ?Laura [name] F Catalina Salasar ?08/04/2021, 1:13 PM ?Muaz Shorey M,PT ?Acute Rehab Services ?[phone] (pager)  ? ? ?

## 2021-08-04 NOTE — Consult Note (Signed)
? ?  [facility] CM Inpatient Consult ? ? ?08/04/2021 ? ?Laura [name] ?[birth year] ?[id number] ? ?Follow up: Transition ? ?Met with patient at bedside.  Patient states no additional needs bedsides home health.  Gave patient an appointment reminder card for PCP follow up needs along with a 24 hour nurse advise line.   ? ?No other needs identified. ? ?Natividad Brood, RN BSN CCM ?[facility] Liaison ? [phone] business mobile phone ?Toll free office [phone]  ?Fax number: [phone] ?[email or web address] ? ? ? ?

## 2021-08-04 NOTE — Progress Notes (Addendum)
? ?Progress Note ? ?Patient Name: Laura [name] ?Date of Encounter: 08/04/2021 ? ?[facility] Cardiologist: Dorris Carnes, MD  ? ?Subjective  ? ?Episode of SVT last pm at 139-150 for about a min.  Pt not aware ?No chest pain ? ?Inpatient Medications  ?  ?Scheduled Meds: ? sodium chloride   Intravenous Once  ? ciprofloxacin  2 drop Both Eyes 5 X Daily  ? [START ON 08/05/2021] fluconazole  200 mg Oral Daily  ? folic acid  1 mg Oral Daily  ? magnesium oxide  400 mg Oral BID  ? metoprolol tartrate  12.5 mg Oral BID  ? nicotine  14 mg Transdermal Daily  ? nystatin   Topical TID  ? pantoprazole  40 mg Oral BID AC  ? potassium chloride  40 mEq Oral Once  ? sulfamethoxazole-trimethoprim  1 tablet Oral Q12H  ? Vitamin D (Ergocalciferol)  50,000 Units Oral Q7 days  ? ?Continuous Infusions: ? sodium chloride 10 mL/hr at 07/30/21 2052  ? fluconazole (DIFLUCAN) IV    ? ?PRN Meds: ?sodium chloride, alum & mag hydroxide-simeth, oxyCODONE-acetaminophen  ? ?Vital Signs  ?  ?Vitals:  ? 08/03/21 0806 08/03/21 2047 08/04/21 0530 08/04/21 0732  ?BP: 98/65 127/81 102/67 116/75  ?Pulse: 71   67  ?Resp: 18 17 19 18   ?Temp: 98.3 ?F (36.8 ?C) 98.1 ?F (36.7 ?C) 97.6 ?F (36.4 ?C) 98.3 ?F (36.8 ?C)  ?TempSrc: Oral Oral Oral Oral  ?SpO2: 97% 94% 99% 97%  ?Weight:   76.9 kg   ? ? ?Intake/Output Summary (Last 24 hours) at 08/04/2021 0846 ?Last data filed at 08/03/2021 2152 ?Gross per 24 hour  ?Intake 960 ml  ?Output --  ?Net 960 ml  ? ? ?  08/04/2021  ?  5:30 AM 08/03/2021  ?  3:53 AM 08/02/2021  ?  4:11 AM  ?Last 3 Weights  ?Weight (lbs) 169 lb 9.6 oz 169 lb 9.6 oz 170 lb 9.6 oz  ?Weight (kg) 76.93 kg 76.93 kg 77.384 kg  ?   ? ?Telemetry  ?  ?SR an episode of SVT at 1800 or so yesterday lasting about 1 min with rates 139 to 150 - Personally Reviewed ? ?ECG  ?  ?No new - Personally Reviewed ? ?Physical Exam  ? ?GEN: No acute distress.   ?Neck: No JVD  sitting up ?Cardiac: RRR, no murmurs, rubs, or gallops.  ?Respiratory: Clear to auscultation  bilaterally. ?GI: Soft, nontender, non-distended  ?MS: + 3+ edema of feet to ankles; No deformity. ?Neuro:  Nonfocal  ?Psych: Normal affect  ? ?Labs  ?  ?High Sensitivity Troponin:   ?Recent Labs  ?Lab 07/29/21 ?2105 07/29/21 ?2314  ?TROPONINIHS 18* 12  ?   ?Chemistry ?Recent Labs  ?Lab 08/01/21 ?0354 08/02/21 ?[id number] 08/03/21 ?0416 08/04/21 ?0234  ?NA 130* 132* 131* 132*  ?K 3.2* 4.1 4.2 3.6  ?CL 91* 96* 96* 97*  ?CO2 31 28 25 28   ?GLUCOSE 101* 80 74 86  ?BUN 9 7 8 7   ?CREATININE 0.66 0.54 0.64 0.71  ?CALCIUM 7.8* 8.2* 8.6* 8.5*  ?MG 1.5* 2.1 1.8  --   ?PROT 5.6* 5.0* 6.2* 5.2*  ?ALBUMIN 2.3* 2.4* 2.7*  2.6* 2.2*  ?AST 51* 42* 45* 34  ?ALT 22 19 20 16   ?ALKPHOS 195* 161* 190* 157*  ?BILITOT 7.9* 5.9* 6.6* 4.2*  ?GFRNONAA >60 >60 >60 >60  ?ANIONGAP 8 8 10 7   ?  ?Lipids No results for input(s): CHOL, TRIG, HDL, LABVLDL, LDLCALC, CHOLHDL in the  last 168 hours.  ?Hematology ?Recent Labs  ?Lab 08/02/21 ?[id number] 08/03/21 ?0416 08/04/21 ?0234  ?WBC 6.5 8.2 7.1  ?RBC 2.50* 2.85* 2.44*  ?HGB 8.2* 9.4* 7.9*  ?HCT 23.7* 27.7* 23.8*  ?MCV 94.8 97.2 97.5  ?MCH 32.8 33.0 32.4  ?MCHC 34.6 33.9 33.2  ?RDW 22.4* 21.6* 20.9*  ?PLT 80* 95* 94*  ? ?Thyroid No results for input(s): TSH, FREET4 in the last 168 hours.  ?BNP ?Recent Labs  ?Lab 07/29/21 ?2105  ?BNP 128.3*  ?  ?DDimer No results for input(s): DDIMER in the last 168 hours.  ? ?Radiology  ?  ?No results found. ? ?Cardiac Studies  ? ?Echo from 07/30/21: ?  ? 1. Left ventricular ejection fraction, by estimation, is 60 to 65%. The  ?left ventricle has normal function. The left ventricle has no regional  ?wall motion abnormalities. Left ventricular diastolic parameters are  ?consistent with Grade I diastolic  ?dysfunction (impaired relaxation).  ? 2. Right ventricular systolic function is normal. The right ventricular  ?size is normal. Tricuspid regurgitation signal is inadequate for assessing  ?PA pressure.  ? 3. The mitral valve is normal in structure. Trivial mitral valve   ?regurgitation. No evidence of mitral stenosis.  ? 4. The aortic valve is normal in structure. Aortic valve regurgitation is  ?not visualized. No aortic stenosis is present.  ? 5. The inferior vena cava is normal in size with greater than 50%  ?respiratory variability, suggesting right atrial pressure of 3 mmHg.  ? ?Patient Profile  ?   ?61 y.o. adult -identifies as female with hx of hypertension, tobacco abuse, chronic low back pain, hepatic steatosis, obesity followed for SVT. ? ?Assessment & Plan  ?  ?Supraventricular tachycardia ?-EKG series and telemetry reviewed does have QT prolongation on EKGs.  ?-Patient has had brief episodes of SVTs with rates up to 170s-- last run on 03/05/22 about 1 mon HR 139 to 150 pt unaware ?-She is largely asymptomatic, dyspnea is more likely from severe anemia ?-Echocardiogram is reassuring; she is not in heart failure clinically ?-Continue metoprolol, may uptitrate if needed though BP has not allowed this.  ?-Not a candidate for antiarrhythmic agent such as amiodarone given her liver disease ?-Agree with electrolytes replacement and work-up of unlderying etiology of liver disease ?-If the rhythm becomes more of an issue, blood pressure prevents up titration of AV nodal blockers, the choice would be antiarrhythmics or ablation.  Hopefully we will avoid this ? ?Na slightly low, Cr 0.71 and K+ 3.6 ?  ?Severe hepatic steatosis  ?Recurrent severe anemia ?Thrombocytopenia ?Recurrent electrolytes deficiency ?Hypoalbuminemia ?Bilateral lower extremity leg edema ?-Managed per primary team and GI ?  ?   ? ?For questions or updates, please contact [place] ?Please consult [email or web address] for contact info under  ? ?  ?   ?Signed, ?Cecilie Kicks, NP  ?08/04/2021, 8:46 AM    ? ?I have seen and examined the patient along with Cecilie Kicks, NP .  I have reviewed the chart, notes and new data.  I agree with PA/NP's note. ? ?Key new complaints: Unaware of any palpitations when she had the single  1 minute episode of SVT in the last 24 hours.  No cardiac complaints. ?Key examination changes: Pale, generalized edema, otherwise normal exam ?Key new findings / data: ECG/telemetry reviewed.  Narrow QRS, long RP tachycardia, triggered by PVCs.  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  Now on appropriate medications.  Increased adrenergic tone due to anemia for example can increase the  frequency of AVNRT.  The episodes are self-limited and they are not symptomatic.  ? ?PLAN: ?Continue current cardiac medications and will arrange outpatient follow-up. ? ?Sanda Klein, MD, [employer] [facility] ?[place] ?[phone] ?08/04/2021, 11:44 AM ? ?

## 2021-08-04 NOTE — Progress Notes (Incomplete)
?PROGRESS NOTE ? ? ? Laura [name]  ZXY:[id number] DOB: 1960/10/06 DOA: 07/29/2021 ?PCP: Lauree Chandler, NP  ? ? ?Chief Complaint  ?Patient presents with  ? Edema  ? ? ?Brief Narrative:  ?H&P per Dr. Flossie Buffy ?Laura [name] is a 61 y.o. adult female who identifies as female with medical history significant of hypertension who presents with concerns of increasing shortness of breath and edema. ?  ?Patient is a limited historian and not able to provide a good timeline.  He reports feeling short of breath with ambulation for some time and also has noted edema of his legs and hands.  Denies any chest pain or palpitation.  He was admitted mid March with hypotension, dehydration with electrolyte derangements from chronic diarrhea which he says is now resolved.  He also required transfusion after hemoglobin downward trended from 7.8-5.8 with hydration at that time. ?  ?On arrival to the ED, he was afebrile and had intermittent SVT with heart rate up to 170 with mild hypotension with SBP of 95 on room air. ?Mom mild leukocytosis of 11.1, hemoglobin of 6.9 from a prior of 9.1 last week.  Potassium of 3, magnesium is 0.9, corrected calcium of 8.3. ?He was noted to have mildly elevated AST of 44, total bilirubin has trended up from 3.1-5.1.  Alk phos also elevated at 244.  He denies any abdominal pain or symptoms. ?  ?Recent CT of the abdomen pelvis on 3/12 showed severe hepatic steatosis and hepatomegaly but no biliary obstruction. ?  ?Chest x-ray showed no pulmonary edema or infiltrate. ? ?Patient transfused 1 unit packed red blood cells. ?GI consultation obtained.  ? ? ?Assessment & Plan: ? Principal Problem: ?  Symptomatic anemia ?Active Problems: ?  Hypotension ?  Serum total bilirubin elevated ?  Hypokalemia ?  Hypomagnesemia ?  Elevated alkaline phosphatase level ?  SVT (supraventricular tachycardia) ([place]) ?  Hypocalcemia ?  Edema ?  UTI (urinary tract infection) ?  Folate deficiency ?  Conjunctivitis ?  Vitamin D deficiency ?   Erythema of abdominal wall ? ? ? ?Assessment and Plan: ?* Symptomatic anemia ?Hgb of 6.9 down from 9.1just last week on presentation. ? Required 2upRBC transfusion in last admission mid-March after he presented with hypotension and had downward trending Hgb following fluid resuscitation.   ?-He also had chronic diarrhea at that time and underwent colonoscopy and EGD on 07/01/2021 that was unremarkable.  FOBT x2 was negative at that time.  ?- He was noted to have folate deficiency and was started on supplementation at home. ?-Status post transfusion 1 unit packed red blood cells on admission with hemoglobin of 6.6 (07/31/2021). ?-Status post transfusion of 2 units packed red blood cells 07/31/2021 with hemoglobin currently at 9.4 this morning.  ?-Patient with no overt gross bleeding. ?-Patient with elevated bilirubin, MCV of 106.6, indirect bilirubin of 3.5.   ?-LDH of 219.  Haptoglobin within normal limits of 69.  Absolute reticulocyte count of 37.9.  Per hematology peripheral smear with no schistocytes noted.   ?-Ceruloplasmin level decreased at 15.7. ?-Status post IV copper 07/31/2021. ?-Patient seen in consultation by gastroenterology who are recommended hematology input.   ?-Patient seen in consultation by hematology who were following, and reviewed patient's labs and per note no evidence of hemolytic anemia.  ?-Per hematology patient with folate deficiency as well as low ceruloplasmin, patient on folic acid as well as received copper supplementation and feel patient's anemia is likely secondary to anemia of chronic disease secondary to multiple medical  issues including liver disease.  ?-Hematology recommending supportive care and has signed off as of 08/01/2021.  ?-Transfusion threshold hemoglobin < 7. ?-Continue folic acid 1 mg daily. ?-If continued drop in hemoglobin may need capsule endoscopy for further evaluation per GI/hematology. ?-GI following and appreciate input and recommendations. ?- ? ?Hypotension ?SBP  95 and improved with transfusion. ?Blood pressure improvement. ?Albumin of 2.3. ?Status post IV albumin twice daily x2 days. ? ? ?Serum total bilirubin elevated ?Total bilirubin of 5.4 on admission, which has trended up from 3.1 several weeks ago.   ?-Total bilirubin fluctuated was 5.4 >>>> 6.5 >>>> 8.0>>> 5.2>>> 7.9 >>>> 5.9>>>>6.6 ?-  Alk phosphatase levels elevated but trending down. ?- recently had a CT abdomen and pelvis on 3/12 that showed severe hepatic steatosis and hepatomegaly but no biliary dilatation.  ?-MRCP done with no findings to suggest biliary tract obstruction, severe hepatomegaly with severe hepatic steatosis, trace volume ascites.  ?-Bilirubin fluctuating trending back down currently at 7.9 from 5.2 from 8.0, indirect and direct bilirubin levels fluctuating. AST of 46. ?-Haptoglobin within normal limits..  LDH of 219.  Ceruloplasmin level of 15.7. ?-Patient received a dose of IV copper 07/31/2021 per hematology. ?-IgG serum of 1325.  Alpha 1 antitrypsin of 181.  ANA negative. ?-24-hour urine copper study pending to evaluate for Wilson's disease per GI, hemochromatosis evaluation also pending per GI. ?-Urinary copper 24-hour discontinued per GI as patient had received IV copper. ?-May need liver biopsy. ?-GI consulted and following.  ? ?Erythema of abdominal wall ?- Patient with erythema with some skin tears with small amount of purulent drainage noted on lower abdominal wall fold extending into the mons pubis and groin region concerning for possible candidiasis secondary bacterial process.  Also noted to be malodorous. ?-Some clinical improvement. ?-Patient currently on ciprofloxacin for UTI. ?-Continue nystatin powder to the region. ?-Continue doxycycline. ?-Follow. ? ?Vitamin D deficiency ?Vitamin D level of 11.42. ?Vitamin D supplementation with vitamin D 3 50,000 units weekly. ?-Outpatient follow-up with PCP ? ?Conjunctivitis ?- Change cipro eye drops to 5 times daily, day 3/7. ? ?Folate  deficiency ?- Continue folic acid 1 mg daily. ? ?UTI (urinary tract infection) ?Urine culture with > 100,000 colonies of Proteus Mirabilis and Klebsiella pneumonia with resistance to Macrobid and ampicillin.   ?-Continue ciprofloxacin and treat for total of 7 days.   ? ?Edema ?Possible third spacing or potential hepatic etiology. BNP is low and no CXR finding of edema to suggest cardiac cause.  ?Unable to diuresis while receiving blood transfusion  ?-2D echo with normal EF,NWMA, grade 1 diastolic dysfunction. ?-Continue IV albumin twice daily x2 days. ?-Patient given Lasix in between transfusions of packed red blood cells. ?-Follow. ? ? ? ?Hypocalcemia ?Corrected Ca of 9.64. ?-Repleted with IV Calcium gluconate.  ?-Patient with vitamin D deficiency and placed on oral vitamin D supplementation.  ?-Repeat labs in the AM. ? ?SVT (supraventricular tachycardia) ([place]) ?Patient having intermittent nonsustained SVT likely secondary to electrolyte derangement in the setting of symptomatic anemia. Unclear cause of low electrolytes since she is not having any GI loss or renal loss. ?-Patient with a 40 beat run of SVT in the morning of 07/31/2021 however remained asymptomatic.  ?-No SVT noted on telemetry since 08/01/2021 ?-Magnesium admission 0.9 >>>1.2>>>> 2.0>>>> 1.5>> 2.1>>>> 1.8, potassium at 3.0>>> 3.2>>>> 3.5>>> 3.6>>>> 3.2>>> 4.1>>> 4.2 ?-Goal to keep potassium approximately 4, magnesium approximately 2.  ?-Magnesium at 1.8 today. ?-Magnesium sulfate 2 g IV x1 ?-Continue oral magnesium supplementation. ?-2D echo done with  normal EF,NWMA, grade 1 diastolic dysfunction. ?-Patient status post total transfusion 3 units packed red blood cells with hemoglobin at 9.4 this morning.  ?-Continue low-dose Lopressor 12.5 mg twice daily.  ?-Patient seen in consultation by cardiology who recommended keeping on low-dose metoprolol and uptitrate as blood pressure allows.  ?-Per cardiology patient is not a candidate at present time for  invasive treatment given severity of other problems and recommending optimization of electrolytes and keeping hemoglobin above 7.  ?-Cardiology following and appreciate input and recommendations.   ? ?Elevate

## 2021-08-04 NOTE — Care Management Important Message (Signed)
Important Message ? ?Patient Details  ?Name: Laura [name] ?MRN: [id number] ?Date of Birth: 1960-07-22 ? ? ?Medicare Important Message Given:  Yes ? ? ? ? ?Renie Ora ?08/04/2021, 11:55 AM ?

## 2021-08-04 NOTE — Progress Notes (Signed)
Discharge instructions reviewed with pt and family at bedside.  ?Copy of instructions given to pt. Medications delivered to pt's room by [facility]. ? ?Pt d/c'd via wheelchair with belongings, with family.            ?Escorted by hospital volunteer.  ?

## 2021-08-04 NOTE — Telephone Encounter (Signed)
Laura [name], remains in the hospital, not sure when she will be discharged. Pls schedule her for a follow up appointment with Dr. Tomasa Rand in 2 to 3 weeks. THX  ?

## 2021-08-04 NOTE — Discharge Instructions (Signed)
Appt with Dr. Tenny Craw  Aug 19, 2021 1215 p with her PA Jacolyn Reedy  ?

## 2021-08-04 NOTE — TOC Transition Note (Addendum)
Transition of Care (TOC) - CM/SW Discharge Note ? ? ?Patient Details  ?Name: Genessa Beman ?MRN: [id number] ?Date of Birth: October 11, 1960 ? ?Transition of Care (TOC) CM/SW Contact:  ?Leone Haven, RN ?Phone Number: ?08/04/2021, 11:30 AM ? ? ?Clinical Narrative:    ?Patient is for dc today, NCM notified [place] with [employer].  Patient states, neighbor will be transporting her home.  She states the [facility] is at home already.  NCM asked Frances Furbish to add HHRN to services.  ? ? ?Final next level of care: Home w Home Health Services ?Barriers to Discharge: Continued Medical Work up ? ? ?Patient Goals and CMS Choice ?Patient states their goals for this hospitalization and ongoing recovery are:: return home ?CMS Medicare.gov Compare Post Acute Care list provided to:: Patient ?Choice offered to / list presented to : Patient ? ?Discharge Placement ?  ?           ?  ?  ?  ?  ? ?Discharge Plan and Services ?  ?Discharge Planning Services: CM Consult ?Post Acute Care Choice: Home Health, Durable Medical Equipment          ?DME Arranged: Tub bench ?DME Agency: [employer] ?Date DME Agency Contacted: 07/31/21 ?Time DME Agency Contacted: [phone] ?Representative spoke with at DME Agency: kim ?HH Arranged: PT, OT, RN ?HH Agency: [facility] ?Date HH Agency Contacted: 07/31/21 ?Time HH Agency Contacted: 1615 ?Representative spoke with at [facility] Agency: Kandee Keen ? ?Social Determinants of Health (SDOH) Interventions ?  ? ? ?Readmission Risk Interventions ? ?  07/02/2021  ? 11:32 AM  ?Readmission Risk Prevention Plan  ?Post Dischage Appt Complete  ?Medication Screening Complete  ?Transportation Screening Complete  ? ? ? ? ? ?

## 2021-08-04 NOTE — Discharge Summary (Signed)
Physician Discharge Summary  ?Laura [name] ZOX:[id number] DOB: [birth year] DOA: 07/29/2021 ? ?PCP: Lauree Chandler, NP ? ?Admit date: 07/29/2021 ?Discharge date: 08/04/2021 ? ?Time spent: 60 minutes ? ?Recommendations for Outpatient Follow-up:  ?Follow-up with Lauree Chandler, NP in 1 to 2 weeks.  Patient will need a CBC, basic metabolic profile done on follow-up 0.1-week to follow-up on electrolytes, renal function, hemoglobin.  Patient also need a magnesium level checked.  Vitamin D deficiency will need to be followed up upon. ?Follow-up with Dr. Candis Schatz, gastroenterology in 2 to 3 weeks.  On follow-up patient will need a comprehensive metabolic profile done to follow-up on electrolytes, renal function, LFTs. ?Follow-up with Dr. Harrington Challenger, cardiology in 3 weeks.  Office will call with appointment time. ? ? ?Discharge Diagnoses:  ?Principal Problem: ?  Symptomatic anemia ?Active Problems: ?  Hypotension ?  Serum total bilirubin elevated ?  Hypokalemia ?  Hypomagnesemia ?  Elevated alkaline phosphatase level ?  SVT (supraventricular tachycardia) ([place]) ?  Hypocalcemia ?  Edema ?  UTI (urinary tract infection) ?  Folate deficiency ?  Conjunctivitis ?  Vitamin D deficiency ?  Erythema of abdominal wall ? ? ?Discharge Condition: Stable and improved ? ?Diet recommendation: Heart healthy ? ?Filed Weights  ? 08/02/21 0411 08/03/21 0353 08/04/21 0530  ?Weight: 77.4 kg 76.9 kg 76.9 kg  ? ? ?History of present illness:  ?HPI per Dr.Tu ? Laura [name] is a 61 y.o. adult female who identifies as female with medical history significant of hypertension who presented with concerns of increasing shortness of breath and edema. ?  ?Patient is a limited historian and not able to provide a good timeline.  He reported feeling short of breath with ambulation for some time and also has noted edema of his legs and hands.  Denied any chest pain or palpitation.  He was admitted mid March with hypotension, dehydration with electrolyte derangements  from chronic diarrhea which he says is now resolved.  He also required transfusion after hemoglobin downward trended from 7.8-5.8 with hydration at that time. ?  ?On arrival to the ED, he was afebrile and had intermittent SVT with heart rate up to 170 with mild hypotension with SBP of 95 on room air. ?Mild leukocytosis of 11.1, hemoglobin of 6.9 from a prior of 9.1 last week.  Potassium of 3, magnesium is 0.9, corrected calcium of 8.3. ?He was noted to have mildly elevated AST of 44, total bilirubin has trended up from 3.1-5.1.  Alk phos also elevated at 244.  He denies any abdominal pain or symptoms. ?  ?Recent CT of the abdomen pelvis on 3/12 showed severe hepatic steatosis and hepatomegaly but no biliary obstruction. ?  ?Chest x-ray showed no pulmonary edema or infiltrate. ?  ?Hospital Course:  ? ?Assessment and Plan: ?* Symptomatic anemia ?Hgb of 6.9 down from 9.1just last week on presentation. ? Required 2upRBC transfusion in last admission mid-March after he presented with hypotension and had downward trending Hgb following fluid resuscitation.   ?-He also had chronic diarrhea at that time and underwent colonoscopy and EGD on 07/01/2021 that was unremarkable.  FOBT x2 was negative at that time.  ?- He was noted to have folate deficiency and was started on supplementation at home. ?-Status post transfusion 1 unit packed red blood cells on admission with hemoglobin of 6.6 (07/31/2021). ?-Status post transfusion of 2 units packed red blood cells 07/31/2021 with hemoglobin currently at 9.4 this morning.  ?-Patient with no overt gross bleeding. ?-Patient with elevated  bilirubin, MCV of 106.6, indirect bilirubin of 3.5.   ?-LDH of 219.  Haptoglobin within normal limits of 69.  Absolute reticulocyte count of 37.9.  Per hematology peripheral smear with no schistocytes noted.   ?-Ceruloplasmin level decreased at 15.7. ?-Status post IV copper 07/31/2021. ?-Patient seen in consultation by gastroenterology who are recommended  hematology input.   ?-Patient seen in consultation by hematology who were following, and reviewed patient's labs and per note no evidence of hemolytic anemia.  ?-Per hematology patient with folate deficiency as well as low ceruloplasmin, patient on folic acid as well as received copper supplementation and feel patient's anemia is likely secondary to anemia of chronic disease secondary to multiple medical issues including liver disease.  ?-Hematology recommended supportive care and has signed off as of 08/01/2021.  ?-Transfusion threshold hemoglobin < 7. ?-Patient was maintained on folic acid 1 mg daily. ?-If continued drop in hemoglobin may need capsule endoscopy for further evaluation per GI/hematology. ?-Patient was followed by gastroenterology who followed the patient throughout the hospitalization.  Hemoglobin fluctuated but stabilized and was 7.9 by day of discharge. ?-Per GI no plans for further assessment of GI bleed during this hospitalization and patient set up to follow-up in the outpatient setting in 2 to 3 weeks for further management. ?-Patient also follow-up with PCP in 1 to 2 weeks and will need a CBC done in 1 week. ?-Patient be discharged in stable and improved condition. ?-GI following and appreciate input and recommendations. ?- ? ?Hypotension ?SBP 95 and improved with transfusion. ?Blood pressure improvement. ?Albumin of 2.3. ?Status post IV albumin twice daily x2 days. ? ? ?Serum total bilirubin elevated ?Total bilirubin of 5.4 on admission, which has trended up from 3.1 several weeks ago.   ?-Total bilirubin fluctuated was 5.4 >>>> 6.5 >>>> 8.0>>> 5.2>>> 7.9 >>>> 5.9>>>>6.6>>> 4.2 ?-  Alk phosphatase levels elevated but trended down. ?- recently had a CT abdomen and pelvis on 3/12 that showed severe hepatic steatosis and hepatomegaly but no biliary dilatation.  ?-MRCP done with no findings to suggest biliary tract obstruction, severe hepatomegaly with severe hepatic steatosis, trace volume  ascites.  ?-Bilirubin fluctuating trending back down currently at 7.9 from 5.2 from 8.0, indirect and direct bilirubin levels fluctuating. AST of 46. ?-Haptoglobin within normal limits..  LDH of 219.  Ceruloplasmin level of 15.7. ?-Patient received a dose of IV copper 07/31/2021 per hematology. ?-IgG serum of 1325.  Alpha 1 antitrypsin of 181.  ANA negative. ?-24-hour urine copper study pending to evaluate for Wilson's disease per GI, hemochromatosis evaluation also pending per GI. ?-Urinary copper 24-hour discontinued per GI as patient had received IV copper. ?-May need liver biopsy. ?-Patient was followed by GI throughout the hospitalization. ?-Outpatient follow-up with GI in 2 to 3 weeks. ? ?Erythema of abdominal wall ?- Patient with erythema with some skin tears with small amount of purulent drainage noted on lower abdominal wall fold extending into the mons pubis and groin region concerning for possible candidiasis secondary bacterial process.  Also noted to be malodorous. ?-Some clinical improvement. ?-Patient was on ciprofloxacin and doxycycline added as well as placed on nystatin powder to the area.   ?-Cipro and doxycycline was subsequently changed and patient placed on Bactrim for 5 more days to complete a 10-day course of antibiotic treatment.   ?-Patient also given IV Diflucan 200 mg x 1 and will be discharged home on 6 more days of oral Diflucan.   ?-Patient seen by [place] RN who recommended Interdry to fit  in skin folds.  ?-Outpatient follow-up with PCP.  ? ?Vitamin D deficiency ?Vitamin D level of 11.42. ?Vitamin D supplementation with vitamin D 3 50,000 units weekly. ?-Outpatient follow-up with PCP ? ?Conjunctivitis ?- Patient placed on ciprofloxacin eyedrops during the hospitalization with discharge home to complete a course of treatment.   ? ?Folate deficiency ?-Patient placed on folic acid 1 mg daily. ? ?UTI (urinary tract infection) ?Urine culture with > 100,000 colonies of Proteus Mirabilis and  Klebsiella pneumonia with resistance to Macrobid and ampicillin.   ?-Patient received a full course of treatment with ciprofloxacin.  -Outpatient follow-up.   ? ?Edema ?Possible third spacing or potential hepati

## 2021-08-04 NOTE — Consult Note (Signed)
WOC Nurse Consult Note: ?Reason for Consult:erythema intertrigo ? ?ICD-10 CM Codes for Irritant Dermatitis ? ?L30.4  - Erythema intertrigo. Also used for abrasion of the hand, chafing of the skin, dermatitis due to sweating and friction, friction dermatitis, friction eczema, and genital/thigh intertrigo.  ? ?Wound type: irritant contact dermatitis, moisture in skin folds at inguinal labial and medial thighs ?Pressure Injury POA:N/A ?Measurement:Diffuse rash at medial thighs, inguinal areas and perineum, erythematous ?Wound bed: scattered partial thickness ?Drainage (amount, consistency, odor) scant serous ?Periwound: intact, dry ?Dressing procedure/placement/frequency: I have provided our house antimicrobial moisture wicking textile, Phil Dopp Kellie Simmering # [phone]) for this patient and have provided Nursing with guidance for use as indicated below: ? ?Measure and cut length of InterDry to fit in skin folds that have skin breakdown ?Tuck InterDry fabric into skin folds in a single layer, allow for 2 inches of overhang from skin edges to allow for wicking to occur ?May remove to bathe; dry area thoroughly and then tuck into affected areas again ?Do not apply any creams or ointments when using InterDry ?DO NOT THROW AWAY FOR 5 DAYS unless soiled with stool ?DO NOT [facility] product, this will inactivate the silver in the material  ?New sheet of Interdry should be applied after 5 days of use if patient continues to have skin breakdown  ?  ?Additionally and if you agree, recommend a few doses/days of systemic antifungal therapy (e.g., Diflucan) if not contraindicated.   ? ?[place] nursing team will not follow, but will remain available to this patient, the nursing and medical teams.  Please re-consult if needed. ?Thanks, ?Maudie Flakes, MSN, RN, [place], [place], CWON-AP, [place]  ?Pager# [phone]  ? ? ? ?  ?

## 2021-08-04 NOTE — Plan of Care (Signed)
  Problem: Clinical Measurements: Goal: Diagnostic test results will improve Outcome: Progressing Goal: Respiratory complications will improve Outcome: Progressing   

## 2021-08-05 ENCOUNTER — Telehealth: Payer: Self-pay | Admitting: *Deleted

## 2021-08-05 DIAGNOSIS — E538 Deficiency of other specified B group vitamins: Secondary | ICD-10-CM | POA: Diagnosis not present

## 2021-08-05 DIAGNOSIS — I10 Essential (primary) hypertension: Secondary | ICD-10-CM | POA: Diagnosis not present

## 2021-08-05 DIAGNOSIS — D696 Thrombocytopenia, unspecified: Secondary | ICD-10-CM | POA: Diagnosis not present

## 2021-08-05 DIAGNOSIS — E860 Dehydration: Secondary | ICD-10-CM | POA: Diagnosis not present

## 2021-08-05 DIAGNOSIS — E872 Acidosis, unspecified: Secondary | ICD-10-CM | POA: Diagnosis not present

## 2021-08-05 DIAGNOSIS — K529 Noninfective gastroenteritis and colitis, unspecified: Secondary | ICD-10-CM | POA: Diagnosis not present

## 2021-08-05 DIAGNOSIS — I959 Hypotension, unspecified: Secondary | ICD-10-CM | POA: Diagnosis not present

## 2021-08-05 DIAGNOSIS — D539 Nutritional anemia, unspecified: Secondary | ICD-10-CM | POA: Diagnosis not present

## 2021-08-05 NOTE — Telephone Encounter (Signed)
Transition Care Management Unsuccessful Follow-up Telephone Call ? ?Date of discharge and from where:  08/04/2021 [place] ? ?Attempts:  1st Attempt ? ?Reason for unsuccessful TCM follow-up call:  Left voice message to return call.  ? ?  ?

## 2021-08-06 DIAGNOSIS — K760 Fatty (change of) liver, not elsewhere classified: Secondary | ICD-10-CM | POA: Diagnosis not present

## 2021-08-06 DIAGNOSIS — I10 Essential (primary) hypertension: Secondary | ICD-10-CM | POA: Diagnosis not present

## 2021-08-06 DIAGNOSIS — E860 Dehydration: Secondary | ICD-10-CM | POA: Diagnosis not present

## 2021-08-06 DIAGNOSIS — E538 Deficiency of other specified B group vitamins: Secondary | ICD-10-CM | POA: Diagnosis not present

## 2021-08-06 DIAGNOSIS — E872 Acidosis, unspecified: Secondary | ICD-10-CM | POA: Diagnosis not present

## 2021-08-06 DIAGNOSIS — G8929 Other chronic pain: Secondary | ICD-10-CM | POA: Diagnosis not present

## 2021-08-06 DIAGNOSIS — Z87891 Personal history of nicotine dependence: Secondary | ICD-10-CM | POA: Diagnosis not present

## 2021-08-06 DIAGNOSIS — M545 Low back pain, unspecified: Secondary | ICD-10-CM | POA: Diagnosis not present

## 2021-08-06 DIAGNOSIS — K529 Noninfective gastroenteritis and colitis, unspecified: Secondary | ICD-10-CM | POA: Diagnosis not present

## 2021-08-06 DIAGNOSIS — Z9181 History of falling: Secondary | ICD-10-CM | POA: Diagnosis not present

## 2021-08-06 DIAGNOSIS — I959 Hypotension, unspecified: Secondary | ICD-10-CM | POA: Diagnosis not present

## 2021-08-06 DIAGNOSIS — D696 Thrombocytopenia, unspecified: Secondary | ICD-10-CM | POA: Diagnosis not present

## 2021-08-06 DIAGNOSIS — G4700 Insomnia, unspecified: Secondary | ICD-10-CM | POA: Diagnosis not present

## 2021-08-06 DIAGNOSIS — D539 Nutritional anemia, unspecified: Secondary | ICD-10-CM | POA: Diagnosis not present

## 2021-08-06 NOTE — Telephone Encounter (Signed)
Called and spoke with patient and canceled lab appointment for 08/11/2021. ?

## 2021-08-06 NOTE — Telephone Encounter (Signed)
Transition Care Management Follow-up Telephone Call ?Date of discharge and from where: 08/04/2021 [place] ?How have you been since you were released from the hospital? better ?Any questions or concerns? No ? ?Items Reviewed: ?Did the pt receive and understand the discharge instructions provided? Yes  ?Medications obtained and verified? Yes  ?Other? No  ?Any new allergies since your discharge? No  ?Dietary orders reviewed? Yes ?Do you have support at home? Yes  ? ?Home Care and Equipment/Supplies: ?Were home health services ordered? yes ?If so, what is the name of the agency? Patient is not sure of the name, they are coming out today.   ?Has the agency set up a time to come to the patient's home? yes ?Were any new equipment or medical supplies ordered?  No ?What is the name of the medical supply agency? na ?Were you able to get the supplies/equipment? not applicable ?Do you have any questions related to the use of the equipment or supplies? No ? ?Functional Questionnaire: (I = Independent and D = Dependent) ?ADLs: I with assistance ? ?Bathing/Dressing- I ? ?Meal Prep- I ? ?Eating- I ? ?Maintaining continence- I ? ?Transferring/Ambulation- I with assistance ? ?Managing Meds- I ? ?Follow up appointments reviewed: ? ?PCP Hospital f/u appt confirmed? Yes  patient stated that she will call back to schedule.  ?[facility] f/u appt confirmed? No   ?Are transportation arrangements needed? No  ?If their condition worsens, is the pt aware to call PCP or go to the Emergency Dept.? Yes ?Was the patient provided with contact information for the PCP's office or ED? Yes ?Was to pt encouraged to call back with questions or concerns? Yes  ?

## 2021-08-06 NOTE — Telephone Encounter (Signed)
Please cancel lab work appt, will be assessed at appt.  ?

## 2021-08-06 NOTE — Telephone Encounter (Signed)
Patient has an appointment scheduled 4/24 for labs and 4/28 for TOC.  ? ?Appointment was scheduled but no orders placed. Please place orders for [facility] 4/24.  ?

## 2021-08-07 ENCOUNTER — Telehealth: Payer: Self-pay | Admitting: *Deleted

## 2021-08-07 DIAGNOSIS — K529 Noninfective gastroenteritis and colitis, unspecified: Secondary | ICD-10-CM | POA: Diagnosis not present

## 2021-08-07 DIAGNOSIS — I10 Essential (primary) hypertension: Secondary | ICD-10-CM | POA: Diagnosis not present

## 2021-08-07 DIAGNOSIS — I959 Hypotension, unspecified: Secondary | ICD-10-CM | POA: Diagnosis not present

## 2021-08-07 DIAGNOSIS — Z87891 Personal history of nicotine dependence: Secondary | ICD-10-CM | POA: Diagnosis not present

## 2021-08-07 DIAGNOSIS — Z9181 History of falling: Secondary | ICD-10-CM | POA: Diagnosis not present

## 2021-08-07 DIAGNOSIS — E872 Acidosis, unspecified: Secondary | ICD-10-CM | POA: Diagnosis not present

## 2021-08-07 DIAGNOSIS — G8929 Other chronic pain: Secondary | ICD-10-CM | POA: Diagnosis not present

## 2021-08-07 DIAGNOSIS — M545 Low back pain, unspecified: Secondary | ICD-10-CM | POA: Diagnosis not present

## 2021-08-07 DIAGNOSIS — E860 Dehydration: Secondary | ICD-10-CM | POA: Diagnosis not present

## 2021-08-07 DIAGNOSIS — K760 Fatty (change of) liver, not elsewhere classified: Secondary | ICD-10-CM | POA: Diagnosis not present

## 2021-08-07 DIAGNOSIS — D539 Nutritional anemia, unspecified: Secondary | ICD-10-CM | POA: Diagnosis not present

## 2021-08-07 DIAGNOSIS — E538 Deficiency of other specified B group vitamins: Secondary | ICD-10-CM | POA: Diagnosis not present

## 2021-08-07 DIAGNOSIS — G4700 Insomnia, unspecified: Secondary | ICD-10-CM | POA: Diagnosis not present

## 2021-08-07 DIAGNOSIS — D696 Thrombocytopenia, unspecified: Secondary | ICD-10-CM | POA: Diagnosis not present

## 2021-08-07 NOTE — Telephone Encounter (Signed)
April with [facility] called requesting verbal order to continue Nursing 1x4weeks.  ? ?Verbal order given.  ?

## 2021-08-08 DIAGNOSIS — G8929 Other chronic pain: Secondary | ICD-10-CM | POA: Diagnosis not present

## 2021-08-08 DIAGNOSIS — D539 Nutritional anemia, unspecified: Secondary | ICD-10-CM | POA: Diagnosis not present

## 2021-08-08 DIAGNOSIS — M545 Low back pain, unspecified: Secondary | ICD-10-CM | POA: Diagnosis not present

## 2021-08-08 DIAGNOSIS — Z9181 History of falling: Secondary | ICD-10-CM | POA: Diagnosis not present

## 2021-08-08 DIAGNOSIS — I10 Essential (primary) hypertension: Secondary | ICD-10-CM | POA: Diagnosis not present

## 2021-08-08 DIAGNOSIS — E538 Deficiency of other specified B group vitamins: Secondary | ICD-10-CM | POA: Diagnosis not present

## 2021-08-08 DIAGNOSIS — G4700 Insomnia, unspecified: Secondary | ICD-10-CM | POA: Diagnosis not present

## 2021-08-08 DIAGNOSIS — Z87891 Personal history of nicotine dependence: Secondary | ICD-10-CM | POA: Diagnosis not present

## 2021-08-08 DIAGNOSIS — E872 Acidosis, unspecified: Secondary | ICD-10-CM | POA: Diagnosis not present

## 2021-08-08 DIAGNOSIS — E860 Dehydration: Secondary | ICD-10-CM | POA: Diagnosis not present

## 2021-08-08 DIAGNOSIS — K529 Noninfective gastroenteritis and colitis, unspecified: Secondary | ICD-10-CM | POA: Diagnosis not present

## 2021-08-08 DIAGNOSIS — I959 Hypotension, unspecified: Secondary | ICD-10-CM | POA: Diagnosis not present

## 2021-08-08 DIAGNOSIS — K760 Fatty (change of) liver, not elsewhere classified: Secondary | ICD-10-CM | POA: Diagnosis not present

## 2021-08-08 DIAGNOSIS — D696 Thrombocytopenia, unspecified: Secondary | ICD-10-CM | POA: Diagnosis not present

## 2021-08-08 LAB — HEMOCHROMATOSIS DNA-PCR(C282Y,H63D)

## 2021-08-11 ENCOUNTER — Other Ambulatory Visit: Payer: Medicare Other

## 2021-08-13 DIAGNOSIS — G4700 Insomnia, unspecified: Secondary | ICD-10-CM | POA: Diagnosis not present

## 2021-08-13 DIAGNOSIS — D696 Thrombocytopenia, unspecified: Secondary | ICD-10-CM | POA: Diagnosis not present

## 2021-08-13 DIAGNOSIS — E860 Dehydration: Secondary | ICD-10-CM | POA: Diagnosis not present

## 2021-08-13 DIAGNOSIS — K529 Noninfective gastroenteritis and colitis, unspecified: Secondary | ICD-10-CM | POA: Diagnosis not present

## 2021-08-13 DIAGNOSIS — M545 Low back pain, unspecified: Secondary | ICD-10-CM | POA: Diagnosis not present

## 2021-08-13 DIAGNOSIS — I959 Hypotension, unspecified: Secondary | ICD-10-CM | POA: Diagnosis not present

## 2021-08-13 DIAGNOSIS — E538 Deficiency of other specified B group vitamins: Secondary | ICD-10-CM | POA: Diagnosis not present

## 2021-08-13 DIAGNOSIS — Z9181 History of falling: Secondary | ICD-10-CM | POA: Diagnosis not present

## 2021-08-13 DIAGNOSIS — E872 Acidosis, unspecified: Secondary | ICD-10-CM | POA: Diagnosis not present

## 2021-08-13 DIAGNOSIS — K760 Fatty (change of) liver, not elsewhere classified: Secondary | ICD-10-CM | POA: Diagnosis not present

## 2021-08-13 DIAGNOSIS — D539 Nutritional anemia, unspecified: Secondary | ICD-10-CM | POA: Diagnosis not present

## 2021-08-13 DIAGNOSIS — I10 Essential (primary) hypertension: Secondary | ICD-10-CM | POA: Diagnosis not present

## 2021-08-13 DIAGNOSIS — Z87891 Personal history of nicotine dependence: Secondary | ICD-10-CM | POA: Diagnosis not present

## 2021-08-13 DIAGNOSIS — G8929 Other chronic pain: Secondary | ICD-10-CM | POA: Diagnosis not present
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
## 2021-08-15 ENCOUNTER — Encounter: Payer: Self-pay | Admitting: Nurse Practitioner

## 2021-08-15 ENCOUNTER — Ambulatory Visit (INDEPENDENT_AMBULATORY_CARE_PROVIDER_SITE_OTHER): Payer: Medicare Other | Admitting: Nurse Practitioner

## 2021-08-15 ENCOUNTER — Other Ambulatory Visit: Payer: Medicare Other

## 2021-08-15 VITALS — BP 130/78 | HR 76 | Temp 97.3°F

## 2021-08-15 DIAGNOSIS — B372 Candidiasis of skin and nail: Secondary | ICD-10-CM

## 2021-08-15 DIAGNOSIS — E559 Vitamin D deficiency, unspecified: Secondary | ICD-10-CM

## 2021-08-15 DIAGNOSIS — R600 Localized edema: Secondary | ICD-10-CM | POA: Diagnosis not present

## 2021-08-15 DIAGNOSIS — E538 Deficiency of other specified B group vitamins: Secondary | ICD-10-CM

## 2021-08-15 DIAGNOSIS — E876 Hypokalemia: Secondary | ICD-10-CM | POA: Diagnosis not present

## 2021-08-15 DIAGNOSIS — I471 Supraventricular tachycardia: Secondary | ICD-10-CM | POA: Diagnosis not present

## 2021-08-15 DIAGNOSIS — E440 Moderate protein-calorie malnutrition: Secondary | ICD-10-CM

## 2021-08-15 DIAGNOSIS — I10 Essential (primary) hypertension: Secondary | ICD-10-CM | POA: Diagnosis not present

## 2021-08-15 DIAGNOSIS — K760 Fatty (change of) liver, not elsewhere classified: Secondary | ICD-10-CM | POA: Diagnosis not present

## 2021-08-15 DIAGNOSIS — R197 Diarrhea, unspecified: Secondary | ICD-10-CM

## 2021-08-15 DIAGNOSIS — D519 Vitamin B12 deficiency anemia, unspecified: Secondary | ICD-10-CM

## 2021-08-15 MED ORDER — NYSTATIN 100000 UNIT/GM EX POWD: 1.0000 "application " | Freq: Three times a day (TID) | CUTANEOUS | 1 refills | Status: DC

## 2021-08-15 NOTE — Patient Instructions (Addendum)
Continue vit D 50,000 units weekly then start Vit D 2,000 units daily  ? ?Increase protein in diet ?Compression hose daily- on first thing in the morning  ?-encouraged to elevate legs above level of heart as tolerates ?low sodium diet ? ?

## 2021-08-15 NOTE — Progress Notes (Signed)
? ? ?Careteam: ?Patient Care Team: ?Sharon Seller, NP as PCP - General (Geriatric Medicine) ?Pricilla Riffle, MD as PCP - Cardiology (Cardiology) ? ?PLACE OF SERVICE:  ?[facility]  ?Advanced Directive information ?Does Patient Have a Medical Advance Directive?: No, Would patient like information on creating a medical advance directive?: No - Patient declined ? ?Allergies  ?Allergen Reactions  ? Garlic Anaphylaxis  ? Aspirin Hives  ? Lyrica [Pregabalin]   ?  Nightmares, did not help neuropathy  ? Penicillins Hives  ? ? ?Chief Complaint  ?Patient presents with  ? Transitions Of Care  ?  Hospital follow-up from recent stay 07/29/21-08/04/21. Patient c/o rash in private area. Discuss furosemide. Patient c/o bilateral feet and leg swelling.   ? ? ? ?HPI: Patient is a 61 y.o. adult for hospital follow up. Has had 2 recent hospitalization due to multiple medical issues. He was admitted mid March with hypotension, dehydration with electrolyte derangements from chronic diarrhea which he says is now resolved.  He also required transfusion after hemoglobin downward trended from 7.8-5.8 with hydration at that time. ?Most recent hospitalization was due to shortness of breath with significant anemia to arms and legs and found to have symptomatic anemia with hgb of 6.9 down from 9 and hypotension. Also with acute changes in liver enzymes. In regards to anemia had multiple blood transfusion. Endoscopy and colonoscopy were negative for finding. Taking iron supplement twice daily  ?Had significant edema thought to be from 3rd spacing, malnutrition.  ? ?Reports concern today is that LE edema is ongoing.  ?Found to be Vit D def- has started on Vit D  50,000 units weekly at this time.  ? ?Mag noted to be low- on twice daily supplement.  ? ?Folate def- on supplement daily  ? ?Yeast has improved significantly but continues to be there, using nystatin powder, request refill.  ? ?Has not followed up with cardiologist or GI doctor yet.  Plans to schedule follow up ?Had SVT during hospitalization thought to be due to electrolyte abnormalities.  ? ?Review of Systems:  ?Review of Systems  ?Constitutional:  Negative for chills, fever and weight loss.  ?HENT:  Negative for tinnitus.   ?Respiratory:  Negative for cough, sputum production and shortness of breath.   ?Cardiovascular:  Positive for leg swelling. Negative for chest pain and palpitations.  ?Gastrointestinal:  Negative for abdominal pain, constipation, diarrhea and heartburn.  ?Genitourinary:  Negative for dysuria, frequency and urgency.  ?Musculoskeletal:  Negative for back pain, falls, joint pain and myalgias.  ?Skin:  Positive for itching and rash.  ?Neurological:  Positive for weakness. Negative for dizziness and headaches.  ?Psychiatric/Behavioral:  Negative for depression and memory loss. The patient does not have insomnia.   ? ?Past Medical History:  ?Diagnosis Date  ? Acute bronchitis   ? Cervical high risk HPV (human papillomavirus) test positive 12/2017  ? Negative subtype 16, 18/45  ? Chronic back pain   ? Complication of anesthesia   ? "didn't wake up"  ? Cough   ? Insomnia, unspecified   ? Other malaise and fatigue   ? Other specified diseases of blood and blood-forming organs(289.89)   ? Tobacco use disorder   ? ?Past Surgical History:  ?Procedure Laterality Date  ? BIOPSY  07/01/2021  ? Procedure: BIOPSY;  Surgeon: Jeani Hawking, MD;  Location: [facility] ENDOSCOPY;  Service: Gastroenterology;;  EGD and COLON  ? COLONOSCOPY WITH PROPOFOL N/A 07/01/2021  ? Procedure: COLONOSCOPY WITH PROPOFOL;  Surgeon: Jeani Hawking,  MD;  Location: [facility] ENDOSCOPY;  Service: Gastroenterology;  Laterality: N/A;  ? ESOPHAGOGASTRODUODENOSCOPY N/A 03/17/2013  ? Procedure: ESOPHAGOGASTRODUODENOSCOPY (EGD);  Surgeon: Meryl Dare, MD;  Location: [facility] ENDOSCOPY;  Service: Endoscopy;  Laterality: N/A;  ? ESOPHAGOGASTRODUODENOSCOPY (EGD) WITH PROPOFOL N/A 07/01/2021  ? Procedure: ESOPHAGOGASTRODUODENOSCOPY (EGD) WITH  PROPOFOL;  Surgeon: Jeani Hawking, MD;  Location: [facility] ENDOSCOPY;  Service: Gastroenterology;  Laterality: N/A;  ? FRACTURE SURGERY    ? Laminectomy and foraminotomy at L5-S1    12/12/2019  ? TONSILLECTOMY    ? ?Social History: ?  reports that he has been smoking cigarettes. He has a 6.00 pack-year smoking history. He has never used smokeless tobacco. He reports that he does not currently use alcohol. He reports that he does not use drugs. ? ?Family History  ?Problem Relation Age of Onset  ? Arthritis Mother   ? Hypertension Sister   ? Hypertension Brother   ? Hypertension Sister   ? Hypertension Sister   ? Hypertension Sister   ? ? ?Medications: ?Patient's Medications  ?New Prescriptions  ? No medications on file  ?Previous Medications  ? ALBUTEROL (VENTOLIN HFA) 108 (90 BASE) MCG/ACT INHALER    INHALE 2 PUFFS INTO THE LUNGS EVERY 6 HOURS AS NEEDED FOR WHEEZING OR SHORTNESS OF BREATH  ? CIPROFLOXACIN (CILOXAN) 0.3 % OPHTHALMIC SOLUTION    Place 1 drop into both eyes every 2 (two) hours. Administer 1 drop, every 2 hours, while awake, for 2 days. Then 1 drop, every 4 hours, while awake, for the next 5 days.  ? FERROUS SULFATE 325 (65 FE) MG TABLET    Take 325 mg by mouth 2 (two) times daily with a meal.  ? FOLIC ACID (FOLVITE) 1 MG TABLET    Take 1 tablet (1 mg total) by mouth daily.  ? MAGNESIUM OXIDE (MAG-OX) 400 MG TABLET    Take 1 tablet (400 mg total) by mouth 2 (two) times daily.  ? METOPROLOL TARTRATE (LOPRESSOR) 25 MG TABLET    Take 0.5 tablets (12.5 mg total) by mouth 2 (two) times daily.  ? NYSTATIN POWDER    Apply 1 application. topically 3 (three) times daily.  ? OXYCODONE-ACETAMINOPHEN (PERCOCET/ROXICET) 5-325 MG TABLET    Take one tablet by mouth every 8-12 hours as needed for pain.  ? PANTOPRAZOLE (PROTONIX) 40 MG TABLET    Take 1 tablet (40 mg total) by mouth daily.  ? TETRAHYDROZOLINE HCL (VISINE OP)    Place 1 drop into both eyes daily as needed (For dry eyes).  ? VITAMIN D, ERGOCALCIFEROL, (DRISDOL)  1.25 MG (50000 UNIT) CAPS CAPSULE    Take 1 capsule (50,000 Units total) by mouth every 7 (seven) days.  ?Modified Medications  ? No medications on file  ?Discontinued Medications  ? NICOTINE (NICODERM CQ - DOSED IN MG/24 HOURS) 14 MG/24HR PATCH    Place 1 patch (14 mg total) onto the skin daily.  ? ? ?Physical Exam: ? ?Vitals:  ? 08/15/21 1410  ?BP: 130/78  ?Pulse: 76  ?Temp: (!) 97.3 ?F (36.3 ?C)  ?TempSrc: Temporal  ?SpO2: 98%  ? ?There is no height or weight on file to calculate BMI. ?Wt Readings from Last 3 Encounters:  ?08/04/21 169 lb 9.6 oz (76.9 kg)  ?06/29/21 189 lb 9.5 oz (86 kg)  ?01/03/21 190 lb (86.2 kg)  ? ? ?Physical Exam ?Constitutional:   ?   General: He is not in acute distress. ?   Appearance: He is well-developed. He is not diaphoretic.  ?HENT:  ?  Head: Normocephalic and atraumatic.  ?   Mouth/Throat:  ?   Pharynx: No oropharyngeal exudate.  ?Eyes:  ?   Conjunctiva/sclera: Conjunctivae normal.  ?   Pupils: Pupils are equal, round, and reactive to light.  ?Cardiovascular:  ?   Rate and Rhythm: Normal rate and regular rhythm.  ?   Heart sounds: Normal heart sounds.  ?Pulmonary:  ?   Effort: Pulmonary effort is normal.  ?   Breath sounds: Normal breath sounds.  ?Abdominal:  ?   General: Bowel sounds are normal.  ?   Palpations: Abdomen is soft.  ?Musculoskeletal:  ?   Cervical back: Normal range of motion and neck supple.  ?   Right lower leg: Edema (1+) present.  ?   Left lower leg: Edema (1+) present.  ?Skin: ?   General: Skin is warm and dry.  ?Neurological:  ?   Mental Status: He is alert and oriented to person, place, and time.  ?Psychiatric:     ?   Mood and Affect: Mood normal.  ? ? ?Labs reviewed: ?Basic Metabolic Panel: ?Recent Labs  ?  06/30/21 ?0005 06/30/21 ?[id number] 06/30/21 ?[id number] 08/01/21 ?0354 08/02/21 ?[id number] 08/03/21 ?[id number] 08/04/21 ?0234  ?NA 137 136   < > 130* 132* 131* 132*  ?K 2.8* 2.7*   < > 3.2* 4.1 4.2 3.6  ?CL 98 100   < > 91* 96* 96* 97*  ?CO2 28 28   < > 31 28 25 28   ?GLUCOSE  100* 104*   < > 101* 80 74 86  ?BUN <5* <5*   < > 9 7 8 7   ?CREATININE 0.60 0.51   < > 0.66 0.54 0.64 0.71  ?CALCIUM 7.2* 7.2*   < > 7.8* 8.2* 8.6* 8.5*  ?MG 1.3*  --    < > 1.5* 2.1 1.8  --   ?PHOS 2.4*

## 2021-08-16 LAB — CBC WITH DIFFERENTIAL/PLATELET
Absolute Monocytes: 331 cells/uL (ref 200–950)
Basophils Absolute: 152 cells/uL (ref 0–200)
Basophils Relative: 2.2 %
Eosinophils Absolute: 173 cells/uL (ref 15–500)
Eosinophils Relative: 2.5 %
HCT: 28.7 % — ABNORMAL LOW (ref 35.0–45.0)
Hemoglobin: 9.4 g/dL — ABNORMAL LOW (ref 11.7–15.5)
Lymphs Abs: 1380 cells/uL (ref 850–3900)
MCH: 32.1 pg (ref 27.0–33.0)
MCHC: 32.8 g/dL (ref 32.0–36.0)
MCV: 98 fL (ref 80.0–100.0)
MPV: 11 fL (ref 7.5–12.5)
Monocytes Relative: 4.8 %
Neutro Abs: 4865 cells/uL (ref 1500–7800)
Neutrophils Relative %: 70.5 %
Platelets: 342 10*3/uL (ref 140–400)
RBC: 2.93 10*6/uL — ABNORMAL LOW (ref 3.80–5.10)
RDW: 15.4 % — ABNORMAL HIGH (ref 11.0–15.0)
Total Lymphocyte: 20 %
WBC: 6.9 10*3/uL (ref 3.8–10.8)

## 2021-08-16 LAB — COMPLETE METABOLIC PANEL WITH GFR
AG Ratio: 0.9 (calc) — ABNORMAL LOW (ref 1.0–2.5)
ALT: 12 U/L (ref 6–29)
AST: 34 U/L (ref 10–35)
Albumin: 3.1 g/dL — ABNORMAL LOW (ref 3.6–5.1)
Alkaline phosphatase (APISO): 147 U/L (ref 37–153)
BUN/Creatinine Ratio: 17 (calc) (ref 6–22)
BUN: 7 mg/dL (ref 7–25)
CO2: 24 mmol/L (ref 20–32)
Calcium: 8.8 mg/dL (ref 8.6–10.4)
Chloride: 104 mmol/L (ref 98–110)
Creat: 0.42 mg/dL — ABNORMAL LOW (ref 0.50–1.03)
Globulin: 3.3 g/dL (calc) (ref 1.9–3.7)
Glucose, Bld: 60 mg/dL — ABNORMAL LOW (ref 65–99)
Potassium: 4.6 mmol/L (ref 3.5–5.3)
Sodium: 138 mmol/L (ref 135–146)
Total Bilirubin: 2 mg/dL — ABNORMAL HIGH (ref 0.2–1.2)
Total Protein: 6.4 g/dL (ref 6.1–8.1)
eGFR: 113 mL/min/{1.73_m2} (ref >=60)

## 2021-08-16 LAB — MAGNESIUM: Magnesium: 1.7 mg/dL (ref 1.5–2.5)

## 2021-08-19 ENCOUNTER — Ambulatory Visit: Payer: Medicare Other | Admitting: Physician Assistant

## 2021-08-19 DIAGNOSIS — E872 Acidosis, unspecified: Secondary | ICD-10-CM | POA: Diagnosis not present

## 2021-08-19 DIAGNOSIS — E860 Dehydration: Secondary | ICD-10-CM | POA: Diagnosis not present

## 2021-08-19 DIAGNOSIS — Z9181 History of falling: Secondary | ICD-10-CM | POA: Diagnosis not present

## 2021-08-19 DIAGNOSIS — M545 Low back pain, unspecified: Secondary | ICD-10-CM | POA: Diagnosis not present

## 2021-08-19 DIAGNOSIS — D649 Anemia, unspecified: Secondary | ICD-10-CM

## 2021-08-19 DIAGNOSIS — E538 Deficiency of other specified B group vitamins: Secondary | ICD-10-CM | POA: Diagnosis not present

## 2021-08-19 DIAGNOSIS — D696 Thrombocytopenia, unspecified: Secondary | ICD-10-CM | POA: Diagnosis not present

## 2021-08-19 DIAGNOSIS — I471 Supraventricular tachycardia: Secondary | ICD-10-CM

## 2021-08-19 DIAGNOSIS — K760 Fatty (change of) liver, not elsewhere classified: Secondary | ICD-10-CM | POA: Diagnosis not present

## 2021-08-19 DIAGNOSIS — G4700 Insomnia, unspecified: Secondary | ICD-10-CM | POA: Diagnosis not present

## 2021-08-19 DIAGNOSIS — I959 Hypotension, unspecified: Secondary | ICD-10-CM | POA: Diagnosis not present

## 2021-08-19 DIAGNOSIS — D539 Nutritional anemia, unspecified: Secondary | ICD-10-CM | POA: Diagnosis not present

## 2021-08-19 DIAGNOSIS — G8929 Other chronic pain: Secondary | ICD-10-CM | POA: Diagnosis not present

## 2021-08-19 DIAGNOSIS — I10 Essential (primary) hypertension: Secondary | ICD-10-CM | POA: Diagnosis not present

## 2021-08-19 DIAGNOSIS — K529 Noninfective gastroenteritis and colitis, unspecified: Secondary | ICD-10-CM | POA: Diagnosis not present

## 2021-08-19 DIAGNOSIS — Z87891 Personal history of nicotine dependence: Secondary | ICD-10-CM | POA: Diagnosis not present

## 2021-08-20 DIAGNOSIS — D696 Thrombocytopenia, unspecified: Secondary | ICD-10-CM | POA: Diagnosis not present

## 2021-08-20 DIAGNOSIS — K760 Fatty (change of) liver, not elsewhere classified: Secondary | ICD-10-CM | POA: Diagnosis not present

## 2021-08-20 DIAGNOSIS — Z87891 Personal history of nicotine dependence: Secondary | ICD-10-CM | POA: Diagnosis not present

## 2021-08-20 DIAGNOSIS — Z9181 History of falling: Secondary | ICD-10-CM | POA: Diagnosis not present

## 2021-08-20 DIAGNOSIS — M545 Low back pain, unspecified: Secondary | ICD-10-CM | POA: Diagnosis not present

## 2021-08-20 DIAGNOSIS — K529 Noninfective gastroenteritis and colitis, unspecified: Secondary | ICD-10-CM | POA: Diagnosis not present

## 2021-08-20 DIAGNOSIS — E538 Deficiency of other specified B group vitamins: Secondary | ICD-10-CM | POA: Diagnosis not present

## 2021-08-20 DIAGNOSIS — G8929 Other chronic pain: Secondary | ICD-10-CM | POA: Diagnosis not present

## 2021-08-20 DIAGNOSIS — I959 Hypotension, unspecified: Secondary | ICD-10-CM | POA: Diagnosis not present

## 2021-08-20 DIAGNOSIS — E860 Dehydration: Secondary | ICD-10-CM | POA: Diagnosis not present

## 2021-08-20 DIAGNOSIS — I10 Essential (primary) hypertension: Secondary | ICD-10-CM | POA: Diagnosis not present

## 2021-08-20 DIAGNOSIS — E872 Acidosis, unspecified: Secondary | ICD-10-CM | POA: Diagnosis not present

## 2021-08-20 DIAGNOSIS — G4700 Insomnia, unspecified: Secondary | ICD-10-CM | POA: Diagnosis not present

## 2021-08-20 DIAGNOSIS — D539 Nutritional anemia, unspecified: Secondary | ICD-10-CM | POA: Diagnosis not present

## 2021-08-22 ENCOUNTER — Other Ambulatory Visit: Payer: Self-pay | Admitting: *Deleted

## 2021-08-22 DIAGNOSIS — G8929 Other chronic pain: Secondary | ICD-10-CM

## 2021-08-22 NOTE — Telephone Encounter (Signed)
Patient requested refill.  ?Epic LR: 07/24/2021 ?Contract Date: 01/03/2021 ?Pended Rx and sent to [facility] for approval.  ? ? ?Patient stated that he didn't get anything from the Dr. Claiborne Billings did his mouth surgery because he gets the pain medication from you.  ?

## 2021-08-22 NOTE — Telephone Encounter (Signed)
Not due for refill since he was hospitalized for 7 days and last fill was 4/6  ?Will refill Monday  ?

## 2021-08-25 MED ORDER — OXYCODONE-ACETAMINOPHEN 5-325 MG PO TABS: ORAL_TABLET | ORAL | 0 refills | Status: DC

## 2021-08-25 NOTE — Addendum Note (Signed)
Addended by: Nelda Severe A on: 08/25/2021 02:37 PM ? ? Modules accepted: Orders ? ?

## 2021-08-25 NOTE — Telephone Encounter (Signed)
Pended Rx for approval.

## 2021-08-26 DIAGNOSIS — K760 Fatty (change of) liver, not elsewhere classified: Secondary | ICD-10-CM | POA: Diagnosis not present

## 2021-08-26 DIAGNOSIS — G4700 Insomnia, unspecified: Secondary | ICD-10-CM | POA: Diagnosis not present

## 2021-08-26 DIAGNOSIS — D539 Nutritional anemia, unspecified: Secondary | ICD-10-CM | POA: Diagnosis not present

## 2021-08-26 DIAGNOSIS — I10 Essential (primary) hypertension: Secondary | ICD-10-CM | POA: Diagnosis not present

## 2021-08-26 DIAGNOSIS — I959 Hypotension, unspecified: Secondary | ICD-10-CM | POA: Diagnosis not present

## 2021-08-26 DIAGNOSIS — E860 Dehydration: Secondary | ICD-10-CM | POA: Diagnosis not present

## 2021-08-26 DIAGNOSIS — Z9181 History of falling: Secondary | ICD-10-CM | POA: Diagnosis not present

## 2021-08-26 DIAGNOSIS — E538 Deficiency of other specified B group vitamins: Secondary | ICD-10-CM | POA: Diagnosis not present

## 2021-08-26 DIAGNOSIS — M545 Low back pain, unspecified: Secondary | ICD-10-CM | POA: Diagnosis not present

## 2021-08-26 DIAGNOSIS — E872 Acidosis, unspecified: Secondary | ICD-10-CM | POA: Diagnosis not present

## 2021-08-26 DIAGNOSIS — G8929 Other chronic pain: Secondary | ICD-10-CM | POA: Diagnosis not present

## 2021-08-26 DIAGNOSIS — D696 Thrombocytopenia, unspecified: Secondary | ICD-10-CM | POA: Diagnosis not present

## 2021-08-26 DIAGNOSIS — Z87891 Personal history of nicotine dependence: Secondary | ICD-10-CM | POA: Diagnosis not present

## 2021-08-26 DIAGNOSIS — K529 Noninfective gastroenteritis and colitis, unspecified: Secondary | ICD-10-CM | POA: Diagnosis not present

## 2021-08-27 DIAGNOSIS — E538 Deficiency of other specified B group vitamins: Secondary | ICD-10-CM | POA: Diagnosis not present

## 2021-08-27 DIAGNOSIS — I10 Essential (primary) hypertension: Secondary | ICD-10-CM | POA: Diagnosis not present

## 2021-08-27 DIAGNOSIS — D696 Thrombocytopenia, unspecified: Secondary | ICD-10-CM | POA: Diagnosis not present

## 2021-08-27 DIAGNOSIS — Z9181 History of falling: Secondary | ICD-10-CM | POA: Diagnosis not present

## 2021-08-27 DIAGNOSIS — I959 Hypotension, unspecified: Secondary | ICD-10-CM | POA: Diagnosis not present

## 2021-08-27 DIAGNOSIS — E860 Dehydration: Secondary | ICD-10-CM | POA: Diagnosis not present

## 2021-08-27 DIAGNOSIS — K529 Noninfective gastroenteritis and colitis, unspecified: Secondary | ICD-10-CM | POA: Diagnosis not present

## 2021-08-27 DIAGNOSIS — G4700 Insomnia, unspecified: Secondary | ICD-10-CM | POA: Diagnosis not present

## 2021-08-27 DIAGNOSIS — G8929 Other chronic pain: Secondary | ICD-10-CM | POA: Diagnosis not present

## 2021-08-27 DIAGNOSIS — D539 Nutritional anemia, unspecified: Secondary | ICD-10-CM | POA: Diagnosis not present

## 2021-08-27 DIAGNOSIS — M545 Low back pain, unspecified: Secondary | ICD-10-CM | POA: Diagnosis not present

## 2021-08-27 DIAGNOSIS — Z87891 Personal history of nicotine dependence: Secondary | ICD-10-CM | POA: Diagnosis not present

## 2021-08-27 DIAGNOSIS — E872 Acidosis, unspecified: Secondary | ICD-10-CM | POA: Diagnosis not present

## 2021-08-27 DIAGNOSIS — K760 Fatty (change of) liver, not elsewhere classified: Secondary | ICD-10-CM | POA: Diagnosis not present

## 2021-08-28 ENCOUNTER — Ambulatory Visit: Payer: Medicare Other | Admitting: Gastroenterology

## 2021-08-28 ENCOUNTER — Telehealth (INDEPENDENT_AMBULATORY_CARE_PROVIDER_SITE_OTHER): Payer: Medicare Other | Admitting: Nurse Practitioner

## 2021-08-28 DIAGNOSIS — I10 Essential (primary) hypertension: Secondary | ICD-10-CM | POA: Diagnosis not present

## 2021-08-28 DIAGNOSIS — R600 Localized edema: Secondary | ICD-10-CM

## 2021-08-28 MED ORDER — POTASSIUM CHLORIDE CRYS ER 10 MEQ PO TBCR: 10.0000 meq | EXTENDED_RELEASE_TABLET | Freq: Every day | ORAL | 0 refills | Status: DC

## 2021-08-28 MED ORDER — FUROSEMIDE 20 MG PO TABS
20.0000 mg | ORAL_TABLET | Freq: Every day | ORAL | 3 refills | Status: DC
Start: 2021-08-28 — End: 2021-09-03

## 2021-08-28 NOTE — Progress Notes (Signed)
This service is provided via telemedicine ? ?No vital signs collected/recorded due to the encounter was a telemedicine visit.  ? ?Location of patient (ex: home, work):  Home ? ?Patient consents to a telephone visit:  Yes, see encounter dated 10/17/2020 ? ?Location of the provider (ex: office, home):  [employer] ? ?Name of any referring provider:  N/A ? ?Names of all persons participating in the telemedicine service and their role in the encounter:  Abbey Chatters, Nurse Practitioner, Elveria Royals, CMA, and patient.  ? ?Time spent on call:  7 minutes with medical assistant ? ?

## 2021-08-28 NOTE — Progress Notes (Signed)
? ? ?Careteam: ?Patient Care Team: ?Lauree Chandler, NP as PCP - General (Geriatric Medicine) ?Fay Records, MD as PCP - Cardiology (Cardiology) ? ?Advanced Directive information ?  ? ?Allergies  ?Allergen Reactions  ? Garlic Anaphylaxis  ? Aspirin Hives  ? Lyrica [Pregabalin]   ?  Nightmares, did not help neuropathy  ? Penicillins Hives  ? ? ?Chief Complaint  ?Patient presents with  ? Acute Visit  ?  Patient would like PT order. Patient states he needs PT for exercise. His fet are swollen. Patient states they hurt. He uses walker to get around.  ? ? ? ?HPI: Patient is a 61 y.o. adult requesting PT order.  ?He was undergoing PT and they signed off 2 days ago.  ?Reports exercises were helping the leg swelling going on.  ?He can not do the exercises the same.  ?He is still unable to drive. Pain in feet and using walker to get around.  ?He has been increasing protein in diet.  ?Blood pressure has been in the 140s ?No shortness of breath, chest pains.  ? ?Review of Systems:  ?Review of Systems  ?Constitutional:  Negative for chills and fever.  ?Respiratory:  Negative for shortness of breath and wheezing.   ?Cardiovascular:  Positive for leg swelling. Negative for chest pain.  ?Musculoskeletal:  Positive for joint pain and myalgias.  ?Neurological:  Positive for tingling (in feet).  ? ?Past Medical History:  ?Diagnosis Date  ? Acute bronchitis   ? Cervical high risk HPV (human papillomavirus) test positive 12/2017  ? Negative subtype 16, 18/45  ? Chronic back pain   ? Complication of anesthesia   ? "didn't wake up"  ? Cough   ? Insomnia, unspecified   ? Other malaise and fatigue   ? Other specified diseases of blood and blood-forming organs(289.89)   ? Tobacco use disorder   ? ?Past Surgical History:  ?Procedure Laterality Date  ? BIOPSY  07/01/2021  ? Procedure: BIOPSY;  Surgeon: Carol Ada, MD;  Location: [place];  Service: Gastroenterology;;  EGD and COLON  ? COLONOSCOPY WITH PROPOFOL N/A 07/01/2021  ?  Procedure: COLONOSCOPY WITH PROPOFOL;  Surgeon: Carol Ada, MD;  Location: [place];  Service: Gastroenterology;  Laterality: N/A;  ? ESOPHAGOGASTRODUODENOSCOPY N/A 03/17/2013  ? Procedure: ESOPHAGOGASTRODUODENOSCOPY (EGD);  Surgeon: Ladene Artist, MD;  Location[facility] ENDOSCOPY;  Service: Endoscopy;  Laterality: N/A;  ? ESOPHAGOGASTRODUODENOSCOPY (EGD) WITH PROPOFOL N/A 07/01/2021  ? Procedure: ESOPHAGOGASTRODUODENOSCOPY (EGD) WITH PROPOFOL;  Surgeon: Carol Ada, MD;  Location: [place];  Service: Gastroenterology;  Laterality: N/A;  ? FRACTURE SURGERY    ? Laminectomy and foraminotomy at L5-S1    12/12/2019  ? TONSILLECTOMY    ? ?Social History: ?  reports that he has been smoking cigarettes. He has a 6.00 pack-year smoking history. He has never used smokeless tobacco. He reports that he does not currently use alcohol. He reports that he does not use drugs. ? ?Family History  ?Problem Relation Age of Onset  ? Arthritis Mother   ? Hypertension Sister   ? Hypertension Brother   ? Hypertension Sister   ? Hypertension Sister   ? Hypertension Sister   ? ? ?Medications: ?Patient's Medications  ?New Prescriptions  ? FUROSEMIDE (LASIX) 20 MG TABLET    Take 1 tablet (20 mg total) by mouth daily.  ? POTASSIUM CHLORIDE SA (KLOR-CON M) 10 MEQ TABLET    Take 1 tablet (10 mEq total) by mouth daily.  ?Previous Medications  ?  ALBUTEROL (VENTOLIN HFA) 108 (90 BASE) MCG/ACT INHALER    INHALE 2 PUFFS INTO THE LUNGS EVERY 6 HOURS AS NEEDED FOR WHEEZING OR SHORTNESS OF BREATH  ? CIPROFLOXACIN (CILOXAN) 0.3 % OPHTHALMIC SOLUTION    Place 1 drop into both eyes every 2 (two) hours. Administer 1 drop, every 2 hours, while awake, for 2 days. Then 1 drop, every 4 hours, while awake, for the next 5 days.  ? FERROUS SULFATE 325 (65 FE) MG TABLET    Take 325 mg by mouth 2 (two) times daily with a meal.  ? FOLIC ACID (FOLVITE) 1 MG TABLET    Take 1 tablet (1 mg total) by mouth daily.  ? MAGNESIUM OXIDE (MAG-OX) 400 MG TABLET    Take  1 tablet (400 mg total) by mouth 2 (two) times daily.  ? METOPROLOL TARTRATE (LOPRESSOR) 25 MG TABLET    Take 0.5 tablets (12.5 mg total) by mouth 2 (two) times daily.  ? NYSTATIN POWDER    Apply 1 application. topically 3 (three) times daily.  ? OXYCODONE-ACETAMINOPHEN (PERCOCET/ROXICET) 5-325 MG TABLET    Take one tablet by mouth every 8-12 hours as needed for pain.  ? PANTOPRAZOLE (PROTONIX) 40 MG TABLET    Take 1 tablet (40 mg total) by mouth daily.  ? TETRAHYDROZOLINE HCL (VISINE OP)    Place 1 drop into both eyes daily as needed (For dry eyes).  ? VITAMIN D, ERGOCALCIFEROL, (DRISDOL) 1.25 MG (50000 UNIT) CAPS CAPSULE    Take 1 capsule (50,000 Units total) by mouth every 7 (seven) days.  ?Modified Medications  ? No medications on file  ?Discontinued Medications  ? No medications on file  ? ? ?Physical Exam: ? ?There were no vitals filed for this visit. ?There is no height or weight on file to calculate BMI. ?Wt Readings from Last 3 Encounters:  ?08/04/21 169 lb 9.6 oz (76.9 kg)  ?06/29/21 189 lb 9.5 oz (86 kg)  ?01/03/21 190 lb (86.2 kg)  ? ? ?Physical Exam ?Constitutional:   ?   Appearance: Normal appearance.  ?Neurological:  ?   Mental Status: He is alert. Mental status is at baseline.  ?Psychiatric:     ?   Mood and Affect: Mood normal.  ? ? ?Labs reviewed: ?Basic Metabolic Panel: ?Recent Labs  ?  06/30/21 ?0005 06/30/21 ?[id number] 06/30/21 ?[id number] 08/02/21 ?0352 08/03/21 ?0416 08/04/21 ?0234 08/15/21 ?1437  ?NA 137 136   < > 132* 131* 132* 138  ?K 2.8* 2.7*   < > 4.1 4.2 3.6 4.6  ?CL 98 100   < > 96* 96* 97* 104  ?CO2 28 28   < > 28 25 28 24   ?GLUCOSE 100* 104*   < > 80 74 86 60*  ?BUN <5* <5*   < > 7 8 7 7   ?CREATININE 0.60 0.51   < > 0.54 0.64 0.71 0.42*  ?CALCIUM 7.2* 7.2*   < > 8.2* 8.6* 8.5* 8.8  ?MG 1.3*  --    < > 2.1 1.8  --  1.7  ?PHOS 2.4*  --   --   --  2.7  --   --   ?TSH  --  2.961  --   --   --   --   --   ? < > = values in this interval not displayed.  ? ?Liver Function Tests: ?Recent Labs  ?   08/02/21 ?0352 08/03/21 ?0416 08/04/21 ?0234 08/15/21 ?1437  ?AST 42* 45* 34 34  ?ALT  19 20 16 12   ?ALKPHOS 161* 190* 157*  --   ?BILITOT 5.9* 6.6* 4.2* 2.0*  ?PROT 5.0* 6.2* 5.2* 6.4  ?ALBUMIN 2.4* 2.7*  2.6* 2.2*  --   ? ?No results for input(s): LIPASE, AMYLASE in the last 8760 hours. ?No results for input(s): AMMONIA in the last 8760 hours. ?CBC: ?Recent Labs  ?  08/02/21 ?0352 08/03/21 ?0416 08/04/21 ?0234 08/15/21 ?1437  ?WBC 6.5 8.2 7.1 6.9  ?NEUTROABS 5.3 7.0  --  4,865  ?HGB 8.2* 9.4* 7.9* 9.4*  ?HCT 23.7* 27.7* 23.8* 28.7*  ?MCV 94.8 97.2 97.5 98.0  ?PLT 80* 95* 94* 342  ? ?Lipid Panel: ?Recent Labs  ?  01/03/21 ?1151  ?CHOL 140  ?HDL 80  ?[place] 45  ?TRIG 70  ?CHOLHDL 1.8  ? ?TSH: ?Recent Labs  ?  06/30/21 ?[id number]  ?TSH 2.961  ? ?A1C: ?Lab Results  ?Component Value Date  ? HGBA1C 4.8 07/01/2015  ? ? ? ?Assessment/Plan ?1. Lower extremity edema ?-ongoing, PT was helping but has not resolved, encouraged to do exercises but limited due to equipment.  ?- Ambulatory referral to [place] ?-compression hose recommended. ?-continue to increase protein in diet.  ?-elevated Legs as tolerates  ?-lasix 40 mg daily for 2 days then decrease to 20 mg daily ?-potassium 20 meq daily for 2 days then decrease to 10 meq daily by mouth  ?- furosemide (LASIX) 20 MG tablet; Take 1 tablet (20 mg total) by mouth daily.  Dispense: 30 tablet; Refill: 3 ?- potassium chloride SA (KLOR-CON M) 10 MEQ tablet; Take 1 tablet (10 mEq total) by mouth daily.  Dispense: 30 tablet; Refill: 0 ? ?2. Essential hypertension ?Stable, will continue to monitor. Lasix likely will benefit blood pressure as well.  ? ? ? ? ? ?Next appt: 09/03/2021  ?Carlos American. Dewaine Oats, AGNP ? ?[facility] ?[phone]  ? ? ?Virtual Visit via mychart video ? ?I connected with patient on 08/28/21 at  1:20 PM EDT by video and verified that I am speaking with the correct person using two identifiers. ? ?Location: ?Patient: home ?Provider: [facility]  ?  ?I discussed the limitations, risks, security and privacy concerns of performing an evaluation and management service by telephone and the availability of in person appointments. I also discussed with

## 2021-08-28 NOTE — Patient Instructions (Signed)
Take lasix 40 mg daily x 2 days with potassium 20 meq x2 days after that take lasix 20 mg daily with potassium 10 meq daily  ? ?

## 2021-08-29 DIAGNOSIS — Z79891 Long term (current) use of opiate analgesic: Secondary | ICD-10-CM | POA: Diagnosis not present

## 2021-08-29 DIAGNOSIS — G629 Polyneuropathy, unspecified: Secondary | ICD-10-CM | POA: Diagnosis not present

## 2021-08-29 DIAGNOSIS — K760 Fatty (change of) liver, not elsewhere classified: Secondary | ICD-10-CM | POA: Diagnosis not present

## 2021-08-29 DIAGNOSIS — D519 Vitamin B12 deficiency anemia, unspecified: Secondary | ICD-10-CM | POA: Diagnosis not present

## 2021-08-29 DIAGNOSIS — Z9181 History of falling: Secondary | ICD-10-CM | POA: Diagnosis not present

## 2021-08-29 DIAGNOSIS — I10 Essential (primary) hypertension: Secondary | ICD-10-CM | POA: Diagnosis not present

## 2021-08-29 DIAGNOSIS — R600 Localized edema: Secondary | ICD-10-CM | POA: Diagnosis not present

## 2021-08-29 DIAGNOSIS — F17210 Nicotine dependence, cigarettes, uncomplicated: Secondary | ICD-10-CM | POA: Diagnosis not present

## 2021-08-29 DIAGNOSIS — E876 Hypokalemia: Secondary | ICD-10-CM | POA: Diagnosis not present

## 2021-08-29 DIAGNOSIS — M549 Dorsalgia, unspecified: Secondary | ICD-10-CM | POA: Diagnosis not present

## 2021-08-29 DIAGNOSIS — E440 Moderate protein-calorie malnutrition: Secondary | ICD-10-CM | POA: Diagnosis not present

## 2021-08-29 DIAGNOSIS — E559 Vitamin D deficiency, unspecified: Secondary | ICD-10-CM | POA: Diagnosis not present

## 2021-08-29 DIAGNOSIS — G4700 Insomnia, unspecified: Secondary | ICD-10-CM | POA: Diagnosis not present

## 2021-08-29 DIAGNOSIS — G8929 Other chronic pain: Secondary | ICD-10-CM | POA: Diagnosis not present

## 2021-09-03 ENCOUNTER — Ambulatory Visit (INDEPENDENT_AMBULATORY_CARE_PROVIDER_SITE_OTHER): Payer: Medicare Other | Admitting: Nurse Practitioner

## 2021-09-03 ENCOUNTER — Encounter: Payer: Self-pay | Admitting: Nurse Practitioner

## 2021-09-03 ENCOUNTER — Ambulatory Visit: Payer: Medicare Other | Admitting: Nurse Practitioner

## 2021-09-03 VITALS — BP 112/78 | HR 109 | Temp 97.5°F

## 2021-09-03 DIAGNOSIS — I471 Supraventricular tachycardia: Secondary | ICD-10-CM

## 2021-09-03 DIAGNOSIS — E559 Vitamin D deficiency, unspecified: Secondary | ICD-10-CM | POA: Diagnosis not present

## 2021-09-03 DIAGNOSIS — D519 Vitamin B12 deficiency anemia, unspecified: Secondary | ICD-10-CM

## 2021-09-03 DIAGNOSIS — M5441 Lumbago with sciatica, right side: Secondary | ICD-10-CM

## 2021-09-03 DIAGNOSIS — R600 Localized edema: Secondary | ICD-10-CM | POA: Diagnosis not present

## 2021-09-03 DIAGNOSIS — R531 Weakness: Secondary | ICD-10-CM | POA: Diagnosis not present

## 2021-09-03 DIAGNOSIS — I10 Essential (primary) hypertension: Secondary | ICD-10-CM | POA: Diagnosis not present

## 2021-09-03 DIAGNOSIS — B372 Candidiasis of skin and nail: Secondary | ICD-10-CM | POA: Diagnosis not present

## 2021-09-03 DIAGNOSIS — G8929 Other chronic pain: Secondary | ICD-10-CM | POA: Diagnosis not present

## 2021-09-03 DIAGNOSIS — E440 Moderate protein-calorie malnutrition: Secondary | ICD-10-CM | POA: Diagnosis not present

## 2021-09-03 DIAGNOSIS — Z720 Tobacco use: Secondary | ICD-10-CM

## 2021-09-03 MED ORDER — METOPROLOL TARTRATE 25 MG PO TABS: 12.5000 mg | ORAL_TABLET | Freq: Two times a day (BID) | ORAL | 1 refills | Status: DC

## 2021-09-03 MED ORDER — VITAMIN D (ERGOCALCIFEROL) 1.25 MG (50000 UNIT) PO CAPS: 50000.0000 [IU] | ORAL_CAPSULE | ORAL | 0 refills | Status: DC

## 2021-09-03 NOTE — Progress Notes (Signed)
? ? ?Careteam: ?Patient Care Team: ?Sharon Seller, NP as PCP - General (Geriatric Medicine) ?Pricilla Riffle, MD as PCP - Cardiology (Cardiology) ? ?PLACE OF SERVICE:  ?[facility] CLINIC  ?Advanced Directive information ?Does Patient Have a Medical Advance Directive?: No, Would patient like information on creating a medical advance directive?: No - Patient declined ? ?Allergies  ?Allergen Reactions  ? Garlic Anaphylaxis  ? Aspirin Hives  ? Lyrica [Pregabalin]   ?  Nightmares, did not help neuropathy  ? Penicillins Hives  ? ? ?Chief Complaint  ?Patient presents with  ? Medical Management of Chronic Issues  ?  6 month follow-up and follow-up on leg swelling. Discuss need for shingrix, additional covid boosters, and pap smear or post pone if patient refuses. NCIR verified. Discuss need for prescribed vit D. Moderate fall risk   ? ? ? ?HPI: Patient is a 61 y.o. adult for routine follow up.  ?Eating but has mouth pain due to needing dental work.  ? ?Started lasix 20 mg daily last week- swelling is a lot better and came down fast.  ?Legs are still sore but swelling better.  ? ?Did not take lopressor today. Stopped end of last week when ran out.  ? ?Ran out of vit d and did not get refill.  ? ?Anemia- continue on iron and b12 supplement.  ? ? ?Review of Systems:  ?Review of Systems  ?Constitutional:  Negative for chills, fever and weight loss.  ?HENT:  Negative for congestion, sinus pain, sore throat and tinnitus.   ?Respiratory:  Positive for cough. Negative for sputum production and shortness of breath.   ?     Chest congestion   ?Cardiovascular:  Negative for chest pain, palpitations and leg swelling.  ?Gastrointestinal:  Negative for abdominal pain, constipation, diarrhea and heartburn.  ?Genitourinary:  Negative for dysuria, frequency and urgency.  ?Musculoskeletal:  Negative for back pain, falls, joint pain and myalgias.  ?Skin: Negative.   ?Neurological:  Negative for dizziness and headaches.  ?Psychiatric/Behavioral:   Negative for depression and memory loss. The patient does not have insomnia.   ? ?Past Medical History:  ?Diagnosis Date  ? Acute bronchitis   ? Cervical high risk HPV (human papillomavirus) test positive 12/2017  ? Negative subtype 16, 18/45  ? Chronic back pain   ? Complication of anesthesia   ? "didn't wake up"  ? Cough   ? Insomnia, unspecified   ? Other malaise and fatigue   ? Other specified diseases of blood and blood-forming organs(289.89)   ? Tobacco use disorder   ? ?Past Surgical History:  ?Procedure Laterality Date  ? BIOPSY  07/01/2021  ? Procedure: BIOPSY;  Surgeon: Jeani Hawking, MD;  Location: [facility] ENDOSCOPY;  Service: Gastroenterology;;  EGD and COLON  ? COLONOSCOPY WITH PROPOFOL N/A 07/01/2021  ? Procedure: COLONOSCOPY WITH PROPOFOL;  Surgeon: Jeani Hawking, MD;  Location: [facility] ENDOSCOPY;  Service: Gastroenterology;  Laterality: N/A;  ? ESOPHAGOGASTRODUODENOSCOPY N/A 03/17/2013  ? Procedure: ESOPHAGOGASTRODUODENOSCOPY (EGD);  Surgeon: Meryl Dare, MD;  Location: [facility] ENDOSCOPY;  Service: Endoscopy;  Laterality: N/A;  ? ESOPHAGOGASTRODUODENOSCOPY (EGD) WITH PROPOFOL N/A 07/01/2021  ? Procedure: ESOPHAGOGASTRODUODENOSCOPY (EGD) WITH PROPOFOL;  Surgeon: Jeani Hawking, MD;  Location: [facility] ENDOSCOPY;  Service: Gastroenterology;  Laterality: N/A;  ? FRACTURE SURGERY    ? Laminectomy and foraminotomy at L5-S1    12/12/2019  ? TONSILLECTOMY    ? ?Social History: ?  reports that he has been smoking cigarettes. He has a 6.00 pack-year smoking history.  He has never used smokeless tobacco. He reports that he does not currently use alcohol. He reports that he does not use drugs. ? ?Family History  ?Problem Relation Age of Onset  ? Arthritis Mother   ? Hypertension Sister   ? Hypertension Brother   ? Hypertension Sister   ? Hypertension Sister   ? Hypertension Sister   ? ? ?Medications: ?Patient's Medications  ?New Prescriptions  ? No medications on file  ?Previous Medications  ? ALBUTEROL (VENTOLIN HFA) 108 (90 BASE)  MCG/ACT INHALER    INHALE 2 PUFFS INTO THE LUNGS EVERY 6 HOURS AS NEEDED FOR WHEEZING OR SHORTNESS OF BREATH  ? CIPROFLOXACIN (CILOXAN) 0.3 % OPHTHALMIC SOLUTION    Place 1 drop into both eyes every 2 (two) hours. Administer 1 drop, every 2 hours, while awake, for 2 days. Then 1 drop, every 4 hours, while awake, for the next 5 days.  ? FERROUS SULFATE 325 (65 FE) MG TABLET    Take 325 mg by mouth 2 (two) times daily with a meal.  ? FOLIC ACID (FOLVITE) 1 MG TABLET    Take 1 tablet (1 mg total) by mouth daily.  ? FUROSEMIDE (LASIX) 20 MG TABLET    Take 1 tablet (20 mg total) by mouth daily.  ? MAGNESIUM OXIDE (MAG-OX) 400 MG TABLET    Take 1 tablet (400 mg total) by mouth 2 (two) times daily.  ? METOPROLOL TARTRATE (LOPRESSOR) 25 MG TABLET    Take 0.5 tablets (12.5 mg total) by mouth 2 (two) times daily.  ? NYSTATIN POWDER    Apply 1 application. topically 3 (three) times daily.  ? OXYCODONE-ACETAMINOPHEN (PERCOCET/ROXICET) 5-325 MG TABLET    Take one tablet by mouth every 8-12 hours as needed for pain.  ? PANTOPRAZOLE (PROTONIX) 40 MG TABLET    Take 1 tablet (40 mg total) by mouth daily.  ? POTASSIUM CHLORIDE SA (KLOR-CON M) 10 MEQ TABLET    Take 1 tablet (10 mEq total) by mouth daily.  ? TETRAHYDROZOLINE HCL (VISINE OP)    Place 1 drop into both eyes daily as needed (For dry eyes).  ? VITAMIN D, ERGOCALCIFEROL, (DRISDOL) 1.25 MG (50000 UNIT) CAPS CAPSULE    Take 1 capsule (50,000 Units total) by mouth every 7 (seven) days.  ?Modified Medications  ? No medications on file  ?Discontinued Medications  ? No medications on file  ? ? ?Physical Exam: ? ?Vitals:  ? 09/03/21 1015  ?BP: 112/78  ?Pulse: (!) 109  ?Temp: (!) 97.5 ?F (36.4 ?C)  ?TempSrc: Temporal  ?SpO2: 91%  ? ?There is no height or weight on file to calculate BMI. ?Wt Readings from Last 3 Encounters:  ?08/04/21 169 lb 9.6 oz (76.9 kg)  ?06/29/21 189 lb 9.5 oz (86 kg)  ?01/03/21 190 lb (86.2 kg)  ? ? ?Physical Exam ?Constitutional:   ?   General: He is not in  acute distress. ?   Appearance: He is well-developed. He is not diaphoretic.  ?HENT:  ?   Head: Normocephalic and atraumatic.  ?   Mouth/Throat:  ?   Pharynx: No oropharyngeal exudate.  ?Eyes:  ?   Conjunctiva/sclera: Conjunctivae normal.  ?   Pupils: Pupils are equal, round, and reactive to light.  ?Cardiovascular:  ?   Rate and Rhythm: Normal rate and regular rhythm.  ?   Heart sounds: Normal heart sounds.  ?Pulmonary:  ?   Effort: Pulmonary effort is normal.  ?   Breath sounds: Normal breath sounds.  ?  Abdominal:  ?   General: Bowel sounds are normal.  ?   Palpations: Abdomen is soft.  ?Musculoskeletal:  ?   Cervical back: Normal range of motion and neck supple.  ?   Right lower leg: No edema.  ?   Left lower leg: No edema.  ?Skin: ?   General: Skin is warm and dry.  ?Neurological:  ?   Mental Status: He is alert and oriented to person, place, and time. Mental status is at baseline.  ?Psychiatric:     ?   Mood and Affect: Mood normal.  ? ? ?Labs reviewed: ?Basic Metabolic Panel: ?Recent Labs  ?  06/30/21 ?0005 06/30/21 ?[id number] 06/30/21 ?[id number] 08/02/21 ?0352 08/03/21 ?0416 08/04/21 ?0234 08/15/21 ?1437  ?NA 137 136   < > 132* 131* 132* 138  ?K 2.8* 2.7*   < > 4.1 4.2 3.6 4.6  ?CL 98 100   < > 96* 96* 97* 104  ?CO2 28 28   < > 28 25 28 24   ?GLUCOSE 100* 104*   < > 80 74 86 60*  ?BUN <5* <5*   < > 7 8 7 7   ?CREATININE 0.60 0.51   < > 0.54 0.64 0.71 0.42*  ?CALCIUM 7.2* 7.2*   < > 8.2* 8.6* 8.5* 8.8  ?MG 1.3*  --    < > 2.1 1.8  --  1.7  ?PHOS 2.4*  --   --   --  2.7  --   --   ?TSH  --  2.961  --   --   --   --   --   ? < > = values in this interval not displayed.  ? ?Liver Function Tests: ?Recent Labs  ?  08/02/21 ?0352 08/03/21 ?0416 08/04/21 ?0234 08/15/21 ?1437  ?AST 42* 45* 34 34  ?ALT 19 20 16 12   ?ALKPHOS 161* 190* 157*  --   ?BILITOT 5.9* 6.6* 4.2* 2.0*  ?PROT 5.0* 6.2* 5.2* 6.4  ?ALBUMIN 2.4* 2.7*  2.6* 2.2*  --   ? ?No results for input(s): LIPASE, AMYLASE in the last 8760 hours. ?No results for input(s):  AMMONIA in the last 8760 hours. ?CBC: ?Recent Labs  ?  08/02/21 ?0352 08/03/21 ?0416 08/04/21 ?0234 08/15/21 ?1437  ?WBC 6.5 8.2 7.1 6.9  ?NEUTROABS 5.3 7.0  --  4,865  ?HGB 8.2* 9.4* 7.9* 9.4*  ?HCT 23.7

## 2021-09-03 NOTE — Patient Instructions (Addendum)
Stop lasix and potassium.  ?Call for worsening swelling.  ? ?Can increase vit c to twice daily  ?Mucinex DM by mouth twice daily with full glass of water ? ?

## 2021-09-04 ENCOUNTER — Telehealth: Payer: Self-pay

## 2021-09-04 DIAGNOSIS — M549 Dorsalgia, unspecified: Secondary | ICD-10-CM | POA: Diagnosis not present

## 2021-09-04 DIAGNOSIS — E440 Moderate protein-calorie malnutrition: Secondary | ICD-10-CM | POA: Diagnosis not present

## 2021-09-04 DIAGNOSIS — E876 Hypokalemia: Secondary | ICD-10-CM | POA: Diagnosis not present

## 2021-09-04 DIAGNOSIS — K760 Fatty (change of) liver, not elsewhere classified: Secondary | ICD-10-CM | POA: Diagnosis not present

## 2021-09-04 DIAGNOSIS — G4700 Insomnia, unspecified: Secondary | ICD-10-CM | POA: Diagnosis not present

## 2021-09-04 DIAGNOSIS — Z79891 Long term (current) use of opiate analgesic: Secondary | ICD-10-CM | POA: Diagnosis not present

## 2021-09-04 DIAGNOSIS — G629 Polyneuropathy, unspecified: Secondary | ICD-10-CM | POA: Diagnosis not present

## 2021-09-04 DIAGNOSIS — D519 Vitamin B12 deficiency anemia, unspecified: Secondary | ICD-10-CM | POA: Diagnosis not present

## 2021-09-04 DIAGNOSIS — G8929 Other chronic pain: Secondary | ICD-10-CM | POA: Diagnosis not present

## 2021-09-04 DIAGNOSIS — E559 Vitamin D deficiency, unspecified: Secondary | ICD-10-CM | POA: Diagnosis not present

## 2021-09-04 DIAGNOSIS — Z9181 History of falling: Secondary | ICD-10-CM | POA: Diagnosis not present

## 2021-09-04 DIAGNOSIS — I10 Essential (primary) hypertension: Secondary | ICD-10-CM | POA: Diagnosis not present

## 2021-09-04 DIAGNOSIS — F17210 Nicotine dependence, cigarettes, uncomplicated: Secondary | ICD-10-CM | POA: Diagnosis not present

## 2021-09-04 DIAGNOSIS — R600 Localized edema: Secondary | ICD-10-CM | POA: Diagnosis not present

## 2021-09-04 LAB — CBC WITH DIFFERENTIAL/PLATELET
Absolute Monocytes: 405 cells/uL (ref 200–950)
Basophils Absolute: 16 cells/uL (ref 0–200)
Basophils Relative: 0.2 %
Eosinophils Absolute: 16 cells/uL (ref 15–500)
Eosinophils Relative: 0.2 %
HCT: 25.8 % — ABNORMAL LOW (ref 35.0–45.0)
Hemoglobin: 8.7 g/dL — ABNORMAL LOW (ref 11.7–15.5)
Lymphs Abs: 729 cells/uL — ABNORMAL LOW (ref 850–3900)
MCH: 34 pg — ABNORMAL HIGH (ref 27.0–33.0)
MCHC: 33.7 g/dL (ref 32.0–36.0)
MCV: 100.8 fL — ABNORMAL HIGH (ref 80.0–100.0)
MPV: 11.6 fL (ref 7.5–12.5)
Monocytes Relative: 5 %
Neutro Abs: 6934 cells/uL (ref 1500–7800)
Neutrophils Relative %: 85.6 %
Platelets: 112 10*3/uL — ABNORMAL LOW (ref 140–400)
RBC: 2.56 10*6/uL — ABNORMAL LOW (ref 3.80–5.10)
RDW: 15.3 % — ABNORMAL HIGH (ref 11.0–15.0)
Total Lymphocyte: 9 %
WBC: 8.1 10*3/uL (ref 3.8–10.8)

## 2021-09-04 LAB — COMPLETE METABOLIC PANEL WITH GFR
AG Ratio: 1.1 (calc) (ref 1.0–2.5)
ALT: 16 U/L (ref 6–29)
AST: 27 U/L (ref 10–35)
Albumin: 3.3 g/dL — ABNORMAL LOW (ref 3.6–5.1)
Alkaline phosphatase (APISO): 136 U/L (ref 37–153)
BUN/Creatinine Ratio: 13 (calc) (ref 6–22)
BUN: 5 mg/dL — ABNORMAL LOW (ref 7–25)
CO2: 34 mmol/L — ABNORMAL HIGH (ref 20–32)
Calcium: 8.8 mg/dL (ref 8.6–10.4)
Chloride: 94 mmol/L — ABNORMAL LOW (ref 98–110)
Creat: 0.4 mg/dL — ABNORMAL LOW (ref 0.50–1.03)
Globulin: 2.9 g/dL (calc) (ref 1.9–3.7)
Glucose, Bld: 92 mg/dL (ref 65–99)
Potassium: 2.8 mmol/L — ABNORMAL LOW (ref 3.5–5.3)
Sodium: 142 mmol/L (ref 135–146)
Total Bilirubin: 2 mg/dL — ABNORMAL HIGH (ref 0.2–1.2)
Total Protein: 6.2 g/dL (ref 6.1–8.1)
eGFR: 115 mL/min/{1.73_m2} (ref >=60)

## 2021-09-04 NOTE — Telephone Encounter (Signed)
Cecilia with [facility] [phone]  Adria Devon order for PT 1 week for 1, 2 week 2, and 1 week 2.   Verbal order given.

## 2021-09-05 DIAGNOSIS — Z9181 History of falling: Secondary | ICD-10-CM | POA: Diagnosis not present

## 2021-09-05 DIAGNOSIS — R600 Localized edema: Secondary | ICD-10-CM | POA: Diagnosis not present

## 2021-09-05 DIAGNOSIS — D519 Vitamin B12 deficiency anemia, unspecified: Secondary | ICD-10-CM | POA: Diagnosis not present

## 2021-09-05 DIAGNOSIS — F17210 Nicotine dependence, cigarettes, uncomplicated: Secondary | ICD-10-CM | POA: Diagnosis not present

## 2021-09-05 DIAGNOSIS — M549 Dorsalgia, unspecified: Secondary | ICD-10-CM | POA: Diagnosis not present

## 2021-09-05 DIAGNOSIS — E876 Hypokalemia: Secondary | ICD-10-CM | POA: Diagnosis not present

## 2021-09-05 DIAGNOSIS — I10 Essential (primary) hypertension: Secondary | ICD-10-CM | POA: Diagnosis not present

## 2021-09-05 DIAGNOSIS — G8929 Other chronic pain: Secondary | ICD-10-CM | POA: Diagnosis not present

## 2021-09-05 DIAGNOSIS — G629 Polyneuropathy, unspecified: Secondary | ICD-10-CM | POA: Diagnosis not present

## 2021-09-05 DIAGNOSIS — G4700 Insomnia, unspecified: Secondary | ICD-10-CM | POA: Diagnosis not present

## 2021-09-05 DIAGNOSIS — E559 Vitamin D deficiency, unspecified: Secondary | ICD-10-CM | POA: Diagnosis not present

## 2021-09-05 DIAGNOSIS — K760 Fatty (change of) liver, not elsewhere classified: Secondary | ICD-10-CM | POA: Diagnosis not present

## 2021-09-05 DIAGNOSIS — Z79891 Long term (current) use of opiate analgesic: Secondary | ICD-10-CM | POA: Diagnosis not present

## 2021-09-05 DIAGNOSIS — E440 Moderate protein-calorie malnutrition: Secondary | ICD-10-CM | POA: Diagnosis not present

## 2021-09-08 ENCOUNTER — Other Ambulatory Visit: Payer: Self-pay

## 2021-09-08 DIAGNOSIS — D519 Vitamin B12 deficiency anemia, unspecified: Secondary | ICD-10-CM

## 2021-09-08 DIAGNOSIS — E876 Hypokalemia: Secondary | ICD-10-CM

## 2021-09-11 DIAGNOSIS — F17210 Nicotine dependence, cigarettes, uncomplicated: Secondary | ICD-10-CM | POA: Diagnosis not present

## 2021-09-11 DIAGNOSIS — E876 Hypokalemia: Secondary | ICD-10-CM | POA: Diagnosis not present

## 2021-09-11 DIAGNOSIS — Z79891 Long term (current) use of opiate analgesic: Secondary | ICD-10-CM | POA: Diagnosis not present

## 2021-09-11 DIAGNOSIS — I10 Essential (primary) hypertension: Secondary | ICD-10-CM | POA: Diagnosis not present

## 2021-09-11 DIAGNOSIS — G4700 Insomnia, unspecified: Secondary | ICD-10-CM | POA: Diagnosis not present

## 2021-09-11 DIAGNOSIS — K760 Fatty (change of) liver, not elsewhere classified: Secondary | ICD-10-CM | POA: Diagnosis not present

## 2021-09-11 DIAGNOSIS — E440 Moderate protein-calorie malnutrition: Secondary | ICD-10-CM | POA: Diagnosis not present

## 2021-09-11 DIAGNOSIS — G629 Polyneuropathy, unspecified: Secondary | ICD-10-CM | POA: Diagnosis not present

## 2021-09-11 DIAGNOSIS — E559 Vitamin D deficiency, unspecified: Secondary | ICD-10-CM | POA: Diagnosis not present

## 2021-09-11 DIAGNOSIS — G8929 Other chronic pain: Secondary | ICD-10-CM | POA: Diagnosis not present

## 2021-09-11 DIAGNOSIS — M549 Dorsalgia, unspecified: Secondary | ICD-10-CM | POA: Diagnosis not present

## 2021-09-11 DIAGNOSIS — Z9181 History of falling: Secondary | ICD-10-CM | POA: Diagnosis not present

## 2021-09-11 DIAGNOSIS — R600 Localized edema: Secondary | ICD-10-CM | POA: Diagnosis not present

## 2021-09-11 DIAGNOSIS — D519 Vitamin B12 deficiency anemia, unspecified: Secondary | ICD-10-CM | POA: Diagnosis not present

## 2021-09-12 ENCOUNTER — Other Ambulatory Visit: Payer: Medicare Other

## 2021-09-12 DIAGNOSIS — E876 Hypokalemia: Secondary | ICD-10-CM

## 2021-09-12 DIAGNOSIS — D519 Vitamin B12 deficiency anemia, unspecified: Secondary | ICD-10-CM

## 2021-09-18 DIAGNOSIS — D519 Vitamin B12 deficiency anemia, unspecified: Secondary | ICD-10-CM | POA: Diagnosis not present

## 2021-09-18 DIAGNOSIS — Z9181 History of falling: Secondary | ICD-10-CM | POA: Diagnosis not present

## 2021-09-18 DIAGNOSIS — I10 Essential (primary) hypertension: Secondary | ICD-10-CM | POA: Diagnosis not present

## 2021-09-18 DIAGNOSIS — E559 Vitamin D deficiency, unspecified: Secondary | ICD-10-CM | POA: Diagnosis not present

## 2021-09-18 DIAGNOSIS — E876 Hypokalemia: Secondary | ICD-10-CM | POA: Diagnosis not present

## 2021-09-18 DIAGNOSIS — K760 Fatty (change of) liver, not elsewhere classified: Secondary | ICD-10-CM | POA: Diagnosis not present

## 2021-09-18 DIAGNOSIS — E440 Moderate protein-calorie malnutrition: Secondary | ICD-10-CM | POA: Diagnosis not present

## 2021-09-18 DIAGNOSIS — M549 Dorsalgia, unspecified: Secondary | ICD-10-CM | POA: Diagnosis not present

## 2021-09-18 DIAGNOSIS — G8929 Other chronic pain: Secondary | ICD-10-CM | POA: Diagnosis not present

## 2021-09-18 DIAGNOSIS — Z79891 Long term (current) use of opiate analgesic: Secondary | ICD-10-CM | POA: Diagnosis not present

## 2021-09-18 DIAGNOSIS — R600 Localized edema: Secondary | ICD-10-CM | POA: Diagnosis not present

## 2021-09-18 DIAGNOSIS — F17210 Nicotine dependence, cigarettes, uncomplicated: Secondary | ICD-10-CM | POA: Diagnosis not present

## 2021-09-18 DIAGNOSIS — G629 Polyneuropathy, unspecified: Secondary | ICD-10-CM | POA: Diagnosis not present

## 2021-09-18 DIAGNOSIS — G4700 Insomnia, unspecified: Secondary | ICD-10-CM | POA: Diagnosis not present

## 2021-09-22 DIAGNOSIS — G4700 Insomnia, unspecified: Secondary | ICD-10-CM | POA: Diagnosis not present

## 2021-09-22 DIAGNOSIS — G629 Polyneuropathy, unspecified: Secondary | ICD-10-CM | POA: Diagnosis not present

## 2021-09-22 DIAGNOSIS — G8929 Other chronic pain: Secondary | ICD-10-CM | POA: Diagnosis not present

## 2021-09-22 DIAGNOSIS — E559 Vitamin D deficiency, unspecified: Secondary | ICD-10-CM | POA: Diagnosis not present

## 2021-09-22 DIAGNOSIS — F17210 Nicotine dependence, cigarettes, uncomplicated: Secondary | ICD-10-CM | POA: Diagnosis not present

## 2021-09-22 DIAGNOSIS — Z9181 History of falling: Secondary | ICD-10-CM | POA: Diagnosis not present

## 2021-09-22 DIAGNOSIS — E440 Moderate protein-calorie malnutrition: Secondary | ICD-10-CM | POA: Diagnosis not present

## 2021-09-22 DIAGNOSIS — K760 Fatty (change of) liver, not elsewhere classified: Secondary | ICD-10-CM | POA: Diagnosis not present

## 2021-09-22 DIAGNOSIS — D519 Vitamin B12 deficiency anemia, unspecified: Secondary | ICD-10-CM | POA: Diagnosis not present

## 2021-09-22 DIAGNOSIS — R600 Localized edema: Secondary | ICD-10-CM | POA: Diagnosis not present

## 2021-09-22 DIAGNOSIS — Z79891 Long term (current) use of opiate analgesic: Secondary | ICD-10-CM | POA: Diagnosis not present

## 2021-09-22 DIAGNOSIS — M549 Dorsalgia, unspecified: Secondary | ICD-10-CM | POA: Diagnosis not present

## 2021-09-22 DIAGNOSIS — E876 Hypokalemia: Secondary | ICD-10-CM | POA: Diagnosis not present

## 2021-09-22 DIAGNOSIS — I10 Essential (primary) hypertension: Secondary | ICD-10-CM | POA: Diagnosis not present

## 2021-09-23 ENCOUNTER — Other Ambulatory Visit: Payer: Self-pay | Admitting: *Deleted

## 2021-09-23 ENCOUNTER — Telehealth: Payer: Self-pay

## 2021-09-23 DIAGNOSIS — G8929 Other chronic pain: Secondary | ICD-10-CM

## 2021-09-23 MED ORDER — OXYCODONE-ACETAMINOPHEN 5-325 MG PO TABS: ORAL_TABLET | ORAL | 0 refills | Status: DC

## 2021-09-23 NOTE — Telephone Encounter (Signed)
Patient called requesting refill.  Epic LR: 08/25/2021 Contract Date: 01/03/2021 Pended Rx and sent to [facility] for approval.

## 2021-09-23 NOTE — Telephone Encounter (Signed)
Ok to continue per [facility] protocol

## 2021-09-23 NOTE — Telephone Encounter (Signed)
Call returned to [facility] and verbal order provided per [facility] standing order/protocol

## 2021-09-23 NOTE — Telephone Encounter (Signed)
Laura [name] PT with [facility]. Requesting orders from Areatha Keas, NP to continue home health PT with him for frequency 1 week for 3. [phone]

## 2021-09-24 ENCOUNTER — Ambulatory Visit (INDEPENDENT_AMBULATORY_CARE_PROVIDER_SITE_OTHER): Payer: Medicare Other | Admitting: Physician Assistant

## 2021-09-24 ENCOUNTER — Encounter: Payer: Self-pay | Admitting: Physician Assistant

## 2021-09-24 VITALS — BP 102/64 | HR 93 | Ht 67.0 in | Wt 154.2 lb

## 2021-09-24 DIAGNOSIS — E876 Hypokalemia: Secondary | ICD-10-CM

## 2021-09-24 DIAGNOSIS — I471 Supraventricular tachycardia: Secondary | ICD-10-CM

## 2021-09-24 DIAGNOSIS — D649 Anemia, unspecified: Secondary | ICD-10-CM

## 2021-09-24 DIAGNOSIS — I10 Essential (primary) hypertension: Secondary | ICD-10-CM | POA: Diagnosis not present

## 2021-09-24 NOTE — Patient Instructions (Signed)
Medication Instructions:  Your physician recommends that you continue on your current medications as directed. Please refer to the Current Medication list given to you today.  *If you need a refill on your cardiac medications before your next appointment, please call your pharmacy*   Lab Work: NONE ordered at this time of appointment   If you have labs (blood work) drawn today and your tests are completely normal, you will receive your results only by: MyChart Message (if you have MyChart) OR A paper copy in the mail If you have any lab test that is abnormal or we need to change your treatment, we will call you to review the results.  Testing/Procedures: NONE ordered at this time of appointment   Follow-Up: At [facility], you and your health needs are our priority.  As part of our continuing mission to provide you with exceptional heart care, we have created designated Provider Care Teams.  These Care Teams include your primary Cardiologist (physician) and Advanced Practice Providers (APPs -  Physician Assistants and Nurse Practitioners) who all work together to provide you with the care you need, when you need it.    Your next appointment:   3-4 month(s)  The format for your next appointment:   In Person  Provider:   Dietrich Pates, MD     Other Instructions   Important Information About Sugar
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
## 2021-09-26 ENCOUNTER — Ambulatory Visit: Payer: Medicare Other | Admitting: Nurse Practitioner

## 2021-09-26 ENCOUNTER — Encounter: Payer: Self-pay | Admitting: Physician Assistant

## 2021-09-29 ENCOUNTER — Encounter: Payer: Self-pay | Admitting: Nurse Practitioner

## 2021-09-29 ENCOUNTER — Ambulatory Visit (INDEPENDENT_AMBULATORY_CARE_PROVIDER_SITE_OTHER): Payer: Medicare Other | Admitting: Nurse Practitioner

## 2021-09-29 VITALS — BP 100/80 | HR 70 | Temp 97.1°F | Resp 18 | Ht 67.0 in

## 2021-09-29 DIAGNOSIS — G629 Polyneuropathy, unspecified: Secondary | ICD-10-CM

## 2021-09-29 DIAGNOSIS — E559 Vitamin D deficiency, unspecified: Secondary | ICD-10-CM | POA: Diagnosis not present

## 2021-09-29 DIAGNOSIS — D539 Nutritional anemia, unspecified: Secondary | ICD-10-CM | POA: Diagnosis not present

## 2021-09-29 DIAGNOSIS — E440 Moderate protein-calorie malnutrition: Secondary | ICD-10-CM | POA: Diagnosis not present

## 2021-09-29 DIAGNOSIS — G8929 Other chronic pain: Secondary | ICD-10-CM | POA: Diagnosis not present

## 2021-09-29 DIAGNOSIS — K089 Disorder of teeth and supporting structures, unspecified: Secondary | ICD-10-CM | POA: Diagnosis not present

## 2021-09-29 DIAGNOSIS — M5441 Lumbago with sciatica, right side: Secondary | ICD-10-CM

## 2021-09-29 DIAGNOSIS — R600 Localized edema: Secondary | ICD-10-CM | POA: Diagnosis not present

## 2021-09-29 DIAGNOSIS — D519 Vitamin B12 deficiency anemia, unspecified: Secondary | ICD-10-CM

## 2021-09-29 MED ORDER — GABAPENTIN 100 MG PO CAPS: 100.0000 mg | ORAL_CAPSULE | Freq: Three times a day (TID) | ORAL | 3 refills | Status: DC

## 2021-09-29 NOTE — Patient Instructions (Addendum)
Restart Vit B12 1000 mcg daily   Gabapentin 1-2 tablets three times daily as needed for neuropathy

## 2021-09-29 NOTE — Progress Notes (Signed)
Careteam: Patient Care Team: Lauree Chandler, NP as PCP - General (Geriatric Medicine) Fay Records, MD as PCP - Cardiology (Cardiology)  PLACE OF SERVICE:  [place] Directive information Does Patient Have a Medical Advance Directive?: No, Would patient like information on creating a medical advance directive?: No - Patient declined  Allergies  Allergen Reactions   Garlic Anaphylaxis   Aspirin Hives   Lyrica [Pregabalin]     Nightmares, did not help neuropathy   Penicillins Hives    Chief Complaint  Patient presents with   Medical Management of Chronic Issues    6 week follow up on foot/leg swelling.     HPI: Patient is a 61 y.o. adult for follow up  Reports getting stronger daily, still using walker.   LE edema is better- stopped lasix at last visit 6 weeks ago and swelling has not worsened.   Numbness and tingling is bad in bilateral feet. Started in hospital and has not gone away Not taking b12 supplement  Waiting on dentist to remove teeth- having a hard time chewing Roots are bad and having recurrent infection.  Having a hard time finding someone in network.   Review of Systems:  Review of Systems  Constitutional:  Negative for chills, fever and weight loss.  HENT:  Negative for tinnitus.   Respiratory:  Negative for cough, sputum production and shortness of breath.   Cardiovascular:  Negative for chest pain, palpitations and leg swelling.  Gastrointestinal:  Negative for abdominal pain, constipation, diarrhea and heartburn.  Genitourinary:  Negative for dysuria, frequency and urgency.  Musculoskeletal:  Negative for back pain, falls, joint pain and myalgias.  Skin: Negative.   Neurological:  Negative for dizziness and headaches.  Psychiatric/Behavioral:  Negative for depression and memory loss. The patient does not have insomnia.     Past Medical History:  Diagnosis Date   Acute bronchitis    Cervical high risk HPV (human  papillomavirus) test positive 12/2017   Negative subtype 16, 18/45   Chronic back pain    Complication of anesthesia    "didn't wake up"   Cough    Insomnia, unspecified    Other malaise and fatigue    Other specified diseases of blood and blood-forming organs(289.89)    Tobacco use disorder    Past Surgical History:  Procedure Laterality Date   BIOPSY  07/01/2021   Procedure: BIOPSY;  Surgeon: Carol Ada, MD;  Location: [facility] ENDOSCOPY;  Service: Gastroenterology;;  EGD and COLON   COLONOSCOPY WITH PROPOFOL N/A 07/01/2021   Procedure: COLONOSCOPY WITH PROPOFOL;  Surgeon: Carol Ada, MD;  Location: [place];  Service: Gastroenterology;  Laterality: N/A;   ESOPHAGOGASTRODUODENOSCOPY N/A 03/17/2013   Procedure: ESOPHAGOGASTRODUODENOSCOPY (EGD);  Surgeon: Ladene Artist, MD;  Location: [facility] ENDOSCOPY;  Service: Endoscopy;  Laterality: N/A;   ESOPHAGOGASTRODUODENOSCOPY (EGD) WITH PROPOFOL N/A 07/01/2021   Procedure: ESOPHAGOGASTRODUODENOSCOPY (EGD) WITH PROPOFOL;  Surgeon: Carol Ada, MD;  Location: [place];  Service: Gastroenterology;  Laterality: N/A;   FRACTURE SURGERY     Laminectomy and foraminotomy at L5-S1    12/12/2019   TONSILLECTOMY     Social History:   reports that he has been smoking cigarettes. He has a 6.00 pack-year smoking history. He has never used smokeless tobacco. He reports that he does not currently use alcohol. He reports that he does not use drugs.  Family History  Problem Relation Age of Onset   Arthritis Mother    Hypertension Sister  Hypertension Brother    Hypertension Sister    Hypertension Sister    Hypertension Sister     Medications: Patient's Medications  New Prescriptions   No medications on file  Previous Medications   ALBUTEROL (VENTOLIN HFA) 108 (90 BASE) MCG/ACT INHALER    INHALE 2 PUFFS INTO THE LUNGS EVERY 6 HOURS AS NEEDED FOR WHEEZING OR SHORTNESS OF BREATH   CIPROFLOXACIN (CILOXAN) 0.3 % OPHTHALMIC SOLUTION    Place 1  drop into both eyes every 2 (two) hours. Administer 1 drop, every 2 hours, while awake, for 2 days. Then 1 drop, every 4 hours, while awake, for the next 5 days.   FERROUS SULFATE 325 (65 FE) MG TABLET    Take 325 mg by mouth 2 (two) times daily with a meal.   FOLIC ACID (FOLVITE) 1 MG TABLET    Take 1 tablet (1 mg total) by mouth daily.   MAGNESIUM OXIDE (MAG-OX) 400 MG TABLET    Take 1 tablet (400 mg total) by mouth 2 (two) times daily.   METOPROLOL TARTRATE (LOPRESSOR) 25 MG TABLET    Take 0.5 tablets (12.5 mg total) by mouth 2 (two) times daily.   NYSTATIN POWDER    Apply 1 application. topically 3 (three) times daily.   OXYCODONE-ACETAMINOPHEN (PERCOCET/ROXICET) 5-325 MG TABLET    Take one tablet by mouth every 8-12 hours as needed for pain.   PANTOPRAZOLE (PROTONIX) 40 MG TABLET    Take 1 tablet (40 mg total) by mouth daily.   TETRAHYDROZOLINE HCL (VISINE OP)    Place 1 drop into both eyes daily as needed (For dry eyes).   VITAMIN D, ERGOCALCIFEROL, (DRISDOL) 1.25 MG (50000 UNIT) CAPS CAPSULE    Take 1 capsule (50,000 Units total) by mouth every 7 (seven) days.  Modified Medications   No medications on file  Discontinued Medications   No medications on file    Physical Exam:  Vitals:   09/29/21 1457  BP: 100/80  Pulse: 70  Resp: 18  Temp: (!) 97.1 F (36.2 C)  SpO2: 98%  Height: '5\' 7"'  (1.702 m)   Body mass index is 24.15 kg/m. Wt Readings from Last 3 Encounters:  09/24/21 154 lb 3.2 oz (69.9 kg)  08/04/21 169 lb 9.6 oz (76.9 kg)  06/29/21 189 lb 9.5 oz (86 kg)    Physical Exam Constitutional:      General: He is not in acute distress.    Appearance: He is well-developed. He is not diaphoretic.  HENT:     Head: Normocephalic and atraumatic.     Mouth/Throat:     Pharynx: No oropharyngeal exudate.  Eyes:     Conjunctiva/sclera: Conjunctivae normal.     Pupils: Pupils are equal, round, and reactive to light.  Cardiovascular:     Rate and Rhythm: Normal rate and  regular rhythm.     Heart sounds: Normal heart sounds.  Pulmonary:     Effort: Pulmonary effort is normal.     Breath sounds: Normal breath sounds.  Abdominal:     General: Bowel sounds are normal.     Palpations: Abdomen is soft.  Musculoskeletal:     Cervical back: Normal range of motion and neck supple.     Right lower leg: No edema.     Left lower leg: No edema.  Skin:    General: Skin is warm and dry.  Neurological:     Mental Status: He is alert and oriented to person, place, and time. Mental status is  at baseline.     Gait: Gait abnormal (in wheelchair).  Psychiatric:        Mood and Affect: Mood normal.     Labs reviewed: Basic Metabolic Panel: Recent Labs    06/30/21 0005 06/30/21 0435 06/30/21 0752 08/02/21 0352 08/03/21 0416 08/04/21 0234 08/15/21 1437 09/03/21 1410  NA 137 136   < > 132* 131* 132* 138 142  K 2.8* 2.7*   < > 4.1 4.2 3.6 4.6 2.8*  CL 98 100   < > 96* 96* 97* 104 94*  CO2 28 28   < > '28 25 28 24 ' 34*  GLUCOSE 100* 104*   < > 80 74 86 60* 92  BUN <5* <5*   < > '7 8 7 7 ' 5*  CREATININE 0.60 0.51   < > 0.54 0.64 0.71 0.42* 0.40*  CALCIUM 7.2* 7.2*   < > 8.2* 8.6* 8.5* 8.8 8.8  MG 1.3*  --    < > 2.1 1.8  --  1.7  --   PHOS 2.4*  --   --   --  2.7  --   --   --   TSH  --  2.961  --   --   --   --   --   --    < > = values in this interval not displayed.   Liver Function Tests: Recent Labs    08/02/21 0352 08/03/21 0416 08/04/21 0234 08/15/21 1437 09/03/21 1410  AST 42* 45* 34 34 27  ALT '19 20 16 12 16  ' ALKPHOS 161* 190* 157*  --   --   BILITOT 5.9* 6.6* 4.2* 2.0* 2.0*  PROT 5.0* 6.2* 5.2* 6.4 6.2  ALBUMIN 2.4* 2.7*  2.6* 2.2*  --   --    No results for input(s): "LIPASE", "AMYLASE" in the last 8760 hours. No results for input(s): "AMMONIA" in the last 8760 hours. CBC: Recent Labs    08/03/21 0416 08/04/21 0234 08/15/21 1437 09/03/21 1410  WBC 8.2 7.1 6.9 8.1  NEUTROABS 7.0  --  4,865 6,934  HGB 9.4* 7.9* 9.4* 8.7*  HCT  27.7* 23.8* 28.7* 25.8*  MCV 97.2 97.5 98.0 100.8*  PLT 95* 94* 342 112*   Lipid Panel: Recent Labs    01/03/21 1151  CHOL 140  HDL 80  LDLCALC 45  TRIG 70  CHOLHDL 1.8   TSH: Recent Labs    06/30/21 0435  TSH 2.961   A1C: Lab Results  Component Value Date   HGBA1C 4.8 07/01/2015     Assessment/Plan 1. Neuropathy Worse since hospitalization, ?multifactorial due to nutritional deficit vs sciatica due to progressive spinal stenosis  - CMP with eGFR([facility]) - CBC with Differential/Platelet - Iron, TIBC and Ferritin Panel - gabapentin (NEURONTIN) 100 MG capsule; Take 1 capsule (100 mg total) by mouth 3 (three) times daily.  Dispense: 90 capsule; Refill: 3  2. Anemia due to vitamin B12 deficiency, unspecified B12 deficiency type -currently not on b12 at this time, will have him restart at this time.   3. Anemia associated with nutritional deficiency -continues on iron twice daily, folate and b12 - CBC with Differential/Platelet - Iron, TIBC and Ferritin Panel  4. Lower extremity edema -minimal at this time  5. Poor dentition Effecting eating, working with dentist to find oral surgeon to remove teeth  6. Moderate protein-calorie malnutrition ([place]) -ongoing, soft foods, continue nutritional supplement.   7. Vitamin D deficiency Continues on supplement  8. Chronic right-sided low  back pain with right-sided sciatica Due to spinal stenosis, no worsening of pain in back. Has not continue to follow up with neurosurgery as he is not interested in any further surgeries. Continues on oxycodone PRN.    Return in about 2 months (around 11/29/2021) for routine follow up. Carlos American. [place], [facility] [phone]

## 2021-09-30 LAB — COMPLETE METABOLIC PANEL WITH GFR
AG Ratio: 0.9 (calc) — ABNORMAL LOW (ref 1.0–2.5)
ALT: 11 U/L (ref 6–29)
AST: 42 U/L — ABNORMAL HIGH (ref 10–35)
Albumin: 2.9 g/dL — ABNORMAL LOW (ref 3.6–5.1)
Alkaline phosphatase (APISO): 187 U/L — ABNORMAL HIGH (ref 37–153)
BUN/Creatinine Ratio: 15 (calc) (ref 6–22)
BUN: 7 mg/dL (ref 7–25)
CO2: 29 mmol/L (ref 20–32)
Calcium: 8.5 mg/dL — ABNORMAL LOW (ref 8.6–10.4)
Chloride: 96 mmol/L — ABNORMAL LOW (ref 98–110)
Creat: 0.46 mg/dL — ABNORMAL LOW (ref 0.50–1.03)
Globulin: 3.4 g/dL (calc) (ref 1.9–3.7)
Glucose, Bld: 76 mg/dL (ref 65–139)
Potassium: 4.3 mmol/L (ref 3.5–5.3)
Sodium: 139 mmol/L (ref 135–146)
Total Bilirubin: 1.6 mg/dL — ABNORMAL HIGH (ref 0.2–1.2)
Total Protein: 6.3 g/dL (ref 6.1–8.1)
eGFR: 110 mL/min/{1.73_m2} (ref >=60)

## 2021-09-30 LAB — CBC WITH DIFFERENTIAL/PLATELET
Absolute Monocytes: 179 cells/uL — ABNORMAL LOW (ref 200–950)
Basophils Absolute: 61 cells/uL (ref 0–200)
Basophils Relative: 1.3 %
Eosinophils Absolute: 89 cells/uL (ref 15–500)
Eosinophils Relative: 1.9 %
HCT: 27.3 % — ABNORMAL LOW (ref 35.0–45.0)
Hemoglobin: 9.2 g/dL — ABNORMAL LOW (ref 11.7–15.5)
Lymphs Abs: 902 cells/uL (ref 850–3900)
MCH: 35.2 pg — ABNORMAL HIGH (ref 27.0–33.0)
MCHC: 33.7 g/dL (ref 32.0–36.0)
MCV: 104.6 fL — ABNORMAL HIGH (ref 80.0–100.0)
MPV: 12 fL (ref 7.5–12.5)
Monocytes Relative: 3.8 %
Neutro Abs: 3469 cells/uL (ref 1500–7800)
Neutrophils Relative %: 73.8 %
Platelets: 161 10*3/uL (ref 140–400)
RBC: 2.61 10*6/uL — ABNORMAL LOW (ref 3.80–5.10)
RDW: 17.4 % — ABNORMAL HIGH (ref 11.0–15.0)
Total Lymphocyte: 19.2 %
WBC: 4.7 10*3/uL (ref 3.8–10.8)

## 2021-09-30 LAB — IRON,TIBC AND FERRITIN PANEL
%SAT: 93 % (calc) — ABNORMAL HIGH (ref 16–45)
Ferritin: 956 ng/mL — ABNORMAL HIGH (ref 16–232)
Iron: 137 ug/dL (ref 45–160)
TIBC: 147 mcg/dL (calc) — ABNORMAL LOW (ref 250–450)

## 2021-10-02 DIAGNOSIS — I10 Essential (primary) hypertension: Secondary | ICD-10-CM | POA: Diagnosis not present

## 2021-10-02 DIAGNOSIS — G4700 Insomnia, unspecified: Secondary | ICD-10-CM | POA: Diagnosis not present

## 2021-10-02 DIAGNOSIS — R600 Localized edema: Secondary | ICD-10-CM | POA: Diagnosis not present

## 2021-10-02 DIAGNOSIS — Z9181 History of falling: Secondary | ICD-10-CM | POA: Diagnosis not present

## 2021-10-02 DIAGNOSIS — E440 Moderate protein-calorie malnutrition: Secondary | ICD-10-CM | POA: Diagnosis not present

## 2021-10-02 DIAGNOSIS — G8929 Other chronic pain: Secondary | ICD-10-CM | POA: Diagnosis not present

## 2021-10-02 DIAGNOSIS — G629 Polyneuropathy, unspecified: Secondary | ICD-10-CM | POA: Diagnosis not present

## 2021-10-02 DIAGNOSIS — M549 Dorsalgia, unspecified: Secondary | ICD-10-CM | POA: Diagnosis not present

## 2021-10-02 DIAGNOSIS — E559 Vitamin D deficiency, unspecified: Secondary | ICD-10-CM | POA: Diagnosis not present

## 2021-10-02 DIAGNOSIS — E876 Hypokalemia: Secondary | ICD-10-CM | POA: Diagnosis not present

## 2021-10-02 DIAGNOSIS — F17210 Nicotine dependence, cigarettes, uncomplicated: Secondary | ICD-10-CM | POA: Diagnosis not present

## 2021-10-02 DIAGNOSIS — D519 Vitamin B12 deficiency anemia, unspecified: Secondary | ICD-10-CM | POA: Diagnosis not present

## 2021-10-02 DIAGNOSIS — Z79891 Long term (current) use of opiate analgesic: Secondary | ICD-10-CM | POA: Diagnosis not present

## 2021-10-02 DIAGNOSIS — K760 Fatty (change of) liver, not elsewhere classified: Secondary | ICD-10-CM | POA: Diagnosis not present

## 2021-10-04 ENCOUNTER — Other Ambulatory Visit: Payer: Self-pay | Admitting: Nurse Practitioner

## 2021-10-04 DIAGNOSIS — R600 Localized edema: Secondary | ICD-10-CM

## 2021-10-10 ENCOUNTER — Other Ambulatory Visit: Payer: Medicare Other

## 2021-10-14 ENCOUNTER — Other Ambulatory Visit: Payer: Medicare Other

## 2021-10-15 ENCOUNTER — Telehealth: Payer: Self-pay

## 2021-10-15 NOTE — Telephone Encounter (Signed)
Tried calling patient regarding fax sent from [facility] requesting a refill for Metoprolol tartrate. Patient has Rx filled by his local pharmacy, however, want to clarity with patient.

## 2021-10-16 NOTE — Telephone Encounter (Signed)
Patient called and stated that he is not using the [employer]. Printed out order from [employer] and wrote that "he is not using [facility]" and placed into the outgoing fax mail box.   No further action is required at this time.

## 2021-10-20 ENCOUNTER — Telehealth: Payer: Self-pay

## 2021-10-20 DIAGNOSIS — R059 Cough, unspecified: Secondary | ICD-10-CM

## 2021-10-20 MED ORDER — ALBUTEROL SULFATE HFA 108 (90 BASE) MCG/ACT IN AERS: 2.0000 | INHALATION_SPRAY | Freq: Four times a day (QID) | RESPIRATORY_TRACT | 3 refills | Status: DC | PRN

## 2021-10-20 NOTE — Telephone Encounter (Signed)
Pharmacy faxed a rx request for Ventolin inhaler and medication was send into pharmacy.

## 2021-10-20 NOTE — Telephone Encounter (Signed)
[facility] send a refill request for Potassium tablets for patient, but medication not on active medlist. Tried calling patient to see if he was taking the medication and to get the dose & frequency. Left a message to return call and waiting for patient to call back.

## 2021-10-23 ENCOUNTER — Other Ambulatory Visit: Payer: Self-pay

## 2021-10-23 ENCOUNTER — Telehealth: Payer: Self-pay

## 2021-10-23 ENCOUNTER — Encounter: Payer: Self-pay | Admitting: Nurse Practitioner

## 2021-10-23 ENCOUNTER — Ambulatory Visit (INDEPENDENT_AMBULATORY_CARE_PROVIDER_SITE_OTHER): Payer: Medicare Other | Admitting: Nurse Practitioner

## 2021-10-23 DIAGNOSIS — Z0000 Encounter for general adult medical examination without abnormal findings: Secondary | ICD-10-CM

## 2021-10-23 DIAGNOSIS — G8929 Other chronic pain: Secondary | ICD-10-CM

## 2021-10-23 MED ORDER — OXYCODONE-ACETAMINOPHEN 5-325 MG PO TABS: ORAL_TABLET | ORAL | 0 refills | Status: DC

## 2021-10-23 NOTE — Patient Instructions (Signed)
Laura [name] , Thank you for taking time to come for your Medicare Wellness Visit. I appreciate your ongoing commitment to your health goals. Please review the following plan we discussed and let me know if I can assist you in the future.   Screening recommendations/referrals: Colonoscopy up to date Mammogram up to date Bone Density- due to at 65. Recommended yearly ophthalmology/optometry visit for glaucoma screening and checkup Recommended yearly dental visit for hygiene and checkup  Vaccinations: Influenza vaccine up to date Pneumococcal vaccine up to date Tdap vaccine up to date Shingles vaccine DUE- recommend to get at your local pharmacy       Advanced directives: recommended to complete.   Conditions/risks identified: smoking,   Next appointment: yearly for awv   Preventive Care 65 Years and Older, Female Preventive care refers to lifestyle choices and visits with your health care provider that can promote health and wellness. What does preventive care include? A yearly physical exam. This is also called an annual well check. Dental exams once or twice a year. Routine eye exams. Ask your health care provider how often you should have your eyes checked. Personal lifestyle choices, including: Daily care of your teeth and gums. Regular physical activity. Eating a healthy diet. Avoiding tobacco and drug use. Limiting alcohol use. Practicing safe sex. Taking low-dose aspirin every day. Taking vitamin and mineral supplements as recommended by your health care provider. What happens during an annual well check? The services and screenings done by your health care provider during your annual well check will depend on your age, overall health, lifestyle risk factors, and family history of disease. Counseling  Your health care provider may ask you questions about your: Alcohol use. Tobacco use. Drug use. Emotional well-being. Home and relationship well-being. Sexual  activity. Eating habits. History of falls. Memory and ability to understand (cognition). Work and work Astronomer. Reproductive health. Screening  You may have the following tests or measurements: Height, weight, and BMI. Blood pressure. Lipid and cholesterol levels. These may be checked every 5 years, or more frequently if you are over 7 years old. Skin check. Lung cancer screening. You may have this screening every year starting at age 100 if you have a 30-pack-year history of smoking and currently smoke or have quit within the past 15 years. Fecal occult blood test (FOBT) of the stool. You may have this test every year starting at age 101. Flexible sigmoidoscopy or colonoscopy. You may have a sigmoidoscopy every 5 years or a colonoscopy every 10 years starting at age 53. Hepatitis C blood test. Hepatitis B blood test. Sexually transmitted disease (STD) testing. Diabetes screening. This is done by checking your blood sugar (glucose) after you have not eaten for a while (fasting). You may have this done every 1-3 years. Bone density scan. This is done to screen for osteoporosis. You may have this done starting at age 15. Mammogram. This may be done every 1-2 years. Talk to your health care provider about how often you should have regular mammograms. Talk with your health care provider about your test results, treatment options, and if necessary, the need for more tests. Vaccines  Your health care provider may recommend certain vaccines, such as: Influenza vaccine. This is recommended every year. Tetanus, diphtheria, and acellular pertussis (Tdap, Td) vaccine. You may need a Td booster every 10 years. Zoster vaccine. You may need this after age 18. Pneumococcal 13-valent conjugate (PCV13) vaccine. One dose is recommended after age 64. Pneumococcal polysaccharide (PPSV23) vaccine. One  dose is recommended after age 34. Talk to your health care provider about which screenings and vaccines  you need and how often you need them. This information is not intended to replace advice given to you by your health care provider. Make sure you discuss any questions you have with your health care provider. Document Released: 05/03/2015 Document Revised: 12/25/2015 Document Reviewed: 02/05/2015 Elsevier Interactive Patient Education  2017 [place] Prevention in the Home Falls can cause injuries. They can happen to people of all ages. There are many things you can do to make your home safe and to help prevent falls. What can I do on the outside of my home? Regularly fix the edges of walkways and driveways and fix any cracks. Remove anything that might make you trip as you walk through a door, such as a raised step or threshold. Trim any bushes or trees on the path to your home. Use bright outdoor lighting. Clear any walking paths of anything that might make someone trip, such as rocks or tools. Regularly check to see if handrails are loose or broken. Make sure that both sides of any steps have handrails. Any raised decks and porches should have guardrails on the edges. Have any leaves, snow, or ice cleared regularly. Use sand or salt on walking paths during winter. Clean up any spills in your garage right away. This includes oil or grease spills. What can I do in the bathroom? Use night lights. Install grab bars by the toilet and in the tub and shower. Do not use towel bars as grab bars. Use non-skid mats or decals in the tub or shower. If you need to sit down in the shower, use a plastic, non-slip stool. Keep the floor dry. Clean up any water that spills on the floor as soon as it happens. Remove soap buildup in the tub or shower regularly. Attach bath mats securely with double-sided non-slip rug tape. Do not have throw rugs and other things on the floor that can make you trip. What can I do in the bedroom? Use night lights. Make sure that you have a light by your bed that  is easy to reach. Do not use any sheets or blankets that are too big for your bed. They should not hang down onto the floor. Have a firm chair that has side arms. You can use this for support while you get dressed. Do not have throw rugs and other things on the floor that can make you trip. What can I do in the kitchen? Clean up any spills right away. Avoid walking on wet floors. Keep items that you use a lot in easy-to-reach places. If you need to reach something above you, use a strong step stool that has a grab bar. Keep electrical cords out of the way. Do not use floor polish or wax that makes floors slippery. If you must use wax, use non-skid floor wax. Do not have throw rugs and other things on the floor that can make you trip. What can I do with my stairs? Do not leave any items on the stairs. Make sure that there are handrails on both sides of the stairs and use them. Fix handrails that are broken or loose. Make sure that handrails are as long as the stairways. Check any carpeting to make sure that it is firmly attached to the stairs. Fix any carpet that is loose or worn. Avoid having throw rugs at the top or bottom of the  stairs. If you do have throw rugs, attach them to the floor with carpet tape. Make sure that you have a light switch at the top of the stairs and the bottom of the stairs. If you do not have them, ask someone to add them for you. What else can I do to help prevent falls? Wear shoes that: Do not have high heels. Have rubber bottoms. Are comfortable and fit you well. Are closed at the toe. Do not wear sandals. If you use a stepladder: Make sure that it is fully opened. Do not climb a closed stepladder. Make sure that both sides of the stepladder are locked into place. Ask someone to hold it for you, if possible. Clearly mark and make sure that you can see: Any grab bars or handrails. First and last steps. Where the edge of each step is. Use tools that help you  move around (mobility aids) if they are needed. These include: Canes. Walkers. Scooters. Crutches. Turn on the lights when you go into a dark area. Replace any light bulbs as soon as they burn out. Set up your furniture so you have a clear path. Avoid moving your furniture around. If any of your floors are uneven, fix them. If there are any pets around you, be aware of where they are. Review your medicines with your doctor. Some medicines can make you feel dizzy. This can increase your chance of falling. Ask your doctor what other things that you can do to help prevent falls. This information is not intended to replace advice given to you by your health care provider. Make sure you discuss any questions you have with your health care provider. Document Released: 01/31/2009 Document Revised: 09/12/2015 Document Reviewed: 05/11/2014 Elsevier Interactive Patient Education  2017 [employer].

## 2021-10-23 NOTE — Telephone Encounter (Signed)
[name], wiesen are scheduled for a virtual visit with your provider today.    Just as we do with appointments in the office, we must obtain your consent to participate.  Your consent will be active for this visit and any virtual visit you may have with one of our providers in the next 365 days.    If you have a MyChart account, I can also send a copy of this consent to you electronically.  All virtual visits are billed to your insurance company just like a traditional visit in the office.  As this is a virtual visit, video technology does not allow for your provider to perform a traditional examination.  This may limit your provider's ability to fully assess your condition.  If your provider identifies any concerns that need to be evaluated in person or the need to arrange testing such as labs, EKG, etc, we will make arrangements to do so.    Although advances in technology are sophisticated, we cannot ensure that it will always work on either your end or our end.  If the connection with a video visit is poor, we may have to switch to a telephone visit.  With either a video or telephone visit, we are not always able to ensure that we have a secure connection.   I need to obtain your verbal consent now.   Are you willing to proceed with your visit today?   Laura [name] has provided verbal consent on 10/23/2021 for a virtual visit (video or telephone).   [name], [place] 10/23/2021  3:39 PM
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
## 2021-10-23 NOTE — Progress Notes (Signed)
   This service is provided via telemedicine  No vital signs collected/recorded due to the encounter was a telemedicine visit.   Location of patient (ex: home, work):  Home  Patient consents to a telephone visit: Yes, see telephone visit dated   Location of the provider (ex: office, home):  [place] [employer], [place]   Name of any referring provider:  N/A  Names of all persons participating in the telemedicine service and their role in the encounter:  [name], Abbey Chatters, NP, and Patient   Time spent on call:  9 min with medical assistant

## 2021-10-24 ENCOUNTER — Encounter: Payer: Medicare Other | Admitting: Nurse Practitioner

## 2021-10-24 ENCOUNTER — Other Ambulatory Visit: Payer: Self-pay

## 2021-10-24 DIAGNOSIS — E559 Vitamin D deficiency, unspecified: Secondary | ICD-10-CM

## 2021-10-24 DIAGNOSIS — B372 Candidiasis of skin and nail: Secondary | ICD-10-CM

## 2021-10-24 MED ORDER — NYSTATIN 100000 UNIT/GM EX POWD: 1.0000 | Freq: Three times a day (TID) | CUTANEOUS | 1 refills | Status: DC

## 2021-10-24 MED ORDER — VITAMIN D (ERGOCALCIFEROL) 1.25 MG (50000 UNIT) PO CAPS: 50000.0000 [IU] | ORAL_CAPSULE | ORAL | 0 refills | Status: DC

## 2021-10-24 MED ORDER — PANTOPRAZOLE SODIUM 40 MG PO TBEC: 40.0000 mg | DELAYED_RELEASE_TABLET | Freq: Every day | ORAL | 1 refills | Status: DC

## 2021-10-24 MED ORDER — ALBUTEROL SULFATE HFA 108 (90 BASE) MCG/ACT IN AERS: 2.0000 | INHALATION_SPRAY | Freq: Four times a day (QID) | RESPIRATORY_TRACT | 0 refills | Status: DC | PRN

## 2021-10-24 NOTE — Telephone Encounter (Signed)
Potassium CL SR MC TB is being requested but not on current medication list, should patient be on it? Dose?

## 2021-10-24 NOTE — Telephone Encounter (Signed)
[facility] sent request for furosemide TAB which is not on his current med list. Looks like a Dr. Janee Morn was previously prescribing it.   Also, need Albuterol HFA (Proair HFA) substitution (for insurance coverage) instructions, quantity and refills.   Albuterol (Ventolin HFA) 108 (90 base) should be discontinued.

## 2021-10-24 NOTE — Telephone Encounter (Signed)
Potassium was stopped with lasix.

## 2021-10-24 NOTE — Telephone Encounter (Signed)
Furosemide was discontinued.  New RX sent for proair

## 2021-11-21 ENCOUNTER — Other Ambulatory Visit: Payer: Self-pay | Admitting: *Deleted

## 2021-11-21 DIAGNOSIS — G8929 Other chronic pain: Secondary | ICD-10-CM

## 2021-11-21 MED ORDER — OXYCODONE-ACETAMINOPHEN 5-325 MG PO TABS: ORAL_TABLET | ORAL | 0 refills | Status: DC

## 2021-11-21 NOTE — Telephone Encounter (Signed)
Patient requested refill.  Epic LR: 10/23/2021 Contract Date: 01/03/2021 Pended Rx and sent to [facility] for approval.

## 2021-11-24 ENCOUNTER — Telehealth (INDEPENDENT_AMBULATORY_CARE_PROVIDER_SITE_OTHER): Payer: Medicare Other | Admitting: Nurse Practitioner

## 2021-11-24 ENCOUNTER — Other Ambulatory Visit: Payer: Self-pay | Admitting: Nurse Practitioner

## 2021-11-24 ENCOUNTER — Encounter: Payer: Self-pay | Admitting: Nurse Practitioner

## 2021-11-24 DIAGNOSIS — G629 Polyneuropathy, unspecified: Secondary | ICD-10-CM | POA: Diagnosis not present

## 2021-11-24 MED ORDER — DULOXETINE HCL 30 MG PO CPEP: 30.0000 mg | ORAL_CAPSULE | Freq: Every day | ORAL | 1 refills | Status: DC

## 2021-11-24 NOTE — Progress Notes (Signed)
Careteam: Patient Care Team: Sharon Seller, NP as PCP - General (Geriatric Medicine) Pricilla Riffle, MD as PCP - Cardiology (Cardiology)  Advanced Directive information    Allergies  Allergen Reactions   Garlic Anaphylaxis   Aspirin Hives   Lyrica [Pregabalin]     Nightmares, did not help neuropathy   Penicillins Hives    Chief Complaint  Patient presents with   Acute Visit    Discuss medication via telephone call     HPI: Patient is a 61 y.o. adult due to numbness in hands and feet.  Started during hospitalization and has progressively gotten worse.  Reports just in the tips of her fingers but in both feet.  Unable to use stationary bike due to the painful numbness.  Feet bothering him more than hands.  Having a hard time walking at this time.  Has used lyrica in the past and did not help.  Gabapentin making him sleepy but not effective.   She is taking b12, folate and vit d  Taking folic acid twice daily  Vit d weekly  B12 daily   Review of Systems:  Review of Systems  Constitutional:  Negative for chills and fever.  Musculoskeletal:  Positive for back pain and myalgias.  Neurological:  Positive for tingling and sensory change. Negative for dizziness and headaches.  Psychiatric/Behavioral:  Negative for depression. The patient is not nervous/anxious.     Past Medical History:  Diagnosis Date   Acute bronchitis    Cervical high risk HPV (human papillomavirus) test positive 12/2017   Negative subtype 16, 18/45   Chronic back pain    Complication of anesthesia    "didn't wake up"   Cough    Insomnia, unspecified    Other malaise and fatigue    Other specified diseases of blood and blood-forming organs(289.89)    Tobacco use disorder    Past Surgical History:  Procedure Laterality Date   BIOPSY  07/01/2021   Procedure: BIOPSY;  Surgeon: Jeani Hawking, MD;  Location: [facility] ENDOSCOPY;  Service: Gastroenterology;;  EGD and COLON   COLONOSCOPY WITH  PROPOFOL N/A 07/01/2021   Procedure: COLONOSCOPY WITH PROPOFOL;  Surgeon: Jeani Hawking, MD;  Location: [facility] ENDOSCOPY;  Service: Gastroenterology;  Laterality: N/A;   ESOPHAGOGASTRODUODENOSCOPY N/A 03/17/2013   Procedure: ESOPHAGOGASTRODUODENOSCOPY (EGD);  Surgeon: Meryl Dare, MD;  Location: [facility] ENDOSCOPY;  Service: Endoscopy;  Laterality: N/A;   ESOPHAGOGASTRODUODENOSCOPY (EGD) WITH PROPOFOL N/A 07/01/2021   Procedure: ESOPHAGOGASTRODUODENOSCOPY (EGD) WITH PROPOFOL;  Surgeon: Jeani Hawking, MD;  Location: [facility] ENDOSCOPY;  Service: Gastroenterology;  Laterality: N/A;   FRACTURE SURGERY     Laminectomy and foraminotomy at L5-S1    12/12/2019   TONSILLECTOMY     Social History:   reports that he has been smoking cigarettes. He has a 6.00 pack-year smoking history. He has never used smokeless tobacco. He reports that he does not currently use alcohol. He reports that he does not use drugs.  Family History  Problem Relation Age of Onset   Arthritis Mother    Hypertension Sister    Hypertension Brother    Hypertension Sister    Hypertension Sister    Hypertension Sister     Medications: Patient's Medications  New Prescriptions   No medications on file  Previous Medications   CIPROFLOXACIN (CILOXAN) 0.3 % OPHTHALMIC SOLUTION    Place 1 drop into both eyes every 2 (two) hours. Administer 1 drop, every 2 hours, while awake, for 2 days. Then 1 drop,  every 4 hours, while awake, for the next 5 days.   FERROUS SULFATE 325 (65 FE) MG TABLET    Take 325 mg by mouth 2 (two) times daily with a meal.   FOLIC ACID (FOLVITE) 1 MG TABLET    Take 1 tablet (1 mg total) by mouth daily.   GABAPENTIN (NEURONTIN) 100 MG CAPSULE    Take 1 capsule (100 mg total) by mouth 3 (three) times daily.   MAGNESIUM OXIDE (MAG-OX) 400 MG TABLET    Take 1 tablet (400 mg total) by mouth 2 (two) times daily.   METOPROLOL TARTRATE (LOPRESSOR) 25 MG TABLET    Take 0.5 tablets (12.5 mg total) by mouth 2 (two) times daily.    NYSTATIN POWDER    Apply 1 Application topically 3 (three) times daily.   OXYCODONE-ACETAMINOPHEN (PERCOCET/ROXICET) 5-325 MG TABLET    Take one tablet by mouth every 8-12 hours as needed for pain.   PANTOPRAZOLE (PROTONIX) 40 MG TABLET    Take 1 tablet (40 mg total) by mouth daily.   TETRAHYDROZOLINE HCL (VISINE OP)    Place 1 drop into both eyes daily as needed (For dry eyes).   VITAMIN D, ERGOCALCIFEROL, (DRISDOL) 1.25 MG (50000 UNIT) CAPS CAPSULE    Take 1 capsule (50,000 Units total) by mouth every 7 (seven) days.  Modified Medications   No medications on file  Discontinued Medications   No medications on file    Physical Exam:  There were no vitals filed for this visit. There is no height or weight on file to calculate BMI. Wt Readings from Last 3 Encounters:  09/24/21 154 lb 3.2 oz (69.9 kg)  08/04/21 169 lb 9.6 oz (76.9 kg)  06/29/21 189 lb 9.5 oz (86 kg)    Physical Exam Constitutional:      Appearance: Normal appearance.  Skin:    Findings: No lesion.  Neurological:     Mental Status: He is alert and oriented to person, place, and time. Mental status is at baseline.  Psychiatric:        Mood and Affect: Mood normal.     Labs reviewed: Basic Metabolic Panel: Recent Labs    06/30/21 0005 06/30/21 0435 06/30/21 0752 08/02/21 0352 08/03/21 0416 08/04/21 0234 08/15/21 1437 09/03/21 1410 09/29/21 1529  NA 137 136   < > 132* 131*   < > 138 142 139  K 2.8* 2.7*   < > 4.1 4.2   < > 4.6 2.8* 4.3  CL 98 100   < > 96* 96*   < > 104 94* 96*  CO2 28 28   < > 28 25   < > 24 34* 29  GLUCOSE 100* 104*   < > 80 74   < > 60* 92 76  BUN <5* <5*   < > 7 8   < > 7 5* 7  CREATININE 0.60 0.51   < > 0.54 0.64   < > 0.42* 0.40* 0.46*  CALCIUM 7.2* 7.2*   < > 8.2* 8.6*   < > 8.8 8.8 8.5*  MG 1.3*  --    < > 2.1 1.8  --  1.7  --   --   PHOS 2.4*  --   --   --  2.7  --   --   --   --   TSH  --  2.961  --   --   --   --   --   --   --    < > =  values in this interval not  displayed.   Liver Function Tests: Recent Labs    08/02/21 0352 08/03/21 0416 08/04/21 0234 08/15/21 1437 09/03/21 1410 09/29/21 1529  AST 42* 45* 34 34 27 42*  ALT 19 20 16 12 16 11   ALKPHOS 161* 190* 157*  --   --   --   BILITOT 5.9* 6.6* 4.2* 2.0* 2.0* 1.6*  PROT 5.0* 6.2* 5.2* 6.4 6.2 6.3  ALBUMIN 2.4* 2.7*  2.6* 2.2*  --   --   --    No results for input(s): "LIPASE", "AMYLASE" in the last 8760 hours. No results for input(s): "AMMONIA" in the last 8760 hours. CBC: Recent Labs    08/15/21 1437 09/03/21 1410 09/29/21 1529  WBC 6.9 8.1 4.7  NEUTROABS 4,865 6,934 3,469  HGB 9.4* 8.7* 9.2*  HCT 28.7* 25.8* 27.3*  MCV 98.0 100.8* 104.6*  PLT 342 112* 161   Lipid Panel: Recent Labs    01/03/21 1151  CHOL 140  HDL 80  LDLCALC 45  TRIG 70  CHOLHDL 1.8   TSH: Recent Labs    06/30/21 0435  TSH 2.961   A1C: Lab Results  Component Value Date   HGBA1C 4.8 07/01/2015     Assessment/Plan 1. Neuropathy -progressively worsening pain in bilateral feet and now finger tips -has upcoming appt and will update labs at this time as he has hx of folic acid, b12 deficiency -consider neurology referral but would like to try Cymbalta first  - DULoxetine (CYMBALTA) 30 MG capsule; Take 1 capsule (30 mg total) by mouth daily.  Dispense: 30 capsule; Refill: 1  Next appt: 12/05/2021 12/07/2021. Janene Harvey  [facility] [phone]    Virtual Visit via mychart video  I connected with patient on 11/24/21 at  2:40 PM EDT by mychart video and verified that I am speaking with the correct person using two identifiers.  Location: Patient: home Provider: psc   I discussed the limitations, risks, security and privacy concerns of performing an evaluation and management service by telephone and the availability of in person appointments. I also discussed with the patient that there may be a patient responsible charge related to this service. The  patient expressed understanding and agreed to proceed.   I discussed the assessment and treatment plan with the patient. The patient was provided an opportunity to ask questions and all were answered. The patient agreed with the plan and demonstrated an understanding of the instructions.   The patient was advised to call back or seek an in-person evaluation if the symptoms worsen or if the condition fails to improve as anticipated.  I provided 15 minutes of non-face-to-face time during this encounter.  01/24/22. Janene Harvey Avs printed and mailed

## 2021-11-24 NOTE — Progress Notes (Signed)
   This service is provided via telemedicine  No vital signs collected/recorded due to the encounter was a telemedicine visit.   Location of patient (ex: home, work):  Home  Patient consents to a telephone visit: Yes, see telephone visit dated 10/23/2021  Location of the provider (ex: office, home):  [facility], Office   Name of any referring provider:  N/A  Names of all persons participating in the telemedicine service and their role in the encounter:  [name], Abbey Chatters, NP, and Patient   Time spent on call:  7 min with medical assistant

## 2021-11-30 ENCOUNTER — Other Ambulatory Visit: Payer: Self-pay | Admitting: Nurse Practitioner

## 2021-11-30 DIAGNOSIS — I10 Essential (primary) hypertension: Secondary | ICD-10-CM

## 2021-12-01 ENCOUNTER — Ambulatory Visit: Payer: Medicare Other | Admitting: Nurse Practitioner

## 2021-12-04 ENCOUNTER — Other Ambulatory Visit: Payer: Self-pay | Admitting: Nurse Practitioner

## 2021-12-05 ENCOUNTER — Ambulatory Visit: Payer: Medicare Other | Admitting: Nurse Practitioner

## 2021-12-15 ENCOUNTER — Ambulatory Visit: Payer: Medicare Other | Admitting: Nurse Practitioner

## 2021-12-23 ENCOUNTER — Other Ambulatory Visit: Payer: Self-pay | Admitting: *Deleted

## 2021-12-23 DIAGNOSIS — G8929 Other chronic pain: Secondary | ICD-10-CM

## 2021-12-23 MED ORDER — OXYCODONE-ACETAMINOPHEN 5-325 MG PO TABS: ORAL_TABLET | ORAL | 0 refills | Status: DC

## 2021-12-23 NOTE — Telephone Encounter (Signed)
Patient requested refill.  Epic LR: 11/21/2021 Contract Date: 01/03/2021 Pended Rx and sent to [facility] for approval.

## 2021-12-27 ENCOUNTER — Emergency Department (HOSPITAL_COMMUNITY): Payer: Medicare Other

## 2021-12-27 ENCOUNTER — Inpatient Hospital Stay (HOSPITAL_COMMUNITY)
Admission: EM | Admit: 2021-12-27 | Discharge: 2022-01-22 | DRG: 432 | Disposition: A | Discharge status: Skilled Nursing Facility | Payer: Medicare Other | Attending: Internal Medicine | Admitting: Internal Medicine

## 2021-12-27 ENCOUNTER — Other Ambulatory Visit: Payer: Self-pay

## 2021-12-27 ENCOUNTER — Encounter (HOSPITAL_COMMUNITY): Payer: Self-pay

## 2021-12-27 DIAGNOSIS — E441 Mild protein-calorie malnutrition: Secondary | ICD-10-CM | POA: Diagnosis not present

## 2021-12-27 DIAGNOSIS — K567 Ileus, unspecified: Secondary | ICD-10-CM | POA: Diagnosis not present

## 2021-12-27 DIAGNOSIS — R000 Tachycardia, unspecified: Secondary | ICD-10-CM | POA: Diagnosis not present

## 2021-12-27 DIAGNOSIS — R160 Hepatomegaly, not elsewhere classified: Secondary | ICD-10-CM | POA: Diagnosis not present

## 2021-12-27 DIAGNOSIS — E870 Hyperosmolality and hypernatremia: Secondary | ICD-10-CM | POA: Diagnosis not present

## 2021-12-27 DIAGNOSIS — I959 Hypotension, unspecified: Secondary | ICD-10-CM | POA: Diagnosis present

## 2021-12-27 DIAGNOSIS — Z7189 Other specified counseling: Secondary | ICD-10-CM | POA: Diagnosis not present

## 2021-12-27 DIAGNOSIS — Z880 Allergy status to penicillin: Secondary | ICD-10-CM

## 2021-12-27 DIAGNOSIS — I471 Supraventricular tachycardia, unspecified: Secondary | ICD-10-CM | POA: Diagnosis present

## 2021-12-27 DIAGNOSIS — F1011 Alcohol abuse, in remission: Secondary | ICD-10-CM | POA: Diagnosis present

## 2021-12-27 DIAGNOSIS — Y92039 Unspecified place in apartment as the place of occurrence of the external cause: Secondary | ICD-10-CM | POA: Diagnosis not present

## 2021-12-27 DIAGNOSIS — K767 Hepatorenal syndrome: Secondary | ICD-10-CM | POA: Diagnosis present

## 2021-12-27 DIAGNOSIS — K7682 Hepatic encephalopathy: Secondary | ICD-10-CM | POA: Diagnosis present

## 2021-12-27 DIAGNOSIS — K7031 Alcoholic cirrhosis of liver with ascites: Secondary | ICD-10-CM | POA: Diagnosis present

## 2021-12-27 DIAGNOSIS — R6521 Severe sepsis with septic shock: Secondary | ICD-10-CM | POA: Diagnosis not present

## 2021-12-27 DIAGNOSIS — N179 Acute kidney failure, unspecified: Secondary | ICD-10-CM

## 2021-12-27 DIAGNOSIS — R053 Chronic cough: Secondary | ICD-10-CM | POA: Diagnosis not present

## 2021-12-27 DIAGNOSIS — K219 Gastro-esophageal reflux disease without esophagitis: Secondary | ICD-10-CM | POA: Diagnosis not present

## 2021-12-27 DIAGNOSIS — R627 Adult failure to thrive: Secondary | ICD-10-CM | POA: Diagnosis not present

## 2021-12-27 DIAGNOSIS — M549 Dorsalgia, unspecified: Secondary | ICD-10-CM | POA: Diagnosis present

## 2021-12-27 DIAGNOSIS — D684 Acquired coagulation factor deficiency: Secondary | ICD-10-CM | POA: Diagnosis not present

## 2021-12-27 DIAGNOSIS — D638 Anemia in other chronic diseases classified elsewhere: Secondary | ICD-10-CM | POA: Diagnosis present

## 2021-12-27 DIAGNOSIS — N390 Urinary tract infection, site not specified: Secondary | ICD-10-CM | POA: Diagnosis present

## 2021-12-27 DIAGNOSIS — G928 Other toxic encephalopathy: Secondary | ICD-10-CM | POA: Diagnosis not present

## 2021-12-27 DIAGNOSIS — M6281 Muscle weakness (generalized): Secondary | ICD-10-CM | POA: Diagnosis not present

## 2021-12-27 DIAGNOSIS — R531 Weakness: Secondary | ICD-10-CM | POA: Diagnosis not present

## 2021-12-27 DIAGNOSIS — K7010 Alcoholic hepatitis without ascites: Secondary | ICD-10-CM | POA: Diagnosis not present

## 2021-12-27 DIAGNOSIS — E872 Acidosis, unspecified: Secondary | ICD-10-CM | POA: Diagnosis not present

## 2021-12-27 DIAGNOSIS — L8990 Pressure ulcer of unspecified site, unspecified stage: Secondary | ICD-10-CM | POA: Insufficient documentation

## 2021-12-27 DIAGNOSIS — Z1152 Encounter for screening for COVID-19: Secondary | ICD-10-CM | POA: Diagnosis not present

## 2021-12-27 DIAGNOSIS — R1310 Dysphagia, unspecified: Secondary | ICD-10-CM | POA: Diagnosis present

## 2021-12-27 DIAGNOSIS — Z888 Allergy status to other drugs, medicaments and biological substances status: Secondary | ICD-10-CM

## 2021-12-27 DIAGNOSIS — R1110 Vomiting, unspecified: Secondary | ICD-10-CM | POA: Diagnosis not present

## 2021-12-27 DIAGNOSIS — W1830XA Fall on same level, unspecified, initial encounter: Secondary | ICD-10-CM | POA: Diagnosis present

## 2021-12-27 DIAGNOSIS — Z515 Encounter for palliative care: Secondary | ICD-10-CM | POA: Diagnosis not present

## 2021-12-27 DIAGNOSIS — G8929 Other chronic pain: Secondary | ICD-10-CM | POA: Diagnosis present

## 2021-12-27 DIAGNOSIS — Z79899 Other long term (current) drug therapy: Secondary | ICD-10-CM

## 2021-12-27 DIAGNOSIS — K7581 Nonalcoholic steatohepatitis (NASH): Secondary | ICD-10-CM | POA: Diagnosis present

## 2021-12-27 DIAGNOSIS — K7290 Hepatic failure, unspecified without coma: Secondary | ICD-10-CM | POA: Diagnosis present

## 2021-12-27 DIAGNOSIS — L89226 Pressure-induced deep tissue damage of left hip: Secondary | ICD-10-CM | POA: Diagnosis not present

## 2021-12-27 DIAGNOSIS — R1011 Right upper quadrant pain: Secondary | ICD-10-CM | POA: Diagnosis not present

## 2021-12-27 DIAGNOSIS — G4700 Insomnia, unspecified: Secondary | ICD-10-CM | POA: Diagnosis present

## 2021-12-27 DIAGNOSIS — E871 Hypo-osmolality and hyponatremia: Secondary | ICD-10-CM | POA: Diagnosis not present

## 2021-12-27 DIAGNOSIS — E512 Wernicke's encephalopathy: Secondary | ICD-10-CM | POA: Diagnosis present

## 2021-12-27 DIAGNOSIS — D65 Disseminated intravascular coagulation [defibrination syndrome]: Secondary | ICD-10-CM | POA: Diagnosis not present

## 2021-12-27 DIAGNOSIS — R5383 Other fatigue: Secondary | ICD-10-CM | POA: Diagnosis not present

## 2021-12-27 DIAGNOSIS — R0602 Shortness of breath: Secondary | ICD-10-CM | POA: Diagnosis not present

## 2021-12-27 DIAGNOSIS — Z800 Family history of malignant neoplasm of digestive organs: Secondary | ICD-10-CM

## 2021-12-27 DIAGNOSIS — J9601 Acute respiratory failure with hypoxia: Secondary | ICD-10-CM | POA: Diagnosis not present

## 2021-12-27 DIAGNOSIS — J9811 Atelectasis: Secondary | ICD-10-CM | POA: Diagnosis not present

## 2021-12-27 DIAGNOSIS — G9340 Encephalopathy, unspecified: Secondary | ICD-10-CM | POA: Diagnosis not present

## 2021-12-27 DIAGNOSIS — R0902 Hypoxemia: Secondary | ICD-10-CM | POA: Diagnosis not present

## 2021-12-27 DIAGNOSIS — R2689 Other abnormalities of gait and mobility: Secondary | ICD-10-CM | POA: Diagnosis not present

## 2021-12-27 DIAGNOSIS — R195 Other fecal abnormalities: Secondary | ICD-10-CM | POA: Diagnosis present

## 2021-12-27 DIAGNOSIS — R32 Unspecified urinary incontinence: Secondary | ICD-10-CM | POA: Diagnosis present

## 2021-12-27 DIAGNOSIS — Z743 Need for continuous supervision: Secondary | ICD-10-CM | POA: Diagnosis not present

## 2021-12-27 DIAGNOSIS — E785 Hyperlipidemia, unspecified: Secondary | ICD-10-CM | POA: Diagnosis present

## 2021-12-27 DIAGNOSIS — Z4682 Encounter for fitting and adjustment of non-vascular catheter: Secondary | ICD-10-CM | POA: Diagnosis not present

## 2021-12-27 DIAGNOSIS — K7030 Alcoholic cirrhosis of liver without ascites: Secondary | ICD-10-CM

## 2021-12-27 DIAGNOSIS — R262 Difficulty in walking, not elsewhere classified: Secondary | ICD-10-CM | POA: Diagnosis not present

## 2021-12-27 DIAGNOSIS — G319 Degenerative disease of nervous system, unspecified: Secondary | ICD-10-CM | POA: Diagnosis not present

## 2021-12-27 DIAGNOSIS — Z95828 Presence of other vascular implants and grafts: Secondary | ICD-10-CM | POA: Diagnosis not present

## 2021-12-27 DIAGNOSIS — L89209 Pressure ulcer of unspecified hip, unspecified stage: Secondary | ICD-10-CM | POA: Diagnosis not present

## 2021-12-27 DIAGNOSIS — F17210 Nicotine dependence, cigarettes, uncomplicated: Secondary | ICD-10-CM | POA: Diagnosis present

## 2021-12-27 DIAGNOSIS — R4182 Altered mental status, unspecified: Secondary | ICD-10-CM | POA: Diagnosis not present

## 2021-12-27 DIAGNOSIS — T361X5A Adverse effect of cephalosporins and other beta-lactam antibiotics, initial encounter: Secondary | ICD-10-CM | POA: Diagnosis not present

## 2021-12-27 DIAGNOSIS — K7011 Alcoholic hepatitis with ascites: Secondary | ICD-10-CM | POA: Diagnosis present

## 2021-12-27 DIAGNOSIS — D696 Thrombocytopenia, unspecified: Secondary | ICD-10-CM | POA: Diagnosis not present

## 2021-12-27 DIAGNOSIS — R188 Other ascites: Secondary | ICD-10-CM | POA: Diagnosis not present

## 2021-12-27 DIAGNOSIS — Z6821 Body mass index (BMI) 21.0-21.9, adult: Secondary | ICD-10-CM

## 2021-12-27 DIAGNOSIS — K6389 Other specified diseases of intestine: Secondary | ICD-10-CM | POA: Diagnosis not present

## 2021-12-27 DIAGNOSIS — Z886 Allergy status to analgesic agent status: Secondary | ICD-10-CM

## 2021-12-27 DIAGNOSIS — E162 Hypoglycemia, unspecified: Secondary | ICD-10-CM | POA: Diagnosis not present

## 2021-12-27 DIAGNOSIS — Z8249 Family history of ischemic heart disease and other diseases of the circulatory system: Secondary | ICD-10-CM

## 2021-12-27 DIAGNOSIS — D649 Anemia, unspecified: Principal | ICD-10-CM

## 2021-12-27 DIAGNOSIS — M6282 Rhabdomyolysis: Secondary | ICD-10-CM | POA: Diagnosis not present

## 2021-12-27 DIAGNOSIS — D61818 Other pancytopenia: Secondary | ICD-10-CM | POA: Diagnosis not present

## 2021-12-27 DIAGNOSIS — I499 Cardiac arrhythmia, unspecified: Secondary | ICD-10-CM | POA: Diagnosis not present

## 2021-12-27 DIAGNOSIS — K8020 Calculus of gallbladder without cholecystitis without obstruction: Secondary | ICD-10-CM | POA: Diagnosis present

## 2021-12-27 DIAGNOSIS — S7002XA Contusion of left hip, initial encounter: Secondary | ICD-10-CM | POA: Diagnosis present

## 2021-12-27 DIAGNOSIS — L89316 Pressure-induced deep tissue damage of right buttock: Secondary | ICD-10-CM | POA: Diagnosis present

## 2021-12-27 DIAGNOSIS — N170 Acute kidney failure with tubular necrosis: Secondary | ICD-10-CM | POA: Diagnosis not present

## 2021-12-27 DIAGNOSIS — R34 Anuria and oliguria: Secondary | ICD-10-CM | POA: Diagnosis not present

## 2021-12-27 DIAGNOSIS — H109 Unspecified conjunctivitis: Secondary | ICD-10-CM | POA: Diagnosis present

## 2021-12-27 DIAGNOSIS — E8770 Fluid overload, unspecified: Secondary | ICD-10-CM | POA: Diagnosis not present

## 2021-12-27 DIAGNOSIS — K760 Fatty (change of) liver, not elsewhere classified: Secondary | ICD-10-CM | POA: Diagnosis not present

## 2021-12-27 DIAGNOSIS — R279 Unspecified lack of coordination: Secondary | ICD-10-CM | POA: Diagnosis not present

## 2021-12-27 DIAGNOSIS — A419 Sepsis, unspecified organism: Secondary | ICD-10-CM | POA: Diagnosis not present

## 2021-12-27 DIAGNOSIS — K922 Gastrointestinal hemorrhage, unspecified: Secondary | ICD-10-CM | POA: Diagnosis not present

## 2021-12-27 DIAGNOSIS — Z7401 Bed confinement status: Secondary | ICD-10-CM | POA: Diagnosis not present

## 2021-12-27 DIAGNOSIS — D508 Other iron deficiency anemias: Secondary | ICD-10-CM | POA: Diagnosis not present

## 2021-12-27 DIAGNOSIS — E869 Volume depletion, unspecified: Secondary | ICD-10-CM | POA: Diagnosis present

## 2021-12-27 DIAGNOSIS — E876 Hypokalemia: Secondary | ICD-10-CM | POA: Diagnosis present

## 2021-12-27 DIAGNOSIS — F32A Depression, unspecified: Secondary | ICD-10-CM | POA: Diagnosis not present

## 2021-12-27 DIAGNOSIS — E7220 Disorder of urea cycle metabolism, unspecified: Secondary | ICD-10-CM | POA: Diagnosis not present

## 2021-12-27 DIAGNOSIS — Z8261 Family history of arthritis: Secondary | ICD-10-CM

## 2021-12-27 DIAGNOSIS — L89326 Pressure-induced deep tissue damage of left buttock: Secondary | ICD-10-CM | POA: Diagnosis present

## 2021-12-27 DIAGNOSIS — R6889 Other general symptoms and signs: Secondary | ICD-10-CM | POA: Diagnosis not present

## 2021-12-27 DIAGNOSIS — S30814A Abrasion of vagina and vulva, initial encounter: Secondary | ICD-10-CM | POA: Diagnosis present

## 2021-12-27 DIAGNOSIS — I10 Essential (primary) hypertension: Secondary | ICD-10-CM | POA: Diagnosis present

## 2021-12-27 LAB — MAGNESIUM: Magnesium: 1.4 mg/dL — ABNORMAL LOW (ref 1.7–2.4)

## 2021-12-27 LAB — CBC WITH DIFFERENTIAL/PLATELET
Abs Immature Granulocytes: 0.06 10*3/uL (ref 0.00–0.07)
Basophils Absolute: 0 10*3/uL (ref 0.0–0.1)
Basophils Relative: 0 %
Eosinophils Absolute: 0 10*3/uL (ref 0.0–0.5)
Eosinophils Relative: 0 %
HCT: 15.5 % — ABNORMAL LOW (ref 36.0–46.0)
Hemoglobin: 5 g/dL — CL (ref 12.0–15.0)
Immature Granulocytes: 1 %
Lymphocytes Relative: 6 %
Lymphs Abs: 0.4 10*3/uL — ABNORMAL LOW (ref 0.7–4.0)
MCH: 45.5 pg — ABNORMAL HIGH (ref 26.0–34.0)
MCHC: 32.3 g/dL (ref 30.0–36.0)
MCV: 140.9 fL — ABNORMAL HIGH (ref 80.0–100.0)
Monocytes Absolute: 0.2 10*3/uL (ref 0.1–1.0)
Monocytes Relative: 4 %
Neutro Abs: 4.9 10*3/uL (ref 1.7–7.7)
Neutrophils Relative %: 89 %
Platelets: 77 10*3/uL — ABNORMAL LOW (ref 150–400)
RBC: 1.1 MIL/uL — ABNORMAL LOW (ref 3.87–5.11)
RDW: 19.7 % — ABNORMAL HIGH (ref 11.5–15.5)
WBC: 5.5 10*3/uL (ref 4.0–10.5)
nRBC: 1.1 % — ABNORMAL HIGH (ref 0.0–0.2)

## 2021-12-27 LAB — AMMONIA: Ammonia: 60 umol/L — ABNORMAL HIGH (ref 9–35)

## 2021-12-27 LAB — I-STAT CHEM 8, ED
BUN: 29 mg/dL — ABNORMAL HIGH (ref 6–20)
Calcium, Ion: 1.01 mmol/L — ABNORMAL LOW (ref 1.15–1.40)
Chloride: 96 mmol/L — ABNORMAL LOW (ref 98–111)
Creatinine, Ser: 1.9 mg/dL — ABNORMAL HIGH (ref 0.44–1.00)
Glucose, Bld: 75 mg/dL (ref 70–99)
HCT: 16 % — ABNORMAL LOW (ref 36.0–46.0)
Hemoglobin: 5.4 g/dL — CL (ref 12.0–15.0)
Potassium: 3.1 mmol/L — ABNORMAL LOW (ref 3.5–5.1)
Sodium: 136 mmol/L (ref 135–145)
TCO2: 24 mmol/L (ref 22–32)

## 2021-12-27 LAB — COMPREHENSIVE METABOLIC PANEL
ALT: 36 U/L (ref 0–44)
AST: 99 U/L — ABNORMAL HIGH (ref 15–41)
Albumin: 2.3 g/dL — ABNORMAL LOW (ref 3.5–5.0)
Alkaline Phosphatase: 114 U/L (ref 38–126)
Anion gap: 20 — ABNORMAL HIGH (ref 5–15)
BUN: 31 mg/dL — ABNORMAL HIGH (ref 6–20)
CO2: 22 mmol/L (ref 22–32)
Calcium: 8.5 mg/dL — ABNORMAL LOW (ref 8.9–10.3)
Chloride: 95 mmol/L — ABNORMAL LOW (ref 98–111)
Creatinine, Ser: 2.14 mg/dL — ABNORMAL HIGH (ref 0.44–1.00)
GFR, Estimated: 26 mL/min — ABNORMAL LOW (ref >60)
Glucose, Bld: 82 mg/dL (ref 70–99)
Potassium: 3 mmol/L — ABNORMAL LOW (ref 3.5–5.1)
Sodium: 137 mmol/L (ref 135–145)
Total Bilirubin: 11.8 mg/dL — ABNORMAL HIGH (ref 0.3–1.2)
Total Protein: 5.7 g/dL — ABNORMAL LOW (ref 6.5–8.1)

## 2021-12-27 LAB — PROTIME-INR
INR: 1.5 — ABNORMAL HIGH (ref 0.8–1.2)
Prothrombin Time: 18.1 seconds — ABNORMAL HIGH (ref 11.4–15.2)

## 2021-12-27 LAB — TSH: TSH: 2.871 u[IU]/mL (ref 0.350–4.500)

## 2021-12-27 LAB — TROPONIN I (HIGH SENSITIVITY)
Troponin I (High Sensitivity): 35 ng/L — ABNORMAL HIGH (ref <18)
Troponin I (High Sensitivity): 26 ng/L — ABNORMAL HIGH (ref <18)

## 2021-12-27 LAB — GLUCOSE, CAPILLARY
Glucose-Capillary: 131 mg/dL — ABNORMAL HIGH (ref 70–99)
Glucose-Capillary: 143 mg/dL — ABNORMAL HIGH (ref 70–99)

## 2021-12-27 LAB — APTT: aPTT: 43 seconds — ABNORMAL HIGH (ref 24–36)

## 2021-12-27 LAB — I-STAT BETA HCG BLOOD, ED (MC, WL, AP ONLY): I-stat hCG, quantitative: <5 m[IU]/mL (ref <5)

## 2021-12-27 LAB — PREPARE RBC (CROSSMATCH)

## 2021-12-27 LAB — SARS CORONAVIRUS 2 BY RT PCR: SARS Coronavirus 2 by RT PCR: NEGATIVE

## 2021-12-27 LAB — POC OCCULT BLOOD, ED: Fecal Occult Bld: POSITIVE — AB

## 2021-12-27 LAB — LACTIC ACID, PLASMA
Lactic Acid, Venous: 4 mmol/L (ref 0.5–1.9)
Lactic Acid, Venous: 4.9 mmol/L (ref 0.5–1.9)

## 2021-12-27 LAB — CK: Total CK: 488 U/L — ABNORMAL HIGH (ref 38–234)

## 2021-12-27 MED ORDER — SODIUM CHLORIDE 0.9 % IV SOLN
1.0000 mg | Freq: Once | INTRAVENOUS | Status: AC
  Administered 2021-12-27: 1 mg via INTRAVENOUS
  Filled 2021-12-27: qty 0.2

## 2021-12-27 MED ORDER — THIAMINE HCL 100 MG/ML IJ SOLN
100.0000 mg | Freq: Every day | INTRAMUSCULAR | Status: DC
  Administered 2021-12-27 – 2021-12-30 (×4): 100 mg via INTRAVENOUS
  Filled 2021-12-27 (×4): qty 2

## 2021-12-27 MED ORDER — SODIUM CHLORIDE 0.9 % IV BOLUS
500.0000 mL | Freq: Once | INTRAVENOUS | Status: AC
  Administered 2021-12-27: 500 mL via INTRAVENOUS

## 2021-12-27 MED ORDER — MAGNESIUM SULFATE 2 GM/50ML IV SOLN
2.0000 g | Freq: Once | INTRAVENOUS | Status: AC
  Administered 2021-12-27: 2 g via INTRAVENOUS
  Filled 2021-12-27: qty 50

## 2021-12-27 MED ORDER — PANTOPRAZOLE SODIUM 40 MG IV SOLR
40.0000 mg | Freq: Two times a day (BID) | INTRAVENOUS | Status: DC
  Administered 2021-12-27: 40 mg via INTRAVENOUS
  Filled 2021-12-27: qty 10

## 2021-12-27 MED ORDER — CIPROFLOXACIN HCL 0.3 % OP SOLN
1.0000 [drp] | OPHTHALMIC | Status: AC
Start: 2021-12-27 — End: 2022-01-01
  Administered 2021-12-27 – 2022-01-01 (×23): 1 [drp] via OPHTHALMIC
  Filled 2021-12-27 (×2): qty 2.5

## 2021-12-27 MED ORDER — LACTULOSE ENEMA
300.0000 mL | Freq: Once | ORAL | Status: DC
  Filled 2021-12-27: qty 300

## 2021-12-27 MED ORDER — HEPARIN SODIUM (PORCINE) 5000 UNIT/ML IJ SOLN: 5000.0000 [IU] | Freq: Three times a day (TID) | INTRAMUSCULAR | Status: DC

## 2021-12-27 MED ORDER — AMIODARONE IV BOLUS ONLY 150 MG/100ML
150.0000 mg | Freq: Once | INTRAVENOUS | Status: AC
  Administered 2021-12-27: 150 mg via INTRAVENOUS
  Filled 2021-12-27: qty 100

## 2021-12-27 MED ORDER — DOCUSATE SODIUM 100 MG PO CAPS: 100.0000 mg | ORAL_CAPSULE | Freq: Two times a day (BID) | ORAL | Status: DC | PRN

## 2021-12-27 MED ORDER — LACTATED RINGERS IV BOLUS
1000.0000 mL | Freq: Once | INTRAVENOUS | Status: AC
  Administered 2021-12-27: 1000 mL via INTRAVENOUS

## 2021-12-27 MED ORDER — NOREPINEPHRINE 4 MG/250ML-% IV SOLN
2.0000 ug/min | INTRAVENOUS | Status: DC
  Administered 2021-12-28: 8 ug/min via INTRAVENOUS
  Administered 2021-12-28: 9 ug/min via INTRAVENOUS
  Administered 2021-12-29: 7 ug/min via INTRAVENOUS
  Administered 2021-12-29: 5 ug/min via INTRAVENOUS
  Administered 2021-12-29: 3 ug/min via INTRAVENOUS
  Administered 2022-01-01: 2 ug/min via INTRAVENOUS
  Administered 2022-01-02: 4 ug/min via INTRAVENOUS
  Administered 2022-01-03 – 2022-01-04 (×2): 2 ug/min via INTRAVENOUS
  Filled 2021-12-27 (×8): qty 250

## 2021-12-27 MED ORDER — SODIUM CHLORIDE 0.9 % IV SOLN: INTRAVENOUS | Status: DC

## 2021-12-27 MED ORDER — CALCIUM GLUCONATE-NACL 1-0.675 GM/50ML-% IV SOLN
1.0000 g | Freq: Once | INTRAVENOUS | Status: AC
  Administered 2021-12-27: 1000 mg via INTRAVENOUS
  Filled 2021-12-27: qty 50

## 2021-12-27 MED ORDER — NOREPINEPHRINE 4 MG/250ML-% IV SOLN
INTRAVENOUS | Status: AC
  Administered 2021-12-27: 2 ug/min via INTRAVENOUS
  Filled 2021-12-27: qty 250

## 2021-12-27 MED ORDER — POLYETHYLENE GLYCOL 3350 17 G PO PACK: 17.0000 g | PACK | Freq: Every day | ORAL | Status: DC | PRN

## 2021-12-27 MED ORDER — SODIUM CHLORIDE 0.9 % IV SOLN
2.0000 g | Freq: Two times a day (BID) | INTRAVENOUS | Status: DC
  Administered 2021-12-27 – 2021-12-31 (×8): 2 g via INTRAVENOUS
  Filled 2021-12-27 (×8): qty 12.5

## 2021-12-27 MED ORDER — VANCOMYCIN HCL 1500 MG/300ML IV SOLN
1500.0000 mg | Freq: Once | INTRAVENOUS | Status: AC
  Administered 2021-12-27: 1500 mg via INTRAVENOUS
  Filled 2021-12-27: qty 300

## 2021-12-27 MED ORDER — VANCOMYCIN VARIABLE DOSE PER UNSTABLE RENAL FUNCTION (PHARMACIST DOSING): Status: DC

## 2021-12-27 MED ORDER — NOREPINEPHRINE 4 MG/250ML-% IV SOLN
0.0000 ug/min | INTRAVENOUS | Status: DC
  Administered 2021-12-27: 2 ug/min via INTRAVENOUS

## 2021-12-27 MED ORDER — SODIUM CHLORIDE 0.9 % IV SOLN
10.0000 mL/h | Freq: Once | INTRAVENOUS | Status: AC
  Administered 2021-12-27: 10 mL/h via INTRAVENOUS

## 2021-12-27 MED ORDER — POTASSIUM CHLORIDE 10 MEQ/100ML IV SOLN
10.0000 meq | INTRAVENOUS | Status: AC
  Administered 2021-12-27 – 2021-12-28 (×6): 10 meq via INTRAVENOUS
  Filled 2021-12-27 (×6): qty 100

## 2021-12-27 MED ORDER — SODIUM CHLORIDE 0.9 % IV SOLN
250.0000 mL | INTRAVENOUS | Status: DC
  Administered 2021-12-30 – 2021-12-31 (×2): 250 mL via INTRAVENOUS
  Administered 2022-01-01: 10 mL via INTRAVENOUS
  Administered 2022-01-03 – 2022-01-04 (×2): 250 mL via INTRAVENOUS

## 2021-12-27 NOTE — Progress Notes (Signed)
An USGPIV (ultrasound guided PIV) has been placed for short-term vasopressor infusion. A correctly placed ivWatch must be used when administering Vasopressors. Should this treatment be needed beyond 72 hours, central line access should be obtained.  It will be the responsibility of the bedside nurse to follow best practice to prevent extravasations.   ?

## 2021-12-27 NOTE — ED Notes (Signed)
Patient dropped to 64/43, HR-106 and RR-12, Spo2-89% on 2L [place] O2, unresponsive, notified ED provider, levophed ordered.Patient placed to non-rebreather.

## 2021-12-27 NOTE — Progress Notes (Signed)
Pharmacy Antibiotic Note  Laura [name] is a 61 y.o. adult for which pharmacy has been consulted for cefepime and vancomycin dosing for sepsis.  Patient with a history of chronic back pain, HTN, HLD, hypokalemia, anemia, transaminitis. Patient presenting after being found unresponsive.   Cipro eye drops ordered by CCM for conjunctivitis.  SCr 1.9 - above baseline WBC 5.5; LA 4.9; T 97.5; HR 102; RR 19 CK 488  Plan: Cefepime 2g q12hr Vancomycin 1500 mg once, subsequent dosing as indicated per random vancomycin level until renal function stable and/or improved, at which time scheduled dosing can be considered Trend WBC, Fever, Renal function F/u cultures, clinical course, WBC, fever De-escalate when able  Weight: 68 kg (149 lb 14.6 oz)  Temp (24hrs), Avg:97.5 F (36.4 C), Min:97.5 F (36.4 C), Max:97.5 F (36.4 C)  Recent Labs  Lab 12/27/21 1649 12/27/21 1650 12/27/21 1751  WBC  --  5.5  --   CREATININE  --  2.14* 1.90*  LATICACIDVEN 4.0*  --   --     Estimated Creatinine Clearance (by C-G formula based on SCr of 1.9 mg/dL (H)) Female: 31 mL/min (A) Female: 39.1 mL/min (A)    Allergies  Allergen Reactions   Garlic Anaphylaxis   Aspirin Hives   Lyrica [Pregabalin]     Nightmares, did not help neuropathy   Penicillins Hives    Antimicrobials this admission: cefepime 9/9 >>  vancomycin 9/9 >>  Microbiology results: Pending  Thank you for allowing pharmacy to be a part of this patient's care.  Delmar Landau, PharmD, BCPS 12/27/2021 7:21 PM ED Clinical Pharmacist -  [phone]

## 2021-12-27 NOTE — H&P (Addendum)
NAME:  Laura [name], MRN:  [id number], DOB:  17-Feb-1961, LOS: 0 ADMISSION DATE:  12/27/2021, CONSULTATION DATE:  12/27/21 REFERRING MD:  Dr. Myrlene Broker / EDP, CHIEF COMPLAINT:  Fall    History of Present Illness:  61 y/o patient who presented to [facility] ER on 9/9 via EMS with reports of weakness and fall.    The patient was admitted in April 2023 with symptomatic anemia, hypotension, SVT, electrolyte disturbances, UTI & conjunctivitis.  He was seen by Hematology during that admission and his anemia was felt to be related to anemia of chronic disease in the setting of liver disease. He was also seen by GI and was planned to follow up with GI as outpatient but it does not appear he was able to follow up.    Laura [name] identifies as female, lives in an apartment.  His cousin assists in his medical care at home.  He reportedly has had progressive weakness.  He attempted to go to the bathroom and fell.  He apparently was on the floor for at least a day.  EMS was activated and found the patient to be hypotensive with SBP in 60's and in SVT.  He was apparently difficult to stick for an IV. The patient reports he continues to smoke 1/2ppd, quit drinking approximately 7 months ago. Per his cousin, he has had episodes of incontinence with moisture associated rash on peri area.  In ER, the patient required O2 to maintain saturations.    Initial ER evaluation notable for recurrent episodes of SVT, hypotension.  Labs -Na 136, K 3.1, Cl 96, glucose 75, BUN 29, Cr 2.14, calcium 8.5, AG 20, albumin 2.3, AST 99 / ALT 36, ammonia 60, TSH 2.8, lactic acid 4, WBC 5.5, Hgb 5, Hct 15.5, MCV 140, platelets 77 & INR 1.5.  CXR negative. COVID negative. Blood & urine cultures pending.   PCCM called for ICU admission.    Pertinent  Medical History  Chronic Back Pain  Tobacco Abuse  ETOH Abuse - reportedly quit Feb 2023 Insomnia  Laminectomy   Significant Hospital Events: Including procedures, antibiotic start and stop dates in  addition to other pertinent events   9/9 Admit s/p mechanical fall   Interim History / Subjective:  As above  Objective   Blood pressure (!) 71/54, pulse (!) 155, temperature (!) 97.5 F (36.4 C), temperature source Rectal, resp. rate (!) 22, SpO2 (!) 81 %.       No intake or output data in the 24 hours ending 12/27/21 1907 There were no vitals filed for this visit.  Examination: General: chronically critically ill adult lying in bed in NAD HENT: MM pink/dry, yellow drainage from bilateral eyes, scleral icterus  Lungs: non-labored at rest, lungs bilaterally clear with good air entry  Cardiovascular: s1s2 RRR, no m/r/g, intermittent SVT on tele in 150's Abdomen: protuberant abd, bsx4 hypoactive  Extremities: warm/dry, no overt edema  Neuro: Awake, alert, answers questions, difficult to understand due to dry mouth, MAE but generalized weakness   Resolved Hospital Problem list     Assessment & Plan:   Symptomatic Anemia  Anemia of Chronic Disease  Coagulopathy  Similar admission in April 2023, felt to be related to chronic disease / liver.  INR 1.5 on admission.  -admit to ICU  -transfuse 2 units now  -may need central access for adequate resuscitation  -may need pressors if not responsive to IVF / PRBC -assess FOBT  -PPI -thiamine, folate IV  SVT  Hypotension  -amiodarone 150  mg IV bolus x1 -anticipate will correct with correction of electrolyte abnormalities / anemia  -tele monitoring  -resuscitation with PRBC's -goal K+>4, Mg+ >2 with SVT  -vasopressors for MAP >65 but needs simultaneous PRBC resuscitation  -assess troponin, EKG, lactate  SIRS, R/o Sepsis  No clear source on admission. Per [place], only trace ascitic fluid on exam   -assess pan cultures -empiric abx with cefepime, vancomycin   AKI  Mild Rhabdomyolysis  In setting of volume depletion, downtime of ~ 1 day -Trend BMP / urinary output -follow CK  -Replace electrolytes as indicated -Avoid  nephrotoxic agents, ensure adequate renal perfusion  Hypokalemia, Hypomagnesemia  -2gm Mg+ & 60 mEq KCL    ETOH Liver Disease  Elevated Bilirubin  Suspect this is end stage liver disease  -assess ABD [place], doppler   Conjunctivitis  -cipro eye drops   Adult Failure to Thrive  -Palliative Care Consult  -Pt request full code on admission.  Feel he has poor insight into his medical care.  He states if he could not make decisions for himself, he would want [name] [phone] to make decisions for him and his goddaughter   Best Practice (right click and "Reselect all SmartList Selections" daily)  Diet/type: Regular consistency (see orders) DVT prophylaxis: SCD GI prophylaxis: PPI Lines: N/A Foley:  N/A Code Status:  full code Last date of multidisciplinary goals of care discussion: 9/9, confirmed full code  Labs   CBC: Recent Labs  Lab 12/27/21 1650 12/27/21 1751  WBC 5.5  --   NEUTROABS 4.9  --   HGB 5.0* 5.4*  HCT 15.5* 16.0*  MCV 140.9*  --   PLT 77*  --     Basic Metabolic Panel: Recent Labs  Lab 12/27/21 1650 12/27/21 1751  NA 137 136  K 3.0* 3.1*  CL 95* 96*  CO2 22  --   GLUCOSE 82 75  BUN 31* 29*  CREATININE 2.14* 1.90*  CALCIUM 8.5*  --    GFR: CrCl cannot be calculated (Unknown ideal weight.). Recent Labs  Lab 12/27/21 1649 12/27/21 1650  WBC  --  5.5  LATICACIDVEN 4.0*  --     Liver Function Tests: Recent Labs  Lab 12/27/21 1650  AST 99*  ALT 36  ALKPHOS 114  BILITOT 11.8*  PROT 5.7*  ALBUMIN 2.3*   No results for input(s): "LIPASE", "AMYLASE" in the last 168 hours. Recent Labs  Lab 12/27/21 1651  AMMONIA 60*    ABG    Component Value Date/Time   TCO2 24 12/27/2021 1751     Coagulation Profile: Recent Labs  Lab 12/27/21 1650  INR 1.5*    Cardiac Enzymes: Recent Labs  Lab 12/27/21 1650  CKTOTAL 488*    HbA1C: Hgb A1c MFr Bld  Date/Time Value Ref Range Status  07/01/2015 09:33 AM 4.8 4.8 - 5.6 % Final     Comment:             Pre-diabetes: 5.7 - 6.4          Diabetes: >6.4          Glycemic control for adults with diabetes: <7.0     CBG: No results for input(s): "GLUCAP" in the last 168 hours.  Review of Systems: Positives in [place]  Gen: Denies fever, chills, weight change, fatigue, night sweats HEENT: Denies blurred vision, double vision, hearing loss, tinnitus, sinus congestion, rhinorrhea, sore throat, neck stiffness, dysphagia PULM: Denies shortness of breath, cough, sputum production, hemoptysis, wheezing CV: Denies chest  pain, edema, orthopnea, paroxysmal nocturnal dyspnea, palpitations GI: Denies abdominal pain, nausea, vomiting, diarrhea, hematochezia, melena, constipation, change in bowel habits GU: Denies dysuria, hematuria, polyuria, oliguria, urethral discharge Endocrine: Denies hot or cold intolerance, polyuria, polyphagia or appetite change Derm: Denies rash, dry skin, scaling or peeling skin change Heme: Denies easy bruising, bleeding, bleeding gums Neuro: Denies headache, numbness, generalized weakness, mechanical fall, slurred speech, loss of memory or consciousness  Past Medical History:  He,  has a past medical history of Acute bronchitis, Cervical high risk HPV (human papillomavirus) test positive (12/2017), Chronic back pain, Complication of anesthesia, Cough, Insomnia, unspecified, Other malaise and fatigue, Other specified diseases of blood and blood-forming organs(289.89), and Tobacco use disorder.   Surgical History:   Past Surgical History:  Procedure Laterality Date   BIOPSY  07/01/2021   Procedure: BIOPSY;  Surgeon: Jeani Hawking, MD;  Location: [facility] ENDOSCOPY;  Service: Gastroenterology;;  EGD and COLON   COLONOSCOPY WITH PROPOFOL N/A 07/01/2021   Procedure: COLONOSCOPY WITH PROPOFOL;  Surgeon: Jeani Hawking, MD;  Location: [facility] ENDOSCOPY;  Service: Gastroenterology;  Laterality: N/A;   ESOPHAGOGASTRODUODENOSCOPY N/A 03/17/2013   Procedure:  ESOPHAGOGASTRODUODENOSCOPY (EGD);  Surgeon: Meryl Dare, MD;  Location: [facility] ENDOSCOPY;  Service: Endoscopy;  Laterality: N/A;   ESOPHAGOGASTRODUODENOSCOPY (EGD) WITH PROPOFOL N/A 07/01/2021   Procedure: ESOPHAGOGASTRODUODENOSCOPY (EGD) WITH PROPOFOL;  Surgeon: Jeani Hawking, MD;  Location: [facility] ENDOSCOPY;  Service: Gastroenterology;  Laterality: N/A;   FRACTURE SURGERY     Laminectomy and foraminotomy at L5-S1    12/12/2019   TONSILLECTOMY       Social History:   reports that he has been smoking cigarettes. He has a 6.00 pack-year smoking history. He has never used smokeless tobacco. He reports that he does not currently use alcohol. He reports that he does not use drugs.   Family History:  His family history includes Arthritis in his mother; Hypertension in his brother, sister, sister, sister, and sister.   Allergies Allergies  Allergen Reactions   Garlic Anaphylaxis   Aspirin Hives   Lyrica [Pregabalin]     Nightmares, did not help neuropathy   Penicillins Hives     Home Medications  Prior to Admission medications   Medication Sig Start Date End Date Taking? Authorizing Provider  ciprofloxacin (CILOXAN) 0.3 % ophthalmic solution Place 1 drop into both eyes every 2 (two) hours. Administer 1 drop, every 2 hours, while awake, for 2 days. Then 1 drop, every 4 hours, while awake, for the next 5 days.    [provider]  DULoxetine (CYMBALTA) 30 MG capsule Take 1 capsule (30 mg total) by mouth daily. 11/24/21   Sharon Seller, NP  ferrous sulfate 325 (65 FE) MG tablet Take 325 mg by mouth 2 (two) times daily with a meal.    [provider]  folic acid (FOLVITE) 1 MG tablet Take 1 tablet (1 mg total) by mouth daily. 08/05/21   Rodolph Bong, MD  gabapentin (NEURONTIN) 100 MG capsule Take 1 capsule (100 mg total) by mouth 3 (three) times daily. 09/29/21   Sharon Seller, NP  magnesium oxide (MAG-OX) 400 MG tablet Take 1 tablet (400 mg total) by mouth 2 (two) times  daily. 08/04/21   Rodolph Bong, MD  metoprolol tartrate (LOPRESSOR) 25 MG tablet TAKE 1/2 TABLET(12.5 MG) BY MOUTH TWICE DAILY 12/01/21   Sharon Seller, NP  nystatin powder Apply 1 Application topically 3 (three) times daily. 10/24/21   Sharon Seller, NP  oxyCODONE-acetaminophen (PERCOCET/ROXICET)  5-325 MG tablet Take one tablet by mouth every 8-12 hours as needed for pain. 12/23/21   Sharon Seller, NP  pantoprazole (PROTONIX) 40 MG tablet TAKE 1 TABLET BY MOUTH DAILY 12/04/21   Sharon Seller, NP  Tetrahydrozoline HCl (VISINE OP) Place 1 drop into both eyes daily as needed (For dry eyes).    [provider]  Vitamin D, Ergocalciferol, (DRISDOL) 1.25 MG (50000 UNIT) CAPS capsule Take 1 capsule (50,000 Units total) by mouth every 7 (seven) days. 10/24/21   Sharon Seller, NP     Critical care time: 38 minutes     Canary Brim, MSN, APRN, NP-C, AGACNP-BC [facility] 12/27/2021, 7:07 PM   Please see [email or web address] for pager details.   From 7A-7P if no response, please call [phone] After hours, please call ELink [phone]

## 2021-12-27 NOTE — ED Triage Notes (Signed)
Pt BIB [facility] from home, patient reports increased weakness and laying in floor for at least today. Patient going in and out of SVT with EMS and on monitor. Pt hypotensive, initial BP 60/40, currently 90/60. Patient is A&Ox4, reports weakness and feeling tired.

## 2021-12-27 NOTE — Progress Notes (Signed)
eLink Physician-Brief Progress Note Patient Name: Sanna Porcaro DOB: [birth year] MRN: [id number]   Date of Service  12/27/2021  HPI/Events of Note  66 patient, who identified as female, presents to the ED with progressive weakness. Evaluation in the ED was significant for hypotension, with  severe anemia. He also had an elevated lactate, mild rhabdomyolysis, and AKI. He was started on empiric antibiotics, given fluids, and admitted to the ICU.   eICU Interventions  - Admit to ICU - Maintain 2 large bore IVs - Serial H/H, plan to transfuse pRBC to target Hgb>7, or if with hemodynamic instability - INR slightly elevated at 1.5. Would continue to monitor for evidence of bleeding. Consider vitamin K.  - PPI - GI consult - Started on empiric antibiotics - Monitor I/Os, daily weights, renally dose medications.   HR93, BP 101/74 on norepinephrine running at 41mcg/min.  Pressor is running through a peripheral IV that had been inserted by the VAS/IV team. Will monitor for evidence of extravasation.  Would expect pressor requirement to decrease further as pt is volume repleted.  If pt remains hypotensive, with escalating pressor requirement despite tranfusions and fluid boluses, then may require central line to be placed.         Amman Bartel M DELA CRUZ 12/27/2021, 9:09 PM

## 2021-12-27 NOTE — ED Notes (Signed)
Patient Spo2 dropped to 88% on room air, placed on 2L [place] O2, Spo2 increased to 91%

## 2021-12-27 NOTE — Progress Notes (Signed)
Patient has intermittent SVT, going into 170s HR. E-link MD is aware.

## 2021-12-27 NOTE — ED Provider Notes (Signed)
[facility] EMERGENCY DEPARTMENT Provider Note   CSN: [id number] Arrival date & time: 12/27/21  1609     History  Chief Complaint  Patient presents with   Weakness    Laura [name] is a 61 y.o. adult.  HPI   Patient with medical history of chronic back pain, hypertension, hyperlipidemia, hypokalemia, symptomatic anemia, transaminitis presents to the due to altered mental status.  She was found unresponsive after being on the floor for over a day per EMS report.  She was found to be altered and an intermittent episodes of SVT and sinus tachycardia.  She denies any pain anywhere, endorses coughing.  History is limited secondary to altered mental status.  Home Medications Prior to Admission medications   Medication Sig Start Date End Date Taking? Authorizing Provider  ciprofloxacin (CILOXAN) 0.3 % ophthalmic solution Place 1 drop into both eyes every 2 (two) hours. Administer 1 drop, every 2 hours, while awake, for 2 days. Then 1 drop, every 4 hours, while awake, for the next 5 days.    [provider]  DULoxetine (CYMBALTA) 30 MG capsule Take 1 capsule (30 mg total) by mouth daily. 11/24/21   Sharon Seller, NP  ferrous sulfate 325 (65 FE) MG tablet Take 325 mg by mouth 2 (two) times daily with a meal.    [provider]  folic acid (FOLVITE) 1 MG tablet Take 1 tablet (1 mg total) by mouth daily. 08/05/21   Rodolph Bong, MD  furosemide (LASIX) 20 MG tablet Take 20 mg by mouth daily. 11/30/21   [provider]  gabapentin (NEURONTIN) 100 MG capsule Take 1 capsule (100 mg total) by mouth 3 (three) times daily. 09/29/21   Sharon Seller, NP  magnesium oxide (MAG-OX) 400 MG tablet Take 1 tablet (400 mg total) by mouth 2 (two) times daily. 08/04/21   Rodolph Bong, MD  metoprolol tartrate (LOPRESSOR) 25 MG tablet TAKE 1/2 TABLET(12.5 MG) BY MOUTH TWICE DAILY 12/01/21   Sharon Seller, NP  nystatin powder Apply 1 Application topically 3  (three) times daily. 10/24/21   Sharon Seller, NP  oxyCODONE-acetaminophen (PERCOCET/ROXICET) 5-325 MG tablet Take one tablet by mouth every 8-12 hours as needed for pain. 12/23/21   Sharon Seller, NP  pantoprazole (PROTONIX) 40 MG tablet TAKE 1 TABLET BY MOUTH DAILY 12/04/21   Sharon Seller, NP  Tetrahydrozoline HCl (VISINE OP) Place 1 drop into both eyes daily as needed (For dry eyes).    [provider]  Vitamin D, Ergocalciferol, (DRISDOL) 1.25 MG (50000 UNIT) CAPS capsule Take 1 capsule (50,000 Units total) by mouth every 7 (seven) days. 10/24/21   Sharon Seller, NP      Allergies    Garlic, Aspirin, Lyrica [pregabalin], and Penicillins    Review of Systems   Review of Systems  Physical Exam Updated Vital Signs BP (!) 71/54   Pulse (!) 155   Temp (!) 97.5 F (36.4 C) (Rectal)   Resp (!) 22   SpO2 (!) 81%  Physical Exam Vitals and nursing note reviewed. Exam conducted with a chaperone present.  Constitutional:      Appearance: Normal appearance. He is ill-appearing.  HENT:     Head: Normocephalic and atraumatic.     Mouth/Throat:     Mouth: Mucous membranes are dry.  Eyes:     General: Scleral icterus present.        Right eye: No discharge.  Left eye: No discharge.     Extraocular Movements: Extraocular movements intact.     Pupils: Pupils are equal, round, and reactive to light.  Cardiovascular:     Rate and Rhythm: Regular rhythm. Tachycardia present.     Pulses: Normal pulses.     Heart sounds: Normal heart sounds. No murmur heard.    No friction rub. No gallop.     Comments: Tachycardic with regular rhythm. Pulmonary:     Effort: Pulmonary effort is normal. No respiratory distress.     Breath sounds: No wheezing.  Abdominal:     General: Abdomen is flat. Bowel sounds are normal. There is no distension.     Palpations: Abdomen is soft.     Tenderness: There is no abdominal tenderness.  Genitourinary:    Comments: Rectal exam  performed with female chaperone in room.  Stool does not appear melanotic. Musculoskeletal:        General: No tenderness.  Skin:    General: Skin is warm and dry.     Coloration: Skin is pale. Skin is not jaundiced.  Neurological:     Mental Status: He is alert. He is disoriented.     Coordination: Coordination normal.      ED Results / Procedures / Treatments   Labs (all labs ordered are listed, but only abnormal results are displayed) Labs Reviewed  LACTIC ACID, PLASMA - Abnormal; Notable for the following components:      Result Value   Lactic Acid, Venous 4.0 (*)    All other components within normal limits  COMPREHENSIVE METABOLIC PANEL - Abnormal; Notable for the following components:   Potassium 3.0 (*)    Chloride 95 (*)    BUN 31 (*)    Creatinine, Ser 2.14 (*)    Calcium 8.5 (*)    Total Protein 5.7 (*)    Albumin 2.3 (*)    AST 99 (*)    Total Bilirubin 11.8 (*)    GFR, Estimated 26 (*)    Anion gap 20 (*)    All other components within normal limits  CBC WITH DIFFERENTIAL/PLATELET - Abnormal; Notable for the following components:   RBC 1.10 (*)    Hemoglobin 5.0 (*)    HCT 15.5 (*)    MCV 140.9 (*)    MCH 45.5 (*)    RDW 19.7 (*)    Platelets 77 (*)    nRBC 1.1 (*)    Lymphs Abs 0.4 (*)    All other components within normal limits  PROTIME-INR - Abnormal; Notable for the following components:   Prothrombin Time 18.1 (*)    INR 1.5 (*)    All other components within normal limits  APTT - Abnormal; Notable for the following components:   aPTT 43 (*)    All other components within normal limits  AMMONIA - Abnormal; Notable for the following components:   Ammonia 60 (*)    All other components within normal limits  CK - Abnormal; Notable for the following components:   Total CK 488 (*)    All other components within normal limits  I-STAT CHEM 8, ED - Abnormal; Notable for the following components:   Potassium 3.1 (*)    Chloride 96 (*)    BUN 29  (*)    Creatinine, Ser 1.90 (*)    Calcium, Ion 1.01 (*)    Hemoglobin 5.4 (*)    HCT 16.0 (*)    All other components within normal limits  POC  OCCULT BLOOD, ED - Abnormal; Notable for the following components:   Fecal Occult Bld POSITIVE (*)    All other components within normal limits  SARS CORONAVIRUS 2 BY RT PCR  CULTURE, BLOOD (ROUTINE X 2)  CULTURE, BLOOD (ROUTINE X 2)  URINE CULTURE  TSH  LACTIC ACID, PLASMA  URINALYSIS, ROUTINE W REFLEX MICROSCOPIC  OCCULT BLOOD X 1 CARD TO LAB, STOOL  RAPID URINE DRUG SCREEN, HOSP PERFORMED  CBC  CREATININE, SERUM  CK  I-STAT BETA HCG BLOOD, ED ([facility], [facility], [facility] ONLY)  TYPE AND SCREEN  PREPARE RBC (CROSSMATCH)  TROPONIN I (HIGH SENSITIVITY)    EKG EKG Interpretation  Date/Time:  Saturday December 27 2021 16:19:01 EDT Ventricular Rate:  192 PR Interval:    QRS Duration: 83 QT Interval:  281 QTC Calculation: 503 R Axis:   74 Text Interpretation: Supraventricular tachycardia Repolarization abnormality, prob rate related Artifact in lead(s) I III aVL V2 and baseline wander in lead(s) V2 when compared to prior, similar SVT no STEMI Confirmed by Theda Belfast ([id number]) on 12/27/2021 4:44:12 PM  Radiology DG Chest Port 1 View  Result Date: 12/27/2021 CLINICAL DATA:  Chronic cough and temporary weakness x1 day. EXAM: PORTABLE CHEST 1 VIEW COMPARISON:  July 29, 2021 FINDINGS: The heart size and mediastinal contours are within normal limits. Both lungs are clear. The visualized skeletal structures are unremarkable. IMPRESSION: No active disease. Electronically Signed   By: Aram Candela M.D.   On: 12/27/2021 17:58    Procedures .Critical Care  Performed by: Theron Arista, PA-C Authorized by: Theron Arista, PA-C   Critical care provider statement:    Critical care time (minutes):  98   Critical care start time:  12/27/2021 4:00 PM   Critical care end time:  12/27/2021 7:08 PM   Critical care was necessary to treat or prevent imminent or  life-threatening deterioration of the following conditions:  Circulatory failure and shock   Critical care was time spent personally by me on the following activities:  Development of treatment plan with patient or surrogate, discussions with consultants, evaluation of patient's response to treatment, examination of patient, ordering and review of laboratory studies, ordering and review of radiographic studies, ordering and performing treatments and interventions, pulse oximetry, re-evaluation of patient's condition and review of old charts   Care discussed with: admitting provider       Medications Ordered in ED Medications  lactated ringers bolus 1,000 mL (has no administration in time range)  lactulose (CHRONULAC) enema 200 gm (has no administration in time range)  docusate sodium (COLACE) capsule 100 mg (has no administration in time range)  polyethylene glycol (MIRALAX / GLYCOLAX) packet 17 g (has no administration in time range)  heparin injection 5,000 Units (has no administration in time range)  0.9 %  sodium chloride infusion (has no administration in time range)  sodium chloride 0.9 % bolus 500 mL (has no administration in time range)  ciprofloxacin (CILOXAN) 0.3 % ophthalmic solution 1 drop (has no administration in time range)  calcium gluconate 1 g/ 50 mL sodium chloride IVPB (has no administration in time range)  norepinephrine (LEVOPHED) 4mg  in (0.016 mg/mL) premix infusion (has no administration in time range)  norepinephrine (LEVOPHED) 4-5 MG/250ML-% infusion SOLN (has no administration in time range)  lactated ringers bolus 1,000 mL (1,000 mLs Intravenous New Bag/Given 12/27/21 1815)  0.9 %  sodium chloride infusion (10 mL/hr Intravenous New Bag/Given 12/27/21 1741)  amiodarone (NEXTERONE) IV bolus only 150 mg/100 mL (150 mg  Intravenous New Bag/Given 12/27/21 1837)    ED Course/ Medical Decision Making/ A&P Clinical Course as of 12/27/21 1911  Sat Dec 27, 2021  1755 BP  90/60 at initial eval.  [HS]  1823 I consulted with [facility] gastroenterology.  They advise urine tox, ultrasound of right upper quadrant and liver Doppler, lactulose enema until back to baseline mental status and they will consult on patient. [HS]  1831 Spoke with critical care Dr. Wynona Neat -he graciously evaluated patient and has admitted them. [HS]  1909 I reevaluate the patient when the RN notified me.  Patient became critically ill and hypotensive again at 71/54.  Significantly tachycardic, she is still awake but disoriented.  Discussed with my attending, patient started on Levophed.  Patient getting blood from blood bank.  [HS]    Clinical Course User Index [HS] Theron Arista, PA-C                           Medical Decision Making Amount and/or Complexity of Data Reviewed External Data Reviewed: labs, radiology, ECG and notes.    Details: Per HPI Labs: ordered. Decision-making details documented in ED Course. Radiology: ordered and independent interpretation performed. Decision-making details documented in ED Course. ECG/medicine tests: ordered and independent interpretation performed. Decision-making details documented in ED Course.  Risk Prescription drug management. Decision regarding hospitalization.   Patient presents critically ill and intermittent sinus tachycardia and SVT which is self converting.  She is pale with dry mucous membranes, altered with diffuse wheezing throughout lung fields.  Disoriented but follows commands.    External records reviewed.  Additional history obtained from EMS, nursing note.  Patient's caregiver is also in the room providing independent history.  I ordered, viewed and interpreted laboratory work-up. -CBC without leukocytosis.  Patient's hemoglobin is decreased at 5.0.  No platelets are also significantly decreased at 77. -Although stool did not appear melanic fecal occult is positive. -Ammonia elevated at 60 -pCK elevated at 488. -CMP with  hypokalemia of 3.0.  Patient's creatinine is elevated at 2.14, transaminitis with an AST of 99.  Bilirubin is significantly elevated 11.8. -PT/INR, PTT mildly elevated. -Lactic acid elevated at 4.0  I ordered Levophed, lactated ringer bolus 2 L, 2 units packed red blood cells.  EKG shows SVT.  Patient is on cardiac monitoring and is going between intermittent sinus tachycardia and SVT.  Chest x-ray without any acute process, agree with radiologist interpretation.  Patient is critically ill, hypotensive with symptomatic anemia, hyperbilirubinemia, AKI.  She is admitted to the critical care service, gastroenterology will consult.   Discussed HPI, physical exam and plan of care for this patient with attending Lynden Oxford. The attending physician evaluated this patient as part of a shared visit and agrees with plan of care.         Final Clinical Impression(s) / ED Diagnoses Final diagnoses:  AKI (acute kidney injury) (HCC)  Symptomatic anemia  Hyperbilirubinemia  SVT (supraventricular tachycardia) [facility])    Rx / DC Orders ED Discharge Orders     None         Theron Arista, [place] 12/27/21 1911    Tegeler, Canary Brim, MD 12/28/21 2051

## 2021-12-28 ENCOUNTER — Inpatient Hospital Stay (HOSPITAL_COMMUNITY): Payer: Medicare Other

## 2021-12-28 ENCOUNTER — Inpatient Hospital Stay: Payer: Self-pay

## 2021-12-28 DIAGNOSIS — K7011 Alcoholic hepatitis with ascites: Secondary | ICD-10-CM | POA: Diagnosis not present

## 2021-12-28 DIAGNOSIS — K7031 Alcoholic cirrhosis of liver with ascites: Secondary | ICD-10-CM

## 2021-12-28 DIAGNOSIS — J9601 Acute respiratory failure with hypoxia: Secondary | ICD-10-CM

## 2021-12-28 DIAGNOSIS — R6521 Severe sepsis with septic shock: Secondary | ICD-10-CM

## 2021-12-28 DIAGNOSIS — A419 Sepsis, unspecified organism: Secondary | ICD-10-CM | POA: Diagnosis not present

## 2021-12-28 DIAGNOSIS — D508 Other iron deficiency anemias: Secondary | ICD-10-CM | POA: Diagnosis not present

## 2021-12-28 DIAGNOSIS — L8990 Pressure ulcer of unspecified site, unspecified stage: Secondary | ICD-10-CM | POA: Insufficient documentation

## 2021-12-28 DIAGNOSIS — D649 Anemia, unspecified: Secondary | ICD-10-CM

## 2021-12-28 LAB — COMPREHENSIVE METABOLIC PANEL
ALT: 33 U/L (ref 0–44)
AST: 69 U/L — ABNORMAL HIGH (ref 15–41)
Albumin: 1.9 g/dL — ABNORMAL LOW (ref 3.5–5.0)
Alkaline Phosphatase: 91 U/L (ref 38–126)
Anion gap: 15 (ref 5–15)
BUN: 30 mg/dL — ABNORMAL HIGH (ref 6–20)
CO2: 22 mmol/L (ref 22–32)
Calcium: 8.2 mg/dL — ABNORMAL LOW (ref 8.9–10.3)
Chloride: 97 mmol/L — ABNORMAL LOW (ref 98–111)
Creatinine, Ser: 1.8 mg/dL — ABNORMAL HIGH (ref 0.44–1.00)
GFR, Estimated: 32 mL/min — ABNORMAL LOW (ref >60)
Glucose, Bld: 98 mg/dL (ref 70–99)
Potassium: 3.8 mmol/L (ref 3.5–5.1)
Sodium: 134 mmol/L — ABNORMAL LOW (ref 135–145)
Total Bilirubin: 13.6 mg/dL — ABNORMAL HIGH (ref 0.3–1.2)
Total Protein: 4.8 g/dL — ABNORMAL LOW (ref 6.5–8.1)

## 2021-12-28 LAB — ECHOCARDIOGRAM COMPLETE
AR max vel: 2.58 cm2
AV Area VTI: 2.37 cm2
AV Area mean vel: 2.33 cm2
AV Mean grad: 4 mmHg
AV Peak grad: 6.6 mmHg
Ao pk vel: 1.28 m/s
Area-P 1/2: 3.99 cm2
S' Lateral: 2.6 cm
Weight: 2243.4 oz

## 2021-12-28 LAB — GLUCOSE, CAPILLARY
Glucose-Capillary: 109 mg/dL — ABNORMAL HIGH (ref 70–99)
Glucose-Capillary: 109 mg/dL — ABNORMAL HIGH (ref 70–99)
Glucose-Capillary: 126 mg/dL — ABNORMAL HIGH (ref 70–99)
Glucose-Capillary: 132 mg/dL — ABNORMAL HIGH (ref 70–99)
Glucose-Capillary: 132 mg/dL — ABNORMAL HIGH (ref 70–99)
Glucose-Capillary: 125 mg/dL — ABNORMAL HIGH (ref 70–99)

## 2021-12-28 LAB — CBC
HCT: 23.5 % — ABNORMAL LOW (ref 36.0–46.0)
HCT: 22.5 % — ABNORMAL LOW (ref 36.0–46.0)
HCT: 22.1 % — ABNORMAL LOW (ref 36.0–46.0)
Hemoglobin: 8.2 g/dL — ABNORMAL LOW (ref 12.0–15.0)
Hemoglobin: 8 g/dL — ABNORMAL LOW (ref 12.0–15.0)
Hemoglobin: 7.6 g/dL — ABNORMAL LOW (ref 12.0–15.0)
MCH: 34.7 pg — ABNORMAL HIGH (ref 26.0–34.0)
MCH: 35.4 pg — ABNORMAL HIGH (ref 26.0–34.0)
MCH: 35 pg — ABNORMAL HIGH (ref 26.0–34.0)
MCHC: 34.9 g/dL (ref 30.0–36.0)
MCHC: 35.6 g/dL (ref 30.0–36.0)
MCHC: 34.4 g/dL (ref 30.0–36.0)
MCV: 99.6 fL (ref 80.0–100.0)
MCV: 99.6 fL (ref 80.0–100.0)
MCV: 101.8 fL — ABNORMAL HIGH (ref 80.0–100.0)
Platelets: 62 10*3/uL — ABNORMAL LOW (ref 150–400)
Platelets: 53 10*3/uL — ABNORMAL LOW (ref 150–400)
Platelets: 45 10*3/uL — ABNORMAL LOW (ref 150–400)
RBC: 2.36 MIL/uL — ABNORMAL LOW (ref 3.87–5.11)
RBC: 2.26 MIL/uL — ABNORMAL LOW (ref 3.87–5.11)
RBC: 2.17 MIL/uL — ABNORMAL LOW (ref 3.87–5.11)
WBC: 2.5 10*3/uL — ABNORMAL LOW (ref 4.0–10.5)
WBC: 4.9 10*3/uL (ref 4.0–10.5)
WBC: 7.4 10*3/uL (ref 4.0–10.5)
nRBC: 2.8 % — ABNORMAL HIGH (ref 0.0–0.2)
nRBC: 1.4 % — ABNORMAL HIGH (ref 0.0–0.2)
nRBC: 0.7 % — ABNORMAL HIGH (ref 0.0–0.2)

## 2021-12-28 LAB — BILIRUBIN, FRACTIONATED(TOT/DIR/INDIR)
Bilirubin, Direct: 8.2 mg/dL — ABNORMAL HIGH (ref 0.0–0.2)
Indirect Bilirubin: 6.4 mg/dL — ABNORMAL HIGH (ref 0.3–0.9)
Total Bilirubin: 14.6 mg/dL — ABNORMAL HIGH (ref 0.3–1.2)

## 2021-12-28 LAB — CK: Total CK: 257 U/L — ABNORMAL HIGH (ref 38–234)

## 2021-12-28 LAB — LACTIC ACID, PLASMA
Lactic Acid, Venous: 1.4 mmol/L (ref 0.5–1.9)
Lactic Acid, Venous: 2 mmol/L (ref 0.5–1.9)
Lactic Acid, Venous: 1.2 mmol/L (ref 0.5–1.9)

## 2021-12-28 LAB — AMMONIA: Ammonia: 58 umol/L — ABNORMAL HIGH (ref 9–35)

## 2021-12-28 LAB — MRSA NEXT GEN BY PCR, NASAL: MRSA by PCR Next Gen: NOT DETECTED

## 2021-12-28 MED ORDER — OCTREOTIDE LOAD VIA INFUSION: 50.0000 ug | Freq: Once | INTRAVENOUS | Status: DC

## 2021-12-28 MED ORDER — MIDODRINE HCL 5 MG PO TABS
10.0000 mg | ORAL_TABLET | Freq: Three times a day (TID) | ORAL | Status: DC
  Administered 2021-12-29: 10 mg via ORAL
  Filled 2021-12-28: qty 2

## 2021-12-28 MED ORDER — PANTOPRAZOLE 80MG IVPB - SIMPLE MED: 80.0000 mg | Freq: Once | INTRAVENOUS | Status: DC

## 2021-12-28 MED ORDER — METOPROLOL TARTRATE 5 MG/5ML IV SOLN
5.0000 mg | Freq: Once | INTRAVENOUS | Status: AC
  Administered 2021-12-28: 5 mg via INTRAVENOUS
  Filled 2021-12-28: qty 5

## 2021-12-28 MED ORDER — PANTOPRAZOLE SODIUM 40 MG IV SOLR: 40.0000 mg | Freq: Two times a day (BID) | INTRAVENOUS | Status: DC

## 2021-12-28 MED ORDER — FOLIC ACID 5 MG/ML IJ SOLN
1.0000 mg | Freq: Every day | INTRAMUSCULAR | Status: DC
  Administered 2021-12-29 – 2022-01-06 (×9): 1 mg via INTRAVENOUS
  Filled 2021-12-28 (×10): qty 0.2

## 2021-12-28 MED ORDER — LACTATED RINGERS IV BOLUS
1000.0000 mL | Freq: Once | INTRAVENOUS | Status: AC
  Administered 2021-12-28: 1000 mL via INTRAVENOUS

## 2021-12-28 MED ORDER — VANCOMYCIN HCL 750 MG/150ML IV SOLN
750.0000 mg | INTRAVENOUS | Status: DC
  Administered 2021-12-28: 750 mg via INTRAVENOUS
  Filled 2021-12-28: qty 150

## 2021-12-28 MED ORDER — SODIUM CHLORIDE 0.9 % IV SOLN
1.0000 mg | Freq: Every day | INTRAVENOUS | Status: DC
  Filled 2021-12-28: qty 0.2

## 2021-12-28 MED ORDER — PANTOPRAZOLE SODIUM 40 MG IV SOLR
40.0000 mg | INTRAVENOUS | Status: DC
  Administered 2021-12-28: 40 mg via INTRAVENOUS
  Filled 2021-12-28: qty 10

## 2021-12-28 MED ORDER — CHLORHEXIDINE GLUCONATE CLOTH 2 % EX PADS
6.0000 | MEDICATED_PAD | Freq: Every day | CUTANEOUS | Status: DC
Start: 2021-12-28 — End: 2022-01-19
  Administered 2021-12-27 – 2022-01-19 (×21): 6 via TOPICAL

## 2021-12-28 MED ORDER — GERHARDT'S BUTT CREAM
TOPICAL_CREAM | Freq: Three times a day (TID) | CUTANEOUS | Status: DC | PRN
  Administered 2022-01-07: 1 via TOPICAL

## 2021-12-28 MED ORDER — PANTOPRAZOLE INFUSION (NEW) - SIMPLE MED
8.0000 mg/h | INTRAVENOUS | Status: DC
  Filled 2021-12-28: qty 100

## 2021-12-28 MED ORDER — SODIUM CHLORIDE 0.9 % IV SOLN: 50.0000 ug/h | INTRAVENOUS | Status: DC

## 2021-12-28 MED ORDER — MORPHINE SULFATE (PF) 2 MG/ML IV SOLN
1.0000 mg | INTRAVENOUS | Status: AC | PRN
  Administered 2021-12-28 – 2021-12-30 (×4): 1 mg via INTRAVENOUS
  Filled 2021-12-28 (×4): qty 1

## 2021-12-28 MED ORDER — ORAL CARE MOUTH RINSE: 15.0000 mL | OROMUCOSAL | Status: DC | PRN

## 2021-12-28 NOTE — Progress Notes (Signed)
   12/28/21 1800  Provider Notification  Provider Name/Title Dr. Mignon Pine  Date Provider Notified 12/28/21  Time Provider Notified 1800  Method of Notification Face-to-face  Notification Reason Critical result (lactate 2.0)

## 2021-12-28 NOTE — Progress Notes (Signed)
Consult received for PIV placement for vasopressor. No appropriate vein found at this time. See IV assessment flowsheet. Notified nurse. VU. Tomasita Morrow, RN VAST

## 2021-12-28 NOTE — Progress Notes (Signed)
Dear Doctor: This patient has been identified as a candidate for PICC for the following reason (s): drug extravasation potential with tissue necrosis (KCL, Dilantin, Dopamine, CaCl, MgSO4, chemo vesicant), poor veins/poor circulatory system (CHF, COPD, emphysema, diabetes, steroid use, IV drug abuse, etc.), and restarts due to phlebitis and infiltration in 24 hours If you agree, please write an order for the indicated device.   Thank you for supporting the early vascular access assessment program.

## 2021-12-28 NOTE — Progress Notes (Signed)
eLink Physician-Brief Progress Note Patient Name: Kirsti Mcalpine DOB: 04-09-1961 MRN: [id number]   Date of Service  12/28/2021  HPI/Events of Note  Pain over buttocks, perineum, sacrum.  Areas have skin breakdown, had pain after cleaning.   eICU Interventions  Placed order for low dose morphine.  Will continue to monitor closely.         Amelita Risinger M DELA CRUZ 12/28/2021, 5:48 AM

## 2021-12-28 NOTE — Progress Notes (Signed)
eLink Physician-Brief Progress Note Patient Name: Laura [name] DOB: 01-20-1961 MRN: [id number]   Date of Service  12/28/2021  HPI/Events of Note  Notified of poor IV access.  Pt is getting peripheral levophed. IV team could no successfully put in an ultrasound-guided line and no PICC team to insert a PICC tonight. Pt has only 1IV line now.   eICU Interventions  Ground team made awake of need for central line placement.      Intervention Category Intermediate Interventions: Other:  Larinda Buttery 12/28/2021, 8:40 PM

## 2021-12-28 NOTE — Progress Notes (Signed)
Pharmacy Antibiotic Note  Laura [name] is a 61 y.o. adult for which pharmacy has been consulted for cefepime and vancomycin dosing for sepsis.  Patient with a history of chronic back pain, HTN, HLD, hypokalemia, anemia, transaminitis. Patient presenting after being found unresponsive.   Cipro eye drops ordered by CCM for conjunctivitis.  SCr 1.9>> 1.8 - above baseline; CK 488 >>257 WBC 2.5>>4.9; LA 4.9>>1.4; afebrile  Plan: Continue Cefepime 2g IV every 12 hours Vancomycin 750mg  IV every 24 hours (cAUC 518; SCr 1.8) Trend WBC, Fever, Renal function F/u cultures, clinical course, WBC, fever De-escalate when able  Weight: 63.6 kg (140 lb 3.4 oz)  Temp (24hrs), Avg:98 F (36.7 C), Min:97.3 F (36.3 C), Max:99.2 F (37.3 C)  Recent Labs  Lab 12/27/21 1649 12/27/21 1650 12/27/21 1751 12/27/21 1839 12/28/21 0321 12/28/21 1039  WBC  --  5.5  --   --  2.5* 4.9  CREATININE  --  2.14* 1.90*  --  1.80*  --   LATICACIDVEN 4.0*  --   --  4.9*  --  1.4    Estimated Creatinine Clearance (by C-G formula based on SCr of 1.8 mg/dL (H)) Female: 02/27/22 mL/min (A) Female: 39.8 mL/min (A)    Allergies  Allergen Reactions   Garlic Anaphylaxis   Aspirin Hives   Lyrica [Pregabalin]     Nightmares, did not help neuropathy   Penicillins Hives    Antimicrobials this admission: cefepime 9/9 >>  vancomycin 9/9 >>  Microbiology results: 9/9 BCx >> 9/10 MRSA PCR negative 9/9 UCx >>  Thank you for allowing pharmacy to be a part of this patient's care.  11/9, PharmD, BCPS, BCCCP Clinical Pharmacist Please refer to [facility] for [facility] Pharmacy numbers 12/28/2021 3:16 PM

## 2021-12-28 NOTE — Consult Note (Addendum)
Consultation  Referring Provider:   [facility] Primary Care Physician:  Sharon Seller, NP Primary Gastroenterologist:  Gentry Fitz    (Last seen 07/2021 suppose to have FU in office with Dr. Tomasa Rand) Reason for Consultation:   Acute on chronic anemia    Attending physician's note  I have taken a history, reviewed the chart and examined the patient. I performed a substantive portion of this encounter, including complete performance of at least one of the key components, in conjunction with the APP. I agree with the APP's note, impression and recommendations.    61 year old female, identifies as female admitted s/p fall, failure to thrive with severe anemia.  Patient denies active alcohol abuse, reports last alcoholic use was >7 months but presentation and lab data is suspicious for ongoing alcohol use  He has pancytopenia, severe thrombocytopenia  Hemoglobin responded appropriately to PRBC transfusion.  No overt GI bleeding.  Denies any history of melena or rectal bleeding  Reviewed EGD and colonoscopy that was done during last hospitalization in March 2023 with similar presentation, negative for esophageal or gastric varices.  No plan for repeat diagnostic evaluation as inpatient for acute on chronic anemia in the absence of overt GI bleeding  MELD-NA score 27, decompensated cirrhosis with ascites and hyperbilirubinemia  Acute alcoholic hepatitis, discriminant function score less than 32, no indication for steroids On broad-spectrum antibiotics for possible sepsis, infectious work-up pending On Levophed SVT  Lactulose 15 cc every 6 hours for hepatic encephalopathy.  No asterixis  Please call GI with any questions  K. Scherry Ran , MD [phone]      Impression    Acute on chronic anemia Admitting hemoglobin 5, patient's received a total of 3 PRBC, currently hemoglobin 8.2.   Multiple admissions for anemia requiring 6 units of packed red blood cells total since  March 07/01/2021 EGD and colonoscopy-colonoscopy unremarkable, EGD partial blunting of villi without evidence of celiac disease, celiac serology negative. Hematology consulted that visit thought to be anemia of chronic disease Recent unwitnessed fall with mild rhabdomyolysis, hematoma left hip, patient with significant skin breakdown over buttocks, perineum and sacrum Pending Hemoccult  MASH formally known as NASH with possible EtOH component Platelets 62 AST 69 ALT 33  Alkphos 91 TBili 13.6 GFR 32  INR 12/27/2021 1.5  With current labs do question current drinking, per patient last drink 7 months ago.  AKI Mild rhabdomyolysis, CPK decreasing  SIRS criteria On empiric antibiotics Cultures pending  Recurrent SVT Another episode this morning, currently heart rate stabilized.  Mild protein calorie malnutrition Albumin 12/28/2021  1.9  BMI body mass index is 21.96 kg/m.  Secondary to failure to thrive Count calories, increase protein     Plan   - IV protonix BID during ICU status. - Monitor CBC, continue volume resuscitation with Hgb goal >7  -Patient is on IV antibiotics per primary team -No plans for endoscopic evaluation at this time since patient had EGD March and no signs of overt GI bleeding at this time. If any overt GI bleeding, can consider repeat EGD. -Patient without cirrhosis but does have MASH, with current picture question current alcohol use and possible alcoholic hepatitis. Trend INR daily, will get ammonia -INR 1.5, albumin low, question nutritional component.  Suggest vitamin K. -We will add on fractionated bilirubin with elevated bili and recent fall  Thank you for your kind consultation, we will continue to follow.         HPI:   Laura [name]  is a 61 y.o. adult with past medical history significant for hypertension, chronic lower back pain, chronic diarrhea, metabolic dysfunction associated hepatic steatosis with hepatomegaly and history of alcohol use  presents after fall at home with worsening anemia.  Patient is also had multiple admissions for anemia, 06/30/2021, patient required 2 packed red blood cells and had endoscopy and colonoscopy 07/01/2021, he was FOBT negative that visit. EGD showed a normal esophagus and stomach, duodenal biopsies showed fragments of small bowel mucosa with partial blunting of villi without evidence of intraepithelial lymphocytosis which was not diagnostic for celiac disease.  Celiac serology was negative.   The colonoscopy was normal without evidence of colitis. Patient last seen 07/30/2021 in the hospital by our team for elevated liver functions and anemia, he was admitted for SVT, shortness of breath, found to have hemoglobin 6.9.  During this admission patient received 3 packed red blood cells. MRCP without contrast showed severe hepatic steatosis with hepatomegaly without evidence of biliary obstruction or concerning liver lesion or mass.  No splenomegaly. Visit patient had negative hepatitis studies, negative ANA and AMA, SMA, IgG 0 plasm and A1 AT. Hematology consulted during that admission thought anemia was felt to be related to anemia of chronic disease in setting of liver disease.  This admission patient had fall after progressive weakness.   Laura [name] is in the room with the patient and she provides majority of the history. States patient's had 3 days of weakness, has had some decreased appetite, denies nausea or vomiting.  Had 1 episode of loose stools watery yesterday, denies melena or hematochezia. No reflux, or dysphagia. Patient was on the bathroom floor for at least a day.   When found by EMS had systolic blood pressure in 60s with SVT. Per patient quit drinking 7 months ago, multiple stories for alcohol use, consider PETH. Admitting hemoglobin 5, patient's received a total of packed red blood cells, currently hemoglobin 8.2.   Patient continues to have thrombocytopenia worsening from previous  admissions was 77 on admission currently 1862.  Also this morning has white blood cell count of 2.5. Patient's admitting [place] 48 currently at 257. Patient has AKI on admission admitting BUN 31 currently at 30.   Baseline creatinine 0.64 on admission 2.14 currently at 1.8. AST 99 on admission, currently 69.  ALT 33 within normal limits. Patient's had previously elevated total bilirubin as high as 8, June 1.6.  On admission total bilirubin 11.8 currently at 13.6.  Indirect bilirubin has not been done.  Patient's albumin 1.9. INR elevated at 1.5, albumin low.   Has previous heavy alcohol use, last visit states she had not had any alcohol for years, appears she told hospitalist last drink was February 2023. No drug use, infrequent NSAID use. Mother died age 59 secondary liver cancer.  GI PROCEDURES:   EGD 07/01/2021: - Normal esophagus. - Normal stomach. - Normal examined duodenum. Biopsied.   Colonoscopy 07/01/2021: - Normal mucosa in the entire examined colon. Biopsied. SMALL BOWEL, RANDOM, BIOPSY:  Fragments of small bowel mucosa with partial blunting of villi.  Mild inflammation is seen but no increase in intraepithelial lymphocytes  is noted.  The current biopsy is not diagnostic of celiac disease.  No dysplasia or malignancy is seen.   B. COLON, RANDOM, BIOPSY:  Fragments of large bowel mucosa with no significant inflammation,  chronic crypt architectural changes, dysplasia or malignancy seen.  Clinical and endoscopic correlation is required.   Past Medical History:  Diagnosis Date   Acute bronchitis  Cervical high risk HPV (human papillomavirus) test positive 12/2017   Negative subtype 16, 18/45   Chronic back pain    Complication of anesthesia    "didn't wake up"   Cough    Insomnia, unspecified    Other malaise and fatigue    Other specified diseases of blood and blood-forming organs(289.89)    Tobacco use disorder     Surgical History:  He  has a past surgical  history that includes Fracture surgery; Tonsillectomy; Esophagogastroduodenoscopy (N/A, 03/17/2013); Laminectomy and foraminotomy at L5-S1   (12/12/2019); Esophagogastroduodenoscopy (egd) with propofol (N/A, 07/01/2021); Colonoscopy with propofol (N/A, 07/01/2021); and biopsy (07/01/2021). Family History:  His family history includes Arthritis in his mother; Hypertension in his brother, sister, sister, sister, and sister. Social History:   reports that he has been smoking cigarettes. He has a 6.00 pack-year smoking history. He has never used smokeless tobacco. He reports that he does not currently use alcohol. He reports that he does not use drugs.  Prior to Admission medications   Medication Sig Start Date End Date Taking? Authorizing Provider  ciprofloxacin (CILOXAN) 0.3 % ophthalmic solution Place 1 drop into both eyes every 2 (two) hours. Administer 1 drop, every 2 hours, while awake, for 2 days. Then 1 drop, every 4 hours, while awake, for the next 5 days.    [provider]  DULoxetine (CYMBALTA) 30 MG capsule Take 1 capsule (30 mg total) by mouth daily. 11/24/21   Lauree Chandler, NP  ferrous sulfate 325 (65 FE) MG tablet Take 325 mg by mouth 2 (two) times daily with a meal.    [provider]  folic acid (FOLVITE) 1 MG tablet Take 1 tablet (1 mg total) by mouth daily. 08/05/21   Eugenie Filler, MD  furosemide (LASIX) 20 MG tablet Take 20 mg by mouth daily. 11/30/21   [provider]  gabapentin (NEURONTIN) 100 MG capsule Take 1 capsule (100 mg total) by mouth 3 (three) times daily. 09/29/21   Lauree Chandler, NP  magnesium oxide (MAG-OX) 400 MG tablet Take 1 tablet (400 mg total) by mouth 2 (two) times daily. 08/04/21   Eugenie Filler, MD  metoprolol tartrate (LOPRESSOR) 25 MG tablet TAKE 1/2 TABLET(12.5 MG) BY MOUTH TWICE DAILY 12/01/21   Lauree Chandler, NP  nystatin powder Apply 1 Application topically 3 (three) times daily. 10/24/21   Lauree Chandler, NP   oxyCODONE-acetaminophen (PERCOCET/ROXICET) 5-325 MG tablet Take one tablet by mouth every 8-12 hours as needed for pain. 12/23/21   Lauree Chandler, NP  pantoprazole (PROTONIX) 40 MG tablet TAKE 1 TABLET BY MOUTH DAILY 12/04/21   Lauree Chandler, NP  Tetrahydrozoline HCl (VISINE OP) Place 1 drop into both eyes daily as needed (For dry eyes).    [provider]  Vitamin D, Ergocalciferol, (DRISDOL) 1.25 MG (50000 UNIT) CAPS capsule Take 1 capsule (50,000 Units total) by mouth every 7 (seven) days. 10/24/21   Lauree Chandler, NP    Current Facility-Administered Medications  Medication Dose Route Frequency Provider Last Rate Last Admin   0.9 %  sodium chloride infusion   Intravenous Continuous Olalere, Adewale A, MD   Stopped at 12/28/21 0513   0.9 %  sodium chloride infusion  250 mL Intravenous Continuous Andres Labrum D, PA-C       ceFEPIme (MAXIPIME) 2 g in sodium chloride 0.9 % 100 mL IVPB  2 g Intravenous Q12H Laurin Coder, MD   Stopped at 12/27/21 2035  Chlorhexidine Gluconate Cloth 2 % PADS 6 each  6 each Topical Q0600 Olalere, Adewale A, MD   6 each at 12/27/21 2126   ciprofloxacin (CILOXAN) 0.3 % ophthalmic solution 1 drop  1 drop Both Eyes Q4H while awake Ollis, Brandi L, NP   1 drop at 12/28/21 0513   docusate sodium (COLACE) capsule 100 mg  100 mg Oral BID PRN Olalere, Adewale A, MD       lactulose (CHRONULAC) enema 200 gm  300 mL Rectal Once Theron Arista, PA-C       morphine (PF) 2 MG/ML injection 1 mg  1 mg Intravenous Q3H PRN Gaetana Michaelis, MD       norepinephrine (LEVOPHED) 4mg  in (0.016 mg/mL) premix infusion  2-10 mcg/min Intravenous Titrated D, PA-C 30 mL/hr at 12/28/21 0642 8 mcg/min at 12/28/21 02/27/22   Oral care mouth rinse  15 mL Mouth Rinse PRN Olalere, Adewale A, MD       pantoprazole (PROTONIX) injection 40 mg  40 mg Intravenous Q12H 4098, MD   40 mg at 12/27/21 2258   polyethylene glycol (MIRALAX / GLYCOLAX) packet 17 g   17 g Oral Daily PRN Olalere, Adewale A, MD       thiamine (VITAMIN B1) injection 100 mg  100 mg Intravenous Daily 2259 L, NP   100 mg at 12/27/21 2031   vancomycin variable dose per unstable renal function (pharmacist dosing)   Does not apply See admin instructions 2032 A, MD        Allergies as of 12/27/2021 - Review Complete 12/27/2021  Allergen Reaction Noted   Garlic Anaphylaxis 07/20/2021   Aspirin Hives 05/11/2011   Lyrica [pregabalin]  10/25/2019   Penicillins Hives 05/11/2011    Review of Systems:    Constitutional: No weight loss, fever, chills, weakness or fatigue HEENT: Eyes: No change in vision               Ears, Nose, Throat:  No change in hearing or congestion Skin: No rash or itching Cardiovascular: No chest pain, chest pressure or palpitations   Respiratory: No SOB or cough Gastrointestinal: See HPI and otherwise negative Genitourinary: No dysuria or change in urinary frequency Neurological: No headache, dizziness or syncope Musculoskeletal: No new muscle or joint pain Hematologic: No bleeding or bruising Psychiatric: No history of depression or anxiety     Physical Exam:  Vital signs in last 24 hours: Temp:  [97.3 F (36.3 C)-99.2 F (37.3 C)] 98.6 F (37 C) (09/10 0338) Pulse Rate:  [65-155] 109 (09/10 0645) Resp:  [16-43] 19 (09/10 0645) BP: (58-151)/(42-139) 97/65 (09/10 0645) SpO2:  [88 %-100 %] 100 % (09/10 0645) FiO2 (%):  [100 %] 100 % (09/10 0015) Weight:  [63.6 kg-68 kg] 63.6 kg (09/09 2126) Last BM Date :  (PTA) Last BM recorded by nurses in past 5 days Stool Type: Type 7 (Liquid consistency with no solid pieces) (12/28/2021  5:00 AM)  General: Lethargic, no acute distress, chronically critically ill  Head:  Normocephalic and atraumatic. Eyes: Conjunctival crusting, erythema, icterus. Heart:  regular rate and rhythm Pulm: Clear anteriorly; no wheezing, nasal cannula in place Abdomen:  Soft, Non-distended AB, Sluggish  bowel sounds. No tenderness , No organomegaly appreciated. Extremities: Mild nonpitting edema, sensitive to touch, mild hematoma left hip Msk:  Symmetrical without gross deformities. Peripheral pulses intact.  Neurologic:  Alert and  oriented x4; lethargic no focal deficits.  Psychiatric:  Cooperative, able  to answer questions but appears lethargic possible from pain medications,  Normal mood and affect.  LAB RESULTS: Recent Labs    12/27/21 1650 12/27/21 1751 12/28/21 0321  WBC 5.5  --  2.5*  HGB 5.0* 5.4* 8.2*  HCT 15.5* 16.0* 23.5*  PLT 77*  --  62*   BMET Recent Labs    12/27/21 1650 12/27/21 1751 12/28/21 0321  NA 137 136 134*  K 3.0* 3.1* 3.8  CL 95* 96* 97*  CO2 22  --  22  GLUCOSE 82 75 98  BUN 31* 29* 30*  CREATININE 2.14* 1.90* 1.80*  CALCIUM 8.5*  --  8.2*   LFT Recent Labs    12/28/21 0321  PROT 4.8*  ALBUMIN 1.9*  AST 69*  ALT 33  ALKPHOS 91  BILITOT 13.6*   PT/INR Recent Labs    12/27/21 1650  LABPROT 18.1*  INR 1.5*    STUDIES: US LIVER DOPPLER  Result Date: 12/27/2021 CLINICAL DATA:  Right upper quadrant pain EXAM: DUPLEX ULTRASOUND OF LIVER TECHNIQUE: Color and duplex Doppler ultrasound was performed to evaluate the hepatic in-flow and out-flow vessels. COMPARISON:  MRCP 07/30/2021 FINDINGS: Liver: Hepatomegaly and hepatic steatosis. No focal lesion, mass or intrahepatic biliary ductal dilatation. Main Portal Vein size: 1.0 cm Portal Vein Velocities Main Prox:  24 cm/sec Main Mid: 21 cm/sec Main Dist:  24 cm/sec Right: Unable to obtain due to patient cooperation Left: Unable to obtain due to patient cooperation Hepatic Vein Velocities Right:  50 cm/sec Middle:  76 cm/sec Left:  26 cm/sec IVC: Present and patent with normal respiratory phasicity. Hepatic Artery Velocity:  127 cm/sec Splenic Vein Velocity:  Unable to obtain Spleen: 9.2 x 6.9 x 4.8 cm with a total volume of 157.6 cm^3 (411 cm^3 is upper limit normal) Portal Vein Occlusion/Thrombus:  No Splenic Vein Occlusion/Thrombus: No Ascites: Trace perihepatic ascites Varices: None IMPRESSION: 1. The portal and hepatic veins are patent where visualized with antegrade flow. 2. Hepatomegaly and hepatic steatosis. Electronically Signed   By: Placido Sou M.D.   On: 12/27/2021 19:52   US Abdomen Limited RUQ (LIVER/GB)  Result Date: 12/27/2021 CLINICAL DATA:  Right upper quadrant pain EXAM: ULTRASOUND ABDOMEN LIMITED RIGHT UPPER QUADRANT COMPARISON:  MRCP 07/30/2021 and CT abdomen and pelvis 06/29/2021 FINDINGS: Gallbladder: Cholelithiasis measuring up to 7 mm in dimension. Negative sonographic Murphy's sign. No wall thickening or pericholecystic fluid. Common bile duct: Diameter: 2.9 mm Liver: Hepatic steatosis and hepatomegaly. Portal vein is patent on color Doppler imaging with normal direction of blood flow towards the liver. Other: Trace perihepatic ascites. IMPRESSION: 1. Cholelithiasis without evidence of cholecystitis. 2. Hepatomegaly and hepatic steatosis. Electronically Signed   By: Placido Sou M.D.   On: 12/27/2021 19:45   DG Chest Port 1 View  Result Date: 12/27/2021 CLINICAL DATA:  Chronic cough and temporary weakness x1 day. EXAM: PORTABLE CHEST 1 VIEW COMPARISON:  July 29, 2021 FINDINGS: The heart size and mediastinal contours are within normal limits. Both lungs are clear. The visualized skeletal structures are unremarkable. IMPRESSION: No active disease. Electronically Signed   By: Virgina Norfolk M.D.   On: 12/27/2021 17:58     Vladimir Crofts  12/28/2021, 8:03 AM

## 2021-12-28 NOTE — Progress Notes (Addendum)
NAME:  Laura [name], MRN:  [id number], DOB:  07-03-1960, LOS: 1 ADMISSION DATE:  12/27/2021, CONSULTATION DATE:  12/27/21 REFERRING MD:  Dr. Myrlene Broker / EDP, CHIEF COMPLAINT:  Fall    History of Present Illness:  61 y/o patient who presented to [facility] ER on 9/9 via EMS with reports of weakness and fall.    The patient was admitted in April 2023 with symptomatic anemia, hypotension, SVT, electrolyte disturbances, UTI & conjunctivitis.  He was seen by Hematology during that admission and his anemia was felt to be related to anemia of chronic disease in the setting of liver disease. He was also seen by GI and was planned to follow up with GI as outpatient but it does not appear he was able to follow up.    Laura [name] identifies as female, lives in an apartment.  His cousin assists in his medical care at home.  He reportedly has had progressive weakness.  He attempted to go to the bathroom and fell.  He apparently was on the floor for at least a day.  EMS was activated and found the patient to be hypotensive with SBP in 60's and in SVT.  He was apparently difficult to stick for an IV. The patient reports he continues to smoke 1/2ppd, quit drinking approximately 7 months ago. Per his cousin, he has had episodes of incontinence with moisture associated rash on peri area.  In ER, the patient required O2 to maintain saturations.    Initial ER evaluation notable for recurrent episodes of SVT, hypotension.  Labs -Na 136, K 3.1, Cl 96, glucose 75, BUN 29, Cr 2.14, calcium 8.5, AG 20, albumin 2.3, AST 99 / ALT 36, ammonia 60, TSH 2.8, lactic acid 4, WBC 5.5, Hgb 5, Hct 15.5, MCV 140, platelets 77 & INR 1.5.  CXR negative. COVID negative. Blood & urine cultures pending.   PCCM called for ICU admission.    Pertinent  Medical History  Chronic Back Pain  Tobacco Abuse  ETOH Abuse - reportedly quit Feb 2023 Insomnia  Laminectomy   Significant Hospital Events: Including procedures, antibiotic start and stop dates in  addition to other pertinent events   9/9 Admit s/p mechanical fall   Interim History / Subjective:  This morning remains on levophed  SVT in 150-170s. Given metoprolol 5 with HR 80s  Objective   Blood pressure (!) 74/58, pulse 90, temperature 98.2 F (36.8 C), temperature source Oral, resp. rate 20, weight 63.6 kg, SpO2 95 %.    FiO2 (%):  [100 %] 100 %   Intake/Output Summary (Last 24 hours) at 12/28/2021 0954 Last data filed at 12/28/2021 0900 Gross per 24 hour  Intake 5381.79 ml  Output --  Net 5381.79 ml   Filed Weights   12/27/21 1920 12/27/21 2126  Weight: 68 kg 63.6 kg   Physical Exam: General: Critically ill-appearing, no acute distress, awakens easily to voice HENT: [place], AT, OP clear, MMM Eyes: EOMI, no scleral icterus Respiratory: Clear to auscultation bilaterally.  No crackles, wheezing or rales Cardiovascular: RRR, -M/R/G, no JVD GI: BS+, distendended Extremities:-Edema,-tenderness Neuro: AAO x4, CNII-XII grossly intact Skin: Intact, no rashes or bruising Psych: Normal mood, normal affect  Trop 35>26 Cr 1.8, unchanged AST 99>69 LA 4 T bili 13.6 INR 1.5  Resolved Hospital Problem list     Assessment & Plan:   Acute hypoxemic respiratory failure  Secondary to anemia, hypoperfusion -Wean supplemental O2 for goal >88%  Septic shock. Cannot rule out sepsis or cardiogenic shock. Low  suspicion for active bleed. Sepsis could be from skin wounds from urinary incontinence -Continue Vanc/Cefepime. De-escalate pending culture data -Wean levophed for MAP goal >60 -LR bolus -Trend LA -Wound care  SVT Trop peak 35 -Telemetry -goal K+>4, Mg+ >2  -Vasovagal maneuvers PRN  Symptomatic Anemia  Anemia of Chronic Disease  Coagulopathy  Similar admission in April 2023, felt to be related to chronic disease / liver.  INR 1.5 on admission.  S/p PRBC x 2  -Trend CBC for Hg goal >7 -PPI BID -thiamine, folate IV -GI consulted. Appreciate input. No indication for  endoscopic evaluation  Thrombocytopenia -Trend -Transfuse for goal >10  AKI  Mild Rhabdomyolysis - improving In setting of volume depletion, downtime of ~ 1 day -Trend BMP / urinary output -Replace electrolytes as indicated -Avoid nephrotoxic agents, ensure adequate renal perfusion  Hypokalemia, Hypomagnesemia  -Trend  -Replete as needed  Hx ETOH abuse Hepatomegaly/hepatic steatosis Elevated Bilirubin  Suspect this is end stage liver disease  -Trend LFTs, INR -Thiamine, folic acid  Conjunctivitis  -cipro eye drops   Adult Failure to Thrive  -Palliative Care Consult  -Pt request full code on admission.  Feel he has poor insight into his medical care.  He states if he could not make decisions for himself, he would want [name] [phone] to make decisions for him and his goddaughter   Skin wounds on buttocks, perineum, sacrum, POA -Wound care consult  Best Practice (right click and "Reselect all SmartList Selections" daily)  Diet/type: Regular consistency (see orders) DVT prophylaxis: SCD GI prophylaxis: PPI Lines: N/A Foley:  N/A Code Status:  full code Last date of multidisciplinary goals of care discussion: 9/9, confirmed full code   Critical care time: 60 minutes     The patient is critically ill with SVT and hypotension requiring pressor support and requires high complexity decision making for assessment and support, frequent evaluation and titration of therapies, application of advanced monitoring technologies and extensive interpretation of multiple databases.  Independent Critical Care Time: 60 Minutes.   Mechele Collin, M.D. [facility] Pulmonary/Critical Care Medicine 12/28/2021 9:54 AM   Please see Amion for pager number to reach on-call Pulmonary and Critical Care Team.

## 2021-12-28 NOTE — Procedures (Signed)
Foley Catheter Placement Note  Indications: 61 y.o. adult with progressive weakness and fall found to have hemodynamic instability. Also has multiple excoriations of vulva and medial thighs making urination painful. Need for strict Is/Os. RN staff unable to place foley catheter.  Surgeon: Josph Macho, MD  Assistants: None  Procedure Details  Patient was placed in the supine position, prepped with Betadine and draped in the usual sterile fashion. I attempted to insert a 14 Fr coude catheter but met resistance at the meatal opening. Further inspection showed pinpoint opening. I advanced a sensor wire into the bladder. I then advanced a 5 Fr open ended catheter over the wire. The wire was removed and I was able to aspirate urine confirming placement in bladder. I advanced the sensor wire back in the bladder and removed the open ended catheter. Over the wire, I dilated the urethra from 8Fr to 18Fr using straight urethral dilators. I then advanced a 16Fr council tip 2way catheter over the wire into the bladder. I inserted 10 mL of sterile water into the Foley balloon.  The catheter was attached to a drainage bag and secured with a StatLock.    Complications: None; patient tolerated the procedure well.  Plan:   1. Foley per primary team. 2. Patient is currently receiving IV Cefepime which should provide adequate coverage for this bedside procedure.

## 2021-12-29 ENCOUNTER — Ambulatory Visit: Payer: Medicare Other | Admitting: Internal Medicine

## 2021-12-29 ENCOUNTER — Other Ambulatory Visit: Payer: Self-pay | Admitting: Nurse Practitioner

## 2021-12-29 DIAGNOSIS — A419 Sepsis, unspecified organism: Secondary | ICD-10-CM | POA: Diagnosis not present

## 2021-12-29 DIAGNOSIS — K7030 Alcoholic cirrhosis of liver without ascites: Secondary | ICD-10-CM | POA: Diagnosis not present

## 2021-12-29 DIAGNOSIS — D508 Other iron deficiency anemias: Secondary | ICD-10-CM | POA: Diagnosis not present

## 2021-12-29 DIAGNOSIS — D649 Anemia, unspecified: Secondary | ICD-10-CM | POA: Diagnosis not present

## 2021-12-29 DIAGNOSIS — R6521 Severe sepsis with septic shock: Secondary | ICD-10-CM | POA: Diagnosis not present

## 2021-12-29 LAB — CBC
HCT: 21.3 % — ABNORMAL LOW (ref 36.0–46.0)
HCT: 19.8 % — ABNORMAL LOW (ref 36.0–46.0)
HCT: 23.1 % — ABNORMAL LOW (ref 36.0–46.0)
Hemoglobin: 7.4 g/dL — ABNORMAL LOW (ref 12.0–15.0)
Hemoglobin: 6.8 g/dL — CL (ref 12.0–15.0)
Hemoglobin: 8 g/dL — ABNORMAL LOW (ref 12.0–15.0)
MCH: 34.7 pg — ABNORMAL HIGH (ref 26.0–34.0)
MCH: 34.9 pg — ABNORMAL HIGH (ref 26.0–34.0)
MCH: 33.1 pg (ref 26.0–34.0)
MCHC: 34.7 g/dL (ref 30.0–36.0)
MCHC: 34.3 g/dL (ref 30.0–36.0)
MCHC: 34.6 g/dL (ref 30.0–36.0)
MCV: 100 fL (ref 80.0–100.0)
MCV: 101.5 fL — ABNORMAL HIGH (ref 80.0–100.0)
MCV: 95.5 fL (ref 80.0–100.0)
Platelets: 38 10*3/uL — ABNORMAL LOW (ref 150–400)
Platelets: 28 10*3/uL — CL (ref 150–400)
Platelets: 25 10*3/uL — CL (ref 150–400)
RBC: 2.13 MIL/uL — ABNORMAL LOW (ref 3.87–5.11)
RBC: 1.95 MIL/uL — ABNORMAL LOW (ref 3.87–5.11)
RBC: 2.42 MIL/uL — ABNORMAL LOW (ref 3.87–5.11)
WBC: 7.4 10*3/uL (ref 4.0–10.5)
WBC: 6.8 10*3/uL (ref 4.0–10.5)
WBC: 8.3 10*3/uL (ref 4.0–10.5)
nRBC: 0.8 % — ABNORMAL HIGH (ref 0.0–0.2)
nRBC: 0.7 % — ABNORMAL HIGH (ref 0.0–0.2)
nRBC: 0.7 % — ABNORMAL HIGH (ref 0.0–0.2)

## 2021-12-29 LAB — HEPATIC FUNCTION PANEL
ALT: 25 U/L (ref 0–44)
AST: 44 U/L — ABNORMAL HIGH (ref 15–41)
Albumin: 1.8 g/dL — ABNORMAL LOW (ref 3.5–5.0)
Alkaline Phosphatase: 79 U/L (ref 38–126)
Bilirubin, Direct: 8 mg/dL — ABNORMAL HIGH (ref 0.0–0.2)
Indirect Bilirubin: 6.3 mg/dL — ABNORMAL HIGH (ref 0.3–0.9)
Total Bilirubin: 14.3 mg/dL — ABNORMAL HIGH (ref 0.3–1.2)
Total Protein: 4.8 g/dL — ABNORMAL LOW (ref 6.5–8.1)

## 2021-12-29 LAB — BASIC METABOLIC PANEL
Anion gap: 12 (ref 5–15)
BUN: 34 mg/dL — ABNORMAL HIGH (ref 6–20)
CO2: 23 mmol/L (ref 22–32)
Calcium: 8.5 mg/dL — ABNORMAL LOW (ref 8.9–10.3)
Chloride: 99 mmol/L (ref 98–111)
Creatinine, Ser: 1.43 mg/dL — ABNORMAL HIGH (ref 0.44–1.00)
GFR, Estimated: 42 mL/min — ABNORMAL LOW (ref >60)
Glucose, Bld: 114 mg/dL — ABNORMAL HIGH (ref 70–99)
Potassium: 3.4 mmol/L — ABNORMAL LOW (ref 3.5–5.1)
Sodium: 134 mmol/L — ABNORMAL LOW (ref 135–145)

## 2021-12-29 LAB — GLUCOSE, CAPILLARY
Glucose-Capillary: 128 mg/dL — ABNORMAL HIGH (ref 70–99)
Glucose-Capillary: 108 mg/dL — ABNORMAL HIGH (ref 70–99)
Glucose-Capillary: 135 mg/dL — ABNORMAL HIGH (ref 70–99)
Glucose-Capillary: 163 mg/dL — ABNORMAL HIGH (ref 70–99)
Glucose-Capillary: 119 mg/dL — ABNORMAL HIGH (ref 70–99)
Glucose-Capillary: 102 mg/dL — ABNORMAL HIGH (ref 70–99)

## 2021-12-29 LAB — CORTISOL: Cortisol, Plasma: 38.4 ug/dL

## 2021-12-29 LAB — PROTIME-INR
INR: 1.5 — ABNORMAL HIGH (ref 0.8–1.2)
Prothrombin Time: 18.1 seconds — ABNORMAL HIGH (ref 11.4–15.2)

## 2021-12-29 LAB — PREPARE RBC (CROSSMATCH)

## 2021-12-29 LAB — MAGNESIUM: Magnesium: 1.6 mg/dL — ABNORMAL LOW (ref 1.7–2.4)

## 2021-12-29 MED ORDER — MAGNESIUM SULFATE 4 GM/100ML IV SOLN
4.0000 g | Freq: Once | INTRAVENOUS | Status: AC
  Administered 2021-12-29: 4 g via INTRAVENOUS
  Filled 2021-12-29: qty 100

## 2021-12-29 MED ORDER — POTASSIUM CHLORIDE CRYS ER 20 MEQ PO TBCR: 40.0000 meq | EXTENDED_RELEASE_TABLET | Freq: Once | ORAL | Status: DC

## 2021-12-29 MED ORDER — VITAMIN K1 10 MG/ML IJ SOLN
5.0000 mg | Freq: Once | INTRAVENOUS | Status: AC
  Administered 2021-12-29: 5 mg via INTRAVENOUS
  Filled 2021-12-29: qty 0.5

## 2021-12-29 MED ORDER — POTASSIUM CHLORIDE 10 MEQ/100ML IV SOLN
10.0000 meq | INTRAVENOUS | Status: AC
  Administered 2021-12-29 (×4): 10 meq via INTRAVENOUS
  Filled 2021-12-29 (×4): qty 100

## 2021-12-29 MED ORDER — GERHARDT'S BUTT CREAM
TOPICAL_CREAM | Freq: Two times a day (BID) | CUTANEOUS | Status: DC
  Administered 2022-01-02 – 2022-01-07 (×3): 1 via TOPICAL
  Filled 2021-12-29 (×5): qty 1

## 2021-12-29 MED ORDER — SODIUM CHLORIDE 0.9% FLUSH
10.0000 mL | Freq: Two times a day (BID) | INTRAVENOUS | Status: DC
  Administered 2021-12-29 – 2022-01-03 (×11): 10 mL
  Administered 2022-01-03: 20 mL
  Administered 2022-01-04 – 2022-01-05 (×4): 10 mL
  Administered 2022-01-06: 30 mL
  Administered 2022-01-06 – 2022-01-08 (×3): 10 mL
  Administered 2022-01-08: 20 mL
  Administered 2022-01-09: 10 mL
  Administered 2022-01-09: 20 mL
  Administered 2022-01-09: 10 mL
  Administered 2022-01-10: 30 mL
  Administered 2022-01-10 – 2022-01-22 (×15): 10 mL

## 2021-12-29 MED ORDER — MAGNESIUM SULFATE 4 GM/100ML IV SOLN
4.0000 g | Freq: Once | INTRAVENOUS | Status: DC
  Filled 2021-12-29: qty 100

## 2021-12-29 MED ORDER — MIDODRINE HCL 5 MG PO TABS
10.0000 mg | ORAL_TABLET | Freq: Three times a day (TID) | ORAL | Status: DC
  Administered 2021-12-29 – 2021-12-30 (×3): 10 mg via ORAL
  Filled 2021-12-29 (×3): qty 2

## 2021-12-29 MED ORDER — LACTATED RINGERS IV BOLUS
1000.0000 mL | Freq: Once | INTRAVENOUS | Status: AC
  Administered 2021-12-29: 1000 mL via INTRAVENOUS

## 2021-12-29 MED ORDER — SODIUM CHLORIDE 0.9% IV SOLUTION: Freq: Once | INTRAVENOUS | Status: AC

## 2021-12-29 MED ORDER — PANTOPRAZOLE SODIUM 40 MG IV SOLR
40.0000 mg | Freq: Two times a day (BID) | INTRAVENOUS | Status: DC
  Administered 2021-12-29 – 2021-12-30 (×2): 40 mg via INTRAVENOUS
  Filled 2021-12-29 (×2): qty 10

## 2021-12-29 MED ORDER — SODIUM CHLORIDE 0.9% FLUSH
10.0000 mL | INTRAVENOUS | Status: DC | PRN
  Administered 2022-01-13 – 2022-01-19 (×3): 10 mL

## 2021-12-29 MED ORDER — MEDIHONEY WOUND/BURN DRESSING EX PSTE
1.0000 | PASTE | Freq: Every day | CUTANEOUS | Status: DC
  Administered 2021-12-29 – 2022-01-22 (×23): 1 via TOPICAL
  Filled 2021-12-29 (×6): qty 44

## 2021-12-29 NOTE — Consult Note (Addendum)
WOC Nurse Consult Note: Reason for Consult: Consult requested for buttocks.  Family member at the bedside states patient was found down for an unknown period of time in the bathroom.  Shape and appearance of wounds to bilat buttocks are consistent with sitting on a toilet seat for a prolonged period of time.  Pt is critically ill in ICU and not verbalizing.  Wound type: Bilat buttocks with crescent-shaped Dark red-purple deep tissue pressure injuries.  Each side is 10X2X.1cm, beginning to evolve to red moist patchy areas of Stage 2 pressure injuries in scattered locations.  Right buttock with Unstagable area in the center of the wound is 30% yellow slough. Bilat buttocks/sacrum/inner groin is red, moist, macerated and painful to the touch.  Appearance is consistent with moisture associated skin damage.  ICD-10 CM Codes for Irritant Dermatitis L24A2 - Due to fecal, urinary or dual incontinence L30.4  - Erythema intertrigo: genital/thigh intertrigo.   Pt is frequently incontinent of watery green stools and it would become trapped underneath dressings if applied. It will be difficult to promote healing and keep the location from remaining moist and soiled. Pt is on a low airloss mattress to reduce pressure. Family member at the bedside assessed the wound appearance and discussed plan of care.  Discussed with them that Deep tissue presure injuries frequently evolve into full thickness tissue loss within 7-10 days and wound may continue to decline despite topical treatment, they verbalized understanding. Pressure Injury POA: Yes Dressing procedure/placement/frequency: Topical treatment orders provided for bedside nurses to perform as follows to assist with removal of nonviable tissue and promote healing:  1. Apply Medihoney to right buttock(yellow areas) Q day, then cover with small foam dressing.  Change foam dressing Q 3 days or PRN soiling. 2. Apply Gerhardts cream to bilat buttocks BID and PRN  when  turning and cleaning (except where foam dressing and Medihoney is)  3. LEAVE FOAM DRESSING OFF other areas of sacrum/buttocks to avoid trapping stool underneath the dressings. Please re-consult if further assistance is needed.  Thank-you,  Cammie Mcgee MSN, RN, CWOCN, [place], CNS [phone]

## 2021-12-29 NOTE — Progress Notes (Addendum)
PICC order noted. GFR of 32. Christene Lye RN notified of this lab value and to contact MD for order to place PICC with that lab value.

## 2021-12-29 NOTE — Progress Notes (Addendum)
Attending physician's note   I have taken a history, reviewed the chart, and examined the patient. I performed a substantive portion of this encounter, including complete performance of at least one of the key components, in conjunction with the APP. I agree with the APP's note, impression, and recommendations with my edits.   Good serologic response to RBC transfusion.  H/H stable and no overt bleeding.  Discussed with patient and family member at bedside.  No plan for repeat endoscopic evaluation at this juncture.  Rickita Forstner, DO, FACG [phone] office                Daily Rounding Note  12/29/2021, 9:22 AM  LOS: 2 days   SUBJECTIVE:   Chief complaint:   acute on chronic anemia   C/O back pain.  Weakness persists but better.  PICC placed today.  . Contiues on Levophed at 6 mcg/min    OBJECTIVE:         Vital signs in last 24 hours:    Temp:  [98.1 F (36.7 C)-98.3 F (36.8 C)] 98.3 F (36.8 C) (09/11 0700) Pulse Rate:  [74-103] 87 (09/11 0800) Resp:  [11-24] 18 (09/11 0800) BP: (74-130)/(46-120) 104/82 (09/11 0800) SpO2:  [93 %-99 %] 97 % (09/11 0800) Last BM Date :  (PTA) Filed Weights   12/27/21 1920 12/27/21 2126  Weight: 68 kg 63.6 kg   General: looks very chronically ill   Heart: SR in 90s Chest: no labored breathing or cough.  Weak voice Abdomen: large, NT, soft.  Active BS  Extremities: + pedal edema bil Neuro/Psych:  oriented x 3.  Resting had tremors but no asterixis.  Moves all 4 limbs.    Intake/Output from previous day: 09/10 0701 - 09/11 0700 In: 1997.7 [I.V.:635.2; IV Piggyback:1362.4] Out: 200 [Urine:200]  Intake/Output this shift: Total I/O In: -  Out: 25 [Urine:25]  Lab Results: Recent Labs    12/28/21 1039 12/28/21 1850 12/28/21 2307  WBC 4.9 7.4 7.4  HGB 8.0* 7.6* 7.4*  HCT 22.5* 22.1* 21.3*  PLT 53* 45* 38*   BMET Recent Labs    12/27/21 1650 12/27/21 1751  12/28/21 0321 12/29/21 0310  NA 137 136 134* 134*  K 3.0* 3.1* 3.8 3.4*  CL 95* 96* 97* 99  CO2 22  --  22 23  GLUCOSE 82 75 98 114*  BUN 31* 29* 30* 34*  CREATININE 2.14* 1.90* 1.80* 1.43*  CALCIUM 8.5*  --  8.2* 8.5*   LFT Recent Labs    12/27/21 1650 12/28/21 0321 12/28/21 1039  PROT 5.7* 4.8*  --   ALBUMIN 2.3* 1.9*  --   AST 99* 69*  --   ALT 36 33  --   ALKPHOS 114 91  --   BILITOT 11.8* 13.6* 14.6*  BILIDIR  --   --  8.2*  IBILI  --   --  6.4*   PT/INR Recent Labs    12/27/21 1650 12/29/21 0310  LABPROT 18.1* 18.1*  INR 1.5* 1.5*   Hepatitis Panel No results for input(s): "HEPBSAG", "HCVAB", "HEPAIGM", "HEPBIGM" in the last 72 hours.  Studies/Results: [place] EKG SITE RITE  Result Date: 12/28/2021 If Site Rite image not attached, placement could not be confirmed due to current cardiac rhythm.  ECHOCARDIOGRAM COMPLETE  Result Date: 12/28/2021    ECHOCARDIOGRAM REPORT   Patient Name:   Laura [name] Date of Exam: 12/28/2021 Medical Rec #:  [id number]  Height:  67.0 in Accession #:    [id number] Weight:       140.2 lb Date of Birth:  14-Sep-1960  BSA:          1.739 m Patient Age:    61 years   BP:           95/61 mmHg Patient Gender: F          HR:           87 bpm. Exam Location:  Inpatient Procedure: 2D Echo, Color Doppler and Cardiac Doppler Indications:    Shock  History:        Patient has prior history of Echocardiogram examinations, most                 recent 07/30/2021.  Sonographer:    Gaynell Face Referring Phys: [id number] CHI JANE ELLISON IMPRESSIONS  1. Left ventricular ejection fraction, by estimation, is 60 to 65%. The left ventricle has normal function. The left ventricle has no regional wall motion abnormalities. Left ventricular diastolic parameters were normal.  2. Right ventricular systolic function is normal. The right ventricular size is normal. There is mildly elevated pulmonary artery systolic pressure. The estimated right ventricular systolic  pressure is 37.6 mmHg.  3. The mitral valve is normal in structure. Trivial mitral valve regurgitation.  4. The aortic valve is tricuspid. Aortic valve regurgitation is not visualized. No aortic stenosis is present.  5. The inferior vena cava is normal in size with greater than 50% respiratory variability, suggesting right atrial pressure of 3 mmHg. Comparison(s): No significant change since prior TTE in 07/2021. FINDINGS  Left Ventricle: Left ventricular ejection fraction, by estimation, is 60 to 65%. The left ventricle has normal function. The left ventricle has no regional wall motion abnormalities. The left ventricular internal cavity size was normal in size. There is  no left ventricular hypertrophy. Left ventricular diastolic parameters were normal. Right Ventricle: The right ventricular size is normal. No increase in right ventricular wall thickness. Right ventricular systolic function is normal. There is mildly elevated pulmonary artery systolic pressure. The tricuspid regurgitant velocity is 2.94  m/s, and with an assumed right atrial pressure of 3 mmHg, the estimated right ventricular systolic pressure is 37.6 mmHg. Left Atrium: Left atrial size was normal in size. Right Atrium: Right atrial size was normal in size. Pericardium: There is no evidence of pericardial effusion. Mitral Valve: The mitral valve is normal in structure. Trivial mitral valve regurgitation. Tricuspid Valve: The tricuspid valve is normal in structure. Tricuspid valve regurgitation is mild. Aortic Valve: The aortic valve is tricuspid. Aortic valve regurgitation is not visualized. No aortic stenosis is present. Aortic valve mean gradient measures 4.0 mmHg. Aortic valve peak gradient measures 6.6 mmHg. Aortic valve area, by VTI measures 2.37 cm. Pulmonic Valve: The pulmonic valve was normal in structure. Pulmonic valve regurgitation is trivial. Aorta: The aortic root is normal in size and structure. Venous: The inferior vena cava is  normal in size with greater than 50% respiratory variability, suggesting right atrial pressure of 3 mmHg. IAS/Shunts: The atrial septum is grossly normal.  LEFT VENTRICLE PLAX 2D LVIDd:         3.90 cm   Diastology LVIDs:         2.60 cm   LV e' medial:    7.51 cm/s LV PW:         1.30 cm   LV E/e' medial:  9.8 LV IVS:        1.00  cm   LV e' lateral:   11.60 cm/s LVOT diam:     2.00 cm   LV E/e' lateral: 6.4 LV SV:         50 LV SV Index:   29 LVOT Area:     3.14 cm  RIGHT VENTRICLE TAPSE (M-mode): 1.9 cm LEFT ATRIUM             Index        RIGHT ATRIUM          Index LA diam:        2.60 cm 1.50 cm/m   RA Area:     9.97 cm LA Vol (A2C):   31.2 ml 17.94 ml/m  RA Volume:   21.50 ml 12.36 ml/m LA Vol (A4C):   37.6 ml 21.62 ml/m LA Biplane Vol: 34.6 ml 19.90 ml/m  AORTIC VALVE AV Area (Vmax):    2.58 cm AV Area (Vmean):   2.33 cm AV Area (VTI):     2.37 cm AV Vmax:           128.00 cm/s AV Vmean:          86.000 cm/s AV VTI:            0.211 m AV Peak Grad:      6.6 mmHg AV Mean Grad:      4.0 mmHg LVOT Vmax:         105.00 cm/s LVOT Vmean:        63.900 cm/s LVOT VTI:          0.159 m LVOT/AV VTI ratio: 0.75  AORTA Ao Root diam: 3.50 cm Ao Asc diam:  3.00 cm MITRAL VALVE               TRICUSPID VALVE MV Area (PHT): 3.99 cm    TR Peak grad:   34.6 mmHg MV Decel Time: 190 msec    TR Vmax:        294.00 cm/s MV E velocity: 73.90 cm/s MV A velocity: 65.00 cm/s  SHUNTS MV E/A ratio:  1.14        Systemic VTI:  0.16 m                            Systemic Diam: 2.00 cm Laurance Flatten MD Electronically signed by Laurance Flatten MD Signature Date/Time: 12/28/2021/1:01:43 PM    Final    US LIVER DOPPLER  Result Date: 12/27/2021 CLINICAL DATA:  Right upper quadrant pain EXAM: DUPLEX ULTRASOUND OF LIVER TECHNIQUE: Color and duplex Doppler ultrasound was performed to evaluate the hepatic in-flow and out-flow vessels. COMPARISON:  MRCP 07/30/2021 FINDINGS: Liver: Hepatomegaly and hepatic steatosis. No focal  lesion, mass or intrahepatic biliary ductal dilatation. Main Portal Vein size: 1.0 cm Portal Vein Velocities Main Prox:  24 cm/sec Main Mid: 21 cm/sec Main Dist:  24 cm/sec Right: Unable to obtain due to patient cooperation Left: Unable to obtain due to patient cooperation Hepatic Vein Velocities Right:  50 cm/sec Middle:  76 cm/sec Left:  26 cm/sec IVC: Present and patent with normal respiratory phasicity. Hepatic Artery Velocity:  127 cm/sec Splenic Vein Velocity:  Unable to obtain Spleen: 9.2 x 6.9 x 4.8 cm with a total volume of 157.6 cm^3 (411 cm^3 is upper limit normal) Portal Vein Occlusion/Thrombus: No Splenic Vein Occlusion/Thrombus: No Ascites: Trace perihepatic ascites Varices: None IMPRESSION: 1. The portal and hepatic veins are patent where visualized with antegrade flow. 2.  Hepatomegaly and hepatic steatosis. Electronically Signed   By: Minerva Fester M.D.   On: 12/27/2021 19:52   US Abdomen Limited RUQ (LIVER/GB)  Result Date: 12/27/2021 CLINICAL DATA:  Right upper quadrant pain EXAM: ULTRASOUND ABDOMEN LIMITED RIGHT UPPER QUADRANT COMPARISON:  MRCP 07/30/2021 and CT abdomen and pelvis 06/29/2021 FINDINGS: Gallbladder: Cholelithiasis measuring up to 7 mm in dimension. Negative sonographic Murphy's sign. No wall thickening or pericholecystic fluid. Common bile duct: Diameter: 2.9 mm Liver: Hepatic steatosis and hepatomegaly. Portal vein is patent on color Doppler imaging with normal direction of blood flow towards the liver. Other: Trace perihepatic ascites. IMPRESSION: 1. Cholelithiasis without evidence of cholecystitis. 2. Hepatomegaly and hepatic steatosis. Electronically Signed   By: Minerva Fester M.D.   On: 12/27/2021 19:45   DG Chest Port 1 View  Result Date: 12/27/2021 CLINICAL DATA:  Chronic cough and temporary weakness x1 day. EXAM: PORTABLE CHEST 1 VIEW COMPARISON:  July 29, 2021 FINDINGS: The heart size and mediastinal contours are within normal limits. Both lungs are clear. The  visualized skeletal structures are unremarkable. IMPRESSION: No active disease. Electronically Signed   By: Aram Candela M.D.   On: 12/27/2021 17:58    ASSESMENT:     Anemia.  Acute on chronic. Progressive weakness. PRBCin [id number].  Folate deficient in 07/2021 but iron, ferritin, B12 ok then. Prior hx b12 deficiency.  Po iron and folate on home  med list but not determined if she was [place] these along w all other meds.   Admit Hgb 5.4.Marland KitchenMarland Kitchen 2 PRBC.. 8.2 .. 7.4.       FOBT +.  No overt bleeding (melena, CGE, BPR) reported though  not clear if pt, cousin are reliable historians.   06/2021 EGD: Normal study, duodenal biopsies showed partial blunting of villi, mild inflammation but no in creased intraepithelial lymphocytes, biopsy not diagnostic for celiac disease, dysplasia, malignancy. 06/2021 colonoscopy: Normal.  Random biopsies showed no pathology.    ETOH use disorder.  Pt reports last use early 2023.  Labs suspicious for ongoing consumption.     Pancytopenia w profound thrombocytopenia.   Platelets 77.. 38.  No splenomegaly on MR, CT in spring 2023.      ETOH hepatitis.  Severe hepatomegaly and severe hepatic steatosis on MR 07/30/2021.  No definitive changes of cirrhosis.  MELD-Na 27.  DF < 32.   No indication for steroids.   Liver Doppler studies with patent, antegrade portal and hepatic vein flow.  Hepatomegaly, hepatic steatosis. Abdominal ultrasound with hepatomegaly, hepatic steatosis.  Cholelithiasis but no cholecystitis.  Normal PV Dopplers.    SVT, hypotenision.  Levophed in place.      AKI, mild Rhabdo. Elevated CK.   Labs improving.  GFR 32   Prot cal malnutrition, mild.      Hyponatremia at 134.       Hypokalemia  at 3.4.       Elevated lactic acid improved   PLAN     At present, no plans to repeat EGD.Colonoscopy or pursue VCE, enterscopy.  Dr Barron Alvine will see pt later today.      Jennye Moccasin  12/29/2021, 9:22 AM Phone [phone]

## 2021-12-29 NOTE — Progress Notes (Signed)
eLink Physician-Brief Progress Note Patient Name: Laura [name] DOB: 14-Mar-1961 MRN: [id number]   Date of Service  12/29/2021  HPI/Events of Note  Notified of hgb at 7.4, platelets 38 <-- 45. No signs of active bleeding at this time.   eICU Interventions  Continue to monitor.  No indication for transfusion.  Pt's levophed requirements are going down.  Continue to titrate levophed as able to maintain MAP >65.  PICC line in the AM.       Intervention Category Intermediate Interventions: Other:  Larinda Buttery 12/29/2021, 12:33 AM

## 2021-12-29 NOTE — Progress Notes (Signed)
Potassium and magnesium replaced. Time of replacement pushed til 10AM to secure vascular access. 1 IV available at this time and pressor is running into that. PICC ordered for today.

## 2021-12-29 NOTE — Progress Notes (Signed)
NAME:  Laura [name], MRN:  [id number], DOB:  Feb 02, 1961, LOS: 2 ADMISSION DATE:  12/27/2021, CONSULTATION DATE:  12/27/21 REFERRING MD:  Dr. Myrlene Broker / EDP, CHIEF COMPLAINT:  Fall    History of Present Illness:  61 y/o patient who presented to [facility] ER on 9/9 via EMS with reports of weakness and fall.    The patient was admitted in April 2023 with symptomatic anemia, hypotension, SVT, electrolyte disturbances, UTI & conjunctivitis.  He was seen by Hematology during that admission and his anemia was felt to be related to anemia of chronic disease in the setting of liver disease. He was also seen by GI and was planned to follow up with GI as outpatient but it does not appear he was able to follow up.    Laura [name] identifies as female, lives in an apartment.  His cousin assists in his medical care at home.  He reportedly has had progressive weakness.  He attempted to go to the bathroom and fell.  He apparently was on the floor for at least a day.  EMS was activated and found the patient to be hypotensive with SBP in 60's and in SVT.  He was apparently difficult to stick for an IV. The patient reports he continues to smoke 1/2ppd, quit drinking approximately 7 months ago. Per his cousin, he has had episodes of incontinence with moisture associated rash on peri area.  In ER, the patient required O2 to maintain saturations.    Initial ER evaluation notable for recurrent episodes of SVT, hypotension.  Labs -Na 136, K 3.1, Cl 96, glucose 75, BUN 29, Cr 2.14, calcium 8.5, AG 20, albumin 2.3, AST 99 / ALT 36, ammonia 60, TSH 2.8, lactic acid 4, WBC 5.5, Hgb 5, Hct 15.5, MCV 140, platelets 77 & INR 1.5.  CXR negative. COVID negative. Blood & urine cultures pending.   PCCM called for ICU admission.    Pertinent  Medical History  Chronic Back Pain  Tobacco Abuse  ETOH Abuse - reportedly quit Feb 2023 Insomnia  Laminectomy   Significant Hospital Events: Including procedures, antibiotic start and stop dates in  addition to other pertinent events   9/9 Admit s/p mechanical fall   Interim History / Subjective:  This morning remains on levophed   Objective   Blood pressure 107/64, pulse 79, temperature 98.1 F (36.7 C), temperature source Oral, resp. rate 16, weight 63.6 kg, SpO2 98 %.        Intake/Output Summary (Last 24 hours) at 12/29/2021 0719 Last data filed at 12/29/2021 0631 Gross per 24 hour  Intake 1997.68 ml  Output 200 ml  Net 1797.68 ml    Filed Weights   12/27/21 1920 12/27/21 2126  Weight: 68 kg 63.6 kg   Physical Exam: General: Critically ill, NAD, arouses to voice HENT: NCAT, MMM Eyes: EOMI Respiratory: CTAB, no w/r/c, no retractions on [place]  Cardiovascular: RRR no m/r/g GI: Mildly distended, BS+, nontender Extremities: no edema, mild tenderness on left leg when palpation-no swelling/wamrth/erythema Neuro: A&Ox4 Skin:No rashes/bruises Psych: appropriate  Cr 1.43 from 1.8 Hg 7.4 from 7.6  Resolved Hospital Problem list     Assessment & Plan:   Acute hypoxemic respiratory failure  Secondary to anemia, hypoperfusion. On 6 L currently -Wean supplemental O2 for goal >88%  Septic shock. Cannot rule out sepsis or cardiogenic shock. Low suspicion for active bleed. Sepsis could be from skin wounds or urinary incontinence -Continue Vanc/Cefepime. De-escalate pending culture data -Wean levophed for MAP goal >60 -Trend  LA -Wound care  SVT Trop peak 35. HR has been 70/80s now -Telemetry -goal K+>4, Mg+ >2  -Vasovagal maneuvers PRN  Symptomatic Anemia  Anemia of Chronic Disease  Coagulopathy  Similar admission in April 2023, felt to be related to chronic disease / liver.  INR 1.5 on admission.  S/p PRBC x 2  -Trend CBC for Hg goal >7 -PPI BID -thiamine, folate IV -GI consulted. Appreciate input. No indication for endoscopic evaluation  Thrombocytopenia -Trend -Transfuse for goal >10  AKI  Mild Rhabdomyolysis - improving In setting of volume depletion,  downtime of ~ 1 day -Trend BMP / urinary output -Replace electrolytes as indicated -Avoid nephrotoxic agents, ensure adequate renal perfusion  Hypokalemia, Hypomagnesemia  -Trend  -Replete as needed  Hx ETOH abuse Hepatomegaly/hepatic steatosis Elevated Bilirubin  Suspect this is end stage liver disease  -Trend LFTs, INR -Thiamine, folic acid  Conjunctivitis  -cipro eye drops   Adult Failure to Thrive  -Palliative Care Consult  -Pt request full code on admission.  Feel he has poor insight into his medical care.  He states if he could not make decisions for himself, he would want Laura [name] [phone] to make decisions for him and his goddaughter   Skin wounds on buttocks, perineum, sacrum, POA -Wound care consult  Best Practice (right click and "Reselect all SmartList Selections" daily)  Diet/type: Regular consistency (see orders) DVT prophylaxis: SCD GI prophylaxis: PPI Lines: N/A Foley:  N/A Code Status:  full code Last date of multidisciplinary goals of care discussion: 9/9, confirmed full code   Critical care time: 60 minutes    Levin Erp, MD [facility] PGY-2 [place] [facility] 12/29/2021 7:19 AM   Please see Amion for pager number to reach on-call Pulmonary and Critical Care Team.

## 2021-12-29 NOTE — Significant Event (Signed)
Hgb this morning of of 6.8. Given 1 u of PRBC with repeat H/H this afternoon of 8.0. No obvious source of active bleed. Will continue to monitor and transfuse as needed.

## 2021-12-29 NOTE — TOC Initial Note (Signed)
Transition of Care [facility]) - Initial/Assessment Note    Patient Details  Name: Laura [name] MRN: [id number] Date of Birth: July 13, 1960  Transition of Care [facility]) CM/SW Contact:    Tom-Johnson, Hershal Coria, RN Phone Number: 12/29/2021, 3:52 PM  Clinical Narrative:                  CM spoke with patient at bedside about needs for post hospital transition. Admitted for Anemia. Found down at home. States he has been having frequent falls. Currently on 2L O2,does not use home O2. Has a Foley cath placed by Urology. Hgb on admit was 5.4, 2 unit PRBC given, currently at 6.8. GI following.  From home with significant other, Bonita Quin who was at bedside. Does not have children. Currently on disability. Has a cane, walker and shower seat at home.  PCP is Janyth Contes, Janene Harvey, NP and uses [facility] on [address].  Recently active with home health disciplines on 08/29/21 with [facility]. States she would like to resume care if recommended.  No TOC needs or recommendations noted at this time. CM will continue to follow as patient progresses with care towards discharge.   Barriers to Discharge: Continued Medical Work up   Patient Goals and CMS Choice Patient states their goals for this hospitalization and ongoing recovery are:: To return home CMS Medicare.gov Compare Post Acute Care list provided to:: Patient    Expected Discharge Plan and Services     Discharge Planning Services: CM Consult   Living arrangements for the past 2 months: Apartment                                      Prior Living Arrangements/Services Living arrangements for the past 2 months: Apartment Lives with:: Significant Other Patient language and need for interpreter reviewed:: Yes Do you feel safe going back to the place where you live?: Yes      Need for Family Participation in Patient Care: Yes (Comment) Care giver support system in place?: Yes (comment)   Criminal Activity/Legal Involvement Pertinent  to Current Situation/Hospitalization: No - Comment as needed  Activities of Daily Living      Permission Sought/Granted Permission sought to share information with : Case Manager, Family Supports Permission granted to share information with : Yes, Verbal Permission Granted              Emotional Assessment Appearance:: Appears stated age Attitude/Demeanor/Rapport: Engaged, Gracious Affect (typically observed): Accepting, Appropriate, Calm, Hopeful, Pleasant Orientation: : Oriented to Self, Oriented to Place, Oriented to  Time, Oriented to Situation Alcohol / Substance Use: Not Applicable Psych Involvement: No (comment)  Admission diagnosis:  Hyperbilirubinemia [E80.6] SVT (supraventricular tachycardia) (HCC) [I47.1] Anemia [D64.9] AKI (acute kidney injury) (HCC) [N17.9] Symptomatic anemia [D64.9] Patient Active Problem List   Diagnosis Date Noted   Alcoholic cirrhosis of liver without ascites (HCC)    Pressure injury of skin 12/28/2021   Anemia 12/27/2021   Conjunctivitis 08/01/2021   Vitamin D deficiency 08/01/2021   Erythema of abdominal wall 08/01/2021   Hyperbilirubinemia 07/30/2021   Elevated alkaline phosphatase level 07/30/2021   SVT (supraventricular tachycardia) (HCC) 07/30/2021   Hypocalcemia 07/30/2021   Edema 07/30/2021   UTI (urinary tract infection) 07/30/2021   Folate deficiency 07/30/2021   Exertional shortness of breath    Symptomatic anemia 07/29/2021   Hypotension 06/30/2021   Hypokalemia 06/30/2021   Normocytic anemia 06/30/2021   Hypomagnesemia  06/30/2021   Lactic acidosis 06/29/2021   Estrogen deficiency 09/30/2018   Macrocytic anemia 04/12/2018   Tobacco abuse 04/12/2018   Essential hypertension 04/12/2018   Body mass index (BMI) of 37.0-37.9 in adult 04/12/2018   Obesity (BMI 30.0-34.9) 09/29/2016   Low back pain 10/07/2015   Chronic night sweats 03/03/2015   Generalized anxiety disorder 06/07/2014   Allergic rhinitis 03/02/2013    Panic attack 09/05/2012   Insomnia 07/28/2012   Depression 07/28/2012   OA (osteoarthritis) of knee 07/28/2012   PCP:  Sharon Seller, NP Pharmacy:   [id number] - [place], [place] - [address] AT [facility] OF [address] [place] [address] Phone: [phone] Fax: [phone]  [facility] #[id number] [place], [place] - [address] AT [facility] OF [address] & [facility] [place] [address] Phone: [phone] Fax: [phone]  [facility] ([facility]) - [place], [place] - [address] [place] [address] Phone: [phone] Fax: [phone]     Social Determinants of Health (SDOH) Interventions    Readmission Risk Interventions    07/02/2021   11:32 AM  Readmission Risk Prevention Plan  Post Dischage Appt Complete  Medication Screening Complete  Transportation Screening Complete

## 2021-12-29 NOTE — Progress Notes (Signed)
Peripherally Inserted Central Catheter Placement  The IV Nurse has discussed with the patient and/or persons authorized to consent for the patient, the purpose of this procedure and the potential benefits and risks involved with this procedure.  The benefits include less needle sticks, lab draws from the catheter, and the patient may be discharged home with the catheter. Risks include, but not limited to, infection, bleeding, blood clot (thrombus formation), and puncture of an artery; nerve damage and irregular heartbeat and possibility to perform a PICC exchange if needed/ordered by physician.  Alternatives to this procedure were also discussed.  Bard Power PICC patient education guide, fact sheet on infection prevention and patient information card has been provided to patient /or left at bedside.    PICC Placement Documentation  PICC Triple Lumen 12/29/21 Left Basilic 44 cm 0 cm (Active)  Indication for Insertion or Continuance of Line Vasoactive infusions 12/29/21 0900  Exposed Catheter (cm) 0 cm 12/29/21 0900  Site Assessment Clean, Dry, Intact 12/29/21 0900  Lumen #1 Status Flushed;Saline locked;Blood return noted 12/29/21 0900  Lumen #2 Status Flushed;Saline locked;Blood return noted 12/29/21 0900  Lumen #3 Status Flushed;Saline locked;Blood return noted 12/29/21 0900  Dressing Type Transparent;Securing device 12/29/21 0900  Dressing Status Antimicrobial disc in place 12/29/21 0900  Safety Lock Not Applicable 12/29/21 0900  Line Care Connections checked and tightened 12/29/21 0900  Line Adjustment (NICU/IV Team Only) No 12/29/21 0900  Dressing Intervention New dressing 12/29/21 0900  Dressing Change Due 01/05/22 12/29/21 0900       Laura [name]  Laura [name] 12/29/2021, 9:13 AM

## 2021-12-29 NOTE — Evaluation (Signed)
Clinical/Bedside Swallow Evaluation Patient Details  Name: Laura [name] MRN: [id number] Date of Birth: Aug 31, 1960  Today's Date: 12/29/2021 Time: SLP Start Time (ACUTE ONLY): 1126 SLP Stop Time (ACUTE ONLY): 1142 SLP Time Calculation (min) (ACUTE ONLY): 16 min  Past Medical History:  Past Medical History:  Diagnosis Date   Acute bronchitis    Cervical high risk HPV (human papillomavirus) test positive 12/2017   Negative subtype 16, 18/45   Chronic back pain    Complication of anesthesia    "didn't wake up"   Cough    Insomnia, unspecified    Other malaise and fatigue    Other specified diseases of blood and blood-forming organs(289.89)    Tobacco use disorder    Past Surgical History:  Past Surgical History:  Procedure Laterality Date   BIOPSY  07/01/2021   Procedure: BIOPSY;  Surgeon: Jeani Hawking, MD;  Location: [facility] ENDOSCOPY;  Service: Gastroenterology;;  EGD and COLON   COLONOSCOPY WITH PROPOFOL N/A 07/01/2021   Procedure: COLONOSCOPY WITH PROPOFOL;  Surgeon: Jeani Hawking, MD;  Location[facility] ENDOSCOPY;  Service: Gastroenterology;  Laterality: N/A;   ESOPHAGOGASTRODUODENOSCOPY N/A 03/17/2013   Procedure: ESOPHAGOGASTRODUODENOSCOPY (EGD);  Surgeon: Meryl Dare, MD;  Location: [facility] ENDOSCOPY;  Service: Endoscopy;  Laterality: N/A;   ESOPHAGOGASTRODUODENOSCOPY (EGD) WITH PROPOFOL N/A 07/01/2021   Procedure: ESOPHAGOGASTRODUODENOSCOPY (EGD) WITH PROPOFOL;  Surgeon: Jeani Hawking, MD;  Location: [facility] ENDOSCOPY;  Service: Gastroenterology;  Laterality: N/A;   FRACTURE SURGERY     Laminectomy and foraminotomy at L5-S1    12/12/2019   TONSILLECTOMY     HPI:  Pt is a 61 y.o. female that identifies as a female, with a past medical history significant for hypertension, hyperlipidemia, acute bronchitis, and recurrent severe anemia. Pt presented to ER with altered mental status and unresponsiveness from a fall. Pt denied headache or neck pain upon intake but reported some cough/fatigue.     Assessment / Plan / Recommendation  Clinical Impression   Pt was awake but distractible, with wife at bedside. Pt reports no changes in his swallow function. A baseline cough was noted. Oral motor assessment appeared WFL, however, pt unable to follow commands assessing lingual ROM. Pt is edentulous on top. He states that he wears top dentures, however, they are broken. Pt given trials of thin liquids and puree consistency, and displayed no overt s/sx of aspiration across all trials. Regular consistency not administered per clear liquid diet order from GI MD. Recommend pt continue clear liquid diet and advance to full regular/thin liquids per GI MD recommendations. No further S/T required at this time.   SLP Visit Diagnosis: Dysphagia, unspecified (R13.10)    Aspiration Risk  No limitations    Diet Recommendation Other (Comment);Thin liquid (Clear liquids per GI MD, upgrade per GI MD)   Liquid Administration via: Straw;Cup Medication Administration: Whole meds with liquid Supervision: Patient able to self feed Compensations: Slow rate;Small sips/bites Postural Changes: Seated upright at 90 degrees    Other  Recommendations Oral Care Recommendations: Oral care BID    Recommendations for follow up therapy are one component of a multi-disciplinary discharge planning process, led by the attending physician.  Recommendations may be updated based on patient status, additional functional criteria and insurance authorization.  Follow up Recommendations No SLP follow up      Assistance Recommended at Discharge None  Functional Status Assessment Patient has not had a recent decline in their functional status  Frequency and Duration  Prognosis        Swallow Study   General Date of Onset: 12/27/21 HPI: Pt is a 61 y.o. female that identifies as a female, with a past medical history significant for hypertension, hyperlipidemia, acute bronchitis, and recurrent severe anemia. Pt  presented to ER with altered mental status and unresponsiveness from a fall. Pt denied headache or neck pain upon intake but reported some cough/fatigue. Type of Study: Bedside Swallow Evaluation Previous Swallow Assessment:  (none) Diet Prior to this Study: Other (Comment) (clear liquids) Temperature Spikes Noted: No Respiratory Status: Nasal cannula History of Recent Intubation: No Behavior/Cognition: Cooperative;Pleasant mood;Alert;Distractible Oral Cavity Assessment: Within Functional Limits Oral Care Completed by SLP: No Oral Cavity - Dentition: Dentures, top (Pt reports top dentures are broken) Vision: Functional for self-feeding Self-Feeding Abilities: Able to feed self Patient Positioning: Upright in bed Baseline Vocal Quality: Normal Volitional Cough: Strong Volitional Swallow: Able to elicit    Oral/Motor/Sensory Function Overall Oral Motor/Sensory Function: Within functional limits   Ice Chips Ice chips: Not tested   Thin Liquid Thin Liquid: Within functional limits Presentation: Straw    Nectar Thick Nectar Thick Liquid: Not tested   Honey Thick Honey Thick Liquid: Not tested   Puree Puree: Within functional limits Presentation: Spoon;Self Fed   Solid     Solid: Not tested     Gearlean Alf Student SLP  12/29/2021,1:26 PM

## 2021-12-29 NOTE — Progress Notes (Signed)
[facility] ADULT ICU REPLACEMENT PROTOCOL   The patient does apply for the [facility] Adult ICU Electrolyte Replacment Protocol based on the criteria listed below:   1.Exclusion criteria: TCTS patients, ECMO patients, and Dialysis patients 2. Is GFR >/= 30 ml/min? Yes.    Patient's GFR today is 42 3. Is SCr </= 2? Yes.   Patient's SCr is 1.43 mg/dL 4. Did SCr increase >/= 0.5 in 24 hours? No. 5.Pt's weight >40kg  Yes.   6. Abnormal electrolyte(s): k 3.3, mag 1.6  7. Electrolytes replaced per protocol 8.  Call MD STAT for K+ </= 2.5, Phos </= 1, or Mag </= 1 Physician:    Markus Daft A 12/29/2021 5:55 AM

## 2021-12-30 ENCOUNTER — Inpatient Hospital Stay (HOSPITAL_COMMUNITY): Payer: Medicare Other

## 2021-12-30 DIAGNOSIS — D508 Other iron deficiency anemias: Secondary | ICD-10-CM | POA: Diagnosis not present

## 2021-12-30 DIAGNOSIS — D649 Anemia, unspecified: Secondary | ICD-10-CM | POA: Diagnosis not present

## 2021-12-30 DIAGNOSIS — K7030 Alcoholic cirrhosis of liver without ascites: Secondary | ICD-10-CM | POA: Diagnosis not present

## 2021-12-30 DIAGNOSIS — R627 Adult failure to thrive: Secondary | ICD-10-CM

## 2021-12-30 DIAGNOSIS — Z515 Encounter for palliative care: Secondary | ICD-10-CM

## 2021-12-30 DIAGNOSIS — K7011 Alcoholic hepatitis with ascites: Secondary | ICD-10-CM

## 2021-12-30 DIAGNOSIS — Z7189 Other specified counseling: Secondary | ICD-10-CM

## 2021-12-30 DIAGNOSIS — N179 Acute kidney failure, unspecified: Secondary | ICD-10-CM | POA: Diagnosis not present

## 2021-12-30 DIAGNOSIS — R6521 Severe sepsis with septic shock: Secondary | ICD-10-CM | POA: Diagnosis not present

## 2021-12-30 DIAGNOSIS — A419 Sepsis, unspecified organism: Secondary | ICD-10-CM | POA: Diagnosis not present

## 2021-12-30 LAB — COMPREHENSIVE METABOLIC PANEL
ALT: 25 U/L (ref 0–44)
AST: 40 U/L (ref 15–41)
Albumin: 1.7 g/dL — ABNORMAL LOW (ref 3.5–5.0)
Alkaline Phosphatase: 91 U/L (ref 38–126)
Anion gap: 13 (ref 5–15)
BUN: 37 mg/dL — ABNORMAL HIGH (ref 6–20)
CO2: 20 mmol/L — ABNORMAL LOW (ref 22–32)
Calcium: 8.3 mg/dL — ABNORMAL LOW (ref 8.9–10.3)
Chloride: 95 mmol/L — ABNORMAL LOW (ref 98–111)
Creatinine, Ser: 1.34 mg/dL — ABNORMAL HIGH (ref 0.44–1.00)
GFR, Estimated: 46 mL/min — ABNORMAL LOW (ref >60)
Glucose, Bld: 144 mg/dL — ABNORMAL HIGH (ref 70–99)
Potassium: 3.7 mmol/L (ref 3.5–5.1)
Sodium: 128 mmol/L — ABNORMAL LOW (ref 135–145)
Total Bilirubin: 14.8 mg/dL — ABNORMAL HIGH (ref 0.3–1.2)
Total Protein: 4.8 g/dL — ABNORMAL LOW (ref 6.5–8.1)

## 2021-12-30 LAB — CBC WITH DIFFERENTIAL/PLATELET
Abs Immature Granulocytes: 0.18 10*3/uL — ABNORMAL HIGH (ref 0.00–0.07)
Basophils Absolute: 0 10*3/uL (ref 0.0–0.1)
Basophils Relative: 0 %
Eosinophils Absolute: 0.1 10*3/uL (ref 0.0–0.5)
Eosinophils Relative: 1 %
HCT: 24.5 % — ABNORMAL LOW (ref 36.0–46.0)
Hemoglobin: 8.6 g/dL — ABNORMAL LOW (ref 12.0–15.0)
Immature Granulocytes: 2 %
Lymphocytes Relative: 6 %
Lymphs Abs: 0.5 10*3/uL — ABNORMAL LOW (ref 0.7–4.0)
MCH: 33.6 pg (ref 26.0–34.0)
MCHC: 35.1 g/dL (ref 30.0–36.0)
MCV: 95.7 fL (ref 80.0–100.0)
Monocytes Absolute: 1 10*3/uL (ref 0.1–1.0)
Monocytes Relative: 12 %
Neutro Abs: 6.7 10*3/uL (ref 1.7–7.7)
Neutrophils Relative %: 79 %
Platelets: 22 10*3/uL — CL (ref 150–400)
RBC: 2.56 MIL/uL — ABNORMAL LOW (ref 3.87–5.11)
WBC: 8.4 10*3/uL (ref 4.0–10.5)
nRBC: 0.7 % — ABNORMAL HIGH (ref 0.0–0.2)

## 2021-12-30 LAB — GLUCOSE, CAPILLARY
Glucose-Capillary: 102 mg/dL — ABNORMAL HIGH (ref 70–99)
Glucose-Capillary: 102 mg/dL — ABNORMAL HIGH (ref 70–99)
Glucose-Capillary: 113 mg/dL — ABNORMAL HIGH (ref 70–99)
Glucose-Capillary: 80 mg/dL (ref 70–99)
Glucose-Capillary: 88 mg/dL (ref 70–99)
Glucose-Capillary: 95 mg/dL (ref 70–99)

## 2021-12-30 LAB — BASIC METABOLIC PANEL
Anion gap: 11 (ref 5–15)
BUN: 41 mg/dL — ABNORMAL HIGH (ref 6–20)
CO2: 20 mmol/L — ABNORMAL LOW (ref 22–32)
Calcium: 8.6 mg/dL — ABNORMAL LOW (ref 8.9–10.3)
Chloride: 100 mmol/L (ref 98–111)
Creatinine, Ser: 1.42 mg/dL — ABNORMAL HIGH (ref 0.44–1.00)
GFR, Estimated: 43 mL/min — ABNORMAL LOW (ref >60)
Glucose, Bld: 86 mg/dL (ref 70–99)
Potassium: 4.8 mmol/L (ref 3.5–5.1)
Sodium: 131 mmol/L — ABNORMAL LOW (ref 135–145)

## 2021-12-30 LAB — TYPE AND SCREEN
ABO/RH(D): O POS
Antibody Screen: NEGATIVE
Unit division: 0
Unit division: 0
Unit division: 0

## 2021-12-30 LAB — BLOOD CULTURE ID PANEL (REFLEXED) - BCID2

## 2021-12-30 LAB — TECHNOLOGIST SMEAR REVIEW

## 2021-12-30 LAB — PROTIME-INR
INR: 1.3 — ABNORMAL HIGH (ref 0.8–1.2)
Prothrombin Time: 16.1 seconds — ABNORMAL HIGH (ref 11.4–15.2)

## 2021-12-30 LAB — BPAM RBC
Blood Product Expiration Date: 202310062359
Blood Product Expiration Date: 202310062359
Blood Product Expiration Date: 202310072359
ISSUE DATE / TIME: 202309091912
ISSUE DATE / TIME: 202309092219
ISSUE DATE / TIME: 202309111230
Unit Type and Rh: 5100
Unit Type and Rh: 5100
Unit Type and Rh: 5100

## 2021-12-30 LAB — CBC
HCT: 24.4 % — ABNORMAL LOW (ref 36.0–46.0)
HCT: 23.8 % — ABNORMAL LOW (ref 36.0–46.0)
Hemoglobin: 8.5 g/dL — ABNORMAL LOW (ref 12.0–15.0)
Hemoglobin: 8.4 g/dL — ABNORMAL LOW (ref 12.0–15.0)
MCH: 33.3 pg (ref 26.0–34.0)
MCH: 33.2 pg (ref 26.0–34.0)
MCHC: 34.8 g/dL (ref 30.0–36.0)
MCHC: 35.3 g/dL (ref 30.0–36.0)
MCV: 95.7 fL (ref 80.0–100.0)
MCV: 94.1 fL (ref 80.0–100.0)
Platelets: 23 10*3/uL — CL (ref 150–400)
Platelets: 18 10*3/uL — CL (ref 150–400)
RBC: 2.55 MIL/uL — ABNORMAL LOW (ref 3.87–5.11)
RBC: 2.53 MIL/uL — ABNORMAL LOW (ref 3.87–5.11)
WBC: 8.4 10*3/uL (ref 4.0–10.5)
WBC: 8 10*3/uL (ref 4.0–10.5)
nRBC: 0.6 % — ABNORMAL HIGH (ref 0.0–0.2)
nRBC: 0.8 % — ABNORMAL HIGH (ref 0.0–0.2)

## 2021-12-30 LAB — DIC (DISSEMINATED INTRAVASCULAR COAGULATION)PANEL
D-Dimer, Quant: 2.7 ug/mL-FEU — ABNORMAL HIGH (ref 0.00–0.50)
Fibrinogen: 316 mg/dL (ref 210–475)
INR: 1.4 — ABNORMAL HIGH (ref 0.8–1.2)
Platelets: 20 10*3/uL — CL (ref 150–400)
Prothrombin Time: 16.7 seconds — ABNORMAL HIGH (ref 11.4–15.2)
Smear Review: NONE SEEN
aPTT: 55 seconds — ABNORMAL HIGH (ref 24–36)

## 2021-12-30 LAB — LACTATE DEHYDROGENASE: LDH: 203 U/L — ABNORMAL HIGH (ref 98–192)

## 2021-12-30 LAB — MAGNESIUM: Magnesium: 2.3 mg/dL (ref 1.7–2.4)

## 2021-12-30 MED ORDER — POTASSIUM CHLORIDE 10 MEQ/100ML IV SOLN
10.0000 meq | INTRAVENOUS | Status: AC
  Administered 2021-12-30 (×4): 10 meq via INTRAVENOUS
  Filled 2021-12-30 (×4): qty 100

## 2021-12-30 MED ORDER — PANTOPRAZOLE SODIUM 40 MG PO TBEC
40.0000 mg | DELAYED_RELEASE_TABLET | Freq: Two times a day (BID) | ORAL | Status: DC
  Administered 2021-12-30: 40 mg via ORAL
  Filled 2021-12-30: qty 1

## 2021-12-30 MED ORDER — LACTATED RINGERS IV BOLUS
1000.0000 mL | Freq: Once | INTRAVENOUS | Status: AC
  Administered 2021-12-30: 1000 mL via INTRAVENOUS

## 2021-12-30 MED ORDER — MIDODRINE HCL 5 MG PO TABS
15.0000 mg | ORAL_TABLET | Freq: Three times a day (TID) | ORAL | Status: DC
  Administered 2021-12-30 – 2022-01-01 (×5): 15 mg via ORAL
  Filled 2021-12-30 (×6): qty 3

## 2021-12-30 NOTE — Progress Notes (Signed)
Pharmacy Antibiotic Note  Laura [name] is a 61 y.o. adult for which pharmacy has been consulted for cefepime dosing for sepsis.  Patient with a history of chronic back pain, HTN, HLD, hypokalemia, anemia, transaminitis. Patient presenting after being found unresponsive.   Cipro eye drops ordered by CCM for conjunctivitis.  Renal function is stable. Patient is afebrile and WBC: 8.4. Calculated creatinine clearance is 59mL/min.   Plan: Continue Cefepime 2g IV every 12 hours Planning for total course of 5-7 days pending vasopressor requirement.  F/u cultures, clinical course, WBC, fever De-escalate when able  Weight: 63.6 kg (140 lb 3.4 oz)  Temp (24hrs), Avg:97.8 F (36.6 C), Min:97.3 F (36.3 C), Max:98.3 F (36.8 C)  Recent Labs  Lab 12/27/21 1649 12/27/21 1650 12/27/21 1650 12/27/21 1751 12/27/21 1839 12/28/21 0321 12/28/21 1039 12/28/21 1634 12/28/21 1850 12/28/21 2307 12/29/21 0310 12/29/21 1021 12/29/21 1632 12/30/21 0310  WBC  --  5.5   < >  --   --  2.5* 4.9  --  7.4 7.4  --  6.8 8.3 8.4  CREATININE  --  2.14*  --  1.90*  --  1.80*  --   --   --   --  1.43*  --   --  1.34*  LATICACIDVEN 4.0*  --   --   --  4.9*  --  1.4 2.0*  --  1.2  --   --   --   --    < > = values in this interval not displayed.     Estimated Creatinine Clearance (by C-G formula based on SCr of 1.34 mg/dL (H)) Female: 44 mL/min (A) Female: 53.4 mL/min (A)    Allergies  Allergen Reactions   Garlic Anaphylaxis   Aspirin Hives   Lyrica [Pregabalin]     Nightmares, did not help neuropathy   Penicillins Hives    Antimicrobials this admission: cefepime 9/9 >>  vancomycin 9/9 >>9/11  Microbiology results: 9/9 BCx >> NGTD x 3d  9/10 MRSA PCR negative 9/9 UCx >> not collected   Thank you for allowing pharmacy to be a part of this patient's care.  Estill Batten, PharmD  Clinical Pharmacist Please refer to [facility] for [facility] numbers 12/30/2021 9:54 AM

## 2021-12-30 NOTE — Consult Note (Signed)
Palliative Care Consult Note                                  Date: 12/30/2021   Patient Name: Laura [name]  DOB: 09-Oct-1960  MRN: [id number]  Age / Sex: 61 y.o., adult  PCP: Lauree Chandler, NP Referring Physician: Margaretha Seeds, MD  Reason for Consultation: Establishing goals of care  HPI/Patient Profile: Laura [name] is a 61 y.o. adult who identifies as female with PMH of chronic back pain, alcohol abuse, anemia, HTN, and HLD who presented to [facility] ED on 12/27/2021 with reports of weakness and fall. Initial labs revealed severe anemia, elevated bilirubin, and elevated ammonia. Admitted to PCCM with septic shock with unclear source. Also with symptomatic anemia, coagulopathy, SVT/hypotension, AKI, mild rhabdomyolysis, and failure to thrive.   Past Medical History:  Diagnosis Date   Acute bronchitis    Cervical high risk HPV (human papillomavirus) test positive 12/2017   Negative subtype 16, 18/45   Chronic back pain    Complication of anesthesia    "didn't wake up"   Cough    Insomnia, unspecified    Other malaise and fatigue    Other specified diseases of blood and blood-forming organs(289.89)    Tobacco use disorder     Subjective:   I have reviewed medical records including progress notes, labs and imaging, and received an update from patient's RN. He remains on vasopressor support (low-dose).  I met at bedside today with Laura [name] and his partner Laura [name] to discuss diagnosis, prognosis, Laura [name], Laura [name] wishes, disposition, and options. Laura [name] is quite lethargic with his eyes closed throughout my visit, not engaging in conversation, and answering questions only with much prompting. Laura [name] is also not very engaged; I noted she is playing a game on her phone during most of our discussion.   I introduced Palliative Medicine as specialized medical care for people living with serious illness. It focuses on providing relief from the  symptoms and stress of a serious illness.   A brief life review was discussed.  Laura [name] has lived his entire life in the [place] area.  He tells me that he and Laura [name] are legally married.  They have no children.  He has been on disability for some time due to chronic back pain.  He confirms having additional support through his brother Laura [name], niece Laura [name], and friend Laura [name].Laura [name] confirmed that he would want Laura [name] to make medical decisions for him if he unable to make those decisions for himself.   Laura [name] and Laura [name] lived together in an apartment in [place].  Prior to admission, they report that he was able to walk and was independent with ADLs.  Laura [name] states "I was fine". This is not consistent with patient's malnutrition (albumin 1.7) and skin wounds that were present on admission.   We discussed Laura [name]'s current illness and what it means in the larger context of his ongoing co-morbidities. Reviewed current medical issues including anemia, coagulopathy, thrombocytopenia, and acute kidney injury (resolving).  Also discussed that Laura [name] has liver cirrhosis, and emphasized that this is a progressive and noncurable disease process.  Created space and opportunity for patient and family to explore thoughts and feelings regarding current medical situation.  Laura [name] expresses her strong conviction that Laura [name] will "get better" and be able to return home.  She speaks to her strong faith in God.   We discussed current plan of care, including IV fluids,  antibiotics, blood transfusion if needed, and vasopressor support. Laura [name] is not willing/able to set any limitations on plan of care; he is interested in all full scope medical interventions offered.   We did discuss code status. Provided education that "full code" resuscitation attempts have poor outcomes in similar hospitalized patients, as the cause of the arrest is likely associated with chronic/terminal disease rather than a reversible acute  cardio-pulmonary event. Laura [name] and Laura [name] confirm desire to continue full code status, understanding this includes CPR, defibrillation, and intubation.    Discussed the importance of continued conversation with patient, family, and the medical team regarding overall plan of care and treatment options.     Review of Systems  Musculoskeletal:  Positive for back pain.    Objective:   Primary Diagnoses: Present on Admission:  Anemia   Physical Exam Vitals reviewed.  Constitutional:      General: He is not in acute distress.    Comments: Chronically ill-appearing  Cardiovascular:     Rate and Rhythm: Normal rate.  Pulmonary:     Effort: Pulmonary effort is normal. Tachypnea present.  Neurological:     Mental Status: He is oriented to person, place, and time. He is lethargic.     Motor: Weakness present.  Psychiatric:        Mood and Affect: Affect is flat.        Behavior: Behavior is withdrawn.     Vital Signs:  BP 95/72   Pulse 70   Temp 98.3 F (36.8 C) (Oral)   Resp 18   Wt 63.6 kg   SpO2 97%   BMI 21.96 kg/m   Palliative Assessment/Data: PPS 20-30%     Assessment & Plan:   SUMMARY OF RECOMMENDATIONS   Full code  Continue full scope medical interventions Patient and wife seem to have poor insight into the seriousness of his current medical situation and will need ongoing education Ongoing palliative support   Primary Decision Maker: PATIENT  Symptom Management:  Per PCCM  Prognosis:  Overall poor in the setting of decompensated liver cirrhosis and failure to thrive  Discharge Planning:  To Be Determined   Discussed with: Dr. Loanne Drilling   Thank you for allowing [place] to participate in the care of Laura [name]  MDM - High   Signed by: Elie Confer, NP Palliative Medicine Team  Team Phone # [phone]  For individual providers, please see AMION

## 2021-12-30 NOTE — Progress Notes (Addendum)
Attending physician's note   I have taken a history, reviewed the chart, and examined the patient. I performed a substantive portion of this encounter, including complete performance of at least one of the key components, in conjunction with the APP. I agree with the APP's note, impression, and recommendations with my edits.   H/H stable after RBC transfusion yesterday. No overt bleeding. Tbili elevated at 14.8, but largely stable from yesterday.   - No plan for repeat EGD/Colo at this time - Ok to slowly advance diet as tolerated - Reasonable to pursue hemolysis w/u, but higher suspicion for hyperbili to be 2/2 delayed bili lag (sepsis, Abx) w/ concomitant Alc Hep - Abx and sepsis management per primary CCS - Ok to change to PO PPI - Inpatient GI service will sign off at this time. Please do not hesitate to contact with additional questions or concerns    Doristine Locks, DO, FACG [phone] office                Daily Rounding Note  12/30/2021, 2:54 PM  LOS: 3 days   SUBJECTIVE:   Chief complaint:    Acute on chronic anemia.  Stool yesterday was not melenic or bloody.   No abdominal pain or N/V Off midodrine.  Levophed in place, decelerating doses.  BPs remain hypotensive in 80s-90s/50s.  No fever or tachycardia.     OBJECTIVE:         Vital signs in last 24 hours:    Temp:  [97.5 F (36.4 C)-98.3 F (36.8 C)] 97.5 F (36.4 C) (09/12 1141) Pulse Rate:  [63-87] 70 (09/12 1430) Resp:  [12-27] 21 (09/12 1430) BP: (73-119)/(50-92) 92/55 (09/12 1430) SpO2:  [88 %-100 %] 96 % (09/12 1430) Last BM Date : 12/29/21 Filed Weights   12/27/21 1920 12/27/21 2126  Weight: 68 kg 63.6 kg   General: chronically ill and acutely ill   Eyes: R eye w dry discharge and lid closed.   Heart: RRR Chest: no labored breathing or cough.   Abdomen: soft, NT, ND, BS normal but hypoactive.   Extremities: + LE edema Neuro/Psych:  Disengaged.  Lethargic though arousable.  Speech is garbled.  Not entirely following commands.  Tremor in her hands with possible asterixis.  Intake/Output from previous day: 09/11 0701 - 09/12 0700 In: 1424.7 [I.V.:374.3; Blood:477.5; IV Piggyback:572.9] Out: 335 [Urine:335]  Intake/Output this shift: Total I/O In: -  Out: 117 [Urine:117]  Lab Results: Recent Labs    12/29/21 1021 12/29/21 1632 12/30/21 0310 12/30/21 1032  WBC 6.8 8.3 8.4  8.4  --   HGB 6.8* 8.0* 8.6*  8.5*  --   HCT 19.8* 23.1* 24.5*  24.4*  --   PLT 28* 25* 22*  23* 20*   BMET Recent Labs    12/28/21 0321 12/29/21 0310 12/30/21 0310  NA 134* 134* 128*  K 3.8 3.4* 3.7  CL 97* 99 95*  CO2 22 23 20*  GLUCOSE 98 114* 144*  BUN 30* 34* 37*  CREATININE 1.80* 1.43* 1.34*  CALCIUM 8.2* 8.5* 8.3*   LFT Recent Labs    12/28/21 0321 12/28/21 1039 12/29/21 0310 12/30/21 0310  PROT 4.8*  --  4.8* 4.8*  ALBUMIN 1.9*  --  1.8* 1.7*  AST 69*  --  44* 40  ALT 33  --  25 25  ALKPHOS 91  --  79 91  BILITOT 13.6* 14.6* 14.3* 14.8*  BILIDIR  --  8.2* 8.0*  --  IBILI  --  6.4* 6.3*  --    PT/INR Recent Labs    12/30/21 0310 12/30/21 1032  LABPROT 16.1* 16.7*  INR 1.3* 1.4*   Hepatitis Panel No results for input(s): "HEPBSAG", "HCVAB", "HEPAIGM", "HEPBIGM" in the last 72 hours.  Studies/Results: DG CHEST PORT 1 VIEW  Result Date: 12/30/2021 CLINICAL DATA:  Shortness of breath. EXAM: PORTABLE CHEST 1 VIEW COMPARISON:  12/27/2021 FINDINGS: The cardiac silhouette, mediastinal and hilar contours are within limits and stable. New left-sided PICC line is noted with its tip in the right atrium. This could be retracted approximately 3 cm. There are new bibasilar airspace opacities suspicious for pneumonia. Aspiration would be a consideration. IMPRESSION: 1. New left-sided PICC line with its tip in the right atrium. This could be retracted approximately 3 cm. 2. New bibasilar infiltrates. Electronically  Signed   By: Rudie Meyer M.D.   On: 12/30/2021 08:38   [place] EKG SITE RITE  Result Date: 12/28/2021 If Site Rite image not attached, placement could not be confirmed due to current cardiac rhythm.   Scheduled Meds:  Chlorhexidine Gluconate Cloth  6 each Topical Q0600   ciprofloxacin  1 drop Both Eyes Q4H while awake   folic acid  1 mg Intravenous Daily   Gerhardt's butt cream   Topical BID   leptospermum manuka honey  1 Application Topical Daily   midodrine  15 mg Oral Q8H   pantoprazole (PROTONIX) IV  40 mg Intravenous Q12H   sodium chloride flush  10-40 mL Intracatheter Q12H   thiamine (VITAMIN B1) injection  100 mg Intravenous Daily   Continuous Infusions:  sodium chloride     ceFEPime (MAXIPIME) IV 2 g (12/30/21 1036)   norepinephrine (LEVOPHED) Adult infusion 2 mcg/min (12/30/21 0700)   potassium chloride 10 mEq (12/30/21 1355)   PRN Meds:.docusate sodium, Gerhardt's butt cream, morphine injection, mouth rinse, polyethylene glycol, sodium chloride flush   ASSESMENT:   Acute on chronic anemia with symptoms of progressive weakness.  Received blood transfusion 06/2021.  Previous B12 deficiency.  Folate deficient w iron, ferritin, B12 okay 07/2021.  Iron and folate on home med list, not clear she was taking these.  Hgb 5.4.Marland KitchenMarland Kitchen 2 PRBCs... 8.2.. 8.6.       FOBT positive though no overt melena/CGE/BPR.  06/2021 EGD.  Grossly normal.  Duodenal biopsies w partial blunting of villi, mild inflammation, no increased intraepithelial lymphocytes and biopsy deemed nondiagnostic for celiac disease.  06/2021 colonoscopy normal.  No pathology on random biopsies.  Mild rhabdo.  Elevated CK (no recheck), AKI (improved), Lactic acidosis (now normal).  EtOH use disorder.  Pt reported last use early 2023 but labs suspicious for ongoing consumption.  EtOH hepatitis.  Hepatomegaly, steatosis on 07/2021 MRI but no definite cirrhosis.  MELD-Na 27.  EF less than 32.  Current liver Dopplers and limited  abdominal ultrasound with patent and antegrade portal/hepatic vein flow, hepatomegaly, hepatic steatosis.  Uncomplicated cholelithiasis.  AST improved.  T. bili not budging, 14.8 today overall worse over several days.  Coagulopathy.  INR improved.  1.5..  1.3.  Pancytopenia with profound thrombocytopenia.  No splenomegaly on imaging.  Protein calorie malnutrition, mild.    Hyponatremia.  Na worse, 128 today.      Septic shock.  Maxipime in place.  Hypotension persists though midodrine discontinued and decelerating doses of Levophed.  Conjunctivitis  PLAN    Switch to po PPI.    Advance to soft diet    Jennye Moccasin  12/30/2021, 2:54 PM  Phone [phone]

## 2021-12-30 NOTE — Progress Notes (Signed)
NAME:  Laura [name], MRN:  [id number], DOB:  08-07-1960, LOS: 3 ADMISSION DATE:  12/27/2021, CONSULTATION DATE:  12/27/21 REFERRING MD:  Dr. Myrlene Broker / EDP, CHIEF COMPLAINT:  Fall    History of Present Illness:  61 y/o patient who presented to [facility] ER on 9/9 via EMS with reports of weakness and fall.    The patient was admitted in April 2023 with symptomatic anemia, hypotension, SVT, electrolyte disturbances, UTI & conjunctivitis.  He was seen by Hematology during that admission and his anemia was felt to be related to anemia of chronic disease in the setting of liver disease. He was also seen by GI and was planned to follow up with GI as outpatient but it does not appear he was able to follow up.    Laura Needle identifies as female, lives in an apartment.  His cousin assists in his medical care at home.  He reportedly has had progressive weakness.  He attempted to go to the bathroom and fell.  He apparently was on the floor for at least a day.  EMS was activated and found the patient to be hypotensive with SBP in 60's and in SVT.  He was apparently difficult to stick for an IV. The patient reports he continues to smoke 1/2ppd, quit drinking approximately 7 months ago. Per his cousin, he has had episodes of incontinence with moisture associated rash on peri area.  In ER, the patient required O2 to maintain saturations.    Initial ER evaluation notable for recurrent episodes of SVT, hypotension.  Labs -Na 136, K 3.1, Cl 96, glucose 75, BUN 29, Cr 2.14, calcium 8.5, AG 20, albumin 2.3, AST 99 / ALT 36, ammonia 60, TSH 2.8, lactic acid 4, WBC 5.5, Hgb 5, Hct 15.5, MCV 140, platelets 77 & INR 1.5.  CXR negative. COVID negative. Blood & urine cultures pending.   PCCM called for ICU admission.    Pertinent  Medical History  Chronic Back Pain  Tobacco Abuse  ETOH Abuse - reportedly quit Feb 2023 Insomnia  Laminectomy   Significant Hospital Events: Including procedures, antibiotic start and stop dates in  addition to other pertinent events   9/9 Admit s/p mechanical fall  9/11 Hgb 6.8 > 1U pRBC  Interim History / Subjective:  This morning remains on levophed 2U  Objective   Blood pressure 93/66, pulse 87, temperature 97.9 F (36.6 C), temperature source Oral, resp. rate (!) 27, weight 63.6 kg, SpO2 98 %.        Intake/Output Summary (Last 24 hours) at 12/30/2021 0658 Last data filed at 12/30/2021 0600 Gross per 24 hour  Intake 1439.66 ml  Output 335 ml  Net 1104.66 ml    Filed Weights   12/27/21 1920 12/27/21 2126  Weight: 68 kg 63.6 kg   Physical Exam: General: NAD, responds to voice HENT: NCAT, MMM Eyes: Equal pupils Respiratory: CTAB, some decreased breath sounds in lower anterior fields no w/r/c Cardiovascular: RRR no m/r/g GI: Nontender, soft Extremities: No LE edema, well perfused Neuro: alert and responsive to questions Psych: Mood appropriate  CXR new bibasilar infiltrates  Na 128 from 134 K 3.7 from 3.4 BUN 37 from 34 Cr 1.34 from 1.43 Hgb 8.5 from 8.0 PT 16.1 from 18.1 INR 1.3 from 1.5 CBG 108 to 163 Plts 23 from 25 AST 40 from 44 ALT 25 from 25 Bcx NGTD  Resolved Hospital Problem list     Assessment & Plan:   Acute hypoxemic respiratory failure  CXR today with  new bibasilar infiltrates. On 6 L currently.  -Wean supplemental O2 for goal >88% -continue cefepime  Septic shock. Cannot rule out sepsis or cardiogenic shock. Low suspicion for active bleed. Sepsis could be from skin wounds or urinary incontinence or new pulmonary source -Continue Cefepime -Wean levophed for MAP goal >60 -Trend LA -Wound care  SVT Trop peak 35. HR has been 70/80s now -Telemetry -goal K+>4, Mg+ >2  -Vasovagal maneuvers PRN  Symptomatic Anemia  Anemia of Chronic Disease  Coagulopathy  Similar admission in April 2023, felt to be related to chronic disease / liver.  INR 1.5 on admission.  S/p PRBC x 2  -Trend CBC for Hg goal >7 -PPI BID -thiamine, folate  IV -GI consulted. Appreciate input. No indication for endoscopic evaluation  Thrombocytopenia -Trend -Transfuse for goal >10  AKI  Mild Rhabdomyolysis - improving In setting of volume depletion, downtime of ~ 1 day -Trend BMP / urinary output -Replace electrolytes as indicated -Avoid nephrotoxic agents, ensure adequate renal perfusion  Hypokalemia, Hypomagnesemia  -Trend  -Replete as needed  Hx ETOH abuse Hepatomegaly/hepatic steatosis Elevated Bilirubin  Suspect this is end stage liver disease  -Trend LFTs, INR -Thiamine, folic acid  Conjunctivitis  -cipro eye drops   Adult Failure to Thrive  -Palliative Care Consult  -Pt request full code on admission.  Feel he has poor insight into his medical care.  He states if he could not make decisions for himself, he would want Laura [name] [phone] to make decisions for him and his goddaughter   Skin wounds on buttocks, perineum, sacrum, POA -Wound care consult  Best Practice (right click and "Reselect all SmartList Selections" daily)  Diet/type: Regular consistency (see orders) DVT prophylaxis: SCD GI prophylaxis: PPI Lines: N/A Foley:  N/A Code Status:  full code Last date of multidisciplinary goals of care discussion: 9/9, confirmed full code   Critical care time: 60 minutes    Laura Erp, MD FM PGY-2 [place] [facility] 12/30/2021 6:58 AM   Please see Amion for pager number to reach on-call Pulmonary and Critical Care Team.

## 2021-12-30 NOTE — Progress Notes (Signed)
PHARMACY - PHYSICIAN COMMUNICATION CRITICAL VALUE ALERT - BLOOD CULTURE IDENTIFICATION (BCID)  Laura [name] is an 61 y.o. adult who presented to [facility] on 12/27/2021 with a chief complaint of sepsis  Assessment: staphylococcus species, 1 out of 3 bottles (aerobic only), probable contaminant  Name of physician (or Provider) Contacted: Discussed with Estill Batten, PharmD who will communicate with MD   Current antibiotics: cefepime 2g IV q12h  Changes to prescribed antibiotics recommended:  Patient is on recommended antibiotics - No changes needed  Results for orders placed or performed during the hospital encounter of 12/27/21  Blood Culture ID Panel (Reflexed) (Collected: 12/27/2021 10:24 PM)  Result Value Ref Range   Enterococcus faecalis NOT DETECTED NOT DETECTED   Enterococcus Faecium NOT DETECTED NOT DETECTED   Listeria monocytogenes NOT DETECTED NOT DETECTED   Staphylococcus species DETECTED (A) NOT DETECTED   Staphylococcus aureus (BCID) NOT DETECTED NOT DETECTED   Staphylococcus epidermidis NOT DETECTED NOT DETECTED   Staphylococcus lugdunensis NOT DETECTED NOT DETECTED   Streptococcus species NOT DETECTED NOT DETECTED   Streptococcus agalactiae NOT DETECTED NOT DETECTED   Streptococcus pneumoniae NOT DETECTED NOT DETECTED   Streptococcus pyogenes NOT DETECTED NOT DETECTED   A.calcoaceticus-baumannii NOT DETECTED NOT DETECTED   Bacteroides fragilis NOT DETECTED NOT DETECTED   Enterobacterales NOT DETECTED NOT DETECTED   Enterobacter cloacae complex NOT DETECTED NOT DETECTED   Escherichia coli NOT DETECTED NOT DETECTED   Klebsiella aerogenes NOT DETECTED NOT DETECTED   Klebsiella oxytoca NOT DETECTED NOT DETECTED   Klebsiella pneumoniae NOT DETECTED NOT DETECTED   Proteus species NOT DETECTED NOT DETECTED   Salmonella species NOT DETECTED NOT DETECTED   Serratia marcescens NOT DETECTED NOT DETECTED   Haemophilus influenzae NOT DETECTED NOT DETECTED   Neisseria  meningitidis NOT DETECTED NOT DETECTED   Pseudomonas aeruginosa NOT DETECTED NOT DETECTED   Stenotrophomonas maltophilia NOT DETECTED NOT DETECTED   Candida albicans NOT DETECTED NOT DETECTED   Candida auris NOT DETECTED NOT DETECTED   Candida glabrata NOT DETECTED NOT DETECTED   Candida krusei NOT DETECTED NOT DETECTED   Candida parapsilosis NOT DETECTED NOT DETECTED   Candida tropicalis NOT DETECTED NOT DETECTED   Cryptococcus neoformans/gattii NOT DETECTED NOT DETECTED    Larena Sox, PharmD PGY1 Pharmacy Resident   12/30/2021  2:41 PM

## 2021-12-30 NOTE — Evaluation (Signed)
Physical Therapy Evaluation Patient Details Name: Laura [name] MRN: [id number] DOB: [birth year] Today's Date: 12/30/2021  History of Present Illness  Patient is a 61 y/o female who presents on 9/9 with weakness and a fall. Admitted with severe anemia, Acute hypoxemic respiratory failure, septic shock, SVTs, hypotension, acute kidney injury, SIRS, mild rhabdomyolosis. PMH includes chronic back pain, alcohol use and tobacco use disorder.  Clinical Impression  Patient presents with generalized weakness, pain, lethargy, decreased activity tolerance and impaired mobility s/p above. Pt lives at home with his significant other and requires some assist with dressing but able to bathe self at sink level PTA. Uses RW vs SPC for ambulation but does not drive or do any IADLs. Today, PT evaluation limited to bed level as pt refusing further mobility. Requires max A of 2 to roll in bed with cues for technique. Tolerated there ex of BLEs. Noted to have large rainbow shaped wound around sacrum. Sp02 dropped to 85% on RA with bed mobility with nasal cannula on his head. [name], pt's significant other, reports being able to support pt at home. At this time, recommend SNF to maximize independence and mobility prior to return home. May be able to progress to HHPT pending participation in therapy/increased activity/mobility. Will follow acutely.   Recommendations for follow up therapy are one component of a multi-disciplinary discharge planning process, led by the attending physician.  Recommendations may be updated based on patient status, additional functional criteria and insurance authorization.  Follow Up Recommendations Skilled nursing-short term rehab (<3 hours/day) Can patient physically be transported by private vehicle: No    Assistance Recommended at Discharge Frequent or constant Supervision/Assistance  Patient can return home with the following  Two people to help with walking and/or transfers;A lot of help  with bathing/dressing/bathroom;Assistance with cooking/housework;Assist for transportation    Equipment Recommendations None recommended by PT  Recommendations for Other Services       Functional Status Assessment Patient has had a recent decline in their functional status and/or demonstrates limited ability to make significant improvements in function in a reasonable and predictable amount of time     Precautions / Restrictions Precautions Precautions: Fall;Other (comment) Precaution Comments: watch 02 Restrictions Weight Bearing Restrictions: No      Mobility  Bed Mobility Overal bed mobility: Needs Assistance Bed Mobility: Rolling Rolling: Max assist, +2 for physical assistance         General bed mobility comments: ASsist of 2 to roll in both directions for placing pillows and checking wound on bottom    Transfers                   General transfer comment: Declined.    Ambulation/Gait               General Gait Details: Declined  Stairs            Wheelchair Mobility    Modified Rankin (Stroke Patients Only)       Balance                                             Pertinent Vitals/Pain Pain Assessment Pain Assessment: Faces Faces Pain Scale: Hurts whole lot Pain Location: bottom Pain Descriptors / Indicators: Discomfort, Grimacing, Guarding, Moaning Pain Intervention(s): Monitored during session, Repositioned    Home Living Family/patient expects to be discharged to:: Private residence Living Arrangements: Spouse/significant  other Available Help at Discharge: Friend(s);Available 24 hours/day Type of Home: Apartment Home Access: Level entry       Home Layout: One level Home Equipment: Cane - single point;Shower seat;Grab bars - Chartered loss adjuster (2 wheels)      Prior Function Prior Level of Function : Needs assist             Mobility Comments: Patient using a RW and cane for mobility,  stopped driving two months ago, but wants to attempt when he is feeling stronger ADLs Comments: Has assist from friend for ADLs, especially lower body dressing     Hand Dominance        Extremity/Trunk Assessment   Upper Extremity Assessment Upper Extremity Assessment: Defer to OT evaluation    Lower Extremity Assessment Lower Extremity Assessment: Generalized weakness (Limited AROM throughout ankles, knees, hips due to  pain)       Communication   Communication: No difficulties  Cognition Arousal/Alertness: Lethargic Behavior During Therapy: Flat affect Overall Cognitive Status: Difficult to assess                                 General Comments: Uninterested in answering questions/participating in therapy. Delayed responses, Eyes remained closed for first half of session.        General Comments General comments (skin integrity, edema, etc.): Sp02 dropped to 85% with rolling on RA, nasal cannula on top of head    Exercises General Exercises - Lower Extremity Ankle Circles/Pumps: AAROM, Both, 5 reps, Supine Heel Slides: AAROM, Both, 5 reps, Supine Hip ABduction/ADduction: AAROM, Both, 5 reps, Supine   Assessment/Plan    PT Assessment Patient needs continued PT services  PT Problem List Decreased range of motion;Decreased strength;Decreased mobility;Decreased skin integrity;Pain;Impaired sensation;Decreased balance;Decreased cognition;Cardiopulmonary status limiting activity;Decreased activity tolerance       PT Treatment Interventions Therapeutic activities;Therapeutic exercise;Patient/family education;Balance training;Gait training;Neuromuscular re-education;Wheelchair mobility training    PT Goals (Current goals can be found in the Care Plan section)  Acute Rehab PT Goals Patient Stated Goal: to get some sleep PT Goal Formulation: With patient Time For Goal Achievement: 01/13/22 Potential to Achieve Goals: Fair    Frequency Min 2X/week      Co-evaluation               AM-PAC PT "6 Clicks" Mobility  Outcome Measure Help needed turning from your back to your side while in a flat bed without using bedrails?: Total Help needed moving from lying on your back to sitting on the side of a flat bed without using bedrails?: Total Help needed moving to and from a bed to a chair (including a wheelchair)?: Total Help needed standing up from a chair using your arms (e.g., wheelchair or bedside chair)?: Total Help needed to walk in hospital room?: Total Help needed climbing 3-5 steps with a railing? : Total 6 Click Score: 6    End of Session Equipment Utilized During Treatment: Oxygen Activity Tolerance: Patient limited by fatigue;Patient limited by lethargy Patient left: in bed;with call bell/phone within reach;with bed alarm set;with nursing/sitter in room Nurse Communication: Mobility status PT Visit Diagnosis: Pain;Muscle weakness (generalized) (M62.81) Pain - part of body:  (bottom)    Time: 1124-1140 PT Time Calculation (min) (ACUTE ONLY): 16 min   Charges:   PT Evaluation $PT Eval Moderate Complexity: 1 Mod          Mechel Schutter H, PT, DPT Acute Rehabilitation Services Secure  chat preferred Office [place] 12/30/2021, 12:13 PM

## 2021-12-31 ENCOUNTER — Inpatient Hospital Stay (HOSPITAL_COMMUNITY): Payer: Medicare Other

## 2021-12-31 DIAGNOSIS — N179 Acute kidney failure, unspecified: Secondary | ICD-10-CM

## 2021-12-31 DIAGNOSIS — R4182 Altered mental status, unspecified: Secondary | ICD-10-CM

## 2021-12-31 DIAGNOSIS — K7011 Alcoholic hepatitis with ascites: Secondary | ICD-10-CM | POA: Diagnosis not present

## 2021-12-31 DIAGNOSIS — D508 Other iron deficiency anemias: Secondary | ICD-10-CM | POA: Diagnosis not present

## 2021-12-31 LAB — CBC
HCT: 22.8 % — ABNORMAL LOW (ref 36.0–46.0)
HCT: 24.6 % — ABNORMAL LOW (ref 36.0–46.0)
HCT: 22.4 % — ABNORMAL LOW (ref 36.0–46.0)
Hemoglobin: 7.9 g/dL — ABNORMAL LOW (ref 12.0–15.0)
Hemoglobin: 8.2 g/dL — ABNORMAL LOW (ref 12.0–15.0)
Hemoglobin: 7.6 g/dL — ABNORMAL LOW (ref 12.0–15.0)
MCH: 33.6 pg (ref 26.0–34.0)
MCH: 32.9 pg (ref 26.0–34.0)
MCH: 33 pg (ref 26.0–34.0)
MCHC: 34.6 g/dL (ref 30.0–36.0)
MCHC: 33.3 g/dL (ref 30.0–36.0)
MCHC: 33.9 g/dL (ref 30.0–36.0)
MCV: 97 fL (ref 80.0–100.0)
MCV: 98.8 fL (ref 80.0–100.0)
MCV: 97.4 fL (ref 80.0–100.0)
Platelets: 19 10*3/uL — CL (ref 150–400)
Platelets: 21 10*3/uL — CL (ref 150–400)
Platelets: 20 10*3/uL — CL (ref 150–400)
RBC: 2.35 MIL/uL — ABNORMAL LOW (ref 3.87–5.11)
RBC: 2.49 MIL/uL — ABNORMAL LOW (ref 3.87–5.11)
RBC: 2.3 MIL/uL — ABNORMAL LOW (ref 3.87–5.11)
WBC: 10.5 10*3/uL (ref 4.0–10.5)
WBC: 12.3 10*3/uL — ABNORMAL HIGH (ref 4.0–10.5)
WBC: 11.2 10*3/uL — ABNORMAL HIGH (ref 4.0–10.5)
nRBC: 0.5 % — ABNORMAL HIGH (ref 0.0–0.2)
nRBC: 0.5 % — ABNORMAL HIGH (ref 0.0–0.2)
nRBC: 0.3 % — ABNORMAL HIGH (ref 0.0–0.2)

## 2021-12-31 LAB — GLUCOSE, CAPILLARY
Glucose-Capillary: 75 mg/dL (ref 70–99)
Glucose-Capillary: 79 mg/dL (ref 70–99)
Glucose-Capillary: 79 mg/dL (ref 70–99)
Glucose-Capillary: 82 mg/dL (ref 70–99)
Glucose-Capillary: 85 mg/dL (ref 70–99)
Glucose-Capillary: 87 mg/dL (ref 70–99)

## 2021-12-31 LAB — COMPREHENSIVE METABOLIC PANEL
ALT: 23 U/L (ref 0–44)
AST: 43 U/L — ABNORMAL HIGH (ref 15–41)
Albumin: 1.5 g/dL — ABNORMAL LOW (ref 3.5–5.0)
Alkaline Phosphatase: 88 U/L (ref 38–126)
Anion gap: 10 (ref 5–15)
BUN: 43 mg/dL — ABNORMAL HIGH (ref 6–20)
CO2: 20 mmol/L — ABNORMAL LOW (ref 22–32)
Calcium: 8.4 mg/dL — ABNORMAL LOW (ref 8.9–10.3)
Chloride: 101 mmol/L (ref 98–111)
Creatinine, Ser: 1.53 mg/dL — ABNORMAL HIGH (ref 0.44–1.00)
GFR, Estimated: 39 mL/min — ABNORMAL LOW (ref >60)
Glucose, Bld: 87 mg/dL (ref 70–99)
Potassium: 4.6 mmol/L (ref 3.5–5.1)
Sodium: 131 mmol/L — ABNORMAL LOW (ref 135–145)
Total Bilirubin: 13.3 mg/dL — ABNORMAL HIGH (ref 0.3–1.2)
Total Protein: 4.4 g/dL — ABNORMAL LOW (ref 6.5–8.1)

## 2021-12-31 LAB — RETICULOCYTES
Immature Retic Fract: 23.5 % — ABNORMAL HIGH (ref 2.3–15.9)
RBC.: 2.47 MIL/uL — ABNORMAL LOW (ref 3.87–5.11)
Retic Count, Absolute: 80 10*3/uL (ref 19.0–186.0)
Retic Ct Pct: 3.2 % — ABNORMAL HIGH (ref 0.4–3.1)

## 2021-12-31 LAB — MAGNESIUM: Magnesium: 2.2 mg/dL (ref 1.7–2.4)

## 2021-12-31 LAB — BILIRUBIN, FRACTIONATED(TOT/DIR/INDIR)
Bilirubin, Direct: 7.7 mg/dL — ABNORMAL HIGH (ref 0.0–0.2)
Indirect Bilirubin: 6.4 mg/dL — ABNORMAL HIGH (ref 0.3–0.9)
Total Bilirubin: 14.1 mg/dL — ABNORMAL HIGH (ref 0.3–1.2)

## 2021-12-31 LAB — IRON AND TIBC: Iron: 39 ug/dL (ref 28–170)

## 2021-12-31 LAB — AMMONIA: Ammonia: 45 umol/L — ABNORMAL HIGH (ref 9–35)

## 2021-12-31 LAB — PROTIME-INR
INR: 1.6 — ABNORMAL HIGH (ref 0.8–1.2)
Prothrombin Time: 18.6 seconds — ABNORMAL HIGH (ref 11.4–15.2)

## 2021-12-31 LAB — VITAMIN B12: Vitamin B-12: 1505 pg/mL — ABNORMAL HIGH (ref 180–914)

## 2021-12-31 LAB — FOLATE: Folate: 17.5 ng/mL (ref >5.9)

## 2021-12-31 LAB — HAPTOGLOBIN: Haptoglobin: 69 mg/dL (ref 33–346)

## 2021-12-31 LAB — FERRITIN: Ferritin: 1727 ng/mL — ABNORMAL HIGH (ref 11–307)

## 2021-12-31 MED ORDER — ORAL CARE MOUTH RINSE
15.0000 mL | OROMUCOSAL | Status: DC
  Administered 2022-01-01 – 2022-01-02 (×6): 15 mL via OROMUCOSAL

## 2021-12-31 MED ORDER — LACTATED RINGERS IV BOLUS
500.0000 mL | Freq: Once | INTRAVENOUS | Status: AC
  Administered 2021-12-31: 500 mL via INTRAVENOUS

## 2021-12-31 MED ORDER — DIPHENHYDRAMINE HCL 50 MG/ML IJ SOLN: 25.0000 mg | Freq: Once | INTRAMUSCULAR | Status: DC | PRN

## 2021-12-31 MED ORDER — ORAL CARE MOUTH RINSE: 15.0000 mL | OROMUCOSAL | Status: DC | PRN

## 2021-12-31 MED ORDER — MORPHINE SULFATE (PF) 2 MG/ML IV SOLN
2.0000 mg | INTRAVENOUS | Status: DC | PRN
  Administered 2021-12-31 – 2022-01-02 (×3): 2 mg via INTRAVENOUS
  Filled 2021-12-31 (×4): qty 1

## 2021-12-31 MED ORDER — SODIUM CHLORIDE 0.9 % IV SOLN
2.0000 g | INTRAVENOUS | Status: DC
  Administered 2021-12-31 – 2022-01-02 (×3): 2 g via INTRAVENOUS
  Filled 2021-12-31 (×3): qty 20

## 2021-12-31 MED ORDER — PANTOPRAZOLE SODIUM 40 MG PO TBEC
40.0000 mg | DELAYED_RELEASE_TABLET | Freq: Every day | ORAL | Status: DC
  Filled 2021-12-31: qty 1

## 2021-12-31 MED ORDER — ORAL CARE MOUTH RINSE
15.0000 mL | OROMUCOSAL | Status: DC
  Administered 2021-12-31: 15 mL via OROMUCOSAL

## 2021-12-31 MED ORDER — PANTOPRAZOLE SODIUM 40 MG IV SOLR
40.0000 mg | INTRAVENOUS | Status: DC
  Administered 2021-12-31 – 2022-01-05 (×6): 40 mg via INTRAVENOUS
  Filled 2021-12-31 (×7): qty 10

## 2021-12-31 MED ORDER — SODIUM CHLORIDE 0.9 % IV BOLUS
1000.0000 mL | Freq: Once | INTRAVENOUS | Status: AC
  Administered 2021-12-31: 1000 mL via INTRAVENOUS

## 2021-12-31 MED ORDER — VITAMIN K1 10 MG/ML IJ SOLN
10.0000 mg | Freq: Once | INTRAVENOUS | Status: AC
  Administered 2021-12-31: 10 mg via INTRAVENOUS
  Filled 2021-12-31: qty 1

## 2021-12-31 MED ORDER — THIAMINE HCL 100 MG/ML IJ SOLN
500.0000 mg | Freq: Three times a day (TID) | INTRAVENOUS | Status: AC
  Administered 2021-12-31 – 2022-01-02 (×9): 500 mg via INTRAVENOUS
  Filled 2021-12-31 (×9): qty 5

## 2021-12-31 NOTE — TOC Progression Note (Signed)
Transition of Care [facility]) - Initial/Assessment Note    Patient Details  Name: Laura [name] MRN: [id number] Date of Birth: [birth year]  [facility] CM/SW Contact:    Ralene Bathe, LCSWA Phone Number: 12/31/2021, 12:05 PM  Clinical Narrative:                 CSW observed that PT recommendation is SNF.  TOC will continue to follow patient and complete SNF referral when patient is closer to being medically ready.      Barriers to Discharge: Continued Medical Work up   Patient Goals and CMS Choice Patient states their goals for this hospitalization and ongoing recovery are:: To return home CMS Medicare.gov Compare Post Acute Care list provided to:: Patient    Expected Discharge Plan and Services     Discharge Planning Services: CM Consult   Living arrangements for the past 2 months: Apartment                                      Prior Living Arrangements/Services Living arrangements for the past 2 months: Apartment Lives with:: Significant Other Patient language and need for interpreter reviewed:: Yes Do you feel safe going back to the place where you live?: Yes      Need for Family Participation in Patient Care: Yes (Comment) Care giver support system in place?: Yes (comment)   Criminal Activity/Legal Involvement Pertinent to Current Situation/Hospitalization: No - Comment as needed  Activities of Daily Living      Permission Sought/Granted Permission sought to share information with : Case Manager, Family Supports Permission granted to share information with : Yes, Verbal Permission Granted              Emotional Assessment Appearance:: Appears stated age Attitude/Demeanor/Rapport: Engaged, Gracious Affect (typically observed): Accepting, Appropriate, Calm, Hopeful, Pleasant Orientation: : Oriented to Self, Oriented to Place, Oriented to  Time, Oriented to Situation Alcohol / Substance Use: Not Applicable Psych Involvement: No  (comment)  Admission diagnosis:  Hyperbilirubinemia [E80.6] SVT (supraventricular tachycardia) (HCC) [I47.1] Anemia [D64.9] AKI (acute kidney injury) (HCC) [N17.9] Symptomatic anemia [D64.9] Patient Active Problem List   Diagnosis Date Noted   Alcoholic hepatitis with ascites    Alcoholic cirrhosis of liver without ascites (HCC)    Pressure injury of skin 12/28/2021   Anemia 12/27/2021   Conjunctivitis 08/01/2021   Vitamin D deficiency 08/01/2021   Erythema of abdominal wall 08/01/2021   Hyperbilirubinemia 07/30/2021   Elevated alkaline phosphatase level 07/30/2021   SVT (supraventricular tachycardia) (HCC) 07/30/2021   Hypocalcemia 07/30/2021   Edema 07/30/2021   UTI (urinary tract infection) 07/30/2021   Folate deficiency 07/30/2021   Exertional shortness of breath    Symptomatic anemia 07/29/2021   Hypotension 06/30/2021   Hypokalemia 06/30/2021   Normocytic anemia 06/30/2021   Hypomagnesemia 06/30/2021   Lactic acidosis 06/29/2021   Estrogen deficiency 09/30/2018   Macrocytic anemia 04/12/2018   Tobacco abuse 04/12/2018   Essential hypertension 04/12/2018   Body mass index (BMI) of 37.0-37.9 in adult 04/12/2018   Obesity (BMI 30.0-34.9) 09/29/2016   Low back pain 10/07/2015   Chronic night sweats 03/03/2015   Generalized anxiety disorder 06/07/2014   Allergic rhinitis 03/02/2013   Panic attack 09/05/2012   Insomnia 07/28/2012   Depression 07/28/2012   OA (osteoarthritis) of knee 07/28/2012   PCP:  Sharon Seller, NP Pharmacy:   [id number] [place],  [place] - [address] AT [facility] OF [address] [place] [address] Phone: [phone] Fax: [phone]  [facility] #[id number] [place], [place] - [address] AT [facility] OF [address] & [facility] [address] [place] [address] Phone: [phone] Fax: [phone]  [facility] ([facility]) - [place], [place] - [address] [place] [address] Phone: [phone] Fax: [phone]     Social Determinants of Health (SDOH) Interventions    Readmission Risk Interventions    07/02/2021   11:32 AM  Readmission Risk Prevention Plan  Post Dischage Appt Complete  Medication Screening Complete  Transportation Screening Complete

## 2021-12-31 NOTE — Consult Note (Addendum)
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 hypertension. Electronically Signed   By: Feliberto Harts M.D.   On: 12/31/2021 10:51   DG CHEST PORT 1 VIEW  Result Date: 12/30/2021 CLINICAL DATA:  Shortness of breath. EXAM: PORTABLE CHEST 1 VIEW COMPARISON:  12/27/2021 FINDINGS: The cardiac silhouette, mediastinal and hilar contours are within limits and stable. New left-sided PICC line is noted with its tip in the right atrium. This could be retracted approximately 3 cm. There are new bibasilar airspace opacities suspicious for pneumonia. Aspiration would be a consideration. IMPRESSION: 1. New left-sided PICC line with its tip in the right atrium. This could be retracted approximately 3 cm. 2. New bibasilar infiltrates. Electronically Signed   By: Rudie Meyer M.D.   On: 12/30/2021 08:38   [place] EKG SITE RITE  Result Date: 12/28/2021 If Site Rite image not attached, placement could not be confirmed due to current cardiac rhythm.  ECHOCARDIOGRAM COMPLETE  Result Date: 12/28/2021    ECHOCARDIOGRAM REPORT    Patient Name:   Laura [name] Date of Exam: 12/28/2021 Medical Rec #:  [id number]  Height:       67.0  in Accession #:    [id number] Weight:       140.2 lb Date of Birth:  January 25, 1961  BSA:          1.739 m Patient Age:    61 years   BP:           95/61 mmHg Patient Gender: F          HR:           87 bpm. Exam Location:  Inpatient Procedure: 2D Echo, Color Doppler and Cardiac Doppler Indications:    Shock  History:        Patient has prior history of Echocardiogram examinations, most                 recent 07/30/2021.  Sonographer:    Gaynell Face Referring Phys: [id number] CHI JANE ELLISON IMPRESSIONS  1. Left ventricular ejection fraction, by estimation, is 60 to 65%. The left ventricle has normal function. The left ventricle has no regional wall motion abnormalities. Left ventricular diastolic parameters were normal.  2. Right ventricular systolic function is normal. The right ventricular size is normal. There is mildly elevated pulmonary artery systolic pressure. The estimated right ventricular systolic pressure is 37.6 mmHg.  3. The mitral valve is normal in structure. Trivial mitral valve regurgitation.  4. The aortic valve is tricuspid. Aortic valve regurgitation is not visualized. No aortic stenosis is present.  5. The inferior vena cava is normal in size with greater than 50% respiratory variability, suggesting right atrial pressure of 3 mmHg. Comparison(s): No significant change since prior TTE in 07/2021. FINDINGS  Left Ventricle: Left ventricular ejection fraction, by estimation, is 60 to 65%. The left ventricle has normal function. The left ventricle has no regional wall motion abnormalities. The left ventricular internal cavity size was normal in size. There is  no left ventricular hypertrophy. Left ventricular diastolic parameters were normal. Right Ventricle: The right ventricular size is normal. No increase in right ventricular wall thickness. Right ventricular systolic function is normal. There is mildly  elevated pulmonary artery systolic pressure. The tricuspid regurgitant velocity is 2.94  m/s, and with an assumed right atrial pressure of 3 mmHg, the estimated right ventricular systolic pressure is 37.6 mmHg. Left Atrium: Left atrial size was normal in size. Right Atrium: Right atrial size was normal in size. Pericardium: There is no evidence of pericardial effusion. Mitral Valve: The mitral valve is normal in structure. Trivial mitral valve regurgitation. Tricuspid Valve: The tricuspid valve is normal in structure. Tricuspid valve regurgitation is mild. Aortic Valve: The aortic valve is tricuspid. Aortic valve regurgitation is not visualized. No aortic stenosis is present. Aortic valve mean gradient measures 4.0 mmHg. Aortic valve peak gradient measures 6.6 mmHg. Aortic valve area, by VTI measures 2.37 cm. Pulmonic Valve: The pulmonic valve was normal in structure. Pulmonic valve regurgitation is trivial. Aorta: The aortic root is normal in size and structure. Venous: The inferior vena cava is normal in size with greater than 50% respiratory variability, suggesting right atrial pressure of 3 mmHg. IAS/Shunts: The atrial septum is grossly normal.  LEFT VENTRICLE PLAX 2D LVIDd:         3.90 cm   Diastology LVIDs:         2.60 cm   LV e' medial:    7.51 cm/s LV PW:         1.30 cm   LV E/e' medial:  9.8 LV IVS:        1.00 cm  LV e' lateral:   11.60 cm/s LVOT diam:     2.00 cm   LV E/e' lateral: 6.4 LV SV:         50 LV SV Index:   29 LVOT Area:     3.14 cm  RIGHT VENTRICLE TAPSE (M-mode): 1.9 cm LEFT ATRIUM             Index        RIGHT ATRIUM          Index LA diam:        2.60 cm 1.50 cm/m   RA Area:     9.97 cm LA Vol (A2C):   31.2 ml 17.94 ml/m  RA Volume:   21.50 ml 12.36 ml/m LA Vol (A4C):   37.6 ml 21.62 ml/m LA Biplane Vol: 34.6 ml 19.90 ml/m  AORTIC VALVE AV Area (Vmax):    2.58 cm AV Area (Vmean):   2.33 cm AV Area (VTI):     2.37 cm AV Vmax:           128.00 cm/s AV Vmean:          86.000  cm/s AV VTI:            0.211 m AV Peak Grad:      6.6 mmHg AV Mean Grad:      4.0 mmHg LVOT Vmax:         105.00 cm/s LVOT Vmean:        63.900 cm/s LVOT VTI:          0.159 m LVOT/AV VTI ratio: 0.75  AORTA Ao Root diam: 3.50 cm Ao Asc diam:  3.00 cm MITRAL VALVE               TRICUSPID VALVE MV Area (PHT): 3.99 cm    TR Peak grad:   34.6 mmHg MV Decel Time: 190 msec    TR Vmax:        294.00 cm/s MV E velocity: 73.90 cm/s MV A velocity: 65.00 cm/s  SHUNTS MV E/A ratio:  1.14        Systemic VTI:  0.16 m                            Systemic Diam: 2.00 cm Laurance Flatten MD Electronically signed by Laurance Flatten MD Signature Date/Time: 12/28/2021/1:01:43 PM    Final    US LIVER DOPPLER  Result Date: 12/27/2021 CLINICAL DATA:  Right upper quadrant pain EXAM: DUPLEX ULTRASOUND OF LIVER TECHNIQUE: Color and duplex Doppler ultrasound was performed to evaluate the hepatic in-flow and out-flow vessels. COMPARISON:  MRCP 07/30/2021 FINDINGS: Liver: Hepatomegaly and hepatic steatosis. No focal lesion, mass or intrahepatic biliary ductal dilatation. Main Portal Vein size: 1.0 cm Portal Vein Velocities Main Prox:  24 cm/sec Main Mid: 21 cm/sec Main Dist:  24 cm/sec Right: Unable to obtain due to patient cooperation Left: Unable to obtain due to patient cooperation Hepatic Vein Velocities Right:  50 cm/sec Middle:  76 cm/sec Left:  26 cm/sec IVC: Present and patent with normal respiratory phasicity. Hepatic Artery Velocity:  127 cm/sec Splenic Vein Velocity:  Unable to obtain Spleen: 9.2 x 6.9 x 4.8 cm with a total volume of 157.6 cm^3 (411 cm^3 is upper limit normal) Portal Vein Occlusion/Thrombus: No Splenic Vein Occlusion/Thrombus: No Ascites: Trace perihepatic ascites Varices: None IMPRESSION: 1. The portal and hepatic veins are patent where visualized with antegrade flow. 2. Hepatomegaly and hepatic  steatosis. Electronically Signed   By: Minerva Festeryler  Stutzman M.D.   On: 12/27/2021 19:52   US Abdomen Limited RUQ  (LIVER/GB)  Result Date: 12/27/2021 CLINICAL DATA:  Right upper quadrant pain EXAM: ULTRASOUND ABDOMEN LIMITED RIGHT UPPER QUADRANT COMPARISON:  MRCP 07/30/2021 and CT abdomen and pelvis 06/29/2021 FINDINGS: Gallbladder: Cholelithiasis measuring up to 7 mm in dimension. Negative sonographic Murphy's sign. No wall thickening or pericholecystic fluid. Common bile duct: Diameter: 2.9 mm Liver: Hepatic steatosis and hepatomegaly. Portal vein is patent on color Doppler imaging with normal direction of blood flow towards the liver. Other: Trace perihepatic ascites. IMPRESSION: 1. Cholelithiasis without evidence of cholecystitis. 2. Hepatomegaly and hepatic steatosis. Electronically Signed   By: Minerva Festeryler  Stutzman M.D.   On: 12/27/2021 19:45   DG Chest Port 1 View  Result Date: 12/27/2021 CLINICAL DATA:  Chronic cough and temporary weakness x1 day. EXAM: PORTABLE CHEST 1 VIEW COMPARISON:  July 29, 2021 FINDINGS: The heart size and mediastinal contours are within normal limits. Both lungs are clear. The visualized skeletal structures are unremarkable. IMPRESSION: No active disease. Electronically Signed   By: Aram Candelahaddeus  Houston M.D.   On: 12/27/2021 17:58    Assessment and Plan: This is a 61 yr old female ( female but identifies as female) admitted with AMS, AKI, severe anemia and hyperammonemia. He has history of alcohol use, but wasn't actively drinking. He couldn't give me any history, very lethargic. We were asked to see this patient for pancytopenia, hyperbilirubinemia.  Pancytopenia. Patient has anemia at baseline. Hb now almost at baseline. Thrombocytopenia however is acute during this hospital admission and has worsened over the past few days. Low degree hemolysis. No schistocytes AKI improving. Need for pressors improved/resolved. Overall given some clinical improvement, this is not consistent with TTP.       Could this be DIC from liver disease, since INR/PTT prolonged, thrombocytopenia. Fibrinogen can be  falsely normal since its acute phase reactant. Will have to track it daily to see if it drops which can help define posisble DIC. Either way the cause of DIC needs to be treated.  ? Acute bone marrow suppression from hospitalization and liver disease. No b12/folic acid deficiency ( pt had severe macrocytosis on initial admission, this has corrected on its own in 24 hrs, not sure if these labs were very accurate) Will discuss with GI about nature of liver disease ( ? Cirrhosis) No concern for M protein, no TP/albumin dissociation noted. In an ideal setting liver biopsy will shed some light on the nature of liver disease, but she may not be a candidate at this time. Will order some additional labs for AM Will also discuss with GI if there is any role for further imaging  3. Hyperbilirubinemia, more direct than indirect. Likely from liver disease. Again, low degree hemolysis noted. Pt is able to maintain Hb. Will order some additional tests. Please consider additional imaging of the abdomen to define liver disease  Overall very complex picture with severe thrombocytopenia ( is this acute illness induced?) I will discuss with GI team and will keep primary team posted Please consider further imaging to assess liver disease if this may shed more light. Please monitor CBC, CMP, fractionated bili, Retic count daily for now. When stable, we can certainly consider outpatient evaluation for his hematological issues with one of our hem clinic team Discussed plan with primary team. Please transfuse if actively bleeding or platelet less than 20K   The length of time of the face-to-face encounter was  75 minutes. More than 50% of time was spent counseling and coordination of care.     Thank you for this referral.

## 2021-12-31 NOTE — Procedures (Addendum)
Patient Name: Laura [name]  MRN: [id number]  Epilepsy Attending: Charlsie Quest  Referring Physician/Provider: Levin Erp, MD  Date: 12/31/2021 Duration: 23.57 mins  Patient history: 61 year old with altered mental status.  EEG to evaluate for seizure.  Level of alertness: lethargic   AEDs during EEG study: None  Technical aspects: This EEG study was done with scalp electrodes positioned according to the 10-20 International system of electrode placement. Electrical activity was reviewed with band pass filter of 1-70Hz , sensitivity of 7 uV/mm, display speed of 73mm/sec with a 60Hz  notched filter applied as appropriate. EEG data were recorded continuously and digitally stored.  Video monitoring was available and reviewed as appropriate.  Description: EEG showed continuous generalized 3-5hz  theta-delta slowing.  Generalized periodic discharges with triphasic morphology were noted at 1 Hz.  Hyperventilation and photic stimulation were not performed.     ABNORMALITY - Periodic discharges with triphasic morphology, generalized ( GPDs) - Continuous slow, generalized  IMPRESSION: This study showed generalized periodic discharges with triphasic morphology at 1 Hz.  At times, this EEG pattern can be on the ictal pattern interictal continuum.  However given the morphology and frequency this is more commonly indicative of toxic-metabolic causes like cefepime toxicity.  Additionally there is moderate to severe diffuse encephalopathy. No definite seizures were seen during the study.   Laura [name] 

## 2021-12-31 NOTE — Progress Notes (Signed)
Spoke with Dr. Al Pimple with hematology oncology about consult for patient's worsening thrombocytopenia and hematologic labs. Dr. Al Pimple will see patient later today. Ordered fractionated bilirubin, stat retic count, iron panel, B12 and folate per request.  Attempted to call girlfriend [name] x 2, no answer.

## 2021-12-31 NOTE — Progress Notes (Addendum)
eLink Physician-Brief Progress Note Patient Name: Laura [name] DOB: 03-17-1961 MRN: [id number]   Date of Service  12/31/2021  HPI/Events of Note  Hem-onc note reviewed.   eICU Interventions  Fractionated bili and retic count ordered as per Hem-Onc recommendations.      Intervention Category Minor Interventions: Other:  Larinda Buttery 12/31/2021, 10:43 PM

## 2021-12-31 NOTE — Progress Notes (Addendum)
NAME:  Laura [name], MRN:  [id number], DOB:  [birth year], LOS: 4 ADMISSION DATE:  12/27/2021, CONSULTATION DATE:  12/27/21 REFERRING MD:  Dr. Myrlene Broker / EDP, CHIEF COMPLAINT:  Fall    History of Present Illness:  61 y/o patient who presented to [facility] ER on 9/9 via EMS with reports of weakness and fall.    The patient was admitted in April 2023 with symptomatic anemia, hypotension, SVT, electrolyte disturbances, UTI & conjunctivitis.  He was seen by Hematology during that admission and his anemia was felt to be related to anemia of chronic disease in the setting of liver disease. He was also seen by GI and was planned to follow up with GI as outpatient but it does not appear he was able to follow up.    Laura [name] identifies as female, lives in an apartment.  His cousin assists in his medical care at home.  He reportedly has had progressive weakness.  He attempted to go to the bathroom and fell.  He apparently was on the floor for at least a day.  EMS was activated and found the patient to be hypotensive with SBP in 60's and in SVT.  He was apparently difficult to stick for an IV. The patient reports he continues to smoke 1/2ppd, quit drinking approximately 7 months ago. Per his cousin, he has had episodes of incontinence with moisture associated rash on peri area.  In ER, the patient required O2 to maintain saturations.    Initial ER evaluation notable for recurrent episodes of SVT, hypotension.  Labs -Na 136, K 3.1, Cl 96, glucose 75, BUN 29, Cr 2.14, calcium 8.5, AG 20, albumin 2.3, AST 99 / ALT 36, ammonia 60, TSH 2.8, lactic acid 4, WBC 5.5, Hgb 5, Hct 15.5, MCV 140, platelets 77 & INR 1.5.  CXR negative. COVID negative. Blood & urine cultures pending.   PCCM called for ICU admission.    Pertinent  Medical History  Chronic Back Pain  Tobacco Abuse  ETOH Abuse - reportedly quit Feb 2023 Insomnia  Laminectomy   Significant Hospital Events: Including procedures, antibiotic start and stop dates in  addition to other pertinent events   9/9 Admit s/p mechanical fall  9/11 Hgb 6.8 > 1U pRBC  Interim History / Subjective:  Off of Levophed last night.  Patient still unable to follow commands/encephalopathic  Objective   Blood pressure 90/61, pulse 68, temperature 97.9 F (36.6 C), temperature source Oral, resp. rate 15, weight 72.7 kg, SpO2 95 %.        Intake/Output Summary (Last 24 hours) at 12/31/2021 0834 Last data filed at 12/31/2021 0815 Gross per 24 hour  Intake 712.38 ml  Output 125 ml  Net 587.38 ml    Filed Weights   12/27/21 1920 12/27/21 2126 12/31/21 0500  Weight: 68 kg 63.6 kg 72.7 kg   Physical Exam: General: NAD, responds to voice but confused, oriented to time and person  HENT: NCAT, some tenderness with palpation of neck, Eyes: Equal pupils, reactive to light, scleral icterus present, unable to examine EOM Respiratory: Clear to auscultation bilaterally, no wheezes rales or crackles  Cardiovascular: RRR no murmurs rubs or gallops GI: nontender, soft no fluid wave on examination Extremities: No LE edema Neuro: Is not responsive to questions immediately, cannot follow commands well Psych: Altered  Sodium 131 from 131 K4.6 from 4.8 Creatinine 1.53 from 1.42 AST 43 from 40 ALT 23 from 25 T. bili 13.3 from 14.8 WBC 10.5 from 8 Hemoglobin 7.9 from 8.4  Platelets 19 from 18 PT 18.6 from 16.7 INR 1.6 from 1.4 CBGs 87-1 02 Bcx grew Staphylococcus on result 9/12 at 1430 [facility] Problem list     Assessment & Plan:  Encephalopathy Unclear etiology, possibly from sepsis or liver dysfunction.  Worsening today -CT head -Check ammonia level -Consider neurology consultation if worsening -Reach out to family for understanding baseline  Acute hypoxemic respiratory failure  CXR today with new bibasilar infiltrates. On 5 L currently.  -Wean supplemental O2 for goal >88% -continue cefepime  Septic shock. Cannot rule out sepsis or cardiogenic  shock. Low suspicion for active bleed. Sepsis could be from skin wounds or urinary incontinence or new pulmonary source.  Blood culture did grow Staphylococcus species in 1/4 -Continue Cefepime, consider broadening  -Levophed was weaned off at 9/12 at 2000 -Trend LA -Wound care  Symptomatic Anemia  Anemia of Chronic Disease  Coagulopathy  Similar admission in April 2023, felt to be related to chronic disease / liver.  INR 1.5 on admission.  DIC panel yesterday without signs of hemolysis/no schistocytes on smear and fibrinogen normal.Haptoglobin was 69.  And LDH was mildly elevated at 203. S/p PRBC x 3  -Trend CBC for Hg goal >7 -PPI BID -thiamine, folate IV -GI signed off yesterday, believe likely secondary to liver issues -Consider hematology oncology consult today  Thrombocytopenia 19 from 18 today -Trend -Transfuse for goal >10 -Will talk to hem onc today  SVT Trop peak 35. HR has been 60s to 70s. -Telemetry -goal K+>4, Mg+ >2  -Vasovagal maneuvers PRN  AKI  Mild Rhabdomyolysis - improving In setting of volume depletion, downtime of ~ 1 day -Trend BMP / urinary output -Replace electrolytes as indicated -Avoid nephrotoxic agents, ensure adequate renal perfusion  Hypokalemia, Hypomagnesemia  -Trend  -Replete as needed  Hx ETOH abuse Hepatomegaly/hepatic steatosis Elevated Bilirubin  Suspect this is end stage liver disease  -Trend LFTs, INR -Thiamine, folic acid  Conjunctivitis  -cipro eye drops course complete today  Adult Failure to Thrive  -Palliative Care Consult  -Pt request full code on admission.  Feel he has poor insight into his medical care.  He states if he could not make decisions for himself, he would want [name] [phone] to make decisions for him and his goddaughter   Skin wounds on buttocks, perineum, sacrum, POA -Wound care consult  Best Practice (right click and "Reselect all SmartList Selections" daily)  Diet/type: Regular  consistency (see orders) DVT prophylaxis: SCD GI prophylaxis: PPI Lines: N/A Foley:  N/A Code Status:  full code Last date of multidisciplinary goals of care discussion: 9/9, confirmed full code   Critical care time: 60 minutes    Levin Erp, MD FM PGY-2 [place] [facility] 12/31/2021 8:34 AM   Please see Amion for pager number to reach on-call Pulmonary and Critical Care Team.

## 2021-12-31 NOTE — Hospital Course (Signed)
Laura [name]  Dbili--  No evidence of DIC More than likely

## 2021-12-31 NOTE — Progress Notes (Unsigned)
Pt did not come to appt  Admitted to hospital

## 2021-12-31 NOTE — Progress Notes (Signed)
EEG complete - results pending 

## 2021-12-31 NOTE — Progress Notes (Signed)
eLink Physician-Brief Progress Note Patient Name: Laura [name] DOB: 1961/01/17 MRN: [id number]   Date of Service  12/31/2021  HPI/Events of Note  Pt screaming out in pain, unable to communicate where the pain is from.  HE has chronic pain and is on gabapentin and percocet at home.  He is NPO.   eICU Interventions  Morphine IV PRN ordered.     Intervention Category Intermediate Interventions: Pain - evaluation and management  Larinda Buttery 12/31/2021, 8:39 PM

## 2022-01-01 ENCOUNTER — Inpatient Hospital Stay (HOSPITAL_COMMUNITY): Payer: Medicare Other

## 2022-01-01 DIAGNOSIS — D696 Thrombocytopenia, unspecified: Secondary | ICD-10-CM

## 2022-01-01 DIAGNOSIS — D649 Anemia, unspecified: Secondary | ICD-10-CM | POA: Diagnosis not present

## 2022-01-01 DIAGNOSIS — D508 Other iron deficiency anemias: Secondary | ICD-10-CM | POA: Diagnosis not present

## 2022-01-01 DIAGNOSIS — K7031 Alcoholic cirrhosis of liver with ascites: Secondary | ICD-10-CM | POA: Diagnosis not present

## 2022-01-01 DIAGNOSIS — K7011 Alcoholic hepatitis with ascites: Secondary | ICD-10-CM | POA: Diagnosis not present

## 2022-01-01 LAB — GLUCOSE, CAPILLARY
Glucose-Capillary: 77 mg/dL (ref 70–99)
Glucose-Capillary: 70 mg/dL (ref 70–99)
Glucose-Capillary: 259 mg/dL — ABNORMAL HIGH (ref 70–99)
Glucose-Capillary: 150 mg/dL — ABNORMAL HIGH (ref 70–99)
Glucose-Capillary: 83 mg/dL (ref 70–99)
Glucose-Capillary: 96 mg/dL (ref 70–99)

## 2022-01-01 LAB — CBC
HCT: 25.1 % — ABNORMAL LOW (ref 36.0–46.0)
HCT: 21.5 % — ABNORMAL LOW (ref 36.0–46.0)
Hemoglobin: 8.6 g/dL — ABNORMAL LOW (ref 12.0–15.0)
Hemoglobin: 7.1 g/dL — ABNORMAL LOW (ref 12.0–15.0)
MCH: 33.5 pg (ref 26.0–34.0)
MCH: 32.6 pg (ref 26.0–34.0)
MCHC: 34.3 g/dL (ref 30.0–36.0)
MCHC: 33 g/dL (ref 30.0–36.0)
MCV: 97.7 fL (ref 80.0–100.0)
MCV: 98.6 fL (ref 80.0–100.0)
Platelets: 33 10*3/uL — ABNORMAL LOW (ref 150–400)
Platelets: 22 10*3/uL — CL (ref 150–400)
RBC: 2.57 MIL/uL — ABNORMAL LOW (ref 3.87–5.11)
RBC: 2.18 MIL/uL — ABNORMAL LOW (ref 3.87–5.11)
WBC: 16.9 10*3/uL — ABNORMAL HIGH (ref 4.0–10.5)
WBC: 15.2 10*3/uL — ABNORMAL HIGH (ref 4.0–10.5)
nRBC: 0.2 % (ref 0.0–0.2)
nRBC: 0.2 % (ref 0.0–0.2)

## 2022-01-01 LAB — RETICULOCYTES
Immature Retic Fract: 22.4 % — ABNORMAL HIGH (ref 2.3–15.9)
RBC.: 2.56 MIL/uL — ABNORMAL LOW (ref 3.87–5.11)
Retic Count, Absolute: 87.2 10*3/uL (ref 19.0–186.0)
Retic Ct Pct: 3.4 % — ABNORMAL HIGH (ref 0.4–3.1)

## 2022-01-01 LAB — COMPREHENSIVE METABOLIC PANEL
ALT: 25 U/L (ref 0–44)
AST: 46 U/L — ABNORMAL HIGH (ref 15–41)
Albumin: 1.5 g/dL — ABNORMAL LOW (ref 3.5–5.0)
Alkaline Phosphatase: 105 U/L (ref 38–126)
Anion gap: 9 (ref 5–15)
BUN: 46 mg/dL — ABNORMAL HIGH (ref 6–20)
CO2: 18 mmol/L — ABNORMAL LOW (ref 22–32)
Calcium: 8.5 mg/dL — ABNORMAL LOW (ref 8.9–10.3)
Chloride: 104 mmol/L (ref 98–111)
Creatinine, Ser: 1.75 mg/dL — ABNORMAL HIGH (ref 0.44–1.00)
GFR, Estimated: 33 mL/min — ABNORMAL LOW (ref >60)
Glucose, Bld: 109 mg/dL — ABNORMAL HIGH (ref 70–99)
Potassium: 4.5 mmol/L (ref 3.5–5.1)
Sodium: 131 mmol/L — ABNORMAL LOW (ref 135–145)
Total Bilirubin: 13 mg/dL — ABNORMAL HIGH (ref 0.3–1.2)
Total Protein: 4.8 g/dL — ABNORMAL LOW (ref 6.5–8.1)

## 2022-01-01 LAB — CULTURE, BLOOD (ROUTINE X 2)
Culture: NO GROWTH
Special Requests: ADEQUATE
Special Requests: ADEQUATE

## 2022-01-01 LAB — LACTIC ACID, PLASMA: Lactic Acid, Venous: 0.7 mmol/L (ref 0.5–1.9)

## 2022-01-01 LAB — DIC (DISSEMINATED INTRAVASCULAR COAGULATION)PANEL
D-Dimer, Quant: 2.06 ug/mL-FEU — ABNORMAL HIGH (ref 0.00–0.50)
Fibrinogen: 314 mg/dL (ref 210–475)
INR: 1.3 — ABNORMAL HIGH (ref 0.8–1.2)
Platelets: 26 10*3/uL — CL (ref 150–400)
Prothrombin Time: 16 seconds — ABNORMAL HIGH (ref 11.4–15.2)
Smear Review: NONE SEEN
aPTT: 50 seconds — ABNORMAL HIGH (ref 24–36)

## 2022-01-01 LAB — PROTIME-INR
INR: 1.3 — ABNORMAL HIGH (ref 0.8–1.2)
Prothrombin Time: 16.5 seconds — ABNORMAL HIGH (ref 11.4–15.2)

## 2022-01-01 LAB — DIRECT ANTIGLOBULIN TEST (NOT AT ARMC)
DAT, IgG: NEGATIVE
DAT, complement: NEGATIVE

## 2022-01-01 LAB — BILIRUBIN, FRACTIONATED(TOT/DIR/INDIR)
Bilirubin, Direct: 6.1 mg/dL — ABNORMAL HIGH (ref 0.0–0.2)
Indirect Bilirubin: 4.3 mg/dL — ABNORMAL HIGH (ref 0.3–0.9)
Total Bilirubin: 10.4 mg/dL — ABNORMAL HIGH (ref 0.3–1.2)

## 2022-01-01 LAB — PHOSPHORUS: Phosphorus: 2.4 mg/dL — ABNORMAL LOW (ref 2.5–4.6)

## 2022-01-01 LAB — MAGNESIUM: Magnesium: 2.1 mg/dL (ref 1.7–2.4)

## 2022-01-01 MED ORDER — DEXTROSE 5 % IV SOLN: INTRAVENOUS | Status: DC

## 2022-01-01 MED ORDER — ALBUMIN HUMAN 25 % IV SOLN: 75.0000 g | Freq: Once | INTRAVENOUS | Status: DC

## 2022-01-01 MED ORDER — ALBUMIN HUMAN 25 % IV SOLN
25.0000 g | Freq: Once | INTRAVENOUS | Status: AC
  Administered 2022-01-01: 25 g via INTRAVENOUS
  Filled 2022-01-01: qty 100

## 2022-01-01 MED ORDER — LACTULOSE 10 GM/15ML PO SOLN
10.0000 g | Freq: Two times a day (BID) | ORAL | Status: DC
  Administered 2022-01-01: 10 g via ORAL
  Filled 2022-01-01: qty 15

## 2022-01-01 MED ORDER — VANCOMYCIN HCL 1500 MG/300ML IV SOLN
1500.0000 mg | INTRAVENOUS | Status: DC
  Filled 2022-01-01: qty 300

## 2022-01-01 MED ORDER — PROSOURCE TF20 ENFIT COMPATIBL EN LIQD
60.0000 mL | Freq: Two times a day (BID) | ENTERAL | Status: DC
  Administered 2022-01-01 – 2022-01-14 (×23): 60 mL
  Filled 2022-01-01 (×24): qty 60

## 2022-01-01 MED ORDER — MIDODRINE HCL 5 MG PO TABS
15.0000 mg | ORAL_TABLET | Freq: Three times a day (TID) | ORAL | Status: DC
  Administered 2022-01-02 – 2022-01-15 (×36): 15 mg
  Filled 2022-01-01 (×40): qty 3

## 2022-01-01 MED ORDER — LACTULOSE 10 GM/15ML PO SOLN
10.0000 g | Freq: Two times a day (BID) | ORAL | Status: DC
  Administered 2022-01-02 – 2022-01-14 (×22): 10 g
  Filled 2022-01-01 (×23): qty 15

## 2022-01-01 MED ORDER — VANCOMYCIN HCL 1500 MG/300ML IV SOLN
1500.0000 mg | Freq: Once | INTRAVENOUS | Status: AC
  Administered 2022-01-01: 1500 mg via INTRAVENOUS
  Filled 2022-01-01: qty 300

## 2022-01-01 MED ORDER — ALBUMIN HUMAN 25 % IV SOLN
50.0000 g | Freq: Once | INTRAVENOUS | Status: AC
  Administered 2022-01-01: 50 g via INTRAVENOUS
  Filled 2022-01-01: qty 200

## 2022-01-01 MED ORDER — ALBUMIN HUMAN 25 % IV SOLN
75.0000 g | Freq: Once | INTRAVENOUS | Status: DC
  Filled 2022-01-01: qty 300

## 2022-01-01 MED ORDER — DOCUSATE SODIUM 50 MG/5ML PO LIQD: 100.0000 mg | Freq: Two times a day (BID) | ORAL | Status: DC | PRN

## 2022-01-01 MED ORDER — OSMOLITE 1.5 CAL PO LIQD
1000.0000 mL | ORAL | Status: DC
  Administered 2022-01-01 – 2022-01-02 (×2): 1000 mL
  Filled 2022-01-01: qty 1000

## 2022-01-01 NOTE — Progress Notes (Addendum)
I went into the room to discuss goals of care further with girlfriend Bonita Quin and [name]. I discussed with Bonita Quin that prognosis is poor and that we are on medications like levophed that are trying to help with renal perfusion and that his liver is is not working well. I discussed that there are options for hospice/palliative care at home.  Bonita Quin was following and understood the conversation but says that ultimately it is up to [name] to decide. [name] had his eyes closed during entire conversation while I was in the room and did not participate. I also tried to understand what baseline was like for Michael at home.  Bonita Quin says that at home he walks on his own and feeds himself and that earlier this year had had that decline but at home is usually normal self.   I called and discussed patient with brother Tatyana Biber.  I explained situation of hospitalization and point that we are at right now with the patient likely in hepatorenal syndrome.  I discussed that some of these medications are not medications that will improve outcomes/prognosis for Michael.  I discussed that these are not medications that he can go home with.  Discussed that it would be important for the whole family to come and have a palliative/goals of care conversation.  He is not available to come tomorrow but he would be available on Saturday to discuss further. I discussed the options of hospice/palliative care and that the services could provide care at home as well.  I also queried what his baseline was home to which Molly Maduro responded that he does not live with him and that he is unsure what he is like at home.  I asked whether he usually feeds himself/walks by himself and he says that he is unsure. Molly Maduro understood what we talked about and was thankful for the call.

## 2022-01-01 NOTE — Progress Notes (Deleted)
I went into the room to discuss goals of care further with girlfriend Bonita Quin and [name]. I discussed with Bonita Quin that prognosis is poor and that we are on medications like levophed that are trying to help with renal perfusion and that his liver is is not working well. I discussed that there are options for hospice/palliative care at home.  Bonita Quin was following and understood the conversation but says that ultimately it is up to [name] to decide. [name] had his eyes closed during entire conversation while I was in the room and did not participate.  He also tried to understand what baseline was like for Michael at home.  Bonita Quin says that at home he walks on his own and feeds himself and that earlier this year had had that decline but at home is usually normal self.  I called and discussed patient with brother Eshani Maestre.  I explained situation of hospitalization and point that we are at right now with the patient likely in hepatorenal syndrome.  I discussed that some of these medications are not medications that will improve outcomes/prognosis for Michael.  I discussed that these are not medications that he can go home with.  Discussed that it would be important for the whole family to come and have a palliative/goals of care conversation.  He is not available to come tomorrow but he would be available on Saturday to discuss further. I discussed the options of hospice/palliative care and that the services could provide care at home as well.  I also queried what his baseline was home to which Molly Maduro responded that he does not live with him and that he is unsure what he is like at home.  I asked whether he usually feeds himself/walks by himself and he says that he is unsure. Molly Maduro understood what we talked about and was thankful for the call.

## 2022-01-01 NOTE — Progress Notes (Addendum)
NAME:  Laura [name], MRN:  [id number], DOB:  08-24-1960, LOS: 5 ADMISSION DATE:  12/27/2021, CONSULTATION DATE:  12/27/21 REFERRING MD:  Dr. Myrlene [name] / EDP, CHIEF COMPLAINT:  Fall    History of Present Illness:  61 y/o patient who presented to [name] on 9/9 via EMS with reports of weakness and fall.    The patient was admitted in April 2023 with symptomatic anemia, hypotension, SVT, electrolyte disturbances, UTI & conjunctivitis.  He was seen by Hematology during that admission and his anemia was felt to be related to anemia of chronic disease in the setting of liver disease. He was also seen by GI and was planned to follow up with GI as outpatient but it does not appear he was able to follow up.    Laura [name] identifies as female, lives in an apartment.  His cousin assists in his medical care at home.  He reportedly has had progressive weakness.  He attempted to go to the bathroom and fell.  He apparently was on the floor for at least a day.  EMS was activated and found the patient to be hypotensive with SBP in 60's and in SVT.  He was apparently difficult to stick for an IV. The patient reports he continues to smoke 1/2ppd, quit drinking approximately 7 months ago. Per his cousin, he has had episodes of incontinence with moisture associated rash on peri area.  In ER, the patient required O2 to maintain saturations.    Initial ER evaluation notable for recurrent episodes of SVT, hypotension.  Labs -Na 136, K 3.1, Cl 96, glucose 75, BUN 29, Cr 2.14, calcium 8.5, AG 20, albumin 2.3, AST 99 / ALT 36, ammonia 60, TSH 2.8, lactic acid 4, WBC 5.5, Hgb 5, Hct 15.5, MCV 140, platelets 77 & INR 1.5.  CXR negative. COVID negative. Blood & urine cultures pending.   PCCM called for ICU admission.    Pertinent  Medical History  Chronic Back Pain  Tobacco Abuse  ETOH Abuse - reportedly quit Feb 2023 Insomnia  Laminectomy   Significant Hospital Events: Including procedures, antibiotic start and stop dates in  addition to other pertinent events   9/9 Admit s/p mechanical fall  9/11 Hgb 6.8 > 1U pRBC 9/13 continued thrombocytopenia, hem onc consulted, EEG severe encephalopathy/cefepime toxicity  Interim History / Subjective:  Required levophed from 9/13 2200- 9/14 0400.  Patient alert and responsive but did not want to talk about CODE STATUS/palliative or hospice care.  We will discuss with palliative team as well.  Objective   Blood pressure (!) 92/53, pulse (!) 58, temperature 97.6 F (36.4 C), temperature source Axillary, resp. rate 11, weight 75.2 kg, SpO2 100 %.        Intake/Output Summary (Last 24 hours) at 01/01/2022 0707 Last data filed at 01/01/2022 0600 Gross per 24 hour  Intake 949.52 ml  Output 280 ml  Net 669.52 ml    Filed Weights   12/27/21 2126 12/31/21 0500 01/01/22 0331  Weight: 63.6 kg 72.7 kg 75.2 kg   Physical Exam: General: NAD, laying in bed, more responsive to questions HENT: NCAT, MMM Eyes: Equal and symmetric Respiratory: CTAB no w/r/c, no iWOB on 2 L Cardiovascular: RRR no m/r/g GI: Mildly distended, nontender Extremities: No LE edema but still tender to palpation on exma Neuro: responsive to voice/commands  Sodium 131 from 131 K4.5 from 4.6 Mag 2.1 Creatinine 1.75 from 1.53 AST 46 from 43 ALT 25 from 23 T. bili 13.0 from 13.3 WBC 16.9  from 11.2 Hemoglobin 8.6 from 7.6 Platelets 33 from 20 PT 16.5 from 18.6 INR  1.3 from 1.6 CBGs 87-109 MRSA swab neg Bcx grew Staphylococcus on result 9/12 at 1430 1/4 Ucx sent   Resolved Hospital Problem list     Assessment & Plan:  Encephalopathy Unclear etiology, possibly from sepsis or liver dysfunction.  Cefepime discontinued based on EEG results yesterday and transitioned to ceftriaxone. Ammonia 45. CT head without acute issues -Consider neurology consultation if worsening -Reach out to family for understanding baseline  Acute hypoxemic respiratory failure  CXR with new bibasilar  infiltrates--likely atelectasis. On 2 L currently.  -Wean supplemental O2 for goal >88% -continue cefepime -incentive spirometry  Septic shock. Cannot rule out sepsis or cardiogenic shock. Low suspicion for active bleed. Sepsis could be from skin wounds or urinary incontinence or new pulmonary source.  Blood culture did grow Staphylococcus species in 1/4 likely contaminant -Continue ceftriaxone -Levophed was weaned off again 9/14 at 0400 -Wound care  Hepatorenal syndrome Given liver disease and worsening kidney function. -Albumin today  Symptomatic Anemia  Anemia of Chronic Disease  Coagulopathy  Similar admission in April 2023, felt to be related to chronic disease / liver.  INR 1.5 on admission.  Based on labs, no obvious hemolysis but continuing to monitor. Hematology following  S/p PRBC x 3  -Trend CBC for Hg goal >7 -PPI BID -thiamine, folate IV -GI signed off yesterday, believe likely secondary to liver issues -Consider hematology oncology consult today  Thrombocytopenia 33 from 20 today -Trend -Transfuse for goal >20 per hematology -Hematology following, appreciate recs  SVT Trop peak 35. HR has been 60s to 70s. -Telemetry -goal K+>4, Mg+ >2  -Vasovagal maneuvers PRN  AKI  Oliguria Mild Rhabdomyolysis - improving In setting of volume depletion, downtime of ~ 1 day. Worsening today, slightly more urine output  -Trend BMP / urinary output -Replace electrolytes as indicated -Avoid nephrotoxic agents, ensure adequate renal perfusion  Hypokalemia, Hypomagnesemia  -Trend  -Replete as needed  Hx ETOH abuse Hepatomegaly/hepatic steatosis Elevated Bilirubin  Suspect this is end stage liver disease  -Trend LFTs, INR -Thiamine hog, folic acid  Conjunctivitis  -cipro eye drops course complete today  Adult Failure to Thrive  -Palliative Care Consult  -Pt request full code on admission.  Feel he has poor insight into his medical care.  He states if he could not  make decisions for himself, he would want [name] [phone] to make decisions for him and his goddaughter  -We will touch base with family again -Cortrak ordered  Skin wounds on buttocks, perineum, sacrum, POA -Wound care consult  Best Practice (right click and "Reselect all SmartList Selections" daily)  Diet/type: Regular consistency (see orders) DVT prophylaxis: SCD GI prophylaxis: PPI Lines: N/A Foley:  N/A Code Status:  full code Last date of multidisciplinary goals of care discussion: 9/9, confirmed full code   Critical care time: 60 minutes    Levin Erp, MD FM PGY-2  Pulmonary/Critical Care Medicine 01/01/2022 7:07 AM   Please see Amion for pager number to reach on-call Pulmonary and Critical Care Team.

## 2022-01-01 NOTE — Procedures (Signed)
Cortrak  Person Inserting Tube:  Clovis Riley, Kanani Mowbray L, RD Tube Type:  Cortrak - 43 inches Tube Size:  10 Tube Location:  Left nare Secured by: Bridle Technique Used to Measure Tube Placement:  Marking at nare/corner of mouth Cortrak Secured At:  62 cm   Cortrak Tube Team Note:  Consult received to place a Cortrak feeding tube.   X-ray is required, abdominal x-ray has been ordered by the Cortrak team. Please confirm tube placement before using the Cortrak tube.   If the tube becomes dislodged please keep the tube and contact the Cortrak team at [email or web address] ([email or web address]) for replacement.  If after hours and replacement cannot be delayed, place a NG tube and confirm placement with an abdominal x-ray.    Kirby Crigler RD, LDN Clinical Dietitian See Loretha Stapler for contact information.

## 2022-01-01 NOTE — Progress Notes (Signed)
Initial Nutrition Assessment  DOCUMENTATION CODES:   Not applicable  INTERVENTION:  Initiate tube feeding via Cortrak (tip in proximal stomach): Start Osmolite 1.5 at 63ml and advanced by 60ml q6h until a goal rate of 52ml/hr (1200 ml per day) Prosource TF20 60 ml BID  Provides 1960 kcal, 115 gm protein, 914 ml free water daily  NUTRITION DIAGNOSIS:   Increased nutrient needs related to acute illness, wound healing as evidenced by estimated needs.  GOAL:   Patient will meet greater than or equal to 90% of their needs  MONITOR:   PO intake, Supplement acceptance, Diet advancement, Labs, Weight trends, Skin  REASON FOR ASSESSMENT:   Consult Enteral/tube feeding initiation and management  ASSESSMENT:   Pt admitted with weakness, septic shock and SVT. PMH significant for chronic anemia and hx EtOH use   Shock resolved. Sepsis with unclear etiology. Hypotension possibly r/t cirrhosis pathology. Pt with FTT. Pending palliative care consult and GOC.   Food allergies: garlic  Spoke with Charity fundraiser. Reports that pt had difficulty swallowing medication yesterday and was subsequently made NPO for safety. Cortrak order placed. Pt not pleased with this however and was refusing PT and to speak with RD.  Attempted to obtain nutrition related history from pt. He would not respond to questions and was tearful. Attempted to offer tissue and help wipe his tears but pt refused. Obtained nutrition physical exam and covered with blanket as he was shivering and reported that he was cold.   Reviewed weight history. It appears his weight began trending down between March to June and experienced an 18.7% weight loss within this time frame. However his weight appears to have increased 5.3 kg up to this admission.   Edema: mild pitting BLE  Medications: folic acid, lactulose, medihoney, protonix IV drips: NaCl @10ml /hr, rocephin, D5 @ 77ml/hr, thimaine, vanc  Labs: Na 131, BUN 46, Cr 1.75, AST 46,  ammonia 45, Tbili 13.0, GFR 33, CBG's 70-85x24 hours  UOP: 45m x24 hours I/O's: +9421ml since admission  NUTRITION - FOCUSED PHYSICAL EXAM:  Flowsheet Row Most Recent Value  Orbital Region No depletion  Upper Arm Region No depletion  Thoracic and Lumbar Region No depletion  Buccal Region No depletion  Temple Region No depletion  Clavicle Bone Region No depletion  Clavicle and Acromion Bone Region Mild depletion  Scapular Bone Region Mild depletion  Dorsal Hand Mild depletion  Patellar Region No depletion  Anterior Thigh Region Mild depletion  Posterior Calf Region No depletion  Edema (RD Assessment) None  Hair Reviewed  Eyes Unable to assess  Mouth Unable to assess  Skin Reviewed  Nails Reviewed      Diet Order:   Diet Order             Diet NPO time specified  Diet effective now                   EDUCATION NEEDS:   No education needs have been identified at this time  Skin:  Skin Assessment: Skin Integrity Issues: Skin Integrity Issues:: DTI DTI: bilat buttocks  Last BM:  9/11  Height:   Ht Readings from Last 1 Encounters:  01/01/22 5\' 7"  (1.702 m)   Weight:   Wt Readings from Last 1 Encounters:  01/01/22 75.2 kg   BMI:  Body mass index is 25.97 kg/m.  Estimated Nutritional Needs:   Kcal:  1800-2000  Protein:  100-115g  Fluid:  >/=1.8L  , RDN, LDN Clinical Nutrition

## 2022-01-01 NOTE — Progress Notes (Addendum)
Pharmacy Antibiotic Note  [name] is a 61 y.o. adult for which pharmacy has been consulted for cefepime dosing for sepsis.  Patient with a history of chronic back pain, HTN, HLD, hypokalemia, anemia, transaminitis. Patient presenting after being found unresponsive.   Patient has remained afebrile in the last 24 hours, however WBC elevated 11.2 >> 16.9. 1/2 blood cultures from 9/9 growing staphylococcus warneri (probable contaminant). With ongoing vasopressor requirements concerning for infectious etiology, will repeat blood cultures.  Patient with altered mental status consistent with cefepime toxicity per neurology. Patient has documented PCN allergy but has previously tolerated Keflex. Antibiotics switched to ceftriaxone on 9/13, prn diphenhydramine added for hives. No rashes or allergic reactions noted.  Renal function worsening 1.53 >> 1.75 (BL ~0.6-0.7). Calculated creatinine clearance is 36 mL/min.   Plan: Continue Ceftriaxone 2g IV Q24h Give vancomycin IV 1500 mg x1 dose Start vancomycin 1500 mg IV Q48hrs (Scr 1.75, eAUC 429) F/u LOT - planning on 5-7 day course pending vasopressor requirement.  F/u cultures, clinical course, WBC, fever De-escalate when able  Weight: 75.2 kg (165 lb 12.6 oz)  Temp (24hrs), Avg:97.8 F (36.6 C), Min:97.6 F (36.4 C), Max:98 F (36.7 C)  Recent Labs  Lab 12/27/21 1649 12/27/21 1650 12/27/21 1839 12/28/21 0321 12/28/21 1039 12/28/21 1634 12/28/21 1850 12/28/21 2307 12/29/21 0310 12/29/21 1021 12/30/21 0310 12/30/21 1910 12/31/21 0524 12/31/21 1223 12/31/21 1737 01/01/22 0256  WBC  --    < >  --    < > 4.9  --    < > 7.4  --    < > 8.4  8.4 8.0 10.5 12.3* 11.2* 16.9*  CREATININE  --    < >  --    < >  --   --   --   --  1.43*  --  1.34* 1.42* 1.53*  --   --  1.75*  LATICACIDVEN 4.0*  --  4.9*  --  1.4 2.0*  --  1.2  --   --   --   --   --   --   --   --    < > = values in this interval not displayed.     Estimated Creatinine  Clearance (by C-G formula based on SCr of 1.75 mg/dL (H)) Female: 94.8 mL/min (A) Female: 42.5 mL/min (A)    Allergies  Allergen Reactions   Garlic Anaphylaxis   Aspirin Hives   Lyrica [Pregabalin]     Nightmares, did not help neuropathy   Penicillins Hives    Antimicrobials this admission: cefepime 9/9 >> 9/13 vancomycin 9/9 >>9/11; 9/14 >> CTX 9/13 >>   Microbiology results: 9/9 BCx >> staph warneri 1/3 (probable contaminant) 9/10 MRSA PCR negative 9/9 UCx >> not collected  9/14 Bcx >>   Thank you for allowing pharmacy to be a part of this patient's care.  Rockwell Alexandria, PharmD, [facility] PGY1 Pharmacy Resident 01/01/2022 10:48 AM

## 2022-01-01 NOTE — Progress Notes (Signed)
Laura [name]   DOB:Jul 18, 1960   HY#:[id number]   [phone]  Subjective:   Patient is more awake today.  However he does not talk very much.  He nods his head and answers yes or no to questions. No bleeding complaints.  Objective:  Vitals:   01/01/22 1200 01/01/22 1300  BP: (!) 89/77 96/67  Pulse: 64 69  Resp: 14 19  Temp:    SpO2: 96% 97%    Body mass index is 25.97 kg/m.  Intake/Output Summary (Last 24 hours) at 01/01/2022 1720 Last data filed at 01/01/2022 1200 Gross per 24 hour  Intake 748.36 ml  Output 200 ml  Net 548.36 ml    Sclerae icteric Severe bilateral lower extremity swelling. No bleeding noted from IV lines  CBG (last 3)  Recent Labs    01/01/22 0730 01/01/22 1128 01/01/22 1712  GLUCAP 70 259* 150*     Labs:  Lab Results  Component Value Date   WBC 16.9 (H) 01/01/2022   HGB 8.6 (L) 01/01/2022   HCT 25.1 (L) 01/01/2022   MCV 97.7 01/01/2022   PLT 26 (LL) 01/01/2022   NEUTROABS 6.7 12/30/2021    '@LASTCHEMISTRY' @  Urine Studies No results for input(s): "UHGB", "CRYS" in the last 72 hours.  Invalid input(s): "UACOL", "UAPR", "USPG", "UPH", "UTP", "UGL", "UKET", "UBIL", "UNIT", "UROB", "ULEU", "UEPI", "UWBC", "URBC", "UBAC", "CAST", "UCOM", "BILUA"  Basic Metabolic Panel: Recent Labs  Lab 12/27/21 1650 12/27/21 1751 12/29/21 0310 12/30/21 0310 12/30/21 1910 12/31/21 0524 01/01/22 0256  NA 137   < > 134* 128* 131* 131* 131*  K 3.0*   < > 3.4* 3.7 4.8 4.6 4.5  CL 95*   < > 99 95* 100 101 104  CO2 22   < > 23 20* 20* 20* 18*  GLUCOSE 82   < > 114* 144* 86 87 109*  BUN 31*   < > 34* 37* 41* 43* 46*  CREATININE 2.14*   < > 1.43* 1.34* 1.42* 1.53* 1.75*  CALCIUM 8.5*   < > 8.5* 8.3* 8.6* 8.4* 8.5*  MG 1.4*  --  1.6* 2.3  --  2.2 2.1   < > = values in this interval not displayed.   GFR Estimated Creatinine Clearance (by C-G formula based on SCr of 1.75 mg/dL (H)) Female: 36.6 mL/min (A) Female: 42.5 mL/min (A) Liver Function  Tests: Recent Labs  Lab 12/28/21 0321 12/28/21 1039 12/29/21 0310 12/30/21 0310 12/31/21 0524 12/31/21 1218 01/01/22 0256 01/01/22 0948  AST 69*  --  44* 40 43*  --  46*  --   ALT 33  --  '25 25 23  ' --  25  --   ALKPHOS 91  --  79 91 88  --  105  --   BILITOT 13.6*   < > 14.3* 14.8* 13.3* 14.1* 13.0* 10.4*  PROT 4.8*  --  4.8* 4.8* 4.4*  --  4.8*  --   ALBUMIN 1.9*  --  1.8* 1.7* 1.5*  --  1.5*  --    < > = values in this interval not displayed.   No results for input(s): "LIPASE", "AMYLASE" in the last 168 hours. Recent Labs  Lab 12/27/21 1651 12/28/21 1039 12/31/21 0932  AMMONIA 60* 58* 45*   Coagulation profile Recent Labs  Lab 12/30/21 0310 12/30/21 1032 12/31/21 0524 01/01/22 0256 01/01/22 0948  INR 1.3* 1.4* 1.6* 1.3* 1.3*    CBC: Recent Labs  Lab 12/27/21 1650 12/27/21 1751 12/30/21 0310 12/30/21 1032 12/30/21  1910 12/31/21 0524 12/31/21 1223 12/31/21 1737 01/01/22 0256 01/01/22 0948  WBC 5.5   < > 8.4  8.4  --  8.0 10.5 12.3* 11.2* 16.9*  --   NEUTROABS 4.9  --  6.7  --   --   --   --   --   --   --   HGB 5.0*   < > 8.6*  8.5*  --  8.4* 7.9* 8.2* 7.6* 8.6*  --   HCT 15.5*   < > 24.5*  24.4*  --  23.8* 22.8* 24.6* 22.4* 25.1*  --   MCV 140.9*   < > 95.7  95.7  --  94.1 97.0 98.8 97.4 97.7  --   PLT 77*   < > 22*  23*   < > 18* 19* 21* 20* 33* 26*   < > = values in this interval not displayed.   Cardiac Enzymes: Recent Labs  Lab 12/27/21 1650 12/28/21 0321  CKTOTAL 488* 257*   BNP: Invalid input(s): "POCBNP" CBG: Recent Labs  Lab 12/31/21 2355 01/01/22 0356 01/01/22 0730 01/01/22 1128 01/01/22 1712  GLUCAP 79 77 70 259* 150*   D-Dimer Recent Labs    12/30/21 1032 01/01/22 0948  DDIMER 2.70* 2.06*   Hgb A1c No results for input(s): "HGBA1C" in the last 72 hours. Lipid Profile No results for input(s): "CHOL", "HDL", "LDLCALC", "TRIG", "CHOLHDL", "LDLDIRECT" in the last 72 hours. Thyroid function studies No results for  input(s): "TSH", "T4TOTAL", "T3FREE", "THYROIDAB" in the last 72 hours.  Invalid input(s): "FREET3" Anemia work up Recent Labs    12/31/21 1218 01/01/22 0255  VITAMINB12 1,505*  --   FOLATE 17.5  --   FERRITIN 1,727*  --   TIBC NOT CALCULATED  --   IRON 39  --   RETICCTPCT 3.2* 3.4*   Microbiology Recent Results (from the past 240 hour(s))  Blood Culture (routine x 2)     Status: None   Collection Time: 12/27/21  4:14 PM   Specimen: BLOOD  Result Value Ref Range Status   Specimen Description BLOOD RIGHT ANTECUBITAL  Final   Special Requests   Final    BOTTLES DRAWN AEROBIC AND ANAEROBIC Blood Culture adequate volume   Culture   Final    NO GROWTH 5 DAYS Performed at [facility], [address] [place], [place] [address]    Report Status 01/01/2022 FINAL  Final  SARS Coronavirus 2 by RT PCR (hospital order, performed in [facility] hospital lab) *cepheid single result test* Anterior Nasal Swab     Status: None   Collection Time: 12/27/21  5:41 PM   Specimen: Anterior Nasal Swab  Result Value Ref Range Status   SARS Coronavirus 2 by RT PCR NEGATIVE NEGATIVE Final    Comment: (NOTE) SARS-CoV-2 target nucleic acids are NOT DETECTED.  The SARS-CoV-2 RNA is generally detectable in upper and lower respiratory specimens during the acute phase of infection. The lowest concentration of SARS-CoV-2 viral copies this assay can detect is 250 copies / mL. A negative result does not preclude SARS-CoV-2 infection and should not be used as the sole basis for treatment or other patient management decisions.  A negative result may occur with improper specimen collection / handling, submission of specimen other than nasopharyngeal swab, presence of viral mutation(s) within the areas targeted by this assay, and inadequate number of viral copies (<250 copies / mL). A negative result must be combined with clinical observations, patient history, and epidemiological information.  Fact  Sheet for Patients:   https://www.patel.info/  Fact Sheet for Healthcare Providers: https://hall.com/  This test is not yet approved or  cleared by the [place] FDA and has been authorized for detection and/or diagnosis of SARS-CoV-2 by FDA under an Emergency Use Authorization (EUA).  This EUA will remain in effect (meaning this test can be used) for the duration of the COVID-19 declaration under Section 564(b)(1) of the Act, 21 U.S.C. section 360bbb-3(b)(1), unless the authorization is terminated or revoked sooner.  Performed at [facility], [place] [address] [place], [place] [address]   Blood Culture (routine x 2)     Status: Abnormal   Collection Time: 12/27/21 10:24 PM   Specimen: BLOOD RIGHT HAND  Result Value Ref Range Status   Specimen Description BLOOD RIGHT HAND  Final   Special Requests   Final    BOTTLES DRAWN AEROBIC AND ANAEROBIC Blood Culture adequate volume   Culture  Setup Time   Final    GRAM POSITIVE COCCI IN CLUSTERS AEROBIC BOTTLE ONLY CRITICAL RESULT CALLED TO, READ BACK BY AND VERIFIED WITH: PHARMD C.JACKSON AT 1430 ON 12/30/2021 BY T.SAAD.    Culture (A)  Final    STAPHYLOCOCCUS WARNERI THE SIGNIFICANCE OF ISOLATING THIS ORGANISM FROM A SINGLE SET OF BLOOD CULTURES WHEN MULTIPLE SETS ARE DRAWN IS UNCERTAIN. PLEASE NOTIFY THE MICROBIOLOGY DEPARTMENT WITHIN ONE WEEK IF SPECIATION AND SENSITIVITIES ARE REQUIRED. Performed at [facility], [place] [address], [place], [place] [address]    Report Status 01/01/2022 FINAL  Final  Blood Culture ID Panel (Reflexed)     Status: Abnormal   Collection Time: 12/27/21 10:24 PM  Result Value Ref Range Status   Enterococcus faecalis NOT DETECTED NOT DETECTED Final   Enterococcus Faecium NOT DETECTED NOT DETECTED Final   Listeria monocytogenes NOT DETECTED NOT DETECTED Final   Staphylococcus species DETECTED (A) NOT DETECTED Final    Comment: CRITICAL RESULT CALLED  TO, READ BACK BY AND VERIFIED WITH: PHARMD C.JACKSON AT 1430 ON 12/30/2021 BY T.SAAD.    Staphylococcus aureus (BCID) NOT DETECTED NOT DETECTED Final   Staphylococcus epidermidis NOT DETECTED NOT DETECTED Final   Staphylococcus lugdunensis NOT DETECTED NOT DETECTED Final   Streptococcus species NOT DETECTED NOT DETECTED Final   Streptococcus agalactiae NOT DETECTED NOT DETECTED Final   Streptococcus pneumoniae NOT DETECTED NOT DETECTED Final   Streptococcus pyogenes NOT DETECTED NOT DETECTED Final   A.calcoaceticus-baumannii NOT DETECTED NOT DETECTED Final   Bacteroides fragilis NOT DETECTED NOT DETECTED Final   Enterobacterales NOT DETECTED NOT DETECTED Final   Enterobacter cloacae complex NOT DETECTED NOT DETECTED Final   Escherichia coli NOT DETECTED NOT DETECTED Final   Klebsiella aerogenes NOT DETECTED NOT DETECTED Final   Klebsiella oxytoca NOT DETECTED NOT DETECTED Final   Klebsiella pneumoniae NOT DETECTED NOT DETECTED Final   Proteus species NOT DETECTED NOT DETECTED Final   Salmonella species NOT DETECTED NOT DETECTED Final   Serratia marcescens NOT DETECTED NOT DETECTED Final   Haemophilus influenzae NOT DETECTED NOT DETECTED Final   Neisseria meningitidis NOT DETECTED NOT DETECTED Final   Pseudomonas aeruginosa NOT DETECTED NOT DETECTED Final   Stenotrophomonas maltophilia NOT DETECTED NOT DETECTED Final   Candida albicans NOT DETECTED NOT DETECTED Final   Candida auris NOT DETECTED NOT DETECTED Final   Candida glabrata NOT DETECTED NOT DETECTED Final   Candida krusei NOT DETECTED NOT DETECTED Final   Candida parapsilosis NOT DETECTED NOT DETECTED Final   Candida tropicalis NOT DETECTED  NOT DETECTED Final   Cryptococcus neoformans/gattii NOT DETECTED NOT DETECTED Final    Comment: Performed at [facility], [place] [address] [place], [place] [address]  MRSA Next Gen by PCR, Nasal     Status: None   Collection Time: 12/28/21  3:00 AM   Specimen: Nasal Mucosa; Nasal  Swab  Result Value Ref Range Status   MRSA by PCR Next Gen NOT DETECTED NOT DETECTED Final    Comment: (NOTE) The GeneXpert MRSA Assay (FDA approved for NASAL specimens only), is one component of a comprehensive MRSA colonization surveillance program. It is not intended to diagnose MRSA infection nor to guide or monitor treatment for MRSA infections. Test performance is not FDA approved in patients less than 39 years old. Performed at [facility], [place] [address], [place], [place] [address]       Studies:  DG Abd Portable 1V  Result Date: 01/01/2022 CLINICAL DATA:  Feeding tube placement. EXAM: PORTABLE ABDOMEN - 1 VIEW COMPARISON:  None Available. FINDINGS: Distal tip of feeding tube is seen in expected position of proximal stomach. IMPRESSION: Distal tip of feeding tube is seen in expected position of proximal stomach. Electronically Signed   By: Marijo Conception M.D.   On: 01/01/2022 15:02   EEG adult  Result Date: 12/31/2021 Lora Havens, MD     12/31/2021  4:20 PM Patient Name: Laura [name] MRN: [id number] Epilepsy Attending: Lora Havens Referring Physician/Provider: Gerrit Heck, MD Date: 12/31/2021 Duration: 23.57 mins Patient history: 61 year old with altered mental status.  EEG to evaluate for seizure. Level of alertness: lethargic AEDs during EEG study: None Technical aspects: This EEG study was done with scalp electrodes positioned according to the 10-20 International system of electrode placement. Electrical activity was reviewed with band pass filter of 1-'70Hz' , sensitivity of 7 uV/mm, display speed of 63m/sec with a '60Hz'  notched filter applied as appropriate. EEG data were recorded continuously and digitally stored.  Video monitoring was available and reviewed as appropriate. Description: EEG showed continuous generalized 3-'5hz'  theta-delta slowing.  Generalized periodic discharges with triphasic morphology were noted at 1 Hz.  Hyperventilation and photic stimulation  were not performed.   ABNORMALITY - Periodic discharges with triphasic morphology, generalized ( GPDs) - Continuous slow, generalized IMPRESSION: This study showed generalized periodic discharges with triphasic morphology at 1 Hz.  At times, this EEG pattern can be on the ictal pattern interictal continuum.  However given the morphology and frequency this is more commonly indicative of toxic-metabolic causes like cefepime toxicity.  Additionally there is moderate to severe diffuse encephalopathy. No definite seizures were seen during the study. [place]  [place] HEAD WO CONTRAST (5MM)  Result Date: 12/31/2021 CLINICAL DATA:  encephalopathy EXAM: CT HEAD WITHOUT CONTRAST TECHNIQUE: Contiguous axial images were obtained from the base of the skull through the vertex without intravenous contrast. RADIATION DOSE REDUCTION: This exam was performed according to the departmental dose-optimization program which includes automated exposure control, adjustment of the mA and/or kV according to patient size and/or use of iterative reconstruction technique. COMPARISON:  None Available. FINDINGS: Brain: No evidence of acute infarction, hemorrhage, hydrocephalus, extra-axial collection or mass lesion/mass effect. Partially empty sella. Cerebral atrophy. Patchy white matter hypodensities, nonspecific but compatible with microvascular ischemic disease. Vascular: No hyperdense vessel identified. Skull: No acute fracture. Sinuses/Orbits: Left sphenoid sinus mucosal thickening. No acute orbital findings. Other: No mastoid effusions. IMPRESSION: 1. No evidence of acute intracranial abnormality. 2. Partially empty sella which is often  a normal anatomic variant but can be associated with idiopathic intracranial hypertension. Electronically Signed   By: Margaretha Sheffield M.D.   On: 12/31/2021 10:51    Assessment: 61 y.o. female patient (born female but identifies as female) who presented to the hospital with altered mental status and  unresponsiveness.  Hematology was consulted to evaluate etiology of cytopenias.  Pancytopenia. Patient has anemia at baseline. Hb now almost at baseline. Thrombocytopenia appears to have been spontaneously improving, platelet count today is at 33,000.         Given spontaneous clinical improvement as well as improvement in thrombocytopenia, this is less likely related to a primary hematological process but will continue to monitor. I do feel like there is some evidence of bone marrow suppression from acute liver disease (patient denies absolutely any recent history of alcohol however appears to have significant hepatomegaly, coagulopathy, hypoalbuminemia, ascites concerning for hepatic dysfunction) At this time there is no indication for PRBC transfusion or platelet transfusion.  If he does appear to have significant worsening of cytopenias again, we are willing to do an interventional radiology guided bone marrow biopsy while inpatient for further evaluation.  I briefly discussed this with the primary team.  It appears that patient has poor prognosis hence they will consider the utility of these procedures and keep [place] posted.  When I mention the word bone marrow aspiration and biopsy while in the patient's room, patient screamed no but then could not give me a reason as to why.  He remains silent when we talked about the related of liver biopsy in that case, once again, no answer We will continue to monitor him while inpatient and provide any recommendations.   ? Acute bone marrow suppression from hospitalization and liver disease. No b12/folic acid deficiency ( pt had severe macrocytosis on initial admission, this has corrected on its own in 24 hrs, not sure if these labs were very accurate) Will discuss with GI about nature of liver disease ( ? Cirrhosis) No concern for M protein, no TP/albumin dissociation noted. In an ideal setting liver biopsy will shed some light on the nature of liver disease,  but once again, we are not quite sure if patient is willing to allow these procedures.  I have discussed this in detail with the primary team.  3. Hyperbilirubinemia, more direct than indirect. Likely from liver disease. This has improved mildly compared to yesterday.  Once again direct bilirubin predominates hence suggests liver disease. Patient may also have a low degree of hemolysis. Pt is able to maintain Hb. DAT negative, rest of the labs pending.  Overall very complex picture with severe thrombocytopenia ( is this acute illness induced?) I I will try to reach out to GI team.  I have also discussed about the plan as mentioned above with the primary team.  Overall I spent almost 50 minutes in the care of this patient including review of records, history, coordination of care  Benay Pike, MD 01/01/2022  5:20 PM

## 2022-01-01 NOTE — Progress Notes (Signed)
PT Cancellation Note  Patient Details Name: Jazell Rosenau MRN: [id number] DOB: 12/31/1960   Cancelled Treatment:    Reason Eval/Treat Not Completed: Patient declined, no reason specified.  I'm not going to do anything.  Will try back 9/15. 01/01/2022  Jacinto Halim., PT Acute Rehabilitation Services [phone]  (office)   Eliseo Gum Ayven Pheasant 01/01/2022, 3:14 PM

## 2022-01-02 LAB — COMPREHENSIVE METABOLIC PANEL
ALT: 22 U/L (ref 0–44)
AST: 44 U/L — ABNORMAL HIGH (ref 15–41)
Albumin: 2.4 g/dL — ABNORMAL LOW (ref 3.5–5.0)
Alkaline Phosphatase: 109 U/L (ref 38–126)
Anion gap: 15 (ref 5–15)
BUN: 49 mg/dL — ABNORMAL HIGH (ref 6–20)
CO2: 16 mmol/L — ABNORMAL LOW (ref 22–32)
Calcium: 8.8 mg/dL — ABNORMAL LOW (ref 8.9–10.3)
Chloride: 100 mmol/L (ref 98–111)
Creatinine, Ser: 2.01 mg/dL — ABNORMAL HIGH (ref 0.44–1.00)
GFR, Estimated: 28 mL/min — ABNORMAL LOW (ref >60)
Glucose, Bld: 186 mg/dL — ABNORMAL HIGH (ref 70–99)
Potassium: 3.9 mmol/L (ref 3.5–5.1)
Sodium: 131 mmol/L — ABNORMAL LOW (ref 135–145)
Total Bilirubin: 12 mg/dL — ABNORMAL HIGH (ref 0.3–1.2)
Total Protein: 4.9 g/dL — ABNORMAL LOW (ref 6.5–8.1)

## 2022-01-02 LAB — GLUCOSE, CAPILLARY
Glucose-Capillary: 105 mg/dL — ABNORMAL HIGH (ref 70–99)
Glucose-Capillary: 117 mg/dL — ABNORMAL HIGH (ref 70–99)
Glucose-Capillary: 119 mg/dL — ABNORMAL HIGH (ref 70–99)
Glucose-Capillary: 124 mg/dL — ABNORMAL HIGH (ref 70–99)
Glucose-Capillary: 130 mg/dL — ABNORMAL HIGH (ref 70–99)
Glucose-Capillary: 163 mg/dL — ABNORMAL HIGH (ref 70–99)

## 2022-01-02 LAB — CBC
HCT: 23 % — ABNORMAL LOW (ref 36.0–46.0)
HCT: 23.9 % — ABNORMAL LOW (ref 36.0–46.0)
Hemoglobin: 7.7 g/dL — ABNORMAL LOW (ref 12.0–15.0)
Hemoglobin: 8 g/dL — ABNORMAL LOW (ref 12.0–15.0)
MCH: 32.8 pg (ref 26.0–34.0)
MCH: 32.7 pg (ref 26.0–34.0)
MCHC: 33.5 g/dL (ref 30.0–36.0)
MCHC: 33.5 g/dL (ref 30.0–36.0)
MCV: 97.9 fL (ref 80.0–100.0)
MCV: 97.6 fL (ref 80.0–100.0)
Platelets: 37 10*3/uL — ABNORMAL LOW (ref 150–400)
Platelets: 48 10*3/uL — ABNORMAL LOW (ref 150–400)
RBC: 2.35 MIL/uL — ABNORMAL LOW (ref 3.87–5.11)
RBC: 2.45 MIL/uL — ABNORMAL LOW (ref 3.87–5.11)
WBC: 20.3 10*3/uL — ABNORMAL HIGH (ref 4.0–10.5)
WBC: 19.8 10*3/uL — ABNORMAL HIGH (ref 4.0–10.5)
nRBC: 0.2 % (ref 0.0–0.2)
nRBC: 0.3 % — ABNORMAL HIGH (ref 0.0–0.2)

## 2022-01-02 LAB — DIC (DISSEMINATED INTRAVASCULAR COAGULATION)PANEL
D-Dimer, Quant: UNDETERMINED ug/mL-FEU (ref 0.00–0.50)
D-Dimer, Quant: 2.23 ug/mL-FEU — ABNORMAL HIGH (ref 0.00–0.50)
Fibrinogen: UNDETERMINED mg/dL (ref 210–475)
Fibrinogen: 362 mg/dL (ref 210–475)
INR: UNDETERMINED (ref 0.8–1.2)
INR: 1.4 — ABNORMAL HIGH (ref 0.8–1.2)
Platelets: 27 10*3/uL — CL (ref 150–400)
Platelets: 47 10*3/uL — ABNORMAL LOW (ref 150–400)
Prothrombin Time: UNDETERMINED seconds (ref 11.4–15.2)
Prothrombin Time: 16.9 seconds — ABNORMAL HIGH (ref 11.4–15.2)
Smear Review: UNDETERMINED
Smear Review: NONE SEEN
aPTT: UNDETERMINED seconds (ref 24–36)
aPTT: 47 seconds — ABNORMAL HIGH (ref 24–36)

## 2022-01-02 LAB — BASIC METABOLIC PANEL
Anion gap: 12 (ref 5–15)
Anion gap: 15 (ref 5–15)
BUN: 53 mg/dL — ABNORMAL HIGH (ref 6–20)
BUN: 46 mg/dL — ABNORMAL HIGH (ref 6–20)
CO2: 19 mmol/L — ABNORMAL LOW (ref 22–32)
CO2: 22 mmol/L (ref 22–32)
Calcium: 8.8 mg/dL — ABNORMAL LOW (ref 8.9–10.3)
Calcium: 7.6 mg/dL — ABNORMAL LOW (ref 8.9–10.3)
Chloride: 102 mmol/L (ref 98–111)
Chloride: 96 mmol/L — ABNORMAL LOW (ref 98–111)
Creatinine, Ser: 2.13 mg/dL — ABNORMAL HIGH (ref 0.44–1.00)
Creatinine, Ser: 1.89 mg/dL — ABNORMAL HIGH (ref 0.44–1.00)
GFR, Estimated: 26 mL/min — ABNORMAL LOW (ref >60)
GFR, Estimated: 30 mL/min — ABNORMAL LOW (ref >60)
Glucose, Bld: 137 mg/dL — ABNORMAL HIGH (ref 70–99)
Glucose, Bld: 197 mg/dL — ABNORMAL HIGH (ref 70–99)
Potassium: 3.6 mmol/L (ref 3.5–5.1)
Potassium: 3 mmol/L — ABNORMAL LOW (ref 3.5–5.1)
Sodium: 133 mmol/L — ABNORMAL LOW (ref 135–145)
Sodium: 133 mmol/L — ABNORMAL LOW (ref 135–145)

## 2022-01-02 LAB — RETICULOCYTES
Immature Retic Fract: 25.9 % — ABNORMAL HIGH (ref 2.3–15.9)
RBC.: 2.31 MIL/uL — ABNORMAL LOW (ref 3.87–5.11)
Retic Count, Absolute: 79 10*3/uL (ref 19.0–186.0)
Retic Ct Pct: 3.4 % — ABNORMAL HIGH (ref 0.4–3.1)

## 2022-01-02 LAB — MAGNESIUM
Magnesium: 2 mg/dL (ref 1.7–2.4)
Magnesium: 1.5 mg/dL — ABNORMAL LOW (ref 1.7–2.4)

## 2022-01-02 LAB — C4 COMPLEMENT: Complement C4, Body Fluid: 22 mg/dL (ref 12–38)

## 2022-01-02 LAB — PHOSPHORUS
Phosphorus: 2.7 mg/dL (ref 2.5–4.6)
Phosphorus: 2.4 mg/dL — ABNORMAL LOW (ref 2.5–4.6)

## 2022-01-02 LAB — HEAVY METALS, BLOOD
Arsenic: 4 ug/L (ref 0–9)
Lead: 1.2 ug/dL (ref 0.0–3.4)
Mercury: <1 ug/L (ref 0.0–14.9)

## 2022-01-02 LAB — PROTIME-INR
INR: 1.5 — ABNORMAL HIGH (ref 0.8–1.2)
Prothrombin Time: 18.1 seconds — ABNORMAL HIGH (ref 11.4–15.2)

## 2022-01-02 LAB — ANA W/REFLEX IF POSITIVE: Anti Nuclear Antibody (ANA): NEGATIVE

## 2022-01-02 LAB — C3 COMPLEMENT: C3 Complement: 83 mg/dL (ref 82–167)

## 2022-01-02 MED ORDER — ALBUMIN HUMAN 25 % IV SOLN
25.0000 g | Freq: Four times a day (QID) | INTRAVENOUS | Status: AC
  Administered 2022-01-02 – 2022-01-03 (×3): 25 g via INTRAVENOUS
  Filled 2022-01-02 (×3): qty 100

## 2022-01-02 MED ORDER — SODIUM CHLORIDE 0.9 % IV SOLN
50.0000 ug/h | INTRAVENOUS | Status: DC
  Administered 2022-01-02 – 2022-01-13 (×23): 50 ug/h via INTRAVENOUS
  Filled 2022-01-02 (×28): qty 1

## 2022-01-02 MED ORDER — SODIUM BICARBONATE 650 MG PO TABS: 650.0000 mg | ORAL_TABLET | Freq: Three times a day (TID) | ORAL | Status: DC

## 2022-01-02 MED ORDER — THIAMINE HCL 100 MG/ML IJ SOLN
250.0000 mg | Freq: Every day | INTRAVENOUS | Status: AC
  Administered 2022-01-03 – 2022-01-04 (×2): 250 mg via INTRAVENOUS
  Filled 2022-01-02 (×2): qty 2.5

## 2022-01-02 MED ORDER — STERILE WATER FOR INJECTION IV SOLN
INTRAVENOUS | Status: DC
  Filled 2022-01-02 (×2): qty 1000

## 2022-01-02 MED ORDER — FUROSEMIDE 10 MG/ML IJ SOLN
40.0000 mg | Freq: Once | INTRAMUSCULAR | Status: AC
  Administered 2022-01-02: 40 mg via INTRAVENOUS
  Filled 2022-01-02: qty 4

## 2022-01-02 MED ORDER — METOPROLOL TARTRATE 5 MG/5ML IV SOLN: 2.5000 mg | Freq: Once | INTRAVENOUS | Status: DC

## 2022-01-02 MED ORDER — POTASSIUM CHLORIDE 10 MEQ/50ML IV SOLN
10.0000 meq | INTRAVENOUS | Status: AC
  Administered 2022-01-03 (×4): 10 meq via INTRAVENOUS
  Filled 2022-01-02 (×4): qty 50

## 2022-01-02 NOTE — Progress Notes (Signed)
eLink Physician-Brief Progress Note Patient Name: Kaedynce Tapp DOB: 08/21/1960 MRN: [id number]   Date of Service  01/02/2022  HPI/Events of Note  Hypoglycemia - K+ = 3.0 and Creatinine = 1.89.   eICU Interventions  Will replace K+.      Intervention Category Major Interventions: Electrolyte abnormality - evaluation and management  Ajit Errico Eugene 01/02/2022, 11:35 PM

## 2022-01-02 NOTE — Progress Notes (Signed)
PT Cancellation Note  Patient Details Name: Korrina Zern MRN: [id number] DOB: July 11, 1960   Cancelled Treatment:    Reason Eval/Treat Not Completed: Patient not medically ready.  RN asked to hold again today, pt also refusing everything. 01/02/2022  Jacinto Halim., PT Acute Rehabilitation Services [phone]  (office)   Eliseo Gum Taquana Bartley 01/02/2022, 4:22 PM

## 2022-01-02 NOTE — Progress Notes (Addendum)
NAME:  Laura [name], MRN:  [id number], DOB:  1961-01-08, LOS: 6 ADMISSION DATE:  12/27/2021, CONSULTATION DATE:  12/27/21 REFERRING Laura [name]:  Dr. Magdalene Patricia / EDP, CHIEF COMPLAINT:  Fall    History of Present Illness:  61 y/o patient who presented to [facility] ER on 9/9 via EMS with reports of weakness and fall.    The patient was admitted in April 2023 with symptomatic anemia, hypotension, SVT, electrolyte disturbances, UTI & conjunctivitis.  He was seen by Hematology during that admission and his anemia was felt to be related to anemia of chronic disease in the setting of liver disease. He was also seen by GI and was planned to follow up with GI as outpatient but it does not appear he was able to follow up.    Laura [name] identifies as female, lives in an apartment.  His cousin assists in his medical care at home.  He reportedly has had progressive weakness.  He attempted to go to the bathroom and fell.  He apparently was on the floor for at least a day.  EMS was activated and found the patient to be hypotensive with SBP in 60's and in SVT.  He was apparently difficult to stick for an IV. The patient reports he continues to smoke 1/2ppd, quit drinking approximately 7 months ago. Per his cousin, he has had episodes of incontinence with moisture associated rash on peri area.  In ER, the patient required O2 to maintain saturations.    Initial ER evaluation notable for recurrent episodes of SVT, hypotension.  Labs -Na 136, K 3.1, Cl 96, glucose 75, BUN 29, Cr 2.14, calcium 8.5, AG 20, albumin 2.3, AST 99 / ALT 36, ammonia 60, TSH 2.8, lactic acid 4, WBC 5.5, Hgb 5, Hct 15.5, MCV 140, platelets 77 & INR 1.5.  CXR negative. COVID negative. Blood & urine cultures pending.   PCCM called for ICU admission.    Pertinent  Medical History  Chronic Back Pain  Tobacco Abuse  ETOH Abuse - reportedly quit Feb 2023 Insomnia  Laminectomy   Significant Hospital Events: Including procedures, antibiotic start and stop dates in  addition to other pertinent events   9/9 Admit s/p mechanical fall initial Hgb 5 > 2 U pRBC-Vancomycin and Cefepime started 9/11 Hgb 6.8 > 1U pRBC; Bcx 1 NG 2 days-pulled off vancomycin 9/12 1/2 Bcx grew staphylococcus species, possible contaminant 9/13 continued thrombocytopenia, hem onc consulted. Worsening encephalopathy-EEG severe encephalopathy/cefepime toxicity, CT head no acute issues, stopped cefepime switched to CTX 9/14 Increasing WBC-repeat Bcx, restart vancomycin and continue CTX-Bcx Staphylococcus warneri 1/2 likely contaminant, Cortrak placed, Albumin x 2, MAP goal of 80-85 possible hepatorenal Interim History / Subjective:  NAEON, son came in yesterday, would like to be a part of conversations  Objective   Blood pressure 113/65, pulse 68, temperature 97.9 F (36.6 C), temperature source Axillary, resp. rate 14, height _0  (1.702 m), weight 76.2 kg, SpO2 95 %.    FiO2 (%):  [100 %] 100 %   Intake/Output Summary (Last 24 hours) at 01/02/2022 0658 Last data filed at 01/02/2022 0600 Gross per 24 hour  Intake 1380.84 ml  Output 250 ml  Net 1130.84 ml    Filed Weights   01/01/22 0331 01/01/22 0800 01/02/22 0500  Weight: 75.2 kg 75.2 kg 76.2 kg   Physical Exam: General: NAD, laying in bed, responsive to questions but still takes time to answer HENT:moist mucous membranes, normocephalic atraumatic Eyes: Eyelashes with crusting and stuck on on right inner corner  of eye unable to take completely open, some scleral icterus,  Respiratory: Clear to auscultation bilaterally, no increased work of breathing on room air Cardiovascular: RRR no m/r/g, 2+ radial pulses GI: Distended, nontender to palpation Extremities: Tender to palpation with extremity examinations, no swelling, well perfused Neuro: Responsive to questions but slow to answer, similar to yesterday  Sodium 131 from 131 K 3.9 from 4.4 Mag 2.0 Creatinine 2.01 from 1.75 AST 44 from 46 ALT 22 from 25 T. bili 12.0  from 10.4 WBC 20.3 from 15.2 Hemoglobin 7.7 from 7.1 Platelets 47 from 37 PT 16.9 from 16.5 No schistocytes on DIC panel. Fibrinogen normal D-dimer 2.23 INR  1.3 from 1.6 CBGs  MRSA swab neg Bcx grew Staphylococcus warneri 1/2 9/14, other culture NGTD New Bcx pending CBG 70 to [facility] Problem list     Assessment & Plan:  Encephalopathy 2/2 to cefepime toxicity, mild hyperammonemia, sepsis Cefepime discontinued based on EEG results and transitioned to ceftriaxone. Ammonia 45. CT head without acute issues. Treating for wernicke's as well. -High dose thiamine -off of cefepime -family discussion today  Metabolic Acidosis Worsening today -sodium bicarb @ 40 ml -repeat BMP at 3 PM  Acute hypoxemic respiratory failure  CXR with new bibasilar infiltrates--likely atelectasis. On room air currently.  -incentive spirometry  Septic shock 2/2 Urinary Source or Skin Wounds  Cannot rule out sepsis or cardiogenic shock. Low suspicion for active bleed. Sepsis could be from skin wounds or urinary incontinence or new pulmonary source.  Blood culture did grow Staphylococcus warneri in 1/2 likely contaminant -Continue ceftriaxone and vancomycin -F/u new BCx -Levophed for MAP goal of 80-85 for presumed hepatorenal syndrome -Wound care  ?Hepatorenal syndrome Given liver disease and worsening kidney function. 250 UOP. S/p 2 doses of albumin yesterday -lactulose  -consider lasix -MAP goal 80, currently on levophed  Symptomatic Anemia  Anemia of Chronic Disease  Coagulopathy  Similar admission in April 2023, felt to be related to chronic disease / liver.  INR 1.5 on admission.  Based on labs, no obvious hemolysis but continuing to monitor. Hematology following  S/p PRBC x 3  -Trend CBC for Hg goal >7 -PPI BID -thiamine, folate IV -GI signed off , believe likely secondary to liver issues -hematology oncology, can consider bone marrow biopsy pending palliative  conversations  Thrombocytopenia Starting to recover -Trend -Transfuse for goal >20 per hematology -Hematology following, appreciate recs  SVT Trop peak 35. HR has been 60s to 70s. -Telemetry -goal K+>4, Mg+ >2  -Vasovagal maneuvers PRN  AKI  Oliguria Mild Rhabdomyolysis - improving In setting of volume depletion, downtime of ~ 1 day. Worsening today, slightly more urine output  -Trend BMP / urinary output -Replace electrolytes as indicated -Avoid nephrotoxic agents, ensure adequate renal perfusion -MAP goal 80-85  Hypokalemia, Hypomagnesemia  -Trend  -Replete as needed  Hx ETOH abuse Hepatomegaly/hepatic steatosis Elevated Bilirubin  Suspect this is end stage liver disease  -Trend LFTs, INR -Thiamine og, folic acid  Conjunctivitis  -cipro eye drops course completed  Adult Failure to [place]  -Pt request full code on admission.  Feel he has poor insight into his medical care.  He states if he could not make decisions for himself, he would want Rosaland Lao [phone] to make decisions for him and his goddaughter  -We will touch base with family again -Cortrak in yesterday, feeding supplement  Skin wounds on buttocks, perineum, sacrum, POA -Wound care consult  Best Practice (right click and "  Reselect all SmartList Selections" daily)  Diet/type: Regular consistency (see orders) DVT prophylaxis: SCD GI prophylaxis: PPI Lines: N/A Foley:  N/A Code Status:  full code Last date of multidisciplinary goals of care discussion: 9/9, confirmed full code   Critical care time: 60 minutes    Laura Heck, Laura [name] FM PGY-2 [facility] 01/02/2022 6:58 AM   Please see Amion for pager number to reach on-call Pulmonary and Critical Care Team.

## 2022-01-02 NOTE — Progress Notes (Signed)
eLink Physician-Brief Progress Note Patient Name: Laura [name] DOB: Jul 19, 1960 MRN: [id number]   Date of Service  01/02/2022  HPI/Events of Note  Elevated HR - HR = 140's. Has now broken to NSR with HR = 87. BP = 132/82.  eICU Interventions  Plan: BMP and Mg++ level STAT.     Intervention Category Major Interventions: Arrhythmia - evaluation and management  Narmeen Kerper Eugene 01/02/2022, 10:21 PM

## 2022-01-02 NOTE — Progress Notes (Signed)
Progress Note  Adding octreotide, albumin and lasix for treatment of hepatorenal syndrome.   Melody Comas, MD [place] [facility] Office: [phone]   See Amion for personal pager PCCM on call pager [phone] until 7pm. Please call Elink 7p-7a. [phone]

## 2022-01-03 ENCOUNTER — Inpatient Hospital Stay (HOSPITAL_COMMUNITY): Payer: Medicare Other

## 2022-01-03 DIAGNOSIS — Z515 Encounter for palliative care: Secondary | ICD-10-CM | POA: Diagnosis not present

## 2022-01-03 DIAGNOSIS — K7011 Alcoholic hepatitis with ascites: Secondary | ICD-10-CM | POA: Diagnosis not present

## 2022-01-03 LAB — CBC
HCT: 21 % — ABNORMAL LOW (ref 36.0–46.0)
HCT: 19.6 % — ABNORMAL LOW (ref 36.0–46.0)
Hemoglobin: 7 g/dL — ABNORMAL LOW (ref 12.0–15.0)
Hemoglobin: 6.5 g/dL — CL (ref 12.0–15.0)
MCH: 32.6 pg (ref 26.0–34.0)
MCH: 33.3 pg (ref 26.0–34.0)
MCHC: 33.3 g/dL (ref 30.0–36.0)
MCHC: 33.2 g/dL (ref 30.0–36.0)
MCV: 97.7 fL (ref 80.0–100.0)
MCV: 100.5 fL — ABNORMAL HIGH (ref 80.0–100.0)
Platelets: 43 10*3/uL — ABNORMAL LOW (ref 150–400)
Platelets: 45 10*3/uL — ABNORMAL LOW (ref 150–400)
RBC: 2.15 MIL/uL — ABNORMAL LOW (ref 3.87–5.11)
RBC: 1.95 MIL/uL — ABNORMAL LOW (ref 3.87–5.11)
RDW: 29.2 % — ABNORMAL HIGH (ref 11.5–15.5)
WBC: 17.4 10*3/uL — ABNORMAL HIGH (ref 4.0–10.5)
WBC: 19.3 10*3/uL — ABNORMAL HIGH (ref 4.0–10.5)
nRBC: 0.2 % (ref 0.0–0.2)
nRBC: 0.1 % (ref 0.0–0.2)

## 2022-01-03 LAB — BASIC METABOLIC PANEL
Anion gap: 17 — ABNORMAL HIGH (ref 5–15)
BUN: 56 mg/dL — ABNORMAL HIGH (ref 6–20)
CO2: 15 mmol/L — ABNORMAL LOW (ref 22–32)
Calcium: 9.2 mg/dL (ref 8.9–10.3)
Chloride: 102 mmol/L (ref 98–111)
Creatinine, Ser: 2.59 mg/dL — ABNORMAL HIGH (ref 0.44–1.00)
GFR, Estimated: 21 mL/min — ABNORMAL LOW (ref >60)
Glucose, Bld: 117 mg/dL — ABNORMAL HIGH (ref 70–99)
Potassium: 4.3 mmol/L (ref 3.5–5.1)
Sodium: 134 mmol/L — ABNORMAL LOW (ref 135–145)

## 2022-01-03 LAB — RETICULOCYTES
Immature Retic Fract: 19 % — ABNORMAL HIGH (ref 2.3–15.9)
RBC.: 1.98 MIL/uL — ABNORMAL LOW (ref 3.87–5.11)
Retic Count, Absolute: 89.7 10*3/uL (ref 19.0–186.0)
Retic Ct Pct: 4.5 % — ABNORMAL HIGH (ref 0.4–3.1)

## 2022-01-03 LAB — COMPREHENSIVE METABOLIC PANEL
ALT: 19 U/L (ref 0–44)
AST: 32 U/L (ref 15–41)
Albumin: 2.2 g/dL — ABNORMAL LOW (ref 3.5–5.0)
Alkaline Phosphatase: 107 U/L (ref 38–126)
Anion gap: 12 (ref 5–15)
BUN: 47 mg/dL — ABNORMAL HIGH (ref 6–20)
CO2: 23 mmol/L (ref 22–32)
Calcium: 7.9 mg/dL — ABNORMAL LOW (ref 8.9–10.3)
Chloride: 97 mmol/L — ABNORMAL LOW (ref 98–111)
Creatinine, Ser: 2.07 mg/dL — ABNORMAL HIGH (ref 0.44–1.00)
GFR, Estimated: 27 mL/min — ABNORMAL LOW (ref >60)
Glucose, Bld: 219 mg/dL — ABNORMAL HIGH (ref 70–99)
Potassium: 3.1 mmol/L — ABNORMAL LOW (ref 3.5–5.1)
Sodium: 132 mmol/L — ABNORMAL LOW (ref 135–145)
Total Bilirubin: 8.9 mg/dL — ABNORMAL HIGH (ref 0.3–1.2)
Total Protein: 4.6 g/dL — ABNORMAL LOW (ref 6.5–8.1)

## 2022-01-03 LAB — DIC (DISSEMINATED INTRAVASCULAR COAGULATION)PANEL
D-Dimer, Quant: 2.74 ug/mL-FEU — ABNORMAL HIGH (ref 0.00–0.50)
Fibrinogen: 320 mg/dL (ref 210–475)
INR: 1.4 — ABNORMAL HIGH (ref 0.8–1.2)
Platelets: 42 10*3/uL — ABNORMAL LOW (ref 150–400)
Prothrombin Time: 17 seconds — ABNORMAL HIGH (ref 11.4–15.2)
Smear Review: NONE SEEN
aPTT: 50 seconds — ABNORMAL HIGH (ref 24–36)

## 2022-01-03 LAB — GLUCOSE, CAPILLARY
Glucose-Capillary: 127 mg/dL — ABNORMAL HIGH (ref 70–99)
Glucose-Capillary: 151 mg/dL — ABNORMAL HIGH (ref 70–99)
Glucose-Capillary: 166 mg/dL — ABNORMAL HIGH (ref 70–99)
Glucose-Capillary: 185 mg/dL — ABNORMAL HIGH (ref 70–99)
Glucose-Capillary: 73 mg/dL (ref 70–99)
Glucose-Capillary: 93 mg/dL (ref 70–99)

## 2022-01-03 LAB — PHOSPHORUS: Phosphorus: 1.8 mg/dL — ABNORMAL LOW (ref 2.5–4.6)

## 2022-01-03 LAB — MAGNESIUM: Magnesium: 1.7 mg/dL (ref 1.7–2.4)

## 2022-01-03 LAB — PROTIME-INR
INR: 1.4 — ABNORMAL HIGH (ref 0.8–1.2)
Prothrombin Time: 17 seconds — ABNORMAL HIGH (ref 11.4–15.2)

## 2022-01-03 LAB — PREPARE RBC (CROSSMATCH)

## 2022-01-03 MED ORDER — ORAL CARE MOUTH RINSE: 15.0000 mL | OROMUCOSAL | Status: DC | PRN

## 2022-01-03 MED ORDER — SODIUM CHLORIDE 0.9% IV SOLUTION: Freq: Once | INTRAVENOUS | Status: AC

## 2022-01-03 MED ORDER — ORAL CARE MOUTH RINSE
15.0000 mL | OROMUCOSAL | Status: DC
  Administered 2022-01-03 – 2022-01-22 (×65): 15 mL via OROMUCOSAL

## 2022-01-03 MED ORDER — ONDANSETRON HCL 4 MG/2ML IJ SOLN
4.0000 mg | Freq: Four times a day (QID) | INTRAMUSCULAR | Status: DC | PRN
  Administered 2022-01-03: 4 mg via INTRAVENOUS
  Filled 2022-01-03: qty 2

## 2022-01-03 MED ORDER — POTASSIUM PHOSPHATES 15 MMOLE/5ML IV SOLN
30.0000 mmol | Freq: Once | INTRAVENOUS | Status: AC
  Administered 2022-01-03: 30 mmol via INTRAVENOUS
  Filled 2022-01-03: qty 10

## 2022-01-03 MED ORDER — MAGNESIUM SULFATE IN D5W 1-5 GM/100ML-% IV SOLN
1.0000 g | Freq: Once | INTRAVENOUS | Status: AC
  Administered 2022-01-03: 1 g via INTRAVENOUS
  Filled 2022-01-03: qty 100

## 2022-01-03 NOTE — Progress Notes (Signed)
Pt not seen today Thrombocytopenia spontaneously improving Anemia slightly worse than baseline likely from acute illness and worsening kidney function. Please call us if you need [place] to follow patient again. Otherwise we will plan to work up anemia outpatient. Patient said No to bone marrow when I talked to him. But once again since thrombocytopenia is spontaneouly improving and since he has multiple other co-morbidites, this can wait.

## 2022-01-03 NOTE — Progress Notes (Signed)
Palliative Medicine Inpatient Follow Up Note  Family Meeting 01/03/2022   Providers: Dr. Erin Fulling and Tacey Ruiz, NP  Family Present: Partner-[name], son-[name], brothers - [name] and [place], nieces- [name] and [place], sister -Butch Penny, nephew - Antwon, and family friend - [place]  Discussion:  Introductions were made by Dr. Erin Fulling myself.  Reviewed the role of palliative care in situations such as Marcell Anger and how we are here to aid in support goals of care conversations in the setting of acute on chronic disease processes.  Dr. Erin Fulling let the conversation regarding Michael's present clinical state.  He shared that [name] is in a tough spot in the setting of his multi organ dysfunction.  He reviewed that the patient has inflammation of the liver and at the present time we are questioning whether or not the patient has end-stage liver cirrhosis.  He additionally discussed that Michael's kidneys are being affected as a result of this.  Reviewed that for further work-up later on today an ultrasound will be done if it shows significant ascites, one can say with fair confidence that the patient has cirrhosis.  If family would desire a liver biopsy could be pursued to confirm diagnosis and prognosis.  Additionally, Dr. Erin Fulling reviews that patient's cell lines are all decreased and that certainly liver and kidney disease can cause this.  He further shared the concern that perhaps there is another cause for this which the hematology team is involved.  He discussed that often to better identify this a bone marrow biopsy is pursued.  Best case and worst-case scenarios in terms of patient's outcomes physiologically as well as functionally were held.  Dr. Erin Fulling was able to share that long-term patient may require going to a long-term acute care hospital and is unclear whether or not he will be functional enough to go home.  Worst case this is an end-stage disease in time would be quite limited.  Reviewed  that [name] is not a candidate for liver transplantation due to his other organ systems which are in dysfunction as well as the fact that [name] was still drinking up until admission.  Discussed the importance of considering cardiopulmonary resuscitation status as in situation such as Marcell Anger anything can occur at any time and it is important to know in the setting of his already pronounced disease processes if we would want to put his body through additional trauma with little to no benefit.  Patient's son [name] plans to come this evening and have additional conversations after the ultrasound is done.  For now watchful waiting to see if additional improvements can be made.  SUMMARY OF RECOMMENDATIONS   Full Code/Full Scope of Care --> Family will consider this further moving forward   Continue to see what additional work-up shows --> Plan for abdominal ultrasound today to further identify significance of ascites  Dr. Erin Fulling discussed potential liver and bone marrow biopsies in the future  Best case and worst case scenarios reviewed  Family would like to allow time for outcomes  Palliative Care will continue to follow along and offer support  Time Spent: 20 ______________________________________________________________________________________ [place] Team Team Cell Phone: [phone] Please utilize secure chat with additional questions, if there is no response within 30 minutes please call the above phone number  Palliative Medicine Team providers are available by phone from 7am to 7pm daily and can be reached through the team cell phone.  Should this patient require assistance outside of these hours, please call the  patient's attending physician.     

## 2022-01-03 NOTE — Progress Notes (Signed)
NAME:  Laura [name], MRN:  [id number], DOB:  1960-12-17, LOS: 7 ADMISSION DATE:  12/27/2021, CONSULTATION DATE:  12/27/21 REFERRING MD:  Dr. Magdalene Patricia / EDP, CHIEF COMPLAINT:  Fall    History of Present Illness:  61 y/o patient who presented to [facility] ER on 9/9 via EMS with reports of weakness and fall.    The patient was admitted in April 2023 with symptomatic anemia, hypotension, SVT, electrolyte disturbances, UTI & conjunctivitis.  He was seen by Hematology during that admission and his anemia was felt to be related to anemia of chronic disease in the setting of liver disease. He was also seen by GI and was planned to follow up with GI as outpatient but it does not appear he was able to follow up.    Laura [name] identifies as female, lives in an apartment.  His cousin assists in his medical care at home.  He reportedly has had progressive weakness.  He attempted to go to the bathroom and fell.  He apparently was on the floor for at least a day.  EMS was activated and found the patient to be hypotensive with SBP in 60's and in SVT.  He was apparently difficult to stick for an IV. The patient reports he continues to smoke 1/2ppd, quit drinking approximately 7 months ago. Per his cousin, he has had episodes of incontinence with moisture associated rash on peri area.  In ER, the patient required O2 to maintain saturations.    Initial ER evaluation notable for recurrent episodes of SVT, hypotension.  Labs -Na 136, K 3.1, Cl 96, glucose 75, BUN 29, Cr 2.14, calcium 8.5, AG 20, albumin 2.3, AST 99 / ALT 36, ammonia 60, TSH 2.8, lactic acid 4, WBC 5.5, Hgb 5, Hct 15.5, MCV 140, platelets 77 & INR 1.5.  CXR negative. COVID negative. Blood & urine cultures pending.   PCCM called for ICU admission.   Pertinent  Medical History  Chronic Back Pain  Tobacco Abuse  ETOH Abuse - reportedly quit Feb 2023 Insomnia  Laminectomy   Significant Hospital Events: Including procedures, antibiotic start and stop dates in addition  to other pertinent events   9/9 Admit s/p mechanical fall initial Hgb 5 > 2 U pRBC-Vancomycin and Cefepime started 9/11 Hgb 6.8 > 1U pRBC; Bcx 1 NG 2 days-pulled off vancomycin 9/12 1/2 Bcx grew staphylococcus species, possible contaminant 9/13 continued thrombocytopenia, hem onc consulted. Worsening encephalopathy-EEG severe encephalopathy/cefepime toxicity, CT head no acute issues, stopped cefepime switched to CTX 9/14 Increasing WBC-repeat Bcx, restart vancomycin and continue CTX-Bcx Staphylococcus warneri 1/2 likely contaminant, Cortrak placed, Albumin x 2, MAP goal of 80-85 possible hepatorenal 9/15 levophed, albumin and octreotide added for hepatorenal syndrome  Interim History / Subjective:   No acute events overnight Patient is agreeable to having bone marrow biopsy and liver biopsy done.   Objective   Blood pressure 116/72, pulse 75, temperature 98.2 F (36.8 C), temperature source Axillary, resp. rate 17, height '5\' 7"'  (1.702 m), weight 76.2 kg, SpO2 96 %.        Intake/Output Summary (Last 24 hours) at 01/03/2022 0751 Last data filed at 01/03/2022 0600 Gross per 24 hour  Intake 3261.59 ml  Output 180 ml  Net 3081.59 ml   Filed Weights   01/01/22 0331 01/01/22 0800 01/02/22 0500  Weight: 75.2 kg 75.2 kg 76.2 kg   Physical Exam: General: NAD, laying in bed, responsive to questions but still takes time to answer HENT: moist mucous membranes, normocephalic atraumatic Eyes:  Respiratory: Clear to auscultation bilaterally, no increased work of breathing on room air Cardiovascular: RRR no m/r/g, 2+ radial pulses GI: Distended, nontender to palpation, BS+ Extremities: extremities tender to palpation, no swelling, well perfused Neuro: A&O x 3, slow to respond to questions.  Resolved Hospital Problem list   Acute hypoxemic respiratory failure   Assessment & Plan:   Alcohol Hepatitis Hepatomegaly Hepatic Steatosis Hyperbilirubinemia Trace Perihepatic Ascites on [place]  12/27/21 ?Hepatorenal syndrome Concern for cirrhosis is high at this time but no definitive appearance on imaging. Ascities on [place] is the strongest indicator for cirrhosis at this time - Patient agreeable to liver biopsy - Continue current treatment for possible hepatorenal syndrome with levophed for MAP goal of 80, octreotide and ascites - bilirubin level is improving - Will US abdomen today to evaluate for ascites  Acute Kidney Injury Metabolic Acidosis ?Hepatorenal syndrome Hypokalemia hypophosphatemia - stop sodium bicarb - repeat BMP this PM to monitor electrolytes - replete electrolytes PRN - UOP remains poor  Pancytopenia - hematology following - patient has agreed to have bone marrow biopsy performed - concern is for liver induced bone marrow suppression - Platelets are improving - transfuse for hemoglobin   Encephalopathy 2/2 to cefepime toxicity, mild hyperammonemia, multiorgan failure Cefepime discontinued based on EEG results and transitioned to ceftriaxone. Ammonia 45. CT head without acute issues. Treating for wernicke's -High dose thiamine -off of cefepime - mental status waxing/waning - continue lactulose  Sepses 2/2 Urinary Source or Skin Wounds   Blood culture did grow Staphylococcus warneri in 1/2 likely contaminant - 7 days of cefepime/ceftriaxone completed 9/15 - Discontinue vancomycin, MRSA screen is negative - Blood culture 9/14 negative to date -Wound care  SVT Trop peak 35. HR has been 60s to 70s. -Telemetry -goal K+>4, Mg+ >2  -Vasovagal maneuvers PRN  Conjunctivitis  -cipro eye drops course completed - PRN warm compresses  Adult Failure to Thrive  -Palliative Care Consult  -Pt request full code on admission.  Feel he has poor insight into his medical care.  He states if he could not make decisions for himself, he would want Rosaland Lao [phone] to make decisions for him and his goddaughter  -We will touch base with family again -Cortrak  in place, continue feeding supplement  Skin wounds on buttocks, perineum, sacrum, POA -Wound care consult  Best Practice (right click and "Reselect all SmartList Selections" daily)  Diet/type: Regular consistency (see orders) DVT prophylaxis: SCD GI prophylaxis: PPI Lines: N/A Foley:  N/A Code Status:  full code Last date of multidisciplinary goals of care discussion: 9/9, confirmed full code   Critical care time:  45 minutes    Freda Jackson, MD [place] Office: [phone]   See Amion for personal pager PCCM on call pager [phone] until 7pm. Please call Elink 7p-7a. [phone]

## 2022-01-03 NOTE — Progress Notes (Signed)
PCCM Update  Bedside US shows mild perihepatic ascites and air filled bowel loops.  NG tube placed to low intermittent suction. No further episodes of vomiting.  Freda Jackson, MD [place] [facility] Office: [phone]   See Amion for personal pager PCCM on call pager [phone] until 7pm. Please call Elink 7p-7a. [phone]

## 2022-01-03 NOTE — Progress Notes (Signed)
Palliative Medicine Inpatient Follow Up Note   HPI: Laura [name]") Laura [name] is a 61 y.o. adult who identifies as female with PMH of chronic back pain, alcohol abuse, anemia, HTN, and HLD who presented to [name] ED on 12/27/2021 with reports of weakness and fall. Initial labs revealed severe anemia, elevated bilirubin, and elevated ammonia. Admitted to PCCM with septic shock with unclear source. Also with symptomatic anemia, coagulopathy, SVT/hypotension, AKI, mild rhabdomyolysis, and failure to thrive.   Today's Discussion 01/03/2022  *Please note that this is a verbal dictation therefore any spelling or grammatical errors are due to the "[place] [employer]" system interpretation.  Chart reviewed inclusive of vital signs, progress notes, laboratory results, and diagnostic images.   I met at bedside with Laura [name] this morning and his significant other, Laura [name].  Upon exam Laura [name] appeared to have waxing cognitive status and that he knew who he was and where he was though not much beyond that.  I asked Laura [name] what the conversations were with his medical team this morning and he continued to perseverate on his address.  I asked Laura [name] to share with me the circumstances surrounding his admission to the [name] and he was able to tell me he fell though nothing more.  I asked Laura [name] if he understood the significance of his liver disease which she was not able to elaborate on.  I spoke at bedside to [name] and discussed patient's present clinical state.  We reviewed the importance of having a family meeting for ongoing goals of care conversations given the injury to Laura [name] organs at this time.  We reviewed that in the state of [place] surrogate decision maker would be her son, Laura [name].  [name] shares that she has a good relationship with Laura [name] as well as the patient's brother, Laura [name].  We discussed arranging a meeting to talk openly and honestly about patient's clinical state which she  was acute in agreement of.  While at bedside patient continued to say things that did not necessarily make sense and asked me to leave the room multiple times.  Per [name] he does not like discussing difficult topics.  Questions and concerns addressed/Palliative Support Provided.  _________________________________ Addendum:  I called patient's brother Laura [name] to set up a family meeting.  He shared that he and the family are coming this morning and I stressed the importance of the patient's son Laura [name] being part of this.  Laura [name] is not able to be here physically until later this evening therefore he will be called on speaker phone.  Objective Assessment: Vital Signs Vitals:   01/03/22 1100 01/03/22 1115  BP: 106/85   Pulse: 83   Resp: 19   Temp:  98 F (36.7 C)  SpO2: 97%     Intake/Output Summary (Last 24 hours) at 01/03/2022 1156 Last data filed at 01/03/2022 1000 Gross per 24 hour  Intake 2812.4 ml  Output 153 ml  Net 2659.4 ml   Last Weight  Most recent update: 01/02/2022  5:01 AM    Weight  76.2 kg (167 lb 15.9 oz)            Gen: Middle aged African-American in no acute distress HEENT: Core track in place, dry mucous membranes CV: Regular rate and rhythm PULM: On room air breathing is even and nonlabored ABD: Distended, nontender EXT: Generalized edema Neuro: Alert and oriented x2  SUMMARY OF RECOMMENDATIONS   Full code/full scope of care  Plan for family meeting this morning for further conversation regarding  patient's acute medical ailments  Ongoing palliative support  Billing based on MDM: High ______________________________________________________________________________________ [place] [facility] Team Team Cell Phone: [phone] Please utilize secure chat with additional questions, if there is no response within 30 minutes please call the above phone number  Palliative Medicine Team providers are available by  phone from 7am to 7pm daily and can be reached through the team cell phone.  Should this patient require assistance outside of these hours, please call the patient's attending physician.

## 2022-01-04 DIAGNOSIS — Z515 Encounter for palliative care: Secondary | ICD-10-CM | POA: Diagnosis not present

## 2022-01-04 DIAGNOSIS — Z7189 Other specified counseling: Secondary | ICD-10-CM | POA: Diagnosis not present

## 2022-01-04 DIAGNOSIS — K7011 Alcoholic hepatitis with ascites: Secondary | ICD-10-CM | POA: Diagnosis not present

## 2022-01-04 LAB — URINALYSIS, ROUTINE W REFLEX MICROSCOPIC
Glucose, UA: NEGATIVE mg/dL
Ketones, ur: NEGATIVE mg/dL
Nitrite: NEGATIVE
Protein, ur: 100 mg/dL — AB
Specific Gravity, Urine: 1.027 (ref 1.005–1.030)
pH: 5 (ref 5.0–8.0)

## 2022-01-04 LAB — CBC
HCT: 25.7 % — ABNORMAL LOW (ref 36.0–46.0)
HCT: 21.7 % — ABNORMAL LOW (ref 36.0–46.0)
Hemoglobin: 8.5 g/dL — ABNORMAL LOW (ref 12.0–15.0)
Hemoglobin: 7.6 g/dL — ABNORMAL LOW (ref 12.0–15.0)
MCH: 31.8 pg (ref 26.0–34.0)
MCH: 32.6 pg (ref 26.0–34.0)
MCHC: 33.1 g/dL (ref 30.0–36.0)
MCHC: 35 g/dL (ref 30.0–36.0)
MCV: 96.3 fL (ref 80.0–100.0)
MCV: 93.1 fL (ref 80.0–100.0)
Platelets: 53 10*3/uL — ABNORMAL LOW (ref 150–400)
Platelets: 55 10*3/uL — ABNORMAL LOW (ref 150–400)
RBC: 2.67 MIL/uL — ABNORMAL LOW (ref 3.87–5.11)
RBC: 2.33 MIL/uL — ABNORMAL LOW (ref 3.87–5.11)
RDW: 25.8 % — ABNORMAL HIGH (ref 11.5–15.5)
RDW: 25.9 % — ABNORMAL HIGH (ref 11.5–15.5)
WBC: 20.7 10*3/uL — ABNORMAL HIGH (ref 4.0–10.5)
WBC: 20.4 10*3/uL — ABNORMAL HIGH (ref 4.0–10.5)
nRBC: 0.1 % (ref 0.0–0.2)
nRBC: 0.1 % (ref 0.0–0.2)

## 2022-01-04 LAB — DIC (DISSEMINATED INTRAVASCULAR COAGULATION)PANEL
D-Dimer, Quant: 3.9 ug/mL-FEU — ABNORMAL HIGH (ref 0.00–0.50)
Fibrinogen: 366 mg/dL (ref 210–475)
INR: 1.4 — ABNORMAL HIGH (ref 0.8–1.2)
Platelets: 53 10*3/uL — ABNORMAL LOW (ref 150–400)
Prothrombin Time: 17 seconds — ABNORMAL HIGH (ref 11.4–15.2)
Smear Review: NONE SEEN
aPTT: 47 seconds — ABNORMAL HIGH (ref 24–36)

## 2022-01-04 LAB — BILIRUBIN, FRACTIONATED(TOT/DIR/INDIR)
Bilirubin, Direct: 5.4 mg/dL — ABNORMAL HIGH (ref 0.0–0.2)
Indirect Bilirubin: 3.6 mg/dL — ABNORMAL HIGH (ref 0.3–0.9)
Total Bilirubin: 9 mg/dL — ABNORMAL HIGH (ref 0.3–1.2)

## 2022-01-04 LAB — MAGNESIUM: Magnesium: 1.9 mg/dL (ref 1.7–2.4)

## 2022-01-04 LAB — RETICULOCYTES
Immature Retic Fract: 19.4 % — ABNORMAL HIGH (ref 2.3–15.9)
RBC.: 2.58 MIL/uL — ABNORMAL LOW (ref 3.87–5.11)
Retic Count, Absolute: 112 10*3/uL (ref 19.0–186.0)
Retic Ct Pct: 4.3 % — ABNORMAL HIGH (ref 0.4–3.1)

## 2022-01-04 LAB — GLUCOSE, CAPILLARY
Glucose-Capillary: 77 mg/dL (ref 70–99)
Glucose-Capillary: 69 mg/dL — ABNORMAL LOW (ref 70–99)
Glucose-Capillary: 63 mg/dL — ABNORMAL LOW (ref 70–99)
Glucose-Capillary: 142 mg/dL — ABNORMAL HIGH (ref 70–99)
Glucose-Capillary: 84 mg/dL (ref 70–99)
Glucose-Capillary: 87 mg/dL (ref 70–99)
Glucose-Capillary: 76 mg/dL (ref 70–99)

## 2022-01-04 LAB — PHOSPHORUS: Phosphorus: 3.5 mg/dL (ref 2.5–4.6)

## 2022-01-04 LAB — SODIUM, URINE, RANDOM: Sodium, Ur: 11 mmol/L

## 2022-01-04 MED ORDER — SODIUM BICARBONATE 650 MG PO TABS
1300.0000 mg | ORAL_TABLET | Freq: Two times a day (BID) | ORAL | Status: DC
  Administered 2022-01-04 – 2022-01-14 (×21): 1300 mg
  Filled 2022-01-04 (×22): qty 2

## 2022-01-04 MED ORDER — DEXTROSE 5 % IV SOLN: INTRAVENOUS | Status: DC

## 2022-01-04 MED ORDER — ALBUMIN HUMAN 25 % IV SOLN
25.0000 g | Freq: Four times a day (QID) | INTRAVENOUS | Status: AC
  Administered 2022-01-04 (×2): 25 g via INTRAVENOUS
  Filled 2022-01-04 (×2): qty 100

## 2022-01-04 MED ORDER — SODIUM BICARBONATE 8.4 % IV SOLN
INTRAVENOUS | Status: DC
  Filled 2022-01-04: qty 1000

## 2022-01-04 MED ORDER — DEXTROSE 50 % IV SOLN
12.5000 g | INTRAVENOUS | Status: AC
  Administered 2022-01-04: 12.5 g via INTRAVENOUS
  Filled 2022-01-04: qty 50

## 2022-01-04 NOTE — Progress Notes (Signed)
NAME:  Laura [name], MRN:  [id number], DOB:  1960/11/21, LOS: 8 ADMISSION DATE:  12/27/2021, CONSULTATION DATE:  12/27/21 REFERRING MD:  Dr. Magdalene Patricia / EDP, CHIEF COMPLAINT:  Fall    History of Present Illness:  61 y/o patient who presented to [facility] ER on 9/9 via EMS with reports of weakness and fall.    The patient was admitted in April 2023 with symptomatic anemia, hypotension, SVT, electrolyte disturbances, UTI & conjunctivitis.  He was seen by Hematology during that admission and his anemia was felt to be related to anemia of chronic disease in the setting of liver disease. He was also seen by GI and was planned to follow up with GI as outpatient but it does not appear he was able to follow up.    Laura [name] identifies as female, lives in an apartment.  His cousin assists in his medical care at home.  He reportedly has had progressive weakness.  He attempted to go to the bathroom and fell.  He apparently was on the floor for at least a day.  EMS was activated and found the patient to be hypotensive with SBP in 60's and in SVT.  He was apparently difficult to stick for an IV. The patient reports he continues to smoke 1/2ppd, quit drinking approximately 7 months ago. Per his cousin, he has had episodes of incontinence with moisture associated rash on peri area.  In ER, the patient required O2 to maintain saturations.    Initial ER evaluation notable for recurrent episodes of SVT, hypotension.  Labs -Na 136, K 3.1, Cl 96, glucose 75, BUN 29, Cr 2.14, calcium 8.5, AG 20, albumin 2.3, AST 99 / ALT 36, ammonia 60, TSH 2.8, lactic acid 4, WBC 5.5, Hgb 5, Hct 15.5, MCV 140, platelets 77 & INR 1.5.  CXR negative. COVID negative. Blood & urine cultures pending.   PCCM called for ICU admission.   Pertinent  Medical History  Chronic Back Pain  Tobacco Abuse  ETOH Abuse - reportedly quit Feb 2023 Insomnia  Laminectomy   Significant Hospital Events: Including procedures, antibiotic start and stop dates in addition  to other pertinent events   9/9 Admit s/p mechanical fall initial Hgb 5 > 2 U pRBC-Vancomycin and Cefepime started 9/11 Hgb 6.8 > 1U pRBC; Bcx 1 NG 2 days-pulled off vancomycin 9/12 1/2 Bcx grew staphylococcus species, possible contaminant 9/13 continued thrombocytopenia, hem onc consulted. Worsening encephalopathy-EEG severe encephalopathy/cefepime toxicity, CT head no acute issues, stopped cefepime switched to CTX 9/14 Increasing WBC-repeat Bcx, restart vancomycin and continue CTX-Bcx Staphylococcus warneri 1/2 likely contaminant, Cortrak placed, Albumin x 2, MAP goal of 80-85 possible hepatorenal 9/15 levophed, albumin and octreotide added for hepatorenal syndrome  Interim History / Subjective:   No acute events overnight  NG tube placed yesterday to low intermittent suction for nausea and vomiting  Decreased urine output and rising Cr. Nephrology consulted.  Family meeting yesterday with palliative care.  Serum bicarb declining. Glucose has been in the 70s with tube feeds off.   Objective   Blood pressure 115/77, pulse 92, temperature 98.1 F (36.7 C), temperature source Oral, resp. rate 18, height 5' 7" (1.702 m), weight 76.2 kg, SpO2 96 %.    FiO2 (%):  [97 %-98 %] 98 %   Intake/Output Summary (Last 24 hours) at 01/04/2022 0815 Last data filed at 01/04/2022 0700 Gross per 24 hour  Intake 2296.45 ml  Output 1109 ml  Net 1187.45 ml   Filed Weights   01/01/22 0331 01/01/22  0800 01/02/22 0500  Weight: 75.2 kg 75.2 kg 76.2 kg   Physical Exam: General: NAD, laying in bed, responsive to questions but confused HENT: moist mucous membranes, normocephalic atraumatic Eyes: sclera icteric Respiratory: Clear to auscultation bilaterally, no increased work of breathing on room air Cardiovascular: RRR no m/r/g, 2+ radial pulses GI: Distended, nontender to palpation, BS+ Extremities: warm, 1+ edema Neuro: slow to respond to questions, not oriented  Resolved Hospital Problem list    Acute hypoxemic respiratory failure  Conjunctivitis   Assessment & Plan:   Alcohol Hepatitis Hepatomegaly Hepatic Steatosis Hyperbilirubinemia Trace Perihepatic Ascites on [place] 12/27/21 ?Hepatorenal syndrome Concern for cirrhosis is high at this time but no definitive appearance on imaging. Ascities on [place] is the strongest indicator for cirrhosis at this time - Will consider liver biopsy - Continue current treatment for possible hepatorenal syndrome with levophed for MAP goal of 80 and octreotide  - bilirubin level stable around 9 - Minimal ascites on bedside US 9/16 - Repeat ANA and anti-mitochondrial labs today  Acute Kidney Injury Metabolic Acidosis ?Hepatorenal syndrome Hypokalemia hypophosphatemia - resume sodium bicarb - replete electrolytes PRN - UOP remains poor - Nephrology consulted today for further evaluation and recommendations  Pancytopenia - hematology following - Will consider bone marrow biopsy - concern is for liver induced bone marrow suppression - Platelets are improving - transfuse for hemoglobin <7g/dL, received 1 unit PRBCs yesterday  Encephalopathy 2/2 to cefepime toxicity, mild hyperammonemia, multiorgan failure Cefepime discontinued based on EEG results and transitioned to ceftriaxone. Ammonia 45. CT head without acute issues. Treating for wernicke's -High dose thiamine - off of cefepime - mental status waxing/waning - continue lactulose  Ileus diarrhea - nausea and vomiting developed 9/16, NG tube placed to low intermittent suction - diarrhea BM this morning  Sepsis 2/2 Urinary Source or Skin Wounds   Blood culture did grow Staphylococcus warneri in 1/2 likely contaminant - 7 days of cefepime/ceftriaxone completed 9/15 - Discontinue vancomycin, MRSA screen is negative - Blood culture 9/14 negative to date - Wound care  SVT Trop peak 35. HR has been 60s to 70s. -Telemetry -goal K+>4, Mg+ >2  -Vasovagal maneuvers PRN  Adult Failure to  Thrive  -Palliative Care Consult  -Pt request full code on admission.  Feel he has poor insight into his medical care.  He states if he could not make decisions for himself, he would want Rosaland Lao [phone] to make decisions for him and his goddaughter  -Family meeting held 9/16. Discussed need for review of code status in coming days. Family now has better understanding of the critical condition patient is in. - holding tube feeds due to ileus at this time  Skin wounds on buttocks, perineum, sacrum, POA -Wound care consult  Best Practice (right click and "Reselect all SmartList Selections" daily)  Diet/type: Regular consistency (see orders) DVT prophylaxis: SCD GI prophylaxis: PPI Lines: N/A Foley:  N/A Code Status:  full code Last date of multidisciplinary goals of care discussion: 9/9, confirmed full code   Critical care time:  35 minutes    Freda Jackson, MD [place] Office: [phone]   See Amion for personal pager PCCM on call pager [phone] until 7pm. Please call Elink 7p-7a. [phone]

## 2022-01-04 NOTE — Progress Notes (Signed)
   Palliative Medicine Inpatient Follow Up Note   HPI: Laura [name]") Laura [name] is a 61 y.o. adult who identifies as female with PMH of chronic back pain, alcohol abuse, anemia, HTN, and HLD who presented to [facility] ED on 12/27/2021 with reports of weakness and fall. Initial labs revealed severe anemia, elevated bilirubin, and elevated ammonia. Admitted to PCCM with septic shock with unclear source. Also with symptomatic anemia, coagulopathy, SVT/hypotension, AKI, mild rhabdomyolysis, and failure to thrive.   Today's Discussion 01/04/2022  *Please note that this is a verbal dictation therefore any spelling or grammatical errors are due to the "[place] [employer]" system interpretation.  Chart reviewed inclusive of vital signs, progress notes, laboratory results, and diagnostic images.   I met at bedside with Laura [name] this afternoon - no family was present. He is more clear minded and able to share with me that his conversation with the nephrologist.We reviewed that possibly if present efforts do not support his urinating that we may need to consider dialysis in the near future.    We reviewed that he is in a very tenuous situation with his multi-system organ dysfunction.   I shared that we may be faced moving forward with some difficult decisions as they relate to the care direction.   I provided a brief summary of the meeting from yesterday's dialogue. Although more clear - he was not able to follow this in it's entirety.   We reviewed the plan to continue present care for the time being.   Laura [name] as of now wants full scope of care and continued efforts made to improve his health status.   Objective Assessment: Vital Signs Vitals:   01/04/22 0800 01/04/22 0815  BP:  (!) 126/91  Pulse: 89 82  Resp:  (!) 22  Temp:  98 F (36.7 C)  SpO2: 94% 94%    Intake/Output Summary (Last 24 hours) at 01/04/2022 1406 Last data filed at 01/04/2022 1200 Gross per 24 hour  Intake 1566.52 ml  Output 1131 ml   Net 435.52 ml    Last Weight  Most recent update: 01/02/2022  5:01 AM    Weight  76.2 kg (167 lb 15.9 oz)            Gen: Middle aged African-American in no acute distress HEENT: Core track in place, dry mucous membranes CV: Regular rate and rhythm PULM: On room air breathing is even and nonlabored ABD: Distended, nontender EXT: Generalized edema Neuro: Alert and oriented x2  SUMMARY OF RECOMMENDATIONS   Full code/full scope of care  Patient would like at this time all efforts made to improve his health  Ongoing palliative support - I will not be present tomorrow though one of my colleagues will follow up with Laura [name]  Billing based on MDM: High ______________________________________________________________________________________ [place] Team Team Cell Phone: [phone] Please utilize secure chat with additional questions, if there is no response within 30 minutes please call the above phone number  Palliative Medicine Team providers are available by phone from 7am to 7pm daily and can be reached through the team cell phone.  Should this patient require assistance outside of these hours, please call the patient's attending physician.
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
## 2022-01-05 DIAGNOSIS — N179 Acute kidney failure, unspecified: Secondary | ICD-10-CM | POA: Diagnosis not present

## 2022-01-05 DIAGNOSIS — K7011 Alcoholic hepatitis with ascites: Secondary | ICD-10-CM | POA: Diagnosis not present

## 2022-01-05 DIAGNOSIS — D696 Thrombocytopenia, unspecified: Secondary | ICD-10-CM | POA: Diagnosis not present

## 2022-01-05 LAB — CBC
HCT: 21.6 % — ABNORMAL LOW (ref 36.0–46.0)
Hemoglobin: 7.6 g/dL — ABNORMAL LOW (ref 12.0–15.0)
MCH: 33.2 pg (ref 26.0–34.0)
MCHC: 35.2 g/dL (ref 30.0–36.0)
MCV: 94.3 fL (ref 80.0–100.0)
Platelets: 54 10*3/uL — ABNORMAL LOW (ref 150–400)
RBC: 2.29 MIL/uL — ABNORMAL LOW (ref 3.87–5.11)
RDW: 26.7 % — ABNORMAL HIGH (ref 11.5–15.5)
WBC: 26.4 10*3/uL — ABNORMAL HIGH (ref 4.0–10.5)
nRBC: 0.1 % (ref 0.0–0.2)

## 2022-01-05 LAB — CBC WITH DIFFERENTIAL/PLATELET
Abs Immature Granulocytes: 1.3 10*3/uL — ABNORMAL HIGH (ref 0.00–0.07)
Basophils Absolute: 0.1 10*3/uL (ref 0.0–0.1)
Basophils Relative: 0 %
Eosinophils Absolute: 0.1 10*3/uL (ref 0.0–0.5)
Eosinophils Relative: 0 %
HCT: 21.7 % — ABNORMAL LOW (ref 36.0–46.0)
Hemoglobin: 7.5 g/dL — ABNORMAL LOW (ref 12.0–15.0)
Immature Granulocytes: 5 %
Lymphocytes Relative: 3 %
Lymphs Abs: 0.7 10*3/uL (ref 0.7–4.0)
MCH: 32.9 pg (ref 26.0–34.0)
MCHC: 34.6 g/dL (ref 30.0–36.0)
MCV: 95.2 fL (ref 80.0–100.0)
Monocytes Absolute: 0.9 10*3/uL (ref 0.1–1.0)
Monocytes Relative: 3 %
Neutro Abs: 23.8 10*3/uL — ABNORMAL HIGH (ref 1.7–7.7)
Neutrophils Relative %: 89 %
Platelets: 54 10*3/uL — ABNORMAL LOW (ref 150–400)
RBC: 2.28 MIL/uL — ABNORMAL LOW (ref 3.87–5.11)
RDW: 26.5 % — ABNORMAL HIGH (ref 11.5–15.5)
WBC: 26.9 10*3/uL — ABNORMAL HIGH (ref 4.0–10.5)
nRBC: 0.1 % (ref 0.0–0.2)

## 2022-01-05 LAB — PROTEIN ELECTROPHORESIS, SERUM
A/G Ratio: 0.9 (ref 0.7–1.7)
Albumin ELP: 2 g/dL — ABNORMAL LOW (ref 2.9–4.4)
Alpha-1-Globulin: 0.3 g/dL (ref 0.0–0.4)
Alpha-2-Globulin: 0.4 g/dL (ref 0.4–1.0)
Beta Globulin: 0.7 g/dL (ref 0.7–1.3)
Gamma Globulin: 0.9 g/dL (ref 0.4–1.8)
Globulin, Total: 2.3 g/dL (ref 2.2–3.9)
Total Protein ELP: 4.3 g/dL — ABNORMAL LOW (ref 6.0–8.5)

## 2022-01-05 LAB — TYPE AND SCREEN
ABO/RH(D): O POS
Antibody Screen: NEGATIVE
Unit division: 0

## 2022-01-05 LAB — RETICULOCYTES
Immature Retic Fract: 28 % — ABNORMAL HIGH (ref 2.3–15.9)
RBC.: 2.26 MIL/uL — ABNORMAL LOW (ref 3.87–5.11)
Retic Count, Absolute: 85 10*3/uL (ref 19.0–186.0)
Retic Ct Pct: 3.8 % — ABNORMAL HIGH (ref 0.4–3.1)

## 2022-01-05 LAB — BPAM RBC
Blood Product Expiration Date: 202309232359
ISSUE DATE / TIME: 202309162034
Unit Type and Rh: 5100

## 2022-01-05 LAB — DIC (DISSEMINATED INTRAVASCULAR COAGULATION)PANEL
D-Dimer, Quant: 4.37 ug/mL-FEU — ABNORMAL HIGH (ref 0.00–0.50)
Fibrinogen: 309 mg/dL (ref 210–475)
INR: 1.5 — ABNORMAL HIGH (ref 0.8–1.2)
Platelets: 56 10*3/uL — ABNORMAL LOW (ref 150–400)
Prothrombin Time: 17.7 seconds — ABNORMAL HIGH (ref 11.4–15.2)
Smear Review: NONE SEEN
aPTT: 46 seconds — ABNORMAL HIGH (ref 24–36)

## 2022-01-05 LAB — RENAL FUNCTION PANEL
Albumin: 2.4 g/dL — ABNORMAL LOW (ref 3.5–5.0)
Anion gap: 12 (ref 5–15)
BUN: 64 mg/dL — ABNORMAL HIGH (ref 6–20)
CO2: 17 mmol/L — ABNORMAL LOW (ref 22–32)
Calcium: 8.8 mg/dL — ABNORMAL LOW (ref 8.9–10.3)
Chloride: 104 mmol/L (ref 98–111)
Creatinine, Ser: 2.99 mg/dL — ABNORMAL HIGH (ref 0.44–1.00)
GFR, Estimated: 17 mL/min — ABNORMAL LOW (ref >60)
Glucose, Bld: 99 mg/dL (ref 70–99)
Phosphorus: 3.7 mg/dL (ref 2.5–4.6)
Potassium: 4 mmol/L (ref 3.5–5.1)
Sodium: 133 mmol/L — ABNORMAL LOW (ref 135–145)

## 2022-01-05 LAB — GLUCOSE, CAPILLARY
Glucose-Capillary: 103 mg/dL — ABNORMAL HIGH (ref 70–99)
Glucose-Capillary: 115 mg/dL — ABNORMAL HIGH (ref 70–99)
Glucose-Capillary: 123 mg/dL — ABNORMAL HIGH (ref 70–99)
Glucose-Capillary: 75 mg/dL (ref 70–99)
Glucose-Capillary: 78 mg/dL (ref 70–99)
Glucose-Capillary: 94 mg/dL (ref 70–99)

## 2022-01-05 LAB — MAGNESIUM: Magnesium: 1.9 mg/dL (ref 1.7–2.4)

## 2022-01-05 LAB — BILIRUBIN, FRACTIONATED(TOT/DIR/INDIR)
Bilirubin, Direct: 4.6 mg/dL — ABNORMAL HIGH (ref 0.0–0.2)
Indirect Bilirubin: 3.2 mg/dL — ABNORMAL HIGH (ref 0.3–0.9)
Total Bilirubin: 7.8 mg/dL — ABNORMAL HIGH (ref 0.3–1.2)

## 2022-01-05 LAB — MITOCHONDRIAL ANTIBODIES: Mitochondrial M2 Ab, IgG: <20 Units (ref 0.0–20.0)

## 2022-01-05 MED ORDER — OSMOLITE 1.5 CAL PO LIQD
1000.0000 mL | ORAL | Status: DC
  Administered 2022-01-05 – 2022-01-06 (×2): 1000 mL
  Filled 2022-01-05: qty 1000

## 2022-01-05 MED ORDER — FENTANYL CITRATE PF 50 MCG/ML IJ SOSY: 25.0000 ug | PREFILLED_SYRINGE | INTRAMUSCULAR | Status: DC | PRN

## 2022-01-05 MED ORDER — PANTOPRAZOLE 2 MG/ML SUSPENSION
40.0000 mg | Freq: Every day | ORAL | Status: DC
  Administered 2022-01-06 – 2022-01-14 (×9): 40 mg
  Filled 2022-01-05 (×10): qty 20

## 2022-01-05 MED ORDER — THIAMINE MONONITRATE 100 MG PO TABS
100.0000 mg | ORAL_TABLET | Freq: Every day | ORAL | Status: DC
  Administered 2022-01-05 – 2022-01-14 (×10): 100 mg
  Filled 2022-01-05 (×10): qty 1

## 2022-01-05 MED ORDER — MAGNESIUM SULFATE 2 GM/50ML IV SOLN
2.0000 g | Freq: Once | INTRAVENOUS | Status: AC
  Administered 2022-01-05: 2 g via INTRAVENOUS
  Filled 2022-01-05: qty 50

## 2022-01-05 MED ORDER — RIFAXIMIN 550 MG PO TABS
550.0000 mg | ORAL_TABLET | Freq: Three times a day (TID) | ORAL | Status: DC
  Administered 2022-01-05 – 2022-01-14 (×28): 550 mg
  Filled 2022-01-05 (×29): qty 1

## 2022-01-05 NOTE — Progress Notes (Signed)
Given spontaneous improvement in cytopenias, we will sign off. Please send an outpatient consult at the time of discharge for follow up.

## 2022-01-05 NOTE — Progress Notes (Addendum)
NAME:  Laura [name], MRN:  [id number], DOB:  [birth year], LOS: 9 ADMISSION DATE:  12/27/2021, CONSULTATION DATE:  12/27/21 REFERRING MD:  Dr. Myrlene Broker / EDP, CHIEF COMPLAINT:  Fall    History of Present Illness:  61 y/o patient who presented to [facility] ER on 9/9 via EMS with reports of weakness and fall.    The patient was admitted in April 2023 with symptomatic anemia, hypotension, SVT, electrolyte disturbances, UTI & conjunctivitis.  He was seen by Hematology during that admission and his anemia was felt to be related to anemia of chronic disease in the setting of liver disease. He was also seen by GI and was planned to follow up with GI as outpatient but it does not appear he was able to follow up.    Laura [name] identifies as female, lives in an apartment.  His cousin assists in his medical care at home.  He reportedly has had progressive weakness.  He attempted to go to the bathroom and fell.  He apparently was on the floor for at least a day.  EMS was activated and found the patient to be hypotensive with SBP in 60's and in SVT.  He was apparently difficult to stick for an IV. The patient reports he continues to smoke 1/2ppd, quit drinking approximately 7 months ago. Per his cousin, he has had episodes of incontinence with moisture associated rash on peri area.  In ER, the patient required O2 to maintain saturations.    Initial ER evaluation notable for recurrent episodes of SVT, hypotension.  Labs -Na 136, K 3.1, Cl 96, glucose 75, BUN 29, Cr 2.14, calcium 8.5, AG 20, albumin 2.3, AST 99 / ALT 36, ammonia 60, TSH 2.8, lactic acid 4, WBC 5.5, Hgb 5, Hct 15.5, MCV 140, platelets 77 & INR 1.5.  CXR negative. COVID negative. Blood & urine cultures pending.   PCCM called for ICU admission.   Pertinent  Medical History  Chronic Back Pain  Tobacco Abuse  ETOH Abuse - reportedly quit Feb 2023, but reports of active ETOH use prior to admission Insomnia  Laminectomy   Significant Hospital Events: Including  procedures, antibiotic start and stop dates in addition to other pertinent events   9/9 Admit s/p mechanical fall initial Hgb 5 > 2 U pRBC-Vancomycin and Cefepime started 9/11 Hgb 6.8 > 1U pRBC; Bcx 1 NG 2 days-pulled off vancomycin 9/12 1/2 Bcx grew staphylococcus species, possible contaminant 9/13 continued thrombocytopenia, hem onc consulted. Worsening encephalopathy-EEG severe encephalopathy/cefepime toxicity, CT head no acute issues, stopped cefepime switched to CTX 9/14 Increasing WBC-repeat Bcx, restart vancomycin and continue CTX-Bcx Staphylococcus warneri 1/2 likely contaminant, Cortrak placed, Albumin x 2, MAP goal of 80-85 possible hepatorenal 9/15 levophed, albumin and octreotide added for ?hepatorenal syndrome 9/16 NGT placed for N/V 9/17 Nephrology consult  Interim History / Subjective:  No events overnight, reported 90 ml UOP overnight HR up transiently this morning for BM/ clean up Remains on NE 1 mcg and octreotide  NGT tube clamped overnight.  No further N/V.   Objective   Blood pressure 136/71, pulse 91, temperature 98.4 F (36.9 C), temperature source Oral, resp. rate 19, height 5\' 7"  (1.702 m), weight 78.6 kg, SpO2 95 %.        Intake/Output Summary (Last 24 hours) at 01/05/2022 0718 Last data filed at 01/05/2022 0600 Gross per 24 hour  Intake 1615.69 ml  Output 70 ml  Net 1545.69 ml   Filed Weights   01/01/22 0800 01/02/22 0500 01/05/22 0500  Weight: 75.2 kg 76.2 kg 78.6 kg   Physical Exam: General:  chronically ill appearing, NAD lying in bed HEENT: MM pale/moist, pupils 4/reactive, scleral icterus  Neuro: opens eyes to conversation, oriented to person and place but no year/ month. MAE-generally weak, follow simple commands but remains confused> waxes/ wanes CV: rr, ST, no murmur PULM:  non labored, slight exp wheeze otherwise clear and diminished GI:  mild distention, soft/ NT, +bs, foley, +bm Extremities: warm/dry, +1 LE edema   Afebrile  Wt 76.2>  78.6Kg Net +16.6L No UOP last 12 hrs  3 BM's yesterday, one so far today  9/14 BC> ngtd 3  Labs> Na 133, K 4, BUN 56> 64, sCr 2.59> 2.99, Mag 1.9, improving bili, t.bili 9> 7.8, WBC 20.4> 26.4, plts 55> 56, INR 1.5, no schistocytes, normal fibrinogen  Resolved Hospital Problem list   Acute hypoxemic respiratory failure  Conjunctivitis  Hypokalemia Hypophosphatemia  Assessment & Plan:   Alcohol Hepatitis Hepatomegaly Hepatic Steatosis Hyperbilirubinemia Trace Perihepatic Ascites on [place] 12/27/21 ?Hepatorenal syndrome Concern for cirrhosis is high at this time but no definitive appearance on imaging. Ascities on [place] is the strongest indicator for cirrhosis at this time - Minimal ascites on bedside US 9/16 - Reached out to [place] again for evaluation of liver biopsy for definitive diagnosis.  - Continue current treatment for possible hepatorenal syndrome with levophed for MAP goal of 80 and octreotide.  Restarting midodrine per TF, likely can come off NE today.  S/p albumin 9/17 x2 - bilirubin level down 9> 7.8 - restart lactulose  - Repeat ANA and anti-mitochondrial labs pending  Acute Kidney Injury, oliguric  Metabolic Acidosis ?Hepatorenal syndrome - appreciate Nephrology assistance - UOP remains low, near anuric - BUN/ sCr up today  56/ 2.59> 64/ 2.99 - no urgent HD needs - resume sodium bicarb per cortrak - replete electrolytes PRN  Pancytopenia - hematology  s/o 9/16 as thrombocytopenia spont improving and anemia worse than baseline but stable with AKI.  Plans for outpt anemia workup.  Some concern is for liver induced bone marrow suppression, however patient refused biopsy with hematology.   - DIC panel remains neg.   - H/H stable, continue to monitor  - transfuse for hemoglobin <7g/dL  Encephalopathy 2/2 to cefepime toxicity, mild hyperammonemia, multiorgan failure Cefepime discontinued based on EEG results and transitioned to ceftriaxone 9/13. CT head without acute  issues. Treating for wernicke's - confusion seems to wax/ wane, continue to monitor closely - cont high dose thiamine - continue lactulose - will d/c morphine and change to fentanyl prn pain given renal function   Ileus diarrhea - nausea and vomiting developed 9/16, NG tube placed to low intermittent suction.  Clamped overnight 9/17.  No further N/V, abd soft/ NT - restart enteral meds and trickle feeds, monitor closely  - aspiration precautions  Sepsis 2/2 Urinary Source or Skin Wounds   Blood culture did grow Staphylococcus warneri in 1/2 likely contaminant - 7 days of cefepime/ceftriaxone completed 9/15 - Blood culture 9/14 negative to date - WBC trending up sine 9/15 but remains afebrile.  Continue to clinically monitor.  - Wound care  SVT - cont telemetry monitoring.  HR up into 130-140s this morning with BM/ clean up but spont resolved back to 80's - goal K+>4, Mg+ >2, will give Mag 2gm today - Vasovagal maneuvers PRN  Adult Failure to Thrive  -Appreciate assistance from Palliative Care  -Pt request full code on admission.  Feel he has poor insight into  his medical care.  He states if he could not make decisions for himself, he would want [name] [phone] to make decisions for him and his goddaughter  -Family meeting held 9/16. Discussed need for review of code status in coming days. Family now has better understanding of the critical condition patient is in. - as of 9/17, patient still wants full aggressive medical care  Skin wounds on buttocks, perineum, sacrum, POA - per WOC  Best Practice (right click and "Reselect all SmartList Selections" daily)  Diet/type: tubefeeds-per cortrak DVT prophylaxis: SCD GI prophylaxis: PPI Lines: N/A Foley:  N/A Code Status:  full code Last date of multidisciplinary goals of care discussion: 9/9, 9/17  No SO/ family at bedside.  Pending update 9/18.  Addendum, 1136, son, Cristal Deer updated by phone.   Critical care time:   38 minutes    Posey Boyer, MSN, AG-ACNP-BC [facility] 01/05/2022, 7:18 AM  See Amion for pager If no response to pager, please call PCCM consult pager After 7:00 pm call Elink

## 2022-01-05 NOTE — Progress Notes (Addendum)
Per order of Levophed, MAP(80-85) reached and levo paused.  Current MAP is  98 and previous was 116

## 2022-01-05 NOTE — Progress Notes (Signed)
Daily Rounding Note  01/05/2022, 12:22 PM  LOS: 9 days   SUBJECTIVE:   Chief complaint:      CCM requests GI input re pursuing liver biopsy.   Last assay of LFTs was 2 days ago when elevated AST had normalized (99... 32), ALT consistently wnl, alkaline phosphatase consistently wnl, T. bili improved from 14.6... 8.9. Albumin remains significantly depleted at 1.5. ANA negative.  Mitochondrial antibodies in process.  In April she had elevated mitochondrial M2 IgG of 20.6 (normal is 0-20).  Had elevated IgM of 223 ( normal 26-217), IgA 523 (normal 87-352).  IgG normal.  Ammonia 45 INR 1.5.   07/30/2021 MRCP/MRI showed severe hepatomegaly and severe hepatic steatosis, trace ascites, no evidence for biliary tract obstruction or cirrhosis.  Known history severe hepatic steatosis, hepatomegaly.  Hepatomegaly seen on MRCP 07/2021. Transfusion requiring anemia in March Diarrhea with unremarkable colonoscopy, random biopsies unremarkable March.  EGD with normal appearance.  Duodenal biopsies showed fragments of partial blunting of villi but no intraepithelial lymphocytes, pathology not diagnostic for celiac disease.  Seen a little over a week ago and followed for a few days for acute on chronic anemia, FOBT positive but no overt GI bleeding.  GI did not pursue EGD/colonoscopy.  She was ruled in for mild rhabdo, AKI, Alcoholic hepatitis.  12/27/21 sbf ultrasound w Dopplers limited but patent antegrade portal and hepatic vein flow,  hepatomegaly, hepatic steatosis, uncomplicated cholelithiasis, no definitive cirrhosis.  MELD sodium 27.  She has had pancytopenia with profound thrombocytopenia, there is no splenomegaly on imaging.  Persistent hyponatremia.  Septic shock.  Patient had an episode of emesis yesterday so her tube feeds were held.  No nausea today and tube feeds reinitiated this morning at decreased rate.  Last bedside swallow evaluation  was 9/11 at which point her mental status was fluctuating, she was not following commands.  She had no overt aspiration on pures, thin liquids.  Speech-language felt that she could probably advance diet to regular consistency, thin liquids but this has not happened yet. .  OBJECTIVE:         Vital signs in last 24 hours:    Temp:  [97.6 F (36.4 C)-98.5 F (36.9 C)] 98.1 F (36.7 C) (09/18 1100) Pulse Rate:  [67-130] 79 (09/18 1200) Resp:  [11-23] 19 (09/18 1200) BP: (92-144)/(53-92) 123/92 (09/18 1200) SpO2:  [94 %-99 %] 96 % (09/18 1200) Weight:  [78.6 kg] 78.6 kg (09/18 0500) Last BM Date : 01/05/22 Filed Weights   01/01/22 0800 01/02/22 0500 01/05/22 0500  Weight: 75.2 kg 76.2 kg 78.6 kg   General: Looks severely ill both acutely and chronically.  A bit agitated/restless. Heart: RRR. Chest: No labored breathing or cough Abdomen: Soft without tenderness.  Obese.  Bowel sounds active.  Hepatomegaly with dullness to percussion a few inches below the costal margin. Extremities: Slight to moderate pedal edema. Neuro/Psych: Confused.  Says he wants to go home.  Able to tell me he is at the hospital but not the year or pertinent details to recent events.  Follows commands.  No asterixis.  Intake/Output from previous day: 09/17 0701 - 09/18 0700 In: 1615.7 [I.V.:1253.4; IV Piggyback:362.3] Out: 70 [Urine:70]  Intake/Output this shift: Total I/O In: 408.9 [I.V.:332.1; NG/GT:26.8; IV Piggyback:50.1] Out: 25 [Urine:25]  Lab Results: Recent Labs    01/04/22 0430 01/04/22 1749 01/05/22 0507  WBC 20.7* 20.4* 26.9*  26.4*  HGB 8.5* 7.6* 7.5*  7.6*  HCT  25.7* 21.7* 21.7*  21.6*  PLT 53*  53* 55* 54*  56*  54*   BMET Recent Labs    01/03/22 0440 01/03/22 1745 01/05/22 0507  NA 132* 134* 133*  K 3.1* 4.3 4.0  CL 97* 102 104  CO2 23 15* 17*  GLUCOSE 219* 117* 99  BUN 47* 56* 64*  CREATININE 2.07* 2.59* 2.99*  CALCIUM 7.9* 9.2 8.8*   LFT Recent Labs     01/03/22 0440 01/04/22 0430 01/05/22 0507  PROT 4.6*  --   --   ALBUMIN 2.2*  --  2.4*  AST 32  --   --   ALT 19  --   --   ALKPHOS 107  --   --   BILITOT 8.9* 9.0* 7.8*  BILIDIR  --  5.4* 4.6*  IBILI  --  3.6* 3.2*   PT/INR Recent Labs    01/04/22 0430 01/05/22 0507  LABPROT 17.0* 17.7*  INR 1.4* 1.5*   Hepatitis Panel No results for input(s): "HEPBSAG", "HCVAB", "HEPAIGM", "HEPBIGM" in the last 72 hours.  Studies/Results: No results found.     ASSESMENT:   Alcoholic hepatitis.  Hepatomegaly, hepatic steatosis but no definitive diagnosis of cirrhosis.  CCM wondering about liver biopsy to further delineate. MELD-NA as of 9/16 labs: 19.6.      Pancytopenia.   [place] anemia.  FOBT + but no overt melena, hematochezia, BPR, CGE etc.  Longstanding issue.  Hematology signed off.  Plan outpt fup.      Thrombocytopenia.  Overall improved 42 k... 54 k.    Coagulopathy.  INR 1.5.  No evidence for DIC.  Hyponatremia.     AKI.   Rhabdo.  Renal is following.  Differential includes ATN vs HRS.  Urine sodium 11. oliguric.  On Levophed, midodrine, octreotide, albumin.    Septic shock.       AMS.  Ongoing confusion and agitation.    PLAN     Dr Orvan Falconer will see pt and review regarding proceeding with liver biopsy.    Jennye Moccasin  01/05/2022, 12:22 PM Phone [phone]

## 2022-01-05 NOTE — Progress Notes (Signed)
Subjective:  fairly stable-  palliative discussion noted-  wanting full scope of care but not sure what that means it seems-  able to tell me Laura [name] today-  but not really clear otherwise-  minimal UOP recorded -  crt up  Objective Vital signs in last 24 hours: Vitals:   01/05/22 0700 01/05/22 0732 01/05/22 0800 01/05/22 0900  BP: 137/81  134/65 138/83  Pulse: 87  (!) 130 78  Resp: 17  19 17   Temp:  98.5 F (36.9 C)    TempSrc:  Axillary    SpO2: 96%  96% 99%  Weight:      Height:       Weight change:   Intake/Output Summary (Last 24 hours) at 01/05/2022 0912 Last data filed at 01/05/2022 0600 Gross per 24 hour  Intake 1545.73 ml  Output 55 ml  Net 1490.73 ml    Assessment/ Plan: Pt is a 61 y.o. yo adult who was admitted on 12/27/2021 with  weakness -  found to have pancytopenia in the setting of known liver dz-  also has developed AKI  Assessment/Plan: 1. Renal-  crt 0.5 in June of 2023-  crt on 9/9 was 2.1---- down to 1.3 on 9/12 but then started to rise -  up to 3 today -  had some hypotension around that time.  Differential includes ATN vs HRS.  Hemodynamics have improved but UOP still low.  Urine sodium 11.  Receiving max medical therapy for HRS-  on levo and now midodrine, octreotide and albumin-  crt still rising but no absolute indications for HD.  Not sure pt can comprehend the decisions that face him at this time 2. HTN/volume-  does not seem that volume overloaded-  MAP better -  weaning off of levo--- on midodrine  3. Anemia-  received many transfusions since admit-  heme previously involved considering BMBx but now have signed off 4. Liver dz-  bili is improving -  unclear if actually has cirrhosis 5. Metabolic acidosis-  improving with oral sodium bicarb   Laura [name]    Labs: Basic Metabolic Panel: Recent Labs  Lab 01/03/22 0440 01/03/22 1745 01/04/22 0852 01/05/22 0507  NA 132* 134*  --  133*  K 3.1* 4.3  --  4.0  CL 97* 102  --  104  CO2 23  15*  --  17*  GLUCOSE 219* 117*  --  99  BUN 47* 56*  --  64*  CREATININE 2.07* 2.59*  --  2.99*  CALCIUM 7.9* 9.2  --  8.8*  PHOS 1.8*  --  3.5 3.7   Liver Function Tests: Recent Labs  Lab 01/01/22 0256 01/01/22 0948 01/02/22 0345 01/03/22 0440 01/04/22 0430 01/05/22 0507  AST 46*  --  44* 32  --   --   ALT 25  --  22 19  --   --   ALKPHOS 105  --  109 107  --   --   BILITOT 13.0*   < > 12.0* 8.9* 9.0* 7.8*  PROT 4.8*  --  4.9* 4.6*  --   --   ALBUMIN 1.5*  --  2.4* 2.2*  --  2.4*   < > = values in this interval not displayed.   No results for input(s): "LIPASE", "AMYLASE" in the last 168 hours. Recent Labs  Lab 12/31/21 0932  AMMONIA 45*   CBC: Recent Labs  Lab 12/30/21 0310 12/30/21 1032 01/03/22 0440 01/03/22 1602 01/04/22 0430 01/04/22 1749  01/05/22 0507  WBC 8.4  8.4   < > 17.4* 19.3* 20.7* 20.4* 26.4*  NEUTROABS 6.7  --   --   --   --   --   --   HGB 8.6*  8.5*   < > 7.0* 6.5* 8.5* 7.6* 7.6*  HCT 24.5*  24.4*   < > 21.0* 19.6* 25.7* 21.7* 21.6*  MCV 95.7  95.7   < > 97.7 100.5* 96.3 93.1 94.3  PLT 22*  23*   < > 42*  43* 45* 53*  53* 55* 56*  54*   < > = values in this interval not displayed.   Cardiac Enzymes: No results for input(s): "CKTOTAL", "CKMB", "CKMBINDEX", "TROPONINI" in the last 168 hours. CBG: Recent Labs  Lab 01/04/22 1606 01/04/22 1935 01/04/22 2324 01/05/22 0330 01/05/22 0729  GLUCAP 84 87 76 75 78    Iron Studies: No results for input(s): "IRON", "TIBC", "TRANSFERRIN", "FERRITIN" in the last 72 hours. Studies/Results: DG Abd 1 View  Result Date: 01/03/2022 CLINICAL DATA:  Vomiting, orogastric tube placement. EXAM: ABDOMEN - 1 VIEW COMPARISON:  Abdominal radiograph dated 01/01/2022. FINDINGS: There has been interval placement of an enteric tube which terminates in the stomach. An enteric tube which was previously in position in the stomach is unchanged. IMPRESSION: Two enteric tubes terminate in the stomach.  Electronically Signed   By: Zerita Boers M.D.   On: 01/03/2022 12:17   Medications: Infusions:  sodium chloride 10 mL/hr at 01/05/22 0600   dextrose 20 mL/hr at 01/05/22 0600   feeding supplement (OSMOLITE 1.5 CAL)     norepinephrine (LEVOPHED) Adult infusion 1 mcg/min (01/05/22 0600)   octreotide (SANDOSTATIN) 500 mcg in sodium chloride 0.9 % 250 mL (2 mcg/mL) infusion 50 mcg/hr (01/05/22 0858)    Scheduled Medications:  Chlorhexidine Gluconate Cloth  6 each Topical Q0600   feeding supplement (PROSource TF20)  60 mL Per Tube BID   folic acid  1 mg Intravenous Daily   Gerhardt's butt cream   Topical BID   lactulose  10 g Per Tube BID   leptospermum manuka honey  1 Application Topical Daily   midodrine  15 mg Per Tube Q8H   mouth rinse  15 mL Mouth Rinse 4 times per day   pantoprazole (PROTONIX) IV  40 mg Intravenous Q24H   sodium bicarbonate  1,300 mg Per Tube BID   sodium chloride flush  10-40 mL Intracatheter Q12H    have reviewed scheduled and prn medications.  Physical Exam: General: seems encephalopathic-  crying out at times but able to tell me he is at [place]: RRR Lungs: mostly clear Abdomen: soft, non tender Extremities: min edema     01/05/2022,9:12 AM  LOS: 9 days

## 2022-01-05 NOTE — Progress Notes (Signed)
Physical Therapy Treatment Patient Details Name: Laura [name] MRN: [id number] DOB: Aug 03, 1960 Today's Date: 01/05/2022   History of Present Illness Patient is a 61 y/o female who presents on 9/9 with weakness and a fall. Admitted with severe anemia, Acute hypoxemic respiratory failure, septic shock, SVTs, hypotension, acute kidney injury, SIRS, mild rhabdomyolosis. PMH includes chronic back pain, alcohol use and tobacco use disorder.    PT Comments    Pt was agreeable to participate today, but limited by anxiety/agitation as session pushed ability for pt to cope.  Emphasis on warm up, rolling, transition from side to sit, working on sitting balance, tolerance at EOB.    Recommendations for follow up therapy are one component of a multi-disciplinary discharge planning process, led by the attending physician.  Recommendations may be updated based on patient status, additional functional criteria and insurance authorization.  Follow Up Recommendations  Skilled nursing-short term rehab (<3 hours/day) Can patient physically be transported by private vehicle: No   Assistance Recommended at Discharge Frequent or constant Supervision/Assistance  Patient can return home with the following Two people to help with walking and/or transfers;A lot of help with bathing/dressing/bathroom;Assistance with cooking/housework;Assist for transportation   Equipment Recommendations  None recommended by PT    Recommendations for Other Services       Precautions / Restrictions Precautions Precautions: Fall Precaution Comments: watch 02     Mobility  Bed Mobility Overal bed mobility: Needs Assistance Bed Mobility: Rolling, Sidelying to Sit, Sit to Supine Rolling: Max assist Sidelying to sit: Max assist, +2 for physical assistance   Sit to supine: Total assist, +2 for physical assistance   General bed mobility comments: pt was adequately participative with transition to sitting initially, but became  anxious and degraded behaviorally within session.    Transfers                   General transfer comment: Declined.    Ambulation/Gait               General Gait Details: Declined   Stairs             Wheelchair Mobility    Modified Rankin (Stroke Patients Only) Modified Rankin (Stroke Patients Only) Pre-Morbid Rankin Score: No symptoms Modified Rankin: No symptoms     Balance Overall balance assessment: Needs assistance Sitting-balance support: Feet supported, Single extremity supported, Bilateral upper extremity supported Sitting balance-Leahy Scale: Zero Sitting balance - Comments: posterior bias at EOB for 8-10 min.  Pt improving with less forward input until he was done with the task and ready to stop.                                    Cognition Arousal/Alertness: Awake/alert Behavior During Therapy: Anxious (crying, dramatic at times) Overall Cognitive Status: No family/caregiver present to determine baseline cognitive functioning                                          Exercises Other Exercises Other Exercises: warm up hip/knee flex/ext ROM bil LE's x10 reps    General Comments        Pertinent Vitals/Pain Pain Assessment Pain Assessment: Faces Faces Pain Scale: Hurts even more Pain Location: general, back legs Pain Descriptors / Indicators: Discomfort, Grimacing, Guarding, Moaning Pain Intervention(s): Monitored during session, Limited activity  within patient's tolerance    Home Living                          Prior Function            PT Goals (current goals can now be found in the care plan section) Acute Rehab PT Goals Patient Stated Goal: to get some sleep PT Goal Formulation: With patient Time For Goal Achievement: 01/13/22 Potential to Achieve Goals: Fair Progress towards PT goals: Progressing toward goals (any mobility is progress at this point.)    Frequency    Min  2X/week      PT Plan Current plan remains appropriate    Co-evaluation              AM-PAC PT "6 Clicks" Mobility   Outcome Measure  Help needed turning from your back to your side while in a flat bed without using bedrails?: Total Help needed moving from lying on your back to sitting on the side of a flat bed without using bedrails?: Total Help needed moving to and from a bed to a chair (including a wheelchair)?: Total Help needed standing up from a chair using your arms (e.g., wheelchair or bedside chair)?: Total Help needed to walk in hospital room?: Total Help needed climbing 3-5 steps with a railing? : Total 6 Click Score: 6    End of Session Equipment Utilized During Treatment: Oxygen Activity Tolerance: Patient limited by fatigue;Patient limited by lethargy (limited by anxiety/agitation) Patient left: in bed;with call bell/phone within reach;with bed alarm set;with nursing/sitter in room Nurse Communication: Mobility status PT Visit Diagnosis: Other abnormalities of gait and mobility (R26.89);Muscle weakness (generalized) (M62.81);Pain Pain - part of body:  (general)     Time: 6389-3734 PT Time Calculation (min) (ACUTE ONLY): 26 min  Charges:  $Therapeutic Activity: 23-37 mins                     01/05/2022  Ginger Carne., PT Acute Rehabilitation Services [phone]  (office)   [name] 01/05/2022, 5:41 PM

## 2022-01-05 NOTE — Progress Notes (Signed)
This chaplain responded to PMT NP-Michelle consult for creating/updating the Pt. Advance Directive.   The chaplain received an update from the Pt. RN-Ryan before the visit. The chaplain understands the Pt. may need more time to actively participate in Pt. education. The chaplain will coordinate with the PMT to determine a time for the next visit.  Chaplain Sallyanne Kuster [phone]

## 2022-01-06 DIAGNOSIS — K7030 Alcoholic cirrhosis of liver without ascites: Secondary | ICD-10-CM | POA: Diagnosis not present

## 2022-01-06 DIAGNOSIS — N179 Acute kidney failure, unspecified: Secondary | ICD-10-CM | POA: Diagnosis not present

## 2022-01-06 DIAGNOSIS — Z515 Encounter for palliative care: Secondary | ICD-10-CM | POA: Diagnosis not present

## 2022-01-06 DIAGNOSIS — R5383 Other fatigue: Secondary | ICD-10-CM | POA: Diagnosis not present

## 2022-01-06 LAB — CULTURE, BLOOD (ROUTINE X 2)
Culture: NO GROWTH
Culture: NO GROWTH
Special Requests: ADEQUATE
Special Requests: ADEQUATE

## 2022-01-06 LAB — BILIRUBIN, FRACTIONATED(TOT/DIR/INDIR)
Bilirubin, Direct: 4 mg/dL — ABNORMAL HIGH (ref 0.0–0.2)
Indirect Bilirubin: 2.9 mg/dL — ABNORMAL HIGH (ref 0.3–0.9)
Total Bilirubin: 6.9 mg/dL — ABNORMAL HIGH (ref 0.3–1.2)

## 2022-01-06 LAB — CBC
HCT: 23.3 % — ABNORMAL LOW (ref 36.0–46.0)
Hemoglobin: 8 g/dL — ABNORMAL LOW (ref 12.0–15.0)
MCH: 33.2 pg (ref 26.0–34.0)
MCHC: 34.3 g/dL (ref 30.0–36.0)
MCV: 96.7 fL (ref 80.0–100.0)
Platelets: 59 10*3/uL — ABNORMAL LOW (ref 150–400)
RBC: 2.41 MIL/uL — ABNORMAL LOW (ref 3.87–5.11)
RDW: 27.7 % — ABNORMAL HIGH (ref 11.5–15.5)
WBC: 23.8 10*3/uL — ABNORMAL HIGH (ref 4.0–10.5)
nRBC: 0 % (ref 0.0–0.2)

## 2022-01-06 LAB — RENAL FUNCTION PANEL
Albumin: 2.3 g/dL — ABNORMAL LOW (ref 3.5–5.0)
Anion gap: 13 (ref 5–15)
BUN: 69 mg/dL — ABNORMAL HIGH (ref 6–20)
CO2: 17 mmol/L — ABNORMAL LOW (ref 22–32)
Calcium: 8.7 mg/dL — ABNORMAL LOW (ref 8.9–10.3)
Chloride: 105 mmol/L (ref 98–111)
Creatinine, Ser: 3.25 mg/dL — ABNORMAL HIGH (ref 0.44–1.00)
GFR, Estimated: 16 mL/min — ABNORMAL LOW (ref >60)
Glucose, Bld: 146 mg/dL — ABNORMAL HIGH (ref 70–99)
Phosphorus: 3.8 mg/dL (ref 2.5–4.6)
Potassium: 3.6 mmol/L (ref 3.5–5.1)
Sodium: 135 mmol/L (ref 135–145)

## 2022-01-06 LAB — BASIC METABOLIC PANEL
Anion gap: 13 (ref 5–15)
BUN: 69 mg/dL — ABNORMAL HIGH (ref 6–20)
CO2: 16 mmol/L — ABNORMAL LOW (ref 22–32)
Calcium: 8.7 mg/dL — ABNORMAL LOW (ref 8.9–10.3)
Chloride: 107 mmol/L (ref 98–111)
Creatinine, Ser: 3.27 mg/dL — ABNORMAL HIGH (ref 0.44–1.00)
GFR, Estimated: 16 mL/min — ABNORMAL LOW (ref >60)
Glucose, Bld: 130 mg/dL — ABNORMAL HIGH (ref 70–99)
Potassium: 3.8 mmol/L (ref 3.5–5.1)
Sodium: 136 mmol/L (ref 135–145)

## 2022-01-06 LAB — GLUCOSE, CAPILLARY
Glucose-Capillary: 102 mg/dL — ABNORMAL HIGH (ref 70–99)
Glucose-Capillary: 112 mg/dL — ABNORMAL HIGH (ref 70–99)
Glucose-Capillary: 114 mg/dL — ABNORMAL HIGH (ref 70–99)
Glucose-Capillary: 122 mg/dL — ABNORMAL HIGH (ref 70–99)
Glucose-Capillary: 122 mg/dL — ABNORMAL HIGH (ref 70–99)
Glucose-Capillary: 128 mg/dL — ABNORMAL HIGH (ref 70–99)
Glucose-Capillary: 131 mg/dL — ABNORMAL HIGH (ref 70–99)

## 2022-01-06 LAB — MAGNESIUM: Magnesium: 2.3 mg/dL (ref 1.7–2.4)

## 2022-01-06 MED ORDER — FOLIC ACID 1 MG PO TABS
1.0000 mg | ORAL_TABLET | Freq: Every day | ORAL | Status: DC
  Administered 2022-01-07 – 2022-01-14 (×8): 1 mg
  Filled 2022-01-06 (×8): qty 1

## 2022-01-06 MED ORDER — POTASSIUM CHLORIDE 10 MEQ/100ML IV SOLN
10.0000 meq | INTRAVENOUS | Status: AC
  Administered 2022-01-06 (×5): 10 meq via INTRAVENOUS
  Filled 2022-01-06 (×5): qty 100

## 2022-01-06 MED ORDER — FUROSEMIDE 10 MG/ML IJ SOLN
160.0000 mg | Freq: Once | INTRAVENOUS | Status: DC
  Administered 2022-01-06: 160 mg via INTRAVENOUS
  Filled 2022-01-06: qty 10

## 2022-01-06 MED ORDER — OSMOLITE 1.5 CAL PO LIQD: 1000.0000 mL | ORAL | Status: DC

## 2022-01-06 NOTE — Progress Notes (Signed)
Patient ID: Laura [name], adult   DOB: [birth year], 61 y.o.   MRN: [id number]    Progress Note from the Palliative Medicine Team at [facility]   Patient Name: Laura [name]        Date: 01/06/2022 DOB: 26-Sep-1960  Age: 61 y.o. MRN#: [id number] Attending Physician: Freddi Starr, MD Primary Care Physician: Lauree Chandler, NP Admit Date: 12/27/2021   Medical records reviewed, discussed with attending team  Laura [name]" ) Laura [name] is a 61 y.o. adult who identifies as female with PMH of chronic back pain, alcohol abuse, anemia, HTN, and HLD who presented to [facility] ED on 12/27/2021 with reports of weakness and fall. Initial labs revealed severe anemia, elevated bilirubin, and elevated ammonia. Admitted to PCCM with septic shock with unclear source. Also with symptomatic anemia, coagulopathy, SVT/hypotension, AKI, mild rhabdomyolysis, and failure to thrive.   Initial palliative consult 12/30/2021  This NP assessed patient at the bedside as a follow up palliative medicine needs and emotional support.  Patient is lethargic and unable to follow commands currently.  He does not have decision-making capacity at this time.  Spoke to patient's son/Laura [name] by telephone.  Laura [name] understands the seriousness of this of the patient's current medical situation, he is appreciative for updates by the attending team.  At this time Laura [name] is open for all offered and available medical interventions to prolong life for patient Laura [name] "Laura [name].  Education offered on the seriousness of the patient's current medical situation and the high risk for decompensation.  Long-term prognosis is tenuous.  Education offered on the difficult decisions that he will likely be facing specific to initiation of hemodialysis and possible need for resuscitation/intubation.  Education offered today to Laura [name], regarding  the importance of continued conversation with the  medical providers regarding overall plan of care and treatment  options,  ensuring decisions are within the context of the patients values and GOCs.  PMT will continue to support holistically   Questions and concerns addressed     Laura Lessen NP  Palliative Medicine Team Team Phone # [phone] Pager [phone]

## 2022-01-06 NOTE — TOC Progression Note (Signed)
Transition of Care [facility]) - Initial/Assessment Note    Patient Details  Name: Laura [name] MRN: [id number] Date of Birth: 04/13/1961  Transition of Care [facility]) CM/SW Contact:    Milinda Antis, [place] Phone Number: 01/06/2022, 3:03 PM  Clinical Narrative:                 PT recommendation is SNF.  TOC will continue to follow patient and complete SNF referral when patient is closer to being medically ready.      Barriers to Discharge: Continued Medical Work up   Patient Goals and CMS Choice Patient states their goals for this hospitalization and ongoing recovery are:: To return home CMS Medicare.gov Compare Post Acute Care list provided to:: Patient    Expected Discharge Plan and Services     Discharge Planning Services: CM Consult   Living arrangements for the past 2 months: Apartment                                      Prior Living Arrangements/Services Living arrangements for the past 2 months: Apartment Lives with:: Significant Other Patient language and need for interpreter reviewed:: Yes Do you feel safe going back to the place where you live?: Yes      Need for Family Participation in Patient Care: Yes (Comment) Care giver support system in place?: Yes (comment)   Criminal Activity/Legal Involvement Pertinent to Current Situation/Hospitalization: No - Comment as needed  Activities of Daily Living Home Assistive Devices/Equipment: None ADL Screening (condition at time of admission) Patient's cognitive ability adequate to safely complete daily activities?: No Is the patient deaf or have difficulty hearing?: No Does the patient have difficulty seeing, even when wearing glasses/contacts?: No Does the patient have difficulty concentrating, remembering, or making decisions?: No Patient able to express need for assistance with ADLs?: No Does the patient have difficulty dressing or bathing?: No Independently performs ADLs?: Yes (appropriate for developmental  age) Communication: Independent Does the patient have difficulty walking or climbing stairs?: Yes Weakness of Legs: Both Weakness of Arms/Hands: Both  Permission Sought/Granted Permission sought to share information with : Case Manager, Family Supports Permission granted to share information with : Yes, Verbal Permission Granted              Emotional Assessment Appearance:: Appears stated age Attitude/Demeanor/Rapport: Engaged, Gracious Affect (typically observed): Accepting, Appropriate, Calm, Hopeful, Pleasant Orientation: : Oriented to Self, Oriented to Place, Oriented to  Time, Oriented to Situation Alcohol / Substance Use: Not Applicable Psych Involvement: No (comment)  Admission diagnosis:  Hyperbilirubinemia [E80.6] SVT (supraventricular tachycardia) (HCC) [I47.1] Anemia [D64.9] AKI (acute kidney injury) ([place]) [N17.9] Symptomatic anemia [D64.9] Patient Active Problem List   Diagnosis Date Noted   Thrombocytopenia ([place])    AKI (acute kidney injury) ([place])    Alcoholic hepatitis with ascites    Alcoholic cirrhosis of liver without ascites (HCC)    Pressure injury of skin 12/28/2021   Anemia 12/27/2021   Conjunctivitis 08/01/2021   Vitamin D deficiency 08/01/2021   Erythema of abdominal wall 08/01/2021   Hyperbilirubinemia 07/30/2021   Elevated alkaline phosphatase level 07/30/2021   SVT (supraventricular tachycardia) ([place]) 07/30/2021   Hypocalcemia 07/30/2021   Edema 07/30/2021   UTI (urinary tract infection) 07/30/2021   Folate deficiency 07/30/2021   Exertional shortness of breath    Symptomatic anemia 07/29/2021   Hypotension 06/30/2021   Hypokalemia 06/30/2021   Normocytic anemia 06/30/2021  Hypomagnesemia 06/30/2021   Lactic acidosis 06/29/2021   Estrogen deficiency 09/30/2018   Macrocytic anemia 04/12/2018   Tobacco abuse 04/12/2018   Essential hypertension 04/12/2018   Body mass index (BMI) of 37.0-37.9 in adult 04/12/2018   Obesity (BMI  30.0-34.9) 09/29/2016   Low back pain 10/07/2015   Chronic night sweats 03/03/2015   Generalized anxiety disorder 06/07/2014   Allergic rhinitis 03/02/2013   Panic attack 09/05/2012   Insomnia 07/28/2012   Depression 07/28/2012   OA (osteoarthritis) of knee 07/28/2012   PCP:  Sharon Seller, NP Pharmacy:   [id number] - [place], [place] - [address] AT [facility] OF [address] [place] [address] Phone: [phone] Fax: [phone]  [facility] #[id number] [place], [place] - [address] AT [facility] OF [address] & [facility] [place] [address] Phone: [phone] Fax: [phone]  [facility] ([facility]) - [place], [place] - [address] [place] [address] Phone: [phone] Fax: [phone]     Social Determinants of Health (SDOH) Interventions    Readmission Risk Interventions    07/02/2021   11:32 AM  Readmission Risk Prevention Plan  Post Dischage Appt Complete  Medication Screening Complete  Transportation Screening Complete

## 2022-01-06 NOTE — Progress Notes (Signed)
NAME:  Laura KiefKathy Ragen, MRN:  [id number], DOB:  May 05, 1960, LOS: 10 ADMISSION DATE:  12/27/2021, CONSULTATION DATE:  01/06/22 CHIEF COMPLAINT:  Symptomatic Anemia, Oliguric AKI, ETOH Hepatitis/HRS   History of Present Illness:  61 y/o patient who [facility] ER on 9/9 via EMS with reports of weakness and fall.     The patient was admitted in April 2023 with symptomatic anemia, hypotension, SVT, electrolyte disturbances, UTI & conjunctivitis.  He was seen by Hematology during that admission and his anemia was felt to be related to anemia of chronic disease in the setting of liver disease. He was also seen by GI and was planned to follow up with GI as outpatient but it does not appear he was able to follow up.     Laura NeedleMichael identifies as female, lives in an apartment.  His cousin assists in his medical care at home.  He reportedly has had progressive weakness.  He attempted to go to the bathroom and fell.  He apparently was on the floor for at least a day.  EMS was activated and found the patient to be hypotensive with SBP in 60's and in SVT.  He was apparently difficult to stick for an IV. The patient reports he continues to smoke 1/2ppd, quit drinking approximately 7 months ago. Per his cousin, he has had episodes of incontinence with moisture associated rash on peri area.  In ER, the patient required O2 to maintain saturations.     Initial ER evaluation notable for recurrent episodes of SVT, hypotension.  Labs -Na 136, K 3.1, Cl 96, glucose 75, BUN 29, Cr 2.14, calcium 8.5, AG 20, albumin 2.3, AST 99 / ALT 36, ammonia 60, TSH 2.8, lactic acid 4, WBC 5.5, Hgb 5, Hct 15.5, MCV 140, platelets 77 & INR 1.5.  CXR negative. COVID negative. Blood & urine cultures pending.   Pertinent  Medical History  Chronic Back Pain  Tobacco Abuse  ETOH Abuse - reportedly quit Feb 2023, but reports of active ETOH use prior to admission Insomnia  Laminectomy   Significant Hospital Events: Including procedures,  antibiotic start and stop dates in addition to other pertinent events   9/9 Admit s/p mechanical fall initial Hgb 5 > 2 U pRBC-Vancomycin and Cefepime started 9/11 Hgb 6.8 > 1U pRBC; Bcx 1 NG 2 days-pulled off vancomycin 9/12 1/2 Bcx grew staphylococcus species, possible contaminant 9/13 continued thrombocytopenia, hem onc consulted. Worsening encephalopathy-EEG severe encephalopathy/cefepime toxicity, CT head no acute issues, stopped cefepime switched to CTX 9/14 Increasing WBC-repeat Bcx, restart vancomycin and continue CTX-Bcx Staphylococcus warneri 1/2 likely contaminant, Cortrak placed, Albumin x 2, MAP goal of 80-85 possible hepatorenal 9/15 levophed, albumin and octreotide added for potential hepatorenal syndrome 9/16 NGT placed for N/V 9/17 Nephrology consult, recs: Continue with biopsy if clinically relevant, continue with rifaximin and Lactulose for hepatic encephalopath7y  Interim History / Subjective:  No acute events overnight. 85 ml UOP overnight. No BM's overnight.   Maintained appropriate MAPs s/p stopping NE. Normal HR Tolerated tube feeds well without reported N/V. Oriented to person and provider's presence, but not place or time.   Objective   Blood pressure (!) 114/90, pulse 73, temperature 97.8 F (36.6 C), temperature source Oral, resp. rate 17, height 5\' 7"  (1.702 m), weight 76.7 kg, SpO2 96 %.        Intake/Output Summary (Last 24 hours) at 01/06/2022 0859 Last data filed at 01/06/2022 0800 Gross per 24 hour  Intake 1853.21 ml  Output 295 ml  Net 1558.21 ml   Filed Weights   01/02/22 0500 01/05/22 0500 01/06/22 0500  Weight: 76.2 kg 78.6 kg 76.7 kg    Examination: General: Chronically ill appearing but oriented to person and provider. Able to vocalize.  HENT: MM pale/moist, pupils 4/reactive, scleral icterus  Lungs:  Non labored breathing, Persistent slight expiratory wheeze but otherwise CTA bilaterally Cardiovascular: RRR, no murmurs, rubs or  gallops Abdomen: stable distension, soft, non tympanic, non tender.  Extremities: warm/dry, +2 LE edema ,  Neuro: opens eyes to conversation, oriented to person, but not place, year/ month. MAE-generally weak, follow simple commands but remains confused> waxes/ wanes Skin: no purpura or skin changes  Resolved Hospital Problem list     Assessment & Plan:   ETOH Hepatitis  Hepatorenal syndrome Anion Gap Metabolic Acidosis Patient has likely EToH hepatitis with adjunct HRS per GI consult evaluation. Minmial ascites was seen on bedside US on 9/16. GI saw patient on 9/18 and recommended to proceed with liver bx if clinically relevant and to continue with rifaxamin and lactulose for hepatic encephalopathy. Status post albumin challenge 9/17.  As of today, patient has worsening renal function (Cr at 3.25 from 2.99), UOP remains low, decreasing albumin (2.3 from 2.4). Potassium downtrending (3.6 from 4). Has demonstrated stable pressures after cessation of NE yesterday. ANA and Anti-mitochondrial labs wnl.  - Continue current treatment for likely hepatorenal syndrome with midodrine per TF and ocretoride infusion.  - continue to hold NE - 160 mg lasix trial today per nephrology - Start sodium bicarb tabs TID - Continue rifaximin 550 mg, TID  - Continue lactulose BID - Continue sodium bicarb per cortrak - replete electrolytes PRN - no urgent HD needs  Pancytopenia Hematology has signed off given resolution of cytopenias. No need for BMBx at this time per Heme.  - DIC panel remains neg.   - H/H stable, continue to monitor  - transfuse for hemoglobin <7g/dL   Encephalopathy 2/2 to cefepime toxicity, mild hyperammonemia, multiorgan failure Cefepime discontinued based on EEG results and transitioned to ceftriaxone 9/13, completed on 9/15. CT head without acute issues. Treating for wernicke's - confusion seems to wax/ wane, continue to monitor closely - cont thiamine - continue  lactulose/rifaximin  - Continue fentanyl prn pain given renal function   Ileus  N/V Ongoing. No nausea and vomiting developed 9/16, NG tube placed to low intermittent suction.  Clamped overnight 9/17.  Trickle feeds started 9/18. Increase feeds - Increase tube feeds - Aspiration precautions - Treat N/V with prn anti-emetics   Sepsis 2/2 Urinary Source or Skin Wounds   Blood culture did grow Staphylococcus warneri in 1/2 likely contaminant. 7 days of cefepime/ceftriaxone completed 9/15. Blood culture 9/14 negative to date. As of today, WBC down trending to 23.8 from 26. Wound is stable.  - Continue wound care   SVT Resolved - goal K+>4, Mg+ >2 - Vasovagal maneuvers PRN   Adult Failure to Thrive  -Pt request full code on admission.  Feel he has poor insight into his medical care.  He states if he could not make decisions for himself, he would want [name] [phone] to make decisions for him and his goddaughter. Family meeting held 9/16. Discussed need for review of code status in coming days. Family now has better understanding of the critical condition patient is in. As of 9/17, patient still wants full aggressive medical care   Skin wounds on buttocks, perineum, sacrum -wound care management  Best Practice    Diet/type: tubefeeds  per cortrak DVT prophylaxis: SCD GI prophylaxis: PPI Lines: N/A Foley:  Yes, and it is still needed Code Status:  full code Last date of multidisciplinary goals of care discussion [9/9, 9/17]  Labs   CBC: Recent Labs  Lab 01/03/22 1602 01/04/22 0430 01/04/22 1749 01/05/22 0507 01/06/22 0540  WBC 19.3* 20.7* 20.4* 26.9*  26.4* 23.8*  NEUTROABS  --   --   --  23.8*  --   HGB 6.5* 8.5* 7.6* 7.5*  7.6* 8.0*  HCT 19.6* 25.7* 21.7* 21.7*  21.6* 23.3*  MCV 100.5* 96.3 93.1 95.2  94.3 96.7  PLT 45* 53*  53* 55* 54*  56*  54* 59*    Basic Metabolic Panel: Recent Labs  Lab 01/02/22 1622 01/02/22 2224 01/03/22 0440 01/03/22 1745  01/04/22 0852 01/05/22 0507 01/06/22 0540  NA 133* 133* 132* 134*  --  133* 135  K 3.6 3.0* 3.1* 4.3  --  4.0 3.6  CL 102 96* 97* 102  --  104 105  CO2 19* 22 23 15*  --  17* 17*  GLUCOSE 137* 197* 219* 117*  --  99 146*  BUN 53* 46* 47* 56*  --  64* 69*  CREATININE 2.13* 1.89* 2.07* 2.59*  --  2.99* 3.25*  CALCIUM 8.8* 7.6* 7.9* 9.2  --  8.8* 8.7*  MG  --  1.5* 1.7  --  1.9 1.9 2.3  PHOS 2.4*  --  1.8*  --  3.5 3.7 3.8   GFR: Estimated Creatinine Clearance (by C-G formula based on SCr of 3.25 mg/dL (H)) Female: 09.9 mL/min (A) Female: 22.9 mL/min (A) Recent Labs  Lab 01/01/22 0946 01/01/22 1703 01/04/22 0430 01/04/22 1749 01/05/22 0507 01/06/22 0540  WBC  --    < > 20.7* 20.4* 26.9*  26.4* 23.8*  LATICACIDVEN 0.7  --   --   --   --   --    < > = values in this interval not displayed.    Liver Function Tests: Recent Labs  Lab 12/31/21 0524 12/31/21 1218 01/01/22 0256 01/01/22 0948 01/02/22 0345 01/03/22 0440 01/04/22 0430 01/05/22 0507 01/06/22 0540  AST 43*  --  46*  --  44* 32  --   --   --   ALT 23  --  25  --  22 19  --   --   --   ALKPHOS 88  --  105  --  109 107  --   --   --   BILITOT 13.3*   < > 13.0*   < > 12.0* 8.9* 9.0* 7.8* 6.9*  PROT 4.4*  --  4.8*  --  4.9* 4.6*  --   --   --   ALBUMIN 1.5*  --  1.5*  --  2.4* 2.2*  --  2.4* 2.3*   < > = values in this interval not displayed.   No results for input(s): "LIPASE", "AMYLASE" in the last 168 hours. Recent Labs  Lab 12/31/21 0932  AMMONIA 45*    ABG    Component Value Date/Time   TCO2 24 12/27/2021 1751     Coagulation Profile: Recent Labs  Lab 01/02/22 0345 01/02/22 0445 01/03/22 0440 01/04/22 0430 01/05/22 0507  INR QUANTITY NOT SUFFICIENT, UNABLE TO PERFORM TEST  1.5* 1.4* 1.4*  1.4* 1.4* 1.5*    Cardiac Enzymes: No results for input(s): "CKTOTAL", "CKMB", "CKMBINDEX", "TROPONINI" in the last 168 hours.  HbA1C: Hgb A1c MFr Bld  Date/Time Value Ref Range  Status  07/01/2015  09:33 AM 4.8 4.8 - 5.6 % Final    Comment:             Pre-diabetes: 5.7 - 6.4          Diabetes: >6.4          Glycemic control for adults with diabetes: <7.0     CBG: Recent Labs  Lab 01/05/22 1505 01/05/22 1943 01/05/22 2333 01/06/22 0400 01/06/22 0724  GLUCAP 103* 115* 123* 131* 128*    Past Medical History:  He,  has a past medical history of Acute bronchitis, Cervical high risk HPV (human papillomavirus) test positive (12/2017), Chronic back pain, Complication of anesthesia, Cough, Insomnia, unspecified, Other malaise and fatigue, Other specified diseases of blood and blood-forming organs(289.89), and Tobacco use disorder.   Surgical History:   Past Surgical History:  Procedure Laterality Date   BIOPSY  07/01/2021   Procedure: BIOPSY;  Surgeon: Carol Ada, MD;  Location: [place];  Service: Gastroenterology;;  EGD and COLON   COLONOSCOPY WITH PROPOFOL N/A 07/01/2021   Procedure: COLONOSCOPY WITH PROPOFOL;  Surgeon: Carol Ada, MD;  Location: [place];  Service: Gastroenterology;  Laterality: N/A;   ESOPHAGOGASTRODUODENOSCOPY N/A 03/17/2013   Procedure: ESOPHAGOGASTRODUODENOSCOPY (EGD);  Surgeon: Ladene Artist, MD;  Location: [facility] ENDOSCOPY;  Service: Endoscopy;  Laterality: N/A;   ESOPHAGOGASTRODUODENOSCOPY (EGD) WITH PROPOFOL N/A 07/01/2021   Procedure: ESOPHAGOGASTRODUODENOSCOPY (EGD) WITH PROPOFOL;  Surgeon: Carol Ada, MD;  Location: [place];  Service: Gastroenterology;  Laterality: N/A;   FRACTURE SURGERY     Laminectomy and foraminotomy at L5-S1    12/12/2019   TONSILLECTOMY       Social History:   reports that he has been smoking cigarettes. He has a 6.00 pack-year smoking history. He has never used smokeless tobacco. He reports that he does not currently use alcohol. He reports that he does not use drugs.   Family History:  His family history includes Arthritis in his mother; Hypertension in his brother, sister, sister, sister, and sister.    Allergies Allergies  Allergen Reactions   Garlic Anaphylaxis   Aspirin Hives   Lyrica [Pregabalin]     Nightmares, did not help neuropathy   Penicillins Hives    Tolerated ceftriaxone 12/2021    Attestation for Student Documentation:  The history, physical exam and medical decision-making activities of this service were discussed have verified that the service and findings are accurately documented in the student's note.  Riesa Pope, MD 01/06/2022, 10:55 AM

## 2022-01-06 NOTE — Progress Notes (Signed)
Subjective:  fairly stable-  -  minimal UOP recorded -  crt up-  seems clearer mentally today   Objective Vital signs in last 24 hours: Vitals:   01/06/22 0400 01/06/22 0500 01/06/22 0727 01/06/22 0800  BP: 135/78   (!) 114/90  Pulse: 71   73  Resp: 16   17  Temp: 98.2 F (36.8 C)  97.8 F (36.6 C)   TempSrc: Oral  Oral   SpO2: 98%   96%  Weight:  76.7 kg    Height:       Weight change: -1.9 kg  Intake/Output Summary (Last 24 hours) at 01/06/2022 6063 Last data filed at 01/06/2022 0800 Gross per 24 hour  Intake 1853.21 ml  Output 295 ml  Net 1558.21 ml    Assessment/ Plan: Pt is a 61 y.o. yo adult who was admitted on 12/27/2021 with  weakness -  found to have pancytopenia in the setting of known liver dz-  also has developed AKI  Assessment/Plan: 1. Renal-  crt 0.5 in June of 2023-  crt on 9/9 was 2.1---- down to 1.3 on 9/12 but then started to rise -  up over 3 today -  had some hypotension around the time of crt rise.  Differential includes ATN vs HRS.  Hemodynamics have improved but UOP still low.  Urine sodium 11.  Receiving max medical therapy for HRS-  on  midodrine, octreotide -  crt still rising but no absolute indications for HD.  Not sure pt can comprehend the decisions that face him at this time 2. HTN/volume-  does not seem that volume overloaded-  MAP better -  weaned off of levo--- on midodrine.  CCM mentioned a lasix challenge -  can do  3. Anemia-  received many transfusions since admit-  heme previously involved considering BMBx but now have signed off 4. Liver dz-  bili is improving -  unclear if actually has cirrhosis-  GI says yes and put him on lactulose and rifaximine-  meld score 31 est 52% mortality in 3 mos  5. Metabolic acidosis-  stable with oral sodium bicarb   [name]    Labs: Basic Metabolic Panel: Recent Labs  Lab 01/03/22 1745 01/04/22 0852 01/05/22 0507 01/06/22 0540  NA 134*  --  133* 135  K 4.3  --  4.0 3.6  CL 102  --  104  105  CO2 15*  --  17* 17*  GLUCOSE 117*  --  99 146*  BUN 56*  --  64* 69*  CREATININE 2.59*  --  2.99* 3.25*  CALCIUM 9.2  --  8.8* 8.7*  PHOS  --  3.5 3.7 3.8   Liver Function Tests: Recent Labs  Lab 01/01/22 0256 01/01/22 0948 01/02/22 0345 01/03/22 0440 01/04/22 0430 01/05/22 0507 01/06/22 0540  AST 46*  --  44* 32  --   --   --   ALT 25  --  22 19  --   --   --   ALKPHOS 105  --  109 107  --   --   --   BILITOT 13.0*   < > 12.0* 8.9* 9.0* 7.8* 6.9*  PROT 4.8*  --  4.9* 4.6*  --   --   --   ALBUMIN 1.5*  --  2.4* 2.2*  --  2.4* 2.3*   < > = values in this interval not displayed.   No results for input(s): "LIPASE", "AMYLASE" in the last 168  hours. Recent Labs  Lab 12/31/21 0932  AMMONIA 45*   CBC: Recent Labs  Lab 01/03/22 1602 01/04/22 0430 01/04/22 1749 01/05/22 0507 01/06/22 0540  WBC 19.3* 20.7* 20.4* 26.9*  26.4* 23.8*  NEUTROABS  --   --   --  23.8*  --   HGB 6.5* 8.5* 7.6* 7.5*  7.6* 8.0*  HCT 19.6* 25.7* 21.7* 21.7*  21.6* 23.3*  MCV 100.5* 96.3 93.1 95.2  94.3 96.7  PLT 45* 53*  53* 55* 54*  56*  54* 59*   Cardiac Enzymes: No results for input(s): "CKTOTAL", "CKMB", "CKMBINDEX", "TROPONINI" in the last 168 hours. CBG: Recent Labs  Lab 01/05/22 1505 01/05/22 1943 01/05/22 2333 01/06/22 0400 01/06/22 0724  GLUCAP 103* 115* 123* 131* 128*    Iron Studies: No results for input(s): "IRON", "TIBC", "TRANSFERRIN", "FERRITIN" in the last 72 hours. Studies/Results: No results found. Medications: Infusions:  sodium chloride Stopped (01/05/22 1726)   dextrose 20 mL/hr at 01/06/22 0800   feeding supplement (OSMOLITE 1.5 CAL) 22 mL/hr at 01/06/22 0800   octreotide (SANDOSTATIN) 500 mcg in sodium chloride 0.9 % 250 mL (2 mcg/mL) infusion 50 mcg/hr (01/06/22 0800)    Scheduled Medications:  Chlorhexidine Gluconate Cloth  6 each Topical Q0600   feeding supplement (PROSource TF20)  60 mL Per Tube BID   folic acid  1 mg Intravenous Daily    Gerhardt's butt cream   Topical BID   lactulose  10 g Per Tube BID   leptospermum manuka honey  1 Application Topical Daily   midodrine  15 mg Per Tube Q8H   mouth rinse  15 mL Mouth Rinse 4 times per day   pantoprazole  40 mg Per Tube Daily   rifaximin  550 mg Per Tube TID   sodium bicarbonate  1,300 mg Per Tube BID   sodium chloride flush  10-40 mL Intracatheter Q12H   thiamine  100 mg Per Tube Daily    have reviewed scheduled and prn medications.  Physical Exam: General: seems clearer today -  asking why his kidneys are failing- I told him his liver " but I stopped drinking"  Heart: RRR Lungs: mostly clear Abdomen: soft, non tender Extremities: min edema -  legs are tender    01/06/2022,9:04 AM  LOS: 10 days

## 2022-01-07 ENCOUNTER — Inpatient Hospital Stay (HOSPITAL_COMMUNITY): Payer: Medicare Other

## 2022-01-07 DIAGNOSIS — K7030 Alcoholic cirrhosis of liver without ascites: Secondary | ICD-10-CM | POA: Diagnosis not present

## 2022-01-07 DIAGNOSIS — N179 Acute kidney failure, unspecified: Secondary | ICD-10-CM | POA: Diagnosis not present

## 2022-01-07 DIAGNOSIS — K7011 Alcoholic hepatitis with ascites: Secondary | ICD-10-CM | POA: Diagnosis not present

## 2022-01-07 DIAGNOSIS — D508 Other iron deficiency anemias: Secondary | ICD-10-CM | POA: Diagnosis not present

## 2022-01-07 LAB — RENAL FUNCTION PANEL
Albumin: 2.2 g/dL — ABNORMAL LOW (ref 3.5–5.0)
Anion gap: 13 (ref 5–15)
BUN: 75 mg/dL — ABNORMAL HIGH (ref 6–20)
CO2: 16 mmol/L — ABNORMAL LOW (ref 22–32)
Calcium: 8.7 mg/dL — ABNORMAL LOW (ref 8.9–10.3)
Chloride: 106 mmol/L (ref 98–111)
Creatinine, Ser: 3.3 mg/dL — ABNORMAL HIGH (ref 0.44–1.00)
GFR, Estimated: 15 mL/min — ABNORMAL LOW (ref >60)
Glucose, Bld: 122 mg/dL — ABNORMAL HIGH (ref 70–99)
Phosphorus: 3.7 mg/dL (ref 2.5–4.6)
Potassium: 3.7 mmol/L (ref 3.5–5.1)
Sodium: 135 mmol/L (ref 135–145)

## 2022-01-07 LAB — BASIC METABOLIC PANEL
Anion gap: 11 (ref 5–15)
BUN: 76 mg/dL — ABNORMAL HIGH (ref 6–20)
CO2: 14 mmol/L — ABNORMAL LOW (ref 22–32)
Calcium: 8.4 mg/dL — ABNORMAL LOW (ref 8.9–10.3)
Chloride: 110 mmol/L (ref 98–111)
Creatinine, Ser: 3.38 mg/dL — ABNORMAL HIGH (ref 0.44–1.00)
GFR, Estimated: 15 mL/min — ABNORMAL LOW (ref >60)
Glucose, Bld: 98 mg/dL (ref 70–99)
Potassium: 3.7 mmol/L (ref 3.5–5.1)
Sodium: 135 mmol/L (ref 135–145)

## 2022-01-07 LAB — CBC
HCT: 27.1 % — ABNORMAL LOW (ref 36.0–46.0)
Hemoglobin: 8.8 g/dL — ABNORMAL LOW (ref 12.0–15.0)
MCH: 32.2 pg (ref 26.0–34.0)
MCHC: 32.5 g/dL (ref 30.0–36.0)
MCV: 99.3 fL (ref 80.0–100.0)
Platelets: 65 10*3/uL — ABNORMAL LOW (ref 150–400)
RBC: 2.73 MIL/uL — ABNORMAL LOW (ref 3.87–5.11)
RDW: 28 % — ABNORMAL HIGH (ref 11.5–15.5)
WBC: 23.5 10*3/uL — ABNORMAL HIGH (ref 4.0–10.5)
nRBC: 0 % (ref 0.0–0.2)

## 2022-01-07 LAB — GLUCOSE, CAPILLARY
Glucose-Capillary: 100 mg/dL — ABNORMAL HIGH (ref 70–99)
Glucose-Capillary: 79 mg/dL (ref 70–99)
Glucose-Capillary: 83 mg/dL (ref 70–99)
Glucose-Capillary: 85 mg/dL (ref 70–99)
Glucose-Capillary: 96 mg/dL (ref 70–99)
Glucose-Capillary: 99 mg/dL (ref 70–99)

## 2022-01-07 LAB — BILIRUBIN, FRACTIONATED(TOT/DIR/INDIR)
Bilirubin, Direct: 3.4 mg/dL — ABNORMAL HIGH (ref 0.0–0.2)
Indirect Bilirubin: 2.6 mg/dL — ABNORMAL HIGH (ref 0.3–0.9)
Total Bilirubin: 6 mg/dL — ABNORMAL HIGH (ref 0.3–1.2)

## 2022-01-07 LAB — MAGNESIUM: Magnesium: 2.2 mg/dL (ref 1.7–2.4)

## 2022-01-07 MED ORDER — FUROSEMIDE 10 MG/ML IJ SOLN
80.0000 mg | Freq: Four times a day (QID) | INTRAMUSCULAR | Status: DC
  Administered 2022-01-07 – 2022-01-09 (×8): 80 mg via INTRAVENOUS
  Filled 2022-01-07 (×9): qty 8

## 2022-01-07 MED ORDER — OSMOLITE 1.5 CAL PO LIQD
1000.0000 mL | ORAL | Status: DC
  Administered 2022-01-07: 1000 mL
  Filled 2022-01-07: qty 1000

## 2022-01-07 NOTE — Progress Notes (Signed)
PROGRESS NOTE    Laura [name]  VQQ:[id number] DOB: Jul 13, 1960 DOA: 12/27/2021 PCP: Lauree Chandler, NP    Brief Narrative:   Patient is a 61 years old  with past medical history of symptomatic anemia, hypertension, SVT, chronic back pain, tobacco use disorder patient hospital with progressive weakness and fall while attempting to go to the bathroom.  Patient was on the floor for at least 1 day.  EMS found the patient to be very hypotensive in SVT.  Patient identifies as female and lives in an apartment and is causing assist him with medical care at home.  History of alcohol and smoking.  Patient does have episodes of incontinence at baseline.  In the ED patient was noted to have recurrent SVT with hypotension.  Labs showed hypokalemia at 3.1 and creatinine was elevated at 2.1.  Albumin 2.3.  Ammonia was elevated at 60.  AST ALT mildly elevated.  Lactate was elevated at 4.0.  WBC at 5.5 and platelets at 77.  Chest x-ray was negative for COVID was negative as well.  Patient was then initially admitted to the ICU service for acute alcoholic hepatitis, pancytopenia with possible hepatorenal syndrome.  Patient has been transferred out of the ICU at this time.  Significant Hospital events.  9/9 Admit s/p mechanical fall initial Hgb 5.0 > 2 U pRBC-Vancomycin and Cefepime started 9/11 Hgb 6.8 > 1U pRBC; Bcx 1 NG 2 days-pulled off vancomycin 9/12 1/2 Bcx grew staphylococcus species, possible contaminant 9/13 continued thrombocytopenia, hem onc consulted. Worsening encephalopathy-EEG severe encephalopathy/cefepime toxicity, CT head no acute issues, stopped cefepime switched to CTX 9/14 Increasing WBC-repeat Bcx, restart vancomycin and continue CTX-Bcx Staphylococcus warneri 1/2 likely contaminant, Cortrak placed, Albumin x 2, MAP goal of 80-85 possible hepatorenal 9/15 levophed, albumin and octreotide added for potential hepatorenal syndrome 9/16 NGT placed for N/V 9/17 Nephrology consult, recs: Continue with  biopsy if clinically relevant, continue with rifaximin and Lactulose for hepatic encephalopathy  Assessment and Plan: Principal Problem:   Anemia Active Problems:   Hyperbilirubinemia   Pressure injury of skin   Alcoholic cirrhosis of liver without ascites (HCC)   Alcoholic hepatitis with ascites   AKI (acute kidney injury) ([place])   Thrombocytopenia ([place])   Acute alcoholic hepatitis with possible hepatorenal syndrome Anion Gap Metabolic Acidosis GI on board.  Likely acute alcoholic hepatitis with hepatorenal syndrome.  On midodrine and octreotide.  Status post albumin challenge 9/17.  Minimal ascites on ultrasound 916.  Continue rifaximin and lactulose. Creatinine at 3.3 from 3.2 and 2.9.  Urine output low..  ANA and Anti-mitochondrial labs within normal limits.  Nephrology has started IV Lasix as 80 mg every 6 hourly.  No indication for hemodialysis yet.  Continue to monitor closely.  GI and nephrology on board. Continue sodium bicarb for metabolic acidosis.  Bilirubin is improving.  MELD score of 31 estimated 52% mortality in 3 months.  Patient is positive fluid balance.   Pancytopenia Hematology followed the patient during hospitalization.  Has signed off at this time.  DIC panel negative.  Has thrombocytopenia and mild leukocytosis at this time.  Continue to monitor.  Hemoglobin of 8.8.  Transfuse for hemoglobin less than 7.  Had received 2 units of packed RBC initially.  Acute metabolic encephalopathy secondary to to cefepime toxicity, mild hyperammonemia, multiorgan failure Cefepime discontinued based on EEG results and transitioned to ceftriaxone 9/13, completed on 9/15. CT head without acute issues.  Has waxing and waning confusion.  Received thiamine.  Continue oral lactulose and  rifaximin.  On fentanyl as needed pain due to renal function.   Ileus  N/V Has had some nausea vomiting yesterday and needed NG tube suctioning.  On cortrak tube tube.  We will restart slowly and trickle  feeds.  Aspiration precautions.  Antiemetics and supportive care.    Sepsis 2/2 Urinary Source or Skin Wounds   Blood culture showed Staphylococcus warneri in 1/2 likely contaminant. 7 days of cefepime/ceftriaxone completed on 9/15. Blood culture 9/14 negative to date. As of today, WBC down trending to 23.5 from 23.5< 26. Wound is stable.  Continue wound care.   Supraventricular tachycardia. Magnesium goal more than 2 and potassium goal more than 4.  Improved at this time..   Adult Failure to Thrive  Patient wishes [name] [phone] to make decisions for him and his goddaughter. Family meeting held 9/16.  Palliative care on board for ongoing goals of care discussion.  Deep tissue injury buttocks, perineum, sacrum.  Present on admission -wound care management    DVT prophylaxis: Place and maintain sequential compression device Start: 12/27/21 1918 SCDs Start: 12/27/21 1828   Code Status:     Code Status: Full Code  Disposition: Uncertain at this time.  Status is: Inpatient  Remains inpatient appropriate because: Pending clinical improvement, multiple organ dysfunction, renal failure   Family Communication: No one present at bedside  Consultants:  Critical care  Nephrology   palliative care GI  Procedures:  RBC transfusion Cortrak tube tube insertion NG tube insertion Foley catheter placement. Rectal tube placement. PICC line placement.  Antimicrobials:  Rifaximin  Subjective: Today, patient was seen and examined at bedside.  Patient had nausea vomiting yesterday and was put on intermittent suctioning.  Oriented to time and place.  Denies any pain, shortness of breath fever chills.  Objective: Vitals:   01/07/22 1100 01/07/22 1111 01/07/22 1200 01/07/22 1300  BP: 99/70  105/74 101/87  Pulse: 69  67 65  Resp: 17  15 (!) 21  Temp:  98 F (36.7 C)    TempSrc:  Oral    SpO2: 98%  97% 96%  Weight:      Height:        Intake/Output Summary (Last 24  hours) at 01/07/2022 1356 Last data filed at 01/07/2022 1200 Gross per 24 hour  Intake 2286.38 ml  Output 1629 ml  Net 657.38 ml   Filed Weights   01/05/22 0500 01/06/22 0500 01/07/22 0325  Weight: 78.6 kg 76.7 kg 80.5 kg    Physical Examination: Body mass index is 27.8 kg/m.  General:  Average built, not in obvious distress, older than his stated age, appears ill and deconditioned. HENT:   No scleral pallor or icterus noted. Oral mucosa is moist.  Cortrak tube tube and NG tube in place Chest:  Clear breath sounds.  Diminished breath sounds bilaterally. No crackles or wheezes.  CVS: S1 &S2 heard. No murmur.  Regular rate and rhythm. Abdomen: Soft, nontender with distended abdomen but no obvious tenderness, bowel sounds are heard.  Rectal tube in place, Foley catheter in place Extremities: No cyanosis, clubbing with bilateral lower extremity edema, peripheral pulses are palpable.  Left upper extremity PICC line in place. Psych: Alert, awake and oriented to place and time, CNS:  Moving extremities Skin: Warm and dry.  No rashes noted.  Data Reviewed:   CBC: Recent Labs  Lab 01/04/22 0430 01/04/22 1749 01/05/22 0507 01/06/22 0540 01/07/22 0324  WBC 20.7* 20.4* 26.9*  26.4* 23.8* 23.5*  NEUTROABS  --   --  23.8*  --   --   HGB 8.5* 7.6* 7.5*  7.6* 8.0* 8.8*  HCT 25.7* 21.7* 21.7*  21.6* 23.3* 27.1*  MCV 96.3 93.1 95.2  94.3 96.7 99.3  PLT 53*  53* 55* 54*  56*  54* 59* 65*    Basic Metabolic Panel: Recent Labs  Lab 01/02/22 2224 01/03/22 0440 01/03/22 1745 01/04/22 0852 01/05/22 0507 01/06/22 0540 01/06/22 1446 01/07/22 0324  NA 133* 132* 134*  --  133* 135 136 135  K 3.0* 3.1* 4.3  --  4.0 3.6 3.8 3.7  CL 96* 97* 102  --  104 105 107 106  CO2 22 23 15*  --  17* 17* 16* 16*  GLUCOSE 197* 219* 117*  --  99 146* 130* 122*  BUN 46* 47* 56*  --  64* 69* 69* 75*  CREATININE 1.89* 2.07* 2.59*  --  2.99* 3.25* 3.27* 3.30*  CALCIUM 7.6* 7.9* 9.2  --  8.8* 8.7*  8.7* 8.7*  MG 1.5* 1.7  --  1.9 1.9 2.3  --   --   PHOS  --  1.8*  --  3.5 3.7 3.8  --  3.7    Liver Function Tests: Recent Labs  Lab 01/01/22 0256 01/01/22 0948 01/02/22 0345 01/03/22 0440 01/04/22 0430 01/05/22 0507 01/06/22 0540 01/07/22 0324  AST 46*  --  44* 32  --   --   --   --   ALT 25  --  22 19  --   --   --   --   ALKPHOS 105  --  109 107  --   --   --   --   BILITOT 13.0*   < > 12.0* 8.9* 9.0* 7.8* 6.9* 6.0*  PROT 4.8*  --  4.9* 4.6*  --   --   --   --   ALBUMIN 1.5*  --  2.4* 2.2*  --  2.4* 2.3* 2.2*   < > = values in this interval not displayed.     Radiology Studies: DG Abd Portable 1V  Result Date: 01/07/2022 CLINICAL DATA:  Abdominal stent shin. EXAM: PORTABLE ABDOMEN - 1 VIEW COMPARISON:  01/03/2022 FINDINGS: Feeding tube tip is in the mid stomach. NG tube tip is in the stomach with tip overlying the cardia. Proximal side port of the NG tube is well below the GE junction. Interval decrease in gaseous bowel distension seen on the prior study with no gaseous dilatation currently to suggest bowel obstruction. IMPRESSION: 1. Feeding tube tip is in the mid stomach. 2. NG tube tip is in the stomach. 3. No gaseous bowel dilatation to suggest obstruction. Electronically Signed   By: Kennith Center M.D.   On: 01/07/2022 05:55      LOS: 11 days    Joycelyn Das, MD [facility] Available via Epic secure chat 7am-7pm After these hours, please refer to coverage provider listed on [email or web address] 01/07/2022, 1:56 PM

## 2022-01-07 NOTE — Progress Notes (Signed)
Subjective:  fairly stable-  -  minimal UOP recorded but actually UOP is trending up -  crt up-  MS back down some-  did deny SOB or discomfort    Objective Vital signs in last 24 hours: Vitals:   01/07/22 0300 01/07/22 0325 01/07/22 0332 01/07/22 0721  BP: 106/70     Pulse: 74     Resp: 16     Temp:   98.4 F (36.9 C) 98.1 F (36.7 C)  TempSrc:   Axillary Axillary  SpO2: 97%     Weight:  80.5 kg    Height:       Weight change: 3.8 kg  Intake/Output Summary (Last 24 hours) at 01/07/2022 0846 Last data filed at 01/07/2022 0700 Gross per 24 hour  Intake 2306.71 ml  Output 1279 ml  Net 1027.71 ml    Assessment/ Plan: Pt is a 61 y.o. yo adult who was admitted on 12/27/2021 with  weakness -  found to have pancytopenia in the setting of known liver dz-  also has developed AKI  Assessment/Plan: 1. Renal-  crt 0.5 in June of 2023-  crt on 9/9 was 2.1---- down to 1.3 on 9/12 but then started to rise -  up over 3 today -  had some hypotension around the time of crt rise.  Differential includes ATN vs HRS.  Hemodynamics have improved but UOP still low.  Urine sodium 11.  Receiving max medical therapy for HRS-  on  midodrine, octreotide -  crt still rising but no absolute indications for HD.  Not sure pt can comprehend the decisions that face him at this time 2. HTN/volume-   weaned off of levo--- on midodrine.  By Is and Os is 19 liters positive-  seems like did respond a little to lasix yest-  will start some scheduled 3. Anemia-  received many transfusions since admit-  heme previously involved considering BMBx but now have signed off 4. Liver dz-  bili is improving -  unclear if actually has cirrhosis-  GI says yes and put him on lactulose and rifaximine-  meld score 31 est 52% mortality in 3 mos  5. Metabolic acidosis-  stable with oral sodium bicarb   [name]    Labs: Basic Metabolic Panel: Recent Labs  Lab 01/05/22 0507 01/06/22 0540 01/06/22 1446 01/07/22 0324  NA  133* 135 136 135  K 4.0 3.6 3.8 3.7  CL 104 105 107 106  CO2 17* 17* 16* 16*  GLUCOSE 99 146* 130* 122*  BUN 64* 69* 69* 75*  CREATININE 2.99* 3.25* 3.27* 3.30*  CALCIUM 8.8* 8.7* 8.7* 8.7*  PHOS 3.7 3.8  --  3.7   Liver Function Tests: Recent Labs  Lab 01/01/22 0256 01/01/22 0948 01/02/22 0345 01/03/22 0440 01/04/22 0430 01/05/22 0507 01/06/22 0540 01/07/22 0324  AST 46*  --  44* 32  --   --   --   --   ALT 25  --  22 19  --   --   --   --   ALKPHOS 105  --  109 107  --   --   --   --   BILITOT 13.0*   < > 12.0* 8.9*   < > 7.8* 6.9* 6.0*  PROT 4.8*  --  4.9* 4.6*  --   --   --   --   ALBUMIN 1.5*  --  2.4* 2.2*  --  2.4* 2.3* 2.2*   < > =  values in this interval not displayed.   No results for input(s): "LIPASE", "AMYLASE" in the last 168 hours. Recent Labs  Lab 12/31/21 0932  AMMONIA 45*   CBC: Recent Labs  Lab 01/04/22 0430 01/04/22 1749 01/05/22 0507 01/06/22 0540 01/07/22 0324  WBC 20.7* 20.4* 26.9*  26.4* 23.8* 23.5*  NEUTROABS  --   --  23.8*  --   --   HGB 8.5* 7.6* 7.5*  7.6* 8.0* 8.8*  HCT 25.7* 21.7* 21.7*  21.6* 23.3* 27.1*  MCV 96.3 93.1 95.2  94.3 96.7 99.3  PLT 53*  53* 55* 54*  56*  54* 59* 65*   Cardiac Enzymes: No results for input(s): "CKTOTAL", "CKMB", "CKMBINDEX", "TROPONINI" in the last 168 hours. CBG: Recent Labs  Lab 01/06/22 1933 01/06/22 1953 01/06/22 2353 01/07/22 0330 01/07/22 0720  GLUCAP 122* 112* 122* 100* 83    Iron Studies: No results for input(s): "IRON", "TIBC", "TRANSFERRIN", "FERRITIN" in the last 72 hours. Studies/Results: DG Abd Portable 1V  Result Date: 01/07/2022 CLINICAL DATA:  Abdominal stent shin. EXAM: PORTABLE ABDOMEN - 1 VIEW COMPARISON:  01/03/2022 FINDINGS: Feeding tube tip is in the mid stomach. NG tube tip is in the stomach with tip overlying the cardia. Proximal side port of the NG tube is well below the GE junction. Interval decrease in gaseous bowel distension seen on the prior study with  no gaseous dilatation currently to suggest bowel obstruction. IMPRESSION: 1. Feeding tube tip is in the mid stomach. 2. NG tube tip is in the stomach. 3. No gaseous bowel dilatation to suggest obstruction. Electronically Signed   By: Kennith Center M.D.   On: 01/07/2022 05:55   Medications: Infusions:  sodium chloride 10 mL/hr at 01/07/22 0700   feeding supplement (OSMOLITE 1.5 CAL) Stopped (01/07/22 0200)   octreotide (SANDOSTATIN) 500 mcg in sodium chloride 0.9 % 250 mL (2 mcg/mL) infusion 50 mcg/hr (01/07/22 0700)    Scheduled Medications:  Chlorhexidine Gluconate Cloth  6 each Topical Q0600   feeding supplement (PROSource TF20)  60 mL Per Tube BID   folic acid  1 mg Per Tube Daily   Gerhardt's butt cream   Topical BID   lactulose  10 g Per Tube BID   leptospermum manuka honey  1 Application Topical Daily   midodrine  15 mg Per Tube Q8H   mouth rinse  15 mL Mouth Rinse 4 times per day   pantoprazole  40 mg Per Tube Daily   rifaximin  550 mg Per Tube TID   sodium bicarbonate  1,300 mg Per Tube BID   sodium chloride flush  10-40 mL Intracatheter Q12H   thiamine  100 mg Per Tube Daily    have reviewed scheduled and prn medications.  Physical Exam: General: seems less clear today -  Heart: RRR Lungs: mostly clear Abdomen: soft, non tender Extremities: min edema -  legs are less tender    01/07/2022,8:46 AM  LOS: 11 days

## 2022-01-07 NOTE — Progress Notes (Signed)
Daily Rounding Note  01/07/2022, 2:53 PM  LOS: 11 days   SUBJECTIVE:   Chief complaint: Acute liver injury, hepatitis.  Patient more alert today.  Denies abdominal pain.  Denies nausea.  Tolerating tube feeds.  OBJECTIVE:         Vital signs in last 24 hours:    Temp:  [97.5 F (36.4 C)-98.4 F (36.9 C)] 98 F (36.7 C) (09/20 1111) Pulse Rate:  [63-76] 70 (09/20 1400) Resp:  [13-21] 15 (09/20 1400) BP: (99-132)/(61-88) 110/81 (09/20 1400) SpO2:  [95 %-100 %] 97 % (09/20 1400) Weight:  [80.5 kg] 80.5 kg (09/20 0325) Last BM Date : 01/07/22 Filed Weights   01/05/22 0500 01/06/22 0500 01/07/22 0325  Weight: 78.6 kg 76.7 kg 80.5 kg   General: Looks severely chronically and acutely ill.  Alert.  Difficult to understand her speech. Heart: RRR. Chest: No labored breathing or cough.  Overall decreased breath sounds but clear. Abdomen: Soft without tenderness.  No distention.  Bowel sounds active. Extremities: Minor lower extremity edema. Neuro/Psych: Slow to follow commands but for the most part does follow commands.  Moves all 4 limbs.  Speech is very difficult to understand.  She tells me that she is in the hospital.  Intake/Output from previous day: 09/19 0701 - 09/20 0700 In: 3244.4 [I.V.:1440.8; NG/GT:1237.7; IV Piggyback:566] Out: 1489 [Urine:339; Emesis/NG output:550; Stool:600]  Intake/Output this shift: Total I/O In: 500.6 [I.V.:174.6; NG/GT:326] Out: 380 [Urine:50; Stool:330]  Lab Results: Recent Labs    01/05/22 0507 01/06/22 0540 01/07/22 0324  WBC 26.9*  26.4* 23.8* 23.5*  HGB 7.5*  7.6* 8.0* 8.8*  HCT 21.7*  21.6* 23.3* 27.1*  PLT 54*  56*  54* 59* 65*   BMET Recent Labs    01/06/22 0540 01/06/22 1446 01/07/22 0324  NA 135 136 135  K 3.6 3.8 3.7  CL 105 107 106  CO2 17* 16* 16*  GLUCOSE 146* 130* 122*  BUN 69* 69* 75*  CREATININE 3.25* 3.27* 3.30*  CALCIUM 8.7* 8.7* 8.7*    LFT Recent Labs    01/05/22 0507 01/06/22 0540 01/07/22 0324  ALBUMIN 2.4* 2.3* 2.2*  BILITOT 7.8* 6.9* 6.0*  BILIDIR 4.6* 4.0* 3.4*  IBILI 3.2* 2.9* 2.6*   PT/INR Recent Labs    01/05/22 0507  LABPROT 17.7*  INR 1.5*   Hepatitis Panel No results for input(s): "HEPBSAG", "HCVAB", "HEPAIGM", "HEPBIGM" in the last 72 hours.  Studies/Results: DG Abd Portable 1V  Result Date: 01/07/2022 CLINICAL DATA:  Abdominal stent shin. EXAM: PORTABLE ABDOMEN - 1 VIEW COMPARISON:  01/03/2022 FINDINGS: Feeding tube tip is in the mid stomach. NG tube tip is in the stomach with tip overlying the cardia. Proximal side port of the NG tube is well below the GE junction. Interval decrease in gaseous bowel distension seen on the prior study with no gaseous dilatation currently to suggest bowel obstruction. IMPRESSION: 1. Feeding tube tip is in the mid stomach. 2. NG tube tip is in the stomach. 3. No gaseous bowel dilatation to suggest obstruction. Electronically Signed   By: Kennith Center M.D.   On: 01/07/2022 05:55    Scheduled Meds:  Chlorhexidine Gluconate Cloth  6 each Topical Q0600   feeding supplement (PROSource TF20)  60 mL Per Tube BID   folic acid  1 mg Per Tube Daily   furosemide  80 mg Intravenous Q6H   Gerhardt's butt cream   Topical BID   lactulose  10  g Per Tube BID   leptospermum manuka honey  1 Application Topical Daily   midodrine  15 mg Per Tube Q8H   mouth rinse  15 mL Mouth Rinse 4 times per day   pantoprazole  40 mg Per Tube Daily   rifaximin  550 mg Per Tube TID   sodium bicarbonate  1,300 mg Per Tube BID   sodium chloride flush  10-40 mL Intracatheter Q12H   thiamine  100 mg Per Tube Daily   Continuous Infusions:  sodium chloride 10 mL/hr at 01/07/22 1200   feeding supplement (OSMOLITE 1.5 CAL) 22 mL/hr at 01/07/22 1200   octreotide (SANDOSTATIN) 500 mcg in sodium chloride 0.9 % 250 mL (2 mcg/mL) infusion 50 mcg/hr (01/07/22 1340)   PRN Meds:.docusate, fentaNYL  (SUBLIMAZE) injection, Gerhardt's butt cream, ondansetron (ZOFRAN) IV, mouth rinse, polyethylene glycol, sodium chloride flush   MELD 3.0: 32 at 01/07/2022  3:24 AM MELD-Na: 30 at 01/07/2022  3:24 AM   ASSESMENT:   Hepatitis.  Presumed alcohol related.  Imaging confirms hepatomegaly, hepatic steatosis but no definitive cirrhosis though certainly at risk of underlying cirrhosis. Transaminases trending improved.  HRS.  Nephrology involved.  On octreotide drip, midodrine via tube.  Worsening GFR.  Oliguric.    Encephalopathy.  Multifactorial.   Lactulose, rifaximin in place.  Ammonia level 60... 45.  Pancytopenia.  [place] anemia.  Longstanding. Transfusions thus far consist of 4 PRBCs  FOBT positive stool though no overt melena, BPR, bloody/CGE emesis.      PLAN   GI has no plans for pursuing liver biopsy as it is unlikely to change management.  If other providers feel liver biopsy warranted, will defer to them.  GI signing off.  Available if needed.    Azucena Freed  01/07/2022, 2:53 PM Phone [phone]

## 2022-01-07 NOTE — Progress Notes (Signed)
Notified Pokhrel MD (attending) about frequent runs of SVT into the 150s. MD aware. No new orders received. Also inquired about K redraw d/t pt receiving lasix twice today. MD confirmed to draw labs in the AM. BP stable. Pt denies chest pain.

## 2022-01-07 NOTE — Progress Notes (Signed)
Nutrition Follow-up  DOCUMENTATION CODES:   Not applicable  INTERVENTION:   Tube feeds via Cortrak tube: Recommend advancing to goal as tolerated; goal Osmolite 1.5 at 50 mL/hr (1200 mL/day) 60 mL ProSource TF20 - BID Provides 1960 kcal, 115 grams of protein, and 914 mL free water daily.   NUTRITION DIAGNOSIS:   Increased nutrient needs related to acute illness, wound healing as evidenced by estimated needs. - Ongoing   GOAL:   Patient will meet greater than or equal to 90% of their needs - Ongoing  MONITOR:   PO intake, Supplement acceptance, Diet advancement, Labs, Weight trends, Skin  REASON FOR ASSESSMENT:   Consult Enteral/tube feeding initiation and management  ASSESSMENT:   Pt admitted with weakness, septic shock and SVT. PMH significant for chronic anemia and hx EtOH use  9/14 - Cortrak placed (tip gastric) 9/16 - NGT placed to LIWS 9/19 - rectal tube placed  Pt laying in bed at time of RD visit. Does not answer RD questions. TF hanging but was not running at goal. Discussed with RN. RN reports that pt has vomiting yesterday and that TF were held and restarted today. Reports pt having multiple BM, but pt is also on lactulose.   Medications reviewed and include: Folic acid, Lasix, Lactulose, Protonix, Rifaximin, Thiamine Labs reviewed: BUN 75, Creatinine 3.30, 24 hr CBG 79-122  Diet Order:   Diet Order             Diet NPO time specified  Diet effective now                   EDUCATION NEEDS:   No education needs have been identified at this time  Skin:  Skin Assessment: Skin Integrity Issues: Skin Integrity Issues:: DTI DTI: bilat buttocks  Last BM:  9/19  via rectal tube  Height:   Ht Readings from Last 1 Encounters:  01/01/22 5\' 7"  (1.702 m)    Weight:   Wt Readings from Last 1 Encounters:  01/07/22 80.5 kg    BMI:  Body mass index is 27.8 kg/m.  Estimated Nutritional Needs:   Kcal:  1800-2000  Protein:  100-115g  Fluid:   >/=1.8L    Hermina Barters RD, LDN Clinical Dietitian See [facility] for contact information.

## 2022-01-08 DIAGNOSIS — D508 Other iron deficiency anemias: Secondary | ICD-10-CM | POA: Diagnosis not present

## 2022-01-08 DIAGNOSIS — N179 Acute kidney failure, unspecified: Secondary | ICD-10-CM | POA: Diagnosis not present

## 2022-01-08 DIAGNOSIS — K767 Hepatorenal syndrome: Secondary | ICD-10-CM

## 2022-01-08 DIAGNOSIS — K7011 Alcoholic hepatitis with ascites: Secondary | ICD-10-CM | POA: Diagnosis not present

## 2022-01-08 LAB — CBC
HCT: 23.7 % — ABNORMAL LOW (ref 36.0–46.0)
Hemoglobin: 7.8 g/dL — ABNORMAL LOW (ref 12.0–15.0)
MCH: 33.2 pg (ref 26.0–34.0)
MCHC: 32.9 g/dL (ref 30.0–36.0)
MCV: 100.9 fL — ABNORMAL HIGH (ref 80.0–100.0)
Platelets: 53 10*3/uL — ABNORMAL LOW (ref 150–400)
RBC: 2.35 MIL/uL — ABNORMAL LOW (ref 3.87–5.11)
RDW: 27.9 % — ABNORMAL HIGH (ref 11.5–15.5)
WBC: 18.6 10*3/uL — ABNORMAL HIGH (ref 4.0–10.5)
nRBC: 0 % (ref 0.0–0.2)

## 2022-01-08 LAB — COMPREHENSIVE METABOLIC PANEL
ALT: 20 U/L (ref 0–44)
AST: 31 U/L (ref 15–41)
Albumin: 2.2 g/dL — ABNORMAL LOW (ref 3.5–5.0)
Alkaline Phosphatase: 111 U/L (ref 38–126)
Anion gap: 12 (ref 5–15)
BUN: 81 mg/dL — ABNORMAL HIGH (ref 6–20)
CO2: 17 mmol/L — ABNORMAL LOW (ref 22–32)
Calcium: 9.2 mg/dL (ref 8.9–10.3)
Chloride: 105 mmol/L (ref 98–111)
Creatinine, Ser: 3.57 mg/dL — ABNORMAL HIGH (ref 0.44–1.00)
GFR, Estimated: 14 mL/min — ABNORMAL LOW (ref >60)
Glucose, Bld: 112 mg/dL — ABNORMAL HIGH (ref 70–99)
Potassium: 3.6 mmol/L (ref 3.5–5.1)
Sodium: 134 mmol/L — ABNORMAL LOW (ref 135–145)
Total Bilirubin: 5.3 mg/dL — ABNORMAL HIGH (ref 0.3–1.2)
Total Protein: 5.2 g/dL — ABNORMAL LOW (ref 6.5–8.1)

## 2022-01-08 LAB — GLUCOSE, CAPILLARY
Glucose-Capillary: 105 mg/dL — ABNORMAL HIGH (ref 70–99)
Glucose-Capillary: 101 mg/dL — ABNORMAL HIGH (ref 70–99)
Glucose-Capillary: 114 mg/dL — ABNORMAL HIGH (ref 70–99)
Glucose-Capillary: 114 mg/dL — ABNORMAL HIGH (ref 70–99)
Glucose-Capillary: 126 mg/dL — ABNORMAL HIGH (ref 70–99)

## 2022-01-08 LAB — ANTINUCLEAR ANTIBODIES, IFA: ANA Ab, IFA: NEGATIVE

## 2022-01-08 LAB — PHOSPHORUS: Phosphorus: 4.3 mg/dL (ref 2.5–4.6)

## 2022-01-08 LAB — MAGNESIUM: Magnesium: 2.4 mg/dL (ref 1.7–2.4)

## 2022-01-08 MED ORDER — ADULT MULTIVITAMIN LIQUID CH
15.0000 mL | Freq: Every day | ORAL | Status: DC
  Administered 2022-01-08 – 2022-01-09 (×2): 15 mL
  Filled 2022-01-08 (×2): qty 15

## 2022-01-08 MED ORDER — DARBEPOETIN ALFA 200 MCG/0.4ML IJ SOSY
200.0000 ug | PREFILLED_SYRINGE | INTRAMUSCULAR | Status: DC
  Administered 2022-01-08 – 2022-01-15 (×2): 200 ug via SUBCUTANEOUS
  Filled 2022-01-08 (×3): qty 0.4

## 2022-01-08 MED ORDER — METOPROLOL TARTRATE 12.5 MG HALF TABLET
12.5000 mg | ORAL_TABLET | Freq: Two times a day (BID) | ORAL | Status: DC
  Administered 2022-01-08 – 2022-01-09 (×3): 12.5 mg
  Filled 2022-01-08 (×4): qty 1

## 2022-01-08 MED ORDER — POTASSIUM CHLORIDE 20 MEQ PO PACK
40.0000 meq | PACK | Freq: Once | ORAL | Status: AC
  Administered 2022-01-08: 40 meq
  Filled 2022-01-08: qty 2

## 2022-01-08 MED ORDER — VITAMIN D 25 MCG (1000 UNIT) PO TABS
2000.0000 [IU] | ORAL_TABLET | Freq: Every day | ORAL | Status: DC
  Administered 2022-01-08 – 2022-01-14 (×7): 2000 [IU]
  Filled 2022-01-08 (×8): qty 2

## 2022-01-08 NOTE — Progress Notes (Signed)
Subjective:  fairly stable- moved out of ICU -  UOP 300-400 per day  -  crt cont to rise slowly -  MS pretty good-  no c/o's-  wife at bedside -    Objective Vital signs in last 24 hours: Vitals:   01/07/22 2255 01/08/22 0500 01/08/22 0700 01/08/22 0900  BP: 118/87 (!) 106/58 110/79 121/85  Pulse: 63  69 68  Resp: 18 14 14 14   Temp: (!) 97.2 F (36.2 C) (!) 97.2 F (36.2 C)  98.3 F (36.8 C)  TempSrc: Axillary Axillary  Oral  SpO2: 98% 99% 99% 99%  Weight:      Height:       Weight change:   Intake/Output Summary (Last 24 hours) at 01/08/2022 01/10/2022 Last data filed at 01/08/2022 0600 Gross per 24 hour  Intake 1284.9 ml  Output 555 ml  Net 729.9 ml    Assessment/ Plan: Pt is a 61 y.o. yo adult who was admitted on 12/27/2021 with  weakness -  found to have pancytopenia in the setting of known liver dz-  also has developed AKI  Assessment/Plan: 1. Renal-  crt 0.5 in June of 2023-  crt on 9/9 was 2.1---- down to 1.3 on 9/12 but then started to rise slowly  -  up over 3.5 today -  had some hypotension around the time of crt rise.  Differential includes ATN vs HRS.  Hemodynamics have improved but UOP still low.  Urine sodium 11.  Receiving max medical therapy for HRS-  on  midodrine, octreotide -  crt still rising but no absolute indications for HD.  Traditionally dialysis is not recommended for HRS because prognosis is so poor-  thankfully have not had to make that decision yet 2. HTN/volume-   weaned off of levo--- on midodrine.  By Is and Os is 19 liters positive-  seems like did respond a little to lasix yest-  have started some scheduled 3. Anemia-  received many transfusions since admit-  heme previously involved considering BMBx but now have signed off-- will give ESA 4. Liver dz-  bili is improving -  unclear if actually has cirrhosis-  GI says yes and put him on lactulose and rifaximine-  meld score 31 est 52% mortality in 3 mos  5. Metabolic acidosis-  stable with oral sodium bicarb    11/12    Labs: Basic Metabolic Panel: Recent Labs  Lab 01/06/22 0540 01/06/22 1446 01/07/22 0324 01/07/22 1855 01/08/22 0531  NA 135   < > 135 135 134*  K 3.6   < > 3.7 3.7 3.6  CL 105   < > 106 110 105  CO2 17*   < > 16* 14* 17*  GLUCOSE 146*   < > 122* 98 112*  BUN 69*   < > 75* 76* 81*  CREATININE 3.25*   < > 3.30* 3.38* 3.57*  CALCIUM 8.7*   < > 8.7* 8.4* 9.2  PHOS 3.8  --  3.7  --  4.3   < > = values in this interval not displayed.   Liver Function Tests: Recent Labs  Lab 01/02/22 0345 01/03/22 0440 01/04/22 0430 01/06/22 0540 01/07/22 0324 01/08/22 0531  AST 44* 32  --   --   --  31  ALT 22 19  --   --   --  20  ALKPHOS 109 107  --   --   --  111  BILITOT 12.0* 8.9*   < >  6.9* 6.0* 5.3*  PROT 4.9* 4.6*  --   --   --  5.2*  ALBUMIN 2.4* 2.2*   < > 2.3* 2.2* 2.2*   < > = values in this interval not displayed.   No results for input(s): "LIPASE", "AMYLASE" in the last 168 hours. No results for input(s): "AMMONIA" in the last 168 hours.  CBC: Recent Labs  Lab 01/04/22 1749 01/05/22 0507 01/06/22 0540 01/07/22 0324 01/08/22 0531  WBC 20.4* 26.9*  26.4* 23.8* 23.5* 18.6*  NEUTROABS  --  23.8*  --   --   --   HGB 7.6* 7.5*  7.6* 8.0* 8.8* 7.8*  HCT 21.7* 21.7*  21.6* 23.3* 27.1* 23.7*  MCV 93.1 95.2  94.3 96.7 99.3 100.9*  PLT 55* 54*  56*  54* 59* 65* 53*   Cardiac Enzymes: No results for input(s): "CKTOTAL", "CKMB", "CKMBINDEX", "TROPONINI" in the last 168 hours. CBG: Recent Labs  Lab 01/07/22 1548 01/07/22 2009 01/07/22 2357 01/08/22 0456 01/08/22 0900  GLUCAP 99 85 96 105* 101*    Iron Studies: No results for input(s): "IRON", "TIBC", "TRANSFERRIN", "FERRITIN" in the last 72 hours. Studies/Results: DG Abd Portable 1V  Result Date: 01/07/2022 CLINICAL DATA:  Abdominal stent shin. EXAM: PORTABLE ABDOMEN - 1 VIEW COMPARISON:  01/03/2022 FINDINGS: Feeding tube tip is in the mid stomach. NG tube tip is in the stomach  with tip overlying the cardia. Proximal side port of the NG tube is well below the GE junction. Interval decrease in gaseous bowel distension seen on the prior study with no gaseous dilatation currently to suggest bowel obstruction. IMPRESSION: 1. Feeding tube tip is in the mid stomach. 2. NG tube tip is in the stomach. 3. No gaseous bowel dilatation to suggest obstruction. Electronically Signed   By: Misty Stanley M.D.   On: 01/07/2022 05:55   Medications: Infusions:  sodium chloride 10 mL/hr at 01/07/22 2000   feeding supplement (OSMOLITE 1.5 CAL) 22 mL/hr at 01/07/22 2000   octreotide (SANDOSTATIN) 500 mcg in sodium chloride 0.9 % 250 mL (2 mcg/mL) infusion 50 mcg/hr (01/08/22 0038)    Scheduled Medications:  Chlorhexidine Gluconate Cloth  6 each Topical Q0600   feeding supplement (PROSource TF20)  60 mL Per Tube BID   folic acid  1 mg Per Tube Daily   furosemide  80 mg Intravenous Q6H   Gerhardt's butt cream   Topical BID   lactulose  10 g Per Tube BID   leptospermum manuka honey  1 Application Topical Daily   midodrine  15 mg Per Tube Q8H   mouth rinse  15 mL Mouth Rinse 4 times per day   pantoprazole  40 mg Per Tube Daily   rifaximin  550 mg Per Tube TID   sodium bicarbonate  1,300 mg Per Tube BID   sodium chloride flush  10-40 mL Intracatheter Q12H   thiamine  100 mg Per Tube Daily    have reviewed scheduled and prn medications.  Physical Exam: General: alert, no c/o's-  pretty debilitated  Heart: RRR Lungs: mostly clear Abdomen: soft, non tender Extremities: dep pitting edema     01/08/2022,9:39 AM  LOS: 12 days

## 2022-01-08 NOTE — Progress Notes (Signed)
Pt has high HR at 115-120's, asymptomatic. MD was notified and low dose of Metoprolol PO was given. Current HR now is below 90's.  01/08/22 1349  Assess: MEWS Score  Temp 98.6 F (37 C)  BP 106/70  MAP (mmHg) 81  Pulse Rate (!) 122  Resp 18  SpO2 95 %  O2 Device Room Air  Assess: MEWS Score  MEWS Temp 0  MEWS Systolic 0  MEWS Pulse 2  MEWS RR 0  MEWS LOC 0  MEWS Score 2  MEWS Score Color Yellow  Assess: if the MEWS score is Yellow or Red  Were vital signs taken at a resting state? Yes  Focused Assessment No change from prior assessment  Does the patient meet 2 or more of the SIRS criteria? No  MEWS guidelines implemented *See Row Information* No, vital signs rechecked  Notify: Charge Nurse/RN  Name of Charge Nurse/RN Notified Sam  Date Charge Nurse/RN Notified 01/08/22  Time Charge Nurse/RN Notified 4  Notify: Provider  Provider Name/Title Pohhrel MD  Date Provider Notified 01/08/22  Time Provider Notified 1400  Method of Notification Page  Notification Reason Other (Comment) (Yellow MES, high HR)  Provider response See new orders (low dose of Metoprolol PO)  Date of Provider Response 01/08/22  Time of Provider Response 1420  Assess: SIRS CRITERIA  SIRS Temperature  0  SIRS Pulse 1  SIRS Respirations  0  SIRS WBC 1  SIRS Score Sum  2

## 2022-01-08 NOTE — Progress Notes (Signed)
Dressing changed was done today. RN thinks pt would benefit for an airbed to prevent progression of sacral wound. Pt is not mobile enough for mobility, and been refusing repositioning. MD was notified.

## 2022-01-08 NOTE — Progress Notes (Signed)
PT Cancellation Note  Patient Details Name: Laura [name] MRN: [id number] DOB: 1961-02-28   Cancelled Treatment:    Reason Eval/Treat Not Completed: Patient declined, no reason specifiedPatient refusing OOB mobility with no reason specified initially but with conversation stating "I can't because I'm tired after leaving and going home earlier". Despite encouragement, patient continued to refuse.   Karrah Mangini A. Gilford Rile PT, DPT Acute Rehabilitation Services Office [phone]    Linna Hoff 01/08/2022, 3:54 PM

## 2022-01-08 NOTE — Progress Notes (Signed)
PROGRESS NOTE    Laura [name]  EQA:[id number] DOB: 04-07-1961 DOA: 12/27/2021 PCP: Sharon Seller, NP    Brief Narrative:  Patient is a 61 years old  with past medical history of symptomatic anemia, hypertension, SVT, chronic back pain, tobacco use disorder patient hospital with progressive weakness and fall while attempting to go to the bathroom.  Patient was on the floor for at least 1 day.  EMS found the patient to be very hypotensive in SVT.  Patient identifies as female and lives in an apartment and cousin assist him with medical care at home.  History of alcohol and smoking.  Patient does have episodes of incontinence at baseline.  In the ED, patient was noted to have recurrent SVT with hypotension.  Labs showed hypokalemia at 3.1 and creatinine was elevated at 2.1.  Albumin 2.3.  Ammonia was elevated at 60.  AST ALT mildly elevated.  Lactate was elevated at 4.0.  WBC at 5.5 and platelets at 77.  Chest x-ray was negative for COVID was negative as well.  Patient was then initially admitted to the ICU service for acute alcoholic hepatitis, pancytopenia with possible hepatorenal syndrome.  Patient was then transferred out of the ICU.  Significant Hospital events.  9/9 Admit s/p mechanical fall initial Hgb 5.0 > 2 U pRBC-Vancomycin and Cefepime started 9/11 Hgb 6.8 > 1U pRBC; Bcx 1 NG 2 days-pulled off vancomycin 9/12 1/2 Bcx grew staphylococcus species, possible contaminant 9/13 continued thrombocytopenia, hem onc consulted. Worsening encephalopathy-EEG severe encephalopathy/cefepime toxicity, CT head no acute issues, stopped cefepime switched to CTX 9/14 Increasing WBC-repeat Bcx, restart vancomycin and continue CTX-Bcx Staphylococcus warneri 1/2 likely contaminant, Cortrak placed, Albumin x 2, MAP goal of 80-85 possible hepatorenal 9/15 levophed, albumin and octreotide added for potential hepatorenal syndrome 9/16 NGT placed for N/V 9/17 Nephrology consult, recs: Continue with biopsy if  clinically relevant, continue with rifaximin and Lactulose for hepatic encephalopathy 9/21.  GI has signed off.  No recommendation for liver biopsy.  Assessment and Plan: Principal Problem:   Anemia Active Problems:   Hyperbilirubinemia   Pressure injury of skin   Alcoholic cirrhosis of liver without ascites (HCC)   Alcoholic hepatitis with ascites   AKI (acute kidney injury) (HCC)   Thrombocytopenia (HCC)   Acute alcoholic hepatitis with possible hepatorenal syndrome Anion Gap Metabolic Acidosis GI followed the patient during hospitalization.  Likely acute alcoholic hepatitis with hepatorenal syndrome.  On midodrine and octreotide.  Status post albumin challenge 9/17.  Minimal ascites on ultrasound 9/16.  Continue rifaximin and lactulose. Creatinine at 3.3 from 3.3, 3.2 and 2.9.  On high-dose Lasix at this time with slightly better output.  ANA and Anti-mitochondrial labs within normal limits.  Nephrology on board and currently receiving IV Lasix as 80 mg every 6 hourly.  No indication for hemodialysis yet.  On sodium bicarb for metabolic acidosis.  Bilirubin is improving.  MELD score of 31 estimated 52% mortality in 3 months.  Patient is positive fluid balance for 97989 mL.Marland Kitchen   Pancytopenia Hematology followed the patient during hospitalization.  Has signed off at this time.  DIC panel negative.  Has thrombocytopenia and mild leukocytosis at this time.  Continue to monitor.  Hemoglobin of 7.8  Transfuse for hemoglobin less than 7.  Had received 2 units of packed RBC initially.  Acute metabolic encephalopathy secondary to to cefepime toxicity, mild hyperammonemia, multiorgan failure Cefepime discontinued based on EEG results and transitioned to ceftriaxone 9/13, completed on 9/15. CT head without acute issues.  Received thiamine.  Continue oral lactulose and rifaximin.  On fentanyl as needed pain due to renal function.  Mentation continues to improve.   Ileus  N/V Has had some nausea  vomiting  needed NG tube suctioning.  On cortrak tube tube and has been restarted on tube feeding..  We will continue antiemetics and supportive care.    Sepsis 2/2 Urinary Source or Skin Wounds   Blood culture showed Staphylococcus warneri in 1/2 likely contaminant.  Patient received 7 days of cefepime/ceftriaxone completed on 9/15. Blood culture 9/14 negative to date.  Trending down leukocytosis.  Supraventricular tachycardia. Magnesium goal more than 2 and potassium goal more than 4.  Has episodes of SVT during hospitalization.   Adult Failure to Thrive  Patient wishes [name] [phone] to make decisions for him and his goddaughter. Family meeting held 9/16.  Palliative care on board for ongoing goals of care discussion.  Deep tissue injury buttocks, perineum, sacrum.  Present on admission -wound care management    DVT prophylaxis: Place and maintain sequential compression device Start: 12/27/21 1918 SCDs Start: 12/27/21 1828   Code Status:     Code Status: Full Code  Disposition: Uncertain at this time.  Status is: Inpatient  Remains inpatient appropriate because: Pending clinical improvement, multiple organ dysfunction, renal failure   Family Communication: Spoke with the patient's wife at bedside.  Consultants:  Critical care  Nephrology   palliative care GI  Procedures:  RBC transfusion Cortrak tube tube insertion NG tube insertion Foley catheter placement. Rectal tube placement. PICC line placement.  Antimicrobials:  Rifaximin  Subjective: Today, patient was seen and examined at bedside.  Patient's wife at bedside.  Denies any nausea or vomiting.  Has been having bowel movements.  Denies dyspnea.  Objective: Vitals:   01/07/22 2255 01/08/22 0500 01/08/22 0700 01/08/22 0900  BP: 118/87 (!) 106/58 110/79 121/85  Pulse: 63  69 68  Resp: 18 14 14 14   Temp: (!) 97.2 F (36.2 C) (!) 97.2 F (36.2 C)  98.3 F (36.8 C)  TempSrc: Axillary Axillary   Oral  SpO2: 98% 99% 99% 99%  Weight:      Height:        Intake/Output Summary (Last 24 hours) at 01/08/2022 1146 Last data filed at 01/08/2022 0600 Gross per 24 hour  Intake 879.93 ml  Output 535 ml  Net 344.93 ml    Filed Weights   01/05/22 0500 01/06/22 0500 01/07/22 0325  Weight: 78.6 kg 76.7 kg 80.5 kg    Physical Examination: Body mass index is 27.8 kg/m.   General:  Average built, not in obvious distress, appears older than stated age, ill and deconditioned, HENT: Mild pallor noted.  Oral mucosa is moist.  Trach tube and NG tube in place. Chest:    Diminished breath sounds bilaterally. No crackles or wheezes.  CVS: S1 &S2 heard. No murmur.  Regular rate and rhythm. Abdomen: Soft, nontender, stented abdomen bowel sounds are heard.  Foley catheter in place. Extremities: No cyanosis, clubbing with the lower extremity edema peripheral pulses are palpable.  Left upper extremity PICC line in place. Psych: Alert, awake and communicative.  Normal mood CNS: Moves all extremities. Skin: Warm and dry.  No rashes noted.   Data Reviewed:   CBC: Recent Labs  Lab 01/04/22 1749 01/05/22 0507 01/06/22 0540 01/07/22 0324 01/08/22 0531  WBC 20.4* 26.9*  26.4* 23.8* 23.5* 18.6*  NEUTROABS  --  23.8*  --   --   --  HGB 7.6* 7.5*  7.6* 8.0* 8.8* 7.8*  HCT 21.7* 21.7*  21.6* 23.3* 27.1* 23.7*  MCV 93.1 95.2  94.3 96.7 99.3 100.9*  PLT 55* 54*  56*  54* 59* 65* 53*     Basic Metabolic Panel: Recent Labs  Lab 01/04/22 0852 01/05/22 0507 01/06/22 0540 01/06/22 1446 01/07/22 0324 01/07/22 1855 01/08/22 0531  NA  --  133* 135 136 135 135 134*  K  --  4.0 3.6 3.8 3.7 3.7 3.6  CL  --  104 105 107 106 110 105  CO2  --  17* 17* 16* 16* 14* 17*  GLUCOSE  --  99 146* 130* 122* 98 112*  BUN  --  64* 69* 69* 75* 76* 81*  CREATININE  --  2.99* 3.25* 3.27* 3.30* 3.38* 3.57*  CALCIUM  --  8.8* 8.7* 8.7* 8.7* 8.4* 9.2  MG 1.9 1.9 2.3  --   --  2.2 2.4  PHOS 3.5 3.7 3.8  --   3.7  --  4.3     Liver Function Tests: Recent Labs  Lab 01/02/22 0345 01/03/22 0440 01/04/22 0430 01/05/22 0507 01/06/22 0540 01/07/22 0324 01/08/22 0531  AST 44* 32  --   --   --   --  31  ALT 22 19  --   --   --   --  20  ALKPHOS 109 107  --   --   --   --  111  BILITOT 12.0* 8.9* 9.0* 7.8* 6.9* 6.0* 5.3*  PROT 4.9* 4.6*  --   --   --   --  5.2*  ALBUMIN 2.4* 2.2*  --  2.4* 2.3* 2.2* 2.2*      Radiology Studies: DG Abd Portable 1V  Result Date: 01/07/2022 CLINICAL DATA:  Abdominal stent shin. EXAM: PORTABLE ABDOMEN - 1 VIEW COMPARISON:  01/03/2022 FINDINGS: Feeding tube tip is in the mid stomach. NG tube tip is in the stomach with tip overlying the cardia. Proximal side port of the NG tube is well below the GE junction. Interval decrease in gaseous bowel distension seen on the prior study with no gaseous dilatation currently to suggest bowel obstruction. IMPRESSION: 1. Feeding tube tip is in the mid stomach. 2. NG tube tip is in the stomach. 3. No gaseous bowel dilatation to suggest obstruction. Electronically Signed   By: Misty Stanley M.D.   On: 01/07/2022 05:55      LOS: 12 days    Flora Lipps, MD [facility] Available via Epic secure chat 7am-7pm After these hours, please refer to coverage provider listed on [email or web address] 01/08/2022, 11:46 AM

## 2022-01-09 DIAGNOSIS — N179 Acute kidney failure, unspecified: Secondary | ICD-10-CM | POA: Diagnosis not present

## 2022-01-09 DIAGNOSIS — K7011 Alcoholic hepatitis with ascites: Secondary | ICD-10-CM | POA: Diagnosis not present

## 2022-01-09 DIAGNOSIS — D508 Other iron deficiency anemias: Secondary | ICD-10-CM | POA: Diagnosis not present

## 2022-01-09 LAB — COMPREHENSIVE METABOLIC PANEL
ALT: 24 U/L (ref 0–44)
AST: 41 U/L (ref 15–41)
Albumin: 2.3 g/dL — ABNORMAL LOW (ref 3.5–5.0)
Alkaline Phosphatase: 139 U/L — ABNORMAL HIGH (ref 38–126)
Anion gap: 10 (ref 5–15)
BUN: 87 mg/dL — ABNORMAL HIGH (ref 6–20)
CO2: 18 mmol/L — ABNORMAL LOW (ref 22–32)
Calcium: 8.9 mg/dL (ref 8.9–10.3)
Chloride: 108 mmol/L (ref 98–111)
Creatinine, Ser: 3.56 mg/dL — ABNORMAL HIGH (ref 0.44–1.00)
GFR, Estimated: 14 mL/min — ABNORMAL LOW (ref >60)
Glucose, Bld: 141 mg/dL — ABNORMAL HIGH (ref 70–99)
Potassium: 3.8 mmol/L (ref 3.5–5.1)
Sodium: 136 mmol/L (ref 135–145)
Total Bilirubin: 4.8 mg/dL — ABNORMAL HIGH (ref 0.3–1.2)
Total Protein: 5.3 g/dL — ABNORMAL LOW (ref 6.5–8.1)

## 2022-01-09 LAB — CBC
HCT: 24.7 % — ABNORMAL LOW (ref 36.0–46.0)
Hemoglobin: 7.8 g/dL — ABNORMAL LOW (ref 12.0–15.0)
MCH: 32 pg (ref 26.0–34.0)
MCHC: 31.6 g/dL (ref 30.0–36.0)
MCV: 101.2 fL — ABNORMAL HIGH (ref 80.0–100.0)
Platelets: 57 10*3/uL — ABNORMAL LOW (ref 150–400)
RBC: 2.44 MIL/uL — ABNORMAL LOW (ref 3.87–5.11)
RDW: 28.1 % — ABNORMAL HIGH (ref 11.5–15.5)
WBC: 18.3 10*3/uL — ABNORMAL HIGH (ref 4.0–10.5)
nRBC: 0.1 % (ref 0.0–0.2)

## 2022-01-09 LAB — GLUCOSE, CAPILLARY
Glucose-Capillary: 145 mg/dL — ABNORMAL HIGH (ref 70–99)
Glucose-Capillary: 112 mg/dL — ABNORMAL HIGH (ref 70–99)
Glucose-Capillary: 103 mg/dL — ABNORMAL HIGH (ref 70–99)
Glucose-Capillary: 108 mg/dL — ABNORMAL HIGH (ref 70–99)
Glucose-Capillary: 131 mg/dL — ABNORMAL HIGH (ref 70–99)
Glucose-Capillary: 114 mg/dL — ABNORMAL HIGH (ref 70–99)

## 2022-01-09 LAB — PHOSPHORUS: Phosphorus: 3.7 mg/dL (ref 2.5–4.6)

## 2022-01-09 LAB — MAGNESIUM: Magnesium: 2.1 mg/dL (ref 1.7–2.4)

## 2022-01-09 MED ORDER — RENA-VITE PO TABS
1.0000 | ORAL_TABLET | Freq: Every day | ORAL | Status: DC
  Administered 2022-01-10 – 2022-01-14 (×5): 1
  Filled 2022-01-09 (×5): qty 1

## 2022-01-09 MED ORDER — OSMOLITE 1.5 CAL PO LIQD
1000.0000 mL | ORAL | Status: DC
  Administered 2022-01-09 – 2022-01-12 (×3): 1000 mL
  Filled 2022-01-09 (×5): qty 1000

## 2022-01-09 MED ORDER — FUROSEMIDE 10 MG/ML IJ SOLN
80.0000 mg | Freq: Two times a day (BID) | INTRAMUSCULAR | Status: DC
  Administered 2022-01-09 – 2022-01-14 (×10): 80 mg via INTRAVENOUS
  Filled 2022-01-09 (×10): qty 8

## 2022-01-09 MED ORDER — ALTEPLASE 2 MG IJ SOLR
2.0000 mg | Freq: Once | INTRAMUSCULAR | Status: AC
  Administered 2022-01-10: 2 mg
  Filled 2022-01-09: qty 2

## 2022-01-09 NOTE — Progress Notes (Signed)
Physical Therapy Treatment Patient Details Name: Laura [name] MRN: [id number] DOB: [birth year] Today's Date: 01/09/2022   History of Present Illness Patient is a 61 y/o female who presents on 9/9 with weakness and a fall. Admitted with severe anemia, Acute hypoxemic respiratory failure, septic shock, SVTs, hypotension, acute kidney injury, SIRS, mild rhabdomyolosis. PMH includes chronic back pain, alcohol use and tobacco use disorder.    PT Comments    Patient remains greatly limited by profound weakness, generalized pain with mobility, and fatigue. Pt required MAX/Total +2 assist with bed pad to pivot hips and sit EOB. Pt unable to tolerate today due to pain and returned to supine. Rolling completed bil and RN notified of flexiseal leak and soiled mepilex pads. Max +2 to roll for pericare from RN and dressings changed. UE exercises completed in supine with pt more responsive and attempting to initiate some UE movement with repetition and repeated cues. Will continue to progress as able.   Recommendations for follow up therapy are one component of a multi-disciplinary discharge planning process, led by the attending physician.  Recommendations may be updated based on patient status, additional functional criteria and insurance authorization.  Follow Up Recommendations  Skilled nursing-short term rehab (<3 hours/day) Can patient physically be transported by private vehicle: No   Assistance Recommended at Discharge Frequent or constant Supervision/Assistance  Patient can return home with the following Two people to help with walking and/or transfers;Two people to help with bathing/dressing/bathroom;Assistance with cooking/housework;Assistance with feeding;Direct supervision/assist for medications management;Direct supervision/assist for financial management;Assist for transportation;Help with stairs or ramp for entrance   Equipment Recommendations  None recommended by PT    Recommendations for  Other Services       Precautions / Restrictions Precautions Precautions: Fall Precaution Comments: watch O2, flexiseal, foley, PICC, NGT Restrictions Weight Bearing Restrictions: No     Mobility  Bed Mobility Overal bed mobility: Needs Assistance Bed Mobility: Rolling, Supine to Sit, Sit to Supine Rolling: Max assist   Supine to sit: Total assist, +2 for physical assistance, +2 for safety/equipment, HOB elevated Sit to supine: +2 for physical assistance, Total assist, +2 for safety/equipment, HOB elevated        Transfers                        Ambulation/Gait                   Stairs             Wheelchair Mobility    Modified Rankin (Stroke Patients Only)       Balance Overall balance assessment: Needs assistance Sitting-balance support: Feet supported, Bilateral upper extremity supported Sitting balance-Leahy Scale: Zero Sitting balance - Comments: pt unable to tolerate sitting EOB due to pain, tearful and moaning due to knee and generalized pain.                                    Cognition Arousal/Alertness: Awake/alert (slightly lethargic at start) Behavior During Therapy: Flat affect, Anxious Overall Cognitive Status: No family/caregiver present to determine baseline cognitive functioning                                 General Comments: Delayed responses, Eyes remained closed for first half of session. pt opened eyes when asks, gripping therapist hand when asked and  answering simple questions with 1-2 word answers.        Exercises Other Exercises Other Exercises: AAROM for bil UE in supine: flex/ext at elbow x10, should flex x5.    General Comments        Pertinent Vitals/Pain Pain Assessment Pain Assessment: Faces Faces Pain Scale: Hurts whole lot Pain Location: bil knees and general pain with mobility. Pain Descriptors / Indicators: Discomfort, Grimacing, Guarding, Moaning Pain  Intervention(s): Monitored during session, Repositioned, Limited activity within patient's tolerance    Home Living                          Prior Function            PT Goals (current goals can now be found in the care plan section) Acute Rehab PT Goals PT Goal Formulation: With patient Time For Goal Achievement: 01/13/22 Potential to Achieve Goals: Fair Progress towards PT goals: Progressing toward goals (limited)    Frequency    Min 2X/week      PT Plan Current plan remains appropriate    Co-evaluation              AM-PAC PT "6 Clicks" Mobility   Outcome Measure  Help needed turning from your back to your side while in a flat bed without using bedrails?: Total Help needed moving from lying on your back to sitting on the side of a flat bed without using bedrails?: Total Help needed moving to and from a bed to a chair (including a wheelchair)?: Total Help needed standing up from a chair using your arms (e.g., wheelchair or bedside chair)?: Total Help needed to walk in hospital room?: Total Help needed climbing 3-5 steps with a railing? : Total 6 Click Score: 6    End of Session   Activity Tolerance: Patient limited by fatigue;Patient limited by pain Patient left: in bed;with call bell/phone within reach;with bed alarm set;with SCD's reapplied Nurse Communication: Mobility status PT Visit Diagnosis: Other abnormalities of gait and mobility (R26.89);Muscle weakness (generalized) (M62.81);Pain     Time: 0321-2248 PT Time Calculation (min) (ACUTE ONLY): 47 min  Charges:  $Therapeutic Activity: 23-37 mins                     Wynn Maudlin, DPT Acute Rehabilitation Services Office [phone] Pager [phone]  01/09/22 3:54 PM

## 2022-01-09 NOTE — Consult Note (Signed)
   [facility] Inpatient Consult   01/09/2022  [name] 10/09/1960 [id number]  [facility] [ACO] Patient: Laura [name]  Primary Care Provider:  Lauree Chandler, NP, [facility]   Patient screened for length of stay hospitalization with noted high risk score for unplanned readmission risk and to assess for potential [facility] service needs for post hospital transition.  Review of patient's medical record reveals patient is awaiting PT evalution for disposition needs.  1450 Rounding on unit MD with patient.  Plan:  Continue to follow progress and disposition to assess for post hospital care management needs.    For questions contact:   Natividad Brood, RN BSN [facility] Liaison  [phone] business mobile phone Toll free office [phone]  Fax number: [phone] [email or web address]

## 2022-01-09 NOTE — TOC Progression Note (Signed)
Transition of Care [facility]) - Progression Note    Patient Details  Name: Angelin Cutrone MRN: [id number] Date of Birth: 06-Jan-1961  Transition [facility]) CM/SW [place], [place] Phone Number: 01/09/2022, 1:01 PM  Clinical Narrative:    CSW met with pt and family at bedside at their request. CSW addressed the pt as [name] as indicated in the chart as his preference as well as he/him pronouns. Family utilized she/ her pronouns, pt unable to confirm preference at this time. Family requesting an update to SNF placement and pt's medical status. CSW noted that all medical questions would need to be directed to the MD or nurse. CSW noted that pt still has a cortrak and several other tubes preventing discharge. The MD will alert TOC when pt is more medically stable. Family noted understanding. Different rehab venues were discussed, but CSW is unable to provide recommendations until PT sees pt. TOC will continue to follow for DC needs.     Barriers to Discharge: Continued Medical Work up  Expected Discharge Plan and Services     Discharge Planning Services: CM Consult   Living arrangements for the past 2 months: Apartment                                       Social Determinants of Health (SDOH) Interventions    Readmission Risk Interventions    07/02/2021   11:32 AM  Readmission Risk Prevention Plan  Post Dischage Appt Complete  Medication Screening Complete  Transportation Screening Complete

## 2022-01-09 NOTE — Plan of Care (Signed)
  Problem: Education: Goal: Knowledge of General Education information will improve Description: Including pain rating scale, medication(s)/side effects and non-pharmacologic comfort measures Outcome: Progressing   Problem: Clinical Measurements: Goal: Will remain free from infection Outcome: Progressing   Problem: Activity: Goal: Risk for activity intolerance will decrease Outcome: Progressing   Problem: Nutrition: Goal: Adequate nutrition will be maintained Outcome: Progressing   Problem: Skin Integrity: Goal: Risk for impaired skin integrity will decrease Outcome: Progressing   

## 2022-01-09 NOTE — Progress Notes (Signed)
Subjective:   UOP inc to 950-  kidney labs pretty stable -  no new c/o's -  noted concerns about mobility-  less responsive today -  family at bedside    Objective Vital signs in last 24 hours: Vitals:   01/08/22 2013 01/08/22 2314 01/09/22 0540 01/09/22 0727  BP: 97/65 118/71    Pulse:      Resp: 14 17    Temp: (!) 97.1 F (36.2 C) (!) 97.5 F (36.4 C)  98.6 F (37 C)  TempSrc: Axillary Oral  Oral  SpO2: 100% 100%    Weight:   84.7 kg   Height:       Weight change:   Intake/Output Summary (Last 24 hours) at 01/09/2022 1040 Last data filed at 01/09/2022 0600 Gross per 24 hour  Intake 1128.27 ml  Output 1010 ml  Net 118.27 ml    Assessment/ Plan: Pt is a 61 y.o. yo adult who was admitted on 12/27/2021 with  weakness -  found to have pancytopenia in the setting of known liver dz-  also has developed AKI  Assessment/Plan: 1. Renal-  crt 0.5 in June of 2023-  crt on 9/9 was 2.1---- down to 1.3 on 9/12 but then started to rise slowly  -  up over 3.5 now -  had some hypotension around the time of crt rise.  Differential includes ATN vs HRS.  Hemodynamics have improved but UOP still low-  maybe picking up now ??  Urine sodium 11.  Receiving max medical therapy for HRS-  on  midodrine, octreotide -  crt still rising but very slowly and no absolute indications for HD.  Traditionally dialysis is not recommended for HRS because prognosis is so poor-  thankfully have not had to make that decision yet 2. HTN/volume-   weaned off of levo--- on midodrine.  By Is and Os is 19 liters positive-  seems like did respond a little to lasix -  have started some scheduled with good response 3. Anemia-  received many transfusions since admit-  heme previously involved considering BMBx but now have signed off--  giving ESA 4. Liver dz-  bili is improving -  GI says cirrhosis and put him on lactulose and rifaximine-  meld score 31 est 52% mortality in 3 mos  5. Metabolic acidosis-  stable with oral sodium bicarb    Cecille Aver    Labs: Basic Metabolic Panel: Recent Labs  Lab 01/07/22 0324 01/07/22 1855 01/08/22 0531 01/09/22 0320  NA 135 135 134* 136  K 3.7 3.7 3.6 3.8  CL 106 110 105 108  CO2 16* 14* 17* 18*  GLUCOSE 122* 98 112* 141*  BUN 75* 76* 81* 87*  CREATININE 3.30* 3.38* 3.57* 3.56*  CALCIUM 8.7* 8.4* 9.2 8.9  PHOS 3.7  --  4.3 3.7   Liver Function Tests: Recent Labs  Lab 01/03/22 0440 01/04/22 0430 01/07/22 0324 01/08/22 0531 01/09/22 0320  AST 32  --   --  31 41  ALT 19  --   --  20 24  ALKPHOS 107  --   --  111 139*  BILITOT 8.9*   < > 6.0* 5.3* 4.8*  PROT 4.6*  --   --  5.2* 5.3*  ALBUMIN 2.2*   < > 2.2* 2.2* 2.3*   < > = values in this interval not displayed.   No results for input(s): "LIPASE", "AMYLASE" in the last 168 hours. No results for input(s): "AMMONIA" in the last  168 hours.  CBC: Recent Labs  Lab 01/05/22 0507 01/06/22 0540 01/07/22 0324 01/08/22 0531 01/09/22 0320  WBC 26.9*  26.4* 23.8* 23.5* 18.6* 18.3*  NEUTROABS 23.8*  --   --   --   --   HGB 7.5*  7.6* 8.0* 8.8* 7.8* 7.8*  HCT 21.7*  21.6* 23.3* 27.1* 23.7* 24.7*  MCV 95.2  94.3 96.7 99.3 100.9* 101.2*  PLT 54*  56*  54* 59* 65* 53* 57*   Cardiac Enzymes: No results for input(s): "CKTOTAL", "CKMB", "CKMBINDEX", "TROPONINI" in the last 168 hours. CBG: Recent Labs  Lab 01/08/22 1553 01/08/22 2043 01/09/22 0126 01/09/22 0531 01/09/22 0822  GLUCAP 114* 126* 145* 112* 103*    Iron Studies: No results for input(s): "IRON", "TIBC", "TRANSFERRIN", "FERRITIN" in the last 72 hours. Studies/Results: No results found. Medications: Infusions:  sodium chloride 10 mL/hr at 01/07/22 2000   feeding supplement (OSMOLITE 1.5 CAL) 22 mL/hr at 01/07/22 2000   octreotide (SANDOSTATIN) 500 mcg in sodium chloride 0.9 % 250 mL (2 mcg/mL) infusion 50 mcg/hr (01/08/22 2315)    Scheduled Medications:  Chlorhexidine Gluconate Cloth  6 each Topical Q0600   cholecalciferol   2,000 Units Per Tube Daily   darbepoetin (ARANESP) injection - NON-DIALYSIS  200 mcg Subcutaneous Q Thu-1800   feeding supplement (PROSource TF20)  60 mL Per Tube BID   folic acid  1 mg Per Tube Daily   furosemide  80 mg Intravenous Q6H   Gerhardt's butt cream   Topical BID   lactulose  10 g Per Tube BID   leptospermum manuka honey  1 Application Topical Daily   metoprolol tartrate  12.5 mg Per Tube BID   midodrine  15 mg Per Tube Q8H   multivitamin  15 mL Per Tube Daily   mouth rinse  15 mL Mouth Rinse 4 times per day   pantoprazole  40 mg Per Tube Daily   rifaximin  550 mg Per Tube TID   sodium bicarbonate  1,300 mg Per Tube BID   sodium chloride flush  10-40 mL Intracatheter Q12H   thiamine  100 mg Per Tube Daily    have reviewed scheduled and prn medications.  Physical Exam: General: alert, no c/o's-  pretty debilitated -  less responsive today  Heart: RRR Lungs: mostly clear Abdomen: soft, non tender Extremities: dep pitting edema -  improved     01/09/2022,10:40 AM  LOS: 13 days

## 2022-01-09 NOTE — Progress Notes (Signed)
PROGRESS NOTE    Laura [name]  ZOX:[id number] DOB: 1961/02/19 DOA: 12/27/2021 PCP: Sharon Seller, NP    Brief Narrative:  Patient is a 61 years old  with past medical history of symptomatic anemia, hypertension, SVT, chronic back pain, tobacco use disorder patient hospital with progressive weakness and fall while attempting to go to the bathroom.  Patient was on the floor for at least 1 day.  EMS found the patient to be very hypotensive in SVT.  Patient identifies as female and lives in an apartment and cousin assist him with medical care at home.  History of alcohol and smoking.  Patient does have episodes of incontinence at baseline.  In the ED, patient was noted to have recurrent SVT with hypotension.  Labs showed hypokalemia at 3.1 and creatinine was elevated at 2.1.  Albumin 2.3.  Ammonia was elevated at 60.  AST ALT mildly elevated.  Lactate was elevated at 4.0.  WBC at 5.5 and platelets at 77.  Chest x-ray was negative for COVID was negative as well.  Patient was then initially admitted to the ICU service for acute alcoholic hepatitis, pancytopenia with possible hepatorenal syndrome.  Patient was then transferred out of the ICU.  Significant Hospital events.  9/9 Admit s/p mechanical fall initial Hgb 5.0 > 2 U pRBC-Vancomycin and Cefepime started 9/11 Hgb 6.8 > 1U pRBC; Bcx 1 NG 2 days-pulled off vancomycin 9/12 1/2 Bcx grew staphylococcus species, possible contaminant 9/13 continued thrombocytopenia, hem onc consulted. Worsening encephalopathy-EEG severe encephalopathy/cefepime toxicity, CT head no acute issues, stopped cefepime switched to CTX 9/14 Increasing WBC-repeat Bcx, restart vancomycin and continue CTX-Bcx Staphylococcus warneri 1/2 likely contaminant, Cortrak placed, Albumin x 2, MAP goal of 80-85 possible hepatorenal 9/15 levophed, albumin and octreotide added for potential hepatorenal syndrome 9/16 NGT placed for N/V 9/17 Nephrology consult, recs: Continue with biopsy if  clinically relevant, continue with rifaximin and Lactulose for hepatic encephalopathy 9/21.  GI has signed off.  No recommendation for liver biopsy.  Assessment and Plan: Principal Problem:   Anemia Active Problems:   Hyperbilirubinemia   Pressure injury of skin   Alcoholic cirrhosis of liver without ascites (HCC)   Alcoholic hepatitis with ascites   AKI (acute kidney injury) (HCC)   Thrombocytopenia (HCC)   Acute alcoholic hepatitis with possible hepatorenal syndrome Anion Gap Metabolic Acidosis GI followed the patient during hospitalization.  Likely acute alcoholic hepatitis with hepatorenal syndrome.  On midodrine and octreotide.  Status post albumin challenge 9/17.  Minimal ascites on ultrasound 9/16.  Continue rifaximin and lactulose. Creatinine at 3.5 from 3.3 < 3.3< 3.2 and 2.9.  On high-dose Lasix 80 mg IV twice daily. ANA and Anti-mitochondrial labs within normal limits.  Nephrology on board.  No indication for hemodialysis yet.  On sodium bicarb for metabolic acidosis.  Bilirubin is improving.  MELD score of 31 estimated 52% mortality in 3 months.  Patient is positive fluid balance for 81191 mL.   Pancytopenia Hematology followed the patient during hospitalization.  Has signed off at this time.  DIC panel negative.  Has thrombocytopenia and mild leukocytosis at this time.    Had received 2 units of packed RBC initially.  Globin today at 7.8.  We will continue to monitor.  Acute metabolic encephalopathy secondary to to cefepime toxicity, mild hyperammonemia, multiorgan failure Cefepime discontinued based on EEG results and transitioned to ceftriaxone 9/13, completed on 9/15. CT head without acute issues.    Received thiamine.  Continue oral lactulose and rifaximin.  On fentanyl  as needed pain due to renal function.  He is able to communicate somewhat.  Ileus  N/V Has had some nausea vomiting  and needed NG tube suctioning.  On cortrak tube tube and has been restarted on tube  feeding.  We will continue to  titrate up to goal tolerated.  We will continue antiemetics and supportive care.  Discontinue NG tube if no further vomiting.   Sepsis 2/2 Urinary Source or Skin Wounds   Blood culture showed Staphylococcus warneri in 1/2 likely contaminant.  Patient received 7 days of cefepime/ceftriaxone completed on 9/15. Blood culture 9/14 negative to date.    Supraventricular tachycardia. Magnesium goal more than 2 and potassium goal more than 4.  Has episodes of SVT during hospitalization.  Latest magnesium of 2.1.  Has been started on low-dose metoprolol.   Adult Failure to Thrive  Patient wishes Laura [name] [phone] to make decisions for him and his goddaughter. Family meeting held 9/16.  Palliative care on board for ongoing goals of care discussion.  Deep tissue injury buttocks, perineum, sacrum.  Present on admission -wound care management  Debility, deconditioning.  Patient will likely need home health services on discharge.    DVT prophylaxis: Place and maintain sequential compression device Start: 12/27/21 1918 SCDs Start: 12/27/21 1828   Code Status:     Code Status: Full Code  Disposition: Uncertain at this time.  Status is: Inpatient  Remains inpatient appropriate because: Pending clinical improvement, multiple organ dysfunction, renal failure   Family Communication:  Spoke with the patient's wife at bedside on 01/08/2022.  Consultants:  Critical care  Nephrology   palliative care GI  Procedures:  RBC transfusion Cortrak tube tube insertion NG tube insertion Foley catheter placement. Rectal tube placement. PICC line placement.  Antimicrobials:  Rifaximin  Subjective: Today, patient was seen and examined at bedside.  Slow to respond but denies pain nausea vomiting.  Appears to be little more sleepy today.   Objective: Vitals:   01/08/22 2013 01/08/22 2314 01/09/22 0540 01/09/22 0727  BP: 97/65 118/71    Pulse:      Resp: 14 17     Temp: (!) 97.1 F (36.2 C) (!) 97.5 F (36.4 C)  98.6 F (37 C)  TempSrc: Axillary Oral  Oral  SpO2: 100% 100%    Weight:   84.7 kg   Height:        Intake/Output Summary (Last 24 hours) at 01/09/2022 1509 Last data filed at 01/09/2022 0600 Gross per 24 hour  Intake 517 ml  Output 300 ml  Net 217 ml    Filed Weights   01/06/22 0500 01/07/22 0325 01/09/22 0540  Weight: 76.7 kg 80.5 kg 84.7 kg    Physical Examination: Body mass index is 29.25 kg/m.   General:  Average built, not in obvious distress, appears older than stated age, ill and deconditioned, mildly somnolent HENT:   Alert noted.  Oral mucosa is moist.  Cortrak tube and NG tube in place Chest:    Diminished breath sounds bilaterally. No crackles or wheezes.  CVS: S1 &S2 heard. No murmur.  Regular rate and rhythm. Abdomen: Soft, nontender, nondistended.  Bowel sounds are heard.  Rectal tube in place, Foley catheter in place. Extremities: No cyanosis, clubbing or edema.  Peripheral pulses are palpable.  Left upper extremity PICC line in place Psych: Alert, awake and Communicative, mildly somnolent  CNS:  No cranial nerve deficits.  Moves extremities. Skin: Warm and dry.  No rashes noted.   Data  Reviewed:   CBC: Recent Labs  Lab 01/05/22 0507 01/06/22 0540 01/07/22 0324 01/08/22 0531 01/09/22 0320  WBC 26.9*  26.4* 23.8* 23.5* 18.6* 18.3*  NEUTROABS 23.8*  --   --   --   --   HGB 7.5*  7.6* 8.0* 8.8* 7.8* 7.8*  HCT 21.7*  21.6* 23.3* 27.1* 23.7* 24.7*  MCV 95.2  94.3 96.7 99.3 100.9* 101.2*  PLT 54*  56*  54* 59* 65* 53* 57*     Basic Metabolic Panel: Recent Labs  Lab 01/05/22 0507 01/06/22 0540 01/06/22 1446 01/07/22 0324 01/07/22 1855 01/08/22 0531 01/09/22 0320  NA 133* 135 136 135 135 134* 136  K 4.0 3.6 3.8 3.7 3.7 3.6 3.8  CL 104 105 107 106 110 105 108  CO2 17* 17* 16* 16* 14* 17* 18*  GLUCOSE 99 146* 130* 122* 98 112* 141*  BUN 64* 69* 69* 75* 76* 81* 87*  CREATININE 2.99*  3.25* 3.27* 3.30* 3.38* 3.57* 3.56*  CALCIUM 8.8* 8.7* 8.7* 8.7* 8.4* 9.2 8.9  MG 1.9 2.3  --   --  2.2 2.4 2.1  PHOS 3.7 3.8  --  3.7  --  4.3 3.7     Liver Function Tests: Recent Labs  Lab 01/03/22 0440 01/04/22 0430 01/05/22 0507 01/06/22 0540 01/07/22 0324 01/08/22 0531 01/09/22 0320  AST 32  --   --   --   --  31 41  ALT 19  --   --   --   --  20 24  ALKPHOS 107  --   --   --   --  111 139*  BILITOT 8.9*   < > 7.8* 6.9* 6.0* 5.3* 4.8*  PROT 4.6*  --   --   --   --  5.2* 5.3*  ALBUMIN 2.2*  --  2.4* 2.3* 2.2* 2.2* 2.3*   < > = values in this interval not displayed.      Radiology Studies: No results found.    LOS: 13 days    Flora Lipps, MD [facility] Available via Epic secure chat 7am-7pm After these hours, please refer to coverage provider listed on [email or web address] 01/09/2022, 3:09 PM

## 2022-01-10 DIAGNOSIS — N179 Acute kidney failure, unspecified: Secondary | ICD-10-CM | POA: Diagnosis not present

## 2022-01-10 DIAGNOSIS — D508 Other iron deficiency anemias: Secondary | ICD-10-CM | POA: Diagnosis not present

## 2022-01-10 DIAGNOSIS — K7011 Alcoholic hepatitis with ascites: Secondary | ICD-10-CM | POA: Diagnosis not present

## 2022-01-10 LAB — CBC
HCT: 25 % — ABNORMAL LOW (ref 36.0–46.0)
Hemoglobin: 7.8 g/dL — ABNORMAL LOW (ref 12.0–15.0)
MCH: 32.4 pg (ref 26.0–34.0)
MCHC: 31.2 g/dL (ref 30.0–36.0)
MCV: 103.7 fL — ABNORMAL HIGH (ref 80.0–100.0)
Platelets: 58 10*3/uL — ABNORMAL LOW (ref 150–400)
RBC: 2.41 MIL/uL — ABNORMAL LOW (ref 3.87–5.11)
RDW: 28.4 % — ABNORMAL HIGH (ref 11.5–15.5)
WBC: 16.8 10*3/uL — ABNORMAL HIGH (ref 4.0–10.5)
nRBC: 0.1 % (ref 0.0–0.2)

## 2022-01-10 LAB — RENAL FUNCTION PANEL
Albumin: 2.3 g/dL — ABNORMAL LOW (ref 3.5–5.0)
Anion gap: 11 (ref 5–15)
BUN: 91 mg/dL — ABNORMAL HIGH (ref 6–20)
CO2: 18 mmol/L — ABNORMAL LOW (ref 22–32)
Calcium: 9 mg/dL (ref 8.9–10.3)
Chloride: 108 mmol/L (ref 98–111)
Creatinine, Ser: 3.53 mg/dL — ABNORMAL HIGH (ref 0.44–1.00)
GFR, Estimated: 14 mL/min — ABNORMAL LOW (ref >60)
Glucose, Bld: 132 mg/dL — ABNORMAL HIGH (ref 70–99)
Phosphorus: 4.1 mg/dL (ref 2.5–4.6)
Potassium: 3.5 mmol/L (ref 3.5–5.1)
Sodium: 137 mmol/L (ref 135–145)

## 2022-01-10 LAB — PROTIME-INR
INR: 1.3 — ABNORMAL HIGH (ref 0.8–1.2)
Prothrombin Time: 16.5 seconds — ABNORMAL HIGH (ref 11.4–15.2)

## 2022-01-10 LAB — MAGNESIUM: Magnesium: 2 mg/dL (ref 1.7–2.4)

## 2022-01-10 LAB — GLUCOSE, CAPILLARY
Glucose-Capillary: 112 mg/dL — ABNORMAL HIGH (ref 70–99)
Glucose-Capillary: 119 mg/dL — ABNORMAL HIGH (ref 70–99)
Glucose-Capillary: 131 mg/dL — ABNORMAL HIGH (ref 70–99)
Glucose-Capillary: 132 mg/dL — ABNORMAL HIGH (ref 70–99)
Glucose-Capillary: 144 mg/dL — ABNORMAL HIGH (ref 70–99)
Glucose-Capillary: 151 mg/dL — ABNORMAL HIGH (ref 70–99)

## 2022-01-10 LAB — BILIRUBIN, FRACTIONATED(TOT/DIR/INDIR)
Bilirubin, Direct: 2.1 mg/dL — ABNORMAL HIGH (ref 0.0–0.2)
Indirect Bilirubin: 1.9 mg/dL — ABNORMAL HIGH (ref 0.3–0.9)
Total Bilirubin: 4 mg/dL — ABNORMAL HIGH (ref 0.3–1.2)

## 2022-01-10 NOTE — Progress Notes (Signed)
PROGRESS NOTE    Jacklyne Baik  ZOX:[id number] DOB: 1960-05-11 DOA: 12/27/2021 PCP: Lauree Chandler, NP    Brief Narrative:  Patient is a 61 years old  with past medical history of symptomatic anemia, hypertension, SVT, chronic back pain, tobacco use disorder patient hospital with progressive weakness and fall while attempting to go to the bathroom.  Patient was on the floor for at least 1 day.  EMS found the patient to be very hypotensive in SVT.  Patient identifies as female and lives in an apartment and cousin assist him with medical care at home.  History of alcohol and smoking.  Patient does have episodes of incontinence at baseline.  In the ED, patient was noted to have recurrent SVT with hypotension.  Labs showed hypokalemia at 3.1 and creatinine was elevated at 2.1.  Albumin 2.3.  Ammonia was elevated at 60.  AST ALT mildly elevated.  Lactate was elevated at 4.0.  WBC at 5.5 and platelets at 77.  Chest x-ray was negative for COVID was negative as well.  Patient was then initially admitted to the ICU service for acute alcoholic hepatitis, pancytopenia with possible hepatorenal syndrome.  Patient was then transferred out of the ICU.  Significant Hospital events.  9/9 Admit s/p mechanical fall initial Hgb 5.0 > 2 U pRBC-Vancomycin and Cefepime started 9/11 Hgb 6.8 > 1U pRBC; Bcx 1 NG 2 days-pulled off vancomycin 9/12 1/2 Bcx grew staphylococcus species, possible contaminant 9/13 continued thrombocytopenia, hem onc consulted. Worsening encephalopathy-EEG severe encephalopathy/cefepime toxicity, CT head no acute issues, stopped cefepime switched to CTX 9/14 Increasing WBC-repeat Bcx, restart vancomycin and continue CTX-Bcx Staphylococcus warneri 1/2 likely contaminant, Cortrak placed, Albumin x 2, MAP goal of 80-85 possible hepatorenal 9/15 levophed, albumin and octreotide added for potential hepatorenal syndrome 9/16 NGT placed for N/V 9/17 Nephrology consult, recs: Continue with biopsy if  clinically relevant, continue with rifaximin and Lactulose for hepatic encephalopathy 9/21.  GI has signed off.  No recommendation for liver biopsy.  Assessment and Plan: Principal Problem:   Anemia Active Problems:   Hyperbilirubinemia   Pressure injury of skin   Alcoholic cirrhosis of liver without ascites (HCC)   Alcoholic hepatitis with ascites   AKI (acute kidney injury) (HCC)   Thrombocytopenia (HCC)   Acute alcoholic hepatitis with possible hepatorenal syndrome Anion Gap Metabolic Acidosis GI followed the patient during hospitalization.  Likely acute alcoholic hepatitis with hepatorenal syndrome.  On midodrine and octreotide.  Status post albumin challenge 9/17.  Minimal ascites on ultrasound 9/16.  Continue rifaximin and lactulose. Creatinine at 3.5 <3.5<3.3 < 3.3< 3.2 and 2.9.  On high-dose Lasix 80 mg IV twice daily. ANA and Anti-mitochondrial labs within normal limits.  Nephrology on board.  No indication for hemodialysis yet.  On sodium bicarb for metabolic acidosis.  Bilirubin is improving.  MELD score of 31 estimated 52% mortality in 3 months.  Patient is positive fluid balance for 19432 mL at 1130 mL output..   Pancytopenia Hematology followed the patient during hospitalization.  Has signed off at this time.  DIC panel negative.  Has thrombocytopenia and mild leukocytosis at this time.    Had received 2 units of packed RBC initially.  Hemoglobin globin today at 7.8.  We will continue to monitor.  Acute metabolic encephalopathy secondary to to cefepime toxicity, mild hyperammonemia, multiorgan failure Cefepime discontinued based on EEG results and transitioned to ceftriaxone 9/13, completed on 9/15. CT head without acute issues.    Received thiamine.  Continue oral lactulose and  rifaximin.  On fentanyl as needed pain due to renal function.  He is able to communicate somewhat.  Appears little more somnolent.  Ileus  N/V Has had some nausea vomiting  and needed NG tube  suctioning.  On cortrak tube tube and has been restarted on tube feeding.  We will continue to  titrate up to goal tolerated.  We will continue antiemetics and supportive care.  Off NG tube at this time.  Sepsis 2/2 Urinary Source or Skin Wounds   Blood culture showed Staphylococcus warneri in 1/2 likely contaminant.  Patient received 7 days of cefepime/ceftriaxone completed on 9/15. Blood culture 9/14 negative to date.    Supraventricular tachycardia. Magnesium goal more than 2 and potassium goal more than 4.  Had episodes of SVT during hospitalization.  Latest magnesium of 2.0.   on low-dose metoprolol.   Adult Failure to Thrive  Patient wishes [name] [phone] to make decisions for him and his goddaughter. Family meeting held 9/16.  Palliative care on board for ongoing goals of care discussion.  Deep tissue injury buttocks, perineum, sacrum.  Present on admission -wound care management  Debility, deconditioning.  Patient will likely need home health services on discharge.    DVT prophylaxis: Place and maintain sequential compression device Start: 12/27/21 1918 SCDs Start: 12/27/21 1828   Code Status:     Code Status: Full Code  Disposition: Uncertain at this time.  Status is: Inpatient  Remains inpatient appropriate because: Pending clinical improvement, multiple organ dysfunction, renal failure overall prognosis seems to be poor.   Family Communication:  Spoke with the patient's wife at bedside on 01/08/2022.  Overall prognosis seems to be poor.  Updated this with the patient's wife at bedside.  She is hopeful for improvement.  Consultants:  Critical care  Nephrology   palliative care GI  Procedures:  RBC transfusion Cortrak tube tube insertion NG tube insertion Foley catheter placement. Rectal tube placement. PICC line placement.  Antimicrobials:  Rifaximin  Subjective: Today, patient was seen and examined at bedside.  Slow to respond but denies pain  nausea vomiting.  Appears to be little more sleepy today.   Objective: Vitals:   01/09/22 2043 01/10/22 0100 01/10/22 0500 01/10/22 0715  BP: 95/70 101/67 101/68 128/82  Pulse: 60 67 67 65  Resp: 12  17 20   Temp: 98.2 F (36.8 C) 98.4 F (36.9 C) 98.5 F (36.9 C) 97.6 F (36.4 C)  TempSrc: Oral Oral Oral Axillary  SpO2: 100%  100% 99%  Weight:      Height:        Intake/Output Summary (Last 24 hours) at 01/10/2022 1144 Last data filed at 01/10/2022 0100 Gross per 24 hour  Intake 952.29 ml  Output 1130 ml  Net -177.71 ml    Filed Weights   01/06/22 0500 01/07/22 0325 01/09/22 0540  Weight: 76.7 kg 80.5 kg 84.7 kg    Physical Examination: Body mass index is 29.25 kg/m.   General:  Average built, not in obvious distress, older than stated age, deconditioned, mildly somnolent HENT:   No scleral pallor or icterus noted. Oral mucosa is moist.  Chest:    Diminished breath sounds bilaterally. CVS: S1 &S2 heard. No murmur.  Regular rate and rhythm. Abdomen: Soft, nontender, nondistended.  Bowel sounds are heard.  Rectal tube in place, Foley catheter in place, Extremities: No cyanosis, clubbing or edema.  Peripheral pulses are palpable.  Upper extremity PICC line in place. Psych: Mildly somnolent today,  CNS: Moves extremities,  Skin: Warm and dry.   Data Reviewed:   CBC: Recent Labs  Lab 01/05/22 0507 01/06/22 0540 01/07/22 0324 01/08/22 0531 01/09/22 0320 01/10/22 0256  WBC 26.9*  26.4* 23.8* 23.5* 18.6* 18.3* 16.8*  NEUTROABS 23.8*  --   --   --   --   --   HGB 7.5*  7.6* 8.0* 8.8* 7.8* 7.8* 7.8*  HCT 21.7*  21.6* 23.3* 27.1* 23.7* 24.7* 25.0*  MCV 95.2  94.3 96.7 99.3 100.9* 101.2* 103.7*  PLT 54*  56*  54* 59* 65* 53* 57* 58*     Basic Metabolic Panel: Recent Labs  Lab 01/06/22 0540 01/06/22 1446 01/07/22 0324 01/07/22 1855 01/08/22 0531 01/09/22 0320 01/10/22 0256  NA 135   < > 135 135 134* 136 137  K 3.6   < > 3.7 3.7 3.6 3.8 3.5  CL 105    < > 106 110 105 108 108  CO2 17*   < > 16* 14* 17* 18* 18*  GLUCOSE 146*   < > 122* 98 112* 141* 132*  BUN 69*   < > 75* 76* 81* 87* 91*  CREATININE 3.25*   < > 3.30* 3.38* 3.57* 3.56* 3.53*  CALCIUM 8.7*   < > 8.7* 8.4* 9.2 8.9 9.0  MG 2.3  --   --  2.2 2.4 2.1 2.0  PHOS 3.8  --  3.7  --  4.3 3.7 4.1   < > = values in this interval not displayed.     Liver Function Tests: Recent Labs  Lab 01/06/22 0540 01/07/22 0324 01/08/22 0531 01/09/22 0320 01/10/22 0256  AST  --   --  31 41  --   ALT  --   --  20 24  --   ALKPHOS  --   --  111 139*  --   BILITOT 6.9* 6.0* 5.3* 4.8* 4.0*  PROT  --   --  5.2* 5.3*  --   ALBUMIN 2.3* 2.2* 2.2* 2.3* 2.3*      Radiology Studies: No results found.    LOS: 14 days    Joycelyn Das, MD [facility] Available via Epic secure chat 7am-7pm After these hours, please refer to coverage provider listed on [email or web address] 01/10/2022, 11:44 AM

## 2022-01-10 NOTE — Progress Notes (Signed)
Subjective:   UOP remains at around 900-  kidney labs pretty stable -  no new c/o's - some soft BPs .  Pts wife Laura [name]  is at bedside ( the one designated to make decisions) she feels like pt is improving " a whole lot"  not sure I have seen the same   Objective Vital signs in last 24 hours: Vitals:   01/09/22 2043 01/10/22 0100 01/10/22 0500 01/10/22 0715  BP: 95/70 101/67 101/68 128/82  Pulse: 60 67 67 65  Resp: 12  17 20   Temp: 98.2 F (36.8 C) 98.4 F (36.9 C) 98.5 F (36.9 C) 97.6 F (36.4 C)  TempSrc: Oral Oral Oral Axillary  SpO2: 100%  100% 99%  Weight:      Height:       Weight change:   Intake/Output Summary (Last 24 hours) at 01/10/2022 0813 Last data filed at 01/10/2022 0100 Gross per 24 hour  Intake 952.29 ml  Output 1130 ml  Net -177.71 ml    Assessment/ Plan: Pt is a 61 y.o. yo adult who was admitted on 12/27/2021 with  weakness -  found to have pancytopenia in the setting of known liver dz-  also has developed AKI  Assessment/Plan: 1. Renal-  crt 0.5 in June of 2023-  crt on 9/9 was 2.1---- down to 1.3 on 9/12 but then started to rise slowly  -  up over 3.5 now -  had some hypotension around the time of crt rise.  Differential includes ATN vs HRS.  Hemodynamics have improved but UOP still low-  maybe picking up now ??  Urine sodium 11.  Receiving max medical therapy for HRS-  on  midodrine, octreotide -  crt still rising but very slowly and no absolute indications for HD.  Traditionally dialysis is not recommended for HRS because prognosis is so poor-  thankfully have not had to make that decision yet.  If truly is thriving then again no need for dialysis.  Laura [name] feels that we should do everything to keep him alive so she would be in support of dialysis if needed  2. HTN/volume-   weaned off of levo--- on midodrine.  By Is and Os is 19 liters positive-  seems like did respond a little to lasix -  have started some scheduled with good response.  Will stop labetalol given  low BP and HR 3. Anemia-  received many transfusions since admit-  heme previously involved considering BMBx but now have signed off--  giving ESA 4. Liver dz-  bili is improving -  GI says cirrhosis and put him on lactulose and rifaximine-  meld score 31 est 52% mortality in 3 mos  5. Metabolic acidosis-  stable with oral sodium bicarb   [name]    Labs: Basic Metabolic Panel: Recent Labs  Lab 01/08/22 0531 01/09/22 0320 01/10/22 0256  NA 134* 136 137  K 3.6 3.8 3.5  CL 105 108 108  CO2 17* 18* 18*  GLUCOSE 112* 141* 132*  BUN 81* 87* 91*  CREATININE 3.57* 3.56* 3.53*  CALCIUM 9.2 8.9 9.0  PHOS 4.3 3.7 4.1   Liver Function Tests: Recent Labs  Lab 01/08/22 0531 01/09/22 0320 01/10/22 0256  AST 31 41  --   ALT 20 24  --   ALKPHOS 111 139*  --   BILITOT 5.3* 4.8* 4.0*  PROT 5.2* 5.3*  --   ALBUMIN 2.2* 2.3* 2.3*   No results for input(s): "LIPASE", "AMYLASE" in  the last 168 hours. No results for input(s): "AMMONIA" in the last 168 hours.  CBC: Recent Labs  Lab 01/05/22 0507 01/06/22 0540 01/07/22 0324 01/08/22 0531 01/09/22 0320 01/10/22 0256  WBC 26.9*  26.4* 23.8* 23.5* 18.6* 18.3* 16.8*  NEUTROABS 23.8*  --   --   --   --   --   HGB 7.5*  7.6* 8.0* 8.8* 7.8* 7.8* 7.8*  HCT 21.7*  21.6* 23.3* 27.1* 23.7* 24.7* 25.0*  MCV 95.2  94.3 96.7 99.3 100.9* 101.2* 103.7*  PLT 54*  56*  54* 59* 65* 53* 57* 58*   Cardiac Enzymes: No results for input(s): "CKTOTAL", "CKMB", "CKMBINDEX", "TROPONINI" in the last 168 hours. CBG: Recent Labs  Lab 01/09/22 1613 01/09/22 2042 01/10/22 0031 01/10/22 0436 01/10/22 0746  GLUCAP 131* 114* 119* 112* 144*    Iron Studies: No results for input(s): "IRON", "TIBC", "TRANSFERRIN", "FERRITIN" in the last 72 hours. Studies/Results: No results found. Medications: Infusions:  sodium chloride 10 mL/hr at 01/07/22 2000   feeding supplement (OSMOLITE 1.5 CAL) 1,000 mL (01/09/22 1759)   octreotide  (SANDOSTATIN) 500 mcg in sodium chloride 0.9 % 250 mL (2 mcg/mL) infusion 50 mcg/hr (01/10/22 0802)    Scheduled Medications:  Chlorhexidine Gluconate Cloth  6 each Topical Q0600   cholecalciferol  2,000 Units Per Tube Daily   darbepoetin (ARANESP) injection - NON-DIALYSIS  200 mcg Subcutaneous Q Thu-1800   feeding supplement (PROSource TF20)  60 mL Per Tube BID   folic acid  1 mg Per Tube Daily   furosemide  80 mg Intravenous Q12H   Gerhardt's butt cream   Topical BID   lactulose  10 g Per Tube BID   leptospermum manuka honey  1 Application Topical Daily   metoprolol tartrate  12.5 mg Per Tube BID   midodrine  15 mg Per Tube Q8H   multivitamin  1 tablet Per Tube QHS   mouth rinse  15 mL Mouth Rinse 4 times per day   pantoprazole  40 mg Per Tube Daily   rifaximin  550 mg Per Tube TID   sodium bicarbonate  1,300 mg Per Tube BID   sodium chloride flush  10-40 mL Intracatheter Q12H   thiamine  100 mg Per Tube Daily    have reviewed scheduled and prn medications.  Physical Exam: General: sleeping but able to arouse-  alert, no c/o's-  pretty debilitated  Heart: RRR Lungs: mostly clear Abdomen: soft, non tender Extremities: dep pitting edema -  improved     01/10/2022,8:13 AM  LOS: 14 days

## 2022-01-10 NOTE — Plan of Care (Signed)

## 2022-01-11 DIAGNOSIS — N179 Acute kidney failure, unspecified: Secondary | ICD-10-CM | POA: Diagnosis not present

## 2022-01-11 DIAGNOSIS — D508 Other iron deficiency anemias: Secondary | ICD-10-CM | POA: Diagnosis not present

## 2022-01-11 DIAGNOSIS — L89209 Pressure ulcer of unspecified hip, unspecified stage: Secondary | ICD-10-CM

## 2022-01-11 DIAGNOSIS — K7030 Alcoholic cirrhosis of liver without ascites: Secondary | ICD-10-CM | POA: Diagnosis not present

## 2022-01-11 DIAGNOSIS — K7011 Alcoholic hepatitis with ascites: Secondary | ICD-10-CM | POA: Diagnosis not present

## 2022-01-11 LAB — GLUCOSE, CAPILLARY
Glucose-Capillary: 121 mg/dL — ABNORMAL HIGH (ref 70–99)
Glucose-Capillary: 121 mg/dL — ABNORMAL HIGH (ref 70–99)
Glucose-Capillary: 123 mg/dL — ABNORMAL HIGH (ref 70–99)
Glucose-Capillary: 130 mg/dL — ABNORMAL HIGH (ref 70–99)
Glucose-Capillary: 134 mg/dL — ABNORMAL HIGH (ref 70–99)
Glucose-Capillary: 142 mg/dL — ABNORMAL HIGH (ref 70–99)
Glucose-Capillary: 149 mg/dL — ABNORMAL HIGH (ref 70–99)

## 2022-01-11 LAB — CBC
HCT: 25.6 % — ABNORMAL LOW (ref 36.0–46.0)
Hemoglobin: 8.3 g/dL — ABNORMAL LOW (ref 12.0–15.0)
MCH: 32.9 pg (ref 26.0–34.0)
MCHC: 32.4 g/dL (ref 30.0–36.0)
MCV: 101.6 fL — ABNORMAL HIGH (ref 80.0–100.0)
Platelets: 65 10*3/uL — ABNORMAL LOW (ref 150–400)
RBC: 2.52 MIL/uL — ABNORMAL LOW (ref 3.87–5.11)
RDW: 28.5 % — ABNORMAL HIGH (ref 11.5–15.5)
WBC: 14.2 10*3/uL — ABNORMAL HIGH (ref 4.0–10.5)
nRBC: 0.1 % (ref 0.0–0.2)

## 2022-01-11 LAB — BILIRUBIN, FRACTIONATED(TOT/DIR/INDIR)
Bilirubin, Direct: 1.7 mg/dL — ABNORMAL HIGH (ref 0.0–0.2)
Indirect Bilirubin: 1.8 mg/dL — ABNORMAL HIGH (ref 0.3–0.9)
Total Bilirubin: 3.5 mg/dL — ABNORMAL HIGH (ref 0.3–1.2)

## 2022-01-11 LAB — RENAL FUNCTION PANEL
Albumin: 2.3 g/dL — ABNORMAL LOW (ref 3.5–5.0)
Anion gap: 11 (ref 5–15)
BUN: 99 mg/dL — ABNORMAL HIGH (ref 6–20)
CO2: 18 mmol/L — ABNORMAL LOW (ref 22–32)
Calcium: 9 mg/dL (ref 8.9–10.3)
Chloride: 110 mmol/L (ref 98–111)
Creatinine, Ser: 3.51 mg/dL — ABNORMAL HIGH (ref 0.44–1.00)
GFR, Estimated: 14 mL/min — ABNORMAL LOW (ref >60)
Glucose, Bld: 121 mg/dL — ABNORMAL HIGH (ref 70–99)
Phosphorus: 3.3 mg/dL (ref 2.5–4.6)
Potassium: 3.6 mmol/L (ref 3.5–5.1)
Sodium: 139 mmol/L (ref 135–145)

## 2022-01-11 NOTE — Progress Notes (Signed)
Leaking flexiseal, replaced with new one, bulb inflated with 45 ml of NS.

## 2022-01-11 NOTE — Plan of Care (Signed)
  Problem: Education: Goal: Knowledge of General Education information will improve Description: Including pain rating scale, medication(s)/side effects and non-pharmacologic comfort measures Outcome: Progressing   Problem: Clinical Measurements: Goal: Will remain free from infection Outcome: Progressing   Problem: Activity: Goal: Risk for activity intolerance will decrease Outcome: Progressing   Problem: Nutrition: Goal: Adequate nutrition will be maintained Outcome: Progressing   Problem: Elimination: Goal: Will not experience complications related to bowel motility Outcome: Progressing   

## 2022-01-11 NOTE — Progress Notes (Signed)
PROGRESS NOTE    Laura [name]  ZTI:[id number] DOB: 10/01/1960 DOA: 12/27/2021 PCP: Laura [name]    Brief Narrative:  Patient is a 61 years old  with past medical history of symptomatic anemia, hypertension, SVT, chronic back pain, tobacco use disorder patient hospital with progressive weakness and fall while attempting to go to the bathroom.  Patient was on the floor for at least 1 day.  EMS found the patient to be very hypotensive in SVT.  Patient identifies as female and lives in an apartment and cousin assist him with medical care at home.  History of alcohol and smoking.  Patient does have episodes of incontinence at baseline.  In the ED, patient was noted to have recurrent SVT with hypotension.  Labs showed hypokalemia at 3.1 and creatinine was elevated at 2.1.  Albumin 2.3.  Ammonia was elevated at 60.  AST ALT mildly elevated.  Lactate was elevated at 4.0.  WBC at 5.5 and platelets at 77.  Chest x-ray was negative for COVID was negative as well.  Patient was then initially admitted to the ICU service for acute alcoholic hepatitis, pancytopenia with possible hepatorenal syndrome.  Patient was then transferred out of the ICU.  Significant Hospital events.  9/9 Admit s/p mechanical fall initial Hgb 5.0 > 2 U pRBC-Vancomycin and Cefepime started 9/11 Hgb 6.8 > 1U pRBC; Bcx 1 NG 2 days-pulled off vancomycin 9/12 1/2 Bcx grew staphylococcus species, possible contaminant 9/13 continued thrombocytopenia, hem onc consulted. Worsening encephalopathy-EEG severe encephalopathy/cefepime toxicity, CT head no acute issues, stopped cefepime switched to CTX 9/14 Increasing WBC-repeat Bcx, restart vancomycin and continue CTX-Bcx Staphylococcus warneri 1/2 likely contaminant, Cortrak placed, Albumin x 2, MAP goal of 80-85 possible hepatorenal 9/15 levophed, albumin and octreotide added for potential hepatorenal syndrome 9/16 NGT placed for N/V 9/17 Nephrology consult, recs: Continue with biopsy if  clinically relevant, continue with rifaximin and Lactulose for hepatic encephalopathy 9/21.  GI has signed off.  No recommendation for liver biopsy.  Assessment and Plan: Principal Problem:   Anemia Active Problems:   Hyperbilirubinemia   Pressure injury of skin   Alcoholic cirrhosis of liver without ascites (HCC)   Alcoholic hepatitis with ascites   AKI (acute kidney injury) (HCC)   Thrombocytopenia (HCC)   Acute alcoholic hepatitis with possible hepatorenal syndrome Anion Gap Metabolic Acidosis GI followed the patient during hospitalization.  Likely acute alcoholic hepatitis with hepatorenal syndrome.  On midodrine and octreotide.  Status post albumin challenge 9/17.  Minimal ascites on ultrasound 9/16.  Continue rifaximin and lactulose. Creatinine at 3.5 <3.5 <3.5<3.3 < 3.3< 3.2 and 2.9.  On high-dose Lasix 80 mg IV twice daily. ANA and Anti-mitochondrial labs within normal limits.  Nephrology on board.  No indication for hemodialysis yet.  On sodium bicarb for metabolic acidosis.    Patient is positive fluid balance for 82505 mL at 1400 mL output..   Cytopenia Hematology followed the patient during hospitalization.  Has signed off at this time.  DIC panel negative.  Has thrombocytopenia and mild leukocytosis at this time.    Had received 2 units of packed RBC initially.  Hemoglobin globin today at 8.3 we will continue to monitor.  Acute metabolic encephalopathy secondary to to cefepime toxicity, mild hyperammonemia, multiorgan failure Cefepime discontinued based on EEG results and transitioned to ceftriaxone 9/13, completed on 9/15. CT head without acute issues.    Received thiamine.  Continue oral lactulose and rifaximin.  On fentanyl as needed pain due to renal function.??  Ileus  N/V Tolerating tube feeding..  Improved  Sepsis 2/2 Urinary Source or Skin Wounds   Blood culture showed Staphylococcus warneri in 1/2 likely contaminant.  Patient received 7 days of cefepime/ceftriaxone  completed on 9/15. Blood culture 9/14 negative to date.    Supraventricular tachycardia. Improved.  On low-dose metoprolol.  Monitor electrolytes.  Magnesium goal more than 2 and potassium goal more than 4  Adult Failure to Thrive  Patient wishes Laura [name] [phone] to make decisions for him and his goddaughter. Family meeting held 9/16.  Palliative care on board for ongoing goals of care discussion.  Deep tissue injury buttocks, perineum, sacrum.  Present on admission -wound care management  Debility, deconditioning.  Patient will likely need skilled nursing facility.    DVT prophylaxis: Place and maintain sequential compression device Start: 12/27/21 1918 SCDs Start: 12/27/21 1828   Code Status:     Code Status: Full Code  Disposition: Uncertain at this time.  Status is: Inpatient  Remains inpatient appropriate because: Pending clinical improvement, multiple organ dysfunction, renal failure, overall prognosis is be poor.   Family Communication:  I again spoke with the patient's wife at bedside on 01/11/2022. Marland Kitchen  Consultants:  Critical care  Nephrology   palliative care GI  Procedures:  RBC transfusion Cortrak tube tube insertion NG tube insertion Foley catheter placement. Rectal tube placement. PICC line placement.  Antimicrobials:  Rifaximin  Subjective: Today, patient was seen and examined at bedside.  No interval complaints reported.  Mildly Communicative, oriented to place.   Objective: Vitals:   01/11/22 0117 01/11/22 0302 01/11/22 0545 01/11/22 0755  BP: 97/65  (!) 111/59 107/79  Pulse:   65 73  Resp: 18  18 (!) 21  Temp:   98.2 F (36.8 C) 98.5 F (36.9 C)  TempSrc:   Oral Axillary  SpO2:  99% 99% 99%  Weight:      Height:        Intake/Output Summary (Last 24 hours) at 01/11/2022 0821 Last data filed at 01/11/2022 0556 Gross per 24 hour  Intake 610 ml  Output 1400 ml  Net -790 ml    Filed Weights   01/06/22 0500 01/07/22 0325  01/09/22 0540  Weight: 76.7 kg 80.5 kg 84.7 kg    Physical Examination: Body mass index is 29.25 kg/m.   General:  Average built, not in obvious distress, older than stated age, deconditioned, due to place, mildly somnolent HENT:   No scleral pallor or icterus noted. Oral mucosa is moist.  Cortrak tube tube in place Chest: Diminished breath sounds bilaterally. No crackles or wheezes.  CVS: S1 &S2 heard. No murmur.  Regular rate and rhythm. Abdomen: Soft, nontender, stented abdomen,.  Bowel sounds are heard.  Rectal tube in place, Foley catheter in place Extremities: No cyanosis, clubbing or edema.  Peripheral pulses are palpable.  Left upper extremity PICC line in place Psych: Mildly somnolent, oriented to place, CNS: Moves all extremities, Skin: Warm and dry.  No rashes noted.  Deep tissue injury over the left buttocks right buttocks, left hip   Data Reviewed:   CBC: Recent Labs  Lab 01/05/22 0507 01/06/22 0540 01/07/22 0324 01/08/22 0531 01/09/22 0320 01/10/22 0256 01/11/22 0147  WBC 26.9*  26.4*   < > 23.5* 18.6* 18.3* 16.8* 14.2*  NEUTROABS 23.8*  --   --   --   --   --   --   HGB 7.5*  7.6*   < > 8.8* 7.8* 7.8* 7.8* 8.3*  HCT 21.7*  21.6*   < > 27.1* 23.7* 24.7* 25.0* 25.6*  MCV 95.2  94.3   < > 99.3 100.9* 101.2* 103.7* 101.6*  PLT 54*  56*  54*   < > 65* 53* 57* 58* 65*   < > = values in this interval not displayed.     Basic Metabolic Panel: Recent Labs  Lab 01/06/22 0540 01/06/22 1446 01/07/22 0324 01/07/22 1855 01/08/22 0531 01/09/22 0320 01/10/22 0256 01/11/22 0147  NA 135   < > 135 135 134* 136 137 139  K 3.6   < > 3.7 3.7 3.6 3.8 3.5 3.6  CL 105   < > 106 110 105 108 108 110  CO2 17*   < > 16* 14* 17* 18* 18* 18*  GLUCOSE 146*   < > 122* 98 112* 141* 132* 121*  BUN 69*   < > 75* 76* 81* 87* 91* 99*  CREATININE 3.25*   < > 3.30* 3.38* 3.57* 3.56* 3.53* 3.51*  CALCIUM 8.7*   < > 8.7* 8.4* 9.2 8.9 9.0 9.0  MG 2.3  --   --  2.2 2.4 2.1 2.0   --   PHOS 3.8  --  3.7  --  4.3 3.7 4.1 3.3   < > = values in this interval not displayed.     Liver Function Tests: Recent Labs  Lab 01/07/22 0324 01/08/22 0531 01/09/22 0320 01/10/22 0256 01/11/22 0147  AST  --  31 41  --   --   ALT  --  20 24  --   --   ALKPHOS  --  111 139*  --   --   BILITOT 6.0* 5.3* 4.8* 4.0* 3.5*  PROT  --  5.2* 5.3*  --   --   ALBUMIN 2.2* 2.2* 2.3* 2.3* 2.3*      Radiology Studies: No results found.    LOS: 15 days    Flora Lipps, MD [facility] Available via Epic secure chat 7am-7pm After these hours, please refer to coverage provider listed on [email or web address] 01/11/2022, 8:21 AM

## 2022-01-11 NOTE — Progress Notes (Signed)
Subjective:   UOP remains at around 900-  kidney labs pretty stable but BUN climbing on prosource and osmolite -  no new c/o's, no nausea -  not really that talkative - some soft BPs .  Pts wife Bonita Quin  is at bedside ( the one designated to make decisions) she feels like pt is improving " a whole lot"  not sure I have seen the same   Objective Vital signs in last 24 hours: Vitals:   01/11/22 0117 01/11/22 0302 01/11/22 0545 01/11/22 0755  BP: 97/65  (!) 111/59 107/79  Pulse:   65 73  Resp: 18  18 (!) 21  Temp:   98.2 F (36.8 C) 98.5 F (36.9 C)  TempSrc:   Oral Axillary  SpO2:  99% 99% 99%  Weight:      Height:       Weight change:   Intake/Output Summary (Last 24 hours) at 01/11/2022 0854 Last data filed at 01/11/2022 0556 Gross per 24 hour  Intake 610 ml  Output 1400 ml  Net -790 ml    Assessment/ Plan: Pt is a 61 y.o. yo adult who was admitted on 12/27/2021 with  weakness -  found to have pancytopenia in the setting of known liver dz-  also has developed AKI  Assessment/Plan: 1. Renal-  crt 0.5 in June of 2023-  crt on 9/9 was 2.1---- down to 1.3 on 9/12 but then started to rise slowly  -  up to 3.5 now -  had some hypotension around the time of crt rise.  Differential includes ATN vs HRS.  Hemodynamics have improved-  UOP adequate on lasix-   Urine sodium 11.  Receiving max medical therapy for HRS-  on  midodrine, octreotide -  crt stable-  BUN rising but very slowly and no absolute indications for HD-  feel is due to high protein TF.  Traditionally dialysis is not recommended for HRS because prognosis is so poor-  thankfully have not had to make that decision yet.  If truly is thriving then again no need for dialysis.  Bonita Quin feels that we should do everything to keep him alive so she would be in support of dialysis if needed  2. HTN/volume-   weaned off of levo--- on midodrine.  By Is and Os was 19 liters positive-   did respond a little to lasix -  have started some scheduled with  good response.  stopped labetalol given low BP and HR 3. Anemia-  received many transfusions since admit-  heme previously involved considering BMBx but now have signed off--  giving ESA-  stable to trending up 4. Liver dz-  bili is improving -  GI says cirrhosis and put him on lactulose and rifaximine-  meld score 31 est 52% mortality in 3 mos  5. Metabolic acidosis-  stable with oral sodium bicarb   Cecille Aver    Labs: Basic Metabolic Panel: Recent Labs  Lab 01/09/22 0320 01/10/22 0256 01/11/22 0147  NA 136 137 139  K 3.8 3.5 3.6  CL 108 108 110  CO2 18* 18* 18*  GLUCOSE 141* 132* 121*  BUN 87* 91* 99*  CREATININE 3.56* 3.53* 3.51*  CALCIUM 8.9 9.0 9.0  PHOS 3.7 4.1 3.3   Liver Function Tests: Recent Labs  Lab 01/08/22 0531 01/09/22 0320 01/10/22 0256 01/11/22 0147  AST 31 41  --   --   ALT 20 24  --   --   ALKPHOS 111 139*  --   --  BILITOT 5.3* 4.8* 4.0* 3.5*  PROT 5.2* 5.3*  --   --   ALBUMIN 2.2* 2.3* 2.3* 2.3*   No results for input(s): "LIPASE", "AMYLASE" in the last 168 hours. No results for input(s): "AMMONIA" in the last 168 hours.  CBC: Recent Labs  Lab 01/05/22 0507 01/06/22 0540 01/07/22 0324 01/08/22 0531 01/09/22 0320 01/10/22 0256 01/11/22 0147  WBC 26.9*  26.4*   < > 23.5* 18.6* 18.3* 16.8* 14.2*  NEUTROABS 23.8*  --   --   --   --   --   --   HGB 7.5*  7.6*   < > 8.8* 7.8* 7.8* 7.8* 8.3*  HCT 21.7*  21.6*   < > 27.1* 23.7* 24.7* 25.0* 25.6*  MCV 95.2  94.3   < > 99.3 100.9* 101.2* 103.7* 101.6*  PLT 54*  56*  54*   < > 65* 53* 57* 58* 65*   < > = values in this interval not displayed.   Cardiac Enzymes: No results for input(s): "CKTOTAL", "CKMB", "CKMBINDEX", "TROPONINI" in the last 168 hours. CBG: Recent Labs  Lab 01/10/22 1205 01/10/22 2033 01/10/22 2340 01/11/22 0341 01/11/22 0445  GLUCAP 131* 132* 151* 121* 130*    Iron Studies: No results for input(s): "IRON", "TIBC", "TRANSFERRIN", "FERRITIN" in the  last 72 hours. Studies/Results: No results found. Medications: Infusions:  sodium chloride 10 mL/hr at 01/07/22 2000   feeding supplement (OSMOLITE 1.5 CAL) 1,000 mL (01/09/22 1759)   octreotide (SANDOSTATIN) 500 mcg in sodium chloride 0.9 % 250 mL (2 mcg/mL) infusion 50 mcg/hr (01/11/22 0548)    Scheduled Medications:  Chlorhexidine Gluconate Cloth  6 each Topical Q0600   cholecalciferol  2,000 Units Per Tube Daily   darbepoetin (ARANESP) injection - NON-DIALYSIS  200 mcg Subcutaneous Q Thu-1800   feeding supplement (PROSource TF20)  60 mL Per Tube BID   folic acid  1 mg Per Tube Daily   furosemide  80 mg Intravenous Q12H   Gerhardt's butt cream   Topical BID   lactulose  10 g Per Tube BID   leptospermum manuka honey  1 Application Topical Daily   midodrine  15 mg Per Tube Q8H   multivitamin  1 tablet Per Tube QHS   mouth rinse  15 mL Mouth Rinse 4 times per day   pantoprazole  40 mg Per Tube Daily   rifaximin  550 mg Per Tube TID   sodium bicarbonate  1,300 mg Per Tube BID   sodium chloride flush  10-40 mL Intracatheter Q12H   thiamine  100 mg Per Tube Daily    have reviewed scheduled and prn medications.  Physical Exam: General: more alert today -  slow to respond to questions but does respond-  pretty debilitated  Heart: RRR Lungs: mostly clear Abdomen: soft, non tender Extremities: dep pitting edema -  improved     01/11/2022,8:54 AM  LOS: 15 days

## 2022-01-11 NOTE — Plan of Care (Signed)

## 2022-01-12 ENCOUNTER — Telehealth (HOSPITAL_COMMUNITY): Payer: Self-pay | Admitting: Pharmacy Technician

## 2022-01-12 ENCOUNTER — Other Ambulatory Visit (HOSPITAL_COMMUNITY): Payer: Self-pay

## 2022-01-12 DIAGNOSIS — Z515 Encounter for palliative care: Secondary | ICD-10-CM | POA: Diagnosis not present

## 2022-01-12 DIAGNOSIS — D508 Other iron deficiency anemias: Secondary | ICD-10-CM | POA: Diagnosis not present

## 2022-01-12 DIAGNOSIS — K7011 Alcoholic hepatitis with ascites: Secondary | ICD-10-CM | POA: Diagnosis not present

## 2022-01-12 DIAGNOSIS — G9340 Encephalopathy, unspecified: Secondary | ICD-10-CM

## 2022-01-12 DIAGNOSIS — Z7189 Other specified counseling: Secondary | ICD-10-CM | POA: Diagnosis not present

## 2022-01-12 DIAGNOSIS — K7030 Alcoholic cirrhosis of liver without ascites: Secondary | ICD-10-CM | POA: Diagnosis not present

## 2022-01-12 DIAGNOSIS — N179 Acute kidney failure, unspecified: Secondary | ICD-10-CM | POA: Diagnosis not present

## 2022-01-12 LAB — RENAL FUNCTION PANEL
Albumin: 2.3 g/dL — ABNORMAL LOW (ref 3.5–5.0)
Anion gap: 13 (ref 5–15)
BUN: 97 mg/dL — ABNORMAL HIGH (ref 6–20)
CO2: 20 mmol/L — ABNORMAL LOW (ref 22–32)
Calcium: 9.5 mg/dL (ref 8.9–10.3)
Chloride: 110 mmol/L (ref 98–111)
Creatinine, Ser: 3.1 mg/dL — ABNORMAL HIGH (ref 0.44–1.00)
GFR, Estimated: 17 mL/min — ABNORMAL LOW (ref >60)
Glucose, Bld: 147 mg/dL — ABNORMAL HIGH (ref 70–99)
Phosphorus: 3 mg/dL (ref 2.5–4.6)
Potassium: 3.3 mmol/L — ABNORMAL LOW (ref 3.5–5.1)
Sodium: 143 mmol/L (ref 135–145)

## 2022-01-12 LAB — GLUCOSE, CAPILLARY
Glucose-Capillary: 145 mg/dL — ABNORMAL HIGH (ref 70–99)
Glucose-Capillary: 140 mg/dL — ABNORMAL HIGH (ref 70–99)
Glucose-Capillary: 111 mg/dL — ABNORMAL HIGH (ref 70–99)
Glucose-Capillary: 133 mg/dL — ABNORMAL HIGH (ref 70–99)
Glucose-Capillary: 113 mg/dL — ABNORMAL HIGH (ref 70–99)
Glucose-Capillary: 131 mg/dL — ABNORMAL HIGH (ref 70–99)

## 2022-01-12 LAB — BILIRUBIN, FRACTIONATED(TOT/DIR/INDIR)
Bilirubin, Direct: 2.2 mg/dL — ABNORMAL HIGH (ref 0.0–0.2)
Indirect Bilirubin: 1.8 mg/dL — ABNORMAL HIGH (ref 0.3–0.9)
Total Bilirubin: 4 mg/dL — ABNORMAL HIGH (ref 0.3–1.2)

## 2022-01-12 MED ORDER — POTASSIUM CHLORIDE 20 MEQ PO PACK
40.0000 meq | PACK | Freq: Two times a day (BID) | ORAL | Status: AC
  Administered 2022-01-12 (×2): 40 meq
  Filled 2022-01-12 (×2): qty 2

## 2022-01-12 MED ORDER — METOPROLOL TARTRATE 12.5 MG HALF TABLET
12.5000 mg | ORAL_TABLET | Freq: Two times a day (BID) | ORAL | Status: DC
  Administered 2022-01-12 – 2022-01-22 (×15): 12.5 mg via ORAL
  Filled 2022-01-12 (×19): qty 1

## 2022-01-12 NOTE — TOC Benefit Eligibility Note (Signed)
Patient Teacher, English as a foreign language completed.    The patient is currently admitted and upon discharge could be taking Octreotide Acetate 500 mg/ml SOSY.  Requires Prior Authorization  The patient is currently admitted and upon discharge could be taking Xifaxan 550 mg tablets.  Requires Prior Authorization  The patient is insured through [place], [place] Patient Advocate Specialist [place] Patient Advocate Team Direct Number: [phone]  Fax: [phone]

## 2022-01-12 NOTE — Care Management Important Message (Signed)
Important Message  Patient Details  Name: Laura [name] MRN: [id number] Date of Birth: 06/22/1960   Medicare Important Message Given:  Yes     Hannah Beat 01/12/2022, 3:39 PM

## 2022-01-12 NOTE — Progress Notes (Signed)
Patient ID: Laura [name], adult   DOB: 07-11-1960, 61 y.o.   MRN: [id number]    Progress Note from the Palliative Medicine Team at [facility]   Patient Name: Laura [name]        Date: 01/12/2022 DOB: [birth year]  Age: 61 y.o. MRN#: [id number] Attending Physician: Flora Lipps, MD Primary Care Physician: Lauree Chandler, NP Admit Date: 12/27/2021   Medical records reviewed, discussed with attending team, assessed the patient.   Laura [name]" ) [name] is a 61 y.o. adult who identifies as female with PMH of chronic back pain, alcohol abuse, anemia, HTN, and HLD who presented to [facility] ED on 12/27/2021 with reports of weakness and fall. Initial labs revealed severe anemia, elevated bilirubin, and elevated ammonia. Admitted to PCCM with septic shock with unclear source. Also with symptomatic anemia, coagulopathy, SVT/hypotension, AKI, mild rhabdomyolysis, and failure to thrive.   Initial palliative consult 12/30/2021  This NP assessed patient at the bedside as a follow up palliative medicine needs and emotional support.  Patient is lethargic and unable to follow commands currently.  He does not have decision-making capacity at this time.  Pate tin is lethargic and oriented only to self.   Spoke to patient's son/[name] by telephone.    Education offered on the seriousness of the patient's current medical situation and the high risk for decompensation.  Long-term prognosis is tenuous.  [name] understands the seriousness of the patient's current medical situation, he is concerned about the future care needs.   Education offered today to Estrellita Ludwig, regarding  the importance of continued conversation with the  medical providers regarding overall plan of care and treatment options,  ensuring decisions are within the context of the patients values and GOCs.  Plan is for family meeting on Wednesday morning at 10:00 am  at bedside along with SO/[name].   PMT will continue to support holistically     Discussed  with treatment team   Questions and concerns addressed     Wadie Lessen NP  Palliative Medicine Team Team Phone # [phone] Pager [phone]

## 2022-01-12 NOTE — Plan of Care (Addendum)
9/25 1056 AM After turning patient, patient had episode of tachycardia, sustained in the 130s. MD was notified thru amnion and secure chat.  Obtained EKG, got orders for low dose metoprolol and to continue midodrine.  MD notified of EKG in chart. After approximately 2 hours heart rate was in the 80s.  Problem: Education: Goal: Knowledge of General Education information will improve Description: Including pain rating scale, medication(s)/side effects and non-pharmacologic comfort measures Outcome: Progressing   Problem: Health Behavior/Discharge Planning: Goal: Ability to manage health-related needs will improve Outcome: Progressing   Problem: Nutrition: Goal: Adequate nutrition will be maintained Outcome: Progressing   Problem: Elimination: Goal: Will not experience complications related to bowel motility Outcome: Progressing Goal: Will not experience complications related to urinary retention Outcome: Progressing

## 2022-01-12 NOTE — Progress Notes (Signed)
Subjective:    SCr improved to 3.1 from 3.5, K 3.3 UOP up to 1.6L No interval other issues  Objective Vital signs in last 24 hours: Vitals:   01/11/22 0755 01/11/22 1953 01/12/22 0500 01/12/22 0900  BP: 107/79 (!) 86/72 129/75 (!) 119/56  Pulse: 73 72 70 78  Resp: (!) 21 17 13 14   Temp: 98.5 F (36.9 C) 98.6 F (37 C)  98.4 F (36.9 C)  TempSrc: Axillary Axillary Axillary Axillary  SpO2: 99% 99% 100% 100%  Weight:      Height:       Weight change:   Intake/Output Summary (Last 24 hours) at 01/12/2022 1139 Last data filed at 01/12/2022 0500 Gross per 24 hour  Intake 174.58 ml  Output 1600 ml  Net -1425.42 ml      Assessment/Plan: 1. Renal-  AKI 2/2 ATN and/or HRS crt 0.5 in June of 2023-  crt on 9/9 was 2.1---- down to 1.3 on 9/12 but then started to rise slowly  -   Today perhaps beginning to improve and UOP is much better; both good news Rec therapy for HRS octreotide and midodrine Improving, no RRT indications 2. HTN/volume-   weaned off of levo--- on midodrine.  Cnt lasix for UOP 3. Anemia-  Hb stable of late, CTM 4. Liver dz-  bili is improving -  GI says cirrhosis and put him on lactulose and rifaximine-  meld score 31 est 52% mortality in 3 mos  5. Metabolic acidosis-  stable with oral sodium bicarb   11/12    Labs: Basic Metabolic Panel: Recent Labs  Lab 01/10/22 0256 01/11/22 0147 01/12/22 0359  NA 137 139 143  K 3.5 3.6 3.3*  CL 108 110 110  CO2 18* 18* 20*  GLUCOSE 132* 121* 147*  BUN 91* 99* 97*  CREATININE 3.53* 3.51* 3.10*  CALCIUM 9.0 9.0 9.5  PHOS 4.1 3.3 3.0    Liver Function Tests: Recent Labs  Lab 01/08/22 0531 01/09/22 0320 01/10/22 0256 01/11/22 0147 01/12/22 0359  AST 31 41  --   --   --   ALT 20 24  --   --   --   ALKPHOS 111 139*  --   --   --   BILITOT 5.3* 4.8* 4.0* 3.5* 4.0*  PROT 5.2* 5.3*  --   --   --   ALBUMIN 2.2* 2.3* 2.3* 2.3* 2.3*    No results for input(s): "LIPASE", "AMYLASE" in the last 168  hours. No results for input(s): "AMMONIA" in the last 168 hours.  CBC: Recent Labs  Lab 01/07/22 0324 01/08/22 0531 01/09/22 0320 01/10/22 0256 01/11/22 0147  WBC 23.5* 18.6* 18.3* 16.8* 14.2*  HGB 8.8* 7.8* 7.8* 7.8* 8.3*  HCT 27.1* 23.7* 24.7* 25.0* 25.6*  MCV 99.3 100.9* 101.2* 103.7* 101.6*  PLT 65* 53* 57* 58* 65*    Cardiac Enzymes: No results for input(s): "CKTOTAL", "CKMB", "CKMBINDEX", "TROPONINI" in the last 168 hours. CBG: Recent Labs  Lab 01/11/22 1734 01/11/22 2105 01/11/22 2345 01/12/22 0506 01/12/22 0929  GLUCAP 121* 149* 142* 145* 140*     Iron Studies: No results for input(s): "IRON", "TIBC", "TRANSFERRIN", "FERRITIN" in the last 72 hours. Studies/Results: No results found. Medications: Infusions:  sodium chloride 10 mL/hr at 01/07/22 2000   feeding supplement (OSMOLITE 1.5 CAL) 1,000 mL (01/11/22 1101)   octreotide (SANDOSTATIN) 500 mcg in sodium chloride 0.9 % 250 mL (2 mcg/mL) infusion 50 mcg/hr (01/12/22 0537)    Scheduled Medications:  Chlorhexidine Gluconate Cloth  6 each Topical Q0600   cholecalciferol  2,000 Units Per Tube Daily   darbepoetin (ARANESP) injection - NON-DIALYSIS  200 mcg Subcutaneous Q Thu-1800   feeding supplement (PROSource TF20)  60 mL Per Tube BID   folic acid  1 mg Per Tube Daily   furosemide  80 mg Intravenous Q12H   Gerhardt's butt cream   Topical BID   lactulose  10 g Per Tube BID   leptospermum manuka honey  1 Application Topical Daily   midodrine  15 mg Per Tube Q8H   multivitamin  1 tablet Per Tube QHS   mouth rinse  15 mL Mouth Rinse 4 times per day   pantoprazole  40 mg Per Tube Daily   potassium chloride  40 mEq Per Tube BID   rifaximin  550 mg Per Tube TID   sodium bicarbonate  1,300 mg Per Tube BID   sodium chloride flush  10-40 mL Intracatheter Q12H   thiamine  100 mg Per Tube Daily    have reviewed scheduled and prn medications.  Physical Exam: General: more alert today -  slow to respond to  questions but does respond-  debilitated  Heart: RRR Lungs: mostly clear Abdomen: soft, non tender Extremities: 1+ LEE  01/12/2022,11:39 AM  LOS: 16 days

## 2022-01-12 NOTE — Telephone Encounter (Signed)
Pharmacy Patient Advocate Encounter  Insurance verification completed.    The patient is insured through [employer] Part D   The patient is currently admitted and ran test claims for the following: Octreotide, Xifaxan.  Copays and coinsurance results were relayed to Inpatient clinical team.

## 2022-01-12 NOTE — Evaluation (Signed)
Clinical/Bedside Swallow Evaluation Patient Details  Name: Laura [name] MRN: [id number] Date of Birth: [birth year]  Today's Date: 01/12/2022 Time: SLP Start Time (ACUTE ONLY): 1031 SLP Stop Time (ACUTE ONLY): 1044 SLP Time Calculation (min) (ACUTE ONLY): 13 min  Past Medical History:  Past Medical History:  Diagnosis Date   Acute bronchitis    Cervical high risk HPV (human papillomavirus) test positive 12/2017   Negative subtype 16, 18/45   Chronic back pain    Complication of anesthesia    "didn't wake up"   Cough    Insomnia, unspecified    Other malaise and fatigue    Other specified diseases of blood and blood-forming organs(289.89)    Tobacco use disorder    Past Surgical History:  Past Surgical History:  Procedure Laterality Date   BIOPSY  07/01/2021   Procedure: BIOPSY;  Surgeon: Jeani Hawking, MD;  Location: [facility] ENDOSCOPY;  Service: Gastroenterology;;  EGD and COLON   COLONOSCOPY WITH PROPOFOL N/A 07/01/2021   Procedure: COLONOSCOPY WITH PROPOFOL;  Surgeon: Jeani Hawking, MD;  Location: [facility] ENDOSCOPY;  Service: Gastroenterology;  Laterality: N/A;   ESOPHAGOGASTRODUODENOSCOPY N/A 03/17/2013   Procedure: ESOPHAGOGASTRODUODENOSCOPY (EGD);  Surgeon: Meryl Dare, MD;  Location: [facility] ENDOSCOPY;  Service: Endoscopy;  Laterality: N/A;   ESOPHAGOGASTRODUODENOSCOPY (EGD) WITH PROPOFOL N/A 07/01/2021   Procedure: ESOPHAGOGASTRODUODENOSCOPY (EGD) WITH PROPOFOL;  Surgeon: Jeani Hawking, MD;  Location: [facility] ENDOSCOPY;  Service: Gastroenterology;  Laterality: N/A;   FRACTURE SURGERY     Laminectomy and foraminotomy at L5-S1    12/12/2019   TONSILLECTOMY     HPI:  Pt is a 61 y.o. trans man, with a past medical history significant for hypertension, hyperlipidemia, acute bronchitis, and recurrent severe anemia. Pt presented to ER with altered mental status and unresponsiveness from a fall. Pt denied headache or neck pain upon intake but reported some cough/fatigue. Pt with unremarkable BSE on  9/11, but with cortrak starting 9/14. GI has now signed off.    Assessment / Plan / Recommendation  Clinical Impression  SLP reconsulted after 10 day cortrak use.  GI has now signed off.  Pt presents with a mild oral dysphagia which is at lease in part 2/2 edentulism.  There was prolonged oral phase with regular texture solid, but pt acheived adequate clearance.  Pt exhibited cough x1 following second trial of thin liquid, but no other clinical indicators of pharyngeal dysphagia, including serial straw sips. Pt in some discomfort and tachycardia during session.  RN aware.  Discussed diet preference with pt.  He would like a softer diet for ease of mastication, and is agreeable to mechanical soft solids.  Pt may need to continue cortrak to ensure he can meet his nutritional needs orally.    Recommend mechanical soft diet with thin liquid.  SLP to follow for diet tolerance.  SLP Visit Diagnosis: Dysphagia, unspecified (R13.10)    Aspiration Risk  Mild aspiration risk    Diet Recommendation Dysphagia 3 (Mech soft);Thin liquid   Liquid Administration via: Cup;Straw Medication Administration: Whole meds with liquid Supervision: Staff to assist with self feeding Compensations: Slow rate;Small sips/bites Postural Changes: Seated upright at 90 degrees    Other  Recommendations Oral Care Recommendations: Oral care BID    Recommendations for follow up therapy are one component of a multi-disciplinary discharge planning process, led by the attending physician.  Recommendations may be updated based on patient status, additional functional criteria and insurance authorization.  Follow up Recommendations No SLP follow up  Assistance Recommended at Discharge None  Functional Status Assessment Patient has had a recent decline in their functional status and demonstrates the ability to make significant improvements in function in a reasonable and predictable amount of time.  Frequency and Duration  min 2x/week  2 weeks       Prognosis Prognosis for Safe Diet Advancement: Good      Swallow Study   General HPI: Pt is a 61 y.o. trans man, with a past medical history significant for hypertension, hyperlipidemia, acute bronchitis, and recurrent severe anemia. Pt presented to ER with altered mental status and unresponsiveness from a fall. Pt denied headache or neck pain upon intake but reported some cough/fatigue. Pt with unremarkable BSE on 9/11, but with cortrak starting 9/14. GI has now signed off. Type of Study: Bedside Swallow Evaluation Previous Swallow Assessment: BSE 9/11 Diet Prior to this Study: NPO Temperature Spikes Noted: No Respiratory Status: Nasal cannula History of Recent Intubation: No Behavior/Cognition: Alert;Cooperative Oral Cavity Assessment: Within Functional Limits Oral Care Completed by SLP: No Oral Cavity - Dentition: Dentures, not available;Missing dentition Self-Feeding Abilities: Needs assist Patient Positioning: Upright in bed Baseline Vocal Quality: Normal Volitional Swallow: Unable to elicit    Oral/Motor/Sensory Function Overall Oral Motor/Sensory Function: Mild impairment Facial ROM: Reduced right;Reduced left Lingual ROM: Reduced right;Reduced left Lingual Symmetry: Within Functional Limits Lingual Strength: Reduced Velum:  (uanble to visualize) Mandible:  (Reduced)   Ice Chips Ice chips: Not tested   Thin Liquid Thin Liquid: Impaired Presentation: Straw Pharyngeal  Phase Impairments: Cough - Immediate (x1)    Nectar Thick Nectar Thick Liquid: Not tested   Honey Thick Honey Thick Liquid: Not tested   Puree Puree: Within functional limits Presentation: Spoon   Solid     Solid: Impaired Oral Phase Functional Implications: Prolonged oral transit;Impaired mastication      Laura [name], [place], [place] Office: [phone] 01/12/2022,11:07 AM

## 2022-01-12 NOTE — Progress Notes (Addendum)
PROGRESS NOTE    Laura [name]  TGG:[id number] DOB: 1961-03-04 DOA: 12/27/2021 PCP: Lauree Chandler, NP    Brief Narrative:  Patient is a 61 years old  with past medical history of symptomatic anemia, hypertension, SVT, chronic back pain, tobacco use disorder patient hospital with progressive weakness and fall while attempting to go to the bathroom.  Patient was on the floor for at least 1 day.  EMS found the patient to be very hypotensive in SVT.  Patient identifies as female and lives in an apartment and cousin assist him with medical care at home.  History of alcohol and smoking.  Patient does have episodes of incontinence at baseline.  In the ED, patient was noted to have recurrent SVT with hypotension.  Labs showed hypokalemia at 3.1 and creatinine was elevated at 2.1.  Albumin 2.3.  Ammonia was elevated at 60.  AST ALT mildly elevated.  Lactate was elevated at 4.0.  WBC at 5.5 and platelets at 77.  Chest x-ray was negative for COVID was negative as well.  Patient was then initially admitted to the ICU service for acute alcoholic hepatitis, pancytopenia with possible hepatorenal syndrome.  Patient was then transferred out of the ICU.  Significant Hospital events.  9/9 Admit s/p mechanical fall initial Hgb 5.0 > 2 U pRBC-Vancomycin and Cefepime started 9/11 Hgb 6.8 > 1U pRBC; Bcx 1 NG 2 days-pulled off vancomycin 9/12 1/2 Bcx grew staphylococcus species, possible contaminant 9/13 continued thrombocytopenia, hem onc consulted. Worsening encephalopathy-EEG severe encephalopathy/cefepime toxicity, CT head no acute issues, stopped cefepime switched to CTX 9/14 Increasing WBC-repeat Bcx, restart vancomycin and continue CTX-Bcx Staphylococcus warneri 1/2 likely contaminant, Cortrak placed, Albumin x 2, MAP goal of 80-85 possible hepatorenal 9/15 levophed, albumin and octreotide added for potential hepatorenal syndrome 9/16 NGT placed for N/V 9/17 Nephrology consult, recs: Continue with biopsy if  clinically relevant, continue with rifaximin and Lactulose for hepatic encephalopathy 9/21.  GI has signed off.  No recommendation for liver biopsy.  Assessment and Plan: Principal Problem:   Anemia Active Problems:   Hyperbilirubinemia   Pressure injury of skin   Alcoholic cirrhosis of liver without ascites (HCC)   Alcoholic hepatitis with ascites   AKI (acute kidney injury) (HCC)   Thrombocytopenia (HCC)   Acute alcoholic hepatitis with possible hepatorenal syndrome Anion Gap Metabolic Acidosis GI followed the patient during hospitalization.  Likely acute alcoholic hepatitis with hepatorenal syndrome.  On midodrine and octreotide.  Status post albumin challenge 9/17.  Minimal ascites on ultrasound 9/16.  Continue rifaximin and lactulose. Creatinine at 3.1 today from 3.5 <3.5 <3.5<3.3 < 3.3< 3.2 and 2.9.  On high-dose Lasix 80 mg IV twice daily. ANA and Anti-mitochondrial labs within normal limits.   On sodium bicarb for metabolic acidosis.    Patient is positive fluid balance for 18642 mL at 1400 mL output.  Patient has increased urinary output with improving creatinine.  Follow nephrology recommendation.  Hypokalemia.  Will replace orally.  Check BMP in AM.  Goal is to keep more than 4.   Pancytopenia Hematology followed the patient during hospitalization.  Has signed off at this time.  DIC panel negative.  Has thrombocytopenia and mild leukocytosis at this time.    Had received 2 units of packed RBC initially.  Latest hemoglobin at 8.3 we will continue to monitor.  Acute metabolic encephalopathy secondary to to cefepime toxicity, mild hyperammonemia, multiorgan failure Cefepime discontinued based on EEG results and transitioned to ceftriaxone 9/13, completed on 9/15. CT head without  acute issues.    Received thiamine.  Continue oral lactulose and rifaximin.  On fentanyl as needed pain due to renal function. Seen is more alert awake and communicative today.  Ileus   N/V Resolved.  Sepsis 2/2 Urinary Source or Skin Wounds   Blood culture showed Staphylococcus warneri in 1/2 likely contaminant.  Patient received 7 days of cefepime/ceftriaxone completed on 9/15. Blood culture 9/14 negative to date.    Supraventricular tachycardia. Improved.  Was on low-dose metoprolol but this was discontinued but might need if remains tachycardic.   Magnesium goal more than 2 and potassium goal more than 4  Adult Failure to Thrive  Patient wishes [name] [phone] to make decisions for him and his goddaughter. Family meeting held 9/16.  Palliative care on board for ongoing goals of care discussion.  Dysphagia, failure to thrive.  Patient was on tube feeding.  Speech therapy has seen the patient today and going to dysphagia 2 diet with thin liquids.  We will start the patient on oral diet.  See progress.  Deep tissue injury buttocks, perineum, sacrum.  Present on admission -wound care management  Debility, deconditioning.  Patient will likely need skilled nursing facility.  Goals of care.  Patient has poor overall prognosis.  I have communicated this with patient's wife at bedside.  Palliative care has seen the patient in the past.   DVT prophylaxis: Place and maintain sequential compression device Start: 12/27/21 1918 SCDs Start: 12/27/21 1828   Code Status:     Code Status: Full Code  Disposition: Uncertain at this time.  Status is: Inpatient  Remains inpatient appropriate because: Pending clinical improvement, multiple organ dysfunction, renal failure, overall prognosis is poor.   Family Communication:  I again spoke with the patient's wife at bedside on 01/12/2022. Marland Kitchen  Consultants:  Critical care  Nephrology   palliative care GI  Procedures:  PRBC transfusion Cortrak tube tube insertion NG tube insertion Foley catheter placement. Rectal tube placement. PICC line placement.  Antimicrobials:  Rifaximin  Subjective: Today, patient was  seen and examined at bedside.  Patient is more alert awake and communicative.  Denies any pain nausea vomiting fever.  Nursing staff reported that she was tachycardic when they tried to move.  Patient's wife at bedside..  Objective: Vitals:   01/11/22 0755 01/11/22 1953 01/12/22 0500 01/12/22 0900  BP: 107/79 (!) 86/72 129/75 (!) 119/56  Pulse: 73 72 70 78  Resp: (!) 21 17 13 14   Temp: 98.5 F (36.9 C) 98.6 F (37 C)  98.4 F (36.9 C)  TempSrc: Axillary Axillary Axillary Axillary  SpO2: 99% 99% 100% 100%  Weight:      Height:        Intake/Output Summary (Last 24 hours) at 01/12/2022 1054 Last data filed at 01/12/2022 0500 Gross per 24 hour  Intake 2778.83 ml  Output 1600 ml  Net 1178.83 ml    Filed Weights   01/06/22 0500 01/07/22 0325 01/09/22 0540  Weight: 76.7 kg 80.5 kg 84.7 kg    Physical Examination: Body mass index is 29.25 kg/m.   General:  Average built, not in obvious distress, appears weak and deconditioned older than stated age but little more alert and communicative. HENT:   No scleral pallor or icterus noted. Oral mucosa is moist.  Cortrak tube tube in place. Chest:  .  Diminished breath sounds bilaterally. No crackles or wheezes.  CVS: S1 &S2 heard. No murmur.  Regular rate and rhythm. Abdomen: Soft, nontender, nondistended.  Bowel  sounds are heard.  Rectal tube in place, Foley catheter in place Extremities: No cyanosis, clubbing or edema.  Peripheral pulses are palpable.  Left upper extremity PICC line in place Psych: Alert, awake and Communicative, CNS:  No cranial nerve deficits.  Moves extremities. Skin: Warm and dry.  Diffuse injury over the left buttocks right buttocks and left hip   Data Reviewed:   CBC: Recent Labs  Lab 01/07/22 0324 01/08/22 0531 01/09/22 0320 01/10/22 0256 01/11/22 0147  WBC 23.5* 18.6* 18.3* 16.8* 14.2*  HGB 8.8* 7.8* 7.8* 7.8* 8.3*  HCT 27.1* 23.7* 24.7* 25.0* 25.6*  MCV 99.3 100.9* 101.2* 103.7* 101.6*  PLT 65*  53* 57* 58* 65*     Basic Metabolic Panel: Recent Labs  Lab 01/06/22 0540 01/06/22 1446 01/07/22 1855 01/08/22 0531 01/09/22 0320 01/10/22 0256 01/11/22 0147 01/12/22 0359  NA 135   < > 135 134* 136 137 139 143  K 3.6   < > 3.7 3.6 3.8 3.5 3.6 3.3*  CL 105   < > 110 105 108 108 110 110  CO2 17*   < > 14* 17* 18* 18* 18* 20*  GLUCOSE 146*   < > 98 112* 141* 132* 121* 147*  BUN 69*   < > 76* 81* 87* 91* 99* 97*  CREATININE 3.25*   < > 3.38* 3.57* 3.56* 3.53* 3.51* 3.10*  CALCIUM 8.7*   < > 8.4* 9.2 8.9 9.0 9.0 9.5  MG 2.3  --  2.2 2.4 2.1 2.0  --   --   PHOS 3.8   < >  --  4.3 3.7 4.1 3.3 3.0   < > = values in this interval not displayed.     Liver Function Tests: Recent Labs  Lab 01/08/22 0531 01/09/22 0320 01/10/22 0256 01/11/22 0147 01/12/22 0359  AST 31 41  --   --   --   ALT 20 24  --   --   --   ALKPHOS 111 139*  --   --   --   BILITOT 5.3* 4.8* 4.0* 3.5* 4.0*  PROT 5.2* 5.3*  --   --   --   ALBUMIN 2.2* 2.3* 2.3* 2.3* 2.3*      Radiology Studies: No results found.    LOS: 16 days    Joycelyn Das, MD [facility] Available via Epic secure chat 7am-7pm After these hours, please refer to coverage provider listed on [email or web address] 01/12/2022, 10:54 AM

## 2022-01-13 DIAGNOSIS — K7030 Alcoholic cirrhosis of liver without ascites: Secondary | ICD-10-CM | POA: Diagnosis not present

## 2022-01-13 DIAGNOSIS — D508 Other iron deficiency anemias: Secondary | ICD-10-CM | POA: Diagnosis not present

## 2022-01-13 DIAGNOSIS — N179 Acute kidney failure, unspecified: Secondary | ICD-10-CM | POA: Diagnosis not present

## 2022-01-13 DIAGNOSIS — K7011 Alcoholic hepatitis with ascites: Secondary | ICD-10-CM | POA: Diagnosis not present

## 2022-01-13 LAB — COMPREHENSIVE METABOLIC PANEL
ALT: 50 U/L — ABNORMAL HIGH (ref 0–44)
AST: 75 U/L — ABNORMAL HIGH (ref 15–41)
Albumin: 2.1 g/dL — ABNORMAL LOW (ref 3.5–5.0)
Alkaline Phosphatase: 249 U/L — ABNORMAL HIGH (ref 38–126)
Anion gap: 5 (ref 5–15)
BUN: 98 mg/dL — ABNORMAL HIGH (ref 6–20)
CO2: 21 mmol/L — ABNORMAL LOW (ref 22–32)
Calcium: 8.9 mg/dL (ref 8.9–10.3)
Chloride: 117 mmol/L — ABNORMAL HIGH (ref 98–111)
Creatinine, Ser: 2.73 mg/dL — ABNORMAL HIGH (ref 0.44–1.00)
GFR, Estimated: 19 mL/min — ABNORMAL LOW (ref >60)
Glucose, Bld: 114 mg/dL — ABNORMAL HIGH (ref 70–99)
Potassium: 3.8 mmol/L (ref 3.5–5.1)
Sodium: 143 mmol/L (ref 135–145)
Total Bilirubin: 3.8 mg/dL — ABNORMAL HIGH (ref 0.3–1.2)
Total Protein: 5.6 g/dL — ABNORMAL LOW (ref 6.5–8.1)

## 2022-01-13 LAB — CBC
HCT: 24.4 % — ABNORMAL LOW (ref 36.0–46.0)
Hemoglobin: 7.8 g/dL — ABNORMAL LOW (ref 12.0–15.0)
MCH: 33.2 pg (ref 26.0–34.0)
MCHC: 32 g/dL (ref 30.0–36.0)
MCV: 103.8 fL — ABNORMAL HIGH (ref 80.0–100.0)
Platelets: 89 10*3/uL — ABNORMAL LOW (ref 150–400)
RBC: 2.35 MIL/uL — ABNORMAL LOW (ref 3.87–5.11)
WBC: 12.1 10*3/uL — ABNORMAL HIGH (ref 4.0–10.5)
nRBC: 0 % (ref 0.0–0.2)

## 2022-01-13 LAB — GLUCOSE, CAPILLARY
Glucose-Capillary: 105 mg/dL — ABNORMAL HIGH (ref 70–99)
Glucose-Capillary: 121 mg/dL — ABNORMAL HIGH (ref 70–99)
Glucose-Capillary: 144 mg/dL — ABNORMAL HIGH (ref 70–99)
Glucose-Capillary: 147 mg/dL — ABNORMAL HIGH (ref 70–99)
Glucose-Capillary: 148 mg/dL — ABNORMAL HIGH (ref 70–99)

## 2022-01-13 LAB — PROTIME-INR
INR: 1.3 — ABNORMAL HIGH (ref 0.8–1.2)
Prothrombin Time: 16.1 seconds — ABNORMAL HIGH (ref 11.4–15.2)

## 2022-01-13 LAB — MAGNESIUM: Magnesium: 1.7 mg/dL (ref 1.7–2.4)

## 2022-01-13 NOTE — NC FL2 (Signed)
[place] MEDICAID FL2 LEVEL OF CARE SCREENING TOOL     IDENTIFICATION  Patient Name: Laura [name] Birthdate: 1961-01-01 Sex: adult Admission Date (Current Location): 12/27/2021  [facility] and [place] Number:  Producer, television/film/video and Address:  [facility], [address], [place], [place] [address]      Provider Number: [id number]  Attending Physician Name and Address:  Joycelyn Das, MD  Relative Name and Phone Number:  Minter,Christopher Son   [phone]    Current Level of Care: Hospital Recommended Level of Care: Skilled Nursing Facility Prior Approval Number:    Date Approved/Denied:   PASRR Number:    Discharge Plan: SNF    Current Diagnoses: Patient Active Problem List   Diagnosis Date Noted   Thrombocytopenia (HCC)    AKI (acute kidney injury) (HCC)    Alcoholic hepatitis with ascites    Alcoholic cirrhosis of liver without ascites (HCC)    Pressure injury of skin 12/28/2021   Anemia 12/27/2021   Conjunctivitis 08/01/2021   Vitamin D deficiency 08/01/2021   Erythema of abdominal wall 08/01/2021   Hyperbilirubinemia 07/30/2021   Elevated alkaline phosphatase level 07/30/2021   SVT (supraventricular tachycardia) (HCC) 07/30/2021   Hypocalcemia 07/30/2021   Edema 07/30/2021   UTI (urinary tract infection) 07/30/2021   Folate deficiency 07/30/2021   Exertional shortness of breath    Symptomatic anemia 07/29/2021   Hypotension 06/30/2021   Hypokalemia 06/30/2021   Normocytic anemia 06/30/2021   Hypomagnesemia 06/30/2021   Lactic acidosis 06/29/2021   Estrogen deficiency 09/30/2018   Macrocytic anemia 04/12/2018   Tobacco abuse 04/12/2018   Essential hypertension 04/12/2018   Body mass index (BMI) of 37.0-37.9 in adult 04/12/2018   Obesity (BMI 30.0-34.9) 09/29/2016   Low back pain 10/07/2015   Chronic night sweats 03/03/2015   Generalized anxiety disorder 06/07/2014   Allergic rhinitis 03/02/2013   Panic attack 09/05/2012    Insomnia 07/28/2012   Depression 07/28/2012   OA (osteoarthritis) of knee 07/28/2012    Orientation RESPIRATION BLADDER Height & Weight     Self, Time  Normal Incontinent Weight: 190 lb 11.2 oz (86.5 kg) Height:  5\' 7"  (170.2 cm)  BEHAVIORAL SYMPTOMS/MOOD NEUROLOGICAL BOWEL NUTRITION STATUS      Incontinent Feeding tube (currently coretrack)  AMBULATORY STATUS COMMUNICATION OF NEEDS Skin   Total Care Verbally PU Stage and Appropriate Care                       Personal Care Assistance Level of Assistance  Bathing, Feeding, Dressing, Total care Bathing Assistance: Maximum assistance Feeding assistance: Limited assistance Dressing Assistance: Maximum assistance Total Care Assistance: Maximum assistance   Functional Limitations Info  Sight, Hearing, Speech Sight Info: Adequate Hearing Info: Adequate Speech Info: Adequate    SPECIAL CARE FACTORS FREQUENCY  PT (By licensed PT), OT (By licensed OT)     PT Frequency: 5x week OT Frequency: 5x week            Contractures Contractures Info: Not present    Additional Factors Info  Code Status, Allergies Code Status Info: full Allergies Info: Garlic, Aspirin, Lyrica (Pregabalin), Penicillins           Current Medications (01/13/2022):  This is the current hospital active medication list Current Facility-Administered Medications  Medication Dose Route Frequency Provider Last Rate Last Admin   0.9 %  sodium chloride infusion  250 mL Intravenous Continuous 01/15/2022, PA-C 10 mL/hr at 01/07/22 2000 Infusion Verify  at 01/07/22 2000   Chlorhexidine Gluconate Cloth 2 % PADS 6 each  6 each Topical Q0600 Olalere, Adewale A, MD   6 each at 01/13/22 1312   cholecalciferol (VITAMIN D3) 25 MCG (1000 UNIT) tablet 2,000 Units  2,000 Units Per Tube Daily Pokhrel, Laxman, MD   2,000 Units at 01/13/22 0954   Darbepoetin Alfa (ARANESP) injection 200 mcg  200 mcg Subcutaneous Q Thu-1800 Annie Sable, MD   200 mcg at 01/08/22  1700   docusate (COLACE) 50 MG/5ML liquid 100 mg  100 mg Per Tube BID PRN Luciano Cutter, MD       feeding supplement (OSMOLITE 1.5 CAL) liquid 1,000 mL  1,000 mL Per Tube Continuous Pokhrel, Laxman, MD 45 mL/hr at 01/12/22 1425 1,000 mL at 01/12/22 1425   feeding supplement (PROSource TF20) liquid 60 mL  60 mL Per Tube BID Luciano Cutter, MD   60 mL at 01/13/22 0954   fentaNYL (SUBLIMAZE) injection 25 mcg  25 mcg Intravenous Q2H PRN Selmer Dominion B, NP       folic acid (FOLVITE) tablet 1 mg  1 mg Per Tube Daily Rockwell Alexandria, RPH   1 mg at 01/13/22 0954   furosemide (LASIX) injection 80 mg  80 mg Intravenous Q12H Annie Sable, MD   80 mg at 01/13/22 0165   Gerhardt's butt cream   Topical TID PRN Luciano Cutter, MD   1 Application at 01/07/22 0130   Gerhardt's butt cream   Topical BID Luciano Cutter, MD   Given at 01/13/22 1313   lactulose (CHRONULAC) 10 GM/15ML solution 10 g  10 g Per Tube BID Luciano Cutter, MD   10 g at 01/13/22 0954   leptospermum manuka honey (MEDIHONEY) paste 1 Application  1 Application Topical Daily Luciano Cutter, MD   1 Application at 01/13/22 1313   metoprolol tartrate (LOPRESSOR) tablet 12.5 mg  12.5 mg Oral BID Pokhrel, Laxman, MD   12.5 mg at 01/13/22 0955   midodrine (PROAMATINE) tablet 15 mg  15 mg Per Tube Q8H Luciano Cutter, MD   15 mg at 01/13/22 1314   multivitamin (RENA-VIT) tablet 1 tablet  1 tablet Per Tube QHS Leander Rams, RPH   1 tablet at 01/12/22 2111   octreotide (SANDOSTATIN) 500 mcg in sodium chloride 0.9 % 250 mL (2 mcg/mL) infusion  50 mcg/hr Intravenous Continuous Martina Sinner, MD 25 mL/hr at 01/13/22 0249 50 mcg/hr at 01/13/22 0249   ondansetron (ZOFRAN) injection 4 mg  4 mg Intravenous Q6H PRN Martina Sinner, MD   4 mg at 01/03/22 1200   Oral care mouth rinse  15 mL Mouth Rinse 4 times per day Martina Sinner, MD   15 mL at 01/13/22 1313   Oral care mouth rinse  15 mL Mouth Rinse PRN Martina Sinner, MD       pantoprazole (PROTONIX) 2 mg/mL oral suspension 40 mg  40 mg Per Tube Daily Francena Hanly, RPH   40 mg at 01/13/22 0954   polyethylene glycol (MIRALAX / GLYCOLAX) packet 17 g  17 g Oral Daily PRN Olalere, Adewale A, MD       rifaximin (XIFAXAN) tablet 550 mg  550 mg Per Tube TID Tressia Danas, MD   550 mg at 01/13/22 0954   sodium bicarbonate tablet 1,300 mg  1,300 mg Per Tube BID Darnell Level, MD   1,300 mg at 01/13/22 0954   sodium chloride flush (NS)  0.9 % injection 10-40 mL  10-40 mL Intracatheter Q12H Margaretha Seeds, MD   10 mL at 01/12/22 1026   sodium chloride flush (NS) 0.9 % injection 10-40 mL  10-40 mL Intracatheter PRN Margaretha Seeds, MD   10 mL at 01/13/22 0318   thiamine (VITAMIN B1) tablet 100 mg  100 mg Per Tube Daily Jennelle Human B, NP   100 mg at 01/13/22 2395     Discharge Medications: Please see discharge summary for a list of discharge medications.  Relevant Imaging Results:  Relevant Lab Results:   Additional Information SSN: [id number]  Joanne Chars, LCSW

## 2022-01-13 NOTE — Progress Notes (Signed)
Subjective:    SCr improved to 2.7 from 3.1, K 3.8 UOP not reported, but plenty of urine in bag No interval other issues  Objective Vital signs in last 24 hours: Vitals:   01/13/22 0500 01/13/22 0544 01/13/22 0804 01/13/22 0955  BP:  117/65 119/65 (!) 109/92  Pulse:   64 64  Resp:  13    Temp:   97.8 F (36.6 C)   TempSrc:   Oral   SpO2:   100%   Weight: 86.5 kg     Height:       Weight change:   Intake/Output Summary (Last 24 hours) at 01/13/2022 1219 Last data filed at 01/12/2022 2036 Gross per 24 hour  Intake 100 ml  Output --  Net 100 ml      Assessment/Plan: 1. Renal-  AKI 2/2 ATN and/or HRS crt 0.5 in June of 2023-  crt on 9/9 was 2.1---- down to 1.3 on 9/12 but then started to rise slowly  -   Now recovering GFR Rec therapy for HRS octreotide and midodrine Improving, no RRT indications 2. HTN/volume-   weaned off of levo--- on midodrine.  Cont lasix 80 IV BID for UOP 3. Anemia-  Hb stable of late, CTM 4. Liver dz-  bili is improving -  GI says cirrhosis and put him on lactulose and rifaximine-  meld score 31 est 52% mortality in 3 mos  5. Metabolic acidosis-  stable with oral sodium bicarb   Arita Miss    Labs: Basic Metabolic Panel: Recent Labs  Lab 01/10/22 0256 01/11/22 0147 01/12/22 0359 01/13/22 0312  NA 137 139 143 143  K 3.5 3.6 3.3* 3.8  CL 108 110 110 117*  CO2 18* 18* 20* 21*  GLUCOSE 132* 121* 147* 114*  BUN 91* 99* 97* 98*  CREATININE 3.53* 3.51* 3.10* 2.73*  CALCIUM 9.0 9.0 9.5 8.9  PHOS 4.1 3.3 3.0  --     Liver Function Tests: Recent Labs  Lab 01/08/22 0531 01/09/22 0320 01/10/22 0256 01/11/22 0147 01/12/22 0359 01/13/22 0312  AST 31 41  --   --   --  75*  ALT 20 24  --   --   --  50*  ALKPHOS 111 139*  --   --   --  249*  BILITOT 5.3* 4.8*   < > 3.5* 4.0* 3.8*  PROT 5.2* 5.3*  --   --   --  5.6*  ALBUMIN 2.2* 2.3*   < > 2.3* 2.3* 2.1*   < > = values in this interval not displayed.    No results for input(s):  "LIPASE", "AMYLASE" in the last 168 hours. No results for input(s): "AMMONIA" in the last 168 hours.  CBC: Recent Labs  Lab 01/08/22 0531 01/09/22 0320 01/10/22 0256 01/11/22 0147 01/13/22 0312  WBC 18.6* 18.3* 16.8* 14.2* 12.1*  HGB 7.8* 7.8* 7.8* 8.3* 7.8*  HCT 23.7* 24.7* 25.0* 25.6* 24.4*  MCV 100.9* 101.2* 103.7* 101.6* 103.8*  PLT 53* 57* 58* 65* 89*    Cardiac Enzymes: No results for input(s): "CKTOTAL", "CKMB", "CKMBINDEX", "TROPONINI" in the last 168 hours. CBG: Recent Labs  Lab 01/12/22 1636 01/12/22 2034 01/12/22 2323 01/13/22 0403 01/13/22 1152  GLUCAP 133* 113* 131* 105* 121*     Iron Studies: No results for input(s): "IRON", "TIBC", "TRANSFERRIN", "FERRITIN" in the last 72 hours. Studies/Results: No results found. Medications: Infusions:  sodium chloride 10 mL/hr at 01/07/22 2000   feeding supplement (OSMOLITE 1.5 CAL) 1,000  mL (01/12/22 1425)   octreotide (SANDOSTATIN) 500 mcg in sodium chloride 0.9 % 250 mL (2 mcg/mL) infusion 50 mcg/hr (01/13/22 0249)    Scheduled Medications:  Chlorhexidine Gluconate Cloth  6 each Topical Q0600   cholecalciferol  2,000 Units Per Tube Daily   darbepoetin (ARANESP) injection - NON-DIALYSIS  200 mcg Subcutaneous Q Thu-1800   feeding supplement (PROSource TF20)  60 mL Per Tube BID   folic acid  1 mg Per Tube Daily   furosemide  80 mg Intravenous Q12H   Gerhardt's butt cream   Topical BID   lactulose  10 g Per Tube BID   leptospermum manuka honey  1 Application Topical Daily   metoprolol tartrate  12.5 mg Oral BID   midodrine  15 mg Per Tube Q8H   multivitamin  1 tablet Per Tube QHS   mouth rinse  15 mL Mouth Rinse 4 times per day   pantoprazole  40 mg Per Tube Daily   rifaximin  550 mg Per Tube TID   sodium bicarbonate  1,300 mg Per Tube BID   sodium chloride flush  10-40 mL Intracatheter Q12H   thiamine  100 mg Per Tube Daily    have reviewed scheduled and prn medications.  Physical Exam: General: more  alert today -  slow to respond to questions but does respond-  debilitated  Heart: RRR Lungs: mostly clear Abdomen: soft, non tender Extremities: 1+ LEE  01/13/2022,12:19 PM  LOS: 17 days

## 2022-01-13 NOTE — TOC Progression Note (Addendum)
Transition of Care [facility]) - Progression Note    Patient Details  Name: Laura [name] MRN: [id number] Date of Birth: [birth year]  Transition of Care [facility]) CM/SW Contact  Joanne Chars, LCSW Phone Number: 01/13/2022, 3:53 PM  Clinical Narrative:   CSW spoke with pt, introduced self, discussed DC plan recommendation for SNF.  Pt reports her preference is to go home, states she lives with friend Bethena Roys.  Pt identified son [name] as contact, permission given to CSW to speak with [name].  CSW spoke with [name] by phone, he lives in [place], reports pt continues to be confused.  Bethena Roys is a friend from years ago, pt currently lives with [name].  [name] does not think it will work for pt to go home and is in favor of rehab.  He is going to be at the hospital tomorrow and has meeting with the PA.  Permission given to send out SNF referral in hub.   1550: CSW also spoke with Rosaland Lao, identified in MD note as decision maker.  She is also in agreement that pt needs SNF rehab and would like to move forward with this DC plan.     Barriers to Discharge: Continued Medical Work up  Expected Discharge Plan and Services     Discharge Planning Services: CM Consult   Living arrangements for the past 2 months: Apartment                                       Social Determinants of Health (SDOH) Interventions    Readmission Risk Interventions    07/02/2021   11:32 AM  Readmission Risk Prevention Plan  Post Dischage Appt Complete  Medication Screening Complete  Transportation Screening Complete

## 2022-01-13 NOTE — Progress Notes (Signed)
PT Cancellation Note  Patient Details Name: Ronika Kelson MRN: [id number] DOB: [birth year]   Cancelled Treatment:    Reason Eval/Treat Not Completed: Other (comment)  "Why are you coming in this time of night?" At 1446pm;  Introduced self, role of PT, and rationale for moving, getting up;   Ultimately, pt in too much pain to move this afternoon;   Will follow up tomorrow, and will consider [facility] for OOB;   Roney Marion, PT  Acute Rehabilitation Services Office [phone]    Colletta [place] 01/13/2022, 3:06 PM

## 2022-01-13 NOTE — Progress Notes (Signed)
PROGRESS NOTE    Laura [name]  KWI:[id number] DOB: May 07, 1960 DOA: 12/27/2021 PCP: Lauree Chandler, NP    Brief Narrative:   Patient is a 61 years old  with past medical history of symptomatic anemia, hypertension, SVT, chronic back pain, tobacco use disorder patient hospital with progressive weakness and fall while attempting to go to the bathroom.  Patient was on the floor for at least 1 day.  EMS found the patient to be very hypotensive in SVT.  Patient identifies as female and lives in an apartment.  History of alcohol and smoking.  Patient does have episodes of incontinence at baseline.  In the ED, patient was noted to have recurrent SVT with hypotension.  Labs showed hypokalemia at 3.1 and creatinine was elevated at 2.1.  Albumin 2.3.  Ammonia was elevated at 60.  AST ALT mildly elevated.  Lactate was elevated at 4.0.  WBC at 5.5 and platelets at 77.  Chest x-ray was negative for COVID was negative as well.  Patient was then initially admitted to the ICU service for acute alcoholic hepatitis, pancytopenia with possible hepatorenal syndrome.  He was stabilized and  was then transferred out of the ICU.  Significant Hospital events.  9/9 -Admit s/p mechanical fall initial Hgb 5.0 > 2 U pRBC-Vancomycin and Cefepime started 9/11 Hgb 6.8 > 1U pRBC; Bcx 1 NG 2 days-pulled off vancomycin 9/12 1/2 Bcx grew staphylococcus species, possible contaminant 9/13 continued thrombocytopenia, hem onc consulted. Worsening encephalopathy-EEG severe encephalopathy/cefepime toxicity, CT head no acute issues, stopped cefepime switched to CTX 9/14 Increasing WBC-repeat Bcx, restart vancomycin and continue CTX-Bcx Staphylococcus warneri 1/2 likely contaminant, Cortrak placed, Albumin x 2, MAP goal of 80-85 possible hepatorenal 9/15 levophed, albumin and octreotide added for potential hepatorenal syndrome 9/16 NGT placed for N/V 9/17 Nephrology consult, recs: Continue with biopsy if clinically relevant, continue with  rifaximin and Lactulose for hepatic encephalopathy 9/21.  GI has signed off.  No recommendation for liver biopsy. 9/25-nephrology treating for hepatorenal syndrome with octreotide and midodrine.  Improving creatinine levels.  Assessment and Plan: Principal Problem:   Anemia Active Problems:   Hyperbilirubinemia   Pressure injury of skin   Alcoholic cirrhosis of liver without ascites (HCC)   Alcoholic hepatitis with ascites   AKI (acute kidney injury) (HCC)   Thrombocytopenia (HCC)   Acute alcoholic hepatitis with possible hepatorenal syndrome Anion Gap Metabolic Acidosis GI followed the patient during hospitalization.  Likely acute alcoholic hepatitis with hepatorenal syndrome.  On midodrine and octreotide.  Status post albumin challenge 9/17.  Minimal ascites on ultrasound 9/16.  Continue rifaximin and lactulose. Creatinine at 2.7 from 3.1 < 3.5 <3.5 <3.5<3.3 < 3.3< 3.2 and 2.9.  On high-dose Lasix 80 mg IV twice daily. ANA and Anti-mitochondrial labs within normal limits.   On sodium bicarb for metabolic acidosis.    Patient is positive fluid balance for 19921 mL at 1600 mL output.  Patient has increased urinary output with improving creatinine.  Follow nephrology recommendation.  Hypokalemia.  Will replace orally.  Check BMP in AM.  Goal is to keep more than 4.   Pancytopenia Hematology followed the patient during hospitalization.  Has signed off at this time.  DIC panel negative.  Has thrombocytopenia and mild leukocytosis at this time.    Had received 2 units of packed RBC initially.  Latest hemoglobin at 7.8 we will continue to monitor  Acute metabolic encephalopathy secondary to to cefepime toxicity, mild hyperammonemia, multiorgan failure Cefepime discontinued based on EEG results and  transitioned to ceftriaxone 9/13, completed on 9/15. CT head without acute issues.    Received thiamine.  Continue oral lactulose and rifaximin.  On fentanyl as needed pain due to renal  function.  Ileus  N/V Resolved.  Sepsis 2/2 Urinary Source or Skin Wounds   Blood culture showed Staphylococcus warneri in 1/2 likely contaminant.  Patient received 7 days of cefepime/ceftriaxone completed on 9/15. Blood culture 9/14 negative to date.    Supraventricular tachycardia. Spoke with nephrology about it.  Have started on low-dose metoprolol due to episodes of tachycardia and SVT.  Magnesium goal more than 2 and potassium goal more than 4  Adult Failure to Thrive, dysphagia, Patient wishes [name] [phone] to make decisions for him and his goddaughter. Family meeting held 9/16.  Palliative care on board for ongoing goals of care discussion.  On [place] feeding.  Speech therapy has seen the patient and started the patient on dysphagia 2 diet.  We will need to continue to monitor for progress/nutritional needs  Deep tissue injury buttocks, perineum, sacrum.  Present on admission -wound care management  Debility, deconditioning.  Physical therapy on 01/09/2022 has recommended skilled nursing facility.  Goals of care.  Patient has overall poor overall prognosis.  I have communicated this with patient's wife at bedside.  Palliative care has seen the patient and have been in conversation with the patient's son.  Plan for conversation with the son by palliative care on 01/14/2022.   DVT prophylaxis: Place and maintain sequential compression device Start: 12/27/21 1918 SCDs Start: 12/27/21 1828   Code Status:     Code Status: Full Code  Disposition: Uncertain at this time.  Status is: Inpatient  Remains inpatient appropriate because: Pending clinical improvement, multiple organ dysfunction, renal failure, goals of care discussion, need for SNF placement.   Family Communication:  I again spoke with the patient's wife at bedside on 01/13/2022. Marland Kitchen  Consultants:  Critical care  Nephrology   palliative care GI  Procedures:  PRBC transfusion Cortrak tube tube  insertion NG tube insertion Foley catheter placement. Rectal tube placement. PICC line placement.  Antimicrobials:  Rifaximin  Subjective: Today, patient was seen and examined at bedside.  Appears to be little more alert awake and mildly Communicative.  Patient's wife at bedside.  Denies any vomiting, abdominal pain fever.  Still has cortrak tube tube in place.  Less tachycardia today.    Objective: Vitals:   01/13/22 0500 01/13/22 0544 01/13/22 0804 01/13/22 0955  BP:  117/65 119/65 (!) 109/92  Pulse:   64 64  Resp:  13    Temp:   97.8 F (36.6 C)   TempSrc:   Oral   SpO2:   100%   Weight: 86.5 kg     Height:        Intake/Output Summary (Last 24 hours) at 01/13/2022 1132 Last data filed at 01/12/2022 2036 Gross per 24 hour  Intake 100 ml  Output --  Net 100 ml    Filed Weights   01/07/22 0325 01/09/22 0540 01/13/22 0500  Weight: 80.5 kg 84.7 kg 86.5 kg    Physical Examination: Body mass index is 29.87 kg/m.   General:  Average built, not in obvious distress, weak and deconditioned, older than stated age, little more alert and communicative. HENT:   No scleral pallor or icterus noted. Oral mucosa is moist.  Cortrak tube tube in place. Chest:   Diminished breath sounds bilaterally. No crackles or wheezes.  CVS: S1 &S2 heard. No murmur.  Regular rate and rhythm. Abdomen: Soft, nontender, nondistended.  Bowel sounds are heard.  Rectal tube in place, Foley catheter in place Extremities: No cyanosis, clubbing with pitting edema peripheral pulses are palpable.  Left upper extremity PICC line in place Psych: Alert, awake and oriented to place and person.  Slow to respond, flat affect CNS: Alert awake and communicative, slow to speech, power equal in all extremities.   Skin: Warm and dry.  Deep tissue injury over the left and right buttocks and left hip.  Data Reviewed:   CBC: Recent Labs  Lab 01/08/22 0531 01/09/22 0320 01/10/22 0256 01/11/22 0147 01/13/22 0312   WBC 18.6* 18.3* 16.8* 14.2* 12.1*  HGB 7.8* 7.8* 7.8* 8.3* 7.8*  HCT 23.7* 24.7* 25.0* 25.6* 24.4*  MCV 100.9* 101.2* 103.7* 101.6* 103.8*  PLT 53* 57* 58* 65* 89*     Basic Metabolic Panel: Recent Labs  Lab 01/07/22 1855 01/08/22 0531 01/09/22 0320 01/10/22 0256 01/11/22 0147 01/12/22 0359 01/13/22 0312  NA 135 134* 136 137 139 143 143  K 3.7 3.6 3.8 3.5 3.6 3.3* 3.8  CL 110 105 108 108 110 110 117*  CO2 14* 17* 18* 18* 18* 20* 21*  GLUCOSE 98 112* 141* 132* 121* 147* 114*  BUN 76* 81* 87* 91* 99* 97* 98*  CREATININE 3.38* 3.57* 3.56* 3.53* 3.51* 3.10* 2.73*  CALCIUM 8.4* 9.2 8.9 9.0 9.0 9.5 8.9  MG 2.2 2.4 2.1 2.0  --   --  1.7  PHOS  --  4.3 3.7 4.1 3.3 3.0  --      Liver Function Tests: Recent Labs  Lab 01/08/22 0531 01/09/22 0320 01/10/22 0256 01/11/22 0147 01/12/22 0359 01/13/22 0312  AST 31 41  --   --   --  75*  ALT 20 24  --   --   --  50*  ALKPHOS 111 139*  --   --   --  249*  BILITOT 5.3* 4.8* 4.0* 3.5* 4.0* 3.8*  PROT 5.2* 5.3*  --   --   --  5.6*  ALBUMIN 2.2* 2.3* 2.3* 2.3* 2.3* 2.1*      Radiology Studies: No results found.    LOS: 17 days    Joycelyn Das, MD [facility] Available via Epic secure chat 7am-7pm After these hours, please refer to coverage provider listed on [email or web address] 01/13/2022, 11:32 AM

## 2022-01-14 DIAGNOSIS — G9340 Encephalopathy, unspecified: Secondary | ICD-10-CM | POA: Diagnosis not present

## 2022-01-14 DIAGNOSIS — Z7189 Other specified counseling: Secondary | ICD-10-CM | POA: Diagnosis not present

## 2022-01-14 DIAGNOSIS — K7010 Alcoholic hepatitis without ascites: Secondary | ICD-10-CM

## 2022-01-14 DIAGNOSIS — K7030 Alcoholic cirrhosis of liver without ascites: Secondary | ICD-10-CM | POA: Diagnosis not present

## 2022-01-14 DIAGNOSIS — Z515 Encounter for palliative care: Secondary | ICD-10-CM | POA: Diagnosis not present

## 2022-01-14 LAB — RENAL FUNCTION PANEL
Albumin: 2.1 g/dL — ABNORMAL LOW (ref 3.5–5.0)
Anion gap: 10 (ref 5–15)
BUN: 97 mg/dL — ABNORMAL HIGH (ref 6–20)
CO2: 22 mmol/L (ref 22–32)
Calcium: 9.2 mg/dL (ref 8.9–10.3)
Chloride: 114 mmol/L — ABNORMAL HIGH (ref 98–111)
Creatinine, Ser: 2.09 mg/dL — ABNORMAL HIGH (ref 0.44–1.00)
GFR, Estimated: 27 mL/min — ABNORMAL LOW (ref >60)
Glucose, Bld: 161 mg/dL — ABNORMAL HIGH (ref 70–99)
Phosphorus: 3.5 mg/dL (ref 2.5–4.6)
Potassium: 3.3 mmol/L — ABNORMAL LOW (ref 3.5–5.1)
Sodium: 146 mmol/L — ABNORMAL HIGH (ref 135–145)

## 2022-01-14 LAB — CBC
HCT: 27.2 % — ABNORMAL LOW (ref 36.0–46.0)
Hemoglobin: 8.6 g/dL — ABNORMAL LOW (ref 12.0–15.0)
MCH: 33.7 pg (ref 26.0–34.0)
MCHC: 31.6 g/dL (ref 30.0–36.0)
MCV: 106.7 fL — ABNORMAL HIGH (ref 80.0–100.0)
Platelets: 106 10*3/uL — ABNORMAL LOW (ref 150–400)
RBC: 2.55 MIL/uL — ABNORMAL LOW (ref 3.87–5.11)
WBC: 14 10*3/uL — ABNORMAL HIGH (ref 4.0–10.5)
nRBC: 0 % (ref 0.0–0.2)

## 2022-01-14 LAB — BILIRUBIN, FRACTIONATED(TOT/DIR/INDIR)
Bilirubin, Direct: 1.5 mg/dL — ABNORMAL HIGH (ref 0.0–0.2)
Indirect Bilirubin: 1.8 mg/dL — ABNORMAL HIGH (ref 0.3–0.9)
Total Bilirubin: 3.3 mg/dL — ABNORMAL HIGH (ref 0.3–1.2)

## 2022-01-14 LAB — GLUCOSE, CAPILLARY
Glucose-Capillary: 126 mg/dL — ABNORMAL HIGH (ref 70–99)
Glucose-Capillary: 122 mg/dL — ABNORMAL HIGH (ref 70–99)
Glucose-Capillary: 111 mg/dL — ABNORMAL HIGH (ref 70–99)
Glucose-Capillary: 112 mg/dL — ABNORMAL HIGH (ref 70–99)

## 2022-01-14 LAB — MAGNESIUM: Magnesium: 1.6 mg/dL — ABNORMAL LOW (ref 1.7–2.4)

## 2022-01-14 MED ORDER — ENSURE ENLIVE PO LIQD
237.0000 mL | Freq: Two times a day (BID) | ORAL | Status: DC
  Administered 2022-01-14 – 2022-01-20 (×11): 237 mL via ORAL

## 2022-01-14 MED ORDER — LACTULOSE 10 GM/15ML PO SOLN
10.0000 g | Freq: Every day | ORAL | Status: DC
  Filled 2022-01-14: qty 15

## 2022-01-14 MED ORDER — POTASSIUM CHLORIDE CRYS ER 10 MEQ PO TBCR
40.0000 meq | EXTENDED_RELEASE_TABLET | ORAL | Status: AC
  Administered 2022-01-14 (×2): 40 meq via ORAL
  Filled 2022-01-14 (×2): qty 4

## 2022-01-14 MED ORDER — MAGNESIUM SULFATE 4 GM/100ML IV SOLN
4.0000 g | Freq: Once | INTRAVENOUS | Status: AC
  Administered 2022-01-14: 4 g via INTRAVENOUS
  Filled 2022-01-14: qty 100

## 2022-01-14 NOTE — TOC Progression Note (Signed)
Transition of Care [facility]) - Progression Note    Patient Details  Name: Laura [name] MRN: [id number] Date of Birth: 30-Jul-1960  [facility] CM/SW Contact  Joanne Chars, LCSW Phone Number: 01/14/2022, 11:34 AM  Clinical Narrative:   PASSR obtained: [id number]      Barriers to Discharge: Continued Medical Work up  Expected Discharge Plan and Services     Discharge Planning Services: CM Consult   Living arrangements for the past 2 months: Apartment                                       Social Determinants of Health (SDOH) Interventions    Readmission Risk Interventions    07/02/2021   11:32 AM  Readmission Risk Prevention Plan  Post Dischage Appt Complete  Medication Screening Complete  Transportation Screening Complete

## 2022-01-14 NOTE — Progress Notes (Addendum)
Nutrition Follow-up  DOCUMENTATION CODES:   Not applicable  INTERVENTION:  Tube feeds via Cortrak tube: Recommend advancing to goal as tolerated; goal Osmolite 1.5 at 50 mL/hr (1200 mL/day) 60 mL ProSource TF20 - BID Provides 1960 kcal, 115 grams of protein, and 914 mL free water daily.   Addendum: MD would like to remove Cortrak tube on 9/27.   - Continue Dys 2 diet with thin liquids.   - Add Ensure BID.  Ensure Enlive po BID, each supplement provides 350 kcal and 20 grams of protein.  NUTRITION DIAGNOSIS:   Increased nutrient needs related to acute illness, wound healing as evidenced by estimated needs.  GOAL:   Patient will meet greater than or equal to 90% of their needs - Progressing: TF advancing towards goal.   MONITOR:   PO intake, Supplement acceptance, Diet advancement, Labs, Weight trends, Skin  REASON FOR ASSESSMENT:   Consult Enteral/tube feeding initiation and management  ASSESSMENT:   Pt admitted with weakness, septic shock and SVT. PMH significant for chronic anemia and hx EtOH use  Meds reviewed. Labs reviewed.   Pt in bed with family member at bedside. NG tube remains in place. TF infusing at 45 mL/hr at time of assessment. RN reports that the pt is tolerating TF well with no issues. PO intakes are minimal per record.   NUTRITION - FOCUSED PHYSICAL EXAM:  Flowsheet Row Most Recent Value  Orbital Region No depletion  Upper Arm Region No depletion  Thoracic and Lumbar Region No depletion  Buccal Region No depletion  Temple Region No depletion  Clavicle Bone Region No depletion  Clavicle and Acromion Bone Region Mild depletion  Scapular Bone Region Mild depletion  Dorsal Hand Mild depletion  Patellar Region No depletion  Anterior Thigh Region Mild depletion  Posterior Calf Region No depletion  Edema (RD Assessment) None  Hair Reviewed  Eyes Unable to assess  Mouth Unable to assess  Skin Reviewed  Nails Reviewed       Diet Order:    Diet Order             DIET DYS 2 Room service appropriate? Yes; Fluid consistency: Thin  Diet effective now                   EDUCATION NEEDS:   No education needs have been identified at this time  Skin:  Skin Assessment: Skin Integrity Issues: Skin Integrity Issues:: DTI DTI: bilat buttocks  Last BM:  9/26 via rectal tube  Height:   Ht Readings from Last 1 Encounters:  01/01/22 5\' 7"  (1.702 m)   Weight:   Wt Readings from Last 1 Encounters:  01/14/22 85.3 kg    Ideal Body Weight:  61.4 kg  BMI:  Body mass index is 29.45 kg/m.  Estimated Nutritional Needs:   Kcal:  1800-2000  Protein:  100-115g  Fluid:  >/=1.8L  Charlayne Vultaggio Graciela Husbands, RD, LDN, CNSC

## 2022-01-14 NOTE — Progress Notes (Signed)
[facility] Progress Note  Patient: Laura [name]     OIZ:[id number]  DOA: 12/27/2021   PCP: Sharon Seller, NP       Brief hospital course: This is a 61 year old female with anemia, hypertension, SVT, tobacco abuse and chronic back pain who presented to the hospital with a fall while trying to get to the bathroom.  He was on the floor for at least 1 day.  He was noted to be in SVT and hypotensive by EMS.  In the ED, hypotensive and in SVT.  Potassium 3.1, creatinine 2.1, ammonia 60, AST ALT mildly elevated, lactic acid 4.0. Admitted to the ICU for acute alcoholic hepatitis.  Treated for hepatorenal syndrome. Significant Hospital events.  9/9 -Admit s/p mechanical fall initial Hgb 5.0 > 2 U pRBC-Vancomycin and Cefepime started 9/11 Hgb 6.8 > 1U pRBC; Bcx 1 NG 2 days-pulled off vancomycin 9/12 1/2 Bcx grew staphylococcus species, possible contaminant 9/13 continued thrombocytopenia, hem onc consulted. Worsening encephalopathy-EEG severe encephalopathy/cefepime toxicity, CT head no acute issues, stopped cefepime switched to CTX 9/14 Increasing WBC-repeat Bcx, restart vancomycin and continue CTX-Bcx Staphylococcus warneri 1/2 likely contaminant, Cortrak placed, Albumin x 2, MAP goal of 80-85 possible hepatorenal 9/15 levophed, albumin and octreotide added for potential hepatorenal syndrome 9/16 NGT placed for N/V 9/17 Nephrology consult, recs: Continue with biopsy if clinically relevant, continue with rifaximin and Lactulose for hepatic encephalopathy 9/21.  GI has signed off.  No recommendation for liver biopsy. 9/25-nephrology treating for hepatorenal syndrome with octreotide and midodrine.  Improving creatinine levels.  Subjective:  States cor trak is uncomfortable.  Assessment and Plan: Principal Problem:   Hepatitis, alcoholic, acute  Chronic thrombocytopenia  Hepatomegaly and hepatic steatosis noted on abdominal ultrasound on 9/9 - LFTs improving -Receiving rifaximin and  lactulose-appears to be having diarrhea- reduce lactulose to daily  Active Problems: Hepatorenal syndrome - DC octreotide-continue midodrine - leave foley for today - hold Lasix today  Metabolic acidosis - cont Sodium Bicarb BID  Diarrhea - reduce Lactulose  Hypokalemia and hypomagnesemia - replacing- recheck tomorrow  Dysphagia - dc cor trak - cont D 2 diet with thin liquids     DVT prophylaxis:  Place and maintain sequential compression device Start: 12/27/21 1918    Code Status: Full Code  Consultants: nephrology Level of Care: Level of care: Progressive  Objective:   Vitals:   01/14/22 0500 01/14/22 0506 01/14/22 0747 01/14/22 0926  BP:  129/69 138/81 128/72  Pulse:   62 65  Resp:  16    Temp:  98.4 F (36.9 C) 98 F (36.7 C)   TempSrc:  Oral Oral   SpO2:  100% 100%   Weight: 85.3 kg     Height:       Filed Weights   01/09/22 0540 01/13/22 0500 01/14/22 0500  Weight: 84.7 kg 86.5 kg 85.3 kg   Exam: General exam: Appears comfortable  HEENT: oral mucosa moist- cor track present Respiratory system: Clear to auscultation.  Cardiovascular system: S1 & S2 heard  Gastrointestinal system: Abdomen soft, non-tender, nondistended. Normal bowel sounds   Extremities: No cyanosis, clubbing or edema Psychiatry:  Mood & affect appropriate.    Imaging and lab data was personally reviewed    CBC: Recent Labs  Lab 01/09/22 0320 01/10/22 0256 01/11/22 0147 01/13/22 0312 01/14/22 0344  WBC 18.3* 16.8* 14.2* 12.1* 14.0*  HGB 7.8* 7.8* 8.3* 7.8* 8.6*  HCT 24.7* 25.0* 25.6* 24.4* 27.2*  MCV 101.2* 103.7* 101.6* 103.8* 106.7*  PLT 57* 58*  65* 89* 517*   Basic Metabolic Panel: Recent Labs  Lab 01/08/22 0531 01/09/22 0320 01/10/22 0256 01/11/22 0147 01/12/22 0359 01/13/22 0312 01/14/22 0344  NA 134* 136 137 139 143 143 146*  K 3.6 3.8 3.5 3.6 3.3* 3.8 3.3*  CL 105 108 108 110 110 117* 114*  CO2 17* 18* 18* 18* 20* 21* 22  GLUCOSE 112* 141* 132* 121* 147*  114* 161*  BUN 81* 87* 91* 99* 97* 98* 97*  CREATININE 3.57* 3.56* 3.53* 3.51* 3.10* 2.73* 2.09*  CALCIUM 9.2 8.9 9.0 9.0 9.5 8.9 9.2  MG 2.4 2.1 2.0  --   --  1.7 1.6*  PHOS 4.3 3.7 4.1 3.3 3.0  --  3.5   GFR: Estimated Creatinine Clearance (by C-G formula based on SCr of 2.09 mg/dL (H)) Female: 32.5 mL/min (A) Female: 39.7 mL/min (A)  Scheduled Meds:  Chlorhexidine Gluconate Cloth  6 each Topical Q0600   cholecalciferol  2,000 Units Per Tube Daily   darbepoetin (ARANESP) injection - NON-DIALYSIS  200 mcg Subcutaneous Q Thu-1800   feeding supplement (PROSource TF20)  60 mL Per Tube BID   folic acid  1 mg Per Tube Daily   furosemide  80 mg Intravenous Q12H   Gerhardt's butt cream   Topical BID   lactulose  10 g Per Tube BID   leptospermum manuka honey  1 Application Topical Daily   metoprolol tartrate  12.5 mg Oral BID   midodrine  15 mg Per Tube Q8H   multivitamin  1 tablet Per Tube QHS   mouth rinse  15 mL Mouth Rinse 4 times per day   pantoprazole  40 mg Per Tube Daily   rifaximin  550 mg Per Tube TID   sodium bicarbonate  1,300 mg Per Tube BID   sodium chloride flush  10-40 mL Intracatheter Q12H   thiamine  100 mg Per Tube Daily   Continuous Infusions:  sodium chloride 10 mL/hr at 01/07/22 2000   feeding supplement (OSMOLITE 1.5 CAL) 1,000 mL (01/12/22 1425)     LOS: 18 days   Author: Debbe Odea  01/14/2022 2:21 PM

## 2022-01-14 NOTE — Progress Notes (Signed)
Subjective:    SCr improved to 2.1, K 3.3 1.3L UOP No interval other issues  Objective Vital signs in last 24 hours: Vitals:   01/14/22 0500 01/14/22 0506 01/14/22 0747 01/14/22 0926  BP:  129/69 138/81 128/72  Pulse:   62 65  Resp:  16    Temp:  98.4 F (36.9 C) 98 F (36.7 C)   TempSrc:  Oral Oral   SpO2:  100% 100%   Weight: 85.3 kg     Height:       Weight change: -1.2 kg  Intake/Output Summary (Last 24 hours) at 01/14/2022 1131 Last data filed at 01/13/2022 2030 Gross per 24 hour  Intake --  Output 1345 ml  Net -1345 ml      Assessment/Plan: 1. Renal-  AKI 2/2 ATN and/or HRS crt 0.5 in June of 2023-  crt on 9/9 was 2.1---- down to 1.3 on 9/12 but then started to rise slowly  -   Now recovering GFR Rec therapy for HRS octreotide and midodrine -- stop octreotide 9/27, can cont midodrine as needed for BP support 2. HTN/volume-   weaned off of levo--- on midodrine.  Cont lasix 80 IV BID for hypervolemia 3. Anemia-  Hb stable of late, CTM 4. Liver dz-  bili is improving -  GI says cirrhosis and put on lactulose and rifaximine-  meld score 31 est 52% mortality in 3 mos  5. Metabolic acidosis-  stable with oral sodium bicarb   Clinically stable.  No further suggestions.  Will sign off for now.  Please call with any questions or concerns.  Pt does not need follow up with nephrology at this time.  If persistent low GFR and discharge we can schedule appt at that time.    Arita Miss    Labs: Basic Metabolic Panel: Recent Labs  Lab 01/11/22 0147 01/12/22 0359 01/13/22 0312 01/14/22 0344  NA 139 143 143 146*  K 3.6 3.3* 3.8 3.3*  CL 110 110 117* 114*  CO2 18* 20* 21* 22  GLUCOSE 121* 147* 114* 161*  BUN 99* 97* 98* 97*  CREATININE 3.51* 3.10* 2.73* 2.09*  CALCIUM 9.0 9.5 8.9 9.2  PHOS 3.3 3.0  --  3.5    Liver Function Tests: Recent Labs  Lab 01/08/22 0531 01/09/22 0320 01/10/22 0256 01/12/22 0359 01/13/22 0312 01/14/22 0344  AST 31 41  --   --   75*  --   ALT 20 24  --   --  50*  --   ALKPHOS 111 139*  --   --  249*  --   BILITOT 5.3* 4.8*   < > 4.0* 3.8* 3.3*  PROT 5.2* 5.3*  --   --  5.6*  --   ALBUMIN 2.2* 2.3*   < > 2.3* 2.1* 2.1*   < > = values in this interval not displayed.    No results for input(s): "LIPASE", "AMYLASE" in the last 168 hours. No results for input(s): "AMMONIA" in the last 168 hours.  CBC: Recent Labs  Lab 01/09/22 0320 01/10/22 0256 01/11/22 0147 01/13/22 0312 01/14/22 0344  WBC 18.3* 16.8* 14.2* 12.1* 14.0*  HGB 7.8* 7.8* 8.3* 7.8* 8.6*  HCT 24.7* 25.0* 25.6* 24.4* 27.2*  MCV 101.2* 103.7* 101.6* 103.8* 106.7*  PLT 57* 58* 65* 89* 106*    Cardiac Enzymes: No results for input(s): "CKTOTAL", "CKMB", "CKMBINDEX", "TROPONINI" in the last 168 hours. CBG: Recent Labs  Lab 01/13/22 1616 01/13/22 2049 01/13/22 2333 01/14/22 3244  01/14/22 0748  GLUCAP 144* 148* 147* 126* 122*     Iron Studies: No results for input(s): "IRON", "TIBC", "TRANSFERRIN", "FERRITIN" in the last 72 hours. Studies/Results: No results found. Medications: Infusions:  sodium chloride 10 mL/hr at 01/07/22 2000   feeding supplement (OSMOLITE 1.5 CAL) 1,000 mL (01/12/22 1425)   magnesium sulfate bolus IVPB 4 g (01/14/22 1030)    Scheduled Medications:  Chlorhexidine Gluconate Cloth  6 each Topical Q0600   cholecalciferol  2,000 Units Per Tube Daily   darbepoetin (ARANESP) injection - NON-DIALYSIS  200 mcg Subcutaneous Q Thu-1800   feeding supplement (PROSource TF20)  60 mL Per Tube BID   folic acid  1 mg Per Tube Daily   furosemide  80 mg Intravenous Q12H   Gerhardt's butt cream   Topical BID   lactulose  10 g Per Tube BID   leptospermum manuka honey  1 Application Topical Daily   metoprolol tartrate  12.5 mg Oral BID   midodrine  15 mg Per Tube Q8H   multivitamin  1 tablet Per Tube QHS   mouth rinse  15 mL Mouth Rinse 4 times per day   pantoprazole  40 mg Per Tube Daily   potassium chloride  40 mEq Oral  Q4H   rifaximin  550 mg Per Tube TID   sodium bicarbonate  1,300 mg Per Tube BID   sodium chloride flush  10-40 mL Intracatheter Q12H   thiamine  100 mg Per Tube Daily    have reviewed scheduled and prn medications.  Physical Exam: General: more alert today -  slow to respond to questions but does respond-  debilitated  Heart: RRR Lungs: mostly clear Abdomen: soft, non tender Extremities: 1+ LEE  01/14/2022,11:31 AM  LOS: 18 days

## 2022-01-15 DIAGNOSIS — K7010 Alcoholic hepatitis without ascites: Secondary | ICD-10-CM | POA: Diagnosis not present

## 2022-01-15 LAB — GLUCOSE, CAPILLARY
Glucose-Capillary: 83 mg/dL (ref 70–99)
Glucose-Capillary: 113 mg/dL — ABNORMAL HIGH (ref 70–99)

## 2022-01-15 LAB — BILIRUBIN, FRACTIONATED(TOT/DIR/INDIR)
Bilirubin, Direct: 1.3 mg/dL — ABNORMAL HIGH (ref 0.0–0.2)
Indirect Bilirubin: 1.5 mg/dL — ABNORMAL HIGH (ref 0.3–0.9)
Total Bilirubin: 2.8 mg/dL — ABNORMAL HIGH (ref 0.3–1.2)

## 2022-01-15 LAB — RENAL FUNCTION PANEL
Albumin: 2.2 g/dL — ABNORMAL LOW (ref 3.5–5.0)
Anion gap: 15 (ref 5–15)
BUN: 96 mg/dL — ABNORMAL HIGH (ref 6–20)
CO2: 24 mmol/L (ref 22–32)
Calcium: 9.8 mg/dL (ref 8.9–10.3)
Chloride: 107 mmol/L (ref 98–111)
Creatinine, Ser: 1.6 mg/dL — ABNORMAL HIGH (ref 0.44–1.00)
GFR, Estimated: 37 mL/min — ABNORMAL LOW (ref >60)
Glucose, Bld: 100 mg/dL — ABNORMAL HIGH (ref 70–99)
Phosphorus: 3.8 mg/dL (ref 2.5–4.6)
Potassium: 3.5 mmol/L (ref 3.5–5.1)
Sodium: 146 mmol/L — ABNORMAL HIGH (ref 135–145)

## 2022-01-15 LAB — MAGNESIUM: Magnesium: 2.2 mg/dL (ref 1.7–2.4)

## 2022-01-15 MED ORDER — LACTULOSE 10 GM/15ML PO SOLN
10.0000 g | Freq: Every day | ORAL | Status: DC
  Administered 2022-01-15: 10 g via ORAL
  Filled 2022-01-15: qty 15

## 2022-01-15 MED ORDER — MIDODRINE HCL 5 MG PO TABS: 10.0000 mg | ORAL_TABLET | Freq: Three times a day (TID) | ORAL | Status: DC

## 2022-01-15 MED ORDER — RIFAXIMIN 550 MG PO TABS
550.0000 mg | ORAL_TABLET | Freq: Three times a day (TID) | ORAL | Status: DC
  Administered 2022-01-15 – 2022-01-22 (×22): 550 mg via ORAL
  Filled 2022-01-15 (×24): qty 1

## 2022-01-15 MED ORDER — PANTOPRAZOLE 2 MG/ML SUSPENSION
40.0000 mg | Freq: Every day | ORAL | Status: DC
  Administered 2022-01-15 – 2022-01-20 (×6): 40 mg via ORAL
  Filled 2022-01-15 (×6): qty 20

## 2022-01-15 MED ORDER — VITAMIN D 25 MCG (1000 UNIT) PO TABS
2000.0000 [IU] | ORAL_TABLET | Freq: Every day | ORAL | Status: DC
  Administered 2022-01-15 – 2022-01-22 (×8): 2000 [IU] via ORAL
  Filled 2022-01-15 (×8): qty 2

## 2022-01-15 MED ORDER — RENA-VITE PO TABS
1.0000 | ORAL_TABLET | Freq: Every day | ORAL | Status: DC
  Administered 2022-01-15 – 2022-01-20 (×6): 1 via ORAL
  Filled 2022-01-15 (×6): qty 1

## 2022-01-15 MED ORDER — POTASSIUM CHLORIDE CRYS ER 20 MEQ PO TBCR
40.0000 meq | EXTENDED_RELEASE_TABLET | ORAL | Status: AC
  Administered 2022-01-15 (×2): 40 meq via ORAL
  Filled 2022-01-15 (×2): qty 2

## 2022-01-15 MED ORDER — FOLIC ACID 1 MG PO TABS
1.0000 mg | ORAL_TABLET | Freq: Every day | ORAL | Status: DC
  Administered 2022-01-15 – 2022-01-22 (×8): 1 mg via ORAL
  Filled 2022-01-15 (×8): qty 1

## 2022-01-15 MED ORDER — MAGNESIUM SULFATE 4 GM/100ML IV SOLN
4.0000 g | Freq: Once | INTRAVENOUS | Status: AC
  Administered 2022-01-15: 4 g via INTRAVENOUS
  Filled 2022-01-15: qty 100

## 2022-01-15 MED ORDER — SODIUM BICARBONATE 650 MG PO TABS
1300.0000 mg | ORAL_TABLET | Freq: Two times a day (BID) | ORAL | Status: DC
  Administered 2022-01-15 – 2022-01-16 (×4): 1300 mg via ORAL
  Filled 2022-01-15 (×4): qty 2

## 2022-01-15 MED ORDER — MIDODRINE HCL 5 MG PO TABS
10.0000 mg | ORAL_TABLET | Freq: Three times a day (TID) | ORAL | Status: DC
  Administered 2022-01-15 – 2022-01-16 (×3): 10 mg via ORAL
  Filled 2022-01-15 (×3): qty 2

## 2022-01-15 MED ORDER — THIAMINE MONONITRATE 100 MG PO TABS
100.0000 mg | ORAL_TABLET | Freq: Every day | ORAL | Status: DC
  Administered 2022-01-15 – 2022-01-22 (×8): 100 mg via ORAL
  Filled 2022-01-15 (×8): qty 1

## 2022-01-15 NOTE — Progress Notes (Signed)
PT Cancellation Note  Patient Details Name: Laura [name] MRN: [id number] DOB: [birth year]   Cancelled Treatment:    Reason Eval/Treat Not Completed: Patient declined, no reason specified.  Declined to move and then declined to try to do bed exercises.  He agreed to be asked again tomorrow and will try to do therapy then.  Maximove to chair has been recommended.   Ramond Dial 01/15/2022, 2:44 PM  Mee Hives, PT PhD Acute Rehab Dept. Number: [place] [facility] [place]

## 2022-01-15 NOTE — Progress Notes (Addendum)
Patient ID: Laura [name], adult   DOB: May 10, 1960, 61 y.o.   MRN: [id number]    Progress Note from the Palliative Medicine Team at [facility]   Patient Name: Laura [name]        Date: 01/15/2022 DOB: 27-Nov-1960  Age: 61 y.o. MRN#: [id number] Attending Physician: Laura Odea, MD Primary Care Physician: Laura Chandler, NP Admit Date: 12/27/2021   Medical records reviewed, discussed with attending team, assessed the patient.   Laural Legrand [name]" ) Laura [name] is a 61 y.o. adult who identifies as female with PMH of chronic back pain, alcohol abuse, anemia, HTN, and HLD who presented to [facility] ED on 12/27/2021 with reports of weakness and fall. Initial labs revealed severe anemia, elevated bilirubin, and elevated ammonia. Admitted to PCCM with septic shock with unclear source. Also with symptomatic anemia, coagulopathy, SVT/hypotension, AKI, mild rhabdomyolysis, and failure to thrive.   Initial palliative consult 12/30/2021  This NP assessed patient at the bedside as a follow up palliative medicine needs and emotional support.  Patient is lethargic, however is able to  follow simple commands currently.  He does not have decision-making capacity at this time.    Significant other/Laura [name] at bedside.  Laura [name] is NOT legal spouse.  Patient's son is the main decision maker         Scheduled meeting was planned for 10 AM this morning with his son/Laura [name].  I called Laura [name] on the phone, he apologized and said he could not make me until 2 PM.  Unfortunately they he did not show for that planned 2:00 family meeting either.  Patient is with multiple life limiting comorbidities and high risk for decompensation.  Patient does not have medical decision-making capacity at this time.  His son Laura [name] is his main support and Laura [name].  There are many outstanding decisions related to treatment plan moving forward.  Patient is high risk for decompensation.  I have stressed to patient's son the importance of continued conversation  with medical team and advanced care planning documentation.     PMT will continue to support holistically     Discussed with treatment team   Questions and concerns addressed     Laura Lessen NP  Palliative Medicine Team Team Phone # [phone] Pager [phone]

## 2022-01-15 NOTE — Plan of Care (Signed)

## 2022-01-15 NOTE — Care Management Important Message (Signed)
Important Message  Patient Details  Name: Laura [name] MRN: [id number] Date of Birth: 1960-11-20   Medicare Important Message Given:  Yes     Hannah Beat 01/15/2022, 12:36 PM

## 2022-01-15 NOTE — TOC Progression Note (Signed)
Transition of Care [facility]) - Progression Note    Patient Details  Name: Laura [name] MRN: [id number] Date of Birth: 1960/09/29  [facility]) CM/SW Contact  Joanne Chars, LCSW Phone Number: 01/15/2022, 11:19 AM  Clinical Narrative:    CSW spoke with pt son Laura [name] regarding bed offers.  He will be at the hospital tomorrow and asked if CSW could talk then about options.  Choice document with offers placed in pt room.  Pt asleep.       Barriers to Discharge: Continued Medical Work up  Expected Discharge Plan and Services     Discharge Planning Services: CM Consult   Living arrangements for the past 2 months: Apartment                                       Social Determinants of Health (SDOH) Interventions    Readmission Risk Interventions    07/02/2021   11:32 AM  Readmission Risk Prevention Plan  Post Dischage Appt Complete  Medication Screening Complete  Transportation Screening Complete

## 2022-01-15 NOTE — Progress Notes (Signed)
[facility] Progress Note  Patient: Laura [name]     ZOX:[id number]  DOA: 12/27/2021   PCP: Lauree Chandler, NP       Brief hospital course: This is a 61 year old female with anemia, hypertension, SVT, tobacco abuse and chronic back pain who presented to the hospital with a fall while trying to get to the bathroom.  He was on the floor for at least 1 day.  He was noted to be in SVT and hypotensive by EMS.  In the ED, hypotensive and in SVT.  Potassium 3.1, creatinine 2.1, ammonia 60, AST ALT mildly elevated, lactic acid 4.0. Admitted to the ICU for acute alcoholic hepatitis.  Treated for hepatorenal syndrome. Significant Hospital events.  9/9 -Admit s/p mechanical fall initial Hgb 5.0 > 2 U pRBC-Vancomycin and Cefepime started 9/11 Hgb 6.8 > 1U pRBC; Bcx 1 NG 2 days-pulled off vancomycin 9/12 1/2 Bcx grew staphylococcus species, possible contaminant 9/13 continued thrombocytopenia, hem onc consulted. Worsening encephalopathy-EEG severe encephalopathy/cefepime toxicity, CT head no acute issues, stopped cefepime switched to CTX 9/14 Increasing WBC-repeat Bcx, restart vancomycin and continue CTX-Bcx Staphylococcus warneri 1/2 likely contaminant, Cortrak placed, Albumin x 2, MAP goal of 80-85 possible hepatorenal 9/15 levophed, albumin and octreotide added for potential hepatorenal syndrome 9/16 NGT placed for N/V 9/17 Nephrology consult, recs: Continue with biopsy if clinically relevant, continue with rifaximin and Lactulose for hepatic encephalopathy 9/21.  GI has signed off.  No recommendation for liver biopsy. 9/25-nephrology treating for hepatorenal syndrome with octreotide and midodrine.  Improving creatinine levels.  Subjective:  He has no complaints today. States he is eating well. RN states he only ate 25% of breakfast.  Assessment and Plan: Principal Problem:  ETOH abuse with suspected alcoholic hepatitis  Chronic thrombocytopenia  Hepatomegaly and hepatic steatosis noted  on abdominal ultrasound on 9/9 - stopped drinking in Feb 2023 - LFTs improving - GI does not plan to perform liver biopsy and signed off on 9/21  Active Problems: Encephalopahty, multifactorial - treated with Thiamine for possible Wernicke's encephalopathy -Receiving rifaximin and lactulose  - ? If Cefepime contributed - remains confused to time, place and situation  Hepatorenal syndrome - treated with octreotide and Midodrine - weaning midodrine - leave foley for now to watch I and O   Metabolic acidosis - cont Sodium Bicarb BID  Ileus (resolved) - now with Diarrhea-  - reduced Lactulose dose on 9/27- cont to follow stool output closely  Hypokalemia and hypomagnesemia - replacing- recheck tomorrow  Dysphagia - dc cor trak 9/27 - cont D 2 diet with thin liquids  H/o SVT - cont Metoprolol  Hypotension - required Levophed - wean Midodrine to 10 mg TID today  Anemia, macrocytic - has received numerous blood transfusions during this hospital stay  - FOBT + stools but no overt bleeding - Vit B12, folate normal  Nicotine abuse - smokes 1ppd  Disposition: nutrition/oral intake poor, continue palliative care discussions, will need SNF   DVT prophylaxis:  Place and maintain sequential compression device Start: 12/27/21 1918    Code Status: Full Code  Consultants: nephrology Level of Care: Level of care: Progressive  Objective:   Vitals:   01/14/22 0747 01/14/22 0926 01/14/22 2110 01/15/22 0729  BP: 138/81 128/72 128/85 (!) 159/74  Pulse: 62 65  (!) 52  Resp:      Temp: 98 F (36.7 C)  97.7 F (36.5 C) 97.6 F (36.4 C)  TempSrc: Oral  Oral Oral  SpO2: 100%   100%  Weight:  Height:       Filed Weights   01/09/22 0540 01/13/22 0500 01/14/22 0500  Weight: 84.7 kg 86.5 kg 85.3 kg   Exam: General exam: Appears comfortable  HEENT: oral mucosa moist Respiratory system: Clear to auscultation.  Cardiovascular system: S1 & S2 heard  Gastrointestinal  system: Abdomen soft, non-tender, nondistended. Normal bowel sounds   Extremities: No cyanosis, clubbing or edema Psychiatry:  Mood & affect appropriate.    Imaging and lab data was personally reviewed    CBC: Recent Labs  Lab 01/09/22 0320 01/10/22 0256 01/11/22 0147 01/13/22 0312 01/14/22 0344  WBC 18.3* 16.8* 14.2* 12.1* 14.0*  HGB 7.8* 7.8* 8.3* 7.8* 8.6*  HCT 24.7* 25.0* 25.6* 24.4* 27.2*  MCV 101.2* 103.7* 101.6* 103.8* 106.7*  PLT 57* 58* 65* 89* 106*    Basic Metabolic Panel: Recent Labs  Lab 01/09/22 0320 01/10/22 0256 01/11/22 0147 01/12/22 0359 01/13/22 0312 01/14/22 0344 01/15/22 0315  NA 136 137 139 143 143 146* 146*  K 3.8 3.5 3.6 3.3* 3.8 3.3* 3.5  CL 108 108 110 110 117* 114* 107  CO2 18* 18* 18* 20* 21* 22 24  GLUCOSE 141* 132* 121* 147* 114* 161* 100*  BUN 87* 91* 99* 97* 98* 97* 96*  CREATININE 3.56* 3.53* 3.51* 3.10* 2.73* 2.09* 1.60*  CALCIUM 8.9 9.0 9.0 9.5 8.9 9.2 9.8  MG 2.1 2.0  --   --  1.7 1.6*  --   PHOS 3.7 4.1 3.3 3.0  --  3.5 3.8    GFR: Estimated Creatinine Clearance (by C-G formula based on SCr of 1.6 mg/dL (H)) Female: 44.0 mL/min (A) Female: 51.9 mL/min (A)  Scheduled Meds:  Chlorhexidine Gluconate Cloth  6 each Topical Q0600   cholecalciferol  2,000 Units Per Tube Daily   darbepoetin (ARANESP) injection - NON-DIALYSIS  200 mcg Subcutaneous Q Thu-1800   feeding supplement  237 mL Oral BID BM   folic acid  1 mg Per Tube Daily   Gerhardt's butt cream   Topical BID   lactulose  10 g Per Tube Daily   leptospermum manuka honey  1 Application Topical Daily   metoprolol tartrate  12.5 mg Oral BID   midodrine  10 mg Per Tube Q8H   multivitamin  1 tablet Per Tube QHS   mouth rinse  15 mL Mouth Rinse 4 times per day   pantoprazole  40 mg Per Tube Daily   rifaximin  550 mg Per Tube TID   sodium bicarbonate  1,300 mg Per Tube BID   sodium chloride flush  10-40 mL Intracatheter Q12H   thiamine  100 mg Per Tube Daily   Continuous  Infusions:  sodium chloride 10 mL/hr at 01/07/22 2000     LOS: 19 days   Author: Calvert Cantor  01/15/2022 7:49 AM

## 2022-01-16 DIAGNOSIS — N179 Acute kidney failure, unspecified: Secondary | ICD-10-CM | POA: Diagnosis not present

## 2022-01-16 DIAGNOSIS — K7010 Alcoholic hepatitis without ascites: Secondary | ICD-10-CM | POA: Diagnosis not present

## 2022-01-16 LAB — RENAL FUNCTION PANEL
Albumin: 2.4 g/dL — ABNORMAL LOW (ref 3.5–5.0)
Anion gap: 13 (ref 5–15)
BUN: 89 mg/dL — ABNORMAL HIGH (ref 6–20)
CO2: 23 mmol/L (ref 22–32)
Calcium: 9.2 mg/dL (ref 8.9–10.3)
Chloride: 108 mmol/L (ref 98–111)
Creatinine, Ser: 1.28 mg/dL — ABNORMAL HIGH (ref 0.44–1.00)
GFR, Estimated: 48 mL/min — ABNORMAL LOW (ref >60)
Glucose, Bld: 107 mg/dL — ABNORMAL HIGH (ref 70–99)
Phosphorus: 4.7 mg/dL — ABNORMAL HIGH (ref 2.5–4.6)
Potassium: 4.9 mmol/L (ref 3.5–5.1)
Sodium: 144 mmol/L (ref 135–145)

## 2022-01-16 LAB — BILIRUBIN, FRACTIONATED(TOT/DIR/INDIR)
Bilirubin, Direct: 1.5 mg/dL — ABNORMAL HIGH (ref 0.0–0.2)
Indirect Bilirubin: 1.7 mg/dL — ABNORMAL HIGH (ref 0.3–0.9)
Total Bilirubin: 3.2 mg/dL — ABNORMAL HIGH (ref 0.3–1.2)

## 2022-01-16 LAB — MAGNESIUM: Magnesium: 2.8 mg/dL — ABNORMAL HIGH (ref 1.7–2.4)

## 2022-01-16 MED ORDER — MIDODRINE HCL 5 MG PO TABS
5.0000 mg | ORAL_TABLET | Freq: Three times a day (TID) | ORAL | Status: DC
  Administered 2022-01-16 (×2): 5 mg via ORAL
  Filled 2022-01-16 (×3): qty 1

## 2022-01-16 NOTE — Plan of Care (Signed)

## 2022-01-16 NOTE — Progress Notes (Signed)
[facility] Progress Note  Patient: Laura [name]     IHK:[id number]  DOA: 12/27/2021   PCP: Lauree Chandler, NP       Brief hospital course: This is a 61 year old female with anemia, hypertension, SVT, tobacco abuse and chronic back pain who presented to the hospital with a fall while trying to get to the bathroom.  He was on the floor for at least 1 day.  He was noted to be in SVT and hypotensive by EMS.  In the ED, hypotensive and in SVT.  Potassium 3.1, creatinine 2.1, ammonia 60, AST ALT mildly elevated, lactic acid 4.0. Admitted to the ICU for acute alcoholic hepatitis.  Treated for hepatorenal syndrome. Significant Hospital events.  9/9 -Admit s/p mechanical fall initial Hgb 5.0 > 2 U pRBC-Vancomycin and Cefepime started 9/11 Hgb 6.8 > 1U pRBC; Bcx 1 NG 2 days-pulled off vancomycin 9/12 1/2 Bcx grew staphylococcus species, possible contaminant 9/13 continued thrombocytopenia, hem onc consulted. Worsening encephalopathy-EEG severe encephalopathy/cefepime toxicity, CT head no acute issues, stopped cefepime switched to CTX 9/14 Increasing WBC-repeat Bcx, restart vancomycin and continue CTX-Bcx Staphylococcus warneri 1/2 likely contaminant, Cortrak placed, Albumin x 2, MAP goal of 80-85 possible hepatorenal 9/15 levophed, albumin and octreotide added for potential hepatorenal syndrome 9/16 NGT placed for N/V 9/17 Nephrology consult, recs: Continue with biopsy if clinically relevant, continue with rifaximin and Lactulose for hepatic encephalopathy 9/21.  GI has signed off.  No recommendation for liver biopsy. 9/25-nephrology treating for hepatorenal syndrome with octreotide and midodrine.  Improving creatinine levels.  Subjective:  No complaints today.   Assessment and Plan: Principal Problem:  ETOH abuse with suspected alcoholic hepatitis  Chronic thrombocytopenia  Hepatomegaly and hepatic steatosis noted on abdominal ultrasound on 9/9 - stopped drinking in Feb 2023 - LFTs  improving - GI does not plan to perform liver biopsy and signed off on 9/21  Active Problems: Encephalopahty, multifactorial - treated with Thiamine for possible Wernicke's encephalopathy -Receiving rifaximin and lactulose  - ? If Cefepime contributed -  seems more oriented today but still confused to time and situation  Hepatorenal syndrome - treated with octreotide and Midodrine - weaning midodrine - leave foley for now to watch I and O  Metabolic acidosis - cont Sodium Bicarb BID  Ileus (resolved) - now with Diarrhea- 600 cc output yesterday - stop lactulose for today  Hypokalemia and hypomagnesemia - replaced  Dysphagia - dc cor trak 9/27 - cont D 2 diet with thin liquids  H/o SVT - cont Metoprolol  Hypotension - required Levophed - wean Midodrine to 10 mg TID today  Anemia, macrocytic - has received numerous blood transfusions during this hospital stay  - FOBT + stools but no overt bleeding - Vit B12, folate normal  Nicotine abuse - smokes 1ppd  Disposition: nutrition/oral intake continues to be poor- 25% at breakfast, continue palliative care discussions, will need SNF   DVT prophylaxis:  Place and maintain sequential compression device Start: 12/27/21 1918    Code Status: Full Code  Consultants: nephrology Level of Care: Level of care: Progressive  Objective:   Vitals:   01/15/22 2222 01/16/22 0451 01/16/22 0814 01/16/22 1500  BP: 137/81 137/71 134/80 (!) 123/95  Pulse: 60 (!) 58 66 63  Resp:   17 17  Temp: 98.6 F (37 C) 98.6 F (37 C) 97.8 F (36.6 C) 98.8 F (37.1 C)  TempSrc: Oral Oral Oral Oral  SpO2:   100% 100%  Weight:  84.5 kg    Height:  Filed Weights   01/13/22 0500 01/14/22 0500 01/16/22 0451  Weight: 86.5 kg 85.3 kg 84.5 kg   Exam: General exam: Appears comfortable  HEENT: oral mucosa moist Respiratory system: Clear to auscultation.  Cardiovascular system: S1 & S2 heard  Gastrointestinal system: Abdomen soft,  non-tender, nondistended. Normal bowel sounds   Extremities: No cyanosis, clubbing or edema Psychiatry:  Mood & affect appropriate.    Imaging and lab data was personally reviewed    CBC: Recent Labs  Lab 01/10/22 0256 01/11/22 0147 01/13/22 0312 01/14/22 0344  WBC 16.8* 14.2* 12.1* 14.0*  HGB 7.8* 8.3* 7.8* 8.6*  HCT 25.0* 25.6* 24.4* 27.2*  MCV 103.7* 101.6* 103.8* 106.7*  PLT 58* 65* 89* 106*    Basic Metabolic Panel: Recent Labs  Lab 01/10/22 0256 01/11/22 0147 01/12/22 0359 01/13/22 0312 01/14/22 0344 01/15/22 0315 01/16/22 0252  NA 137 139 143 143 146* 146* 144  K 3.5 3.6 3.3* 3.8 3.3* 3.5 4.9  CL 108 110 110 117* 114* 107 108  CO2 18* 18* 20* 21* 22 24 23   GLUCOSE 132* 121* 147* 114* 161* 100* 107*  BUN 91* 99* 97* 98* 97* 96* 89*  CREATININE 3.53* 3.51* 3.10* 2.73* 2.09* 1.60* 1.28*  CALCIUM 9.0 9.0 9.5 8.9 9.2 9.8 9.2  MG 2.0  --   --  1.7 1.6* 2.2 2.8*  PHOS 4.1 3.3 3.0  --  3.5 3.8 4.7*    GFR: Estimated Creatinine Clearance (by C-G formula based on SCr of 1.28 mg/dL (H)) Female: mL/min (A) Female: 64.6 mL/min (A)  Scheduled Meds:  Chlorhexidine Gluconate Cloth  6 each Topical Q0600   cholecalciferol  2,000 Units Oral Daily   darbepoetin (ARANESP) injection - NON-DIALYSIS  200 mcg Subcutaneous Q Thu-1800   feeding supplement  237 mL Oral BID BM   folic acid  1 mg Oral Daily   Gerhardt's butt cream   Topical BID   leptospermum manuka honey  1 Application Topical Daily   metoprolol tartrate  12.5 mg Oral BID   midodrine  5 mg Oral Q8H   multivitamin  1 tablet Oral QHS   mouth rinse  15 mL Mouth Rinse 4 times per day   pantoprazole  40 mg Oral Daily   rifaximin  550 mg Oral TID   sodium bicarbonate  1,300 mg Oral BID   sodium chloride flush  10-40 mL Intracatheter Q12H   thiamine  100 mg Oral Daily   Continuous Infusions:  sodium chloride 10 mL/hr at 01/07/22 2000     LOS: 20 days   Author: 01/09/22  01/16/2022 4:37 PM

## 2022-01-16 NOTE — TOC Progression Note (Addendum)
Transition of Care [facility]) - Progression Note    Patient Details  Name: Aliveah Gallant MRN: [id number] Date of Birth: [birth year]  Transition of Care [facility]) CM/SW Contact  Joanne Chars, LCSW Phone Number: 01/16/2022, 1:56 PM  Clinical Narrative:   CSW met with pt and son [name] in room, choice document given with bed offers, they would like to visit [facility] and are leaning towards that facility.  CSW spoke with [facility] and she will alert admissions that son will come by shortly for tour.  1500: Rod Holler, son.  He does want to accept offer at [facility].  [facility] notified.    Barriers to Discharge: Continued Medical Work up  Expected Discharge Plan and Services     Discharge Planning Services: CM Consult   Living arrangements for the past 2 months: Apartment                                       Social Determinants of Health (SDOH) Interventions    Readmission Risk Interventions    07/02/2021   11:32 AM  Readmission Risk Prevention Plan  Post Dischage Appt Complete  Medication Screening Complete  Transportation Screening Complete

## 2022-01-16 NOTE — Progress Notes (Signed)
This RN completed FMLA paperwork with provider assistance for son, Ajaya Crutchfield. Faxed directly to employer, copy of document given to son for reference.

## 2022-01-16 NOTE — Progress Notes (Signed)
Physical Therapy Treatment Patient Details Name: Laura [name] MRN: [id number] DOB: 08/01/1960 Today's Date: 01/16/2022   History of Present Illness Patient is a 61 y/o female who presents on 9/9 with weakness and a fall. Admitted with severe anemia, Acute hypoxemic respiratory failure, septic shock, SVTs, hypotension, acute kidney injury, SIRS, mild rhabdomyolosis. PMH includes chronic back pain, alcohol use and tobacco use disorder.    PT Comments    Patient agreeable to therapy session yet skeptical of therapist. Able to perform bed mobility with maxA for LE management and trunk elevation. Able to maintain sitting balance with B UE support and min guard. Posterior lean at times with fatigue but able to correct with cues. Performed seated LAQ and reaching at bedside. Continues to demonstrate impaired cognition during session. Continue to recommend SNF for ongoing Physical Therapy.       Recommendations for follow up therapy are one component of a multi-disciplinary discharge planning process, led by the attending physician.  Recommendations may be updated based on patient status, additional functional criteria and insurance authorization.  Follow Up Recommendations  Skilled nursing-short term rehab (<3 hours/day) Can patient physically be transported by private vehicle: No   Assistance Recommended at Discharge Frequent or constant Supervision/Assistance  Patient can return home with the following Two people to help with walking and/or transfers;Two people to help with bathing/dressing/bathroom;Assistance with cooking/housework;Assistance with feeding;Direct supervision/assist for medications management;Direct supervision/assist for financial management;Assist for transportation;Help with stairs or ramp for entrance   Equipment Recommendations  None recommended by PT    Recommendations for Other Services       Precautions / Restrictions Precautions Precautions: Fall Precaution Comments:  watch O2, flexiseal, foley, PICC Restrictions Weight Bearing Restrictions: No     Mobility  Bed Mobility Overal bed mobility: Needs Assistance Bed Mobility: Supine to Sit, Sit to Supine     Supine to sit: Max assist Sit to supine: Max assist   General bed mobility comments: patient able to initiate trunk elevation with HHA but ultimately maxA. Able to return trunk to supine with assistance for BLE    Transfers                   General transfer comment: deferred - lack of +2 assistance    Ambulation/Gait                   Stairs             Wheelchair Mobility    Modified Rankin (Stroke Patients Only)       Balance Overall balance assessment: Needs assistance Sitting-balance support: Feet supported, Bilateral upper extremity supported Sitting balance-Leahy Scale: Fair Sitting balance - Comments: initially poor but with time and hand placement, able to maintain with min guard                                    Cognition Arousal/Alertness: Awake/alert Behavior During Therapy: Flat affect, Anxious Overall Cognitive Status: No family/caregiver present to determine baseline cognitive functioning                                 General Comments: delayed responses and disoriented to time and situation        Exercises General Exercises - Lower Extremity Long Arc Quad: Both, 10 reps, Seated Other Exercises Other Exercises: Reaching for therapist hand out in  front x 5    General Comments        Pertinent Vitals/Pain Pain Assessment Pain Assessment: Faces Faces Pain Scale: Hurts even more Pain Location: L LE with movement Pain Descriptors / Indicators: Discomfort, Grimacing, Guarding, Moaning Pain Intervention(s): Monitored during session, Repositioned    Home Living                          Prior Function            PT Goals (current goals can now be found in the care plan section) Acute  Rehab PT Goals PT Goal Formulation: With patient Time For Goal Achievement: 01/30/22 Potential to Achieve Goals: Fair Progress towards PT goals: Progressing toward goals    Frequency    Min 2X/week      PT Plan Current plan remains appropriate    Co-evaluation              AM-PAC PT "6 Clicks" Mobility   Outcome Measure  Help needed turning from your back to your side while in a flat bed without using bedrails?: Total Help needed moving from lying on your back to sitting on the side of a flat bed without using bedrails?: Total Help needed moving to and from a bed to a chair (including a wheelchair)?: Total Help needed standing up from a chair using your arms (e.g., wheelchair or bedside chair)?: Total Help needed to walk in hospital room?: Total Help needed climbing 3-5 steps with a railing? : Total 6 Click Score: 6    End of Session   Activity Tolerance: Patient tolerated treatment well Patient left: in bed;with call bell/phone within reach;with bed alarm set;with SCD's reapplied Nurse Communication: Mobility status PT Visit Diagnosis: Other abnormalities of gait and mobility (R26.89);Muscle weakness (generalized) (M62.81);Pain     Time: 1531-1556 PT Time Calculation (min) (ACUTE ONLY): 25 min  Charges:  $Therapeutic Activity: 23-37 mins                     Basem Yannuzzi A. Dan Humphreys PT, DPT Acute Rehabilitation Services Office [phone]    [name] 01/16/2022, 4:55 PM

## 2022-01-17 DIAGNOSIS — N179 Acute kidney failure, unspecified: Secondary | ICD-10-CM | POA: Diagnosis not present

## 2022-01-17 DIAGNOSIS — K7010 Alcoholic hepatitis without ascites: Secondary | ICD-10-CM | POA: Diagnosis not present

## 2022-01-17 LAB — CBC
HCT: 27.8 % — ABNORMAL LOW (ref 36.0–46.0)
Hemoglobin: 8.5 g/dL — ABNORMAL LOW (ref 12.0–15.0)
MCH: 33.7 pg (ref 26.0–34.0)
MCHC: 30.6 g/dL (ref 30.0–36.0)
MCV: 110.3 fL — ABNORMAL HIGH (ref 80.0–100.0)
Platelets: 122 10*3/uL — ABNORMAL LOW (ref 150–400)
RBC: 2.52 MIL/uL — ABNORMAL LOW (ref 3.87–5.11)
WBC: 11.2 10*3/uL — ABNORMAL HIGH (ref 4.0–10.5)
nRBC: 0 % (ref 0.0–0.2)

## 2022-01-17 LAB — BILIRUBIN, FRACTIONATED(TOT/DIR/INDIR)
Bilirubin, Direct: 1.4 mg/dL — ABNORMAL HIGH (ref 0.0–0.2)
Indirect Bilirubin: 1.4 mg/dL — ABNORMAL HIGH (ref 0.3–0.9)
Total Bilirubin: 2.8 mg/dL — ABNORMAL HIGH (ref 0.3–1.2)

## 2022-01-17 LAB — RENAL FUNCTION PANEL
Albumin: 2.2 g/dL — ABNORMAL LOW (ref 3.5–5.0)
Anion gap: 9 (ref 5–15)
BUN: 71 mg/dL — ABNORMAL HIGH (ref 6–20)
CO2: 25 mmol/L (ref 22–32)
Calcium: 9.1 mg/dL (ref 8.9–10.3)
Chloride: 114 mmol/L — ABNORMAL HIGH (ref 98–111)
Creatinine, Ser: 1.03 mg/dL — ABNORMAL HIGH (ref 0.44–1.00)
GFR, Estimated: >60 mL/min (ref >60)
Glucose, Bld: 106 mg/dL — ABNORMAL HIGH (ref 70–99)
Phosphorus: 5.2 mg/dL — ABNORMAL HIGH (ref 2.5–4.6)
Potassium: 4.2 mmol/L (ref 3.5–5.1)
Sodium: 148 mmol/L — ABNORMAL HIGH (ref 135–145)

## 2022-01-17 MED ORDER — MIDODRINE HCL 5 MG PO TABS
2.5000 mg | ORAL_TABLET | Freq: Three times a day (TID) | ORAL | Status: DC
  Administered 2022-01-17 – 2022-01-18 (×2): 2.5 mg via ORAL
  Filled 2022-01-17 (×2): qty 1

## 2022-01-17 MED ORDER — SODIUM BICARBONATE 650 MG PO TABS
650.0000 mg | ORAL_TABLET | Freq: Two times a day (BID) | ORAL | Status: DC
  Administered 2022-01-17 – 2022-01-18 (×3): 650 mg via ORAL
  Filled 2022-01-17 (×3): qty 1

## 2022-01-17 MED ORDER — DIPHENOXYLATE-ATROPINE 2.5-0.025 MG PO TABS
1.0000 | ORAL_TABLET | Freq: Once | ORAL | Status: AC
  Administered 2022-01-17: 1 via ORAL
  Filled 2022-01-17: qty 1

## 2022-01-17 NOTE — Progress Notes (Signed)
[facility] Progress Note  Patient: Rosemary Mossbarger     LGX:[id number]  DOA: 12/27/2021   PCP: Sharon Seller, NP       Brief hospital course: This is a 61 year old female with anemia, hypertension, SVT, tobacco abuse and chronic back pain who presented to the hospital with a fall while trying to get to the bathroom.  He was on the floor for at least 1 day.  He was noted to be in SVT and hypotensive by EMS.  In the ED, hypotensive and in SVT.  Potassium 3.1, creatinine 2.1, ammonia 60, AST ALT mildly elevated, lactic acid 4.0. Admitted to the ICU for acute alcoholic hepatitis.  Treated for hepatorenal syndrome. Significant Hospital events.  9/9 -Admit s/p mechanical fall initial Hgb 5.0 > 2 U pRBC-Vancomycin and Cefepime started 9/11 Hgb 6.8 > 1U pRBC; Bcx 1 NG 2 days-pulled off vancomycin 9/12 1/2 Bcx grew staphylococcus species, possible contaminant 9/13 continued thrombocytopenia, hem onc consulted. Worsening encephalopathy-EEG severe encephalopathy/cefepime toxicity, CT head no acute issues, stopped cefepime switched to CTX 9/14 Increasing WBC-repeat Bcx, restart vancomycin and continue CTX-Bcx Staphylococcus warneri 1/2 likely contaminant, Cortrak placed, Albumin x 2, MAP goal of 80-85 possible hepatorenal 9/15 levophed, albumin and octreotide added for potential hepatorenal syndrome 9/16 NGT placed for N/V 9/17 Nephrology consult, recs: Continue with biopsy if clinically relevant, continue with rifaximin and Lactulose for hepatic encephalopathy 9/21.  GI has signed off.  No recommendation for liver biopsy. 9/25-nephrology treating for hepatorenal syndrome with octreotide and midodrine.  Improving creatinine levels.  Subjective:  He states he is eating well and his significant other at his bedside agrees. Per RN he only had a few bites of breakfast.  Assessment and Plan: Principal Problem:  ETOH abuse with suspected alcoholic hepatitis  Chronic thrombocytopenia  Hepatomegaly  and hepatic steatosis noted on abdominal ultrasound on 9/9 - stopped drinking in Feb 2023 - LFTs improving - GI does not plan to perform liver biopsy and signed off on 9/21  Active Problems: Encephalopahty, multifactorial - treated with Thiamine for possible Wernicke's encephalopathy -Receiving rifaximin and lactulose  - ? If Cefepime contributed -  confused to time and situation  Hepatorenal syndrome - treated with octreotide and Midodrine - weaning midodrine - leave foley for now to watch I and O  Metabolic acidosis - cont Sodium Bicarb BID- recude dose to 650 mg   Ileus (resolved) - now with Diarrhea-- will give a dose of Lomotil today- hold Lactulose   Hypokalemia and hypomagnesemia - replaced  Dysphagia - dc cor trak 9/27 - cont D 2 diet with thin liquids  H/o SVT - cont Metoprolol  Hypotension - required Levophed - wean Midodrine to 2.5 mg TID today  Anemia, macrocytic - has received numerous blood transfusions during this hospital stay  - FOBT + stools but no overt bleeding - Vit B12, folate normal  Nicotine abuse - smokes 1ppd  Disposition: nutrition/oral intake continues to be poor- 25% at breakfast, continue palliative care discussions, will need SNF   DVT prophylaxis:  Place and maintain sequential compression device Start: 12/27/21 1918    Code Status: Full Code  Consultants: nephrology Level of Care: Level of care: Progressive  Objective:   Vitals:   01/17/22 0432 01/17/22 0500 01/17/22 0815 01/17/22 1346  BP:   (!) 143/76 (!) 142/75  Pulse:   62 60  Resp:   15 17  Temp: 98.8 F (37.1 C)  97.7 F (36.5 C) 98.6 F (37 C)  TempSrc: Oral  Oral Oral  SpO2:   100% 100%  Weight:  85.2 kg    Height:       Filed Weights   01/14/22 0500 01/16/22 0451 01/17/22 0500  Weight: 85.3 kg 84.5 kg 85.2 kg   Exam: General exam: Appears comfortable  HEENT: oral mucosa moist Respiratory system: Clear to auscultation.  Cardiovascular system: S1 & S2  heard  Gastrointestinal system: Abdomen soft, non-tender, nondistended. Normal bowel sounds   Extremities: No cyanosis, clubbing or edema Psychiatry:  Mood & affect appropriate.     Imaging and lab data was personally reviewed    CBC: Recent Labs  Lab 01/11/22 0147 01/13/22 0312 01/14/22 0344 01/17/22 0500  WBC 14.2* 12.1* 14.0* 11.2*  HGB 8.3* 7.8* 8.6* 8.5*  HCT 25.6* 24.4* 27.2* 27.8*  MCV 101.6* 103.8* 106.7* 110.3*  PLT 65* 89* 106* 122*    Basic Metabolic Panel: Recent Labs  Lab 01/12/22 0359 01/13/22 0312 01/14/22 0344 01/15/22 0315 01/16/22 0252 01/17/22 0500  NA 143 143 146* 146* 144 148*  K 3.3* 3.8 3.3* 3.5 4.9 4.2  CL 110 117* 114* 107 108 114*  CO2 20* 21* 22 24 23 25   GLUCOSE 147* 114* 161* 100* 107* 106*  BUN 97* 98* 97* 96* 89* 71*  CREATININE 3.10* 2.73* 2.09* 1.60* 1.28* 1.03*  CALCIUM 9.5 8.9 9.2 9.8 9.2 9.1  MG  --  1.7 1.6* 2.2 2.8*  --   PHOS 3.0  --  3.5 3.8 4.7* 5.2*    GFR: Estimated Creatinine Clearance (by C-G formula based on SCr of 1.03 mg/dL (H)) Female: 65.9 mL/min (A) Female: 80.5 mL/min (A)  Scheduled Meds:  Chlorhexidine Gluconate Cloth  6 each Topical Q0600   cholecalciferol  2,000 Units Oral Daily   darbepoetin (ARANESP) injection - NON-DIALYSIS  200 mcg Subcutaneous Q Thu-1800   feeding supplement  237 mL Oral BID BM   folic acid  1 mg Oral Daily   Gerhardt's butt cream   Topical BID   leptospermum manuka honey  1 Application Topical Daily   metoprolol tartrate  12.5 mg Oral BID   midodrine  2.5 mg Oral Q8H   multivitamin  1 tablet Oral QHS   mouth rinse  15 mL Mouth Rinse 4 times per day   pantoprazole  40 mg Oral Daily   rifaximin  550 mg Oral TID   sodium bicarbonate  650 mg Oral BID   sodium chloride flush  10-40 mL Intracatheter Q12H   thiamine  100 mg Oral Daily   Continuous Infusions:  sodium chloride 10 mL/hr at 01/07/22 2000     LOS: 21 days   Author: Eunice Blase Ambrosio Reuter  01/17/2022 4:08 PM

## 2022-01-17 NOTE — Plan of Care (Signed)

## 2022-01-18 DIAGNOSIS — E870 Hyperosmolality and hypernatremia: Secondary | ICD-10-CM

## 2022-01-18 DIAGNOSIS — K7010 Alcoholic hepatitis without ascites: Secondary | ICD-10-CM | POA: Diagnosis not present

## 2022-01-18 LAB — BASIC METABOLIC PANEL
Anion gap: 9 (ref 5–15)
BUN: 49 mg/dL — ABNORMAL HIGH (ref 6–20)
CO2: 26 mmol/L (ref 22–32)
Calcium: 8.9 mg/dL (ref 8.9–10.3)
Chloride: 116 mmol/L — ABNORMAL HIGH (ref 98–111)
Creatinine, Ser: 0.92 mg/dL (ref 0.44–1.00)
GFR, Estimated: >60 mL/min (ref >60)
Glucose, Bld: 121 mg/dL — ABNORMAL HIGH (ref 70–99)
Potassium: 3.8 mmol/L (ref 3.5–5.1)
Sodium: 151 mmol/L — ABNORMAL HIGH (ref 135–145)

## 2022-01-18 LAB — CBC
HCT: 30.6 % — ABNORMAL LOW (ref 36.0–46.0)
Hemoglobin: 9.3 g/dL — ABNORMAL LOW (ref 12.0–15.0)
MCH: 33.5 pg (ref 26.0–34.0)
MCHC: 30.4 g/dL (ref 30.0–36.0)
MCV: 110.1 fL — ABNORMAL HIGH (ref 80.0–100.0)
Platelets: 127 10*3/uL — ABNORMAL LOW (ref 150–400)
RBC: 2.78 MIL/uL — ABNORMAL LOW (ref 3.87–5.11)
WBC: 10.6 10*3/uL — ABNORMAL HIGH (ref 4.0–10.5)
nRBC: 0 % (ref 0.0–0.2)

## 2022-01-18 LAB — RENAL FUNCTION PANEL
Albumin: 2.3 g/dL — ABNORMAL LOW (ref 3.5–5.0)
Anion gap: 8 (ref 5–15)
BUN: 48 mg/dL — ABNORMAL HIGH (ref 6–20)
CO2: 26 mmol/L (ref 22–32)
Calcium: 8.9 mg/dL (ref 8.9–10.3)
Chloride: 116 mmol/L — ABNORMAL HIGH (ref 98–111)
Creatinine, Ser: 0.85 mg/dL (ref 0.44–1.00)
GFR, Estimated: >60 mL/min (ref >60)
Glucose, Bld: 120 mg/dL — ABNORMAL HIGH (ref 70–99)
Phosphorus: 5.1 mg/dL — ABNORMAL HIGH (ref 2.5–4.6)
Potassium: 3.7 mmol/L (ref 3.5–5.1)
Sodium: 150 mmol/L — ABNORMAL HIGH (ref 135–145)

## 2022-01-18 LAB — BILIRUBIN, FRACTIONATED(TOT/DIR/INDIR)
Bilirubin, Direct: 1.5 mg/dL — ABNORMAL HIGH (ref 0.0–0.2)
Indirect Bilirubin: 1.5 mg/dL — ABNORMAL HIGH (ref 0.3–0.9)
Total Bilirubin: 3 mg/dL — ABNORMAL HIGH (ref 0.3–1.2)

## 2022-01-18 MED ORDER — DEXTROSE 5 % IV SOLN: INTRAVENOUS | Status: DC

## 2022-01-18 NOTE — Progress Notes (Signed)
[facility] Progress Note  Patient: Laura [name]     NGE:[id number]  DOA: 12/27/2021   PCP: Lauree Chandler, NP       Brief hospital course: This is a 61 year old female with anemia, hypertension, SVT, tobacco abuse and chronic back pain who presented to the hospital with a fall while trying to get to the bathroom.  He was on the floor for at least 1 day.  He was noted to be in SVT and hypotensive by EMS.  In the ED, hypotensive and in SVT.  Potassium 3.1, creatinine 2.1, ammonia 60, AST ALT mildly elevated, lactic acid 4.0. Admitted to the ICU for acute alcoholic hepatitis.  Treated for hepatorenal syndrome. Significant Hospital events.  9/9 -Admit s/p mechanical fall initial Hgb 5.0 > 2 U pRBC-Vancomycin and Cefepime started 9/11 Hgb 6.8 > 1U pRBC; Bcx 1 NG 2 days-pulled off vancomycin 9/12 1/2 Bcx grew staphylococcus species, possible contaminant 9/13 continued thrombocytopenia, hem onc consulted. Worsening encephalopathy-EEG severe encephalopathy/cefepime toxicity, CT head no acute issues, stopped cefepime switched to CTX 9/14 Increasing WBC-repeat Bcx, restart vancomycin and continue CTX-Bcx Staphylococcus warneri 1/2 likely contaminant, Cortrak placed, Albumin x 2, MAP goal of 80-85 possible hepatorenal 9/15 levophed, albumin and octreotide added for potential hepatorenal syndrome 9/16 NGT placed for N/V 9/17 Nephrology consult, recs: Continue with biopsy if clinically relevant, continue with rifaximin and Lactulose for hepatic encephalopathy 9/21.  GI has signed off.  No recommendation for liver biopsy. 9/25-nephrology treating for hepatorenal syndrome with octreotide and midodrine.  Improving creatinine levels.  Subjective:  No complaints today.  Assessment and Plan: Principal Problem:  ETOH abuse with suspected alcoholic hepatitis  Chronic thrombocytopenia  Hepatomegaly and hepatic steatosis noted on abdominal ultrasound on 9/9 - stopped drinking in Feb 2023 - LFTs  improving - GI does not recommend a liver biopsy and signed off on 9/21  Active Problems: Encephalopahty, multifactorial - treated with Thiamine for possible Wernicke's encephalopathy -Receiving rifaximin and lactulose  - ? If Cefepime contributed -  confused to time and situation  Hepatorenal syndrome - treated with octreotide and Midodrine - weaning midodrine- will dc today - renal function has normalized - leave foley for now to watch I and O  Metabolic acidosis - now hypernatremic- start D5 at 50 cc/hr - dc Sodium Bicarb BID   Ileus (resolved) - with subsequent diarrhea-- lactulose on hold- 1 dose of Lomotic on 9/30 - has resolved - dc rectal tube today   Hypokalemia and hypomagnesemia - replaced  Dysphagia - dc cor trak 9/27 - cont D 2 diet with thin liquids  H/o SVT - cont Metoprolol  Hypotension - required Levophed - dc Midodrine today  Anemia, macrocytic - has received numerous blood transfusions during this hospital stay  - FOBT + stools but no overt bleeding - Vit B12, folate normal  Nicotine abuse - smokes 1ppd  Disposition: nutrition/oral intake continues to be poor-   continue palliative care discussions, will need SNF   DVT prophylaxis:  Place and maintain sequential compression device Start: 12/27/21 1918    Code Status: Full Code  Consultants: nephrology Level of Care: Level of care: Progressive  Objective:   Vitals:   01/17/22 2033 01/17/22 2137 01/18/22 0500 01/18/22 0830  BP: 115/70   (!) 161/87  Pulse: 67 63    Resp: 20   20  Temp: 98.6 F (37 C)   97.9 F (36.6 C)  TempSrc: Oral   Oral  SpO2: 98%   100%  Weight:  81.1 kg   Height:       Filed Weights   01/16/22 0451 01/17/22 0500 01/18/22 0500  Weight: 84.5 kg 85.2 kg 81.1 kg   Exam: General exam: Appears comfortable  HEENT: oral mucosa moist Respiratory system: Clear to auscultation.  Cardiovascular system: S1 & S2 heard  Gastrointestinal system: Abdomen soft,  non-tender, nondistended. Normal bowel sounds   Extremities: No cyanosis, clubbing or edema Psychiatry:  Mood & affect appropriate.     Imaging and lab data was personally reviewed    CBC: Recent Labs  Lab 01/13/22 0312 01/14/22 0344 01/17/22 0500 01/18/22 1059  WBC 12.1* 14.0* 11.2* 10.6*  HGB 7.8* 8.6* 8.5* 9.3*  HCT 24.4* 27.2* 27.8* 30.6*  MCV 103.8* 106.7* 110.3* 110.1*  PLT 89* 106* 122* 127*    Basic Metabolic Panel: Recent Labs  Lab 01/13/22 0312 01/14/22 0344 01/15/22 0315 01/16/22 0252 01/17/22 0500 01/18/22 1059  NA 143 146* 146* 144 148* 151*  150*  K 3.8 3.3* 3.5 4.9 4.2 3.8  3.7  CL 117* 114* 107 108 114* 116*  116*  CO2 21* 22 24 23 25 26  26   GLUCOSE 114* 161* 100* 107* 106* 121*  120*  BUN 98* 97* 96* 89* 71* 49*  48*  CREATININE 2.73* 2.09* 1.60* 1.28* 1.03* 0.92  0.85  CALCIUM 8.9 9.2 9.8 9.2 9.1 8.9  8.9  MG 1.7 1.6* 2.2 2.8*  --   --   PHOS  --  3.5 3.8 4.7* 5.2* 5.1*    GFR: Estimated Creatinine Clearance (by C-G formula based on SCr of 0.85 mg/dL) Female: 78.1 mL/min Female: 95.4 mL/min  Scheduled Meds:  Chlorhexidine Gluconate Cloth  6 each Topical Q0600   cholecalciferol  2,000 Units Oral Daily   darbepoetin (ARANESP) injection - NON-DIALYSIS  200 mcg Subcutaneous Q Thu-1800   feeding supplement  237 mL Oral BID BM   folic acid  1 mg Oral Daily   Gerhardt's butt cream   Topical BID   leptospermum manuka honey  1 Application Topical Daily   metoprolol tartrate  12.5 mg Oral BID   multivitamin  1 tablet Oral QHS   mouth rinse  15 mL Mouth Rinse 4 times per day   pantoprazole  40 mg Oral Daily   rifaximin  550 mg Oral TID   sodium chloride flush  10-40 mL Intracatheter Q12H   thiamine  100 mg Oral Daily   Continuous Infusions:  sodium chloride 10 mL/hr at 01/07/22 2000     LOS: 22 days   Author: Debbe Odea  01/18/2022 12:49 PM

## 2022-01-18 NOTE — Progress Notes (Signed)
This nurse was informed by the nurse tech that this patient pulled out her IV. Upon entering the patient's room, this nurse found the patient's PICC line laying on her chest. Patient is confuse and stated she has "no idea who pulled it out". IV team was notified via Epic and Guffey, DO was also notified. No further instructions given at this time. Will continue to monitor patient.

## 2022-01-18 NOTE — Progress Notes (Signed)
IVT consult not: PICC removed by patient per unit RN. Assesed site post picc removal by patient.Area dry, no bleeding,swelling or drainage, covered w/ dry gauze. PICC length at 44 cm and intact post removal.Per unit RN ,patient not receiving any IV meds at this time. Patient alert,not complaining of pain.

## 2022-01-19 ENCOUNTER — Other Ambulatory Visit: Payer: Self-pay | Admitting: Nurse Practitioner

## 2022-01-19 DIAGNOSIS — Z515 Encounter for palliative care: Secondary | ICD-10-CM | POA: Diagnosis not present

## 2022-01-19 DIAGNOSIS — K7030 Alcoholic cirrhosis of liver without ascites: Secondary | ICD-10-CM | POA: Diagnosis not present

## 2022-01-19 DIAGNOSIS — G9340 Encephalopathy, unspecified: Secondary | ICD-10-CM | POA: Diagnosis not present

## 2022-01-19 DIAGNOSIS — Z7189 Other specified counseling: Secondary | ICD-10-CM | POA: Diagnosis not present

## 2022-01-19 DIAGNOSIS — G629 Polyneuropathy, unspecified: Secondary | ICD-10-CM

## 2022-01-19 LAB — RENAL FUNCTION PANEL
Albumin: 2.5 g/dL — ABNORMAL LOW (ref 3.5–5.0)
Anion gap: 8 (ref 5–15)
BUN: 30 mg/dL — ABNORMAL HIGH (ref 6–20)
CO2: 26 mmol/L (ref 22–32)
Calcium: 8.7 mg/dL — ABNORMAL LOW (ref 8.9–10.3)
Chloride: 112 mmol/L — ABNORMAL HIGH (ref 98–111)
Creatinine, Ser: 0.78 mg/dL (ref 0.44–1.00)
GFR, Estimated: >60 mL/min (ref >60)
Glucose, Bld: 121 mg/dL — ABNORMAL HIGH (ref 70–99)
Phosphorus: 4.5 mg/dL (ref 2.5–4.6)
Potassium: 3.6 mmol/L (ref 3.5–5.1)
Sodium: 146 mmol/L — ABNORMAL HIGH (ref 135–145)

## 2022-01-19 LAB — GLUCOSE, CAPILLARY: Glucose-Capillary: 122 mg/dL — ABNORMAL HIGH (ref 70–99)

## 2022-01-19 NOTE — Plan of Care (Signed)

## 2022-01-19 NOTE — Progress Notes (Signed)
[facility] Progress Note  Patient: Laura [name]     HDQ:[id number]  DOA: 12/27/2021   PCP: Sharon Seller, NP       Brief hospital course: This is a 61 year old female with anemia, hypertension, SVT, tobacco abuse and chronic back pain who presented to the hospital with a fall while trying to get to the bathroom.  He was on the floor for at least 1 day.  He was noted to be in SVT and hypotensive by EMS.  In the ED, hypotensive and in SVT.  Potassium 3.1, creatinine 2.1, ammonia 60, AST ALT mildly elevated, lactic acid 4.0. Admitted to the ICU for acute alcoholic hepatitis.  Treated for hepatorenal syndrome. Significant Hospital events.  9/9 -Admit s/p mechanical fall initial Hgb 5.0 > 2 U pRBC-Vancomycin and Cefepime started 9/11 Hgb 6.8 > 1U pRBC; Bcx 1 NG 2 days-pulled off vancomycin 9/12 1/2 Bcx grew staphylococcus species, possible contaminant 9/13 continued thrombocytopenia, hem onc consulted. Worsening encephalopathy-EEG severe encephalopathy/cefepime toxicity, CT head no acute issues, stopped cefepime switched to CTX 9/14 Increasing WBC-repeat Bcx, restart vancomycin and continue CTX-Bcx Staphylococcus warneri 1/2 likely contaminant, Cortrak placed, Albumin x 2, MAP goal of 80-85 possible hepatorenal 9/15 levophed, albumin and octreotide added for potential hepatorenal syndrome 9/16 NGT placed for N/V 9/17 Nephrology consult, recs: Continue with biopsy if clinically relevant, continue with rifaximin and Lactulose for hepatic encephalopathy 9/21.  GI has signed off.  No recommendation for liver biopsy. 9/25-nephrology treating for hepatorenal syndrome with octreotide and midodrine.  Improving creatinine levels.  Subjective:  He has no complaints. States he is eating the best he can.   Assessment and Plan: Principal Problem:  ETOH abuse with suspected alcoholic hepatitis  Chronic thrombocytopenia  Hepatomegaly and hepatic steatosis noted on abdominal ultrasound on 9/9 -  stopped drinking in Feb 2023 - LFTs improving - GI does not recommend a liver biopsy and signed off on 9/21  Active Problems: Encephalopahty, multifactorial - treated with Thiamine for possible Wernicke's encephalopathy -Receiving rifaximin and lactulose  - ? If Cefepime contributed -  quite alert and oriented now  Hepatorenal syndrome - treated with octreotide and Midodrine - have weaned off of Midodrine - renal function has normalized - remove foley today  Hypernatremia - improving after holding Sodium Bicarb tabs and starting D5  Metabolic acidosis - resolved- dc'd Sodium Bicarb BID on 10/1   Ileus (resolved) - with subsequent diarrhea-- lactulose on hold - 1 dose of Lomotil on 9/30 - has resolved - dc rectal tube 10/1   Hypokalemia and hypomagnesemia - replaced  Dysphagia - dc cor trak 9/27 - cont D 2 diet with thin liquids  H/o SVT - cont Metoprolol  Hypotension - required Levophed - Midodrine weaned off on 10/1  Anemia, macrocytic - has received numerous blood transfusions during this hospital stay  - FOBT + stools but no overt bleeding - Vit B12, folate normal  Nicotine abuse - smokes 1ppd  Disposition: nutrition/oral intake continues to be poor-   continue palliative care discussions- will need SNF   DVT prophylaxis:  Place and maintain sequential compression device Start: 12/27/21 1918    Code Status: Full Code  Consultants: nephrology Level of Care: Level of care: Progressive  Objective:   Vitals:   01/18/22 0830 01/18/22 2115 01/19/22 0451 01/19/22 0813  BP: (!) 161/87 138/81  129/84  Pulse:  70  (!) 57  Resp: 20     Temp: 97.9 F (36.6 C) 98 F (36.7 C)  97.9  F (36.6 C)  TempSrc: Oral Oral  Oral  SpO2: 100% 100%  100%  Weight:   83.4 kg   Height:       Filed Weights   01/17/22 0500 01/18/22 0500 01/19/22 0451  Weight: 85.2 kg 81.1 kg 83.4 kg   Exam: General exam: Appears comfortable  HEENT: oral mucosa moist Respiratory  system: Clear to auscultation.  Cardiovascular system: S1 & S2 heard  Gastrointestinal system: Abdomen soft, non-tender, nondistended. Normal bowel sounds   Extremities: No cyanosis, clubbing or edema Psychiatry:  Mood & affect appropriate.     Imaging and lab data was personally reviewed    CBC: Recent Labs  Lab 01/13/22 0312 01/14/22 0344 01/17/22 0500 01/18/22 1059  WBC 12.1* 14.0* 11.2* 10.6*  HGB 7.8* 8.6* 8.5* 9.3*  HCT 24.4* 27.2* 27.8* 30.6*  MCV 103.8* 106.7* 110.3* 110.1*  PLT 89* 106* 122* 127*    Basic Metabolic Panel: Recent Labs  Lab 01/13/22 0312 01/13/22 0312 01/14/22 0344 01/15/22 0315 01/16/22 0252 01/17/22 0500 01/18/22 1059 01/19/22 1135  NA 143  --  146* 146* 144 148* 151*  150* 146*  K 3.8  --  3.3* 3.5 4.9 4.2 3.8  3.7 3.6  CL 117*  --  114* 107 108 114* 116*  116* 112*  CO2 21*  --  22 24 23 25 26  26 26   GLUCOSE 114*  --  161* 100* 107* 106* 121*  120* 121*  BUN 98*  --  97* 96* 89* 71* 49*  48* 30*  CREATININE 2.73*  --  2.09* 1.60* 1.28* 1.03* 0.92  0.85 0.78  CALCIUM 8.9  --  9.2 9.8 9.2 9.1 8.9  8.9 8.7*  MG 1.7  --  1.6* 2.2 2.8*  --   --   --   PHOS  --    < > 3.5 3.8 4.7* 5.2* 5.1* 4.5   < > = values in this interval not displayed.    GFR: Estimated Creatinine Clearance (by C-G formula based on SCr of 0.78 mg/dL) Female: 84 mL/min Female: 102.7 mL/min  Scheduled Meds:  Chlorhexidine Gluconate Cloth  6 each Topical Q0600   cholecalciferol  2,000 Units Oral Daily   darbepoetin (ARANESP) injection - NON-DIALYSIS  200 mcg Subcutaneous Q Thu-1800   feeding supplement  237 mL Oral BID BM   folic acid  1 mg Oral Daily   Gerhardt's butt cream   Topical BID   leptospermum manuka honey  1 Application Topical Daily   metoprolol tartrate  12.5 mg Oral BID   multivitamin  1 tablet Oral QHS   mouth rinse  15 mL Mouth Rinse 4 times per day   pantoprazole  40 mg Oral Daily   rifaximin  550 mg Oral TID   sodium chloride flush   10-40 mL Intracatheter Q12H   thiamine  100 mg Oral Daily   Continuous Infusions:  sodium chloride 10 mL/hr at 01/07/22 2000   dextrose 50 mL/hr at 01/19/22 1107     LOS: 23 days   Author: Debbe Odea  01/19/2022 12:59 PM

## 2022-01-19 NOTE — Progress Notes (Signed)
Speech Language Pathology Treatment: Dysphagia  Patient Details Name: Laura [name] MRN: [id number] DOB: 1960-12-09 Today's Date: 01/19/2022 Time: 3614-4315 SLP Time Calculation (min) (ACUTE ONLY): 16 min  Assessment / Plan / Recommendation Clinical Impression  Pt alert and leaning to R side upon SLP arrival. Repositioned with pillows to support, which provided adequate positioning for POs. He and family members, at bedside, report no difficulties with regular diet though poor appetite persists. Pt self-fed regular textured solid and thin liquids via consecutive straw sips, exhibiting no clinical signs of aspiration. Oral prep/mastication remains prolonged, however pt cleared oral cavity completely and would consider this functional given missing dentition. Pt prefers regular over soft diet at this time and this would provide him more variety of choices which may encourage intake. Recommend continue regular diet with adherence to universal swallow precautions. Meds whole with water. No further SLP f/u warranted. Will s/o.    HPI HPI: Pt is a 61 y.o. trans man, with a past medical history significant for hypertension, hyperlipidemia, acute bronchitis, and recurrent severe anemia. Pt presented to ER with altered mental status and unresponsiveness from a fall. Pt denied headache or neck pain upon intake but reported some cough/fatigue. Pt with unremarkable BSE on 9/11, but with cortrak starting 9/14. GI has now signed off.      SLP Plan  Discharge SLP treatment due to (comment);All goals met      Recommendations for follow up therapy are one component of a multi-disciplinary discharge planning process, led by the attending physician.  Recommendations may be updated based on patient status, additional functional criteria and insurance authorization.    Recommendations  Diet recommendations: Regular;Thin liquid Liquids provided via: Cup;Straw Medication Administration: Whole meds with  liquid Supervision: Patient able to self feed Compensations: Slow rate;Small sips/bites Postural Changes and/or Swallow Maneuvers: Seated upright 90 degrees                Oral Care Recommendations: Oral care BID Follow Up Recommendations: No SLP follow up Assistance recommended at discharge: None SLP Visit Diagnosis: Dysphagia, unspecified (R13.10) Plan: Discharge SLP treatment due to (comment);All goals met           Ellwood Dense, [place], [place] Office Number: [phone]   Acie Fredrickson  01/19/2022, 1:44 PM

## 2022-01-19 NOTE — Progress Notes (Signed)
Patient ID: [name], adult   DOB: [birth year], 61 y.o.   MRN: [id number]    Progress Note from the Palliative Medicine Team at [facility]   Patient Name: Laura [name]        Date: 01/19/2022 DOB: November 11, 1960  Age: 61 y.o. MRN#: [id number] Attending Physician: Debbe Odea, MD Primary Care Physician: Lauree Chandler, NP Admit Date: 12/27/2021   Medical records reviewed, discussed with attending team, assessed the patient.   Laura [name] is a 61 y.o. adult who identifies as female with PMH of chronic back pain, alcohol abuse, anemia, HTN, and HLD who presented to [facility] ED on 12/27/2021 with reports of weakness and fall. Initial labs revealed severe anemia, elevated bilirubin, and elevated ammonia. Admitted to PCCM with septic shock with unclear source. Also with symptomatic anemia, coagulopathy, SVT/hypotension, AKI, mild rhabdomyolysis, and failure to thrive.   Initial palliative consult 12/30/2021  This NP assessed patient at the bedside as a follow up palliative medicine needs and emotional support.  Patient is alert and oriented today  Significant other/[name] and son/[name] at bedside.   Patient has multiple life limiting co-morbidities, severe debility, poor nutrition  and is  high risk for decompensation.  Patient does not have medical decision-making capacity at this time.  Prior to this admission patient was living with his SO/[name].   His son [name] is his main support and Media planner.  Education offered today regarding complex medical situation. All understand the seriousness.  Education offered on the difference between an aggressive medical intervention path and a palliative comfort path.  Raised awareness to patient autonomy and right to make  own personnel decisions. Challenged patient on the impact of decisions as they relate to personal health and wellness.    A  discussion was had today regarding advanced directives.  Concepts specific to code status, artifical feeding  and hydration, continued IV antibiotics and rehospitalization was had.  The difference between a aggressive medical intervention path  and a palliative comfort care path for this patient at this time was had.   Patient declined assistance with documentation of MOST form or ACP documents   Plan of Care -Full code      -Educated patient/family to consider DNR/DNI status understanding evidenced based poor outcomes in similar hospitalized patient, as the cause of arrest is likely associated with advanced chronic illness rather than an easily reversible acute cardio-pulmonary event.  -patient is open to all offered and available medical interventions to prolong life  -transition to SNF for rehabilitation     Questions and concerns addressed.  Patient/family   encouraged to call with questions or concerns.      Education offered today regarding  the importance of continued conversation with family and their  medical providers regarding overall plan of care and treatment options,  ensuring decisions are within the context of the patients values and GOCs.  Discussed with treatment team   PMT will continue to support holistically.  Questions and concerns addressed     Wadie Lessen NP  Palliative Medicine Team Team Phone # [phone] Pager [phone]

## 2022-01-20 DIAGNOSIS — K7010 Alcoholic hepatitis without ascites: Secondary | ICD-10-CM | POA: Diagnosis not present

## 2022-01-20 DIAGNOSIS — N179 Acute kidney failure, unspecified: Secondary | ICD-10-CM | POA: Diagnosis not present

## 2022-01-20 LAB — RENAL FUNCTION PANEL
Albumin: 2.2 g/dL — ABNORMAL LOW (ref 3.5–5.0)
Anion gap: 8 (ref 5–15)
BUN: 25 mg/dL — ABNORMAL HIGH (ref 6–20)
CO2: 27 mmol/L (ref 22–32)
Calcium: 8.4 mg/dL — ABNORMAL LOW (ref 8.9–10.3)
Chloride: 109 mmol/L (ref 98–111)
Creatinine, Ser: 0.59 mg/dL (ref 0.44–1.00)
GFR, Estimated: >60 mL/min (ref >60)
Glucose, Bld: 113 mg/dL — ABNORMAL HIGH (ref 70–99)
Phosphorus: 4.2 mg/dL (ref 2.5–4.6)
Potassium: 3 mmol/L — ABNORMAL LOW (ref 3.5–5.1)
Sodium: 144 mmol/L (ref 135–145)

## 2022-01-20 LAB — CBC
HCT: 31.7 % — ABNORMAL LOW (ref 36.0–46.0)
Hemoglobin: 9.6 g/dL — ABNORMAL LOW (ref 12.0–15.0)
MCH: 33.2 pg (ref 26.0–34.0)
MCHC: 30.3 g/dL (ref 30.0–36.0)
MCV: 109.7 fL — ABNORMAL HIGH (ref 80.0–100.0)
Platelets: 134 10*3/uL — ABNORMAL LOW (ref 150–400)
RBC: 2.89 MIL/uL — ABNORMAL LOW (ref 3.87–5.11)
WBC: 12.3 10*3/uL — ABNORMAL HIGH (ref 4.0–10.5)
nRBC: 0 % (ref 0.0–0.2)

## 2022-01-20 LAB — MAGNESIUM: Magnesium: 1.2 mg/dL — ABNORMAL LOW (ref 1.7–2.4)

## 2022-01-20 MED ORDER — MAGNESIUM SULFATE 4 GM/100ML IV SOLN
4.0000 g | Freq: Once | INTRAVENOUS | Status: AC
  Administered 2022-01-20: 4 g via INTRAVENOUS
  Filled 2022-01-20: qty 100

## 2022-01-20 MED ORDER — POTASSIUM CHLORIDE CRYS ER 20 MEQ PO TBCR
40.0000 meq | EXTENDED_RELEASE_TABLET | ORAL | Status: AC
  Administered 2022-01-20 (×2): 40 meq via ORAL
  Filled 2022-01-20 (×2): qty 2

## 2022-01-20 NOTE — Progress Notes (Signed)
[facility] Progress Note  Patient: Laura [name]     XFG:[id number]  DOA: 12/27/2021   PCP: Sharon Seller, NP       Brief hospital course: This is a 61 year old female with anemia, hypertension, SVT, tobacco abuse and chronic back pain who presented to the hospital with a fall while trying to get to the bathroom.  He was on the floor for at least 1 day.  He was noted to be in SVT and hypotensive by EMS.  In the ED, hypotensive and in SVT.  Potassium 3.1, creatinine 2.1, ammonia 60, AST ALT mildly elevated, lactic acid 4.0. Admitted to the ICU for acute alcoholic hepatitis.  Treated for hepatorenal syndrome. Significant Hospital events.  9/9 -Admit s/p mechanical fall initial Hgb 5.0 > 2 U pRBC-Vancomycin and Cefepime started 9/11 Hgb 6.8 > 1U pRBC; Bcx 1 NG 2 days-pulled off vancomycin 9/12 1/2 Bcx grew staphylococcus species, possible contaminant 9/13 continued thrombocytopenia, hem onc consulted. Worsening encephalopathy-EEG severe encephalopathy/cefepime toxicity, CT head no acute issues, stopped cefepime switched to CTX 9/14 Increasing WBC-repeat Bcx, restart vancomycin and continue CTX-Bcx Staphylococcus warneri 1/2 likely contaminant, Cortrak placed, Albumin x 2, MAP goal of 80-85 possible hepatorenal 9/15 levophed, albumin and octreotide added for potential hepatorenal syndrome 9/16 NGT placed for N/V 9/17 Nephrology consult, recs: Continue with biopsy if clinically relevant, continue with rifaximin and Lactulose for hepatic encephalopathy 9/21.  GI has signed off.  No recommendation for liver biopsy. 9/25-nephrology treating for hepatorenal syndrome with octreotide and midodrine.  Improving creatinine levels. Weaned off of Midodrine - last dose 10/1 10/1 rectal tube removed 10/2 foley removed  Subjective:  No complaints.   Assessment and Plan: Principal Problem:  ETOH abuse with suspected alcoholic hepatitis  Chronic thrombocytopenia  Hepatomegaly and hepatic  steatosis noted on abdominal ultrasound on 9/9 - stopped drinking alcohol in Feb 2023 - LFTs improved - GI does not recommend a liver biopsy and signed off on 9/21 - platelets improving  Active Problems: Encephalopahty, multifactorial - treated with Thiamine for possible Wernicke's encephalopathy -- ? If Cefepime contributed - Receiving rifaximin  - dc lactulose 9/28 due to diarrhea -  quite alert and oriented now  Hepatorenal syndrome - treated with octreotide and Midodrine - have weaned off of Midodrine - renal function has normalized - removed foley 10/2- voiding well  Hypernatremia - improving after holding Sodium Bicarb tabs and starting D5 - dc D 5 today  Metabolic acidosis - resolved- dc'd Sodium Bicarb BID on 10/1   Ileus (resolved)-  - with subsequent diarrhea-- lactulose on hold - 1 dose of Lomotil on 9/30 - dc rectal tube 10/1 - he had numerous episodes of diarrhea documented on 10/2- none today per nursing   Hypokalemia and hypomagnesemia - low again today - replacing again today - per RN, no diarrhea today  Dysphagia - dc cor trak 9/27 - cont D 2 diet with thin liquids  H/o SVT - cont Metoprolol  Hypotension - required Levophed - Midodrine weaned off on 10/1  Anemia, macrocytic - has received numerous blood transfusions during this hospital stay  - FOBT + stools but no overt bleeding - Vit B12, folate normal  Nicotine abuse - smokes 1ppd  Disposition: nutrition/oral intake continues to be poor- palliative care discussion have been occurring> cont full scope  - will need SNF when electrolytes stabilize   DVT prophylaxis:  Place and maintain sequential compression device Start: 12/27/21 1918    Code Status: Full Code  Consultants: nephrology  Level of Care: Level of care: Progressive  Objective:   Vitals:   01/19/22 1728 01/19/22 1922 01/20/22 0721 01/20/22 1448  BP: (!) 148/68 136/81 (!) 147/81 (!) 143/97  Pulse:  66 (!) 57 73  Resp:  18 15    Temp:  98 F (36.7 C) 97.8 F (36.6 C) 97.8 F (36.6 C)  TempSrc:  Oral Oral Oral  SpO2:  100% 98% 100%  Weight:      Height:       Filed Weights   01/17/22 0500 01/18/22 0500 01/19/22 0451  Weight: 85.2 kg 81.1 kg 83.4 kg   Exam: General exam: Appears comfortable  HEENT: oral mucosa moist Respiratory system: Clear to auscultation.  Cardiovascular system: S1 & S2 heard  Gastrointestinal system: Abdomen soft, non-tender, nondistended. Normal bowel sounds   Extremities: No cyanosis, clubbing or edema Psychiatry:  Mood & affect appropriate.     Imaging and lab data was personally reviewed    CBC: Recent Labs  Lab 01/14/22 0344 01/17/22 0500 01/18/22 1059 01/20/22 0254  WBC 14.0* 11.2* 10.6* 12.3*  HGB 8.6* 8.5* 9.3* 9.6*  HCT 27.2* 27.8* 30.6* 31.7*  MCV 106.7* 110.3* 110.1* 109.7*  PLT 106* 122* 127* 134*    Basic Metabolic Panel: Recent Labs  Lab 01/14/22 0344 01/15/22 0315 01/16/22 0252 01/17/22 0500 01/18/22 1059 01/19/22 1135 01/20/22 0254  NA 146* 146* 144 148* 151*  150* 146* 144  K 3.3* 3.5 4.9 4.2 3.8  3.7 3.6 3.0*  CL 114* 107 108 114* 116*  116* 112* 109  CO2 22 24 23 25 26  26 26 27   GLUCOSE 161* 100* 107* 106* 121*  120* 121* 113*  BUN 97* 96* 89* 71* 49*  48* 30* 25*  CREATININE 2.09* 1.60* 1.28* 1.03* 0.92  0.85 0.78 0.59  CALCIUM 9.2 9.8 9.2 9.1 8.9  8.9 8.7* 8.4*  MG 1.6* 2.2 2.8*  --   --   --  1.2*  PHOS 3.5 3.8 4.7* 5.2* 5.1* 4.5 4.2    GFR: Estimated Creatinine Clearance (by C-G formula based on SCr of 0.59 mg/dL) Female: 84 mL/min Female: 102.7 mL/min  Scheduled Meds:  cholecalciferol  2,000 Units Oral Daily   darbepoetin (ARANESP) injection - NON-DIALYSIS  200 mcg Subcutaneous Q Thu-1800   feeding supplement  237 mL Oral BID BM   folic acid  1 mg Oral Daily   Gerhardt's butt cream   Topical BID   leptospermum manuka honey  1 Application Topical Daily   metoprolol tartrate  12.5 mg Oral BID   multivitamin  1  tablet Oral QHS   mouth rinse  15 mL Mouth Rinse 4 times per day   pantoprazole  40 mg Oral Daily   rifaximin  550 mg Oral TID   sodium chloride flush  10-40 mL Intracatheter Q12H   thiamine  100 mg Oral Daily   Continuous Infusions:  sodium chloride 10 mL/hr at 01/07/22 2000     LOS: 24 days   Author: Debbe Odea  01/20/2022 3:35 PM

## 2022-01-20 NOTE — Care Management Important Message (Signed)
Important Message  Patient Details  Name: Laura [name] MRN: [id number] Date of Birth: Jan 30, 1961   Medicare Important Message Given:  Yes     Hannah Beat 01/20/2022, 1:21 PM

## 2022-01-21 ENCOUNTER — Telehealth (HOSPITAL_COMMUNITY): Payer: Self-pay

## 2022-01-21 ENCOUNTER — Other Ambulatory Visit (HOSPITAL_COMMUNITY): Payer: Self-pay

## 2022-01-21 DIAGNOSIS — K7010 Alcoholic hepatitis without ascites: Secondary | ICD-10-CM | POA: Diagnosis not present

## 2022-01-21 LAB — RENAL FUNCTION PANEL
Albumin: 2.1 g/dL — ABNORMAL LOW (ref 3.5–5.0)
Anion gap: 7 (ref 5–15)
BUN: 15 mg/dL (ref 6–20)
CO2: 20 mmol/L — ABNORMAL LOW (ref 22–32)
Calcium: 8.1 mg/dL — ABNORMAL LOW (ref 8.9–10.3)
Chloride: 113 mmol/L — ABNORMAL HIGH (ref 98–111)
Creatinine, Ser: 0.53 mg/dL (ref 0.44–1.00)
GFR, Estimated: >60 mL/min (ref >60)
Glucose, Bld: 93 mg/dL (ref 70–99)
Phosphorus: 4.1 mg/dL (ref 2.5–4.6)
Potassium: 4.4 mmol/L (ref 3.5–5.1)
Sodium: 140 mmol/L (ref 135–145)

## 2022-01-21 LAB — MAGNESIUM: Magnesium: 1.6 mg/dL — ABNORMAL LOW (ref 1.7–2.4)

## 2022-01-21 MED ORDER — MAGNESIUM SULFATE 2 GM/50ML IV SOLN
2.0000 g | Freq: Once | INTRAVENOUS | Status: AC
  Administered 2022-01-21: 2 g via INTRAVENOUS
  Filled 2022-01-21: qty 50

## 2022-01-21 MED ORDER — ADULT MULTIVITAMIN W/MINERALS CH
1.0000 | ORAL_TABLET | Freq: Every day | ORAL | Status: DC
  Administered 2022-01-21 – 2022-01-22 (×2): 1 via ORAL
  Filled 2022-01-21 (×2): qty 1

## 2022-01-21 NOTE — Progress Notes (Signed)
Physical Therapy Treatment Patient Details Name: Laura [name] MRN: [id number] DOB: 29-Oct-1960 Today's Date: 01/21/2022   History of Present Illness Patient is a 61 y/o who presents on 9/9 with weakness and a fall. Admitted with severe anemia, Acute hypoxemic respiratory failure, septic shock, SVTs, hypotension, acute kidney injury, SIRS, mild rhabdomyolosis. PMH includes chronic back pain, alcohol use and tobacco use disorder.    PT Comments    Patient seems more alert this date but continues to have confusion and cognitive deficits. Attempted to come to EOB but large BM found and saturated in urine. Required maxA+2 for rolling L/R for pericare. Patient unaware of bowel movement. Patient fatigued after rolling and pericare and complaining of pain at buttock wound. Continue to recommend SNF for ongoing Physical Therapy.       Recommendations for follow up therapy are one component of a multi-disciplinary discharge planning process, led by the attending physician.  Recommendations may be updated based on patient status, additional functional criteria and insurance authorization.  Follow Up Recommendations  Skilled nursing-short term rehab (<3 hours/day) Can patient physically be transported by private vehicle: No   Assistance Recommended at Discharge Frequent or constant Supervision/Assistance  Patient can return home with the following Two people to help with walking and/or transfers;Two people to help with bathing/dressing/bathroom;Assistance with cooking/housework;Assistance with feeding;Direct supervision/assist for medications management;Direct supervision/assist for financial management;Assist for transportation;Help with stairs or ramp for entrance   Equipment Recommendations  None recommended by PT    Recommendations for Other Services       Precautions / Restrictions Precautions Precautions: Fall Restrictions Weight Bearing Restrictions: No     Mobility  Bed Mobility Overal  bed mobility: Needs Assistance Bed Mobility: Rolling Rolling: Max assist, +2 for physical assistance, +2 for safety/equipment         General bed mobility comments: Attempted to come to EOB but observed large bowel movement in bed and saturated in urine. Required maxA+2 for rolling L/R for pericare    Transfers                        Ambulation/Gait                   Stairs             Wheelchair Mobility    Modified Rankin (Stroke Patients Only)       Balance Overall balance assessment: Needs assistance                                          Cognition Arousal/Alertness: Awake/alert Behavior During Therapy: Flat affect, Anxious Overall Cognitive Status: No family/caregiver present to determine baseline cognitive functioning                                 General Comments: memory deficits noted during session. Disoriented to time and situation.        Exercises      General Comments        Pertinent Vitals/Pain Pain Assessment Pain Assessment: Faces Faces Pain Scale: Hurts even more Pain Location: L LE with movement, L hand Pain Descriptors / Indicators: Discomfort, Grimacing, Guarding, Moaning Pain Intervention(s): Monitored during session, Repositioned    Home Living  Prior Function            PT Goals (current goals can now be found in the care plan section) Acute Rehab PT Goals Patient Stated Goal: to get some sleep PT Goal Formulation: With patient Time For Goal Achievement: 01/30/22 Potential to Achieve Goals: Fair Progress towards PT goals: Progressing toward goals    Frequency    Min 2X/week      PT Plan Current plan remains appropriate    Co-evaluation              AM-PAC PT "6 Clicks" Mobility   Outcome Measure  Help needed turning from your back to your side while in a flat bed without using bedrails?: Total Help needed  moving from lying on your back to sitting on the side of a flat bed without using bedrails?: Total Help needed moving to and from a bed to a chair (including a wheelchair)?: Total Help needed standing up from a chair using your arms (e.g., wheelchair or bedside chair)?: Total Help needed to walk in hospital room?: Total Help needed climbing 3-5 steps with a railing? : Total 6 Click Score: 6    End of Session   Activity Tolerance: Patient tolerated treatment well Patient left: in bed;with call bell/phone within reach;with bed alarm set Nurse Communication: Mobility status PT Visit Diagnosis: Other abnormalities of gait and mobility (R26.89);Muscle weakness (generalized) (M62.81);Pain     Time: 6546-5035 PT Time Calculation (min) (ACUTE ONLY): 27 min  Charges:  $Therapeutic Activity: 23-37 mins                     [name] A. Dan Humphreys PT, DPT Acute Rehabilitation Services Office [phone]    [name] 01/21/2022, 2:28 PM

## 2022-01-21 NOTE — Progress Notes (Signed)
PROGRESS NOTE  Laura [name]  TDD:[id number] DOB: May 13, 1960 DOA: 12/27/2021 PCP: Lauree Chandler, NP   Brief Narrative:  Patient is a 61 year old female with history of anemia, alcohol use, hypertension, SVT, tobacco usage presented to the hospital after a fall while trying to get to the bathroom.  As per EMS, EKG rhythm demonstrated SVT, he was hypotensive.  On presentation he was hypertensive.  Lab work showed creatinine of 2.1, ammonia of 60, lactic acid of 4, potassium 3.1.  He was admitted to ICU low suspicion of acute abdominal hepatitis, treated for hepatorenal syndrome.  Significant Hospital events.  9/9 -Admit s/p mechanical fall initial Hgb 5.0 > 2 U pRBC-Vancomycin and Cefepime started 9/11 Hgb 6.8 > 1U pRBC; Bcx 1 NG 2 days-pulled off vancomycin 9/12 1/2 Bcx grew staphylococcus species, possible contaminant 9/13 continued thrombocytopenia, hem onc consulted. Worsening encephalopathy-EEG severe encephalopathy/cefepime toxicity, CT head no acute issues, stopped cefepime switched to CTX 9/14 Increasing WBC-repeat Bcx, restart vancomycin and continue CTX-Bcx Staphylococcus warneri 1/2 likely contaminant, Cortrak placed, Albumin x 2, MAP goal of 80-85 possible hepatorenal 9/15 levophed, albumin and octreotide added for potential hepatorenal syndrome 9/16 NGT placed for N/V 9/17 Nephrology consult, recs: Continue with biopsy if clinically relevant, continue with rifaximin and Lactulose for hepatic encephalopathy 9/21.  GI has signed off.  No recommendation for liver biopsy. 9/25-nephrology treating for hepatorenal syndrome with octreotide and midodrine.  Improving creatinine levels. Weaned off of Midodrine - last dose 10/1 10/1 rectal tube removed 10/2 foley removed  Assessment & Plan:  Principal Problem:   Hepatitis, alcoholic, acute Active Problems:   Hyperbilirubinemia   Anemia   Pressure injury of skin   Alcoholic cirrhosis of liver without ascites (HCC)   AKI (acute kidney  injury) ([place])   Thrombocytopenia (HCC)  Acute encephalopathy: Multifactorial, suspected to be from thiamine deficiency/Warnicke's encephalopathy.  Currently alert and oriented.  Chronic alcohol abuse/alcohol hepatitis: Presented with a fall.  Has hepatomegaly, hepatic steatosis.  LFTs have been stable.  Platelets level stable.  Denies recent alcohol intake.  Hepatorenal syndrome: Has been weaned off midodrine.  Continue rifaximin.  Renal function has normalized.  Foley removed.  Hyponatremia: Resolved  Ileus: Currently having bowel movement .lactulose discontinued.  Rectal tube discontinued  Hypokalemia/hypomagnesemia: Continue monitoring and supplementation  Dysphagia: Feeding tube discontinued 9/27.  Currently on solid diet.  SVT: EMS found that he was in SVT on presentation.  On metoprolol.  Currently in normal sinus rhythm  Hypotension: Initially required Levophed.  Currently blood pressure stable.  Midodrine weaned off  Macrocytic anemia: Numerous blood transfusion received during this hospital.  FOBT positive without any overt bleeding.  Vitamin B12, folate normal  Nicotine abuse: currently smokes 1 pack a day.  Counseled for cessation.  Goals of care: Palliative care consulted.  Currently remains full code  Debility/deconditioning: PT/OT recommended SNF on discharge.      Nutrition Problem: Increased nutrient needs Etiology: acute illness, wound healing Pressure Injury 12/27/21 Buttocks Left Deep Tissue Pressure Injury - Purple or maroon localized area of discolored intact skin or blood-filled blister due to damage of underlying soft tissue from pressure and/or shear. Half circle on back of thigh, red (Active)  12/27/21 2126  Location: Buttocks  Location Orientation: Left  Staging: Deep Tissue Pressure Injury - Purple or maroon localized area of discolored intact skin or blood-filled blister due to damage of underlying soft tissue from pressure and/or shear.  Wound  Description (Comments): Half circle on back of thigh, red  Present on  Admission: Yes  Dressing Type Foam - Lift dressing to assess site every shift 01/20/22 2209     Pressure Injury 12/27/21 Buttocks Right Deep Tissue Pressure Injury - Purple or maroon localized area of discolored intact skin or blood-filled blister due to damage of underlying soft tissue from pressure and/or shear. Half circle on back of thigh, red  (Active)  12/27/21 2126  Location: Buttocks  Location Orientation: Right  Staging: Deep Tissue Pressure Injury - Purple or maroon localized area of discolored intact skin or blood-filled blister due to damage of underlying soft tissue from pressure and/or shear.  Wound Description (Comments): Half circle on back of thigh, red and bleeding  Present on Admission: Yes  Dressing Type Foam - Lift dressing to assess site every shift 01/20/22 2209     Pressure Injury 01/06/22 Hip Left Deep Tissue Pressure Injury - Purple or maroon localized area of discolored intact skin or blood-filled blister due to damage of underlying soft tissue from pressure and/or shear. blister is now open (Active)  01/06/22 2000  Location: Hip  Location Orientation: Left  Staging: Deep Tissue Pressure Injury - Purple or maroon localized area of discolored intact skin or blood-filled blister due to damage of underlying soft tissue from pressure and/or shear.  Wound Description (Comments): blister is now open  Present on Admission: Yes  Dressing Type Foam - Lift dressing to assess site every shift 01/20/22 2209    DVT prophylaxis:Place and maintain sequential compression device Start: 12/27/21 1918 SCDs Start: 12/27/21 1828     Code Status: Full Code  Family Communication: None at bedside  Patient status:Inpatient  Patient is from :Home  Anticipated discharge to:SNF  Estimated DC date:1-2 days,waiting for bed.  Medically stable for discharge   Consultants:  PCCM  Procedures:None  Antimicrobials:  Anti-infectives (From admission, onward)    Start     Dose/Rate Route Frequency Ordered Stop   01/15/22 1000  rifaximin (XIFAXAN) tablet 550 mg        550 mg Oral 3 times daily 01/15/22 0823     01/05/22 2200  rifaximin (XIFAXAN) tablet 550 mg  Status:  Discontinued        550 mg Per Tube 3 times daily 01/05/22 2016 01/15/22 0823   01/03/22 1000  vancomycin (VANCOREADY) IVPB 1500 mg/300 mL  Status:  Discontinued        1,500 mg 150 mL/hr over 120 Minutes Intravenous Every 48 hours 01/01/22 0902 01/03/22 0757   01/01/22 1000  vancomycin (VANCOREADY) IVPB 1500 mg/300 mL        1,500 mg 150 mL/hr over 120 Minutes Intravenous  Once 01/01/22 0858 01/01/22 1159   12/31/21 2200  cefTRIAXone (ROCEPHIN) 2 g in sodium chloride 0.9 % 100 mL IVPB  Status:  Discontinued        2 g 200 mL/hr over 30 Minutes Intravenous Every 24 hours 12/31/21 1332 01/03/22 0906   12/28/21 2300  vancomycin (VANCOREADY) IVPB 750 mg/150 mL  Status:  Discontinued        750 mg 150 mL/hr over 60 Minutes Intravenous Every 24 hours 12/28/21 1515 12/29/21 0954   12/27/21 1930  vancomycin (VANCOREADY) IVPB 1500 mg/300 mL        1,500 mg 150 mL/hr over 120 Minutes Intravenous  Once 12/27/21 1921 12/28/21 0156   12/27/21 1930  ceFEPIme (MAXIPIME) 2 g in sodium chloride 0.9 % 100 mL IVPB  Status:  Discontinued        2 g 200 mL/hr over 30 Minutes  Intravenous Every 12 hours 12/27/21 1921 12/31/21 1332   12/27/21 1918  vancomycin variable dose per unstable renal function (pharmacist dosing)  Status:  Discontinued         Does not apply See admin instructions 12/27/21 1921 12/28/21 1515       Subjective: Seen and examined at the bedside today.  Hemodynamically stable.  Alert and oriented.  Denies any complaints.  Appears comfortable, lying in bed  Objective: Vitals:   01/20/22 2209 01/21/22 0500 01/21/22 0714 01/21/22 0858  BP: (!) 135/95  106/79 (!) 149/75  Pulse: 67   60   Resp:    15  Temp: 97.8 F (36.6 C)     TempSrc: Oral     SpO2: 100%   100%  Weight:  85.2 kg    Height:        Intake/Output Summary (Last 24 hours) at 01/21/2022 0920 Last data filed at 01/21/2022 0700 Gross per 24 hour  Intake 93.86 ml  Output 800 ml  Net -706.14 ml   Filed Weights   01/18/22 0500 01/19/22 0451 01/21/22 0500  Weight: 81.1 kg 83.4 kg 85.2 kg    Examination:  General exam: Overall comfortable, not in distress, deconditioned, weak HEENT: PERRL Respiratory system:  no wheezes or crackles  Cardiovascular system: S1 & S2 heard, RRR.  Gastrointestinal system: Abdomen is nondistended, soft and nontender. Central nervous system: Alert and oriented Extremities: No edema, no clubbing ,no cyanosis Skin: No rashes, no ulcers,no icterus     Data Reviewed: I have personally reviewed following labs and imaging studies  CBC: Recent Labs  Lab 01/17/22 0500 01/18/22 1059 01/20/22 0254  WBC 11.2* 10.6* 12.3*  HGB 8.5* 9.3* 9.6*  HCT 27.8* 30.6* 31.7*  MCV 110.3* 110.1* 109.7*  PLT 122* 127* [id number]*   Basic Metabolic Panel: Recent Labs  Lab 01/15/22 0315 01/16/22 0252 01/17/22 0500 01/18/22 1059 01/19/22 1135 01/20/22 0254 01/21/22 0328  NA 146* 144 148* 151*  150* 146* 144 140  K 3.5 4.9 4.2 3.8  3.7 3.6 3.0* 4.4  CL 107 108 114* 116*  116* 112* 109 113*  CO2 24 23 25 26  26 26 27  20*  GLUCOSE 100* 107* 106* 121*  120* 121* 113* 93  BUN 96* 89* 71* 49*  48* 30* 25* 15  CREATININE 1.60* 1.28* 1.03* 0.92  0.85 0.78 0.59 0.53  CALCIUM 9.8 9.2 9.1 8.9  8.9 8.7* 8.4* 8.1*  MG 2.2 2.8*  --   --   --  1.2* 1.6*  PHOS 3.8 4.7* 5.2* 5.1* 4.5 4.2 4.1     No results found for this or any previous visit (from the past 240 hour(s)).   Radiology Studies: No results found.  Scheduled Meds:  cholecalciferol  2,000 Units Oral Daily   darbepoetin (ARANESP) injection - NON-DIALYSIS  200 mcg Subcutaneous Q Thu-1800   feeding supplement  237 mL Oral BID  BM   folic acid  1 mg Oral Daily   Gerhardt's butt cream   Topical BID   leptospermum manuka honey  1 Application Topical Daily   metoprolol tartrate  12.5 mg Oral BID   multivitamin  1 tablet Oral QHS   mouth rinse  15 mL Mouth Rinse 4 times per day   rifaximin  550 mg Oral TID   sodium chloride flush  10-40 mL Intracatheter Q12H   thiamine  100 mg Oral Daily   Continuous Infusions:  sodium chloride 10 mL/hr at 01/07/22 2000   magnesium  sulfate bolus IVPB 2 g (01/21/22 0858)     LOS: 25 days   Shelly Coss, MD [facility] P10/07/2021, 9:20 AM

## 2022-01-21 NOTE — Telephone Encounter (Signed)
Received notification from [facility] regarding a prior authorization for Xifaxan 550mg .     Authorization has been APPROVED from 01/21/2022 to 04/20/2023.   Per test claim, copay for 10 days supply is $0.   Authorization # [id number]  Key #[id number]

## 2022-01-21 NOTE — TOC Progression Note (Addendum)
Transition of Care [facility]) - Progression Note    Patient Details  Name: Laura [name] MRN: [id number] Date of Birth: Nov 09, 1960  [facility] CM/SW Contact  Joanne Chars, LCSW Phone Number: 01/21/2022, 9:59 AM  Clinical Narrative:   CSW informed that they no long have private room available and do not anticipate another for some time.  CSW LM with son [name] with this update and asked him about remaining bed offers at [facility] and [facility].  1145: Private room not available today at [facility], they are checking for tomorrow.  Private room is available at [facility].  CSW spoke with son, he will come up from [place] tomorrow and visit both places and make decision.    1500: auth request submitted in [place] with facility choice pending    Barriers to Discharge: Continued Medical Work up  Expected Discharge Plan and Services     Discharge Planning Services: CM Consult   Living arrangements for the past 2 months: Apartment                                       Social Determinants of Health (SDOH) Interventions    Readmission Risk Interventions    07/02/2021   11:32 AM  Readmission Risk Prevention Plan  Post Dischage Appt Complete  Medication Screening Complete  Transportation Screening Complete

## 2022-01-21 NOTE — Plan of Care (Signed)
°  Problem: Education: °Goal: Knowledge of General Education information will improve °Description: Including pain rating scale, medication(s)/side effects and non-pharmacologic comfort measures °Outcome: Progressing °  °Problem: Health Behavior/Discharge Planning: °Goal: Ability to manage health-related needs will improve °Outcome: Progressing °  °Problem: Clinical Measurements: °Goal: Ability to maintain clinical measurements within normal limits will improve °Outcome: Progressing °Goal: Diagnostic test results will improve °Outcome: Progressing °  °Problem: Skin Integrity: °Goal: Risk for impaired skin integrity will decrease °Outcome: Progressing °  °

## 2022-01-21 NOTE — Progress Notes (Signed)
Nutrition Follow-up  DOCUMENTATION CODES:   Not applicable  INTERVENTION:  Continue current diet as ordered Continue Ensure Enlive po BID, each supplement provides 350 kcal and 20 grams of protein. Transition pt from Rena-vit to MVI with minerals daily d/t renal labs being WNL  NUTRITION DIAGNOSIS:   Increased nutrient needs related to acute illness, wound healing as evidenced by estimated needs.  Ongoing  GOAL:   Patient will meet greater than or equal to 90% of their needs  Goal not met  MONITOR:   PO intake, Supplement acceptance, Diet advancement, Labs, Weight trends, Skin  REASON FOR ASSESSMENT:   Consult Enteral/tube feeding initiation and management  ASSESSMENT:   Pt admitted with weakness, septic shock and SVT. PMH significant for chronic anemia and hx EtOH use  Spoke with pt at bedside. He reports doing much better. He is happy that he is no longer receiving tube feeding. Endorses eating well, at least 75% of each meal. Pt also reports drinking Ensure 2-3 times daily. However, per review of MAR, pt intermittently refusing these supplements. Will continue as ordered in order to try to optimize nutritional intake.  Meal completions: 10/4: 75% breakfast, 50% lunch  Admit weight: 63.6 kg Current weight: 85.2 kg  Edema: mild pitting generalized, non-pitting BLE  Medications: Vitamin D3, folvite, medihoney, rena-vit, thiamine   Labs: Mg 1.6 (replacing)  Diet Order:   Diet Order             Diet regular Room service appropriate? Yes with Assist; Fluid consistency: Thin  Diet effective now                   EDUCATION NEEDS:   No education needs have been identified at this time  Skin:  Skin Assessment: Skin Integrity Issues: Skin Integrity Issues:: DTI DTI: bilat buttocks  Last BM:  10/4 (type 6/type 7)  Height:   Ht Readings from Last 1 Encounters:  01/01/22 '5\' 7"'  (1.702 m)    Weight:   Wt Readings from Last 1 Encounters:  01/21/22  85.2 kg    Ideal Body Weight:  61.4 kg  BMI:  Body mass index is 29.42 kg/m.  Estimated Nutritional Needs:   Kcal:  1800-2000  Protein:  100-115g  Fluid:  >/=1.8L  Clayborne Dana, RDN, LDN Clinical Nutrition

## 2022-01-22 DIAGNOSIS — G890 Central pain syndrome: Secondary | ICD-10-CM | POA: Diagnosis not present

## 2022-01-22 DIAGNOSIS — L89313 Pressure ulcer of right buttock, stage 3: Secondary | ICD-10-CM | POA: Diagnosis not present

## 2022-01-22 DIAGNOSIS — I10 Essential (primary) hypertension: Secondary | ICD-10-CM | POA: Diagnosis not present

## 2022-01-22 DIAGNOSIS — I959 Hypotension, unspecified: Secondary | ICD-10-CM | POA: Diagnosis not present

## 2022-01-22 DIAGNOSIS — Z7401 Bed confinement status: Secondary | ICD-10-CM | POA: Diagnosis not present

## 2022-01-22 DIAGNOSIS — K219 Gastro-esophageal reflux disease without esophagitis: Secondary | ICD-10-CM | POA: Diagnosis not present

## 2022-01-22 DIAGNOSIS — R279 Unspecified lack of coordination: Secondary | ICD-10-CM | POA: Diagnosis not present

## 2022-01-22 DIAGNOSIS — R262 Difficulty in walking, not elsewhere classified: Secondary | ICD-10-CM | POA: Diagnosis not present

## 2022-01-22 DIAGNOSIS — R2689 Other abnormalities of gait and mobility: Secondary | ICD-10-CM | POA: Diagnosis not present

## 2022-01-22 DIAGNOSIS — R5381 Other malaise: Secondary | ICD-10-CM | POA: Diagnosis not present

## 2022-01-22 DIAGNOSIS — L8990 Pressure ulcer of unspecified site, unspecified stage: Secondary | ICD-10-CM | POA: Diagnosis not present

## 2022-01-22 DIAGNOSIS — M7120 Synovial cyst of popliteal space [Baker], unspecified knee: Secondary | ICD-10-CM | POA: Diagnosis not present

## 2022-01-22 DIAGNOSIS — E559 Vitamin D deficiency, unspecified: Secondary | ICD-10-CM | POA: Diagnosis not present

## 2022-01-22 DIAGNOSIS — R531 Weakness: Secondary | ICD-10-CM | POA: Diagnosis not present

## 2022-01-22 DIAGNOSIS — M6281 Muscle weakness (generalized): Secondary | ICD-10-CM | POA: Diagnosis not present

## 2022-01-22 DIAGNOSIS — D649 Anemia, unspecified: Secondary | ICD-10-CM | POA: Diagnosis not present

## 2022-01-22 DIAGNOSIS — K7010 Alcoholic hepatitis without ascites: Secondary | ICD-10-CM | POA: Diagnosis not present

## 2022-01-22 DIAGNOSIS — F32A Depression, unspecified: Secondary | ICD-10-CM | POA: Diagnosis not present

## 2022-01-22 DIAGNOSIS — L89320 Pressure ulcer of left buttock, unstageable: Secondary | ICD-10-CM | POA: Diagnosis not present

## 2022-01-22 DIAGNOSIS — Z743 Need for continuous supervision: Secondary | ICD-10-CM | POA: Diagnosis not present

## 2022-01-22 LAB — RENAL FUNCTION PANEL
Albumin: 2 g/dL — ABNORMAL LOW (ref 3.5–5.0)
Anion gap: 5 (ref 5–15)
BUN: 11 mg/dL (ref 6–20)
CO2: 25 mmol/L (ref 22–32)
Calcium: 8 mg/dL — ABNORMAL LOW (ref 8.9–10.3)
Chloride: 110 mmol/L (ref 98–111)
Creatinine, Ser: 0.55 mg/dL (ref 0.44–1.00)
GFR, Estimated: >60 mL/min (ref >60)
Glucose, Bld: 97 mg/dL (ref 70–99)
Phosphorus: 3.9 mg/dL (ref 2.5–4.6)
Potassium: 3.5 mmol/L (ref 3.5–5.1)
Sodium: 140 mmol/L (ref 135–145)

## 2022-01-22 MED ORDER — POTASSIUM CHLORIDE CRYS ER 20 MEQ PO TBCR: 20.0000 meq | EXTENDED_RELEASE_TABLET | Freq: Every day | ORAL | 0 refills | Status: AC

## 2022-01-22 MED ORDER — VITAMIN B-1 100 MG PO TABS: 100.0000 mg | ORAL_TABLET | Freq: Every day | ORAL | Status: DC

## 2022-01-22 MED ORDER — ENSURE ENLIVE PO LIQD: 237.0000 mL | Freq: Two times a day (BID) | ORAL | 12 refills | Status: DC

## 2022-01-22 MED ORDER — VITAMIN D3 25 MCG PO TABS: 2000.0000 [IU] | ORAL_TABLET | Freq: Every day | ORAL | Status: DC

## 2022-01-22 MED ORDER — RIFAXIMIN 550 MG PO TABS: 550.0000 mg | ORAL_TABLET | Freq: Three times a day (TID) | ORAL | Status: DC

## 2022-01-22 NOTE — Discharge Summary (Signed)
Physician Discharge Summary  Laura [name] ZOX:[id number] DOB: [birth year] DOA: 12/27/2021  PCP: Sharon SellerEubanks, Jessica K, NP  Admit date: 12/27/2021 Discharge date: 01/22/2022  Admitted From: Home Disposition:  SNF  Discharge Condition:Stable CODE STATUS:FULL Diet recommendation:  Regular  Brief/Interim Summary:  Patient is a 61 year old female with history of anemia, alcohol use, hypertension, SVT, tobacco usage presented to the hospital after a fall while trying to get to the bathroom.  As per EMS, EKG rhythm demonstrated SVT, he was hypotensive.  On presentation he was hypertensive.  Lab work showed creatinine of 2.1, ammonia of 60, lactic acid of 4, potassium 3.1.  He was admitted to ICU with  suspicion of acute alcoholic  hepatitis, treated for hepatorenal syndrome.Patient had a prolonged hospitalization, remarkable for persistent encephalopathy.  Currently the mental status has improved, PT/OT recommending skilled facility on discharge.  Medically stable for discharge to SNF today.  Following problems were addressed during  hospitalization:  Acute encephalopathy: Multifactorial, suspected to be from thiamine deficiency/Warnicke's encephalopathy.  Currently alert and oriented.   Chronic alcohol abuse/alcohol hepatitis: Presented with a fall.  Has hepatomegaly, hepatic steatosis.  LFTs have been stable.  Platelets level stable.  Denies recent alcohol intake.   Hepatorenal syndrome: Has been weaned off midodrine.  Continue rifaximin.  Renal function has normalized.  Foley removed.   Hyponatremia: Resolved   Ileus: Currently having bowel movement .lactulose discontinued.  Rectal tube discontinued   Hypokalemia/hypomagnesemia: Continue monitoring and supplementation   Dysphagia: Feeding tube discontinued 9/27.  Currently on solid diet.   SVT: EMS found that he was in SVT on presentation.  On metoprolol.  Currently in normal sinus rhythm   Hypotension: Initially required Levophed.  Currently blood  pressure stable.  Midodrine weaned off   Macrocytic anemia: Numerous blood transfusion received during this hospital.  FOBT positive without any overt bleeding.  Vitamin B12, folate normal   Nicotine abuse: currently smokes 1 pack a day.  Counseled for cessation.   Goals of care: Palliative care consulted.  Currently remains full code   Debility/deconditioning: PT/OT recommended SNF on discharge.     Discharge Diagnoses:  Principal Problem:   Hepatitis, alcoholic, acute Active Problems:   Hyperbilirubinemia   Anemia   Pressure injury of skin   Alcoholic cirrhosis of liver without ascites (HCC)   AKI (acute kidney injury) (HCC)   Thrombocytopenia (HCC)    Discharge Instructions  Discharge Instructions     Diet general   Complete by: As directed    Discharge instructions   Complete by: As directed    1)Please take prescribed medications as instructed 2)Do a CBC and BMP tests in a week   Discharge wound care:   Complete by: As directed    As per wound care nurse   Increase activity slowly   Complete by: As directed       Allergies as of 01/22/2022       Reactions   Garlic Anaphylaxis   Aspirin Hives   Lyrica [pregabalin]    Nightmares, did not help neuropathy   Penicillins Hives   Tolerated ceftriaxone 12/2021        Medication List     STOP taking these medications    ciprofloxacin 0.3 % ophthalmic solution Commonly known as: CILOXAN   gabapentin 100 MG capsule Commonly known as: NEURONTIN   oxyCODONE-acetaminophen 5-325 MG tablet Commonly known as: PERCOCET/ROXICET   Vitamin D (Ergocalciferol) 1.25 MG (50000 UNIT) Caps capsule Commonly known as: DRISDOL  TAKE these medications    DULoxetine 30 MG capsule Commonly known as: CYMBALTA TAKE 1 CAPSULE(30 MG) BY MOUTH DAILY What changed: See the new instructions.   feeding supplement Liqd Take 237 mLs by mouth 2 (two) times daily between meals.   ferrous sulfate 325 (65 FE) MG  tablet Take 325 mg by mouth 2 (two) times daily with a meal.   folic acid 1 MG tablet Commonly known as: FOLVITE Take 1 tablet (1 mg total) by mouth daily.   furosemide 20 MG tablet Commonly known as: LASIX TAKE 1 TABLET(20 MG) BY MOUTH DAILY What changed: See the new instructions.   magnesium oxide 400 MG tablet Commonly known as: MAG-OX Take 1 tablet (400 mg total) by mouth 2 (two) times daily.   metoprolol tartrate 25 MG tablet Commonly known as: LOPRESSOR TAKE 1/2 TABLET(12.5 MG) BY MOUTH TWICE DAILY What changed: See the new instructions.   nystatin powder Commonly known as: nystatin Apply 1 Application topically 3 (three) times daily.   pantoprazole 40 MG tablet Commonly known as: PROTONIX TAKE 1 TABLET BY MOUTH DAILY   potassium chloride SA 20 MEQ tablet Commonly known as: KLOR-CON M Take 1 tablet (20 mEq total) by mouth daily for 7 days.   rifaximin 550 MG Tabs tablet Commonly known as: XIFAXAN Take 1 tablet (550 mg total) by mouth 3 (three) times daily.   thiamine 100 MG tablet Commonly known as: Vitamin B-1 Take 1 tablet (100 mg total) by mouth daily. Start taking on: January 23, 2022   VISINE OP Place 1 drop into both eyes daily as needed (For dry eyes).   vitamin D3 25 MCG tablet Commonly known as: CHOLECALCIFEROL Take 2 tablets (2,000 Units total) by mouth daily. Start taking on: January 23, 2022               Discharge Care Instructions  (From admission, onward)           Start     Ordered   01/22/22 0000  Discharge wound care:       Comments: As per wound care nurse   01/22/22 1044            Contact information for after-discharge care     Destination     [facility] .   Service: Skilled Nursing Contact information: [address] [place] [address] [phone]                    Allergies  Allergen Reactions   Garlic Anaphylaxis   Aspirin Hives   Lyrica  [Pregabalin]     Nightmares, did not help neuropathy   Penicillins Hives    Tolerated ceftriaxone 12/2021    Consultations: PCCM   Procedures/Studies: DG Abd Portable 1V  Result Date: 01/07/2022 CLINICAL DATA:  Abdominal stent shin. EXAM: PORTABLE ABDOMEN - 1 VIEW COMPARISON:  01/03/2022 FINDINGS: Feeding tube tip is in the mid stomach. NG tube tip is in the stomach with tip overlying the cardia. Proximal side port of the NG tube is well below the GE junction. Interval decrease in gaseous bowel distension seen on the prior study with no gaseous dilatation currently to suggest bowel obstruction. IMPRESSION: 1. Feeding tube tip is in the mid stomach. 2. NG tube tip is in the stomach. 3. No gaseous bowel dilatation to suggest obstruction. Electronically Signed   By: Kennith Center M.D.   On: 01/07/2022 05:55   DG Abd 1 View  Result  Date: 01/03/2022 CLINICAL DATA:  Vomiting, orogastric tube placement. EXAM: ABDOMEN - 1 VIEW COMPARISON:  Abdominal radiograph dated 01/01/2022. FINDINGS: There has been interval placement of an enteric tube which terminates in the stomach. An enteric tube which was previously in position in the stomach is unchanged. IMPRESSION: Two enteric tubes terminate in the stomach. Electronically Signed   By: Romona Curls M.D.   On: 01/03/2022 12:17   DG Abd Portable 1V  Result Date: 01/01/2022 CLINICAL DATA:  Feeding tube placement. EXAM: PORTABLE ABDOMEN - 1 VIEW COMPARISON:  None Available. FINDINGS: Distal tip of feeding tube is seen in expected position of proximal stomach. IMPRESSION: Distal tip of feeding tube is seen in expected position of proximal stomach. Electronically Signed   By: Lupita Raider M.D.   On: 01/01/2022 15:02   EEG adult  Result Date: 12/31/2021 Charlsie Quest, MD     12/31/2021  4:20 PM Patient Name: Veronda Gabor MRN: [id number] Epilepsy Attending: Charlsie Quest Referring Physician/Provider: Levin Erp, MD Date: 12/31/2021 Duration: 23.57  mins Patient history: 61 year old with altered mental status.  EEG to evaluate for seizure. Level of alertness: lethargic AEDs during EEG study: None Technical aspects: This EEG study was done with scalp electrodes positioned according to the 10-20 International system of electrode placement. Electrical activity was reviewed with band pass filter of 1-70Hz , sensitivity of 7 uV/mm, display speed of 76mm/sec with a 60Hz  notched filter applied as appropriate. EEG data were recorded continuously and digitally stored.  Video monitoring was available and reviewed as appropriate. Description: EEG showed continuous generalized 3-5hz  theta-delta slowing.  Generalized periodic discharges with triphasic morphology were noted at 1 Hz.  Hyperventilation and photic stimulation were not performed.   ABNORMALITY - Periodic discharges with triphasic morphology, generalized ( GPDs) - Continuous slow, generalized IMPRESSION: This study showed generalized periodic discharges with triphasic morphology at 1 Hz.  At times, this EEG pattern can be on the ictal pattern interictal continuum.  However given the morphology and frequency this is more commonly indicative of toxic-metabolic causes like cefepime toxicity.  Additionally there is moderate to severe diffuse encephalopathy. No definite seizures were seen during the study. Priyanka   CT HEAD WO CONTRAST (Annabelle Harman)  Result Date: 12/31/2021 CLINICAL DATA:  encephalopathy EXAM: CT HEAD WITHOUT CONTRAST TECHNIQUE: Contiguous axial images were obtained from the base of the skull through the vertex without intravenous contrast. RADIATION DOSE REDUCTION: This exam was performed according to the departmental dose-optimization program which includes automated exposure control, adjustment of the mA and/or kV according to patient size and/or use of iterative reconstruction technique. COMPARISON:  None Available. FINDINGS: Brain: No evidence of acute infarction, hemorrhage, hydrocephalus,  extra-axial collection or mass lesion/mass effect. Partially empty sella. Cerebral atrophy. Patchy white matter hypodensities, nonspecific but compatible with microvascular ischemic disease. Vascular: No hyperdense vessel identified. Skull: No acute fracture. Sinuses/Orbits: Left sphenoid sinus mucosal thickening. No acute orbital findings. Other: No mastoid effusions. IMPRESSION: 1. No evidence of acute intracranial abnormality. 2. Partially empty sella which is often a normal anatomic variant but can be associated with idiopathic intracranial hypertension. Electronically Signed   By: 01/02/2022 M.D.   On: 12/31/2021 10:51   DG CHEST PORT 1 VIEW  Result Date: 12/30/2021 CLINICAL DATA:  Shortness of breath. EXAM: PORTABLE CHEST 1 VIEW COMPARISON:  12/27/2021 FINDINGS: The cardiac silhouette, mediastinal and hilar contours are within limits and stable. New left-sided PICC line is noted with its tip in the right atrium. This could  be retracted approximately 3 cm. There are new bibasilar airspace opacities suspicious for pneumonia. Aspiration would be a consideration. IMPRESSION: 1. New left-sided PICC line with its tip in the right atrium. This could be retracted approximately 3 cm. 2. New bibasilar infiltrates. Electronically Signed   By: Rudie Meyer M.D.   On: 12/30/2021 08:38   [place] EKG SITE RITE  Result Date: 12/28/2021 If Site Rite image not attached, placement could not be confirmed due to current cardiac rhythm.  ECHOCARDIOGRAM COMPLETE  Result Date: 12/28/2021    ECHOCARDIOGRAM REPORT   Patient Name:   SHENEKA SCHROM Date of Exam: 12/28/2021 Medical Rec #:  [id number]  Height:       67.0 in Accession #:    [id number] Weight:       140.2 lb Date of Birth:  Oct 14, 1960  BSA:          1.739 m Patient Age:    61 years   BP:           95/61 mmHg Patient Gender: F          HR:           87 bpm. Exam Location:  Inpatient Procedure: 2D Echo, Color Doppler and Cardiac Doppler Indications:    Shock  History:         Patient has prior history of Echocardiogram examinations, most                 recent 07/30/2021.  Sonographer:    Gaynell Face Referring Phys: [id number] CHI JANE ELLISON IMPRESSIONS  1. Left ventricular ejection fraction, by estimation, is 60 to 65%. The left ventricle has normal function. The left ventricle has no regional wall motion abnormalities. Left ventricular diastolic parameters were normal.  2. Right ventricular systolic function is normal. The right ventricular size is normal. There is mildly elevated pulmonary artery systolic pressure. The estimated right ventricular systolic pressure is 37.6 mmHg.  3. The mitral valve is normal in structure. Trivial mitral valve regurgitation.  4. The aortic valve is tricuspid. Aortic valve regurgitation is not visualized. No aortic stenosis is present.  5. The inferior vena cava is normal in size with greater than 50% respiratory variability, suggesting right atrial pressure of 3 mmHg. Comparison(s): No significant change since prior TTE in 07/2021. FINDINGS  Left Ventricle: Left ventricular ejection fraction, by estimation, is 60 to 65%. The left ventricle has normal function. The left ventricle has no regional wall motion abnormalities. The left ventricular internal cavity size was normal in size. There is  no left ventricular hypertrophy. Left ventricular diastolic parameters were normal. Right Ventricle: The right ventricular size is normal. No increase in right ventricular wall thickness. Right ventricular systolic function is normal. There is mildly elevated pulmonary artery systolic pressure. The tricuspid regurgitant velocity is 2.94  m/s, and with an assumed right atrial pressure of 3 mmHg, the estimated right ventricular systolic pressure is 37.6 mmHg. Left Atrium: Left atrial size was normal in size. Right Atrium: Right atrial size was normal in size. Pericardium: There is no evidence of pericardial effusion. Mitral Valve: The mitral valve is normal in  structure. Trivial mitral valve regurgitation. Tricuspid Valve: The tricuspid valve is normal in structure. Tricuspid valve regurgitation is mild. Aortic Valve: The aortic valve is tricuspid. Aortic valve regurgitation is not visualized. No aortic stenosis is present. Aortic valve mean gradient measures 4.0 mmHg. Aortic valve peak gradient measures 6.6 mmHg. Aortic valve area, by VTI measures 2.37 cm. Pulmonic Valve:  The pulmonic valve was normal in structure. Pulmonic valve regurgitation is trivial. Aorta: The aortic root is normal in size and structure. Venous: The inferior vena cava is normal in size with greater than 50% respiratory variability, suggesting right atrial pressure of 3 mmHg. IAS/Shunts: The atrial septum is grossly normal.  LEFT VENTRICLE PLAX 2D LVIDd:         3.90 cm   Diastology LVIDs:         2.60 cm   LV e' medial:    7.51 cm/s LV PW:         1.30 cm   LV E/e' medial:  9.8 LV IVS:        1.00 cm   LV e' lateral:   11.60 cm/s LVOT diam:     2.00 cm   LV E/e' lateral: 6.4 LV SV:         50 LV SV Index:   29 LVOT Area:     3.14 cm  RIGHT VENTRICLE TAPSE (M-mode): 1.9 cm LEFT ATRIUM             Index        RIGHT ATRIUM          Index LA diam:        2.60 cm 1.50 cm/m   RA Area:     9.97 cm LA Vol (A2C):   31.2 ml 17.94 ml/m  RA Volume:   21.50 ml 12.36 ml/m LA Vol (A4C):   37.6 ml 21.62 ml/m LA Biplane Vol: 34.6 ml 19.90 ml/m  AORTIC VALVE AV Area (Vmax):    2.58 cm AV Area (Vmean):   2.33 cm AV Area (VTI):     2.37 cm AV Vmax:           128.00 cm/s AV Vmean:          86.000 cm/s AV VTI:            0.211 m AV Peak Grad:      6.6 mmHg AV Mean Grad:      4.0 mmHg LVOT Vmax:         105.00 cm/s LVOT Vmean:        63.900 cm/s LVOT VTI:          0.159 m LVOT/AV VTI ratio: 0.75  AORTA Ao Root diam: 3.50 cm Ao Asc diam:  3.00 cm MITRAL VALVE               TRICUSPID VALVE MV Area (PHT): 3.99 cm    TR Peak grad:   34.6 mmHg MV Decel Time: 190 msec    TR Vmax:        294.00 cm/s MV E velocity:  73.90 cm/s MV A velocity: 65.00 cm/s  SHUNTS MV E/A ratio:  1.14        Systemic VTI:  0.16 m                            Systemic Diam: 2.00 cm Laurance Flatten MD Electronically signed by Laurance Flatten MD Signature Date/Time: 12/28/2021/1:01:43 PM    Final    US LIVER DOPPLER  Result Date: 12/27/2021 CLINICAL DATA:  Right upper quadrant pain EXAM: DUPLEX ULTRASOUND OF LIVER TECHNIQUE: Color and duplex Doppler ultrasound was performed to evaluate the hepatic in-flow and out-flow vessels. COMPARISON:  MRCP 07/30/2021 FINDINGS: Liver: Hepatomegaly and hepatic steatosis. No focal lesion, mass or intrahepatic biliary ductal dilatation. Main Portal Vein size:  1.0 cm Portal Vein Velocities Main Prox:  24 cm/sec Main Mid: 21 cm/sec Main Dist:  24 cm/sec Right: Unable to obtain due to patient cooperation Left: Unable to obtain due to patient cooperation Hepatic Vein Velocities Right:  50 cm/sec Middle:  76 cm/sec Left:  26 cm/sec IVC: Present and patent with normal respiratory phasicity. Hepatic Artery Velocity:  127 cm/sec Splenic Vein Velocity:  Unable to obtain Spleen: 9.2 x 6.9 x 4.8 cm with a total volume of 157.6 cm^3 (411 cm^3 is upper limit normal) Portal Vein Occlusion/Thrombus: No Splenic Vein Occlusion/Thrombus: No Ascites: Trace perihepatic ascites Varices: None IMPRESSION: 1. The portal and hepatic veins are patent where visualized with antegrade flow. 2. Hepatomegaly and hepatic steatosis. Electronically Signed   By: Placido Sou M.D.   On: 12/27/2021 19:52   US Abdomen Limited RUQ (LIVER/GB)  Result Date: 12/27/2021 CLINICAL DATA:  Right upper quadrant pain EXAM: ULTRASOUND ABDOMEN LIMITED RIGHT UPPER QUADRANT COMPARISON:  MRCP 07/30/2021 and CT abdomen and pelvis 06/29/2021 FINDINGS: Gallbladder: Cholelithiasis measuring up to 7 mm in dimension. Negative sonographic Murphy's sign. No wall thickening or pericholecystic fluid. Common bile duct: Diameter: 2.9 mm Liver: Hepatic steatosis and  hepatomegaly. Portal vein is patent on color Doppler imaging with normal direction of blood flow towards the liver. Other: Trace perihepatic ascites. IMPRESSION: 1. Cholelithiasis without evidence of cholecystitis. 2. Hepatomegaly and hepatic steatosis. Electronically Signed   By: Placido Sou M.D.   On: 12/27/2021 19:45   DG Chest Port 1 View  Result Date: 12/27/2021 CLINICAL DATA:  Chronic cough and temporary weakness x1 day. EXAM: PORTABLE CHEST 1 VIEW COMPARISON:  July 29, 2021 FINDINGS: The heart size and mediastinal contours are within normal limits. Both lungs are clear. The visualized skeletal structures are unremarkable. IMPRESSION: No active disease. Electronically Signed   By: Virgina Norfolk M.D.   On: 12/27/2021 17:58      Subjective: Patient seen and examined at the bedside today.  Hemodynamically stable for discharge.  I called the son and discussed about the discharge planning  Discharge Exam: Vitals:   01/22/22 0634 01/22/22 0718  BP: (!) 149/80 121/68  Pulse: 79 63  Resp: 16   Temp:  98.5 F (36.9 C)  SpO2: 98% 100%   Vitals:   01/21/22 1706 01/21/22 2037 01/22/22 0634 01/22/22 0718  BP: (!) 142/94 (!) 147/81 (!) 149/80 121/68  Pulse: 72 65 79 63  Resp: 16 16 16    Temp: 98.3 F (36.8 C) 98 F (36.7 C)  98.5 F (36.9 C)  TempSrc: Oral Oral  Oral  SpO2: 100% 99% 98% 100%  Weight:      Height:        General: Pt is alert, awake, not in acute distress Cardiovascular: RRR, S1/S2 +, no rubs, no gallops Respiratory: CTA bilaterally, no wheezing, no rhonchi Abdominal: Soft, NT, ND, bowel sounds + Extremities: no edema, no cyanosis    The results of significant diagnostics from this hospitalization (including imaging, microbiology, ancillary and laboratory) are listed below for reference.     Microbiology: No results found for this or any previous visit (from the past 240 hour(s)).   Labs: BNP (last 3 results) Recent Labs    07/20/21 1849  07/29/21 2105  BNP 167.7* 242.3*   Basic Metabolic Panel: Recent Labs  Lab 01/16/22 0252 01/17/22 0500 01/18/22 1059 01/19/22 1135 01/20/22 0254 01/21/22 0328 01/22/22 0258  NA 144   < > 151*  150* 146* 144 140 140  K 4.9   < >  3.8  3.7 3.6 3.0* 4.4 3.5  CL 108   < > 116*  116* 112* 109 113* 110  CO2 23   < > 26  26 26 27  20* 25  GLUCOSE 107*   < > 121*  120* 121* 113* 93 97  BUN 89*   < > 49*  48* 30* 25* 15 11  CREATININE 1.28*   < > 0.92  0.85 0.78 0.59 0.53 0.55  CALCIUM 9.2   < > 8.9  8.9 8.7* 8.4* 8.1* 8.0*  MG 2.8*  --   --   --  1.2* 1.6*  --   PHOS 4.7*   < > 5.1* 4.5 4.2 4.1 3.9   < > = values in this interval not displayed.   Liver Function Tests: Recent Labs  Lab 01/16/22 0252 01/17/22 0500 01/18/22 1059 01/19/22 1135 01/20/22 0254 01/21/22 0328 01/22/22 0258  BILITOT 3.2* 2.8* 3.0*  --   --   --   --   ALBUMIN 2.4* 2.2* 2.3* 2.5* 2.2* 2.1* 2.0*   No results for input(s): "LIPASE", "AMYLASE" in the last 168 hours. No results for input(s): "AMMONIA" in the last 168 hours. CBC: Recent Labs  Lab 01/17/22 0500 01/18/22 1059 01/20/22 0254  WBC 11.2* 10.6* 12.3*  HGB 8.5* 9.3* 9.6*  HCT 27.8* 30.6* 31.7*  MCV 110.3* 110.1* 109.7*  PLT 122* 127* 134*   Cardiac Enzymes: No results for input(s): "CKTOTAL", "CKMB", "CKMBINDEX", "TROPONINI" in the last 168 hours. BNP: Invalid input(s): "POCBNP" CBG: Recent Labs  Lab 01/15/22 1126 01/19/22 2206  GLUCAP 113* 122*   D-Dimer No results for input(s): "DDIMER" in the last 72 hours. Hgb A1c No results for input(s): "HGBA1C" in the last 72 hours. Lipid Profile No results for input(s): "CHOL", "HDL", "LDLCALC", "TRIG", "CHOLHDL", "LDLDIRECT" in the last 72 hours. Thyroid function studies No results for input(s): "TSH", "T4TOTAL", "T3FREE", "THYROIDAB" in the last 72 hours.  Invalid input(s): "FREET3" Anemia work up No results for input(s): "VITAMINB12", "FOLATE", "FERRITIN", "TIBC", "IRON",  "RETICCTPCT" in the last 72 hours. Urinalysis    Component Value Date/Time   COLORURINE AMBER (A) 01/04/2022 0854   APPEARANCEUR HAZY (A) 01/04/2022 0854   LABSPEC 1.027 01/04/2022 0854   PHURINE 5.0 01/04/2022 0854   GLUCOSEU NEGATIVE 01/04/2022 0854   HGBUR MODERATE (A) 01/04/2022 0854   BILIRUBINUR SMALL (A) 01/04/2022 0854   KETONESUR NEGATIVE 01/04/2022 0854   PROTEINUR 100 (A) 01/04/2022 0854   UROBILINOGEN 1.0 03/19/2013 0448   NITRITE NEGATIVE 01/04/2022 0854   LEUKOCYTESUR TRACE (A) 01/04/2022 0854   Sepsis Labs Recent Labs  Lab 01/17/22 0500 01/18/22 1059 01/20/22 0254  WBC 11.2* 10.6* 12.3*   Microbiology No results found for this or any previous visit (from the past 240 hour(s)).  Please note: You were cared for by a hospitalist during your hospital stay. Once you are discharged, your primary care physician will handle any further medical issues. Please note that NO REFILLS for any discharge medications will be authorized once you are discharged, as it is imperative that you return to your primary care physician (or establish a relationship with a primary care physician if you do not have one) for your post hospital discharge needs so that they can reassess your need for medications and monitor your lab values.    Time coordinating discharge: 40 minutes  SIGNED:   03/22/22, MD  [facility] 01/22/2022, 10:45 AM Pager 03/24/2022  If 7PM-7AM, please contact night-coverage [email or web address] Password [email or web address]

## 2022-01-22 NOTE — TOC Progression Note (Addendum)
Transition of Care [facility]) - Progression Note    Patient Details  Name: Laura [name] MRN: [id number] Date of Birth: June 22, 1960  Transition of Care [facility]) CM/SW Contact  Joanne Chars, LCSW Phone Number: 01/22/2022, 11:06 AM  Clinical Narrative:   CSW spoke with son [name].  He is leaving shortly to go to [facility] and [place], will call as soon as he has seen them with facility choice.  1400: TC son, he would like to accept offer at [facility].  CSW informed Navi of facility choice, Josem Kaufmann approved: [id number], 3 days: 10/5-10/7.  TC [facility].  She does not have private room, she spoke with son and he is OK with this.  CSW spoke with pt and she is agreeable to have female roommate, Teena informed.    1445: [facility] called back.  Per her administrator, they need to know if pt has had any surgical changes with her female anatomy.  Pt is currently non ambulatory and will have to be changed in the room with the roommate.  Pt will need roommate that has same anatomy.  CSW spoke with pt in room, son [name] has now arrived.  This was discussed, pt reports she has not had any change to her female anatomy.  Pt fairly good natured about this, making jokes.  After the above was explained, pt is also agreeable to female roommate.  CSW informed [place] and pt will be assigned female roommate.     Barriers to Discharge: Continued Medical Work up  Expected Discharge Plan and Services     Discharge Planning Services: CM Consult   Living arrangements for the past 2 months: Apartment Expected Discharge Date: 01/22/22                                     Social Determinants of Health (SDOH) Interventions    Readmission Risk Interventions    07/02/2021   11:32 AM  Readmission Risk Prevention Plan  Post Dischage Appt Complete  Medication Screening Complete  Transportation Screening Complete

## 2022-01-23 DIAGNOSIS — D649 Anemia, unspecified: Secondary | ICD-10-CM | POA: Diagnosis not present

## 2022-01-23 DIAGNOSIS — E559 Vitamin D deficiency, unspecified: Secondary | ICD-10-CM | POA: Diagnosis not present

## 2022-01-23 DIAGNOSIS — I10 Essential (primary) hypertension: Secondary | ICD-10-CM | POA: Diagnosis not present

## 2022-01-23 DIAGNOSIS — L8990 Pressure ulcer of unspecified site, unspecified stage: Secondary | ICD-10-CM | POA: Diagnosis not present

## 2022-01-23 DIAGNOSIS — K219 Gastro-esophageal reflux disease without esophagitis: Secondary | ICD-10-CM | POA: Diagnosis not present

## 2022-01-23 DIAGNOSIS — F32A Depression, unspecified: Secondary | ICD-10-CM | POA: Diagnosis not present

## 2022-01-28 ENCOUNTER — Telehealth: Payer: Self-pay

## 2022-01-28 ENCOUNTER — Other Ambulatory Visit: Payer: Self-pay

## 2022-01-28 DIAGNOSIS — M6281 Muscle weakness (generalized): Secondary | ICD-10-CM | POA: Diagnosis not present

## 2022-01-28 DIAGNOSIS — R5381 Other malaise: Secondary | ICD-10-CM | POA: Diagnosis not present

## 2022-01-28 DIAGNOSIS — L89313 Pressure ulcer of right buttock, stage 3: Secondary | ICD-10-CM | POA: Diagnosis not present

## 2022-01-28 DIAGNOSIS — L89320 Pressure ulcer of left buttock, unstageable: Secondary | ICD-10-CM | POA: Diagnosis not present

## 2022-01-28 DIAGNOSIS — L8990 Pressure ulcer of unspecified site, unspecified stage: Secondary | ICD-10-CM | POA: Diagnosis not present

## 2022-01-28 NOTE — Telephone Encounter (Signed)
Patient called and is wanting a medication refill for oxycodone for her lower back pain. I looked at her chart and did not see it on her medication list. I told patient I would reach out to PCP for further advise as it has not been ordered through [place] before. Sending to PCP

## 2022-01-29 NOTE — Telephone Encounter (Signed)
She will need hospital follow up. It looks like transferred her to SNF after hospitalization

## 2022-01-30 ENCOUNTER — Telehealth: Payer: Self-pay

## 2022-01-30 NOTE — Telephone Encounter (Signed)
Patient called requesting a refill on percocet and is currently in the hospital. Patient was advised to call the office back when she is discharged from the hospital for any medication refills.

## 2022-02-02 DIAGNOSIS — L8990 Pressure ulcer of unspecified site, unspecified stage: Secondary | ICD-10-CM | POA: Diagnosis not present

## 2022-02-02 DIAGNOSIS — R5381 Other malaise: Secondary | ICD-10-CM | POA: Diagnosis not present

## 2022-02-02 DIAGNOSIS — D649 Anemia, unspecified: Secondary | ICD-10-CM | POA: Diagnosis not present

## 2022-02-04 DIAGNOSIS — M6281 Muscle weakness (generalized): Secondary | ICD-10-CM | POA: Diagnosis not present

## 2022-02-04 DIAGNOSIS — L89320 Pressure ulcer of left buttock, unstageable: Secondary | ICD-10-CM | POA: Diagnosis not present

## 2022-02-04 DIAGNOSIS — L89313 Pressure ulcer of right buttock, stage 3: Secondary | ICD-10-CM | POA: Diagnosis not present

## 2022-02-04 DIAGNOSIS — F32A Depression, unspecified: Secondary | ICD-10-CM | POA: Diagnosis not present

## 2022-02-04 DIAGNOSIS — I10 Essential (primary) hypertension: Secondary | ICD-10-CM | POA: Diagnosis not present

## 2022-02-05 DIAGNOSIS — M7120 Synovial cyst of popliteal space [Baker], unspecified knee: Secondary | ICD-10-CM | POA: Diagnosis not present

## 2022-02-05 DIAGNOSIS — F32A Depression, unspecified: Secondary | ICD-10-CM | POA: Diagnosis not present

## 2022-02-05 DIAGNOSIS — K219 Gastro-esophageal reflux disease without esophagitis: Secondary | ICD-10-CM | POA: Diagnosis not present

## 2022-02-05 DIAGNOSIS — L8990 Pressure ulcer of unspecified site, unspecified stage: Secondary | ICD-10-CM | POA: Diagnosis not present

## 2022-02-05 DIAGNOSIS — I10 Essential (primary) hypertension: Secondary | ICD-10-CM | POA: Diagnosis not present

## 2022-02-06 DIAGNOSIS — G890 Central pain syndrome: Secondary | ICD-10-CM | POA: Diagnosis not present

## 2022-02-09 DIAGNOSIS — I10 Essential (primary) hypertension: Secondary | ICD-10-CM | POA: Diagnosis not present

## 2022-02-09 DIAGNOSIS — F32A Depression, unspecified: Secondary | ICD-10-CM | POA: Diagnosis not present

## 2022-02-09 DIAGNOSIS — M7120 Synovial cyst of popliteal space [Baker], unspecified knee: Secondary | ICD-10-CM | POA: Diagnosis not present

## 2022-02-18 DIAGNOSIS — M6281 Muscle weakness (generalized): Secondary | ICD-10-CM | POA: Diagnosis not present

## 2022-02-18 DIAGNOSIS — L89313 Pressure ulcer of right buttock, stage 3: Secondary | ICD-10-CM | POA: Diagnosis not present

## 2022-02-18 DIAGNOSIS — L89320 Pressure ulcer of left buttock, unstageable: Secondary | ICD-10-CM | POA: Diagnosis not present

## 2022-02-19 DIAGNOSIS — L8990 Pressure ulcer of unspecified site, unspecified stage: Secondary | ICD-10-CM | POA: Diagnosis not present

## 2022-02-19 DIAGNOSIS — F32A Depression, unspecified: Secondary | ICD-10-CM | POA: Diagnosis not present

## 2022-02-19 DIAGNOSIS — I10 Essential (primary) hypertension: Secondary | ICD-10-CM | POA: Diagnosis not present

## 2022-02-23 ENCOUNTER — Telehealth: Payer: Self-pay

## 2022-02-23 NOTE — Telephone Encounter (Signed)
Patient called regarding his pain medication. I informed patient that pain medication has not been refilled since he is in rehab at [facility]. Patient states that he is in a lot of pain.  Message routed to [name], NP

## 2022-02-23 NOTE — Telephone Encounter (Signed)
Will need follow up visit

## 2022-02-26 NOTE — Telephone Encounter (Signed)
Spoke with patient and informed him that he needs follow up appointment. He verbalized his understanding

## 2022-02-28 DIAGNOSIS — I10 Essential (primary) hypertension: Secondary | ICD-10-CM | POA: Diagnosis not present

## 2022-02-28 DIAGNOSIS — R262 Difficulty in walking, not elsewhere classified: Secondary | ICD-10-CM | POA: Diagnosis not present

## 2022-02-28 DIAGNOSIS — M6281 Muscle weakness (generalized): Secondary | ICD-10-CM | POA: Diagnosis not present

## 2022-03-06 ENCOUNTER — Ambulatory Visit (INDEPENDENT_AMBULATORY_CARE_PROVIDER_SITE_OTHER): Payer: Medicare Other | Admitting: Nurse Practitioner

## 2022-03-06 ENCOUNTER — Encounter: Payer: Self-pay | Admitting: Nurse Practitioner

## 2022-03-06 VITALS — BP 138/76 | HR 94 | Temp 98.2°F | Resp 18 | Ht 67.0 in

## 2022-03-06 DIAGNOSIS — I471 Supraventricular tachycardia, unspecified: Secondary | ICD-10-CM

## 2022-03-06 DIAGNOSIS — Z23 Encounter for immunization: Secondary | ICD-10-CM

## 2022-03-06 DIAGNOSIS — M5441 Lumbago with sciatica, right side: Secondary | ICD-10-CM

## 2022-03-06 DIAGNOSIS — D539 Nutritional anemia, unspecified: Secondary | ICD-10-CM | POA: Diagnosis not present

## 2022-03-06 DIAGNOSIS — M5442 Lumbago with sciatica, left side: Secondary | ICD-10-CM | POA: Diagnosis not present

## 2022-03-06 DIAGNOSIS — E559 Vitamin D deficiency, unspecified: Secondary | ICD-10-CM | POA: Diagnosis not present

## 2022-03-06 DIAGNOSIS — L89323 Pressure ulcer of left buttock, stage 3: Secondary | ICD-10-CM

## 2022-03-06 DIAGNOSIS — K760 Fatty (change of) liver, not elsewhere classified: Secondary | ICD-10-CM | POA: Diagnosis not present

## 2022-03-06 DIAGNOSIS — E538 Deficiency of other specified B group vitamins: Secondary | ICD-10-CM | POA: Diagnosis not present

## 2022-03-06 DIAGNOSIS — G8929 Other chronic pain: Secondary | ICD-10-CM

## 2022-03-06 MED ORDER — OXYCODONE-ACETAMINOPHEN 5-325 MG PO TABS: 1.0000 | ORAL_TABLET | Freq: Every day | ORAL | 0 refills | Status: AC

## 2022-03-06 NOTE — Patient Instructions (Signed)
Make sure you are taking your folic acid, vit b12 and iron supplement To restart cymbalta for anxiety

## 2022-03-06 NOTE — Progress Notes (Unsigned)
Careteam: Patient Care Team: Sharon Seller, NP as PCP - General (Geriatric Medicine) Pricilla Riffle, MD as PCP - Cardiology (Cardiology)  PLACE OF SERVICE:  [facility]  Advanced Directive information Does Patient Have a Medical Advance Directive?: No, Would patient like information on creating a medical advance directive?: No - Patient declined  Allergies  Allergen Reactions   Garlic Anaphylaxis   Aspirin Hives   Lyrica [Pregabalin]     Nightmares, did not help neuropathy   Penicillins Hives    Tolerated ceftriaxone 12/2021    Chief Complaint  Patient presents with   Hospitalization Follow-up    Follow-up from hospital stay 12/27/2021-01/22/2022     HPI: Patient is a 61 y.o. adult for follow up from hospitalization.  He was hospitalized from 9/9-10/5 then went to rehab for ~1 month, went home 6 days ago and has been doing better since he has been home.  Doing a lot better at home Still weak and in wheelchair.  Reports medication has changed.   GERD- on protonix, no GERD  He was having diarrhea at the facility and now that he is home it has stopped- he is not taking the rifaximin (never got filled)  Not taking lasix- leg swelling better.   Htn- taking metoprolol 1/2 tablet twice daily, no palpitations or fast heartbeats.   Chronic back pain- not on Cymbalta, was not on any pain medication at facility. Reports he was taking his oxycodone that was prescribed here at the hospital because they were not giving him anything for pain. He was out before he got out of the hospital. Now in severe pain, using ibuprofen but not helping.   Reports they told him she had a wound but has not seen or done any wound care.   Low back pain- reports   Wound- 2x1  Review of Systems:  ROS  Past Medical History:  Diagnosis Date   Acute bronchitis    Cervical high risk HPV (human papillomavirus) test positive 12/2017   Negative subtype 16, 18/45   Chronic back pain     Complication of anesthesia    "didn't wake up"   Cough    Insomnia, unspecified    Other malaise and fatigue    Other specified diseases of blood and blood-forming organs(289.89)    Tobacco use disorder    Past Surgical History:  Procedure Laterality Date   BIOPSY  07/01/2021   Procedure: BIOPSY;  Surgeon: Jeani Hawking, MD;  Location: [facility] ENDOSCOPY;  Service: Gastroenterology;;  EGD and COLON   COLONOSCOPY WITH PROPOFOL N/A 07/01/2021   Procedure: COLONOSCOPY WITH PROPOFOL;  Surgeon: Jeani Hawking, MD;  Location: [facility] ENDOSCOPY;  Service: Gastroenterology;  Laterality: N/A;   ESOPHAGOGASTRODUODENOSCOPY N/A 03/17/2013   Procedure: ESOPHAGOGASTRODUODENOSCOPY (EGD);  Surgeon: Meryl Dare, MD;  Location: [facility] ENDOSCOPY;  Service: Endoscopy;  Laterality: N/A;   ESOPHAGOGASTRODUODENOSCOPY (EGD) WITH PROPOFOL N/A 07/01/2021   Procedure: ESOPHAGOGASTRODUODENOSCOPY (EGD) WITH PROPOFOL;  Surgeon: Jeani Hawking, MD;  Location: [facility] ENDOSCOPY;  Service: Gastroenterology;  Laterality: N/A;   FRACTURE SURGERY     Laminectomy and foraminotomy at L5-S1    12/12/2019   TONSILLECTOMY     Social History:   reports that he has been smoking cigarettes. He has a 6.00 pack-year smoking history. He has never used smokeless tobacco. He reports that he does not currently use alcohol. He reports that he does not use drugs.  Family History  Problem Relation Age of Onset   Arthritis Mother  Hypertension Sister    Hypertension Brother    Hypertension Sister    Hypertension Sister    Hypertension Sister     Medications: Patient's Medications  New Prescriptions   No medications on file  Previous Medications   CHOLECALCIFEROL (CHOLECALCIFEROL) 25 MCG TABLET    Take 2 tablets (2,000 Units total) by mouth daily.   DULOXETINE (CYMBALTA) 30 MG CAPSULE    TAKE 1 CAPSULE(30 MG) BY MOUTH DAILY   FEEDING SUPPLEMENT (ENSURE ENLIVE / ENSURE PLUS) LIQD    Take 237 mLs by mouth 2 (two) times daily between meals.    FERROUS SULFATE 325 (65 FE) MG TABLET    Take 325 mg by mouth 2 (two) times daily with a meal.   FOLIC ACID (FOLVITE) 1 MG TABLET    Take 1 tablet (1 mg total) by mouth daily.   FUROSEMIDE (LASIX) 20 MG TABLET    TAKE 1 TABLET(20 MG) BY MOUTH DAILY   MAGNESIUM OXIDE (MAG-OX) 400 MG TABLET    Take 1 tablet (400 mg total) by mouth 2 (two) times daily.   METOPROLOL TARTRATE (LOPRESSOR) 25 MG TABLET    TAKE 1/2 TABLET(12.5 MG) BY MOUTH TWICE DAILY   NYSTATIN POWDER    Apply 1 Application topically 3 (three) times daily.   PANTOPRAZOLE (PROTONIX) 40 MG TABLET    TAKE 1 TABLET BY MOUTH DAILY   POTASSIUM CHLORIDE SA (KLOR-CON M) 20 MEQ TABLET    Take 1 tablet (20 mEq total) by mouth daily for 7 days.   RIFAXIMIN (XIFAXAN) 550 MG TABS TABLET    Take 1 tablet (550 mg total) by mouth 3 (three) times daily.   TETRAHYDROZOLINE HCL (VISINE OP)    Place 1 drop into both eyes daily as needed (For dry eyes).   THIAMINE (VITAMIN B-1) 100 MG TABLET    Take 1 tablet (100 mg total) by mouth daily.  Modified Medications   No medications on file  Discontinued Medications   No medications on file    Physical Exam:  Vitals:   03/06/22 1005  BP: 138/76  Pulse: 94  Resp: 18  Temp: 98.2 F (36.8 C)  SpO2: 96%  Height: 5\' 7"  (1.702 m)   Body mass index is 29.42 kg/m. Wt Readings from Last 3 Encounters:  01/21/22 187 lb 13.3 oz (85.2 kg)  09/24/21 154 lb 3.2 oz (69.9 kg)  08/04/21 169 lb 9.6 oz (76.9 kg)    Physical Exam***  Labs reviewed: Basic Metabolic Panel: Recent Labs    06/30/21 0435 06/30/21 0752 12/27/21 1653 12/27/21 1751 01/16/22 0252 01/17/22 0500 01/20/22 0254 01/21/22 0328 01/22/22 0258  NA 136   < >  --    < > 144   < > 144 140 140  K 2.7*   < >  --    < > 4.9   < > 3.0* 4.4 3.5  CL 100   < >  --    < > 108   < > 109 113* 110  CO2 28   < >  --    < > 23   < > 27 20* 25  GLUCOSE 104*   < >  --    < > 107*   < > 113* 93 97  BUN <5*   < >  --    < > 89*   < > 25* 15 11   CREATININE 0.51   < >  --    < > 1.28*   < >  0.59 0.53 0.55  CALCIUM 7.2*   < >  --    < > 9.2   < > 8.4* 8.1* 8.0*  MG  --    < >  --    < > 2.8*  --  1.2* 1.6*  --   PHOS  --    < >  --    < > 4.7*   < > 4.2 4.1 3.9  TSH 2.961  --  2.871  --   --   --   --   --   --    < > = values in this interval not displayed.   Liver Function Tests: Recent Labs    01/08/22 0531 01/09/22 0320 01/10/22 0256 01/13/22 0312 01/14/22 0344 01/16/22 0252 01/17/22 0500 01/18/22 1059 01/19/22 1135 01/20/22 0254 01/21/22 0328 01/22/22 0258  AST 31 41  --  75*  --   --   --   --   --   --   --   --   ALT 20 24  --  50*  --   --   --   --   --   --   --   --   ALKPHOS 111 139*  --  249*  --   --   --   --   --   --   --   --   BILITOT 5.3* 4.8*   < > 3.8*   < > 3.2* 2.8* 3.0*  --   --   --   --   PROT 5.2* 5.3*  --  5.6*  --   --   --   --   --   --   --   --   ALBUMIN 2.2* 2.3*   < > 2.1*   < > 2.4* 2.2* 2.3*   < > 2.2* 2.1* 2.0*   < > = values in this interval not displayed.   No results for input(s): "LIPASE", "AMYLASE" in the last 8760 hours. Recent Labs    12/27/21 1651 12/28/21 1039 12/31/21 0932  AMMONIA 60* 58* 45*   CBC: Recent Labs    12/27/21 1650 12/27/21 1751 12/30/21 0310 12/30/21 1032 01/05/22 0507 01/06/22 0540 01/17/22 0500 01/18/22 1059 01/20/22 0254  WBC 5.5   < > 8.4  8.4   < > 26.9*  26.4*   < > 11.2* 10.6* 12.3*  NEUTROABS 4.9  --  6.7  --  23.8*  --   --   --   --   HGB 5.0*   < > 8.6*  8.5*   < > 7.5*  7.6*   < > 8.5* 9.3* 9.6*  HCT 15.5*   < > 24.5*  24.4*   < > 21.7*  21.6*   < > 27.8* 30.6* 31.7*  MCV 140.9*   < > 95.7  95.7   < > 95.2  94.3   < > 110.3* 110.1* 109.7*  PLT 77*   < > 22*  23*   < > 54*  56*  54*   < > 122* 127* 134*   < > = values in this interval not displayed.   Lipid Panel: No results for input(s): "CHOL", "HDL", "LDLCALC", "TRIG", "CHOLHDL", "LDLDIRECT" in the last 8760 hours. TSH: Recent Labs    06/30/21 0435  12/27/21 1653  TSH 2.961 2.871   A1C: Lab Results  Component Value Date   HGBA1C 4.8 07/01/2015     Assessment/Plan There are  no diagnoses linked to this encounter.  No follow-ups on file.: *** Juanito Gonyer K. Biagio Borg  [facility] [phone]

## 2022-03-07 LAB — COMPLETE METABOLIC PANEL WITH GFR
AG Ratio: 0.6 (calc) — ABNORMAL LOW (ref 1.0–2.5)
ALT: 7 U/L (ref 6–29)
AST: 12 U/L (ref 10–35)
Albumin: 2.7 g/dL — ABNORMAL LOW (ref 3.6–5.1)
Alkaline phosphatase (APISO): 133 U/L (ref 37–153)
BUN/Creatinine Ratio: 27 (calc) — ABNORMAL HIGH (ref 6–22)
BUN: 8 mg/dL (ref 7–25)
CO2: 28 mmol/L (ref 20–32)
Calcium: 8.4 mg/dL — ABNORMAL LOW (ref 8.6–10.4)
Chloride: 103 mmol/L (ref 98–110)
Creat: 0.3 mg/dL — ABNORMAL LOW (ref 0.50–1.03)
Globulin: 4.4 g/dL (calc) — ABNORMAL HIGH (ref 1.9–3.7)
Glucose, Bld: 77 mg/dL (ref 65–99)
Potassium: 3.9 mmol/L (ref 3.5–5.3)
Sodium: 138 mmol/L (ref 135–146)
Total Bilirubin: 1.2 mg/dL (ref 0.2–1.2)
Total Protein: 7.1 g/dL (ref 6.1–8.1)
eGFR: 122 mL/min/{1.73_m2} (ref >=60)

## 2022-03-07 LAB — CBC WITH DIFFERENTIAL/PLATELET
Absolute Monocytes: 496 cells/uL (ref 200–950)
Basophils Absolute: 92 cells/uL (ref 0–200)
Basophils Relative: 1.1 %
Eosinophils Absolute: 428 cells/uL (ref 15–500)
Eosinophils Relative: 5.1 %
HCT: 38.4 % (ref 35.0–45.0)
Hemoglobin: 12.6 g/dL (ref 11.7–15.5)
Lymphs Abs: 1058 cells/uL (ref 850–3900)
MCH: 30.4 pg (ref 27.0–33.0)
MCHC: 32.8 g/dL (ref 32.0–36.0)
MCV: 92.8 fL (ref 80.0–100.0)
MPV: 11.3 fL (ref 7.5–12.5)
Monocytes Relative: 5.9 %
Neutro Abs: 6325 cells/uL (ref 1500–7800)
Neutrophils Relative %: 75.3 %
Platelets: 337 10*3/uL (ref 140–400)
RBC: 4.14 10*6/uL (ref 3.80–5.10)
RDW: 14.2 % (ref 11.0–15.0)
Total Lymphocyte: 12.6 %
WBC: 8.4 10*3/uL (ref 3.8–10.8)

## 2022-03-09 ENCOUNTER — Other Ambulatory Visit: Payer: Self-pay

## 2022-03-09 DIAGNOSIS — G629 Polyneuropathy, unspecified: Secondary | ICD-10-CM

## 2022-03-09 MED ORDER — FOLIC ACID 1 MG PO TABS: 1.0000 mg | ORAL_TABLET | Freq: Every day | ORAL | 3 refills | Status: DC

## 2022-03-09 MED ORDER — FERROUS SULFATE 325 (65 FE) MG PO TABS: 325.0000 mg | ORAL_TABLET | Freq: Two times a day (BID) | ORAL | 3 refills | Status: DC

## 2022-03-09 MED ORDER — DULOXETINE HCL 30 MG PO CPEP: ORAL_CAPSULE | ORAL | 3 refills | Status: DC

## 2022-04-06 ENCOUNTER — Other Ambulatory Visit: Payer: Self-pay

## 2022-04-06 DIAGNOSIS — G8929 Other chronic pain: Secondary | ICD-10-CM

## 2022-04-06 MED ORDER — OXYCODONE-ACETAMINOPHEN 5-325 MG PO TABS: 1.0000 | ORAL_TABLET | Freq: Every day | ORAL | 0 refills | Status: DC

## 2022-04-06 NOTE — Telephone Encounter (Signed)
Patient called for a refill on Oxycodone- acetaminophen.

## 2022-04-20 DIAGNOSIS — R262 Difficulty in walking, not elsewhere classified: Secondary | ICD-10-CM | POA: Diagnosis not present

## 2022-04-20 DIAGNOSIS — I10 Essential (primary) hypertension: Secondary | ICD-10-CM | POA: Diagnosis not present

## 2022-04-20 DIAGNOSIS — M6281 Muscle weakness (generalized): Secondary | ICD-10-CM | POA: Diagnosis not present

## 2022-04-22 ENCOUNTER — Ambulatory Visit: Payer: Medicare Other | Admitting: Nurse Practitioner

## 2022-04-27 ENCOUNTER — Encounter: Payer: Medicare Other | Admitting: Nurse Practitioner

## 2022-04-29 NOTE — Progress Notes (Signed)
This encounter was created in error - please disregard.

## 2022-05-01 ENCOUNTER — Other Ambulatory Visit: Payer: Self-pay

## 2022-05-01 DIAGNOSIS — G629 Polyneuropathy, unspecified: Secondary | ICD-10-CM

## 2022-05-01 MED ORDER — DULOXETINE HCL 30 MG PO CPEP: ORAL_CAPSULE | ORAL | 3 refills | Status: DC

## 2022-05-04 ENCOUNTER — Telehealth: Payer: Self-pay

## 2022-05-04 DIAGNOSIS — I10 Essential (primary) hypertension: Secondary | ICD-10-CM

## 2022-05-04 DIAGNOSIS — G629 Polyneuropathy, unspecified: Secondary | ICD-10-CM

## 2022-05-04 MED ORDER — FUROSEMIDE 20 MG PO TABS: ORAL_TABLET | ORAL | 3 refills | Status: DC

## 2022-05-04 MED ORDER — METOPROLOL TARTRATE 25 MG PO TABS: ORAL_TABLET | ORAL | 3 refills | Status: DC

## 2022-05-04 MED ORDER — DULOXETINE HCL 30 MG PO CPEP: ORAL_CAPSULE | ORAL | 3 refills | Status: DC

## 2022-05-04 MED ORDER — PANTOPRAZOLE SODIUM 40 MG PO TBEC: 40.0000 mg | DELAYED_RELEASE_TABLET | Freq: Every day | ORAL | 3 refills | Status: DC

## 2022-05-04 NOTE — Telephone Encounter (Signed)
Outgoing call placed to patient to confirm that she is using [facility], confirmed. RX's sent as requested

## 2022-05-06 ENCOUNTER — Other Ambulatory Visit: Payer: Self-pay

## 2022-05-06 DIAGNOSIS — G8929 Other chronic pain: Secondary | ICD-10-CM

## 2022-05-06 MED ORDER — OXYCODONE-ACETAMINOPHEN 5-325 MG PO TABS: 1.0000 | ORAL_TABLET | Freq: Every day | ORAL | 0 refills | Status: DC

## 2022-05-06 NOTE — Telephone Encounter (Signed)
Patient called requesting refill on Oxycodone 5/325mg  tablet. Take one tablet daily. Medication last refilled 04/06/2022.Last pain agreement 01/08/2021.  Medication pended and sent to [name], NP for approval/refusal

## 2022-05-15 ENCOUNTER — Telehealth: Payer: Self-pay

## 2022-05-15 NOTE — Telephone Encounter (Signed)
Kennyth Lose with [facility] called requesting refill on potassium for patient. Looking at medication list potassium was only prescribed for a short period of time. Is patient to continue taking potassium.  I tried contacting  patient,on both contact numbers on file, to see if he is still taking medication,but unable to reach patient and unable to leave a message.  Message routed to Marlowe Sax, NP

## 2022-05-15 NOTE — Telephone Encounter (Signed)
Per Dani Gobble last visit note patient was not taking Lasix and edema was better so if still not taking lasix then does not need Potassium.Please verify with patient if taking Lasix.

## 2022-05-28 ENCOUNTER — Encounter: Payer: Self-pay | Admitting: Nurse Practitioner

## 2022-05-29 ENCOUNTER — Encounter: Payer: 59 | Admitting: Nurse Practitioner

## 2022-05-31 DIAGNOSIS — I10 Essential (primary) hypertension: Secondary | ICD-10-CM | POA: Diagnosis not present

## 2022-05-31 DIAGNOSIS — M6281 Muscle weakness (generalized): Secondary | ICD-10-CM | POA: Diagnosis not present

## 2022-05-31 DIAGNOSIS — R262 Difficulty in walking, not elsewhere classified: Secondary | ICD-10-CM | POA: Diagnosis not present

## 2022-06-01 NOTE — Progress Notes (Signed)
This encounter was created in error - please disregard.

## 2022-06-05 ENCOUNTER — Other Ambulatory Visit: Payer: Self-pay

## 2022-06-05 DIAGNOSIS — G8929 Other chronic pain: Secondary | ICD-10-CM

## 2022-06-05 MED ORDER — OXYCODONE-ACETAMINOPHEN 5-325 MG PO TABS: 1.0000 | ORAL_TABLET | Freq: Every day | ORAL | 0 refills | Status: DC

## 2022-06-05 NOTE — Telephone Encounter (Signed)
Patient called and request refill on medication Oxycodone. Patient medication last refilled 05/06/2022. Patient has Opioid Contract on file dated 01/03/2021. Patient has upcoming appointment 10/30/2022. Update Contract added to patient appointment note. Medication pend and sent to PCP Dewaine Oats [employer], NP for approval.

## 2022-06-29 DIAGNOSIS — I10 Essential (primary) hypertension: Secondary | ICD-10-CM | POA: Diagnosis not present

## 2022-06-29 DIAGNOSIS — M6281 Muscle weakness (generalized): Secondary | ICD-10-CM | POA: Diagnosis not present

## 2022-06-29 DIAGNOSIS — R262 Difficulty in walking, not elsewhere classified: Secondary | ICD-10-CM | POA: Diagnosis not present

## 2022-07-03 ENCOUNTER — Other Ambulatory Visit: Payer: Self-pay

## 2022-07-03 DIAGNOSIS — G8929 Other chronic pain: Secondary | ICD-10-CM

## 2022-07-03 MED ORDER — OXYCODONE-ACETAMINOPHEN 5-325 MG PO TABS: 1.0000 | ORAL_TABLET | Freq: Every day | ORAL | 0 refills | Status: DC

## 2022-07-03 NOTE — Telephone Encounter (Signed)
Patient called requesting refill on Oxycodone. Medication was last filled 06/05/2022. Patient last treatment agreement dated 01/03/2021.  Medication pended and sent to [name], NP to approve if appropriate.

## 2022-07-30 DIAGNOSIS — R262 Difficulty in walking, not elsewhere classified: Secondary | ICD-10-CM | POA: Diagnosis not present

## 2022-07-30 DIAGNOSIS — I10 Essential (primary) hypertension: Secondary | ICD-10-CM | POA: Diagnosis not present

## 2022-07-30 DIAGNOSIS — M6281 Muscle weakness (generalized): Secondary | ICD-10-CM | POA: Diagnosis not present

## 2022-08-05 ENCOUNTER — Telehealth: Payer: Self-pay

## 2022-08-05 DIAGNOSIS — G8929 Other chronic pain: Secondary | ICD-10-CM

## 2022-08-05 MED ORDER — OXYCODONE-ACETAMINOPHEN 5-325 MG PO TABS: 1.0000 | ORAL_TABLET | Freq: Every day | ORAL | 0 refills | Status: DC

## 2022-08-05 NOTE — Telephone Encounter (Signed)
Patient is requesting a refill of the following medications: Requested Prescriptions   Pending Prescriptions Disp Refills   oxyCODONE-acetaminophen (PERCOCET/ROXICET) 5-325 MG tablet 30 tablet 0    Sig: Take 1 tablet by mouth daily.    Date of last refill: 07/03/2022  Refill amount: 30/0  Treatment agreement date: outdated, notation already made on pending appointment for July 2024

## 2022-08-05 NOTE — Telephone Encounter (Signed)
Rx sent 

## 2022-08-29 DIAGNOSIS — I10 Essential (primary) hypertension: Secondary | ICD-10-CM | POA: Diagnosis not present

## 2022-08-29 DIAGNOSIS — M6281 Muscle weakness (generalized): Secondary | ICD-10-CM | POA: Diagnosis not present

## 2022-08-29 DIAGNOSIS — R262 Difficulty in walking, not elsewhere classified: Secondary | ICD-10-CM | POA: Diagnosis not present

## 2022-09-04 ENCOUNTER — Telehealth: Payer: 59

## 2022-09-04 DIAGNOSIS — G8929 Other chronic pain: Secondary | ICD-10-CM

## 2022-09-04 MED ORDER — OXYCODONE-ACETAMINOPHEN 5-325 MG PO TABS: 1.0000 | ORAL_TABLET | Freq: Every day | ORAL | 0 refills | Status: DC

## 2022-09-04 NOTE — Telephone Encounter (Signed)
Patient is requesting a refill of the following medications: Requested Prescriptions   Pending Prescriptions Disp Refills   oxyCODONE-acetaminophen (PERCOCET/ROXICET) 5-325 MG tablet 30 tablet 0    Sig: Take 1 tablet by mouth daily.    Date of last refill: 08/05/2022  Refill amount: 30  Treatment agreement date: Notation already made on pending appointment for July 2024

## 2022-09-04 NOTE — Telephone Encounter (Signed)
Oxycodone -APAP refilled.

## 2022-09-29 DIAGNOSIS — R262 Difficulty in walking, not elsewhere classified: Secondary | ICD-10-CM | POA: Diagnosis not present

## 2022-09-29 DIAGNOSIS — M6281 Muscle weakness (generalized): Secondary | ICD-10-CM | POA: Diagnosis not present

## 2022-09-29 DIAGNOSIS — I10 Essential (primary) hypertension: Secondary | ICD-10-CM | POA: Diagnosis not present

## 2022-10-05 ENCOUNTER — Other Ambulatory Visit: Payer: Self-pay

## 2022-10-05 DIAGNOSIS — G8929 Other chronic pain: Secondary | ICD-10-CM

## 2022-10-05 MED ORDER — OXYCODONE-ACETAMINOPHEN 5-325 MG PO TABS: 1.0000 | ORAL_TABLET | Freq: Every day | ORAL | 0 refills | Status: DC

## 2022-10-05 NOTE — Telephone Encounter (Signed)
Patient called requesting refill on Oxycodone. Medication last refilled 09/04/22. Treatment agreement on file dated 01/03/2021.  Medication pended and sent to Abbey Chatters, NP

## 2022-10-29 DIAGNOSIS — I10 Essential (primary) hypertension: Secondary | ICD-10-CM | POA: Diagnosis not present

## 2022-10-29 DIAGNOSIS — R262 Difficulty in walking, not elsewhere classified: Secondary | ICD-10-CM | POA: Diagnosis not present

## 2022-10-29 DIAGNOSIS — M6281 Muscle weakness (generalized): Secondary | ICD-10-CM | POA: Diagnosis not present

## 2022-10-30 ENCOUNTER — Encounter: Payer: 59 | Admitting: Nurse Practitioner

## 2022-10-30 ENCOUNTER — Encounter: Payer: Self-pay | Admitting: Nurse Practitioner

## 2022-10-30 NOTE — Progress Notes (Signed)
err

## 2022-11-04 ENCOUNTER — Other Ambulatory Visit: Payer: Self-pay

## 2022-11-04 DIAGNOSIS — G8929 Other chronic pain: Secondary | ICD-10-CM

## 2022-11-04 MED ORDER — OXYCODONE-ACETAMINOPHEN 5-325 MG PO TABS: 1.0000 | ORAL_TABLET | Freq: Every day | ORAL | 0 refills | Status: DC

## 2022-11-04 NOTE — Telephone Encounter (Signed)
Patient is requesting a refill of the following medications: Requested Prescriptions   Pending Prescriptions Disp Refills   oxyCODONE-acetaminophen (PERCOCET/ROXICET) 5-325 MG tablet 30 tablet 0    Sig: Take 1 tablet by mouth daily.    Date of last refill: 10/05/22  Refill amount:  30  Treatment agreement date: outdated, notation made on pending appointment, 11/10/22

## 2022-11-10 ENCOUNTER — Telehealth (INDEPENDENT_AMBULATORY_CARE_PROVIDER_SITE_OTHER): Payer: 59 | Admitting: Nurse Practitioner

## 2022-11-10 ENCOUNTER — Telehealth: Payer: Self-pay | Admitting: *Deleted

## 2022-11-10 DIAGNOSIS — Z0000 Encounter for general adult medical examination without abnormal findings: Secondary | ICD-10-CM

## 2022-11-10 DIAGNOSIS — Z1231 Encounter for screening mammogram for malignant neoplasm of breast: Secondary | ICD-10-CM | POA: Diagnosis not present

## 2022-11-10 DIAGNOSIS — E2839 Other primary ovarian failure: Secondary | ICD-10-CM

## 2022-11-10 DIAGNOSIS — F17200 Nicotine dependence, unspecified, uncomplicated: Secondary | ICD-10-CM | POA: Diagnosis not present

## 2022-11-10 NOTE — Progress Notes (Signed)
   This service is provided via telemedicine  No vital signs collected/recorded due to the encounter was a telemedicine visit.   Location of patient (ex: home, work):  Home  Patient consents to a telephone visit:  Yes  Location of the provider (ex: office, home):  Office [place].   Name of any referring provider:  na  Names of all persons participating in the telemedicine service and their role in the encounter:  Brand Males, Patient, Nelda Severe, CMA, Abbey Chatters, NP  Time spent on call:  8:14
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
## 2022-11-10 NOTE — Telephone Encounter (Signed)
Mr. latera, mclin are scheduled for a virtual visit with your provider today.    Just as we do with appointments in the office, we must obtain your consent to participate.  Your consent will be active for this visit and any virtual visit you Dior Stepter have with one of our providers in the next 365 days.    If you have a MyChart account, I can also send a copy of this consent to you electronically.  All virtual visits are billed to your insurance company just like a traditional visit in the office.  As this is a virtual visit, video technology does not allow for your provider to perform a traditional examination.  This Yaasir Menken limit your provider's ability to fully assess your condition.  If your provider identifies any concerns that need to be evaluated in person or the need to arrange testing such as labs, EKG, etc, we will make arrangements to do so.    Although advances in technology are sophisticated, we cannot ensure that it will always work on either your end or our end.  If the connection with a video visit is poor, we Tristian Sickinger have to switch to a telephone visit.  With either a video or telephone visit, we are not always able to ensure that we have a secure connection.   I need to obtain your verbal consent now.   Are you willing to proceed with your visit today?   TAELYR JANTZ has provided verbal consent on 11/10/2022 for a virtual visit (video or telephone).   MayBeckey Downing, [place] 11/10/2022  11:15 AM

## 2022-11-29 DIAGNOSIS — M6281 Muscle weakness (generalized): Secondary | ICD-10-CM | POA: Diagnosis not present

## 2022-11-29 DIAGNOSIS — I10 Essential (primary) hypertension: Secondary | ICD-10-CM | POA: Diagnosis not present

## 2022-11-29 DIAGNOSIS — R262 Difficulty in walking, not elsewhere classified: Secondary | ICD-10-CM | POA: Diagnosis not present

## 2022-12-04 DIAGNOSIS — Z743 Need for continuous supervision: Secondary | ICD-10-CM | POA: Diagnosis not present

## 2022-12-04 DIAGNOSIS — R404 Transient alteration of awareness: Secondary | ICD-10-CM | POA: Diagnosis not present

## 2022-12-11 ENCOUNTER — Encounter: Payer: 59 | Admitting: Nurse Practitioner

## 2022-12-11 NOTE — Progress Notes (Signed)
This encounter was created in error - please disregard.

## 2022-12-20 DEATH — deceased

## 2023-11-12 ENCOUNTER — Encounter: Payer: 59 | Admitting: Nurse Practitioner
